# Patient Record
Sex: Female | Born: 1940
Health system: Southern US, Community
[De-identification: ages and names within clinical notes are randomized; demographics above are authoritative.]

## PROBLEM LIST (undated history)

## (undated) DIAGNOSIS — M1990 Unspecified osteoarthritis, unspecified site: Secondary | ICD-10-CM

## (undated) DIAGNOSIS — N841 Polyp of cervix uteri: Secondary | ICD-10-CM

## (undated) DIAGNOSIS — I10 Essential (primary) hypertension: Secondary | ICD-10-CM

## (undated) DIAGNOSIS — C189 Malignant neoplasm of colon, unspecified: Secondary | ICD-10-CM

## (undated) DIAGNOSIS — Z9289 Personal history of other medical treatment: Secondary | ICD-10-CM

## (undated) DIAGNOSIS — G629 Polyneuropathy, unspecified: Secondary | ICD-10-CM

## (undated) DIAGNOSIS — I5189 Other ill-defined heart diseases: Secondary | ICD-10-CM

## (undated) DIAGNOSIS — K458 Other specified abdominal hernia without obstruction or gangrene: Secondary | ICD-10-CM

## (undated) HISTORY — PX: COLECTOMY: SHX59

## (undated) HISTORY — PX: TONSILLECTOMY: SUR1361

## (undated) HISTORY — DX: Personal history of other medical treatment: Z92.89

## (undated) HISTORY — PX: SKIN CANCER EXCISION: SHX779

## (undated) HISTORY — PX: EYE SURGERY: SHX253

## (undated) HISTORY — DX: Polyp of cervix uteri: N84.1

## (undated) HISTORY — DX: Other ill-defined heart diseases: I51.89

## (undated) HISTORY — PX: OTHER SURGICAL HISTORY: SHX169

## (undated) HISTORY — DX: Malignant neoplasm of colon, unspecified: C18.9

## (undated) HISTORY — DX: Other specified abdominal hernia without obstruction or gangrene: K45.8

---

## 2007-04-26 DIAGNOSIS — C189 Malignant neoplasm of colon, unspecified: Secondary | ICD-10-CM

## 2007-04-26 HISTORY — DX: Malignant neoplasm of colon, unspecified: C18.9

## 2007-09-24 ENCOUNTER — Ambulatory Visit: Payer: Self-pay | Admitting: Internal Medicine

## 2007-10-05 ENCOUNTER — Other Ambulatory Visit: Payer: Self-pay

## 2007-10-06 ENCOUNTER — Inpatient Hospital Stay: Payer: Self-pay | Admitting: Vascular Surgery

## 2007-10-24 ENCOUNTER — Ambulatory Visit: Payer: Self-pay | Admitting: Internal Medicine

## 2007-10-26 ENCOUNTER — Inpatient Hospital Stay: Payer: Self-pay | Admitting: Vascular Surgery

## 2007-11-02 ENCOUNTER — Ambulatory Visit: Payer: Self-pay | Admitting: Internal Medicine

## 2007-11-13 ENCOUNTER — Ambulatory Visit: Payer: Self-pay | Admitting: Vascular Surgery

## 2007-11-24 ENCOUNTER — Ambulatory Visit: Payer: Self-pay | Admitting: Internal Medicine

## 2007-11-26 ENCOUNTER — Ambulatory Visit: Payer: Self-pay | Admitting: Gastroenterology

## 2007-11-29 ENCOUNTER — Ambulatory Visit: Payer: Self-pay | Admitting: Internal Medicine

## 2007-12-04 ENCOUNTER — Ambulatory Visit: Payer: Self-pay | Admitting: Internal Medicine

## 2007-12-25 ENCOUNTER — Ambulatory Visit: Payer: Self-pay | Admitting: Internal Medicine

## 2008-01-24 ENCOUNTER — Ambulatory Visit: Payer: Self-pay | Admitting: Internal Medicine

## 2008-02-24 ENCOUNTER — Ambulatory Visit: Payer: Self-pay | Admitting: Internal Medicine

## 2008-03-25 ENCOUNTER — Ambulatory Visit: Payer: Self-pay | Admitting: Gynecologic Oncology

## 2008-03-25 ENCOUNTER — Ambulatory Visit: Payer: Self-pay | Admitting: Internal Medicine

## 2008-04-01 ENCOUNTER — Ambulatory Visit: Payer: Self-pay | Admitting: Gynecologic Oncology

## 2008-04-02 ENCOUNTER — Ambulatory Visit: Payer: Self-pay | Admitting: Gynecologic Oncology

## 2008-04-25 ENCOUNTER — Ambulatory Visit: Payer: Self-pay | Admitting: Internal Medicine

## 2008-05-26 ENCOUNTER — Ambulatory Visit: Payer: Self-pay | Admitting: Internal Medicine

## 2008-06-23 ENCOUNTER — Ambulatory Visit: Payer: Self-pay | Admitting: Internal Medicine

## 2008-07-24 ENCOUNTER — Ambulatory Visit: Payer: Self-pay | Admitting: Internal Medicine

## 2008-08-23 ENCOUNTER — Ambulatory Visit: Payer: Self-pay | Admitting: Internal Medicine

## 2008-09-10 ENCOUNTER — Ambulatory Visit: Payer: Self-pay | Admitting: Vascular Surgery

## 2008-09-10 ENCOUNTER — Ambulatory Visit: Payer: Self-pay | Admitting: Cardiology

## 2008-09-17 ENCOUNTER — Inpatient Hospital Stay: Payer: Self-pay | Admitting: Vascular Surgery

## 2008-09-23 ENCOUNTER — Ambulatory Visit: Payer: Self-pay | Admitting: Internal Medicine

## 2008-10-23 ENCOUNTER — Ambulatory Visit: Payer: Self-pay | Admitting: Internal Medicine

## 2008-11-23 ENCOUNTER — Ambulatory Visit: Payer: Self-pay | Admitting: Internal Medicine

## 2008-12-15 ENCOUNTER — Ambulatory Visit: Payer: Self-pay | Admitting: Internal Medicine

## 2008-12-24 ENCOUNTER — Ambulatory Visit: Payer: Self-pay | Admitting: Internal Medicine

## 2009-01-23 ENCOUNTER — Ambulatory Visit: Payer: Self-pay | Admitting: Internal Medicine

## 2009-01-28 ENCOUNTER — Ambulatory Visit: Payer: Self-pay | Admitting: Gastroenterology

## 2009-02-23 ENCOUNTER — Ambulatory Visit: Payer: Self-pay | Admitting: Internal Medicine

## 2009-03-25 ENCOUNTER — Ambulatory Visit: Payer: Self-pay | Admitting: Internal Medicine

## 2009-04-25 ENCOUNTER — Ambulatory Visit: Payer: Self-pay | Admitting: Internal Medicine

## 2009-04-28 ENCOUNTER — Ambulatory Visit: Payer: Self-pay | Admitting: Internal Medicine

## 2009-05-02 ENCOUNTER — Ambulatory Visit: Payer: Self-pay | Admitting: Internal Medicine

## 2009-05-12 ENCOUNTER — Ambulatory Visit: Payer: Self-pay | Admitting: Gynecologic Oncology

## 2009-05-26 ENCOUNTER — Ambulatory Visit: Payer: Self-pay | Admitting: Internal Medicine

## 2009-06-16 ENCOUNTER — Ambulatory Visit: Payer: Self-pay | Admitting: Vascular Surgery

## 2009-06-23 ENCOUNTER — Ambulatory Visit: Payer: Self-pay | Admitting: Internal Medicine

## 2009-08-23 ENCOUNTER — Ambulatory Visit: Payer: Self-pay | Admitting: Internal Medicine

## 2009-08-31 ENCOUNTER — Ambulatory Visit: Payer: Self-pay | Admitting: Internal Medicine

## 2009-09-23 ENCOUNTER — Ambulatory Visit: Payer: Self-pay | Admitting: Internal Medicine

## 2009-12-10 ENCOUNTER — Ambulatory Visit: Payer: Self-pay | Admitting: Internal Medicine

## 2009-12-24 ENCOUNTER — Ambulatory Visit: Payer: Self-pay | Admitting: Internal Medicine

## 2010-01-23 ENCOUNTER — Ambulatory Visit: Payer: Self-pay | Admitting: Internal Medicine

## 2010-03-12 ENCOUNTER — Ambulatory Visit: Payer: Self-pay | Admitting: Internal Medicine

## 2010-03-14 LAB — CEA: CEA: 0.8 ng/mL (ref 0.0–4.7)

## 2010-03-25 ENCOUNTER — Ambulatory Visit: Payer: Self-pay | Admitting: Internal Medicine

## 2010-06-14 ENCOUNTER — Ambulatory Visit: Payer: Self-pay | Admitting: Internal Medicine

## 2010-06-15 LAB — CEA: CEA: 0.7 ng/mL (ref 0.0–4.7)

## 2010-06-24 ENCOUNTER — Ambulatory Visit: Payer: Self-pay | Admitting: Internal Medicine

## 2010-09-13 ENCOUNTER — Ambulatory Visit: Payer: Self-pay | Admitting: Internal Medicine

## 2010-09-14 LAB — CEA: CEA: 1 ng/mL (ref 0.0–4.7)

## 2010-09-24 ENCOUNTER — Ambulatory Visit: Payer: Self-pay | Admitting: Internal Medicine

## 2010-11-16 LAB — HM PAP SMEAR: HM Pap smear: NORMAL

## 2010-12-15 ENCOUNTER — Ambulatory Visit: Payer: Self-pay | Admitting: Internal Medicine

## 2010-12-16 LAB — CEA: CEA: 1 ng/mL (ref 0.0–4.7)

## 2010-12-17 ENCOUNTER — Encounter: Payer: Self-pay | Admitting: Internal Medicine

## 2010-12-17 ENCOUNTER — Ambulatory Visit (INDEPENDENT_AMBULATORY_CARE_PROVIDER_SITE_OTHER): Payer: Medicare Other | Admitting: Internal Medicine

## 2010-12-17 VITALS — BP 157/100 | HR 66 | Temp 97.9°F | Resp 16 | Ht 67.0 in | Wt 189.5 lb

## 2010-12-17 DIAGNOSIS — J329 Chronic sinusitis, unspecified: Secondary | ICD-10-CM

## 2010-12-17 DIAGNOSIS — I10 Essential (primary) hypertension: Secondary | ICD-10-CM

## 2010-12-17 MED ORDER — ENALAPRIL MALEATE 5 MG PO TABS: 5.0000 mg | ORAL_TABLET | Freq: Every day | ORAL | Status: DC

## 2010-12-17 MED ORDER — AMOXICILLIN-POT CLAVULANATE 875-125 MG PO TABS: 1.0000 | ORAL_TABLET | Freq: Two times a day (BID) | ORAL | Status: AC

## 2010-12-17 MED ORDER — BLOOD PRESSURE KIT: 1.0000 mL | PACK | Status: DC | PRN

## 2010-12-17 NOTE — Patient Instructions (Signed)
DASH diet.   Hypertension (High Blood Pressure) As your heart beats, it forces blood through your arteries. This force is your blood pressure. If the pressure is too high, it is called hypertension (HTN) or high blood pressure. HTN is dangerous because you may have it and not know it. High blood pressure may mean that your heart has to work harder to pump blood. Your arteries may be narrow or stiff. The extra work puts you at risk for heart disease, stroke, and other problems.  Blood pressure consists of two numbers, a higher number over a lower, 110/72, for example. It is stated as "110 over 72." The ideal is below 120 for the top number (systolic) and under 80 for the bottom (diastolic). Write down your blood pressure today. You should pay close attention to your blood pressure if you have certain conditions such as:  Heart failure.  Prior heart attack.   Diabetes   Chronic kidney disease.   Prior stroke.   Multiple risk factors for heart disease.   To see if you have HTN, your blood pressure should be measured while you are seated with your arm held at the level of the heart. It should be measured at least twice. A one-time elevated blood pressure reading (especially in the Emergency Department) does not mean that you need treatment. There may be conditions in which the blood pressure is different between your right and left arms. It is important to see your caregiver soon for a recheck. Most people have essential hypertension which means that there is not a specific cause. This type of high blood pressure may be lowered by changing lifestyle factors such as:  Stress.  Smoking.   Lack of exercise.   Excessive weight.  Drug/tobacco/alcohol use.   Eating less salt.   Most people do not have symptoms from high blood pressure until it has caused damage to the body. Effective treatment can often prevent, delay or reduce that damage. TREATMENT Treatment for high blood pressure, when a  cause has been identified, is directed at the cause. There are a large number of medications to treat HTN. These fall into several categories, and your caregiver will help you select the medicines that are best for you. Medications may have side effects. You should review side effects with your caregiver. If your blood pressure stays high after you have made lifestyle changes or started on medicines,   Your medication(s) may need to be changed.   Other problems may need to be addressed.   Be certain you understand your prescriptions, and know how and when to take your medicine.   Be sure to follow up with your caregiver within the time frame advised (usually within two weeks) to have your blood pressure rechecked and to review your medications.   If you are taking more than one medicine to lower your blood pressure, make sure you know how and at what times they should be taken. Taking two medicines at the same time can result in blood pressure that is too low.  SEEK IMMEDIATE MEDICAL CARE IF YOU DEVELOP:  A severe headache, blurred or changing vision, or confusion.   Unusual weakness or numbness, or a faint feeling.   Severe chest or abdominal pain, vomiting, or breathing problems.  MAKE SURE YOU:   Understand these instructions.   Will watch your condition.   Will get help right away if you are not doing well or get worse.  Document Released: 04/11/2005 Document Re-Released: 09/29/2009 [employer]  Patient Information 2011 [place], [place].

## 2010-12-18 ENCOUNTER — Encounter: Payer: Self-pay | Admitting: Internal Medicine

## 2010-12-18 DIAGNOSIS — I10 Essential (primary) hypertension: Secondary | ICD-10-CM | POA: Insufficient documentation

## 2010-12-18 NOTE — Progress Notes (Signed)
Subjective:    Patient ID: Sabrina Wilson, female    DOB: 12/12/1940, 70 y.o.   MRN: [id number]  Hypertension This is a new problem. The current episode started 1 to 4 weeks ago. The problem is unchanged. The problem is uncontrolled. Pertinent negatives include no anxiety, blurred vision, chest pain, headaches, malaise/fatigue, neck pain, palpitations, peripheral edema or shortness of breath. Associated agents: however has past exposure to chemotherapy. Risk factors for coronary artery disease include dyslipidemia and post-menopausal state. Past treatments include nothing. There are no compliance problems.   Sinus Problem This is a new problem. The current episode started 1 to 4 weeks ago. The problem is unchanged. There has been no fever. The pain is moderate. Associated symptoms include congestion and sinus pressure (and foul smell from nose). Pertinent negatives include no chills, coughing, ear pain, headaches, neck pain, shortness of breath or sore throat. (Foul smell noted, described as "burnt toast") Past treatments include nothing.     No outpatient prescriptions prior to visit.    Review of Systems  Constitutional: Negative for fever, chills, malaise/fatigue, appetite change, fatigue and unexpected weight change.  HENT: Positive for congestion, rhinorrhea and sinus pressure (and foul smell from nose). Negative for ear pain, sore throat, trouble swallowing, neck pain and voice change.   Eyes: Negative for blurred vision and visual disturbance.  Respiratory: Negative for cough, chest tightness, shortness of breath, wheezing and stridor.   Cardiovascular: Negative for chest pain, palpitations and leg swelling.  Gastrointestinal: Negative for nausea, vomiting, abdominal pain, diarrhea, constipation, blood in stool, abdominal distention and anal bleeding.  Genitourinary: Negative for dysuria and flank pain.  Musculoskeletal: Negative for myalgias, arthralgias and gait problem.  Skin:  Negative for color change and rash.  Neurological: Negative for dizziness and headaches.  Hematological: Negative for adenopathy. Does not bruise/bleed easily.  Psychiatric/Behavioral: Negative for suicidal ideas, sleep disturbance and dysphoric mood. The patient is not nervous/anxious.       BP 157/100  Pulse 66  Temp(Src) 97.9 F (36.6 C) (Oral)  Resp 16  Ht 5\' 7"  (1.702 m)  Wt 189 lb 8 oz (85.957 kg)  BMI 29.68 kg/m2  SpO2 99%     Objective:   Physical Exam  Constitutional: She is oriented to person, place, and time. She appears well-developed and well-nourished. No distress.  HENT:  Head: Normocephalic and atraumatic.  Right Ear: Hearing, tympanic membrane, external ear and ear canal normal.  Left Ear: Hearing, tympanic membrane, external ear and ear canal normal.  Nose: Right sinus exhibits frontal sinus tenderness. Left sinus exhibits frontal sinus tenderness.  Mouth/Throat: Oropharynx is clear and moist. No oropharyngeal exudate.  Eyes: Conjunctivae and EOM are normal. Pupils are equal, round, and reactive to light. Right eye exhibits no discharge. Left eye exhibits no discharge. No scleral icterus.  Neck: Trachea normal, normal range of motion and full passive range of motion without pain. Neck supple. Carotid bruit is not present. No tracheal deviation present. No thyromegaly present.  Cardiovascular: Normal rate, regular rhythm, normal heart sounds and intact distal pulses.  Exam reveals no gallop and no friction rub.   No murmur heard. Pulmonary/Chest: Effort normal and breath sounds normal. No respiratory distress. She has no wheezes. She has no rales. She exhibits no tenderness.  Musculoskeletal: Normal range of motion. She exhibits no edema and no tenderness.  Lymphadenopathy:    She has no cervical adenopathy.  Neurological: She is alert and oriented to person, place, and time. No cranial nerve deficit.  She exhibits normal muscle tone. Coordination normal.  Skin:  Skin is warm and dry. No rash noted. She is not diaphoretic. No erythema. No pallor.  Psychiatric: She has a normal mood and affect. Her behavior is normal. Judgment and thought content normal.          Assessment & Plan:  1. Hypertension - patient with a history of elevated blood pressure on several occasions. She notes that in the interval since her last visit she had markedly elevated blood pressure approximately 180/90 while at a dentist office. We'll plan to initiate therapy with enalapril. We discussed the potential risk of enalapril including cough. She will need to have creatinine and potassium checked in one week. She will call if she develops any problems with the medication. We will have her return to clinic in one week for a blood pressure check, and in one month for general followup.  2. Sinusitis - congestion, sinus pressure, and foul-smelling discharge are consistent with frontal sinusitis. Will treat with Augmentin x10 days and tylenol as needed for pain. She will call back in 5-7 days to let us know how her symptoms are progressing. Should she have no improvement on this regimen then we discussed referral to ENT.

## 2010-12-25 ENCOUNTER — Ambulatory Visit: Payer: Self-pay | Admitting: Internal Medicine

## 2011-01-19 ENCOUNTER — Ambulatory Visit (INDEPENDENT_AMBULATORY_CARE_PROVIDER_SITE_OTHER): Payer: Medicare Other | Admitting: Internal Medicine

## 2011-01-19 ENCOUNTER — Encounter: Payer: Self-pay | Admitting: Internal Medicine

## 2011-01-19 VITALS — BP 147/84 | HR 68 | Temp 98.4°F | Resp 16 | Ht 67.0 in | Wt 191.0 lb

## 2011-01-19 DIAGNOSIS — I10 Essential (primary) hypertension: Secondary | ICD-10-CM

## 2011-01-19 DIAGNOSIS — Z0000 Encounter for general adult medical examination without abnormal findings: Secondary | ICD-10-CM

## 2011-01-19 DIAGNOSIS — Z23 Encounter for immunization: Secondary | ICD-10-CM

## 2011-01-19 MED ORDER — ENALAPRIL MALEATE 5 MG PO TABS: 10.0000 mg | ORAL_TABLET | Freq: Every day | ORAL | Status: DC

## 2011-01-19 MED ORDER — ZOSTER VACCINE LIVE 19400 UNT/0.65ML ~~LOC~~ SOLR: 0.6500 mL | Freq: Once | SUBCUTANEOUS | Status: DC

## 2011-01-19 NOTE — Progress Notes (Signed)
  Subjective:    Patient ID: Sabrina Wilson, female    DOB: [birth year], 70 y.o.   MRN: [id number]  HPI Sabrina Wilson is a 70 year old female with a recent diagnosis of hypertension who presents for followup. At her last visit one month ago she was started on enalapril. She reports no problems with this medication. She has been monitoring her blood pressure at home and blood pressure has been running 140s to 160s over 70s to 90s for the most part. She notes however that she has been using a blood pressure cuff which is too small for her arm and this was corrected today. She denies any chest pain, palpitations, shortness of breath. She denies any cough with using the enalapril. She denies any other complaints today.  Outpatient Encounter Prescriptions as of 01/19/2011  Medication Sig Dispense Refill  . enalapril (VASOTEC) 5 MG tablet Take 2 tablets (10 mg total) by mouth daily.  60 tablet  11  . fish oil-omega-3 fatty acids 1000 MG capsule Take 2 g by mouth daily.        Marland Kitchen VITAMIN D, ERGOCALCIFEROL, PO 2 tablets daily.        Marland Kitchen DISCONTD: enalapril (VASOTEC) 5 MG tablet Take 1 tablet (5 mg total) by mouth daily.  30 tablet  11    Review of Systems  Constitutional: Negative for fever, chills, diaphoresis and fatigue.  Respiratory: Negative for cough.   Cardiovascular: Negative for chest pain, palpitations and leg swelling.  Gastrointestinal: Negative for abdominal pain.   BP 147/84  Pulse 68  Temp(Src) 98.4 F (36.9 C) (Oral)  Resp 16  Ht 5\' 7"  (1.702 m)  Wt 191 lb (86.637 kg)  BMI 29.91 kg/m2  SpO2 97%     Objective:   Physical Exam  Constitutional: She is oriented to person, place, and time. She appears well-developed and well-nourished. No distress.  HENT:  Head: Normocephalic and atraumatic.  Right Ear: External ear normal.  Left Ear: External ear normal.  Nose: Nose normal.  Mouth/Throat: Oropharynx is clear and moist. No oropharyngeal exudate.  Eyes: Conjunctivae are normal.  Pupils are equal, round, and reactive to light. Right eye exhibits no discharge. Left eye exhibits no discharge. No scleral icterus.  Neck: Normal range of motion. Neck supple. No tracheal deviation present. No thyromegaly present.  Cardiovascular: Normal rate, regular rhythm, normal heart sounds and intact distal pulses.  Exam reveals no gallop and no friction rub.   No murmur heard. Pulmonary/Chest: Effort normal and breath sounds normal. No respiratory distress. She has no wheezes. She has no rales. She exhibits no tenderness.  Musculoskeletal: Normal range of motion. She exhibits no edema and no tenderness.  Lymphadenopathy:    She has no cervical adenopathy.  Neurological: She is alert and oriented to person, place, and time. No cranial nerve deficit. She exhibits normal muscle tone. Coordination normal.  Skin: Skin is warm and dry. No rash noted. She is not diaphoretic. No erythema. No pallor.  Psychiatric: She has a normal mood and affect. Her behavior is normal. Judgment and thought content normal.          Assessment & Plan:

## 2011-01-19 NOTE — Patient Instructions (Signed)
Increase enalapril to 10mg  daily. Follow up in 1 month.

## 2011-01-19 NOTE — Assessment & Plan Note (Signed)
   Assessment:  BP persistently elevated on Enalapril 5mg  daily.    Plan:  Increase enalapril to 10mg  daily. Check Cr and K with labs today. Patient Education: Reviewed risks of hypertension and principles of  treatment.

## 2011-01-21 ENCOUNTER — Encounter: Payer: Self-pay | Admitting: Internal Medicine

## 2011-01-21 LAB — COMPREHENSIVE METABOLIC PANEL
ALT: 15 U/L (ref 0–35)
AST: 19 U/L (ref 0–37)
Alkaline Phosphatase: 55 U/L (ref 39–117)
Creatinine, Ser: 0.9 mg/dL (ref 0.4–1.2)
Total Bilirubin: 0.6 mg/dL (ref 0.3–1.2)

## 2011-01-27 ENCOUNTER — Other Ambulatory Visit: Payer: Self-pay | Admitting: Internal Medicine

## 2011-01-27 MED ORDER — ZOSTER VACCINE LIVE 19400 UNT/0.65ML ~~LOC~~ SOLR: 0.6500 mL | Freq: Once | SUBCUTANEOUS | Status: DC

## 2011-02-01 ENCOUNTER — Ambulatory Visit: Payer: Self-pay | Admitting: Internal Medicine

## 2011-02-07 ENCOUNTER — Ambulatory Visit: Payer: Self-pay | Admitting: Gastroenterology

## 2011-02-10 LAB — PATHOLOGY REPORT

## 2011-02-28 ENCOUNTER — Encounter: Payer: Self-pay | Admitting: Internal Medicine

## 2011-02-28 ENCOUNTER — Ambulatory Visit (INDEPENDENT_AMBULATORY_CARE_PROVIDER_SITE_OTHER): Payer: Medicare Other | Admitting: Internal Medicine

## 2011-02-28 ENCOUNTER — Telehealth: Payer: Self-pay | Admitting: Internal Medicine

## 2011-02-28 VITALS — BP 138/82 | HR 64 | Temp 98.3°F | Wt 190.0 lb

## 2011-02-28 DIAGNOSIS — I10 Essential (primary) hypertension: Secondary | ICD-10-CM

## 2011-02-28 DIAGNOSIS — J309 Allergic rhinitis, unspecified: Secondary | ICD-10-CM

## 2011-02-28 NOTE — Progress Notes (Signed)
Subjective:    Patient ID: Sabrina Wilson, female    DOB: [birth year], 70 y.o.   MRN: [id number]  HPI  70 year old female with a history of hypertension presents for followup. She notes that her blood pressure has been well controlled on the higher dose of enalapril at 10 mg daily. She does know 1 outlier at her dentist office where her blood pressure was elevated up to 170/90. Aside from this, she reports her blood pressure has been well-controlled. She denies any chest pain, headache, palpitations or other symptoms. She reports full compliance with her medication.  Over the last 2 months, she notes some increased nasal drainage and postnasal drip. She attributes this to allergies. She reports occasional sneezing. She denies any fever or chills. She reports drainage in the back of her throat which seems difficult to clear. She denies any shortness of breath. She does have occasional dry cough.  Outpatient Encounter Prescriptions as of 02/28/2011  Medication Sig Dispense Refill  . enalapril (VASOTEC) 5 MG tablet Take 2 tablets (10 mg total) by mouth daily.  60 tablet  11  . fish oil-omega-3 fatty acids 1000 MG capsule Take 2 g by mouth daily.        Marland Kitchen VITAMIN D, ERGOCALCIFEROL, PO 2 tablets daily.          Review of Systems  Constitutional: Negative for fever, chills, fatigue and unexpected weight change.  HENT: Positive for rhinorrhea, sneezing and postnasal drip. Negative for ear pain, congestion, sore throat, facial swelling, mouth sores, trouble swallowing, voice change and sinus pressure.   Eyes: Negative for pain, discharge, redness and visual disturbance.  Respiratory: Positive for cough. Negative for chest tightness, shortness of breath, wheezing and stridor.   Cardiovascular: Negative for chest pain, palpitations and leg swelling.  Musculoskeletal: Negative for myalgias and arthralgias.  Skin: Negative for color change and rash.  Neurological: Negative for dizziness, weakness,  light-headedness and headaches.  Hematological: Negative for adenopathy.   BP 138/82  Pulse 64  Temp(Src) 98.3 F (36.8 C) (Oral)  Wt 190 lb (86.183 kg)  SpO2 97%     Objective:   Physical Exam  Constitutional: She is oriented to person, place, and time. She appears well-developed and well-nourished. No distress.  HENT:  Head: Normocephalic and atraumatic.  Right Ear: Tympanic membrane and external ear normal.  Left Ear: Tympanic membrane and external ear normal.  Nose: Nose normal.  Mouth/Throat: Oropharynx is clear and moist. No oropharyngeal exudate.  Eyes: Conjunctivae are normal. Pupils are equal, round, and reactive to light. Right eye exhibits no discharge. Left eye exhibits no discharge. No scleral icterus.  Neck: Normal range of motion. Neck supple. No tracheal deviation present. No thyromegaly present.  Cardiovascular: Normal rate, regular rhythm, normal heart sounds and intact distal pulses.  Exam reveals no gallop and no friction rub.   No murmur heard. Pulmonary/Chest: Effort normal and breath sounds normal. No respiratory distress. She has no wheezes. She has no rales. She exhibits no tenderness.  Musculoskeletal: Normal range of motion. She exhibits no edema and no tenderness.  Lymphadenopathy:    She has no cervical adenopathy.  Neurological: She is alert and oriented to person, place, and time. No cranial nerve deficit. She exhibits normal muscle tone. Coordination normal.  Skin: Skin is warm and dry. No rash noted. She is not diaphoretic. No erythema. No pallor.  Psychiatric: She has a normal mood and affect. Her behavior is normal. Judgment and thought content normal.  Assessment & Plan:  1. Hypertension - BP well controlled today. She reports an elevated BP reading at dentist's office, but this was outlier. Suspect related to anxiety over procedure. Will continue to monitor BP. Follow up 1 month.  2. Allergic rhinitis - Will start Claritin and  Veramyst nasal steroid. Pt will call if symptoms not improving. If no improvement, will consider changing enalapril to Losartan as enalapril can cause chronic cough.

## 2011-02-28 NOTE — Telephone Encounter (Signed)
I was thinking more about patient's symptoms as I was writing my note.  Although her symptoms seem most consistent with allergic rhinitis, the time course of 1-2 month history of cough/nasal drainage would also fit for allergy to enalapril. We could try changing her enalapril to Losartan 50mg  daily to see if any improvement in her symptoms.

## 2011-02-28 NOTE — Patient Instructions (Signed)
Start Claritin 10mg  daily.  Start Veramyst sample, 1 spray each nostril at bedtime. Call if no improvement over next week.

## 2011-03-01 MED ORDER — LOSARTAN POTASSIUM 50 MG PO TABS: 50.0000 mg | ORAL_TABLET | Freq: Every day | ORAL | Status: DC

## 2011-03-01 NOTE — Telephone Encounter (Signed)
Left mess to call office back.   

## 2011-03-01 NOTE — Telephone Encounter (Signed)
Patient informed and willing to try new med. RX sent into pharm.

## 2011-03-14 ENCOUNTER — Encounter: Payer: Self-pay | Admitting: Internal Medicine

## 2011-03-21 ENCOUNTER — Ambulatory Visit: Payer: Self-pay | Admitting: Internal Medicine

## 2011-03-22 LAB — CEA: CEA: 0.9 ng/mL (ref 0.0–4.7)

## 2011-03-26 ENCOUNTER — Ambulatory Visit: Payer: Self-pay | Admitting: Internal Medicine

## 2011-04-01 ENCOUNTER — Ambulatory Visit (INDEPENDENT_AMBULATORY_CARE_PROVIDER_SITE_OTHER): Payer: Medicare Other | Admitting: Internal Medicine

## 2011-04-01 ENCOUNTER — Encounter: Payer: Self-pay | Admitting: Internal Medicine

## 2011-04-01 ENCOUNTER — Telehealth: Payer: Self-pay | Admitting: Internal Medicine

## 2011-04-01 VITALS — BP 161/94 | HR 74 | Temp 98.6°F | Wt 190.0 lb

## 2011-04-01 DIAGNOSIS — I10 Essential (primary) hypertension: Secondary | ICD-10-CM

## 2011-04-01 DIAGNOSIS — H6690 Otitis media, unspecified, unspecified ear: Secondary | ICD-10-CM

## 2011-04-01 DIAGNOSIS — H6692 Otitis media, unspecified, left ear: Secondary | ICD-10-CM

## 2011-04-01 MED ORDER — AMOXICILLIN-POT CLAVULANATE 875-125 MG PO TABS: 1.0000 | ORAL_TABLET | Freq: Two times a day (BID) | ORAL | Status: AC

## 2011-04-01 MED ORDER — LOSARTAN POTASSIUM 100 MG PO TABS: 100.0000 mg | ORAL_TABLET | Freq: Every day | ORAL | Status: DC

## 2011-04-01 NOTE — Progress Notes (Signed)
Subjective:    Patient ID: Sabrina Wilson, female    DOB: [birth year], 70 y.o.   MRN: [id number]  HPI 70 year old female with a history of hypertension presents for followup. She reports full compliance with her losartan. She denies any chest pain, headache, palpitations. She has not been regularly checking her blood pressure.  She is also concerned today about approximately one week history of nasal congestion and ear pain. She denies any fever or chills. She reports that the drainage from her sinuses is purulent. She has occasional nonproductive cough. She denies any shortness of breath.  Outpatient Encounter Prescriptions as of 04/01/2011  Medication Sig Dispense Refill  . fish oil-omega-3 fatty acids 1000 MG capsule Take 2 g by mouth daily.        Marland Kitchen losartan (COZAAR) 100 MG tablet Take 1 tablet (100 mg total) by mouth daily.  30 tablet  6  . VITAMIN D, ERGOCALCIFEROL, PO 2 tablets daily.          Review of Systems  Constitutional: Negative for fever, chills, appetite change, fatigue and unexpected weight change.  HENT: Positive for ear pain and congestion. Negative for sore throat, trouble swallowing, neck pain, voice change and sinus pressure.   Eyes: Negative for visual disturbance.  Respiratory: Positive for cough. Negative for shortness of breath and wheezing.   Cardiovascular: Negative for chest pain, palpitations and leg swelling.  Genitourinary: Negative for dysuria and flank pain.  Musculoskeletal: Negative for myalgias, arthralgias and gait problem.  Skin: Negative for color change and rash.  Neurological: Negative for dizziness and headaches.  Hematological: Negative for adenopathy. Does not bruise/bleed easily.   BP 161/94  Pulse 74  Temp 98.6 F (37 C)  Wt 190 lb (86.183 kg)  SpO2 100%     Objective:   Physical Exam  Constitutional: She is oriented to person, place, and time. She appears well-developed and well-nourished. No distress.  HENT:  Head:  Normocephalic and atraumatic.  Right Ear: External ear normal. Tympanic membrane is erythematous. A middle ear effusion is present.  Left Ear: External ear normal. Tympanic membrane is erythematous. A middle ear effusion is present.  Nose: Nose normal.  Mouth/Throat: Oropharynx is clear and moist. No oropharyngeal exudate.  Eyes: Conjunctivae are normal. Pupils are equal, round, and reactive to light. Right eye exhibits no discharge. Left eye exhibits no discharge. No scleral icterus.  Neck: Normal range of motion. Neck supple. No tracheal deviation present. No thyromegaly present.  Cardiovascular: Normal rate, regular rhythm, normal heart sounds and intact distal pulses.  Exam reveals no gallop and no friction rub.   No murmur heard. Pulmonary/Chest: Effort normal and breath sounds normal. No respiratory distress. She has no wheezes. She has no rales. She exhibits no tenderness.  Musculoskeletal: Normal range of motion. She exhibits no edema and no tenderness.  Lymphadenopathy:    She has no cervical adenopathy.  Neurological: She is alert and oriented to person, place, and time. No cranial nerve deficit. She exhibits normal muscle tone. Coordination normal.  Skin: Skin is warm and dry. No rash noted. She is not diaphoretic. No erythema. No pallor.  Psychiatric: She has a normal mood and affect. Her behavior is normal. Judgment and thought content normal.          Assessment & Plan:  1. Hypertension - blood pressure continues to be elevated. Will increase valsartan to 100 mg daily. We'll have her return to clinic in one month for repeat blood pressure check.  2. Otitis  media - symptoms and exam are consistent with otitis media. Will treat with a ten-day course of Augmentin. She will use ibuprofen or Tylenol as needed for pain. She will call or return to clinic should fever develop or should symptoms worsen. Otherwise, she will followup in one month.

## 2011-04-01 NOTE — Telephone Encounter (Signed)
Given that we are increasing losartan to 100mg  daily, I would like to check a BMP next week.  Can you ask her to drop by for this? Thanks

## 2011-04-04 NOTE — Telephone Encounter (Signed)
Left message asking patient to return my call.

## 2011-04-06 NOTE — Telephone Encounter (Signed)
Patient notified. She will come in on Monday for labs because she is tied up for the rest of this week.

## 2011-04-11 ENCOUNTER — Other Ambulatory Visit (INDEPENDENT_AMBULATORY_CARE_PROVIDER_SITE_OTHER): Payer: Medicare Other | Admitting: *Deleted

## 2011-04-11 DIAGNOSIS — I10 Essential (primary) hypertension: Secondary | ICD-10-CM

## 2011-04-12 LAB — BASIC METABOLIC PANEL
BUN: 18 mg/dL (ref 6–23)
CO2: 25 mEq/L (ref 19–32)
Chloride: 106 mEq/L (ref 96–112)
Creatinine, Ser: 1 mg/dL (ref 0.4–1.2)

## 2011-04-26 ENCOUNTER — Ambulatory Visit: Payer: Self-pay | Admitting: Internal Medicine

## 2011-05-04 ENCOUNTER — Ambulatory Visit: Payer: Medicare Other | Admitting: Internal Medicine

## 2011-05-12 ENCOUNTER — Ambulatory Visit (INDEPENDENT_AMBULATORY_CARE_PROVIDER_SITE_OTHER): Payer: Medicare Other | Admitting: Internal Medicine

## 2011-05-12 ENCOUNTER — Encounter: Payer: Self-pay | Admitting: Internal Medicine

## 2011-05-12 VITALS — BP 138/62 | HR 86 | Temp 98.0°F | Ht 67.0 in | Wt 190.0 lb

## 2011-05-12 DIAGNOSIS — I10 Essential (primary) hypertension: Secondary | ICD-10-CM

## 2011-05-12 NOTE — Assessment & Plan Note (Signed)
No further cough with change to Losartan. BP well controlled. Will repeat renal function and follow up in 6 months.

## 2011-05-12 NOTE — Progress Notes (Signed)
  Subjective:    Patient ID: Sabrina Wilson, female    DOB: 1941/01/07, 71 y.o.   MRN: [id number]  HPI 71YO female with h/o HTN presents for follow up. She reports she is feeling very well. She reports that her cough has resolved with change to losartan. She reports full compliance with her medication. She denies any complaints today.  In regards to her recent episode of left otitis media, she reports symptoms have completely resolved with treatment with augmentin. She has not had recurrent ear pain, fever, or other symptoms.  Outpatient Encounter Prescriptions as of 05/12/2011  Medication Sig Dispense Refill  . fish oil-omega-3 fatty acids 1000 MG capsule Take 2 g by mouth daily.        Marland Kitchen losartan (COZAAR) 100 MG tablet Take 1 tablet (100 mg total) by mouth daily.  30 tablet  6  . VITAMIN D, ERGOCALCIFEROL, PO 2 tablets daily.          Review of Systems  Constitutional: Negative for fever, chills, appetite change, fatigue and unexpected weight change.  HENT: Negative for ear pain, congestion, sore throat, trouble swallowing, neck pain, voice change and sinus pressure.   Eyes: Negative for visual disturbance.  Respiratory: Negative for cough, shortness of breath, wheezing and stridor.   Cardiovascular: Negative for chest pain, palpitations and leg swelling.  Gastrointestinal: Negative for nausea, vomiting, abdominal pain, diarrhea, constipation, blood in stool, abdominal distention and anal bleeding.  Genitourinary: Negative for dysuria and flank pain.  Musculoskeletal: Negative for myalgias, arthralgias and gait problem.  Skin: Negative for color change and rash.  Neurological: Negative for dizziness and headaches.  Hematological: Negative for adenopathy. Does not bruise/bleed easily.  Psychiatric/Behavioral: Negative for suicidal ideas, sleep disturbance and dysphoric mood. The patient is not nervous/anxious.    BP 138/62  Pulse 86  Temp(Src) 98 F (36.7 C) (Oral)  Ht 5\' 7"  (1.702  m)  Wt 190 lb (86.183 kg)  BMI 29.76 kg/m2  SpO2 97%     Objective:   Physical Exam  Constitutional: She is oriented to person, place, and time. She appears well-developed and well-nourished. No distress.  HENT:  Head: Normocephalic and atraumatic.  Right Ear: External ear normal.  Left Ear: External ear normal.  Nose: Nose normal.  Mouth/Throat: Oropharynx is clear and moist. No oropharyngeal exudate.  Eyes: Conjunctivae are normal. Pupils are equal, round, and reactive to light. Right eye exhibits no discharge. Left eye exhibits no discharge. No scleral icterus.  Neck: Normal range of motion. Neck supple. No tracheal deviation present. No thyromegaly present.  Cardiovascular: Normal rate, regular rhythm, normal heart sounds and intact distal pulses.  Exam reveals no gallop and no friction rub.   No murmur heard. Pulmonary/Chest: Effort normal and breath sounds normal. No respiratory distress. She has no wheezes. She has no rales. She exhibits no tenderness.  Musculoskeletal: Normal range of motion. She exhibits no edema and no tenderness.  Lymphadenopathy:    She has no cervical adenopathy.  Neurological: She is alert and oriented to person, place, and time. No cranial nerve deficit. She exhibits normal muscle tone. Coordination normal.  Skin: Skin is warm and dry. No rash noted. She is not diaphoretic. No erythema. No pallor.  Psychiatric: She has a normal mood and affect. Her behavior is normal. Judgment and thought content normal.          Assessment & Plan:

## 2011-06-22 ENCOUNTER — Ambulatory Visit: Payer: Self-pay | Admitting: Internal Medicine

## 2011-06-22 LAB — CREATININE, SERUM
Creatinine: 1.03 mg/dL (ref 0.60–1.30)
EGFR (African American): >60
EGFR (Non-African Amer.): 57 — ABNORMAL LOW

## 2011-06-22 LAB — HEPATIC FUNCTION PANEL A (ARMC)
Alkaline Phosphatase: 72 U/L (ref 50–136)
SGOT(AST): 14 U/L — ABNORMAL LOW (ref 15–37)
SGPT (ALT): 24 U/L
Total Protein: 7.5 g/dL (ref 6.4–8.2)

## 2011-06-24 ENCOUNTER — Ambulatory Visit: Payer: Self-pay | Admitting: Internal Medicine

## 2011-06-24 ENCOUNTER — Ambulatory Visit: Payer: Self-pay | Admitting: Gynecologic Oncology

## 2011-07-08 LAB — CA 125: CA 125: 27.9 U/mL (ref 0.0–34.0)

## 2011-07-20 LAB — CA 125: CA 125: 26.8 U/mL (ref 0.0–34.0)

## 2011-07-25 ENCOUNTER — Ambulatory Visit: Payer: Self-pay | Admitting: Internal Medicine

## 2011-07-25 ENCOUNTER — Ambulatory Visit: Payer: Self-pay | Admitting: Gynecologic Oncology

## 2011-09-16 ENCOUNTER — Ambulatory Visit: Payer: Self-pay | Admitting: Gynecologic Oncology

## 2011-09-20 ENCOUNTER — Ambulatory Visit: Payer: Self-pay | Admitting: Internal Medicine

## 2011-09-24 ENCOUNTER — Ambulatory Visit: Payer: Self-pay | Admitting: Internal Medicine

## 2011-11-03 ENCOUNTER — Ambulatory Visit: Payer: Self-pay | Admitting: Internal Medicine

## 2011-11-04 ENCOUNTER — Other Ambulatory Visit: Payer: Self-pay | Admitting: Internal Medicine

## 2011-11-04 LAB — CEA: CEA: 1 ng/mL (ref 0.0–4.7)

## 2011-11-05 ENCOUNTER — Other Ambulatory Visit: Payer: Self-pay | Admitting: Internal Medicine

## 2011-11-11 ENCOUNTER — Encounter: Payer: Self-pay | Admitting: Internal Medicine

## 2011-11-11 ENCOUNTER — Ambulatory Visit (INDEPENDENT_AMBULATORY_CARE_PROVIDER_SITE_OTHER): Payer: Medicare Other | Admitting: Internal Medicine

## 2011-11-11 VITALS — BP 140/90 | HR 73 | Temp 98.3°F | Ht 67.0 in | Wt 190.8 lb

## 2011-11-11 DIAGNOSIS — Z85038 Personal history of other malignant neoplasm of large intestine: Secondary | ICD-10-CM | POA: Insufficient documentation

## 2011-11-11 DIAGNOSIS — I10 Essential (primary) hypertension: Secondary | ICD-10-CM

## 2011-11-11 NOTE — Progress Notes (Signed)
Subjective:    Patient ID: Sabrina Wilson, female    DOB: 08-21-1940, 71 y.o.   MRN: [id number]  HPI 71 year old female with history of colon and cervical cancer as well as hypertension presents for followup. She reports she was recently seen by her oncologist and imaging and blood work were negative for recurrence of cancer. She reports she has been feeling well. She reports her blood pressure has been well controlled at home typically. She reports full compliance with her medications. She denies any chest pain, palpitations, headache.  Outpatient Encounter Prescriptions as of 11/11/2011  Medication Sig Dispense Refill  . fish oil-omega-3 fatty acids 1000 MG capsule Take 2 g by mouth daily.        Marland Kitchen losartan (COZAAR) 100 MG tablet TAKE 1 TABLET (100 MG TOTAL) BY MOUTH DAILY.  30 tablet  6  . VITAMIN D, ERGOCALCIFEROL, PO 2 tablets daily.        Marland Kitchen DISCONTD: losartan (COZAAR) 100 MG tablet TAKE 1 TABLET (100 MG TOTAL) BY MOUTH DAILY.  30 tablet  5    Review of Systems  Constitutional: Negative for fever, chills, appetite change, fatigue and unexpected weight change.  HENT: Negative for ear pain, congestion, sore throat, trouble swallowing, neck pain, voice change and sinus pressure.   Eyes: Negative for visual disturbance.  Respiratory: Negative for cough, shortness of breath, wheezing and stridor.   Cardiovascular: Negative for chest pain, palpitations and leg swelling.  Gastrointestinal: Negative for nausea, vomiting, abdominal pain, diarrhea, constipation, blood in stool, abdominal distention and anal bleeding.  Genitourinary: Negative for dysuria and flank pain.  Musculoskeletal: Negative for myalgias, arthralgias and gait problem.  Skin: Negative for color change and rash.  Neurological: Negative for dizziness and headaches.  Hematological: Negative for adenopathy. Does not bruise/bleed easily.  Psychiatric/Behavioral: Negative for suicidal ideas, disturbed wake/sleep cycle and  dysphoric mood. The patient is not nervous/anxious.    BP 140/90  Pulse 73  Temp 98.3 F (36.8 C) (Oral)  Ht 5\' 7"  (1.702 m)  Wt 190 lb 12 oz (86.524 kg)  BMI 29.88 kg/m2  SpO2 98%     Objective:   Physical Exam  Constitutional: She is oriented to person, place, and time. She appears well-developed and well-nourished. No distress.  HENT:  Head: Normocephalic and atraumatic.  Right Ear: External ear normal.  Left Ear: External ear normal.  Nose: Nose normal.  Mouth/Throat: Oropharynx is clear and moist. No oropharyngeal exudate.  Eyes: Conjunctivae are normal. Pupils are equal, round, and reactive to light. Right eye exhibits no discharge. Left eye exhibits no discharge. No scleral icterus.  Neck: Normal range of motion. Neck supple. No tracheal deviation present. No thyromegaly present.  Cardiovascular: Normal rate, regular rhythm, normal heart sounds and intact distal pulses.  Exam reveals no gallop and no friction rub.   No murmur heard. Pulmonary/Chest: Effort normal and breath sounds normal. No respiratory distress. She has no wheezes. She has no rales. She exhibits no tenderness.  Musculoskeletal: Normal range of motion. She exhibits no edema and no tenderness.  Lymphadenopathy:    She has no cervical adenopathy.  Neurological: She is alert and oriented to person, place, and time. No cranial nerve deficit. She exhibits normal muscle tone. Coordination normal.  Skin: Skin is warm and dry. No rash noted. She is not diaphoretic. No erythema. No pallor.  Psychiatric: She has a normal mood and affect. Her behavior is normal. Judgment and thought content normal.  Assessment & Plan:

## 2011-11-11 NOTE — Assessment & Plan Note (Signed)
Patient reports recent imaging and blood work has shown no evidence of recurrence of cancer. Will obtain records from oncologist.

## 2011-11-11 NOTE — Assessment & Plan Note (Signed)
Blood pressure slightly elevated today but has been well controlled historically. Will plan to continue losartan. Patient recently had renal function checked by her oncologist. Will obtain records on this. Followup 6 months.

## 2011-11-24 ENCOUNTER — Ambulatory Visit: Payer: Self-pay | Admitting: Internal Medicine

## 2011-12-16 ENCOUNTER — Telehealth: Payer: Self-pay | Admitting: Internal Medicine

## 2011-12-16 NOTE — Telephone Encounter (Signed)
Patient called in asking [place] to fax last labs and office notes to Dr. [name] he is going to see patient on Monday about hernia. I have faxed the notes and lab to [phone].  Dr. Dan Humphreys she wanted you to know that she was going to see him on Monday.

## 2012-02-02 ENCOUNTER — Ambulatory Visit: Payer: Self-pay | Admitting: Internal Medicine

## 2012-02-11 LAB — HM MAMMOGRAPHY: HM Mammogram: NORMAL

## 2012-02-29 ENCOUNTER — Ambulatory Visit: Payer: Self-pay | Admitting: Internal Medicine

## 2012-02-29 ENCOUNTER — Ambulatory Visit: Payer: Self-pay | Admitting: Gynecologic Oncology

## 2012-03-01 LAB — CEA: CEA: 0.6 ng/mL (ref 0.0–4.7)

## 2012-03-07 LAB — CA 125: CA 125: 25.6 U/mL (ref 0.0–34.0)

## 2012-03-14 ENCOUNTER — Encounter: Payer: Medicare Other | Admitting: Internal Medicine

## 2012-03-25 ENCOUNTER — Ambulatory Visit: Payer: Self-pay | Admitting: Internal Medicine

## 2012-03-26 ENCOUNTER — Ambulatory Visit (INDEPENDENT_AMBULATORY_CARE_PROVIDER_SITE_OTHER): Payer: Medicare Other

## 2012-03-26 DIAGNOSIS — Z23 Encounter for immunization: Secondary | ICD-10-CM

## 2012-04-12 ENCOUNTER — Encounter: Payer: Self-pay | Admitting: Internal Medicine

## 2012-04-12 ENCOUNTER — Ambulatory Visit (INDEPENDENT_AMBULATORY_CARE_PROVIDER_SITE_OTHER): Payer: Medicare Other | Admitting: Internal Medicine

## 2012-04-12 VITALS — BP 130/70 | HR 70 | Temp 98.6°F | Resp 16 | Ht 67.0 in | Wt 190.5 lb

## 2012-04-12 DIAGNOSIS — Z1331 Encounter for screening for depression: Secondary | ICD-10-CM

## 2012-04-12 DIAGNOSIS — L02219 Cutaneous abscess of trunk, unspecified: Secondary | ICD-10-CM

## 2012-04-12 DIAGNOSIS — L03319 Cellulitis of trunk, unspecified: Secondary | ICD-10-CM

## 2012-04-12 DIAGNOSIS — I10 Essential (primary) hypertension: Secondary | ICD-10-CM

## 2012-04-12 DIAGNOSIS — Z0000 Encounter for general adult medical examination without abnormal findings: Secondary | ICD-10-CM | POA: Insufficient documentation

## 2012-04-12 LAB — CBC WITH DIFFERENTIAL/PLATELET
Basophils Absolute: 0.1 10*3/uL (ref 0.0–0.1)
Basophils Relative: 1 % (ref 0.0–3.0)
Eosinophils Absolute: 0.4 10*3/uL (ref 0.0–0.7)
HCT: 38.7 % (ref 36.0–46.0)
Hemoglobin: 12.5 g/dL (ref 12.0–15.0)
Lymphocytes Relative: 26.8 % (ref 12.0–46.0)
Lymphs Abs: 2 10*3/uL (ref 0.7–4.0)
MCHC: 32.4 g/dL (ref 30.0–36.0)
MCV: 96.4 fl (ref 78.0–100.0)
Neutro Abs: 4.3 10*3/uL (ref 1.4–7.7)
RBC: 4.01 Mil/uL (ref 3.87–5.11)
RDW: 14.1 % (ref 11.5–14.6)

## 2012-04-12 LAB — COMPREHENSIVE METABOLIC PANEL
AST: 20 U/L (ref 0–37)
BUN: 18 mg/dL (ref 6–23)
Calcium: 9.7 mg/dL (ref 8.4–10.5)
Chloride: 104 mEq/L (ref 96–112)
Creatinine, Ser: 0.9 mg/dL (ref 0.4–1.2)
GFR: 65.11 mL/min (ref >60.00)
Total Bilirubin: 0.7 mg/dL (ref 0.3–1.2)

## 2012-04-12 LAB — MICROALBUMIN / CREATININE URINE RATIO
Creatinine,U: 96.5 mg/dL
Microalb Creat Ratio: 0.5 mg/g (ref 0.0–30.0)

## 2012-04-12 LAB — LIPID PANEL
Cholesterol: 237 mg/dL — ABNORMAL HIGH (ref 0–200)
VLDL: 14.2 mg/dL (ref 0.0–40.0)

## 2012-04-12 MED ORDER — DOXYCYCLINE HYCLATE 100 MG PO TABS: 100.0000 mg | ORAL_TABLET | Freq: Two times a day (BID) | ORAL | Status: DC

## 2012-04-12 MED ORDER — LOSARTAN POTASSIUM 100 MG PO TABS: 100.0000 mg | ORAL_TABLET | Freq: Every day | ORAL | Status: DC

## 2012-04-12 NOTE — Assessment & Plan Note (Signed)
General medical exam including breast exam normal today. Pelvic and Pap deferred as this was recently completed by her surgical oncologist who plans to proceed with oophorectomy next month, given recent abnormal area in the right adnexa. Health maintenance is up to date. Encourage continued effort at healthy diet and regular physical activity. Appropriate screening performed. Labs today, CBC, CMP, lipids, TSH. Followup for wellness visit in one year.

## 2012-04-12 NOTE — Assessment & Plan Note (Signed)
Lesion is most consistent with underlying sebaceous cyst with overlying abscess and cellulitis. After obtaining informed consent from the patient, area was prepared using Betadine. Area was anesthetized with 1 ML of lidocaine without epi. A small incision was made over the pustule using a scalpel. Small amount of purulent fluid was drained.  Pt tolerated the procedure well with no immediate complications noted. No blood loss noted. We discussed that this area will likely require further excision of the underlying cystic area. Recommended that she discuss this with her surgeon. Will also start treatment for surrounding cellulitis with Doxycycline 100mg  po bid x 10 days. Follow up if any persistent or worsening pain, fever, chills.

## 2012-04-12 NOTE — Progress Notes (Signed)
Subjective:    Patient ID: Sabrina Wilson, female    DOB: [birth year], 71 y.o.   MRN: [id number]  HPI The patient is here for annual Medicare wellness examination and management of other chronic and acute problems.   The risk factors are reflected in the social history.  The roster of all physicians providing medical care to patient - is listed in the Snapshot section of the chart.  Activities of daily living:  The patient is 100% independent in all ADLs: dressing, toileting, feeding as well as independent mobility  Home safety : The patient has smoke detectors in the home. They wear seatbelts.  There are no firearms at home. There is no violence in the home.   There is no risks for hepatitis, STDs or HIV. There is a history of blood transfusion during cancer treatment. They have no travel history to infectious disease endemic areas of the world.  The patient has seen their dentist in the last six month. [facility]) They have seen their eye doctor in the last year. [facility]) No issues with hearing. They have deferred audiologic testing in the last year.   They do not  have excessive sun exposure. Discussed the need for sun protection: hats, long sleeves and use of sunscreen if there is significant sun exposure. Dermatologist - Dr. Gwen Pounds  Diet: the importance of a healthy diet is discussed. They do have a healthy diet.  The benefits of regular aerobic exercise were discussed. She walks occasionally.  Depression screen: there are no signs or vegative symptoms of depression- irritability, change in appetite, anhedonia, sadness/tearfullness.  Cognitive assessment: the patient manages all their financial and personal affairs and is actively engaged. They could relate day,date,year and events.  The following portions of the patient's history were reviewed and updated as appropriate: allergies, current medications, past family history, past medical history,  past surgical  history, past social history  and problem list.  Visual acuity was not assessed per patient preference since she has regular follow up with her ophthalmologist. Hearing and body mass index were assessed and reviewed.   During the course of the visit the patient was educated and counseled about appropriate screening and preventive services including : fall prevention , diabetes screening, nutrition counseling, colorectal cancer screening, and recommended immunizations.    Patient is also concerned today about a painful lesion on her left upper back. She reports that she showed this to her dermatologist several months ago and he was not concerned about it. He decided not to biopsy the lesion or start any medical treatment. Over the last couple of days, the lesion has gotten larger and more painful. It is irritated by her bra strap. She denies any drainage from the area. She denies any fever or chills.   Outpatient Encounter Prescriptions as of 04/12/2012  Medication Sig Dispense Refill  . fish oil-omega-3 fatty acids 1000 MG capsule Take 2 g by mouth daily.        Marland Kitchen losartan (COZAAR) 100 MG tablet Take 1 tablet (100 mg total) by mouth daily.  90 tablet  4  . VITAMIN D, ERGOCALCIFEROL, PO 2 tablets daily.        . [DISCONTINUED] losartan (COZAAR) 100 MG tablet TAKE 1 TABLET (100 MG TOTAL) BY MOUTH DAILY.  30 tablet  6  . doxycycline (VIBRA-TABS) 100 MG tablet Take 1 tablet (100 mg total) by mouth 2 (two) times daily.  20 tablet  0   BP 130/70  Pulse  70  Temp 98.6 F (37 C) (Oral)  Resp 16  Ht 5\' 7"  (1.702 m)  Wt 190 lb 8 oz (86.41 kg)  BMI 29.84 kg/m2  SpO2 97%  Review of Systems  Constitutional: Negative for fever, chills, appetite change, fatigue and unexpected weight change.  HENT: Negative for ear pain, congestion, sore throat, trouble swallowing, neck pain, voice change and sinus pressure.   Eyes: Negative for visual disturbance.  Respiratory: Negative for cough, shortness of breath,  wheezing and stridor.   Cardiovascular: Negative for chest pain, palpitations and leg swelling.  Gastrointestinal: Negative for nausea, vomiting, abdominal pain, diarrhea, constipation, blood in stool, abdominal distention and anal bleeding.  Genitourinary: Negative for dysuria and flank pain.  Musculoskeletal: Negative for myalgias, arthralgias and gait problem.  Skin: Positive for color change. Negative for rash.  Neurological: Negative for dizziness and headaches.  Hematological: Negative for adenopathy. Does not bruise/bleed easily.  Psychiatric/Behavioral: Negative for suicidal ideas, sleep disturbance and dysphoric mood. The patient is not nervous/anxious.        Objective:   Physical Exam  Constitutional: She is oriented to person, place, and time. She appears well-developed and well-nourished. No distress.  HENT:  Head: Normocephalic and atraumatic.  Right Ear: External ear normal.  Left Ear: External ear normal.  Nose: Nose normal.  Mouth/Throat: Oropharynx is clear and moist. No oropharyngeal exudate.  Eyes: Conjunctivae normal are normal. Pupils are equal, round, and reactive to light. Right eye exhibits no discharge. Left eye exhibits no discharge. No scleral icterus.  Neck: Normal range of motion. Neck supple. No tracheal deviation present. No thyromegaly present.  Cardiovascular: Normal rate, regular rhythm, normal heart sounds and intact distal pulses.  Exam reveals no gallop and no friction rub.   No murmur heard. Pulmonary/Chest: Effort normal and breath sounds normal. No accessory muscle usage. Not tachypneic. No respiratory distress. She has no wheezes. She has no rales. She exhibits no tenderness. Right breast exhibits no inverted nipple, no mass, no nipple discharge, no skin change and no tenderness. Left breast exhibits no inverted nipple, no mass, no nipple discharge, no skin change and no tenderness. Breasts are symmetrical.  Abdominal: Soft. Bowel sounds are normal.  She exhibits no distension and no mass. There is no tenderness. There is no rebound and no guarding.  Musculoskeletal: Normal range of motion. She exhibits no edema and no tenderness.  Lymphadenopathy:    She has no cervical adenopathy.  Neurological: She is alert and oriented to person, place, and time. No cranial nerve deficit. She exhibits normal muscle tone. Coordination normal.  Skin: Skin is warm and dry. Lesion noted. No rash noted. She is not diaphoretic. There is erythema. No pallor.     Psychiatric: She has a normal mood and affect. Her behavior is normal. Judgment and thought content normal.          Assessment & Plan:

## 2012-04-12 NOTE — Addendum Note (Signed)
Addended by: Ronna Polio A on: 04/12/2012 05:47 PM   Modules accepted: Orders

## 2012-04-25 HISTORY — PX: DILATION AND CURETTAGE OF UTERUS: SHX78

## 2012-05-03 ENCOUNTER — Ambulatory Visit: Payer: Self-pay | Admitting: Internal Medicine

## 2012-05-26 ENCOUNTER — Ambulatory Visit: Payer: Self-pay | Admitting: Internal Medicine

## 2012-06-12 ENCOUNTER — Ambulatory Visit: Payer: Self-pay | Admitting: General Surgery

## 2012-06-23 ENCOUNTER — Ambulatory Visit: Payer: Self-pay | Admitting: Gynecologic Oncology

## 2012-06-25 ENCOUNTER — Ambulatory Visit: Payer: Self-pay | Admitting: General Surgery

## 2012-06-25 LAB — CBC WITH DIFFERENTIAL/PLATELET
Basophil #: 0.1 10*3/uL (ref 0.0–0.1)
Basophil %: 1.2 %
Eosinophil #: 0.4 10*3/uL (ref 0.0–0.7)
Eosinophil %: 5.9 %
HCT: 37.6 % (ref 35.0–47.0)
Lymphocyte #: 1.5 10*3/uL (ref 1.0–3.6)
Lymphocyte %: 23.8 %
MCH: 32.5 pg (ref 26.0–34.0)
MCHC: 33.1 g/dL (ref 32.0–36.0)
Monocyte %: 9.1 %
Neutrophil %: 60 %
RDW: 13.6 % (ref 11.5–14.5)
WBC: 6.3 10*3/uL (ref 3.6–11.0)

## 2012-06-25 LAB — BASIC METABOLIC PANEL
Anion Gap: 2 — ABNORMAL LOW (ref 7–16)
Creatinine: 0.93 mg/dL (ref 0.60–1.30)
EGFR (Non-African Amer.): >60
Sodium: 139 mmol/L (ref 136–145)

## 2012-06-25 LAB — PATHOLOGY REPORT

## 2012-06-28 ENCOUNTER — Ambulatory Visit: Payer: Self-pay | Admitting: Internal Medicine

## 2012-07-03 ENCOUNTER — Ambulatory Visit: Payer: Self-pay | Admitting: Gynecologic Oncology

## 2012-07-05 LAB — PATHOLOGY REPORT

## 2012-07-13 ENCOUNTER — Inpatient Hospital Stay: Payer: Self-pay | Admitting: General Surgery

## 2012-07-13 DIAGNOSIS — K436 Other and unspecified ventral hernia with obstruction, without gangrene: Secondary | ICD-10-CM

## 2012-07-13 HISTORY — PX: HERNIA REPAIR: SHX51

## 2012-07-14 LAB — CREATININE, SERUM
EGFR (African American): >60
EGFR (Non-African Amer.): >60

## 2012-07-15 LAB — CBC WITH DIFFERENTIAL/PLATELET
Basophil #: 0 10*3/uL (ref 0.0–0.1)
Basophil %: 0.3 %
Eosinophil #: 0.2 10*3/uL (ref 0.0–0.7)
Eosinophil %: 1.6 %
Lymphocyte #: 0.7 10*3/uL — ABNORMAL LOW (ref 1.0–3.6)
Lymphocyte %: 5.7 %
MCH: 31.9 pg (ref 26.0–34.0)
MCV: 97 fL (ref 80–100)
Monocyte #: 1.1 x10 3/mm — ABNORMAL HIGH (ref 0.2–0.9)
Monocyte %: 8.7 %
Neutrophil #: 11 10*3/uL — ABNORMAL HIGH (ref 1.4–6.5)
Neutrophil %: 83.7 %
Platelet: 199 10*3/uL (ref 150–440)
RDW: 13 % (ref 11.5–14.5)
WBC: 13.1 10*3/uL — ABNORMAL HIGH (ref 3.6–11.0)

## 2012-07-15 LAB — BASIC METABOLIC PANEL
BUN: 10 mg/dL (ref 7–18)
Chloride: 100 mmol/L (ref 98–107)
Creatinine: 0.68 mg/dL (ref 0.60–1.30)
EGFR (African American): >60
EGFR (Non-African Amer.): >60
Osmolality: 268 (ref 275–301)
Sodium: 133 mmol/L — ABNORMAL LOW (ref 136–145)

## 2012-07-16 DIAGNOSIS — I359 Nonrheumatic aortic valve disorder, unspecified: Secondary | ICD-10-CM

## 2012-07-18 LAB — CBC WITH DIFFERENTIAL/PLATELET
Basophil %: 1.2 %
HCT: 30.1 % — ABNORMAL LOW (ref 35.0–47.0)
Lymphocyte #: 1.1 10*3/uL (ref 1.0–3.6)
Lymphocyte %: 18.3 %
MCH: 31.4 pg (ref 26.0–34.0)
Monocyte #: 0.7 x10 3/mm (ref 0.2–0.9)
Monocyte %: 10.9 %
Neutrophil %: 61.8 %
Platelet: 280 10*3/uL (ref 150–440)
RBC: 3.11 10*6/uL — ABNORMAL LOW (ref 3.80–5.20)
RDW: 12.9 % (ref 11.5–14.5)

## 2012-07-18 LAB — POTASSIUM: Potassium: 3.8 mmol/L (ref 3.5–5.1)

## 2012-07-20 ENCOUNTER — Telehealth: Payer: Self-pay | Admitting: Internal Medicine

## 2012-07-20 ENCOUNTER — Ambulatory Visit (INDEPENDENT_AMBULATORY_CARE_PROVIDER_SITE_OTHER): Payer: Medicare Other | Admitting: *Deleted

## 2012-07-20 ENCOUNTER — Ambulatory Visit: Payer: Self-pay

## 2012-07-20 DIAGNOSIS — K436 Other and unspecified ventral hernia with obstruction, without gangrene: Secondary | ICD-10-CM

## 2012-07-20 NOTE — Patient Instructions (Signed)
Pt to return as scheduled. 

## 2012-07-20 NOTE — Telephone Encounter (Addendum)
Been checking her email and it said something about an appointment. She has been in the hospital, she had surgery last Friday. Patient stated she received an email from this office but there is nothing in the system about an email. But if she needs an appointment then please send it through mychart.

## 2012-07-20 NOTE — Progress Notes (Signed)
This pt is seen today for staple removal from abdominal hernia repair done 07/13/2012. Pt is tolerating her diet, voiding and moving bowels. She states that she is doing well. Midline staple removed with minimal discomfort to pt. Incision with skin edges intact. Tinc of benzoin and 1/2 inch steri strips applied. She is to return to office on 07/26/12 @ 1330 with Dr. [name]. L.Feeney RN

## 2012-07-20 NOTE — Telephone Encounter (Signed)
Left message on voice mail wanting someone to call her back

## 2012-07-24 ENCOUNTER — Ambulatory Visit: Payer: Self-pay | Admitting: Internal Medicine

## 2012-07-24 ENCOUNTER — Ambulatory Visit: Payer: Self-pay | Admitting: Gynecologic Oncology

## 2012-07-24 NOTE — Telephone Encounter (Signed)
Spoke with pt.  Pt stated it was from byrnetts office.    Pt stated she would call back to schedule her appointment in june

## 2012-07-24 NOTE — Telephone Encounter (Signed)
She just called and stated there was an email from this office in reference to an appointment however I did not see anything about that. She does suppose to have a 6 month follow up appointment scheduled for June this year, that is the only thing I see.

## 2012-07-24 NOTE — Telephone Encounter (Signed)
Does pt need follow??

## 2012-07-26 ENCOUNTER — Ambulatory Visit (INDEPENDENT_AMBULATORY_CARE_PROVIDER_SITE_OTHER): Payer: Medicare Other | Admitting: General Surgery

## 2012-07-26 ENCOUNTER — Encounter: Payer: Self-pay | Admitting: General Surgery

## 2012-07-26 VITALS — BP 148/78 | HR 58 | Resp 12 | Ht 67.0 in | Wt 185.0 lb

## 2012-07-26 DIAGNOSIS — K439 Ventral hernia without obstruction or gangrene: Secondary | ICD-10-CM | POA: Insufficient documentation

## 2012-07-26 DIAGNOSIS — K436 Other and unspecified ventral hernia with obstruction, without gangrene: Secondary | ICD-10-CM

## 2012-07-26 DIAGNOSIS — D649 Anemia, unspecified: Secondary | ICD-10-CM

## 2012-07-26 NOTE — Progress Notes (Signed)
**Note Sabrina-Identified via Obfuscation** Patient ID: Sabrina Wilson, female   DOB: May 01, 1940, 72 y.o.   MRN: [id number]  Chief Complaint  Patient presents with  . Incisional Hernia    postop evaluation    HPI Sabrina Wilson is a 72 y.o. female here today for her post op ventral hernia repair done 07/13/12.Patient use heat off and on as needed to help with any pain. She has not required any narcotics in the past 4 days. The patient underwent component separation of the left abdominal wall with placement of a 25 x 30 cm soft Prolene mesh for reinforcement of her 4 abdominal wall hernias.  On postoperative day 2 she developed shortness of breath. CT of the chest was negative for PE. Atelectasis was identified. Moderate anemia with a hemoglobin of 10 was appreciated thought secondary to dilution.  The patient reports that she's had a good appetite, normal bowel movements and no difficulty with urination.  Original plans for ovary removal at the time of her hernia repair was not completed because of the extensive pelvic adhesions. HPI  Past Medical History  Diagnosis Date  . Colon cancer 2009    s/p resection and chemotherapy by Dr. Neale Burly  . Polyp at cervical os     s/p resection Dr. Hyacinth Meeker  . Hernia of flank     Past Surgical History  Procedure Laterality Date  . Colectomy    . Hernia repair  07/13/12    ventral     Family History  Problem Relation Age of Onset  . Stroke Mother   . Diabetes Mother   . Cancer Father     colon  . Cancer Brother     colon  . Cancer Brother     brain    Social History History  Substance Use Topics  . Smoking status: Former Smoker    Quit date: 10/12/2007  . Smokeless tobacco: Never Used  . Alcohol Use: 0.5 oz/week    1 drink(s) per week     Comment: with dinner    Allergies  Allergen Reactions  . Sulfa Antibiotics Hives    Current Outpatient Prescriptions  Medication Sig Dispense Refill  . doxycycline (VIBRA-TABS) 100 MG tablet Take 1 tablet (100 mg total) by mouth  2 (two) times daily.  20 tablet  0  . fish oil-omega-3 fatty acids 1000 MG capsule Take 2 g by mouth daily.        Marland Kitchen losartan (COZAAR) 100 MG tablet Take 1 tablet (100 mg total) by mouth daily.  90 tablet  4  . VITAMIN D, ERGOCALCIFEROL, PO 2 tablets daily.         No current facility-administered medications for this visit.    Review of Systems Review of Systems  Constitutional: Negative.   Respiratory: Negative.   Cardiovascular: Negative.     Blood pressure 148/78, pulse 58, resp. rate 12, height 5\' 7"  (1.702 m), weight 185 lb (83.915 kg).  Physical Exam Physical Exam  Constitutional: She appears well-developed.  Neck: Normal range of motion. Neck supple.  Cardiovascular: Normal rate, regular rhythm and normal heart sounds.   Occasional premature beats.  Pulmonary/Chest: Effort normal and breath sounds normal.  Abdominal: Soft. Normal appearance and bowel sounds are normal. There is no guarding.  Ventral hernia repairs healing well. When she elevates her legs off the examining table and when examined in the standing position no abdominal wall weakness was appreciated. No evidence of loculated fluid. Minimal skin indentation in the left lower quadrant from the  transfacial suture.    Data Reviewed None.  Assessment    Doing well status post abdominal wall reconstruction with component separation technique.  Postoperative anemia, likely secondary to dilution. A repeat CBC will be obtained today.     Plan    The patient will increase her activities as tolerated but refrain from strenuous lifting.        [name] 07/26/2012, 10:07 PM

## 2012-07-26 NOTE — Patient Instructions (Addendum)
The patient is aware to use a heating pad as needed for comfort.  

## 2012-07-27 LAB — CBC WITH DIFFERENTIAL/PLATELET
Basophils Absolute: 0.1 10*3/uL (ref 0.0–0.2)
Basos: 1 % (ref 0–3)
Eos: 4 % (ref 0–5)
Eosinophils Absolute: 0.4 10*3/uL (ref 0.0–0.4)
HCT: 37.2 % (ref 34.0–46.6)
Hemoglobin: 12.4 g/dL (ref 11.1–15.9)
Immature Grans (Abs): 0 10*3/uL (ref 0.0–0.1)
Immature Granulocytes: 0 % (ref 0–2)
Lymphocytes Absolute: 2.1 10*3/uL (ref 0.7–3.1)
Lymphs: 20 % (ref 14–46)
MCH: 31.9 pg (ref 26.6–33.0)
MCHC: 33.3 g/dL (ref 31.5–35.7)
MCV: 96 fL (ref 79–97)
Monocytes Absolute: 0.8 10*3/uL (ref 0.1–0.9)
Monocytes: 8 % (ref 4–12)
Neutrophils Absolute: 6.9 10*3/uL (ref 1.4–7.0)
Neutrophils Relative %: 67 % (ref 40–74)
RBC: 3.89 x10E6/uL (ref 3.77–5.28)
RDW: 13.5 % (ref 12.3–15.4)
WBC: 10.3 10*3/uL (ref 3.4–10.8)

## 2012-07-27 NOTE — Progress Notes (Signed)
Quick Note:  Hemoglobin has returned to baseline. Normal WBC and differential. ______

## 2012-08-15 ENCOUNTER — Encounter: Payer: Self-pay | Admitting: General Surgery

## 2012-08-15 ENCOUNTER — Ambulatory Visit (INDEPENDENT_AMBULATORY_CARE_PROVIDER_SITE_OTHER): Payer: Medicare Other | Admitting: General Surgery

## 2012-08-15 VITALS — BP 144/80 | HR 76 | Resp 14 | Ht 67.0 in | Wt 178.0 lb

## 2012-08-15 DIAGNOSIS — K439 Ventral hernia without obstruction or gangrene: Secondary | ICD-10-CM

## 2012-08-15 NOTE — Progress Notes (Signed)
Patient ID: Sabrina Wilson, female   DOB: May 10, 1940, 72 y.o.   MRN: [id number]  Chief Complaint  Patient presents with  . Routine Post Op    ventral hernia repair    HPI Sabrina Wilson is a 72 y.o. female who presents for post op ventral hernia repair. The procedure was performed on 07/13/12. The patient is recovering well. The only complaint is a little bit of upper abdomen soreness.  HPI  Past Medical History  Diagnosis Date  . Colon cancer 2009    s/p resection and chemotherapy by Dr. Neale Burly  . Polyp at cervical os     s/p resection Dr. Hyacinth Meeker  . Hernia of flank     Past Surgical History  Procedure Laterality Date  . Colectomy    . Hernia repair  07/13/12    ventral     Family History  Problem Relation Age of Onset  . Stroke Mother   . Diabetes Mother   . Cancer Father     colon  . Cancer Brother     colon  . Cancer Brother     brain    Social History History  Substance Use Topics  . Smoking status: Former Smoker    Quit date: 10/12/2007  . Smokeless tobacco: Never Used  . Alcohol Use: 0.5 oz/week    1 drink(s) per week     Comment: with dinner    Allergies  Allergen Reactions  . Sulfa Antibiotics Hives    Current Outpatient Prescriptions  Medication Sig Dispense Refill  . doxycycline (VIBRA-TABS) 100 MG tablet Take 1 tablet (100 mg total) by mouth 2 (two) times daily.  20 tablet  0  . fish oil-omega-3 fatty acids 1000 MG capsule Take 2 g by mouth daily.        Marland Kitchen losartan (COZAAR) 100 MG tablet Take 1 tablet (100 mg total) by mouth daily.  90 tablet  4  . VITAMIN D, ERGOCALCIFEROL, PO 2 tablets daily.         No current facility-administered medications for this visit.    Review of Systems Review of Systems  Constitutional: Negative.   Respiratory: Negative.   Cardiovascular: Negative.   Gastrointestinal: Negative.     Blood pressure 144/80, pulse 76, resp. rate 14, height 5\' 7"  (1.702 m), weight 178 lb (80.74 kg).  Physical  Exam Physical Exam  Constitutional: She appears well-developed and well-nourished.  Abdominal: Soft. Normal appearance and bowel sounds are normal. There is no hepatosplenomegaly. No hernia.  Excellent healing of the incision without evidence of loculated fluid is noted.  Data Reviewed None.  Assessment    Doing well status post ventral hernia repair.     Plan    Judicious strenuous physical activity as been recommended. We'll arrange for a followup exam in 3 months.        Sabrina Wilson 08/15/2012, 10:12 PM

## 2012-08-15 NOTE — Patient Instructions (Addendum)
Patient to return in 3 months. Resume daily activities with caution when lifting, pushing, or pulling.

## 2012-09-04 ENCOUNTER — Ambulatory Visit: Payer: Medicare Other | Admitting: General Surgery

## 2012-10-24 ENCOUNTER — Ambulatory Visit: Payer: Self-pay | Admitting: Internal Medicine

## 2012-11-10 LAB — CEA: CEA: 0.9 ng/mL (ref 0.0–4.7)

## 2012-11-15 ENCOUNTER — Encounter: Payer: Self-pay | Admitting: General Surgery

## 2012-11-15 ENCOUNTER — Ambulatory Visit (INDEPENDENT_AMBULATORY_CARE_PROVIDER_SITE_OTHER): Payer: Medicare Other | Admitting: General Surgery

## 2012-11-15 VITALS — BP 134/70 | HR 84 | Resp 14 | Ht 67.0 in | Wt 189.0 lb

## 2012-11-15 DIAGNOSIS — K439 Ventral hernia without obstruction or gangrene: Secondary | ICD-10-CM

## 2012-11-15 NOTE — Progress Notes (Signed)
Patient ID: Sabrina Wilson, female   DOB: 04/04/1941, 72 y.o.   MRN: [id number]  Chief Complaint  Patient presents with  . Follow-up    3 month follow up hernia repair    HPI Sabrina Wilson is a 73 y.o. female who presents for a 3 month follow up hernia repair. The procedure was performed on 07/13/12. The patient is doing well overall. She states she still has some tenderness with movement.   HPI  Past Medical History  Diagnosis Date  . Colon cancer 2009    s/p resection and chemotherapy by Dr. Neale Burly  . Polyp at cervical os     s/p resection Dr. Hyacinth Meeker  . Hernia of flank     Past Surgical History  Procedure Laterality Date  . Colectomy    . Hernia repair  07/13/12    ventral     Family History  Problem Relation Age of Onset  . Stroke Mother   . Diabetes Mother   . Cancer Father     colon  . Cancer Brother     colon  . Cancer Brother     brain    Social History History  Substance Use Topics  . Smoking status: Former Smoker    Quit date: 10/12/2007  . Smokeless tobacco: Never Used  . Alcohol Use: 0.5 oz/week    1 drink(s) per week     Comment: with dinner    Allergies  Allergen Reactions  . Sulfa Antibiotics Hives    Current Outpatient Prescriptions  Medication Sig Dispense Refill  . fish oil-omega-3 fatty acids 1000 MG capsule Take 2 g by mouth daily.        Marland Kitchen losartan (COZAAR) 100 MG tablet Take 1 tablet (100 mg total) by mouth daily.  90 tablet  4  . VITAMIN D, ERGOCALCIFEROL, PO 2 tablets daily.         No current facility-administered medications for this visit.    Review of Systems Review of Systems  Constitutional: Negative.   Respiratory: Negative.   Cardiovascular: Negative.   Gastrointestinal: Negative.     Blood pressure 134/70, pulse 84, resp. rate 14, height 5\' 7"  (1.702 m), weight 189 lb (85.73 kg).  Physical Exam Physical Exam  Constitutional: She is oriented to person, place, and time. She appears well-developed and  well-nourished.  Abdominal: Soft. Normal appearance.  Neurological: She is alert and oriented to person, place, and time.  Skin: Skin is warm and dry.  Examination in the standing position showed no weakness or asymmetry. Status post left component separation for closure of multiple fascial defects as well as a stomal defect at the site of the previous colostomy.  Data Reviewed No new data.  Assessment    Doing well status post multiple ventral hernia repair with component separation technique and mesh .     Plan    We'll arrange for a followup examination in 9 months.        [name] 11/16/2012, 2:32 PM

## 2012-11-15 NOTE — Patient Instructions (Addendum)
Patient to return in March 2015 for follow up.

## 2012-11-16 ENCOUNTER — Encounter: Payer: Self-pay | Admitting: General Surgery

## 2012-11-23 ENCOUNTER — Ambulatory Visit: Payer: Self-pay | Admitting: Internal Medicine

## 2013-02-04 ENCOUNTER — Ambulatory Visit: Payer: Self-pay | Admitting: Internal Medicine

## 2013-02-07 ENCOUNTER — Ambulatory Visit (INDEPENDENT_AMBULATORY_CARE_PROVIDER_SITE_OTHER): Payer: Medicare Other

## 2013-02-07 DIAGNOSIS — Z23 Encounter for immunization: Secondary | ICD-10-CM

## 2013-03-25 ENCOUNTER — Encounter: Payer: Self-pay | Admitting: Internal Medicine

## 2013-03-25 ENCOUNTER — Ambulatory Visit (INDEPENDENT_AMBULATORY_CARE_PROVIDER_SITE_OTHER): Payer: Medicare Other | Admitting: Internal Medicine

## 2013-03-25 VITALS — BP 128/78 | HR 66 | Temp 98.3°F | Ht 67.0 in | Wt 192.0 lb

## 2013-03-25 DIAGNOSIS — Z0000 Encounter for general adult medical examination without abnormal findings: Secondary | ICD-10-CM

## 2013-03-25 DIAGNOSIS — Z780 Asymptomatic menopausal state: Secondary | ICD-10-CM | POA: Insufficient documentation

## 2013-03-25 DIAGNOSIS — I10 Essential (primary) hypertension: Secondary | ICD-10-CM

## 2013-03-25 LAB — CBC WITH DIFFERENTIAL/PLATELET
Basophils Absolute: 0.1 10*3/uL (ref 0.0–0.1)
Basophils Relative: 0.9 % (ref 0.0–3.0)
Lymphocytes Relative: 24.5 % (ref 12.0–46.0)
Lymphs Abs: 1.7 10*3/uL (ref 0.7–4.0)
Monocytes Relative: 7.1 % (ref 3.0–12.0)
Platelets: 259 10*3/uL (ref 150.0–400.0)
RBC: 3.91 Mil/uL (ref 3.87–5.11)
RDW: 14 % (ref 11.5–14.6)

## 2013-03-25 LAB — LIPID PANEL
Total CHOL/HDL Ratio: 2
Triglycerides: 79 mg/dL (ref 0.0–149.0)
VLDL: 15.8 mg/dL (ref 0.0–40.0)

## 2013-03-25 LAB — MICROALBUMIN / CREATININE URINE RATIO
Creatinine,U: 106.5 mg/dL
Microalb Creat Ratio: 0.2 mg/g (ref 0.0–30.0)
Microalb, Ur: 0.2 mg/dL (ref 0.0–1.9)

## 2013-03-25 LAB — COMPREHENSIVE METABOLIC PANEL
BUN: 22 mg/dL (ref 6–23)
CO2: 29 mEq/L (ref 19–32)
GFR: 67.49 mL/min (ref >60.00)
Glucose, Bld: 95 mg/dL (ref 70–99)
Sodium: 139 mEq/L (ref 135–145)
Total Bilirubin: 0.6 mg/dL (ref 0.3–1.2)
Total Protein: 6.9 g/dL (ref 6.0–8.3)

## 2013-03-25 LAB — LDL CHOLESTEROL, DIRECT: Direct LDL: 128.7 mg/dL

## 2013-03-25 MED ORDER — LOSARTAN POTASSIUM 100 MG PO TABS: 100.0000 mg | ORAL_TABLET | Freq: Every day | ORAL | Status: DC

## 2013-03-25 NOTE — Assessment & Plan Note (Signed)
Bone density testing ordered. 

## 2013-03-25 NOTE — Assessment & Plan Note (Signed)
BP Readings from Last 3 Encounters:  03/25/13 128/78  11/15/12 134/70  08/15/12 144/80   BP well controlled on Losartan. Will continue. Follow up 6 months and prn.

## 2013-03-25 NOTE — Progress Notes (Signed)
Pre-visit discussion using our clinic review tool. No additional management support is needed unless otherwise documented below in the visit note.  

## 2013-03-25 NOTE — Assessment & Plan Note (Signed)
General medical exam including breast exam normal today. Pap and pelvic deferred as these were recently completed by her gynecologist oncologist and were reportedly normal. Mammogram is up-to-date. Bone density testing ordered. Colonoscopy is up-to-date. Will check labs including CBC, CMP, lipid profile. Encouraged continued healthy diet and regular exercise. Appropriate screening performed.

## 2013-03-25 NOTE — Progress Notes (Signed)
Subjective:    Patient ID: Sabrina Wilson, female    DOB: 1941-03-04, 72 y.o.   MRN: [id number]  HPI The patient is here for annual Medicare wellness examination and management of other chronic and acute problems.   The risk factors are reflected in the social history.  The roster of all physicians providing medical care to patient - is listed in the Snapshot section of the chart.  Activities of daily living:  The patient is 100% independent in all ADLs: dressing, toileting, feeding as well as independent mobility  Home safety : The patient has smoke detectors in the home. They wear seatbelts.  There are no firearms at home. There is no violence in the home.   There is no risks for hepatitis, STDs or HIV. There is a history of blood transfusion during cancer treatment. They have no travel history to infectious disease endemic areas of the world.  The patient has seen their dentist in the last six month. [facility] [place]) They have seen their eye doctor in the last year. Pt notes cataract in right eye. [facility]) No issues with hearing. They have deferred audiologic testing in the last year.   They do not  have excessive sun exposure. Discussed the need for sun protection: hats, long sleeves and use of sunscreen if there is significant sun exposure. Dermatologist - Dr. Gwen Pounds General Surgery - Dr. [name]  Diet: the importance of a healthy diet is discussed. They do have a healthy diet.  The benefits of regular aerobic exercise were discussed. She walks occasionally.  Depression screen: there are no signs or vegative symptoms of depression- irritability, change in appetite, anhedonia, sadness/tearfullness.  Cognitive assessment: the patient manages all their financial and personal affairs and is actively engaged. They could relate day,date,year and events.  The following portions of the patient's history were reviewed and updated as appropriate: allergies, current  medications, past family history, past medical history,  past surgical history, past social history  and problem list.  Visual acuity was not assessed per patient preference since she has regular follow up with her ophthalmologist. Hearing and body mass index were assessed and reviewed.   During the course of the visit the patient was educated and counseled about appropriate screening and preventive services including : fall prevention , diabetes screening, nutrition counseling, colorectal cancer screening, and recommended immunizations.     Outpatient Encounter Prescriptions as of 03/25/2013  Medication Sig  . fish oil-omega-3 fatty acids 1000 MG capsule Take 2 g by mouth daily.    Marland Kitchen losartan (COZAAR) 100 MG tablet Take 1 tablet (100 mg total) by mouth daily.  Marland Kitchen VITAMIN D, ERGOCALCIFEROL, PO 2 tablets daily.     BP 128/78  Pulse 66  Temp(Src) 98.3 F (36.8 C) (Oral)  Ht 5\' 7"  (1.702 m)  Wt 192 lb (87.091 kg)  BMI 30.06 kg/m2  SpO2 98%   Review of Systems  Constitutional: Negative for fever, chills, appetite change, fatigue and unexpected weight change.  HENT: Negative for congestion, ear pain, sinus pressure, sore throat, trouble swallowing and voice change.   Eyes: Negative for visual disturbance.  Respiratory: Negative for cough, shortness of breath, wheezing and stridor.   Cardiovascular: Negative for chest pain, palpitations and leg swelling.  Gastrointestinal: Negative for nausea, vomiting, abdominal pain, diarrhea, constipation, blood in stool, abdominal distention and anal bleeding.  Genitourinary: Negative for dysuria and flank pain.  Musculoskeletal: Negative for arthralgias, gait problem, myalgias and neck pain.  Skin:  Negative for color change and rash.  Neurological: Negative for dizziness and headaches.  Hematological: Negative for adenopathy. Does not bruise/bleed easily.  Psychiatric/Behavioral: Negative for suicidal ideas, sleep disturbance and dysphoric mood. The  patient is not nervous/anxious.        Objective:   Physical Exam  Constitutional: She is oriented to person, place, and time. She appears well-developed and well-nourished. No distress.  HENT:  Head: Normocephalic and atraumatic.  Right Ear: External ear normal.  Left Ear: External ear normal.  Nose: Nose normal.  Mouth/Throat: Oropharynx is clear and moist. No oropharyngeal exudate.  Eyes: Conjunctivae are normal. Pupils are equal, round, and reactive to light. Right eye exhibits no discharge. Left eye exhibits no discharge. No scleral icterus.  Neck: Normal range of motion. Neck supple. No tracheal deviation present. No thyromegaly present.  Cardiovascular: Normal rate, regular rhythm, normal heart sounds and intact distal pulses.  Exam reveals no gallop and no friction rub.   No murmur heard. Pulmonary/Chest: Effort normal and breath sounds normal. No accessory muscle usage. Not tachypneic. No respiratory distress. She has no decreased breath sounds. She has no wheezes. She has no rales. She exhibits no tenderness. Right breast exhibits no inverted nipple, no mass, no nipple discharge, no skin change and no tenderness. Left breast exhibits no inverted nipple, no mass, no nipple discharge, no skin change and no tenderness. Breasts are symmetrical.  Abdominal: Soft. Bowel sounds are normal. She exhibits no distension and no mass. There is no tenderness. There is no rebound and no guarding.  Musculoskeletal: Normal range of motion. She exhibits no edema and no tenderness.  Lymphadenopathy:    She has no cervical adenopathy.  Neurological: She is alert and oriented to person, place, and time. No cranial nerve deficit. She exhibits normal muscle tone. Coordination normal.  Skin: Skin is warm and dry. No rash noted. She is not diaphoretic. No erythema. No pallor.  Psychiatric: She has a normal mood and affect. Her behavior is normal. Judgment and thought content normal.            Assessment & Plan:

## 2013-04-23 ENCOUNTER — Encounter: Payer: Medicare Other | Admitting: Internal Medicine

## 2013-04-25 HISTORY — PX: COLONOSCOPY: SHX174

## 2013-05-15 ENCOUNTER — Ambulatory Visit: Payer: Self-pay | Admitting: Internal Medicine

## 2013-05-15 LAB — CBC CANCER CENTER
Basophil #: 0.1 x10 3/mm (ref 0.0–0.1)
Basophil %: 1.2 %
EOS ABS: 0.4 x10 3/mm (ref 0.0–0.7)
Eosinophil %: 5.2 %
HCT: 38.2 % (ref 35.0–47.0)
HGB: 12.4 g/dL (ref 12.0–16.0)
LYMPHS ABS: 2 x10 3/mm (ref 1.0–3.6)
Lymphocyte %: 28.6 %
MCH: 31.2 pg (ref 26.0–34.0)
MCHC: 32.6 g/dL (ref 32.0–36.0)
MCV: 96 fL (ref 80–100)
MONOS PCT: 9 %
Monocyte #: 0.6 x10 3/mm (ref 0.2–0.9)
NEUTROS ABS: 3.9 x10 3/mm (ref 1.4–6.5)
Neutrophil %: 56 %
PLATELETS: 255 x10 3/mm (ref 150–440)
RBC: 3.99 10*6/uL (ref 3.80–5.20)
RDW: 13.3 % (ref 11.5–14.5)
WBC: 7 x10 3/mm (ref 3.6–11.0)

## 2013-05-24 LAB — CA 125: CA 125: 22.3 U/mL (ref 0.0–34.0)

## 2013-05-24 LAB — CEA: CEA: 1 ng/mL (ref 0.0–4.7)

## 2013-05-26 ENCOUNTER — Ambulatory Visit: Payer: Self-pay | Admitting: Internal Medicine

## 2013-07-09 ENCOUNTER — Ambulatory Visit: Payer: Medicare Other | Admitting: General Surgery

## 2013-07-16 ENCOUNTER — Ambulatory Visit: Payer: Self-pay | Admitting: Internal Medicine

## 2013-07-17 ENCOUNTER — Ambulatory Visit (INDEPENDENT_AMBULATORY_CARE_PROVIDER_SITE_OTHER): Payer: Medicare Other | Admitting: General Surgery

## 2013-07-17 ENCOUNTER — Encounter: Payer: Self-pay | Admitting: General Surgery

## 2013-07-17 VITALS — BP 138/84 | HR 76 | Resp 16 | Ht 67.0 in | Wt 196.0 lb

## 2013-07-17 DIAGNOSIS — K439 Ventral hernia without obstruction or gangrene: Secondary | ICD-10-CM

## 2013-07-17 NOTE — Patient Instructions (Signed)
Patient to return as needed. The patient is aware to call back for any questions or concerns. 

## 2013-07-17 NOTE — Progress Notes (Signed)
Patient ID: Sabrina Wilson, female   DOB: July 29, 1940, 73 y.o.   MRN: [id number]  Chief Complaint  Patient presents with  . Follow-up    8 month follow up hernia repair    HPI Sabrina Wilson is a 73 y.o. female who presents for a 8 month follow up of a ventral hernia repair. The repair was performed on 07/13/12. At the time of surgery 8 20 x 30 cm soft Prolene mesh was placed with a left component separation completed to repair for fascial defects. A previous identified right ovary and cyst was left undisturbed due to the extensive adhesions in the pelvis. The patient denies any problems at this time. The patient reports that she had a GYN exam earlier this month with no abnormalities noted.  HPI  Past Medical History  Diagnosis Date  . Colon cancer 2009    s/p resection and chemotherapy by Dr. Cynda Acres  . Polyp at cervical os     s/p resection Dr. Sabra Heck  . Hernia of flank     Past Surgical History  Procedure Laterality Date  . Colectomy    . Hernia repair  07/13/12    ventral   . Dilation and curettage of uterus  2014    Dr. Sabra Heck, benign per pt    Family History  Problem Relation Age of Onset  . Stroke Mother   . Diabetes Mother   . Cancer Father     colon  . Cancer Brother     colon  . Cancer Brother     brain    Social History History  Substance Use Topics  . Smoking status: Former Smoker    Quit date: 10/12/2007  . Smokeless tobacco: Never Used  . Alcohol Use: 0.5 oz/week    1 drink(s) per week     Comment: with dinner    Allergies  Allergen Reactions  . Sulfa Antibiotics Hives    Current Outpatient Prescriptions  Medication Sig Dispense Refill  . fish oil-omega-3 fatty acids 1000 MG capsule Take 2 g by mouth daily.        Marland Kitchen losartan (COZAAR) 100 MG tablet Take 1 tablet (100 mg total) by mouth daily.  90 tablet  4  . VITAMIN D, ERGOCALCIFEROL, PO 2 tablets daily.         No current facility-administered medications for this visit.    Review of  Systems Review of Systems  Constitutional: Negative.   Respiratory: Negative.   Cardiovascular: Negative.   Gastrointestinal: Negative.     Blood pressure 138/84, pulse 76, resp. rate 16, height 5\' 7"  (1.702 m), weight 196 lb (88.905 kg).  Physical Exam Physical Exam  Constitutional: She is oriented to person, place, and time. She appears well-developed and well-nourished.  Cardiovascular: Normal rate, regular rhythm and normal heart sounds.   No murmur heard. Pulmonary/Chest: Effort normal and breath sounds normal.  Abdominal: Soft. Normal appearance and bowel sounds are normal. There is no hepatosplenomegaly. There is no tenderness. No hernia.  Hernia repair intact.   Neurological: She is alert and oriented to person, place, and time.  Skin: Skin is warm and dry.       Assessment    Doing well now one year status post ventral hernia repair with component separation.     Plan    She was encouraged to call should she have any difficulties, follow up otherwise will be on an as needed basis.  A prescription for an Epipen for the patient's husband  was provided to the patient.        Robert Bellow 07/17/2013, 8:28 PM

## 2013-07-24 ENCOUNTER — Ambulatory Visit: Payer: Self-pay | Admitting: Internal Medicine

## 2013-09-27 ENCOUNTER — Encounter: Payer: Self-pay | Admitting: Internal Medicine

## 2013-09-27 ENCOUNTER — Ambulatory Visit (INDEPENDENT_AMBULATORY_CARE_PROVIDER_SITE_OTHER): Payer: Medicare Other | Admitting: Internal Medicine

## 2013-09-27 VITALS — BP 138/72 | HR 65 | Temp 98.7°F | Ht 67.0 in | Wt 196.5 lb

## 2013-09-27 DIAGNOSIS — D170 Benign lipomatous neoplasm of skin and subcutaneous tissue of head, face and neck: Secondary | ICD-10-CM

## 2013-09-27 DIAGNOSIS — D1739 Benign lipomatous neoplasm of skin and subcutaneous tissue of other sites: Secondary | ICD-10-CM

## 2013-09-27 DIAGNOSIS — Z23 Encounter for immunization: Secondary | ICD-10-CM

## 2013-09-27 DIAGNOSIS — I10 Essential (primary) hypertension: Secondary | ICD-10-CM

## 2013-09-27 LAB — COMPREHENSIVE METABOLIC PANEL
ALBUMIN: 3.9 g/dL (ref 3.5–5.2)
ALT: 12 U/L (ref 0–35)
AST: 19 U/L (ref 0–37)
Alkaline Phosphatase: 54 U/L (ref 39–117)
BUN: 19 mg/dL (ref 6–23)
CALCIUM: 10 mg/dL (ref 8.4–10.5)
CHLORIDE: 105 meq/L (ref 96–112)
CO2: 28 mEq/L (ref 19–32)
Creatinine, Ser: 0.9 mg/dL (ref 0.4–1.2)
GFR: 63.23 mL/min (ref >60.00)
GLUCOSE: 95 mg/dL (ref 70–99)
POTASSIUM: 5.2 meq/L — AB (ref 3.5–5.1)
SODIUM: 141 meq/L (ref 135–145)
Total Bilirubin: 0.7 mg/dL (ref 0.2–1.2)
Total Protein: 7 g/dL (ref 6.0–8.3)

## 2013-09-27 LAB — MICROALBUMIN / CREATININE URINE RATIO
Creatinine,U: 108.9 mg/dL
Microalb Creat Ratio: 1.4 mg/g (ref 0.0–30.0)
Microalb, Ur: 1.5 mg/dL (ref 0.0–1.9)

## 2013-09-27 NOTE — Assessment & Plan Note (Signed)
BP Readings from Last 3 Encounters:  09/27/13 138/72  07/17/13 138/84  03/25/13 128/78   BP well controlled. Will check renal function with labs today. Continue Losartan.

## 2013-09-27 NOTE — Progress Notes (Signed)
Subjective:    Patient ID: Sabrina Wilson, female    DOB: 09-Nov-1940, 73 y.o.   MRN: [id number]  HPI 73YO female presents for follow up. Feeling well. No concerns today except for small soft nodular area on right medial neck. Has been present for years. Slightly tender lately. Showed it to her oncologist, who reportedly felt it was fatty tissue. Otherwise, doing well.  HTN - Compliant with Losartan. Does not regularly check BP. No chest pain, palpitations, headache.  Review of Systems  Constitutional: Negative for fever, chills, appetite change, fatigue and unexpected weight change.  HENT: Negative for congestion, ear pain, sinus pressure, sore throat, trouble swallowing and voice change.   Eyes: Negative for visual disturbance.  Respiratory: Negative for cough, shortness of breath, wheezing and stridor.   Cardiovascular: Negative for chest pain, palpitations and leg swelling.  Gastrointestinal: Negative for nausea, vomiting, abdominal pain, diarrhea, constipation, blood in stool, abdominal distention and anal bleeding.  Genitourinary: Negative for dysuria and flank pain.  Musculoskeletal: Negative for arthralgias, gait problem, myalgias and neck pain.  Skin: Negative for color change and rash.  Neurological: Negative for dizziness and headaches.  Hematological: Negative for adenopathy. Does not bruise/bleed easily.  Psychiatric/Behavioral: Negative for suicidal ideas, sleep disturbance and dysphoric mood. The patient is not nervous/anxious.        Objective:    BP 138/72  Pulse 65  Temp(Src) 98.7 F (37.1 C) (Oral)  Ht 5\' 7"  (1.702 m)  Wt 196 lb 8 oz (89.132 kg)  BMI 30.77 kg/m2  SpO2 95% Physical Exam  Constitutional: She is oriented to person, place, and time. She appears well-developed and well-nourished. No distress.  HENT:  Head: Normocephalic and atraumatic.  Right Ear: External ear normal.  Left Ear: External ear normal.  Nose: Nose normal.  Mouth/Throat:  Oropharynx is clear and moist. No oropharyngeal exudate.  Eyes: Conjunctivae are normal. Pupils are equal, round, and reactive to light. Right eye exhibits no discharge. Left eye exhibits no discharge. No scleral icterus.  Neck: Normal range of motion. Neck supple. No tracheal deviation present. No thyromegaly present.    Cardiovascular: Normal rate, regular rhythm, normal heart sounds and intact distal pulses.  Exam reveals no gallop and no friction rub.   No murmur heard. Pulmonary/Chest: Effort normal and breath sounds normal. No accessory muscle usage. Not tachypneic. No respiratory distress. She has no decreased breath sounds. She has no wheezes. She has no rhonchi. She has no rales. She exhibits no tenderness.  Musculoskeletal: Normal range of motion. She exhibits no edema and no tenderness.  Lymphadenopathy:    She has no cervical adenopathy.  Neurological: She is alert and oriented to person, place, and time. No cranial nerve deficit. She exhibits normal muscle tone. Coordination normal.  Skin: Skin is warm and dry. No rash noted. She is not diaphoretic. No erythema. No pallor.  Psychiatric: She has a normal mood and affect. Her behavior is normal. Judgment and thought content normal.          Assessment & Plan:   Problem List Items Addressed This Visit     Unprioritized   Hypertension - Primary      BP Readings from Last 3 Encounters:  09/27/13 138/72  07/17/13 138/84  03/25/13 128/78   BP well controlled. Will check renal function with labs today. Continue Losartan.    Relevant Orders      Microalbumin / creatinine urine ratio      Comprehensive metabolic panel   Lipoma  of neck     Nodule most consistent with lipoma. Will monitor. If any change in size, discussed surgical evaluation for biopsy.     Other Visit Diagnoses   Need for prophylactic vaccination against Streptococcus pneumoniae (pneumococcus)        Relevant Orders       Pneumococcal conjugate vaccine  13-valent (Completed)        Return in about 6 months (around 03/29/2014) for Wellness Visit.

## 2013-09-27 NOTE — Assessment & Plan Note (Signed)
Nodule most consistent with lipoma. Will monitor. If any change in size, discussed surgical evaluation for biopsy.

## 2013-09-27 NOTE — Progress Notes (Signed)
Pre visit review using our clinic review tool, if applicable. No additional management support is needed unless otherwise documented below in the visit note. 

## 2013-09-27 NOTE — Patient Instructions (Signed)
We will check labs today.  Prevnar vaccine today.  Follow up in 6 months.

## 2013-09-30 ENCOUNTER — Other Ambulatory Visit (INDEPENDENT_AMBULATORY_CARE_PROVIDER_SITE_OTHER): Payer: Medicare Other

## 2013-09-30 ENCOUNTER — Telehealth: Payer: Self-pay | Admitting: Internal Medicine

## 2013-09-30 DIAGNOSIS — I10 Essential (primary) hypertension: Secondary | ICD-10-CM

## 2013-09-30 LAB — BASIC METABOLIC PANEL
BUN: 24 mg/dL — AB (ref 6–23)
CHLORIDE: 103 meq/L (ref 96–112)
CO2: 28 meq/L (ref 19–32)
CREATININE: 0.9 mg/dL (ref 0.4–1.2)
Calcium: 10.1 mg/dL (ref 8.4–10.5)
GFR: 70.14 mL/min (ref >60.00)
GLUCOSE: 91 mg/dL (ref 70–99)
Potassium: 4.7 mEq/L (ref 3.5–5.1)
Sodium: 138 mEq/L (ref 135–145)

## 2013-09-30 NOTE — Telephone Encounter (Signed)
Relevant patient education assigned to patient using Emmi. ° °

## 2013-11-22 ENCOUNTER — Ambulatory Visit: Payer: Self-pay | Admitting: Internal Medicine

## 2013-11-22 ENCOUNTER — Telehealth: Payer: Self-pay | Admitting: Internal Medicine

## 2013-11-22 NOTE — Telephone Encounter (Signed)
Patient Information:  Caller Name: Joeann  Phone: [phone]  Patient: Sabrina Wilson, Sabrina Wilson  Gender: Female  DOB: 05/19/1940  Age: 73 Years  PCP: Ronette Deter (Adults only)  Office Follow Up:  Does the office need to follow up with this patient?: Yes  Instructions For The Office: Pt is refusing any other [place] locations for an appt for today.  Please call pt regarding an appt   Symptoms  Reason For Call & Symptoms: Pt called back after being disconnected from previous call regarding sore throat and congestion with fever; coughing up yellow green mucus  Reviewed Health History In EMR: Yes  Reviewed Medications In EMR: Yes  Reviewed Allergies In EMR: Yes  Reviewed Surgeries / Procedures: Yes  Date of Onset of Symptoms: 11/15/2013  Treatments Tried: Nyquil  Treatments Tried Worked: No  Any Fever: Yes  Fever Taken: Oral  Fever Time Of Reading: 10:30:00  Fever Last Reading: 100.5  Guideline(s) Used:  Colds  Disposition Per Guideline:   Go to Office Now  Reason For Disposition Reached:   Fever > 100.5 F (38.1 C) and over 46 years of age  Advice Given:  Call Back If:  You become worse  Patient Will Follow Care Advice:  YES

## 2013-11-22 NOTE — Telephone Encounter (Signed)
Patient Information:  Caller Name: Natha  Phone: [phone]  Patient: Sabrina, Wilson  Gender: Female  DOB: Feb 01, 1941  Age: 73 Years  PCP: Ronette Deter (Adults only)  Office Follow Up:  Does the office need to follow up with this patient?: No  Instructions For The Office: N/A  RN Note:  Line disconnected and unable to complete triage.  Attempted to reach pt several times but continued to receive VM.  Message left to call office back.  Symptoms  Reason For Call & Symptoms: Pt is calling and states that she has a sore throat, ears hurting, headache, head congestion, coughing up yellow green drainage and also having green eye discharge; sx started 11/15/13;  temp 99-100.5  Reviewed Health History In EMR: Yes  Reviewed Medications In EMR: Yes  Reviewed Allergies In EMR: Yes  Reviewed Surgeries / Procedures: Yes  Date of Onset of Symptoms: 11/15/2013  Treatments Tried: Nyquil  Treatments Tried Worked: No  Any Fever: Yes  Fever Taken: Oral  Fever Time Of Reading: 10:25:00  Fever Last Reading: 100.5  Guideline(s) Used:  No Protocol Available - Information Only  Disposition Per Guideline:   Home Care  Reason For Disposition Reached:   Information only question and nurse able to answer  Advice Given:  N/A  RN Overrode Recommendation:  Document Patient  Unable to triage due to phone disconnected and unable to reach pt.  VM left

## 2013-11-22 NOTE — Telephone Encounter (Signed)
Talked with patient on phone and patient denied fever at this time, patient denied SOB. patient did complain of cough and congestion advised patient no appointment available in office today that if she develops fever chills or symptoms worsen to go to ER immediately patient voiced she would follow advice. Set up appointment for patient to  See NP 11/25/13.

## 2013-11-23 ENCOUNTER — Ambulatory Visit: Payer: Self-pay | Admitting: Internal Medicine

## 2013-11-25 ENCOUNTER — Ambulatory Visit (INDEPENDENT_AMBULATORY_CARE_PROVIDER_SITE_OTHER): Payer: Medicare Other | Admitting: Adult Health

## 2013-11-25 ENCOUNTER — Encounter: Payer: Self-pay | Admitting: Adult Health

## 2013-11-25 VITALS — BP 132/78 | HR 81 | Temp 98.9°F | Resp 16 | Ht 67.0 in | Wt 193.5 lb

## 2013-11-25 DIAGNOSIS — J029 Acute pharyngitis, unspecified: Secondary | ICD-10-CM

## 2013-11-25 DIAGNOSIS — J019 Acute sinusitis, unspecified: Secondary | ICD-10-CM

## 2013-11-25 LAB — POCT RAPID STREP A (OFFICE): Rapid Strep A Screen: NEGATIVE

## 2013-11-25 LAB — CEA: CEA: 0.8 ng/mL (ref 0.0–4.7)

## 2013-11-25 MED ORDER — AMOXICILLIN-POT CLAVULANATE 875-125 MG PO TABS: 1.0000 | ORAL_TABLET | Freq: Two times a day (BID) | ORAL | Status: DC

## 2013-11-25 NOTE — Progress Notes (Signed)
Pre visit review using our clinic review tool, if applicable. No additional management support is needed unless otherwise documented below in the visit note. 

## 2013-11-25 NOTE — Patient Instructions (Signed)
  Start Augmentin twice a day for 7 days.  Drink plenty of water to maintain hydration.  You can take an over the counter cough medication such as Robitussin or Delsym. Look for Guaifenesin as one of the ingredients. This will help liquify your secretions and help make it easier to cough up.  Tylenol or Aleve for fever or general discomfort associated with being sick.  Please call if no improvement within 4-5 days or sooner if necessary.

## 2013-11-25 NOTE — Progress Notes (Signed)
   Subjective:    Patient ID: Sabrina Wilson, female    DOB: Mar 11, 1941, 74 y.o.   MRN: [id number]  HPI  Pt is a pleasant 74 y/o female who presents to clinic with sore throat, cough, congestion, sinus drainage ongoing for > 10 days. Reports cough is productive of "multi-colored secretions". She has had clear secretions, yellow secretions as well as green secretions. No fever or chills. Has been taking NyQuil but wakes around 6 AM coughing.   Review of Systems  Constitutional: Negative for fever and chills.  HENT: Positive for congestion, postnasal drip, rhinorrhea, sinus pressure and sore throat.   Respiratory: Positive for cough. Negative for chest tightness and wheezing.   All other systems reviewed and are negative.      Objective:   Physical Exam  Constitutional: She is oriented to person, place, and time. She appears well-developed and well-nourished. No distress.  HENT:  Head: Normocephalic and atraumatic.  Right Ear: External ear normal.  Left Ear: External ear normal.  Mouth/Throat: No oropharyngeal exudate.  Cardiovascular: Normal rate, regular rhythm and normal heart sounds.  Exam reveals no gallop.   No murmur heard. Pulmonary/Chest: Effort normal and breath sounds normal. No respiratory distress. She has no wheezes. She has no rales.  Lymphadenopathy:    She has cervical adenopathy.  Neurological: She is alert and oriented to person, place, and time.  Psychiatric: She has a normal mood and affect. Her behavior is normal. Judgment and thought content normal.      Assessment & Plan:   1. Acute sinusitis with symptoms greater than 10 days Symptoms ongoing for greater than 10 days now feels she has in her chest. Start Augmentin bid x 10 days. OTC cough medication with guaifenesin. Tylenol or Aleve for general discomfort or fever. RTC if no improvement in 4-5 days or sooner if necessary.  2. Sore throat Negative for strep. - POCT rapid strep A

## 2014-02-13 ENCOUNTER — Ambulatory Visit: Payer: Self-pay | Admitting: Internal Medicine

## 2014-02-24 ENCOUNTER — Encounter: Payer: Self-pay | Admitting: Adult Health

## 2014-02-25 ENCOUNTER — Ambulatory Visit: Payer: Self-pay | Admitting: Internal Medicine

## 2014-02-25 LAB — HM MAMMOGRAPHY: HM MAMMO: ABNORMAL

## 2014-03-04 ENCOUNTER — Ambulatory Visit (INDEPENDENT_AMBULATORY_CARE_PROVIDER_SITE_OTHER): Payer: Medicare Other | Admitting: General Surgery

## 2014-03-04 ENCOUNTER — Other Ambulatory Visit: Payer: Medicare Other

## 2014-03-04 ENCOUNTER — Encounter: Payer: Self-pay | Admitting: General Surgery

## 2014-03-04 VITALS — BP 138/80 | HR 76 | Resp 14 | Ht 67.0 in | Wt 197.0 lb

## 2014-03-04 DIAGNOSIS — N6001 Solitary cyst of right breast: Secondary | ICD-10-CM

## 2014-03-04 DIAGNOSIS — N63 Unspecified lump in unspecified breast: Secondary | ICD-10-CM

## 2014-03-04 NOTE — Patient Instructions (Signed)
Patient to return in 6 weeks for follow up. The patient is aware to call back for any questions or concerns.  

## 2014-03-04 NOTE — Progress Notes (Signed)
Patient ID: Sabrina Wilson, female   DOB: 01-08-1941, 73 y.o.   MRN: [id number]  Chief Complaint  Patient presents with  . Follow-up    abnormal mammogram    HPI Sabrina Wilson is a 73 y.o. female who presents for a breast evaluation. The most recent mammogram and right breast ultrasound was done on 02/25/14.  Patient does not perform regular self breast checks but does get regular mammograms done.  She states that approximately 1 week prior to her mammogram being done she had some right breast pain that subsided a couple days after. No other problems or complaints at this time. No injury to the breasts. The pain was described as a dull ache. The pain was located on the right nipple.    HPI  Past Medical History  Diagnosis Date  . Colon cancer 2009    s/p resection and chemotherapy by Dr. Cynda Acres  . Polyp at cervical os     s/p resection Dr. Sabra Heck  . Hernia of flank     Past Surgical History  Procedure Laterality Date  . Colectomy    . Hernia repair  07/13/12    ventral   . Dilation and curettage of uterus  2014    Dr. Sabra Heck, benign per pt    Family History  Problem Relation Age of Onset  . Stroke Mother   . Diabetes Mother   . Cancer Father     colon  . Cancer Brother     colon  . Cancer Brother     brain    Social History History  Substance Use Topics  . Smoking status: Former Smoker    Quit date: 10/12/2007  . Smokeless tobacco: Never Used  . Alcohol Use: 0.6 oz/week    1 Not specified per week     Comment: with dinner    Allergies  Allergen Reactions  . Sulfa Antibiotics Hives    Current Outpatient Prescriptions  Medication Sig Dispense Refill  . losartan (COZAAR) 100 MG tablet Take 1 tablet (100 mg total) by mouth daily. 90 tablet 4  . VITAMIN D, ERGOCALCIFEROL, PO 2 tablets daily.       No current facility-administered medications for this visit.    Review of Systems Review of Systems  Constitutional: Negative.   Respiratory: Negative.    Cardiovascular: Negative.     Blood pressure 138/80, pulse 76, resp. rate 14, height 5\' 7"  (1.702 m), weight 197 lb (89.359 kg).  Physical Exam Physical Exam  Constitutional: She is oriented to person, place, and time. She appears well-developed and well-nourished.  Neck: Neck supple. No thyromegaly present.  Cardiovascular: Normal rate, regular rhythm and normal heart sounds.   No murmur heard. Pulmonary/Chest: Effort normal and breath sounds normal. Right breast exhibits mass (1.5 cm nodule just below the nipple at 6 o'clock). Right breast exhibits no inverted nipple, no nipple discharge, no skin change and no tenderness. Left breast exhibits no inverted nipple, no mass, no nipple discharge, no skin change and no tenderness.    Abdominal: Soft. Normal appearance and bowel sounds are normal. There is no hepatosplenomegaly. There is no tenderness. No hernia.  Lymphadenopathy:    She has no cervical adenopathy.    She has no axillary adenopathy.  Neurological: She is alert and oriented to person, place, and time.  Skin: Skin is warm and dry.    Data Reviewed Screening mammograms dated 02/13/2014 showed a possible mass in the right breast. Otherwise unremarkable. By red-0.  Focal  spot compression views and ultrasound dated 02/25/2014 showed a 2.4 cm oval isodense partially circumscribed subareolar mass. Ultrasound showed a 1.7 x 2.2 x 1.3 cm complex cyst with multiple septations. Hospital internal vascularity. Ultrasound-guided biopsy recommended. By red-4.  Ultrasound examination of the area of palpable abnormality within the right breast showed a 1.2 x 1.7 x 2.1 cm hypoechoic mass with posterior acoustic enhancement, sharp edge effect and at least one septation. The patient was amenable to aspiration. 1 mL of 1% plain Xylocaine was utilized. Aspiration was completed with a 22 mL needle with near complete resolution. 4 mL of turbid brown fluid was obtained, diluted with an equal volume of  cytology fixative and sent for cytologic review. The procedure was well-tolerated. The area of palpable abnormality was no longer appreciated. BI-RADS-3.  Assessment    Right breast cyst, possibly related to trauma. No history of exogenous estrogen use.       Plan    The patient will be contacted when cytology results are available. Assuming a benign report by follow-up exam in 6 weeks with repeat ultrasound will be completed to confirm the area does not reaccumulate.     PCP:  Ronette Deter Ref. MD: Dr. Raynelle Bring, [place] Gleason 03/04/2014, 9:13 AM

## 2014-03-05 LAB — FINE-NEEDLE ASPIRATION

## 2014-03-06 ENCOUNTER — Ambulatory Visit: Payer: Self-pay | Admitting: Gastroenterology

## 2014-03-07 ENCOUNTER — Telehealth: Payer: Self-pay | Admitting: General Surgery

## 2014-03-07 NOTE — Telephone Encounter (Signed)
Patient notified cytology was fine. Tolerated procedure well. Follow-up  In 6 weeks as scheduled.

## 2014-03-31 ENCOUNTER — Encounter: Payer: Self-pay | Admitting: Internal Medicine

## 2014-03-31 ENCOUNTER — Ambulatory Visit (INDEPENDENT_AMBULATORY_CARE_PROVIDER_SITE_OTHER): Payer: Medicare Other | Admitting: Internal Medicine

## 2014-03-31 VITALS — BP 122/60 | HR 73 | Temp 98.5°F | Ht 66.5 in | Wt 196.5 lb

## 2014-03-31 DIAGNOSIS — I10 Essential (primary) hypertension: Secondary | ICD-10-CM

## 2014-03-31 DIAGNOSIS — Z0000 Encounter for general adult medical examination without abnormal findings: Secondary | ICD-10-CM

## 2014-03-31 LAB — COMPREHENSIVE METABOLIC PANEL
ALK PHOS: 54 U/L (ref 39–117)
ALT: 13 U/L (ref 0–35)
AST: 20 U/L (ref 0–37)
Albumin: 4 g/dL (ref 3.5–5.2)
BUN: 19 mg/dL (ref 6–23)
CO2: 29 meq/L (ref 19–32)
Calcium: 9.6 mg/dL (ref 8.4–10.5)
Chloride: 104 mEq/L (ref 96–112)
Creatinine, Ser: 0.9 mg/dL (ref 0.4–1.2)
GFR: 63.14 mL/min (ref >60.00)
Glucose, Bld: 106 mg/dL — ABNORMAL HIGH (ref 70–99)
Potassium: 4.6 mEq/L (ref 3.5–5.1)
SODIUM: 139 meq/L (ref 135–145)
TOTAL PROTEIN: 6.8 g/dL (ref 6.0–8.3)
Total Bilirubin: 0.7 mg/dL (ref 0.2–1.2)

## 2014-03-31 LAB — CBC WITH DIFFERENTIAL/PLATELET
Basophils Absolute: 0 10*3/uL (ref 0.0–0.1)
Basophils Relative: 0.7 % (ref 0.0–3.0)
Eosinophils Absolute: 0.3 10*3/uL (ref 0.0–0.7)
Eosinophils Relative: 5.6 % — ABNORMAL HIGH (ref 0.0–5.0)
HEMATOCRIT: 37.4 % (ref 36.0–46.0)
Hemoglobin: 12.1 g/dL (ref 12.0–15.0)
Lymphocytes Relative: 28.1 % (ref 12.0–46.0)
Lymphs Abs: 1.7 10*3/uL (ref 0.7–4.0)
MCHC: 32.3 g/dL (ref 30.0–36.0)
MCV: 95.5 fl (ref 78.0–100.0)
MONO ABS: 0.5 10*3/uL (ref 0.1–1.0)
MONOS PCT: 8.6 % (ref 3.0–12.0)
Neutro Abs: 3.5 10*3/uL (ref 1.4–7.7)
Neutrophils Relative %: 57 % (ref 43.0–77.0)
PLATELETS: 259 10*3/uL (ref 150.0–400.0)
RBC: 3.92 Mil/uL (ref 3.87–5.11)
RDW: 14.2 % (ref 11.5–15.5)
WBC: 6.2 10*3/uL (ref 4.0–10.5)

## 2014-03-31 LAB — MICROALBUMIN / CREATININE URINE RATIO
Creatinine,U: 106.5 mg/dL
MICROALB UR: 0.8 mg/dL (ref 0.0–1.9)
Microalb Creat Ratio: 0.8 mg/g (ref 0.0–30.0)

## 2014-03-31 LAB — LIPID PANEL
Cholesterol: 252 mg/dL — ABNORMAL HIGH (ref 0–200)
HDL: 106.4 mg/dL (ref >39.00)
LDL CALC: 133 mg/dL — AB (ref 0–99)
NONHDL: 145.6
Total CHOL/HDL Ratio: 2
Triglycerides: 61 mg/dL (ref 0.0–149.0)
VLDL: 12.2 mg/dL (ref 0.0–40.0)

## 2014-03-31 MED ORDER — LOSARTAN POTASSIUM 100 MG PO TABS
100.0000 mg | ORAL_TABLET | Freq: Every day | ORAL | Status: DC
Start: 2014-03-31 — End: 2015-05-23

## 2014-03-31 NOTE — Assessment & Plan Note (Signed)
General medical exam including breast exam normal today. Pap and pelvic deferred as these were completed by her gynecologist oncologist in 2014 and were reportedly normal. Mammogram is up-to-date. Colonoscopy is up-to-date. Will check labs including CBC, CMP, lipid profile. Encouraged continued healthy diet and regular exercise. Appropriate screening performed.

## 2014-03-31 NOTE — Progress Notes (Signed)
Subjective:    Patient ID: Sabrina Wilson, female    DOB: 10/21/1940, 73 y.o.   MRN: [id number]  HPI The patient is here for annual Medicare wellness examination and management of other chronic and acute problems.   The risk factors are reflected in the social history.  The roster of all physicians providing medical care to patient - is listed in the Snapshot section of the chart.  Activities of daily living:  The patient is 100% independent in all ADLs: dressing, toileting, feeding as well as independent mobility. Lives in a two story home with husband. No pets.  Home safety : The patient has smoke detectors in the home. They wear seatbelts.  There are no firearms at home. There is no violence in the home.   There is no risks for hepatitis, STDs or HIV. There is a history of blood transfusion during cancer treatment. They have no travel history to infectious disease endemic areas of the world.  The patient has seen their dentist in the last six month. ([place]) They have seen their eye doctor in the last year. Pt notes cataract in right eye. [facility]) No issues with hearing. They have deferred audiologic testing in the last year.   They do not  have excessive sun exposure. Discussed the need for sun protection: hats, long sleeves and use of sunscreen if there is significant sun exposure. Dermatologist - Dr. Nehemiah Massed General Surgery - Dr. Bary Castilla  Diet: the importance of a healthy diet is discussed. They do have a healthy diet.  The benefits of regular aerobic exercise were discussed. She walks occasionally.  Depression screen: there are no signs or vegative symptoms of depression- irritability, change in appetite, anhedonia, sadness/tearfullness.  Cognitive assessment: the patient manages all their financial and personal affairs and is actively engaged. They could relate day,date,year and events.  The following portions of the patient's history were reviewed  and updated as appropriate: allergies, current medications, past family history, past medical history,  past surgical history, past social history  and problem list.  Visual acuity was not assessed per patient preference since she has regular follow up with her ophthalmologist. Hearing and body mass index were assessed and reviewed.   During the course of the visit the patient was educated and counseled about appropriate screening and preventive services including : fall prevention , diabetes screening, nutrition counseling, colorectal cancer screening, and recommended immunizations.    FOLLOW UP: Had breast biopsy with Dr. Bary Castilla. Biopsy was benign. Follow up scheduled.  Also has occasional right leg numbness and tingling. Symptoms exacerbated by lying flat. Does not want to pursue additional testing for this at this time. No weakness noted.  Past medical, surgical, family and social history per today's encounter.  Review of Systems  Constitutional: Negative for fever, chills, appetite change, fatigue and unexpected weight change.  Eyes: Negative for visual disturbance.  Respiratory: Negative for shortness of breath.   Cardiovascular: Negative for chest pain and leg swelling.  Gastrointestinal: Negative for nausea, vomiting, abdominal pain, diarrhea and constipation.  Musculoskeletal: Positive for myalgias and back pain (occasional low back pain). Negative for arthralgias.  Skin: Negative for color change and rash.  Neurological: Positive for numbness (occasional right leg). Negative for weakness.  Hematological: Negative for adenopathy. Does not bruise/bleed easily.  Psychiatric/Behavioral: Negative for sleep disturbance and dysphoric mood. The patient is not nervous/anxious.        Objective:    BP 122/60 mmHg  Pulse 73  Temp(Src) 98.5 F (36.9 C) (Oral)  Ht 5' 6.5" (1.689 m)  Wt 196 lb 8 oz (89.132 kg)  BMI 31.24 kg/m2  SpO2 97% Physical Exam  Constitutional: She is  oriented to person, place, and time. She appears well-developed and well-nourished. No distress.  HENT:  Head: Normocephalic and atraumatic.  Right Ear: External ear normal.  Left Ear: External ear normal.  Nose: Nose normal.  Mouth/Throat: Oropharynx is clear and moist. No oropharyngeal exudate.  Eyes: Conjunctivae are normal. Pupils are equal, round, and reactive to light. Right eye exhibits no discharge. Left eye exhibits no discharge. No scleral icterus.  Neck: Normal range of motion. Neck supple. No tracheal deviation present. No thyromegaly present.  Cardiovascular: Normal rate, regular rhythm, normal heart sounds and intact distal pulses.  Exam reveals no gallop and no friction rub.   No murmur heard. Pulmonary/Chest: Effort normal and breath sounds normal. No accessory muscle usage. No tachypnea. No respiratory distress. She has no decreased breath sounds. She has no wheezes. She has no rales. She exhibits no tenderness. Right breast exhibits no inverted nipple, no mass, no nipple discharge, no skin change and no tenderness. Left breast exhibits no inverted nipple, no mass, no nipple discharge, no skin change and no tenderness. Breasts are symmetrical.  Abdominal: Soft. Bowel sounds are normal. She exhibits no distension and no mass. There is no tenderness. There is no rebound and no guarding.  Musculoskeletal: Normal range of motion. She exhibits no edema or tenderness.  Lymphadenopathy:    She has no cervical adenopathy.  Neurological: She is alert and oriented to person, place, and time. No cranial nerve deficit. She exhibits normal muscle tone. Coordination normal.  Skin: Skin is warm and dry. No rash noted. She is not diaphoretic. No erythema. No pallor.  Psychiatric: She has a normal mood and affect. Her behavior is normal. Judgment and thought content normal.          Assessment & Plan:   Problem List Items Addressed This Visit      Unprioritized   Hypertension    Relevant Medications      losartan (COZAAR) tablet   Medicare annual wellness visit, subsequent - Primary    General medical exam including breast exam normal today. Pap and pelvic deferred as these were completed by her gynecologist oncologist in 2014 and were reportedly normal. Mammogram is up-to-date. Colonoscopy is up-to-date. Will check labs including CBC, CMP, lipid profile. Encouraged continued healthy diet and regular exercise. Appropriate screening performed.      Relevant Orders      CBC with Differential      Comprehensive metabolic panel      Lipid panel      Microalbumin / creatinine urine ratio      Vit D  25 hydroxy (rtn osteoporosis monitoring)       Return in about 6 months (around 09/30/2014) for Recheck.

## 2014-03-31 NOTE — Progress Notes (Signed)
Pre visit review using our clinic review tool, if applicable. No additional management support is needed unless otherwise documented below in the visit note. 

## 2014-03-31 NOTE — Patient Instructions (Signed)

## 2014-04-01 LAB — VITAMIN D 25 HYDROXY (VIT D DEFICIENCY, FRACTURES): VITD: 45.97 ng/mL (ref 30.00–100.00)

## 2014-04-14 ENCOUNTER — Encounter: Payer: Self-pay | Admitting: General Surgery

## 2014-04-14 ENCOUNTER — Ambulatory Visit: Payer: Medicare Other

## 2014-04-14 ENCOUNTER — Ambulatory Visit (INDEPENDENT_AMBULATORY_CARE_PROVIDER_SITE_OTHER): Payer: Medicare Other | Admitting: General Surgery

## 2014-04-14 VITALS — BP 138/70 | HR 76 | Resp 12 | Ht 67.0 in | Wt 193.0 lb

## 2014-04-14 DIAGNOSIS — N6009 Solitary cyst of unspecified breast: Secondary | ICD-10-CM | POA: Insufficient documentation

## 2014-04-14 DIAGNOSIS — N6001 Solitary cyst of right breast: Secondary | ICD-10-CM

## 2014-04-14 NOTE — Patient Instructions (Signed)
Patient to return in 6 weeks right breast ultrasound.

## 2014-04-14 NOTE — Progress Notes (Signed)
Patient ID: Sabrina Wilson, female   DOB: 1940-07-17, 73 y.o.   MRN: [id number]  Chief Complaint  Patient presents with  . Follow-up    right breast cyst    HPI Sabrina Wilson is a 73 y.o. female here today following up from her two week FNA right breast cyst. She states she feels no lumps and not tender. The patient had a screening mammogram showing a new daily in the retroareolar area of the right breast. Aspiration 6 weeks ago showed near complete resolution and cytology of the fluid was benign. This was a planned follow-up to reassess the area. The patient has not been on antiestrogen therapy and is unaware any trauma to the area. HPI  Past Medical History  Diagnosis Date  . Colon cancer 2009    s/p resection and chemotherapy by Dr. Cynda Acres  . Polyp at cervical os     s/p resection Dr. Sabra Heck  . Hernia of flank     Past Surgical History  Procedure Laterality Date  . Colectomy    . Hernia repair  07/13/12    ventral   . Dilation and curettage of uterus  2014    Dr. Sabra Heck, benign per pt    Family History  Problem Relation Age of Onset  . Stroke Mother   . Diabetes Mother   . Cancer Father     colon  . Cancer Brother     colon  . Cancer Brother     brain    Social History History  Substance Use Topics  . Smoking status: Former Smoker    Quit date: 10/12/2007  . Smokeless tobacco: Never Used  . Alcohol Use: 0.6 oz/week    1 Not specified per week     Comment: with dinner    Allergies  Allergen Reactions  . Sulfa Antibiotics Hives    Current Outpatient Prescriptions  Medication Sig Dispense Refill  . losartan (COZAAR) 100 MG tablet Take 1 tablet (100 mg total) by mouth daily. 90 tablet 4  . VITAMIN D, ERGOCALCIFEROL, PO 2 tablets daily.       No current facility-administered medications for this visit.    Review of Systems Review of Systems  Constitutional: Negative.   Respiratory: Negative.   Cardiovascular: Negative.     Blood pressure  138/70, pulse 76, resp. rate 12, height 5\' 7"  (1.702 m), weight 193 lb (87.544 kg).  Physical Exam Physical Exam  Constitutional: She is oriented to person, place, and time. She appears well-developed and well-nourished.  Eyes: Conjunctivae are normal. No scleral icterus.  Neck: Neck supple.  Cardiovascular: Normal rate, regular rhythm and normal heart sounds.   Pulmonary/Chest: Effort normal and breath sounds normal. Right breast exhibits no inverted nipple, no mass, no nipple discharge, no skin change and no tenderness. Left breast exhibits no inverted nipple, no mass, no nipple discharge, no skin change and no tenderness.  Lymphadenopathy:    She has no cervical adenopathy.  Neurological: She is alert and oriented to person, place, and time.  Skin: Skin is warm and dry.    Data Reviewed Ultrasound examination of the right breast at the 6:00 position, 1 cm from the nipple shows partial reaccumulation of fluid with a primarily cystic lesion with a intracystic nodule (not previously identify) is now measuring 0.6 x 0.7 x 1.0 cm. This is significantly decreased in size from her past exam. There appeared to be small satellite lesions measuring up to 0.5 cm.  At the 11:00 position  3 cm from the nipple there is a focal area of hyperechoic tissue, smoothly marginated with some acoustic shadowing measuring 0.41 x 0.42 x 0.47 cm. This area has not been previously imaged. No associated mammographic abnormalities noted. BI-RADS-3.  Assessment    Recurrent breast cyst.    Plan    Options for management were reviewed: 1) vacuum biopsy to confirm no additional pathology versus 2) 6 week follow-up for reassessment by ultrasound. The patient is expecting the arrival of her grandson from [place] later this afternoon. At this time there is no risk for missed opportunity for treatment by delaying biopsy for 6 weeks. We'll plan for reassessment in 6 weeks with repeat ultrasound at that time.  Patient  to return in 6 weeks right breast ultrasound.       Sabrina Wilson 04/14/2014, 9:25 PM

## 2014-05-16 ENCOUNTER — Other Ambulatory Visit: Payer: Self-pay | Admitting: Internal Medicine

## 2014-05-26 ENCOUNTER — Ambulatory Visit (INDEPENDENT_AMBULATORY_CARE_PROVIDER_SITE_OTHER): Payer: Medicare Other | Admitting: General Surgery

## 2014-05-26 ENCOUNTER — Encounter: Payer: Self-pay | Admitting: General Surgery

## 2014-05-26 ENCOUNTER — Ambulatory Visit: Payer: Medicare Other

## 2014-05-26 VITALS — BP 130/70 | HR 74 | Resp 14 | Ht 67.0 in | Wt 189.0 lb

## 2014-05-26 DIAGNOSIS — N6001 Solitary cyst of right breast: Secondary | ICD-10-CM

## 2014-05-26 DIAGNOSIS — N63 Unspecified lump in breast: Secondary | ICD-10-CM

## 2014-05-26 DIAGNOSIS — N6310 Unspecified lump in the right breast, unspecified quadrant: Secondary | ICD-10-CM

## 2014-05-26 HISTORY — PX: BREAST BIOPSY: SHX20

## 2014-05-26 HISTORY — PX: BREAST SURGERY: SHX581

## 2014-05-26 NOTE — Patient Instructions (Signed)

## 2014-05-26 NOTE — Progress Notes (Signed)
Patient ID: Sabrina Wilson, female   DOB: June 04, 1940, 74 y.o.   MRN: [id number]  Chief Complaint  Patient presents with  . Follow-up    right breast cyst    HPI Sabrina Wilson is a 74 y.o. female here today following up from her right breast cyst. Patient had aspiration on 04/14/14. Patient states she thinks the area is doing well. HPI  Past Medical History  Diagnosis Date  . Polyp at cervical os     s/p resection Dr. Sabra Heck  . Hernia of flank   . Colon cancer 2009    T3,N0; s/p resection  Dr. Hulda Humphrey and chemotherapy by Dr. Cynda Acres    Past Surgical History  Procedure Laterality Date  . Colectomy    . Hernia repair  07/13/12    ventral   . Dilation and curettage of uterus  2014    Dr. Sabra Heck, benign per pt    Family History  Problem Relation Age of Onset  . Stroke Mother   . Diabetes Mother   . Cancer Father     colon  . Cancer Brother     colon  . Cancer Brother     brain    Social History History  Substance Use Topics  . Smoking status: Former Smoker    Quit date: 10/12/2007  . Smokeless tobacco: Never Used  . Alcohol Use: 0.6 oz/week    1 Not specified per week     Comment: with dinner    Allergies  Allergen Reactions  . Sulfa Antibiotics Hives    Current Outpatient Prescriptions  Medication Sig Dispense Refill  . losartan (COZAAR) 100 MG tablet TAKE 1 TABLET (100 MG TOTAL) BY MOUTH DAILY. 90 tablet 1  . VITAMIN D, ERGOCALCIFEROL, PO 2 tablets daily.       No current facility-administered medications for this visit.    Review of Systems Review of Systems  Constitutional: Negative.   Respiratory: Negative.   Cardiovascular: Negative.     Blood pressure 130/70, pulse 74, resp. rate 14, height 5\' 7"  (1.702 m), weight 189 lb (85.73 kg).  Physical Exam Physical Exam  Constitutional: She is oriented to person, place, and time. She appears well-developed and well-nourished.  Eyes: Conjunctivae are normal. No scleral icterus.  Neck: Neck supple.   Cardiovascular: Normal rate, regular rhythm and normal heart sounds.   Pulmonary/Chest: Effort normal and breath sounds normal.  Abdominal: Soft. Normal appearance and bowel sounds are normal. There is no hepatomegaly. There is no tenderness.  Lymphadenopathy:    She has no cervical adenopathy.  Neurological: She is alert and oriented to person, place, and time.  Skin: Skin is warm and dry.    Data Reviewed Ultrasound examination of the right breast was completed in the infra-areolar area. Again noted is a primarily cystic area with a solid component on the posterior floor. This is at the 6:00 position 1 cm from the nipple. This measures 0.8 x 1.1 x 1.25 cm in maximal diameter. Considering the persistence and development of a solid component to core biopsy was recommended.  The area was prepped with alcohol and 10 mL of 0.5% Xylocaine with 0.25% Marcaine with 1-200,000 of epinephrine was utilized well tolerated. ChloraPrep was applied to the skin. A 10-gauge Encor device was utilized in the area completely removed. A postbiopsy clip was placed. Scant bleeding was noted. Minimal pain during the procedure. Skin defect was closed with benzoin and Steri-Strips followed by Telfa and Tegaderm dressing.  Assessment  Recurrent breast cyst.    Plan    The patient will be contacted when pathology is available. Written instructions regarding postoperative care were reviewed. Assuming benign results we'll plan for a follow-up exam with nursing staff in one week and physician exam in one month.     PCP: Ronette Deter Ref. MD: Dr. Raynelle Bring, [place] Gleason 05/28/2014, 7:40 AM

## 2014-05-27 ENCOUNTER — Telehealth: Payer: Self-pay | Admitting: *Deleted

## 2014-05-27 NOTE — Telephone Encounter (Signed)
-----   Message from Robert Bellow, MD sent at 05/27/2014 11:20 AM EST ----- Please notify the patient that the biopsy results were entirely benign. Simple fibrous tissue and breast cysts. Nursing follow-up in one week, physician follow-up in 1 month. Thank you ----- Message -----    From: [facility]    Sent: 05/27/2014  10:03 AM      To: Robert Bellow, MD

## 2014-05-27 NOTE — Telephone Encounter (Signed)
Notified patient as instructed, patient pleased. Discussed follow-up appointments, patient agrees  

## 2014-05-28 ENCOUNTER — Encounter: Payer: Self-pay | Admitting: General Surgery

## 2014-06-02 ENCOUNTER — Ambulatory Visit: Payer: Medicare Other

## 2014-06-16 ENCOUNTER — Ambulatory Visit: Payer: Self-pay | Admitting: Internal Medicine

## 2014-06-24 ENCOUNTER — Ambulatory Visit
Admit: 2014-06-24 | Disposition: A | Discharge status: Home / Self Care | Payer: Self-pay | Attending: Internal Medicine | Admitting: Internal Medicine

## 2014-06-25 ENCOUNTER — Encounter: Payer: Self-pay | Admitting: General Surgery

## 2014-06-25 ENCOUNTER — Ambulatory Visit (INDEPENDENT_AMBULATORY_CARE_PROVIDER_SITE_OTHER): Payer: Medicare Other | Admitting: General Surgery

## 2014-06-25 DIAGNOSIS — N6001 Solitary cyst of right breast: Secondary | ICD-10-CM

## 2014-06-25 NOTE — Progress Notes (Signed)
Patient ID: Sabrina Wilson, female   DOB: 09/07/1940, 74 y.o.   MRN: [id number]  Chief Complaint  Patient presents with  . Follow-up    1 month follow up biopsy    HPI Sabrina Wilson is a 74 y.o. female who presents for a 1 month follow up post right breast biopsy. The patient is doing well. No new complaints at this time.   HPI  Past Medical History  Diagnosis Date  . Polyp at cervical os     s/p resection Dr. Sabra Heck  . Hernia of flank   . Colon cancer 2009    T3,N0; s/p resection  Dr. Hulda Humphrey and chemotherapy by Dr. Cynda Acres    Past Surgical History  Procedure Laterality Date  . Colectomy    . Hernia repair  07/13/12    ventral   . Dilation and curettage of uterus  2014    Dr. Sabra Heck, benign per pt  . Breast surgery Right February 2016    Vacuum assisted biopsy for recurrent cyst, fibrocystic changes without evidence of malignancy.    Family History  Problem Relation Age of Onset  . Stroke Mother   . Diabetes Mother   . Cancer Father     colon  . Cancer Brother     colon  . Cancer Brother     brain    Social History History  Substance Use Topics  . Smoking status: Former Smoker    Quit date: 10/12/2007  . Smokeless tobacco: Never Used  . Alcohol Use: 0.6 oz/week    1 Standard drinks or equivalent per week     Comment: with dinner    Allergies  Allergen Reactions  . Sulfa Antibiotics Hives    Current Outpatient Prescriptions  Medication Sig Dispense Refill  . losartan (COZAAR) 100 MG tablet TAKE 1 TABLET (100 MG TOTAL) BY MOUTH DAILY. 90 tablet 1  . VITAMIN D, ERGOCALCIFEROL, PO 2 tablets daily.       No current facility-administered medications for this visit.    Review of Systems Review of Systems  Constitutional: Negative.   Respiratory: Negative.   Cardiovascular: Negative.     Blood pressure 158/74, pulse 82, resp. rate 14, height 5\' 7"  (1.702 m), weight 192 lb (87.091 kg).  Physical Exam Physical Exam  Constitutional: She is oriented  to person, place, and time. She appears well-developed and well-nourished.  Neck: Neck supple. No thyromegaly present.  Pulmonary/Chest: Right breast exhibits no inverted nipple, no mass, no nipple discharge, no skin change and no tenderness. Left breast exhibits no inverted nipple, no mass, no nipple discharge, no skin change and no tenderness.  5 mm ridge at 4 o'clock on right breast.   Lymphadenopathy:    She has no cervical adenopathy.    She has no axillary adenopathy.  Neurological: She is alert and oriented to person, place, and time.  Skin: Skin is warm and dry.      Assessment    Benign breast exam.    Plan    The patient will call if she appreciate any changes on her monthly self examination.  She should resume annual screening mammograms under the care of her primary care physician in October 2016.    PCP:  Sonnie Alamo 06/26/2014, 9:06 PM

## 2014-06-25 NOTE — Patient Instructions (Signed)
The patient is aware to call back for any questions or concerns.  

## 2014-06-26 ENCOUNTER — Encounter: Payer: Self-pay | Admitting: General Surgery

## 2014-07-25 ENCOUNTER — Ambulatory Visit
Admit: 2014-07-25 | Disposition: A | Discharge status: Home / Self Care | Payer: Self-pay | Attending: Internal Medicine | Admitting: Internal Medicine

## 2014-08-15 NOTE — Op Note (Signed)
PATIENT NAME:  Sabrina Wilson, MCGATH MR#:  [id number] DATE OF BIRTH:  1940-05-09  DATE OF PROCEDURE:  07/03/2012  PREOPERATIVE DIAGNOSIS:  AGUS Pap smear with negative office biopsies.   POSTOPERATIVE DIAGNOSIS:  Intrauterine polyp.   PROCEDURES PERFORMED: 1.  Hysteroscopy. 2.  Dilatation and curettage. 3.  Removal of the central cervix.   SURGEON:  Weber Cooks, M.D.   ANESTHESIA:  General.   COMPLICATIONS:  None.   ESTIMATED BLOOD LOSS:  50 mL  INDICATION FOR SURGERY:  The patient is a 73 year old patient who was noted to have a thickened endometrium. Office biopsies were negative. The most recent Pap smear revealed atypical glandular cells of undetermined significance. On colposcopy, no specific lesion could be seen. Therefore, a decision was made to proceed with evaluation of the cervix, hysteroscopy and D and C.  FINDINGS AT TIME OF SURGERY:  Atrophic endometrium with 3 small endometrial polyps. No ulcerations.  Endocervix normal on inspection. Cervix large but normal on palpation. Parametria soft. Uterus small and mobile.  OPERATIVE REPORT:  After adequate general anesthesia had been obtained, the patient was prepped and draped in ski position. The speculum was inserted. The cervix was grasped with a single-tooth tenaculum and dilated. Then the hysteroscope was inserted. After irrigation with sterile water, inspection was done with the above-mentioned findings. This was followed by a sharper curette of the endometrial cavity.  Then 2 holding sutures were placed at 9 o'clock and 3 o'clock off the cervix and using the knife a portion of the cervix was excised. Hemostasis was achieved with a modified Sturmdorf suture anteriorly and posteriorly and was adequate at the end of the procedure.  Palpation was  normal at the end of the procedure.  The patient tolerated the procedure well and was taken to the recovery room in satisfactory condition. Pad, sponge, needle, and instrument  counts were correct x 2.    ____________________________ Weber Cooks, MD bem:ce D: 07/03/2012 16:11:00 ET T: 07/03/2012 17:23:14 ET JOB#: [id number]  cc: Weber Cooks, MD, <Dictator> Weber Cooks MD ELECTRONICALLY SIGNED 07/10/2012 12:59

## 2014-08-15 NOTE — Discharge Summary (Signed)
PATIENT NAME:  Sabrina Wilson, Sabrina Wilson MR#:  [id number] DATE OF BIRTH:  June 04, 1940  DATE OF ADMISSION:  07/13/2012 DATE OF DISCHARGE:  07/18/2012  DISCHARGE DIAGNOSES:  1. Complex ventral hernia.  2. Essential hypertension.  3. Postoperative anemia.  4. Transient hypoxia.   CLINICAL NOTE: This 74 year old woman had previously undergone an emergency colectomy and colostomy for obstructing carcinoma of the sigmoid colon. She then underwent re-anastomosis a year later. She developed multiple ventral hernias as well as a hernia at the previous stoma site. She had been identified with a right ovarian cyst and plans were for repair of  the complex hernia and bilateral oophorectomy.   The patient was taken to the operating room on 07/13/2012, and at that time abdominal exploration showed extensive intra-abdominal adhesions. It was elected to defer elective ovarian cystectomy due to the extensive adhesions in the pelvis. It was necessary to do a left-sided component separation to allow approximation of the posterior rectus sheath in the midline and then placement of a 25 x 30 soft Prolene mesh into the retrorectus space extending over to the component separation area between the transverse abdominis aponeurosis and the internal oblique muscle. The procedure was completed under epidural and general anesthesia. Estimated blood loss was 50 mL. The patient was slow to mobilize and on postoperative day 2, complained of shortness of breath. Evaluation at that time with a chest x-ray showed evidence of some basilar atelectasis. Chest CT was negative for PE. Hemoglobin was noted to be 10. She was treated with vigorous pulmonary toilet and inhaled bronchodilators. Her symptoms resolved. She was started on a clear liquid diet and advanced to a soft diet which she tolerated well. Her abdominal exam remained unremarkable. Postoperative day 5 showed a hemoglobin still of 9.6 with no evidence of bleeding. It was felt to be dilutional  and she was felt to be a candidate for discharge home. Medications at discharge consisted of a resumption of her losartan 100 mg daily as well as her clindamycin based topical ointment, fish oil and tramadol. She was to use her budesonide, formoterol inhaler 1 puff b.i.d. until the present supply is exhausted. Ultram was provided for pain. Home-going instructions were provided. Prior to discharge, both her JP drains placed in the operating room were removed.   CBC on March 26th showed a white blood cell count of 6,300 with normal differential, hemoglobin of 9.8 with an MCV of 97. Echocardiogram completed 07/16/2012 showed normal left ventricular function with an estimated ejection fraction of 55 to 60%., Borderline concentric left ventricular hypertrophy, mild aortic valve sclerosis without stenosis. CT showed no evidence of pulmonary embolus, suggestion of a small area of perinephric fluid, atelectasis versus infiltrate in the right base. Chest x-ray on March 23 at the time of her shortness of breath showed poor aspiration with atelectasis versus mild infiltrate.   Arrangements have been made for outpatient followup in my office.     ____________________________ Robert Bellow, MD jwb:es D: 07/25/2012 11:50:02 ET T: 07/25/2012 12:19:09 ET JOB#: [id number]  cc: Robert Bellow, MD, <Dictator> Eduard Clos. Gilford Rile, MD [place] Amedeo Kinsman MD ELECTRONICALLY SIGNED 07/25/2012 21:52

## 2014-08-15 NOTE — Consult Note (Signed)
PATIENT NAME:  Sabrina Wilson, Sabrina Wilson MR#:  [id number] DATE OF BIRTH:  22-Jan-1941  DATE OF CONSULTATION:  07/16/2012  REFERRING PHYSICIAN:  Dr. Jamal Collin  CONSULTING PHYSICIAN:  Leonie Douglas. Doy Hutching, MD  FAMILY PHYSICIAN: Dr. Ronette Deter.   REASON FOR ADMISSION: Acute onset shortness of breath, postoperatively.   HISTORY OF PRESENT ILLNESS: The patient is a 74 year old female with a significant history of hypertension and previous colon cancer status post colectomy and colostomy who was admitted for incisional hernia repair. The patient underwent hernia repair on March 21. This evening, she developed acute onset shortness of breath. She became quite anxious and hypoxic. She was placed on 2 liters of oxygen. Chest X chest x-ray obtained the surgeons was nondiagnostic, suggesting possible infiltrates versus edema versus atelectasis. Consultation was subsequently requested.   PAST MEDICAL HISTORY: 1.  Colon cancer status post colectomy with colostomy.  2.  Status post recent incisional hernia repair.  3.  Benign hypertension.  4.  Remote history of tobacco abuse.  5.  Peripheral neuropathy.  6.  Previous tonsillectomy.   MEDICATIONS ON ADMISSION:  1.  Losartan 100 mg p.o. daily.  2.  Fish oil 1000 mg p.o. daily.  3.  Vitamin D 1000 units p.o. daily.   ALLERGIES: CODEINE AND SULFA.   SOCIAL HISTORY: The patient quit smoking over 5 years ago. Denies alcohol abuse.   FAMILY HISTORY: Positive for hypertension and heart disease.   REVIEW OF SYSTEMS:  CONSTITUTIONAL: No fever or change in weight.  EYES: No blurred or double vision. No glaucoma.  ENT: No tinnitus or hearing loss. No nasal discharge or bleeding. No difficulty swallowing.  RESPIRATORY: The patient has had cough and wheezing. No hemoptysis. No painful respiration.  CARDIOVASCULAR: No chest pain or orthopnea. No palpitations or syncope.  GASTROINTESTINAL: No nausea, vomiting, or diarrhea. Abdominal pain postop. No change in bowel  habits.  GENITOURINARY: No dysuria or hematuria. No incontinence.  ENDOCRINE: No polyuria or polydipsia. No heat or cold intolerance.  HEMATOLOGIC: The patient denies anemia, easy bruising or bleeding.  LYMPHATIC: No swollen glands.  MUSCULOSKELETAL: The patient denies pain in her neck, back, shoulders, knees or hips. No gout.  NEUROLOGIC: No numbness or migraines. Denies stroke or seizures.  PSYCHIATRIC: The patient denies anxiety, insomnia or depression.   PHYSICAL EXAMINATION: GENERAL: The patient is in moderate respiratory distress.  VITAL SIGNS: Currently remarkable for a blood pressure of 187/89 with a heart rate of 112, respiratory rate of 27. Sat 97% on 2 liters. She is afebrile.  HEENT: Normocephalic, atraumatic. Pupils equally round, reactive to light and accommodation. Extraocular movements are intact. Sclerae are not icteric. Conjunctivae are clear. Oropharynx is clear.   NECK: Supple without JVD. No adenopathy or thyromegaly is noted.  LUNGS: Scattered wheezes and rhonchi without rales or dullness. Respiratory effort is mildly increased.  CARDIAC: Rapid rate with regular rhythm. Normal S1 and S2. No significant rubs, murmurs or gallops. PMI is nondisplaced. Chest wall is nontender.  ABDOMEN: Somewhat distended but soft. Bowel sounds are present. No obvious organomegaly noted.  EXTREMITIES: Without clubbing, cyanosis, edema. Pulses were 2+ bilaterally.  SKIN: Warm and dry without rash or lesions.  NEUROLOGIC: Cranial nerves II through XII grossly intact. Deep tendon reflexes were symmetric. Motor and sensory examination is nonfocal.  PSYCHIATRIC: The patient was alert and oriented to person, place, and time. She was cooperative and used good judgment.   LABORATORY DATA: EKG revealed sinus rhythm with PACs and PVCs with no acute ischemic changes. There  is some suggestion of an old septal infarct.   Chest x-ray revealed poor inspiration with atelectasis versus mild infiltrate at the  lung bases. Mild congestive heart failure could not be excluded.   ASSESSMENT: 1.  Acute respiratory distress with hypoxemia.  2.  Postoperative from incisional hernia repair.  3.  Remote history of tobacco abuse.  4.  Tachycardia.  5.  Essential hypertension.   PLAN: The patient is on Lovenox. We will obtain a CT of the chest to rule out pulmonary embolism. We will begin empiric SVNs with IV antibiotics. We will give one dose of Lasix and one dose of Solu-Medrol. We will send off a troponin. We will use Ativan as needed for anxiety. Begin empiric nitrates and beta blocker therapy. Add Symbicort. Further treatment and evaluation will depend upon the patient's progress.   Thank you for the consultation. We will continue to follow this patient with you while in the hospital. Please call if questions arise.      ____________________________ Leonie Douglas. Doy Hutching, MD jds:cc D: 07/15/2012 38:33:38 ET T: 07/16/2012 00:06:00 ET JOB#: [id number]  cc: Leonie Douglas. Doy Hutching, MD, <Dictator> Eduard Clos. Gilford Rile, MD Rodriquez Thorner Lennice Sites MD ELECTRONICALLY SIGNED 07/16/2012 7:51

## 2014-08-15 NOTE — Op Note (Signed)
PATIENT NAME:  Sabrina Wilson, Sabrina Wilson MR#:  [id number] DATE OF BIRTH:  05-31-1940  DATE OF PROCEDURE:  07/13/2012  PREOPERATIVE DIAGNOSES:  1.  Ventral hernia x 4.  2.  Right ovarian cyst.   POSTOPERATIVE DIAGNOSES:  1.  Ventral hernia x 4.  2.  Right ovarian cyst.   OPERATIVE PROCEDURES:  Exploratory celiotomy, lysis of adhesions, unilateral component separation with placement a 25 x 30 cm soft Prolene mesh.   SURGEON:  Hervey Ard, M.D.   ASSISTANTSynthia Innocent. Sankar.   ANESTHESIA:  Epidural/general under Dr. Andree Elk.   ESTIMATED BLOOD LOSS: 50 mL.  FLUID REPLACEMENT:  2000 mL crystalloid.   CLINICAL NOTE:  This 74 year old woman had perforated colon cancer 4-1/2 years ago. She developed hernias along the previous midline incision, as well as at the old stoma site. She has had a small ovarian cyst evident and plan was for bilateral oophorectomy at the time of her ventral hernia repair.   The patient had TED hose and pneumatic compression stockings for DVT prophylaxis. She received Kefzol prior to incision. Epidural and general anesthesia was established by Dr. Andree Elk. With the patient in supine position, the abdomen was prepped with ChloraPrep and draped. The previous midline incision was opened and extended slightly to get above the most superior hernia. Skin was incised sharply, and the remaining dissection completed with electrocautery. The midline fascia was opened, and the 3 midline and the old stoma site hernia identified, and the sacs excised and discarded. There was a tedious dissection of multiple soft adhesions of the small bowel to the anterior abdominal wall that took approximately 1 hour. Palpation into the pelvis showed that there were numerous loops of small bowel covering the right ovary area, and it was elected not to complete the oophorectomy for fear of causing an enterotomy for a low risk ovarian cyst.  The posterior rectus sheath was freed at the midline and extended to the edge  of the linea semilunaris. There was undue tension, and it was elected to complete a lateral component separation on the left side. This was done by entering the space between the internal oblique and transverse abdominis muscles. The muscle planes were freed to the posterior axillary line. The intra-abdominal adhesions to the anterior abdominal wall were freed for the same distance bilaterally. The midline posterior rectus sheath approximated with interrupted 0 Vicryl figure-of-eight sutures. A 25 x 20 soft Prolene mesh was smoothed into position and anchored at the midline and then with 3 transfascial sutures on each side. 0 PDS suture was used for the transfacial sutures. The mesh was smoothed into position. A slight ruffle in the midportion of the mesh, after the midline external oblique and anterior rectus fascia was approximated, was resolved by grasping the mesh with each fascial stitch. The midline was closed with interrupted 0 PDS figure of eight sutures. A drain was placed between the mesh and the anterior rectus sheath and brought out to the right side of the abdomen. A drain was then placed in the soft tissues, brought out the left side of the abdomen, both anchored in place with 3-0 nylon. The adipose tissue was approximated with multiple layers of running 2-0 Vicryl. The skin was closed with staples.  Tegaderm dressings were placed over the drain sites, and Steri-Strips placed over the transfacial suture sites. Telfa pad, ABD and an abdominal binder were placed. The patient tolerated the procedure well and was taken to the recovery room in stable condition.  ____________________________ Robert Bellow, MD jwb:dmm D: 07/13/2012 12:42:04 ET T: 07/13/2012 13:12:19 ET JOB#: [id number]  cc: Robert Bellow, MD, <Dictator> Weber Cooks, MD Mckinley Jewel, MD Jiyah Torpey Amedeo Kinsman MD ELECTRONICALLY SIGNED 07/13/2012 21:31

## 2014-08-15 NOTE — Consult Note (Signed)
Chief Complaint:  Subjective/Chief Complaint Asked to see pt with post-op SOB. Quit smoking >5 years ago. Denies CP. No cardiac hx. CXR show ?infiltates, ?CHF, and atelectasis per Dr. Rodrigo Ran.   Brief Assessment:  Cardiac Regular   Respiratory wheezing  rhonchi   Gastrointestinal details normal Soft  Bowel sounds normal   Radiology Results: XRay:    23-Mar-14 20:32, Chest Portable Single View  Chest Portable Single View   REASON FOR EXAM:    difficulty breathing  COMMENTS:       PROCEDURE: DXR - DXR PORTABLE CHEST SINGLE VIEW  - Jul 15 2012  8:32PM     RESULT: Comparison made to prior study of 08/31/2009. Poor inspiration with   atelectasis and/or mild infiltrates in the lung bases. Cardiomegaly with   mild pulmonary vascular prominence. A mild component of congestive heart   failure cannot be excluded. No pneumothorax.    IMPRESSION:  Poor inspiration with atelectasis versus mild infiltrates in   lung bases. Mild component of congestive heart failure cannot be excluded.        Verified By: Osa Craver, M.D., MD   Assessment/Plan:  Assessment/Plan:  Plan Will give IV Lasix x 1. Begin SVN's and empiric IV ABX. Supp. O2. CT chest in AM to r/o PE. Will check EKG and troponin.   Electronic Signatures: Idelle Crouch (MD)  (Signed 23-Mar-14 20:55)  Authored: Chief Complaint, Brief Assessment, Radiology Results, Assessment/Plan   Last Updated: 23-Mar-14 20:55 by Idelle Crouch (MD)

## 2014-08-18 LAB — SURGICAL PATHOLOGY

## 2014-09-30 ENCOUNTER — Encounter: Payer: Self-pay | Admitting: Internal Medicine

## 2014-09-30 ENCOUNTER — Ambulatory Visit (INDEPENDENT_AMBULATORY_CARE_PROVIDER_SITE_OTHER): Payer: Medicare Other | Admitting: Internal Medicine

## 2014-09-30 VITALS — BP 144/82 | HR 68 | Temp 98.0°F | Ht 66.5 in | Wt 190.1 lb

## 2014-09-30 DIAGNOSIS — J069 Acute upper respiratory infection, unspecified: Secondary | ICD-10-CM

## 2014-09-30 DIAGNOSIS — Z780 Asymptomatic menopausal state: Secondary | ICD-10-CM | POA: Diagnosis not present

## 2014-09-30 DIAGNOSIS — I10 Essential (primary) hypertension: Secondary | ICD-10-CM

## 2014-09-30 LAB — COMPREHENSIVE METABOLIC PANEL
ALT: 10 U/L (ref 0–35)
AST: 16 U/L (ref 0–37)
Albumin: 4.2 g/dL (ref 3.5–5.2)
Alkaline Phosphatase: 67 U/L (ref 39–117)
BUN: 20 mg/dL (ref 6–23)
CO2: 28 mEq/L (ref 19–32)
Calcium: 9.6 mg/dL (ref 8.4–10.5)
Chloride: 101 mEq/L (ref 96–112)
Creatinine, Ser: 0.97 mg/dL (ref 0.40–1.20)
GFR: 60.05 mL/min (ref >60.00)
Glucose, Bld: 86 mg/dL (ref 70–99)
Potassium: 4.7 mEq/L (ref 3.5–5.1)
SODIUM: 137 meq/L (ref 135–145)
Total Bilirubin: 0.6 mg/dL (ref 0.2–1.2)
Total Protein: 6.8 g/dL (ref 6.0–8.3)

## 2014-09-30 MED ORDER — LOSARTAN POTASSIUM 100 MG PO TABS: ORAL_TABLET | ORAL | Status: DC

## 2014-09-30 NOTE — Assessment & Plan Note (Signed)
Symptoms and exam c/w viral URI. Encouraged continued supportive care. Follow up if symptoms not improving.

## 2014-09-30 NOTE — Progress Notes (Signed)
Pre visit review using our clinic review tool, if applicable. No additional management support is needed unless otherwise documented below in the visit note. 

## 2014-09-30 NOTE — Progress Notes (Signed)
Subjective:    Patient ID: Sabrina Wilson, female    DOB: 1940-07-08, 74 y.o.   MRN: [id number]  HPI  74YO female presents for follow up.  Started with fever, nasal congestion last week. Symptoms improving. Takes occasional Aleve with improvement.  HTN - Compliant with BP medications. No CP, HA, palpitations.  Did not bring record of BP today.   BP Readings from Last 3 Encounters:  09/30/14 144/82  06/25/14 158/74  05/26/14 130/70     Past medical, surgical, family and social history per today's encounter.  Review of Systems  Constitutional: Negative for fever, chills, appetite change, fatigue and unexpected weight change.  HENT: Positive for congestion, postnasal drip and rhinorrhea. Negative for ear discharge, ear pain, hearing loss, sore throat, trouble swallowing and voice change.   Eyes: Negative for visual disturbance.  Respiratory: Positive for cough. Negative for shortness of breath.   Cardiovascular: Negative for chest pain and leg swelling.  Gastrointestinal: Negative for nausea, vomiting, abdominal pain, diarrhea and constipation.  Musculoskeletal: Negative for myalgias and arthralgias.  Skin: Negative for color change and rash.  Hematological: Negative for adenopathy. Does not bruise/bleed easily.  Psychiatric/Behavioral: Negative for sleep disturbance and dysphoric mood. The patient is not nervous/anxious.        Objective:    BP 144/82 mmHg  Pulse 68  Temp(Src) 98 F (36.7 C) (Oral)  Ht 5' 6.5" (1.689 m)  Wt 190 lb 2 oz (86.24 kg)  BMI 30.23 kg/m2  SpO2 99% Physical Exam  Constitutional: She is oriented to person, place, and time. She appears well-developed and well-nourished. No distress.  HENT:  Head: Normocephalic and atraumatic.  Right Ear: External ear normal.  Left Ear: External ear normal.  Nose: Nose normal.  Mouth/Throat: Oropharynx is clear and moist. No oropharyngeal exudate.  Eyes: Conjunctivae are normal. Pupils are equal, round,  and reactive to light. Right eye exhibits no discharge. Left eye exhibits no discharge. No scleral icterus.  Neck: Normal range of motion. Neck supple. No tracheal deviation present. No thyromegaly present.  Cardiovascular: Normal rate, regular rhythm, normal heart sounds and intact distal pulses.  Exam reveals no gallop and no friction rub.   No murmur heard. Pulmonary/Chest: Effort normal and breath sounds normal. No respiratory distress. She has no wheezes. She has no rales. She exhibits no tenderness.  Musculoskeletal: Normal range of motion. She exhibits no edema or tenderness.  Lymphadenopathy:    She has no cervical adenopathy.  Neurological: She is alert and oriented to person, place, and time. No cranial nerve deficit. She exhibits normal muscle tone. Coordination normal.  Skin: Skin is warm and dry. No rash noted. She is not diaphoretic. No erythema. No pallor.  Psychiatric: She has a normal mood and affect. Her behavior is normal. Judgment and thought content normal.          Assessment & Plan:   Problem List Items Addressed This Visit      Unprioritized   Hypertension - Primary    BP Readings from Last 3 Encounters:  09/30/14 144/82  06/25/14 158/74  05/26/14 130/70   BP slightly elevated today, however better controlled at home. Renal function function with labs. Continue Losartan.      Relevant Medications   losartan (COZAAR) 100 MG tablet   Other Relevant Orders   Comprehensive metabolic panel   Postmenopausal estrogen deficiency    Bone density testing ordered.      Relevant Orders   DG Bone Density   Viral  URI    Symptoms and exam c/w viral URI. Encouraged continued supportive care. Follow up if symptoms not improving.          Return in about 6 months (around 04/01/2015) for Wellness Visit.

## 2014-09-30 NOTE — Assessment & Plan Note (Signed)
Bone density testing ordered. 

## 2014-09-30 NOTE — Assessment & Plan Note (Signed)
BP Readings from Last 3 Encounters:  09/30/14 144/82  06/25/14 158/74  05/26/14 130/70   BP slightly elevated today, however better controlled at home. Renal function function with labs. Continue Losartan.

## 2014-09-30 NOTE — Patient Instructions (Signed)
Labs today.  Follow up in 6 months. 

## 2014-12-01 ENCOUNTER — Ambulatory Visit
Admission: RE | Admit: 2014-12-01 | Discharge: 2014-12-01 | Disposition: A | Discharge status: Home / Self Care | Payer: Medicare Other | Source: Ambulatory Visit | Attending: Internal Medicine | Admitting: Internal Medicine

## 2014-12-01 DIAGNOSIS — Z13820 Encounter for screening for osteoporosis: Secondary | ICD-10-CM | POA: Insufficient documentation

## 2014-12-01 DIAGNOSIS — Z780 Asymptomatic menopausal state: Secondary | ICD-10-CM | POA: Diagnosis present

## 2014-12-15 ENCOUNTER — Inpatient Hospital Stay: Payer: Medicare Other | Attending: Family Medicine

## 2014-12-15 ENCOUNTER — Inpatient Hospital Stay: Payer: Medicare Other

## 2014-12-15 DIAGNOSIS — D649 Anemia, unspecified: Secondary | ICD-10-CM | POA: Diagnosis not present

## 2014-12-15 DIAGNOSIS — R970 Elevated carcinoembryonic antigen [CEA]: Secondary | ICD-10-CM | POA: Diagnosis not present

## 2014-12-15 DIAGNOSIS — Z85038 Personal history of other malignant neoplasm of large intestine: Secondary | ICD-10-CM

## 2014-12-16 LAB — CEA: CEA: 1.1 ng/mL (ref 0.0–4.7)

## 2015-01-09 ENCOUNTER — Encounter: Payer: Self-pay | Admitting: *Deleted

## 2015-01-13 ENCOUNTER — Encounter
Admission: RE | Disposition: A | Discharge status: Home / Self Care | Payer: Self-pay | Source: Ambulatory Visit | Attending: Ophthalmology

## 2015-01-13 ENCOUNTER — Ambulatory Visit: Payer: Medicare Other | Admitting: Anesthesiology

## 2015-01-13 ENCOUNTER — Ambulatory Visit
Admission: RE | Admit: 2015-01-13 | Discharge: 2015-01-13 | Disposition: A | Discharge status: Home / Self Care | Payer: Medicare Other | Source: Ambulatory Visit | Attending: Ophthalmology | Admitting: Ophthalmology

## 2015-01-13 ENCOUNTER — Encounter: Payer: Self-pay | Admitting: *Deleted

## 2015-01-13 DIAGNOSIS — I10 Essential (primary) hypertension: Secondary | ICD-10-CM | POA: Insufficient documentation

## 2015-01-13 DIAGNOSIS — H2512 Age-related nuclear cataract, left eye: Secondary | ICD-10-CM | POA: Insufficient documentation

## 2015-01-13 DIAGNOSIS — Z882 Allergy status to sulfonamides status: Secondary | ICD-10-CM | POA: Diagnosis not present

## 2015-01-13 DIAGNOSIS — Z87891 Personal history of nicotine dependence: Secondary | ICD-10-CM | POA: Diagnosis not present

## 2015-01-13 HISTORY — DX: Unspecified osteoarthritis, unspecified site: M19.90

## 2015-01-13 HISTORY — PX: CATARACT EXTRACTION W/PHACO: SHX586

## 2015-01-13 HISTORY — DX: Polyneuropathy, unspecified: G62.9

## 2015-01-13 HISTORY — DX: Essential (primary) hypertension: I10

## 2015-01-13 SURGERY — PHACOEMULSIFICATION, CATARACT, WITH IOL INSERTION
Anesthesia: Monitor Anesthesia Care | Laterality: Left | Wound class: Clean

## 2015-01-13 MED ORDER — MIDAZOLAM HCL 2 MG/2ML IJ SOLN
INTRAMUSCULAR | Status: DC | PRN
  Administered 2015-01-13: 2 mg via INTRAVENOUS

## 2015-01-13 MED ORDER — CEFUROXIME OPHTHALMIC INJECTION 1 MG/0.1 ML
INJECTION | OPHTHALMIC | Status: DC | PRN
  Administered 2015-01-13: 0.1 mL via INTRACAMERAL

## 2015-01-13 MED ORDER — LIDOCAINE HCL (PF) 1 % IJ SOLN
INTRAMUSCULAR | Status: AC
  Filled 2015-01-13: qty 2

## 2015-01-13 MED ORDER — CEFUROXIME OPHTHALMIC INJECTION 1 MG/0.1 ML
INJECTION | OPHTHALMIC | Status: AC
  Filled 2015-01-13: qty 0.1

## 2015-01-13 MED ORDER — MOXIFLOXACIN HCL 0.5 % OP SOLN
OPHTHALMIC | Status: DC | PRN
  Administered 2015-01-13: 2 [drp]

## 2015-01-13 MED ORDER — SODIUM CHLORIDE 0.9 % IV SOLN
INTRAVENOUS | Status: DC
  Administered 2015-01-13: 09:00:00 via INTRAVENOUS

## 2015-01-13 MED ORDER — FENTANYL CITRATE (PF) 100 MCG/2ML IJ SOLN
INTRAMUSCULAR | Status: DC | PRN
  Administered 2015-01-13: 50 ug via INTRAVENOUS

## 2015-01-13 MED ORDER — POVIDONE-IODINE 5 % OP SOLN
OPHTHALMIC | Status: AC
  Administered 2015-01-13: 1 via OPHTHALMIC
  Filled 2015-01-13: qty 30

## 2015-01-13 MED ORDER — POVIDONE-IODINE 5 % OP SOLN
1.0000 "application " | OPHTHALMIC | Status: AC | PRN
  Administered 2015-01-13: 1 via OPHTHALMIC

## 2015-01-13 MED ORDER — ARMC OPHTHALMIC DILATING GEL
OPHTHALMIC | Status: AC
  Administered 2015-01-13: 1 via OPHTHALMIC
  Filled 2015-01-13: qty 0.25

## 2015-01-13 MED ORDER — NA CHONDROIT SULF-NA HYALURON 40-17 MG/ML IO SOLN
INTRAOCULAR | Status: DC | PRN
  Administered 2015-01-13: 1 mL via INTRAOCULAR

## 2015-01-13 MED ORDER — CARBACHOL 0.01 % IO SOLN
INTRAOCULAR | Status: DC | PRN
  Administered 2015-01-13: 0.5 mL via INTRAOCULAR

## 2015-01-13 MED ORDER — EPINEPHRINE HCL 1 MG/ML IJ SOLN
INTRAMUSCULAR | Status: AC
  Filled 2015-01-13: qty 1

## 2015-01-13 MED ORDER — EPINEPHRINE HCL 1 MG/ML IJ SOLN
INTRAMUSCULAR | Status: DC | PRN
  Administered 2015-01-13: 250 mL via OPHTHALMIC

## 2015-01-13 MED ORDER — TETRACAINE HCL 0.5 % OP SOLN
1.0000 [drp] | OPHTHALMIC | Status: AC | PRN
  Administered 2015-01-13: 1 [drp] via OPHTHALMIC

## 2015-01-13 MED ORDER — MOXIFLOXACIN HCL 0.5 % OP SOLN: 1.0000 [drp] | Freq: Once | OPHTHALMIC | Status: DC

## 2015-01-13 MED ORDER — TETRACAINE HCL 0.5 % OP SOLN
OPHTHALMIC | Status: AC
  Administered 2015-01-13: 1 [drp] via OPHTHALMIC
  Filled 2015-01-13: qty 2

## 2015-01-13 MED ORDER — ARMC OPHTHALMIC DILATING GEL
1.0000 "application " | OPHTHALMIC | Status: AC | PRN
  Administered 2015-01-13 (×2): 1 via OPHTHALMIC

## 2015-01-13 MED ORDER — MOXIFLOXACIN HCL 0.5 % OP SOLN
OPHTHALMIC | Status: AC
  Filled 2015-01-13: qty 3

## 2015-01-13 SURGICAL SUPPLY — 22 items
CANNULA ANT/CHMB 27GA (MISCELLANEOUS) IMPLANT
CUP MEDICINE 2OZ PLAST GRAD ST (MISCELLANEOUS) IMPLANT
GLOVE BIO SURGEON STRL SZ8 (GLOVE) ×2 IMPLANT
GLOVE BIOGEL M 6.5 STRL (GLOVE) ×2 IMPLANT
GLOVE SURG LX 8.0 MICRO (GLOVE) ×1
GLOVE SURG LX STRL 8.0 MICRO (GLOVE) ×1 IMPLANT
GOWN STRL REUS W/ TWL LRG LVL3 (GOWN DISPOSABLE) ×2 IMPLANT
GOWN STRL REUS W/TWL LRG LVL3 (GOWN DISPOSABLE) ×2
LENS IOL TECNIS 22.0 (Intraocular Lens) ×2 IMPLANT
LENS IOL TECNIS MONO 1P 22.0 (Intraocular Lens) ×1 IMPLANT
PACK CATARACT (MISCELLANEOUS) ×2 IMPLANT
PACK CATARACT BRASINGTON LX (MISCELLANEOUS) ×2 IMPLANT
PACK EYE AFTER SURG (MISCELLANEOUS) ×2 IMPLANT
SOL BSS BAG (MISCELLANEOUS) ×2
SOL PREP PVP 2OZ (MISCELLANEOUS)
SOLUTION BSS BAG (MISCELLANEOUS) ×1 IMPLANT
SOLUTION PREP PVP 2OZ (MISCELLANEOUS) IMPLANT
SYR 3ML LL SCALE MARK (SYRINGE) ×2 IMPLANT
SYR 5ML LL (SYRINGE) ×2 IMPLANT
SYR TB 1ML 27GX1/2 LL (SYRINGE) ×2 IMPLANT
WATER STERILE IRR 1000ML POUR (IV SOLUTION) ×2 IMPLANT
WIPE NON LINTING 3.25X3.25 (MISCELLANEOUS) ×2 IMPLANT

## 2015-01-13 NOTE — Anesthesia Postprocedure Evaluation (Signed)
  Anesthesia Post-op Note  Patient: Sabrina Wilson  Procedure(s) Performed: Procedure(s) with comments: CATARACT EXTRACTION PHACO AND INTRAOCULAR LENS PLACEMENT (IOC) (Left) - [place]  1:02.9 AP   19.2 CDE  12.06 casette lot #  [id number]  Anesthesia type:MAC  Patient location: short stay  Post pain: Pain level controlled  Post assessment: Post-op Vital signs reviewed, Patient's Cardiovascular Status Stable, Respiratory Function Stable, Patent Airway and No signs of Nausea or vomiting  Post vital signs: Reviewed and stable  Last Vitals: 99% 58hr 18 resp 152/78 98.2 Filed Vitals:   01/13/15 0911  BP: 159/77  Pulse: 57  Temp: 36.8 C  Resp: 20    Level of consciousness: awake, alert  and patient cooperative  Complications: No apparent anesthesia complications

## 2015-01-13 NOTE — Discharge Instructions (Signed)
Eye Surgery Discharge Instructions  Expect mild scratchy sensation or mild soreness. DO NOT RUB YOUR EYE!  The day of surgery:  Minimal physical activity, but bed rest is not required  No reading, computer work, or close hand work  No bending, lifting, or straining.  May watch TV  For 24 hours:  No driving, legal decisions, or alcoholic beverages  Safety precautions  Eat anything you prefer: It is better to start with liquids, then soup then solid foods.  _____ Eye patch should be worn until postoperative exam tomorrow.  __x__ Solar shield eyeglasses should be worn for comfort in the sunlight/patch while sleeping  Resume all regular medications including aspirin or Coumadin if these were discontinued prior to surgery. You may shower, bathe, shave, or wash your hair. Tylenol may be taken for mild discomfort.  Call your doctor if you experience significant pain, nausea, or vomiting, fever > 101 or other signs of infection. [phone] or [phone] Specific instructions:  Follow-up Information    Follow up with Tim Lair, MD On 01/14/2015.   Specialty:  Ophthalmology   Why:  10:10   Contact information:   [address] [place] [address] [phone]

## 2015-01-13 NOTE — Transfer of Care (Signed)
Immediate Anesthesia Transfer of Care Note  Patient: Carrina Schoenberger  Procedure(s) Performed: Procedure(s) with comments: CATARACT EXTRACTION PHACO AND INTRAOCULAR LENS PLACEMENT (IOC) (Left) - [place]  1:02.9 AP   19.2 CDE  12.06 casette lot #  [id number]  Patient Location: Short Stay  Anesthesia Type:MAC  Level of Consciousness: awake, alert  and oriented  Airway & Oxygen Therapy: Patient Spontanous Breathing and Patient connected to nasal cannula oxygen  Post-op Assessment: Report given to RN and Post -op Vital signs reviewed and stable  Post vital signs: Reviewed and stable  Last Vitals: 99% 58hr 18resp 152/78 98.2 152/78 Filed Vitals:   01/13/15 0911  BP: 159/77  Pulse: 57  Temp: 36.8 C  Resp: 20    Complications: No apparent anesthesia complications

## 2015-01-13 NOTE — Op Note (Signed)
PREOPERATIVE DIAGNOSIS:  Nuclear sclerotic cataract of the left eye.   POSTOPERATIVE DIAGNOSIS:  nuclear sclerotic cataract left eye   OPERATIVE PROCEDURE:  Procedure(s): CATARACT EXTRACTION PHACO AND INTRAOCULAR LENS PLACEMENT (IOC)   SURGEON:  Birder Robson, MD.   ANESTHESIA:   Anesthesiologist: Gijsbertus Lonia Mad, MD CRNA: Delaney Meigs, CRNA  1.      Managed anesthesia care. 2.      Topical tetracaine drops followed by 2% Xylocaine jelly applied in the preoperative holding area.   COMPLICATIONS:  None.   TECHNIQUE:   Stop and chop   DESCRIPTION OF PROCEDURE:  The patient was examined and consented in the preoperative holding area where the aforementioned topical anesthesia was applied to the left eye and then brought back to the Operating Room where the left eye was prepped and draped in the usual sterile ophthalmic fashion and a lid speculum was placed. A paracentesis was created with the side port blade and the anterior chamber was filled with viscoelastic. A near clear corneal incision was performed with the steel keratome. A continuous curvilinear capsulorrhexis was performed with a cystotome followed by the capsulorrhexis forceps. Hydrodissection and hydrodelineation were carried out with BSS on a blunt cannula. The lens was removed in a stop and chop  technique and the remaining cortical material was removed with the irrigation-aspiration handpiece. The capsular bag was inflated with viscoelastic and the Technis ZCB00 lens was placed in the capsular bag without complication. The remaining viscoelastic was removed from the eye with the irrigation-aspiration handpiece. The wounds were hydrated. The anterior chamber was flushed with Miostat and the eye was inflated to physiologic pressure. 0.1 mL of cefuroxime concentration 10 mg/mL was placed in the anterior chamber. The wounds were found to be water tight. The eye was dressed with Vigamox. The patient was given protective  glasses to wear throughout the day and a shield with which to sleep tonight. The patient was also given drops with which to begin a drop regimen today and will follow-up with me in one day.  Implant Name Type Inv. Item Serial No. Manufacturer Lot No. LRB No. Used  LENS IMPL INTRAOC ZCB00 22.0 - [id number] Intraocular Lens LENS IMPL INTRAOC ZCB00 22.0 [id number] AMO   Left 1   Procedure(s) with comments: CATARACT EXTRACTION PHACO AND INTRAOCULAR LENS PLACEMENT (IOC) (Left) - [place]  1:02.9 AP   19.2 CDE  12.06 casette lot #  [id number]  Electronically signed: [place] 01/13/2015 10:37 AM

## 2015-01-13 NOTE — Anesthesia Preprocedure Evaluation (Signed)
Anesthesia Evaluation  Patient identified by MRN, date of birth, ID band Patient awake    Reviewed: Allergy & Precautions, NPO status , Patient's Chart, lab work & pertinent test results  Airway Mallampati: II       Dental no notable dental hx.    Pulmonary former smoker,    Pulmonary exam normal        Cardiovascular hypertension, Pt. on medications Normal cardiovascular exam     Neuro/Psych    GI/Hepatic negative GI ROS, Neg liver ROS,   Endo/Other  negative endocrine ROS  Renal/GU negative Renal ROS     Musculoskeletal   Abdominal Normal abdominal exam  (+)   Peds  Hematology negative hematology ROS (+)   Anesthesia Other Findings   Reproductive/Obstetrics                             Anesthesia Physical Anesthesia Plan  ASA: II  Anesthesia Plan: MAC   Post-op Pain Management:    Induction: Intravenous  Airway Management Planned: Nasal Cannula  Additional Equipment:   Intra-op Plan:   Post-operative Plan:   Informed Consent: I have reviewed the patients History and Physical, chart, labs and discussed the procedure including the risks, benefits and alternatives for the proposed anesthesia with the patient or authorized representative who has indicated his/her understanding and acceptance.     Plan Discussed with: CRNA  Anesthesia Plan Comments:         Anesthesia Quick Evaluation

## 2015-01-13 NOTE — H&P (Signed)
  All labs reviewed. Abnormal studies sent to patients PCP when indicated.  Previous H&P reviewed, patient examined, there are NO CHANGES.  PORFILIO,WILLIAM LOUIS9/20/201610:10 AM

## 2015-01-22 ENCOUNTER — Ambulatory Visit (INDEPENDENT_AMBULATORY_CARE_PROVIDER_SITE_OTHER): Payer: Medicare Other

## 2015-01-22 DIAGNOSIS — Z23 Encounter for immunization: Secondary | ICD-10-CM

## 2015-01-27 ENCOUNTER — Encounter: Payer: Self-pay | Admitting: *Deleted

## 2015-02-03 ENCOUNTER — Ambulatory Visit: Payer: Medicare Other | Admitting: Anesthesiology

## 2015-02-03 ENCOUNTER — Encounter
Admission: RE | Disposition: A | Discharge status: Home / Self Care | Payer: Self-pay | Source: Ambulatory Visit | Attending: Ophthalmology

## 2015-02-03 ENCOUNTER — Encounter: Payer: Self-pay | Admitting: Ophthalmology

## 2015-02-03 ENCOUNTER — Ambulatory Visit
Admission: RE | Admit: 2015-02-03 | Discharge: 2015-02-03 | Disposition: A | Discharge status: Home / Self Care | Payer: Medicare Other | Source: Ambulatory Visit | Attending: Ophthalmology | Admitting: Ophthalmology

## 2015-02-03 DIAGNOSIS — Z882 Allergy status to sulfonamides status: Secondary | ICD-10-CM | POA: Insufficient documentation

## 2015-02-03 DIAGNOSIS — Z79899 Other long term (current) drug therapy: Secondary | ICD-10-CM | POA: Insufficient documentation

## 2015-02-03 DIAGNOSIS — M1990 Unspecified osteoarthritis, unspecified site: Secondary | ICD-10-CM | POA: Diagnosis not present

## 2015-02-03 DIAGNOSIS — Z85038 Personal history of other malignant neoplasm of large intestine: Secondary | ICD-10-CM | POA: Diagnosis not present

## 2015-02-03 DIAGNOSIS — I10 Essential (primary) hypertension: Secondary | ICD-10-CM | POA: Diagnosis not present

## 2015-02-03 DIAGNOSIS — G629 Polyneuropathy, unspecified: Secondary | ICD-10-CM | POA: Diagnosis not present

## 2015-02-03 DIAGNOSIS — H2511 Age-related nuclear cataract, right eye: Secondary | ICD-10-CM | POA: Insufficient documentation

## 2015-02-03 HISTORY — PX: CATARACT EXTRACTION W/PHACO: SHX586

## 2015-02-03 SURGERY — PHACOEMULSIFICATION, CATARACT, WITH IOL INSERTION
Anesthesia: Monitor Anesthesia Care | Laterality: Right | Wound class: Clean

## 2015-02-03 MED ORDER — POVIDONE-IODINE 5 % OP SOLN
OPHTHALMIC | Status: AC
  Administered 2015-02-03: 1 via OPHTHALMIC
  Filled 2015-02-03: qty 30

## 2015-02-03 MED ORDER — CEFUROXIME OPHTHALMIC INJECTION 1 MG/0.1 ML
INJECTION | OPHTHALMIC | Status: DC | PRN
  Administered 2015-02-03: 1 mg via INTRACAMERAL

## 2015-02-03 MED ORDER — FENTANYL CITRATE (PF) 100 MCG/2ML IJ SOLN
INTRAMUSCULAR | Status: DC | PRN
  Administered 2015-02-03: 50 ug via INTRAVENOUS

## 2015-02-03 MED ORDER — EPINEPHRINE HCL 1 MG/ML IJ SOLN
INTRAMUSCULAR | Status: AC
  Filled 2015-02-03: qty 1

## 2015-02-03 MED ORDER — ARMC OPHTHALMIC DILATING GEL
1.0000 "application " | OPHTHALMIC | Status: AC | PRN
  Administered 2015-02-03 (×2): 1 via OPHTHALMIC

## 2015-02-03 MED ORDER — ARMC OPHTHALMIC DILATING GEL
OPHTHALMIC | Status: AC
  Administered 2015-02-03: 1 via OPHTHALMIC
  Filled 2015-02-03: qty 0.25

## 2015-02-03 MED ORDER — POVIDONE-IODINE 5 % OP SOLN
1.0000 "application " | OPHTHALMIC | Status: AC | PRN
  Administered 2015-02-03: 1 via OPHTHALMIC

## 2015-02-03 MED ORDER — CEFUROXIME OPHTHALMIC INJECTION 1 MG/0.1 ML
INJECTION | OPHTHALMIC | Status: AC
  Filled 2015-02-03: qty 0.1

## 2015-02-03 MED ORDER — MIDAZOLAM HCL 2 MG/2ML IJ SOLN
INTRAMUSCULAR | Status: DC | PRN
  Administered 2015-02-03: 1 mg via INTRAVENOUS

## 2015-02-03 MED ORDER — MOXIFLOXACIN HCL 0.5 % OP SOLN
OPHTHALMIC | Status: DC | PRN
Start: 2015-02-03 — End: 2015-02-03
  Administered 2015-02-03: 1 [drp] via OPHTHALMIC

## 2015-02-03 MED ORDER — MOXIFLOXACIN HCL 0.5 % OP SOLN
OPHTHALMIC | Status: AC
  Filled 2015-02-03: qty 3

## 2015-02-03 MED ORDER — EPINEPHRINE HCL 1 MG/ML IJ SOLN
INTRAOCULAR | Status: DC | PRN
  Administered 2015-02-03: 10:00:00 via OPHTHALMIC

## 2015-02-03 MED ORDER — MOXIFLOXACIN HCL 0.5 % OP SOLN: 1.0000 [drp] | OPHTHALMIC | Status: DC | PRN

## 2015-02-03 MED ORDER — SODIUM CHLORIDE 0.9 % IV SOLN
INTRAVENOUS | Status: DC
  Administered 2015-02-03: 09:00:00 via INTRAVENOUS

## 2015-02-03 MED ORDER — NA CHONDROIT SULF-NA HYALURON 40-17 MG/ML IO SOLN
INTRAOCULAR | Status: DC | PRN
  Administered 2015-02-03: 1 mL via INTRAOCULAR

## 2015-02-03 MED ORDER — TETRACAINE HCL 0.5 % OP SOLN
1.0000 [drp] | OPHTHALMIC | Status: AC | PRN
  Administered 2015-02-03: 1 [drp] via OPHTHALMIC

## 2015-02-03 MED ORDER — TETRACAINE HCL 0.5 % OP SOLN
OPHTHALMIC | Status: AC
  Administered 2015-02-03: 1 [drp] via OPHTHALMIC
  Filled 2015-02-03: qty 2

## 2015-02-03 SURGICAL SUPPLY — 22 items
CANNULA ANT/CHMB 27GA (MISCELLANEOUS) ×2 IMPLANT
CUP MEDICINE 2OZ PLAST GRAD ST (MISCELLANEOUS) ×2 IMPLANT
GLOVE BIO SURGEON STRL SZ8 (GLOVE) ×2 IMPLANT
GLOVE BIOGEL M 6.5 STRL (GLOVE) ×2 IMPLANT
GLOVE SURG LX 8.0 MICRO (GLOVE) ×1
GLOVE SURG LX STRL 8.0 MICRO (GLOVE) ×1 IMPLANT
GOWN STRL REUS W/ TWL LRG LVL3 (GOWN DISPOSABLE) ×2 IMPLANT
GOWN STRL REUS W/TWL LRG LVL3 (GOWN DISPOSABLE) ×2
LENS IOL TECNIS 22.0 (Intraocular Lens) ×2 IMPLANT
LENS IOL TECNIS MONO 1P 22.0 (Intraocular Lens) ×1 IMPLANT
PACK CATARACT (MISCELLANEOUS) ×2 IMPLANT
PACK CATARACT BRASINGTON LX (MISCELLANEOUS) ×2 IMPLANT
PACK EYE AFTER SURG (MISCELLANEOUS) ×2 IMPLANT
SOL BSS BAG (MISCELLANEOUS) ×2
SOL PREP PVP 2OZ (MISCELLANEOUS) ×2
SOLUTION BSS BAG (MISCELLANEOUS) ×1 IMPLANT
SOLUTION PREP PVP 2OZ (MISCELLANEOUS) ×1 IMPLANT
SYR 3ML LL SCALE MARK (SYRINGE) ×2 IMPLANT
SYR 5ML LL (SYRINGE) ×2 IMPLANT
SYR TB 1ML 27GX1/2 LL (SYRINGE) ×2 IMPLANT
WATER STERILE IRR 1000ML POUR (IV SOLUTION) ×2 IMPLANT
WIPE NON LINTING 3.25X3.25 (MISCELLANEOUS) ×2 IMPLANT

## 2015-02-03 NOTE — Anesthesia Preprocedure Evaluation (Signed)
Anesthesia Evaluation  Patient identified by MRN, date of birth, ID band  Reviewed: Allergy & Precautions, NPO status , Patient's Chart, lab work & pertinent test results  History of Anesthesia Complications Negative for: history of anesthetic complications  Airway Mallampati: II       Dental  (+) Upper Dentures   Pulmonary neg pulmonary ROS, former smoker,           Cardiovascular hypertension, Pt. on medications      Neuro/Psych negative neurological ROS     GI/Hepatic negative GI ROS, Neg liver ROS,   Endo/Other  negative endocrine ROS  Renal/GU negative Renal ROS     Musculoskeletal  (+) Arthritis , Osteoarthritis,    Abdominal   Peds  Hematology   Anesthesia Other Findings   Reproductive/Obstetrics                             Anesthesia Physical Anesthesia Plan  ASA: II  Anesthesia Plan: MAC   Post-op Pain Management:    Induction: Intravenous  Airway Management Planned: Nasal Cannula  Additional Equipment:   Intra-op Plan:   Post-operative Plan:   Informed Consent: I have reviewed the patients History and Physical, chart, labs and discussed the procedure including the risks, benefits and alternatives for the proposed anesthesia with the patient or authorized representative who has indicated his/her understanding and acceptance.     Plan Discussed with:   Anesthesia Plan Comments:         Anesthesia Quick Evaluation

## 2015-02-03 NOTE — Transfer of Care (Signed)
Immediate Anesthesia Transfer of Care Note  Patient: Sabrina Wilson  Procedure(s) Performed: Procedure(s) with comments: CATARACT EXTRACTION PHACO AND INTRAOCULAR LENS PLACEMENT (IOC) (Right) - us00:53 ap46.5 cde10.79  Patient Location: PACU and Short Stay  Anesthesia Type:MAC  Level of Consciousness: awake, alert  and oriented  Airway & Oxygen Therapy: Patient Spontanous Breathing  Post-op Assessment: Report given to RN and Post -op Vital signs reviewed and stable  Post vital signs: Reviewed and stable  Last Vitals:  Filed Vitals:   02/03/15 1018  BP: 145/82  Pulse: 61  Temp: 36.2 C  Resp: 18    Complications: No apparent anesthesia complications

## 2015-02-03 NOTE — H&P (Signed)
  All labs reviewed. Abnormal studies sent to patients PCP when indicated.  Previous H&P reviewed, patient examined, there are NO CHANGES.  Sabrina Wilson LOUIS10/11/20169:44 AM

## 2015-02-03 NOTE — Op Note (Signed)
PREOPERATIVE DIAGNOSIS:  Nuclear sclerotic cataract of the right eye.   POSTOPERATIVE DIAGNOSIS: cataract right eye   OPERATIVE PROCEDURE:  Procedure(s): CATARACT EXTRACTION PHACO AND INTRAOCULAR LENS PLACEMENT (IOC)   SURGEON:  Birder Robson, MD.   ANESTHESIA:  Anesthesiologist: Gunnar Fusi, MD CRNA: Jonna Clark, CRNA; Aline Brochure, CRNA  1.      Managed anesthesia care. 2.      Topical tetracaine drops followed by 2% Xylocaine jelly applied in the preoperative holding area.   COMPLICATIONS:  None.   TECHNIQUE:   Stop and chop   DESCRIPTION OF PROCEDURE:  The patient was examined and consented in the preoperative holding area where the aforementioned topical anesthesia was applied to the right eye and then brought back to the Operating Room where the right eye was prepped and draped in the usual sterile ophthalmic fashion and a lid speculum was placed. A paracentesis was created with the side port blade and the anterior chamber was filled with viscoelastic. A near clear corneal incision was performed with the steel keratome. A continuous curvilinear capsulorrhexis was performed with a cystotome followed by the capsulorrhexis forceps. Hydrodissection and hydrodelineation were carried out with BSS on a blunt cannula. The lens was removed in a stop and chop  technique and the remaining cortical material was removed with the irrigation-aspiration handpiece. The capsular bag was inflated with viscoelastic and the Technis ZCB00  lens was placed in the capsular bag without complication. The remaining viscoelastic was removed from the eye with the irrigation-aspiration handpiece. The wounds were hydrated. The anterior chamber was flushed with Miostat and the eye was inflated to physiologic pressure. 0.1 mL of cefuroxime concentration 10 mg/mL was placed in the anterior chamber. The wounds were found to be water tight. The eye was dressed with Vigamox. The patient was given protective  glasses to wear throughout the day and a shield with which to sleep tonight. The patient was also given drops with which to begin a drop regimen today and will follow-up with me in one day.  Implant Name Type Inv. Item Serial No. Manufacturer Lot No. LRB No. Used  LENS IMPL INTRAOC ZCB00 22.0 - [id number] Intraocular Lens LENS IMPL INTRAOC ZCB00 22.0 [id number] AMO   Right 1   Procedure(s) with comments: CATARACT EXTRACTION PHACO AND INTRAOCULAR LENS PLACEMENT (IOC) (Right) - us00:53 ap46.5 cde10.79  Electronically signed: [place] 02/03/2015 10:15 AM

## 2015-02-03 NOTE — Anesthesia Postprocedure Evaluation (Signed)
  Anesthesia Post-op Note  Patient: Sabrina Wilson  Procedure(s) Performed: Procedure(s) with comments: CATARACT EXTRACTION PHACO AND INTRAOCULAR LENS PLACEMENT (IOC) (Right) - us00:53 ap46.5 cde10.79  Anesthesia type:MAC  Patient location: PACU  Post pain: Pain level controlled  Post assessment: Post-op Vital signs reviewed, Patient's Cardiovascular Status Stable, Respiratory Function Stable, Patent Airway and No signs of Nausea or vomiting  Post vital signs: Reviewed and stable  Last Vitals:  Filed Vitals:   02/03/15 1018  BP: 145/82  Pulse: 61  Temp: 36.2 C  Resp: 18    Level of consciousness: awake, alert  and patient cooperative  Complications: No apparent anesthesia complications

## 2015-02-03 NOTE — Discharge Instructions (Signed)
Eye Surgery Discharge Instructions  Expect mild scratchy sensation or mild soreness. DO NOT RUB YOUR EYE!  The day of surgery:  Minimal physical activity, but bed rest is not required  No reading, computer work, or close hand work  No bending, lifting, or straining.  May watch TV  For 24 hours:  No driving, legal decisions, or alcoholic beverages  Safety precautions  Eat anything you prefer: It is better to start with liquids, then soup then solid foods.  _____ Eye patch should be worn until postoperative exam tomorrow.  ____ Solar shield eyeglasses should be worn for comfort in the sunlight/patch while sleeping  Resume all regular medications including aspirin or Coumadin if these were discontinued prior to surgery. You may shower, bathe, shave, or wash your hair. Tylenol may be taken for mild discomfort.  Call your doctor if you experience significant pain, nausea, or vomiting, fever > 101 or other signs of infection. [phone] or [phone] Specific instructions:  Follow-up Information    Follow up with Tim Lair, MD.   Specialty:  Ophthalmology   Why:  10:50   Contact information:   [address] [place] [address] [phone]

## 2015-03-10 ENCOUNTER — Telehealth: Payer: Self-pay | Admitting: Internal Medicine

## 2015-03-10 DIAGNOSIS — R928 Other abnormal and inconclusive findings on diagnostic imaging of breast: Secondary | ICD-10-CM

## 2015-03-10 NOTE — Telephone Encounter (Signed)
Pt called and needs her mammogram. Per Roselyn Reef @ [facility] she will need a diagnostic b/l and b/l ultrasound. Please let me know once order has been entered so I can get this scheduled for her. Thanks! MT

## 2015-03-10 NOTE — Telephone Encounter (Signed)
Orders placed.

## 2015-04-01 ENCOUNTER — Other Ambulatory Visit: Payer: Self-pay | Admitting: Internal Medicine

## 2015-04-01 ENCOUNTER — Telehealth: Payer: Self-pay | Admitting: *Deleted

## 2015-04-01 ENCOUNTER — Ambulatory Visit
Admission: RE | Admit: 2015-04-01 | Discharge: 2015-04-01 | Disposition: A | Discharge status: Home / Self Care | Payer: Medicare Other | Source: Ambulatory Visit | Attending: Internal Medicine | Admitting: Internal Medicine

## 2015-04-01 DIAGNOSIS — R928 Other abnormal and inconclusive findings on diagnostic imaging of breast: Secondary | ICD-10-CM

## 2015-04-01 NOTE — Telephone Encounter (Signed)
Patient was told to let you know that she had her mammogram done today (04/01/15) at [facility]

## 2015-04-02 NOTE — Telephone Encounter (Signed)
Films reviewed. No residual distortion at cyst site. Biopsy showed fibrocystic changes.  Patient will continue annual screening exams with her PCP.

## 2015-05-23 ENCOUNTER — Other Ambulatory Visit: Payer: Self-pay | Admitting: Internal Medicine

## 2015-06-17 ENCOUNTER — Inpatient Hospital Stay (HOSPITAL_BASED_OUTPATIENT_CLINIC_OR_DEPARTMENT_OTHER): Payer: Medicare Other | Admitting: Internal Medicine

## 2015-06-17 ENCOUNTER — Inpatient Hospital Stay: Payer: Medicare Other | Attending: Internal Medicine

## 2015-06-17 ENCOUNTER — Other Ambulatory Visit: Payer: Self-pay | Admitting: *Deleted

## 2015-06-17 VITALS — BP 140/76 | HR 76 | Temp 98.4°F | Resp 18 | Ht 66.0 in | Wt 194.0 lb

## 2015-06-17 DIAGNOSIS — Z9221 Personal history of antineoplastic chemotherapy: Secondary | ICD-10-CM | POA: Insufficient documentation

## 2015-06-17 DIAGNOSIS — Z79899 Other long term (current) drug therapy: Secondary | ICD-10-CM

## 2015-06-17 DIAGNOSIS — Z85038 Personal history of other malignant neoplasm of large intestine: Secondary | ICD-10-CM

## 2015-06-17 DIAGNOSIS — I10 Essential (primary) hypertension: Secondary | ICD-10-CM | POA: Insufficient documentation

## 2015-06-17 DIAGNOSIS — M1990 Unspecified osteoarthritis, unspecified site: Secondary | ICD-10-CM | POA: Diagnosis not present

## 2015-06-17 DIAGNOSIS — Z87891 Personal history of nicotine dependence: Secondary | ICD-10-CM | POA: Insufficient documentation

## 2015-06-17 DIAGNOSIS — C189 Malignant neoplasm of colon, unspecified: Secondary | ICD-10-CM

## 2015-06-17 LAB — COMPREHENSIVE METABOLIC PANEL
ALBUMIN: 4.2 g/dL (ref 3.5–5.0)
ALK PHOS: 61 U/L (ref 38–126)
ALT: 14 U/L (ref 14–54)
AST: 17 U/L (ref 15–41)
Anion gap: 6 (ref 5–15)
BILIRUBIN TOTAL: 0.5 mg/dL (ref 0.3–1.2)
BUN: 23 mg/dL — AB (ref 6–20)
CO2: 27 mmol/L (ref 22–32)
CREATININE: 0.89 mg/dL (ref 0.44–1.00)
Calcium: 9.5 mg/dL (ref 8.9–10.3)
Chloride: 102 mmol/L (ref 101–111)
GFR calc Af Amer: >60 mL/min (ref >60)
GLUCOSE: 181 mg/dL — AB (ref 65–99)
POTASSIUM: 3.7 mmol/L (ref 3.5–5.1)
Sodium: 135 mmol/L (ref 135–145)
TOTAL PROTEIN: 7.3 g/dL (ref 6.5–8.1)

## 2015-06-17 LAB — CBC WITH DIFFERENTIAL/PLATELET
Basophils Absolute: 0.1 10*3/uL (ref 0–0.1)
Basophils Relative: 1 %
EOS ABS: 0.2 10*3/uL (ref 0–0.7)
Eosinophils Relative: 3 %
HCT: 38.5 % (ref 35.0–47.0)
HEMOGLOBIN: 13 g/dL (ref 12.0–16.0)
LYMPHS ABS: 1.4 10*3/uL (ref 1.0–3.6)
LYMPHS PCT: 21 %
MCH: 32.1 pg (ref 26.0–34.0)
MCHC: 33.8 g/dL (ref 32.0–36.0)
MCV: 95.2 fL (ref 80.0–100.0)
MONOS PCT: 7 %
Monocytes Absolute: 0.5 10*3/uL (ref 0.2–0.9)
NEUTROS PCT: 68 %
Neutro Abs: 4.7 10*3/uL (ref 1.4–6.5)
Platelets: 276 10*3/uL (ref 150–440)
RBC: 4.05 MIL/uL (ref 3.80–5.20)
RDW: 13.2 % (ref 11.5–14.5)
WBC: 6.9 10*3/uL (ref 3.6–11.0)

## 2015-06-17 NOTE — Progress Notes (Signed)
[place] @ [facility] [phone]  Fax:([phone]) [place] [facility]: May 16, 1940  MR#: [id number]  VVO#:[id number]  Patient Care Team: Jackolyn Confer, MD as PCP - General (Internal Medicine) Robert Bellow, MD as Consulting Physician (General Surgery) Dallas Schimke, MD (Internal Medicine)  CHIEF COMPLAINT: No chief complaint on file. Sigmoid colon cancer stage II (T3 N0 4 lymph nodes sampled M0 stage II high risk there was obstruction and perforation abscess), workup included a low CEA preoperatively and a chest x-ray and CT scan abdomen pelvis negative for metastatic disease, S/P colostomy, LATER REVERSED , K RAS  wild type B RAF not evaluated           Also initial iron deficiency with anemia thrombocytosis, high platelets considered reactive, workup includes a normal PCR for CML and a negative [id number] mutation  Adjuvant chemotherapy   Treatment Plan FOLFOX day one of cycle one August 12,2009, COMPLETED 12 CYCLES 05/19/08     No history exists.    No flowsheet data found.  HISTORY OF PRESENT ILLNESS:   Sabrina Wilson returns to our clinic for a follow-up visit. She has done well since her previous visit without any significant changes since her state of health. She continues to have persistent neuropathy in the form of numbness of fingertips bilaterally and numbness and tingling sensation in both feet. She, however, is able to perform all activities of daily living, she even continues to play piano. Specifically, she denies abdominal pain, nausea, vomiting, diarrhea, constipation, change in color of stool, bleeding from any source.  REVIEW OF SYSTEMS:   ROS   PAST MEDICAL HISTORY: Past Medical History  Diagnosis Date  . Polyp at cervical os     s/p resection Dr. Sabra Heck  . Hernia of flank   . Hypertension   . Neuropathy ([place])   . Arthritis   . Colon cancer [facility]) 2009    T3,N0; s/p resection  Dr. Hulda Humphrey and chemotherapy by Dr. Cynda Acres    PAST  SURGICAL HISTORY: Past Surgical History  Procedure Laterality Date  . Colectomy    . Hernia repair  07/13/12    ventral   . Dilation and curettage of uterus  2014    Dr. Sabra Heck, benign per pt  . Breast surgery Right February 2016    Vacuum assisted biopsy for recurrent cyst, fibrocystic changes without evidence of malignancy.  . Tonsillectomy    . Colostomy reversal    . Cataract extraction w/phaco Left 01/13/2015    Procedure: CATARACT EXTRACTION PHACO AND INTRAOCULAR LENS PLACEMENT (IOC);  Surgeon: Birder Robson, MD;  Location: [facility] ORS;  Service: Ophthalmology;  Laterality: Left;  [place]  1:02.9 AP   19.2 CDE  12.06 casette lot #  [id number]  . Cataract extraction w/phaco Right 02/03/2015    Procedure: CATARACT EXTRACTION PHACO AND INTRAOCULAR LENS PLACEMENT (IOC);  Surgeon: Birder Robson, MD;  Location: [facility] ORS;  Service: Ophthalmology;  Laterality: Right;  us00:53 ap46.5 cde10.79  . Breast biopsy Right 05/2014    Dr. Dwyane Luo office-benign    FAMILY HISTORY Family History  Problem Relation Age of Onset  . Stroke Mother   . Diabetes Mother   . Cancer Father     colon  . Cancer Brother     colon  . Cancer Brother     brain    ADVANCED DIRECTIVES:  No flowsheet data found.  HEALTH MAINTENANCE: Social History  Substance Use Topics  . Smoking status: Former Smoker  Quit date: 10/12/2007  . Smokeless tobacco: Never Used  . Alcohol Use: 0.6 oz/week    1 Standard drinks or equivalent per week     Comment: with dinner     Allergies  Allergen Reactions  . Sulfa Antibiotics Hives    Current Outpatient Prescriptions  Medication Sig Dispense Refill  . losartan (COZAAR) 100 MG tablet TAKE 1 TABLET (100 MG TOTAL) BY MOUTH DAILY. 90 tablet 1  . Omega-3 Fatty Acids (FISH OIL PO) Take by mouth.    . VITAMIN D, ERGOCALCIFEROL, PO 2 tablets daily.       No current facility-administered medications for this visit.    OBJECTIVE:  Filed Vitals:   06/17/15 1514    BP: 140/76  Pulse: 76  Temp: 98.4 F (36.9 C)  Resp: 18     Body mass index is 31.33 kg/(m^2).    ECOG FS:0 - Asymptomatic  Physical Exam BP 140/76 mmHg  Pulse 76  Temp(Src) 98.4 F (36.9 C) (Tympanic)  Resp 18  Ht 5' 6" (1.676 m)  Wt 194 lb 0.1 oz (88 kg)  BMI 31.33 kg/m2  General Appearance:    Alert, cooperative, no distress, appears younger than stated age Caucasian female  Head:    Normocephalic, without obvious abnormality, atraumatic  Eyes:    PERRL, conjunctiva/corneas clear, EOM's intact, fundi    benign, both eyes  Ears:    Normal TM's and external ear canals, both ears  Nose:   Nares normal, septum midline, mucosa normal, no drainage    or sinus tenderness  Throat:   Lips, mucosa, and tongue normal; teeth and gums normal  Neck:   Supple, symmetrical, trachea midline, no adenopathy;    thyroid:  no enlargement/tenderness/nodules; no carotid   bruit or JVD  Back:     Symmetric, no curvature, ROM normal, no CVA tenderness  Lungs:     Clear to auscultation bilaterally, respirations unlabored  Chest Wall:    No tenderness or deformity   Heart:    Regular rate and rhythm, S1 and S2 normal, no murmur, rub   or gallop  Breast Exam:    No tenderness, masses, or nipple abnormality  Abdomen:     Soft, non-tender, bowel sounds active all four quadrants,    no masses, no organomegaly. postsurgical scar  Extremities:   Extremities normal, atraumatic, no cyanosis or edema  Pulses:   2+ and symmetric all extremities  Skin:   Skin color, texture, turgor normal, no rashes or lesions  Lymph nodes:   Cervical, supraclavicular, and axillary nodes normal  Neurologic:   CNII-XII intact, normal strength, sensation and reflexes    throughout    LAB RESULTS:  CBC Latest Ref Rng 06/17/2015 03/31/2014  WBC 3.6 - 11.0 K/uL 6.9 6.2  Hemoglobin 12.0 - 16.0 g/dL 13.0 12.1  Hematocrit 35.0 - 47.0 % 38.5 37.4  Platelets 150 - 440 K/uL 276 259.0    Appointment on 06/17/2015  Component  Date Value Ref Range Status  . WBC 06/17/2015 6.9  3.6 - 11.0 K/uL Final  . RBC 06/17/2015 4.05  3.80 - 5.20 MIL/uL Final  . Hemoglobin 06/17/2015 13.0  12.0 - 16.0 g/dL Final  . HCT 06/17/2015 38.5  35.0 - 47.0 % Final  . MCV 06/17/2015 95.2  80.0 - 100.0 fL Final  . MCH 06/17/2015 32.1  26.0 - 34.0 pg Final  . MCHC 06/17/2015 33.8  32.0 - 36.0 g/dL Final  . RDW 06/17/2015 13.2  11.5 - 14.5 %   Final  . Platelets 06/17/2015 276  150 - 440 K/uL Final  . Neutrophils Relative % 06/17/2015 68   Final  . Neutro Abs 06/17/2015 4.7  1.4 - 6.5 K/uL Final  . Lymphocytes Relative 06/17/2015 21   Final  . Lymphs Abs 06/17/2015 1.4  1.0 - 3.6 K/uL Final  . Monocytes Relative 06/17/2015 7   Final  . Monocytes Absolute 06/17/2015 0.5  0.2 - 0.9 K/uL Final  . Eosinophils Relative 06/17/2015 3   Final  . Eosinophils Absolute 06/17/2015 0.2  0 - 0.7 K/uL Final  . Basophils Relative 06/17/2015 1   Final  . Basophils Absolute 06/17/2015 0.1  0 - 0.1 K/uL Final  . Sodium 06/17/2015 135  135 - 145 mmol/L Final  . Potassium 06/17/2015 3.7  3.5 - 5.1 mmol/L Final  . Chloride 06/17/2015 102  101 - 111 mmol/L Final  . CO2 06/17/2015 27  22 - 32 mmol/L Final  . Glucose, Bld 06/17/2015 181* 65 - 99 mg/dL Final  . BUN 06/17/2015 23* 6 - 20 mg/dL Final  . Creatinine, Ser 06/17/2015 0.89  0.44 - 1.00 mg/dL Final  . Calcium 06/17/2015 9.5  8.9 - 10.3 mg/dL Final  . Total Protein 06/17/2015 7.3  6.5 - 8.1 g/dL Final  . Albumin 06/17/2015 4.2  3.5 - 5.0 g/dL Final  . AST 06/17/2015 17  15 - 41 U/L Final  . ALT 06/17/2015 14  14 - 54 U/L Final  . Alkaline Phosphatase 06/17/2015 61  38 - 126 U/L Final  . Total Bilirubin 06/17/2015 0.5  0.3 - 1.2 mg/dL Final  . GFR calc non Af Amer 06/17/2015 >60  >60 mL/min Final  . GFR calc Af Amer 06/17/2015 >60  >60 mL/min Final   Comment: (NOTE) The eGFR has been calculated using the CKD EPI equation. This calculation has not been validated in all clinical situations. eGFR's  persistently <60 mL/min signify possible Chronic Kidney Disease.   . Anion gap 06/17/2015 6  5 - 15 Final    STUDIES: No results found.  ASSESSMENT and MEDICAL DECISION MAKING:  stage II sigmoid cancer, diagnosed in 2009-patient is in complete remission and no evidence of disease. She will have a colonoscopy in 2018. Clinically she continues to do remarkably well, she would like to continue a follow-up with oncology once a year, which we will arrange. She will have a mammogram in December 2017, her most recent mammogram is normal (performed in December 2016).   Patient expressed understanding and was in agreement with this plan. She also understands that She can call clinic at any time with any questions, concerns, or complaints.    No matching staging information was found for the patient.  Sabrina Hires, MD   06/17/2015 2:37 PM

## 2015-06-17 NOTE — Progress Notes (Signed)
Pt doing great. Bowels ae working well, eating and drinking good. No abd. Pain. She is 7 years out from her colon cancer dx.  She does have numbness and tingling on fingers and toes and it has never resolved but not any worse.

## 2015-06-18 LAB — CEA: CEA: 1.1 ng/mL (ref 0.0–4.7)

## 2015-08-27 ENCOUNTER — Other Ambulatory Visit: Payer: Self-pay | Admitting: Internal Medicine

## 2015-09-02 DIAGNOSIS — H18603 Keratoconus, unspecified, bilateral: Secondary | ICD-10-CM | POA: Diagnosis not present

## 2015-11-16 ENCOUNTER — Telehealth: Payer: Self-pay | Admitting: *Deleted

## 2015-11-16 ENCOUNTER — Encounter: Payer: Self-pay | Admitting: *Deleted

## 2015-11-16 MED ORDER — LOSARTAN POTASSIUM 100 MG PO TABS: ORAL_TABLET | ORAL | 1 refills | Status: DC

## 2015-11-16 NOTE — Telephone Encounter (Signed)
Patient has changed her pharmacy to [facility] in [place]   Pt will need a medication refill losartan  Pharmacy [facility]

## 2015-11-16 NOTE — Telephone Encounter (Signed)
Refilled. thanks

## 2016-02-01 ENCOUNTER — Ambulatory Visit (INDEPENDENT_AMBULATORY_CARE_PROVIDER_SITE_OTHER): Payer: Medicare Other

## 2016-02-01 DIAGNOSIS — Z23 Encounter for immunization: Secondary | ICD-10-CM | POA: Diagnosis not present

## 2016-02-22 ENCOUNTER — Other Ambulatory Visit: Payer: Self-pay | Admitting: Family Medicine

## 2016-02-22 ENCOUNTER — Telehealth: Payer: Self-pay | Admitting: *Deleted

## 2016-02-22 DIAGNOSIS — Z1239 Encounter for other screening for malignant neoplasm of breast: Secondary | ICD-10-CM

## 2016-02-22 NOTE — Telephone Encounter (Signed)
Order placed

## 2016-02-22 NOTE — Telephone Encounter (Signed)
Pt has requested to have a order for her mammogram, she would like to have this completed before her visit with Dr. Caryl Bis in Dec.  Pt contact [phone]

## 2016-02-22 NOTE — Telephone Encounter (Signed)
Can we order this?

## 2016-02-23 NOTE — Telephone Encounter (Signed)
Pt aware.

## 2016-03-09 ENCOUNTER — Encounter: Payer: Self-pay | Admitting: Family Medicine

## 2016-04-06 ENCOUNTER — Encounter: Payer: Self-pay | Admitting: Family Medicine

## 2016-04-06 ENCOUNTER — Ambulatory Visit (INDEPENDENT_AMBULATORY_CARE_PROVIDER_SITE_OTHER): Payer: Medicare Other | Admitting: Family Medicine

## 2016-04-06 VITALS — BP 140/84 | HR 93 | Temp 98.6°F | Ht 66.0 in | Wt 190.8 lb

## 2016-04-06 DIAGNOSIS — Z13220 Encounter for screening for lipoid disorders: Secondary | ICD-10-CM | POA: Diagnosis not present

## 2016-04-06 DIAGNOSIS — D229 Melanocytic nevi, unspecified: Secondary | ICD-10-CM | POA: Diagnosis not present

## 2016-04-06 DIAGNOSIS — M7711 Lateral epicondylitis, right elbow: Secondary | ICD-10-CM | POA: Diagnosis not present

## 2016-04-06 DIAGNOSIS — Z1329 Encounter for screening for other suspected endocrine disorder: Secondary | ICD-10-CM | POA: Diagnosis not present

## 2016-04-06 DIAGNOSIS — J309 Allergic rhinitis, unspecified: Secondary | ICD-10-CM | POA: Diagnosis not present

## 2016-04-06 DIAGNOSIS — G629 Polyneuropathy, unspecified: Secondary | ICD-10-CM

## 2016-04-06 DIAGNOSIS — N644 Mastodynia: Secondary | ICD-10-CM | POA: Diagnosis not present

## 2016-04-06 DIAGNOSIS — Z130 Encounter for screening for diseases of the blood and blood-forming organs and certain disorders involving the immune mechanism: Secondary | ICD-10-CM | POA: Diagnosis not present

## 2016-04-06 DIAGNOSIS — Z0001 Encounter for general adult medical examination with abnormal findings: Secondary | ICD-10-CM | POA: Diagnosis not present

## 2016-04-06 DIAGNOSIS — N898 Other specified noninflammatory disorders of vagina: Secondary | ICD-10-CM

## 2016-04-06 MED ORDER — FLUTICASONE PROPIONATE 50 MCG/ACT NA SUSP: 2.0000 | Freq: Every day | NASAL | 6 refills | Status: DC

## 2016-04-06 NOTE — Assessment & Plan Note (Signed)
Started after patient's chemotherapy. Has not progressed. She'll continue to monitor. Was previously on gabapentin though had no benefit from this.

## 2016-04-06 NOTE — Progress Notes (Signed)
Tommi Rumps, MD Phone: [phone]  Sabrina Wilson is a 75 y.o. female who presents today for physical exam.  Diet is described as regular. Not many sweets. She eats vegetables, salads, and meats. No sodas. Exercises by walking and doing housework. Previously saw a gynecologist for polyps. Notes she had a D&C as a result of the polyps that did not reveal any cancerous process. Has her mammogram scheduled for next week. Colonoscopy is due next year Up-to-date on vaccinations. Former smoker who quit 8 years ago. Drinks 1 alcoholic beverage a day. No illicit drug use.  Patient notes several new moles on her face. They're just there. Have grown in size.  Patient notes persistent sinus drainage and runny nose over the last 4 months. Feels like there is mucus in her throat. Some minimal congestion at times. No fevers. Has tried Claritin though this dries her out. Some cough with this over the last several days. No shortness of breath.  Patient notes chronic neuropathy related to her prior chemotherapy. Her fingertips and feet have some tingling and burning and mild numbness.  Patient notes right-sided tennis elbow for the last 3-4 weeks. Notes it hurts to lift at the lateral epicondyles on the right side. Has bought a brace though this has not helped very much. She is not wearing the brace consistently.   Patient does note clear normal vaginal discharge for her. No itching or dysuria or vaginal bleeding. Has not changed in a long time.  Active Ambulatory Problems    Diagnosis Date Noted  . Hypertension 12/18/2010  . Personal history of colon cancer 11/11/2011  . Ventral hernia 07/26/2012  . Medicare annual wellness visit, subsequent 03/25/2013  . Postmenopausal estrogen deficiency 03/25/2013  . Lipoma of neck 09/27/2013  . Breast cyst 04/14/2014  . Viral URI 09/30/2014  . Encounter for general adult medical examination with abnormal findings 04/06/2016  . Multiple nevi  04/06/2016  . Breast tenderness in female 04/06/2016  . Tennis elbow 04/06/2016  . Allergic rhinitis 04/06/2016  . Clear vaginal discharge 04/06/2016  . Neuropathy ([place]) 04/06/2016   Resolved Ambulatory Problems    Diagnosis Date Noted  . Medicare annual wellness visit, initial 04/12/2012  . Abscess, trunk 04/12/2012   Past Medical History:  Diagnosis Date  . Arthritis   . Colon cancer ([place]) 2009  . Hernia of flank   . Hypertension   . Neuropathy ([place])   . Polyp at cervical os     Family History  Problem Relation Age of Onset  . Stroke Mother   . Diabetes Mother   . Cancer Father     colon  . Cancer Brother     colon  . Cancer Brother     brain    Social History   Social History  . Marital status: Married    Spouse name: N/A  . Number of children: N/A  . Years of education: N/A   Occupational History  . Not on file.   Social History Main Topics  . Smoking status: Former Smoker    Quit date: 10/12/2007  . Smokeless tobacco: Never Used  . Alcohol use 0.6 oz/week    1 Standard drinks or equivalent per week     Comment: with dinner  . Drug use: No  . Sexual activity: Not on file   Other Topics Concern  . Not on file   Social History Narrative   Lives in [place] with husband. 2 children, son lives nearby.  Diet - regular      Exercise - none    ROS  General:  Negative for nexplained weight loss, fever Skin: Positive for new or changing mole, negative for sore that won't heal HEENT: Negative for trouble hearing, trouble seeing, ringing in ears, mouth sores, hoarseness, change in voice, dysphagia. CV:  Negative for chest pain, dyspnea, edema, palpitations Resp: Positive for cough, negative for dyspnea, hemoptysis GI: Negative for nausea, vomiting, diarrhea, constipation, abdominal pain, melena, hematochezia. GU: Positive for discharge, sexual difficulty Negative for dysuria, incontinence, urinary hesitance, hematuria, polyuria, lumps in  testicle or breasts MSK: Negative for muscle cramps or aches, joint pain or swelling Neuro: Positive for numbness, Negative for headaches, weakness, dizziness, passing out/fainting Psych: Negative for depression, anxiety, memory problems  Objective  Physical Exam Vitals:   04/06/16 1328  BP: 140/84  Pulse: 93  Temp: 98.6 F (37 C)    BP Readings from Last 3 Encounters:  04/06/16 140/84  06/17/15 140/76  02/03/15 (!) 176/82   Wt Readings from Last 3 Encounters:  04/06/16 190 lb 12.8 oz (86.5 kg)  06/17/15 194 lb 0.1 oz (88 kg)  01/13/15 180 lb (81.6 kg)    Physical Exam  Constitutional: No distress.  HENT:  Head: Normocephalic and atraumatic.  Mouth/Throat: Oropharynx is clear and moist. No oropharyngeal exudate.  Eyes: Conjunctivae are normal. Pupils are equal, round, and reactive to light.  Cardiovascular: Normal rate, regular rhythm and normal heart sounds.   Pulmonary/Chest: Effort normal and breath sounds normal.  Abdominal: Soft. Bowel sounds are normal. She exhibits no distension. There is no tenderness. There is no rebound and no guarding.  Genitourinary:  Genitourinary Comments: Right breast with mild tenderness at the 4:00 position, no masses palpated in this area, no other tenderness in bilateral breasts, no masses in bilateral breasts, no skin changes in bilateral breasts, no axillary masses bilaterally, normal labia, normal vaginal mucosa, minimal amount of clear vaginal discharge, normal cervix, normal bimanual exam  Musculoskeletal: She exhibits no edema.  Right lateral epicondyles with tenderness, discomfort on resisted pronation and supination, left elbow with no tenderness or discomfort, 5 out of 5 grip strength bilaterally  Neurological: She is alert. Gait normal.  Skin: Skin is warm and dry. She is not diaphoretic.  Psychiatric: Mood and affect normal.     Assessment/Plan:   Encounter for general adult medical examination with abnormal  findings Advised on diet and exercise. Patient will return for fasting lab work. Vaccinations up-to-date. We will obtain a diagnostic mammogram given her tenderness over right breast. She'll keep her colonoscopy as scheduled next year.  Breast tenderness in female No palpable abnormalities in this area. Obtain diagnostic mammogram and ultrasounds.  Tennis elbow History and exam most consistent with tennis elbow. Advised on brace. Can also ice the area. If not improving could consider referral to sports medicine.  Multiple nevi Appear benign. Referred to dermatology for evaluation as patient wants these removed.  Allergic rhinitis Suspect upper respiratory symptoms are likely related to allergies given duration and symptoms. Discussed Flonase daily. She'll monitor and if not improving she'll let us know.  Clear vaginal discharge Patient notes this is typical for her. Scant clear discharge noted on exam. Asymptomatic. Offered testing for yeast and BV though this was deferred today. If patient develops any symptoms she will let us know.  Neuropathy ([place]) Started after patient's chemotherapy. Has not progressed. She'll continue to monitor. Was previously on gabapentin though had no benefit from this.   Orders  Placed This Encounter  Procedures  . MM Digital Diagnostic Bilat    Standing Status:   Future    Standing Expiration Date:   06/07/2017    Order Specific Question:   Reason for Exam (SYMPTOM  OR DIAGNOSIS REQUIRED)    Answer:   right breast tenderness on exam at 4 o'clock, due for screening mammogram    Order Specific Question:   Preferred imaging location?    Answer:   [facility]  . US BREAST COMPLETE UNI LEFT INC AXILLA    Standing Status:   Future    Standing Expiration Date:   06/07/2017    Order Specific Question:   Reason for Exam (SYMPTOM  OR DIAGNOSIS REQUIRED)    Answer:   right breast tenderness on exam at 4 o'clock, due for screening mammogram    Order Specific  Question:   Preferred imaging location?    Answer:   [facility]  . US BREAST COMPLETE UNI RIGHT INC AXILLA    Standing Status:   Future    Standing Expiration Date:   06/07/2017    Order Specific Question:   Reason for Exam (SYMPTOM  OR DIAGNOSIS REQUIRED)    Answer:   right breast tenderness on exam at 4 o'clock, due for screening mammogram    Order Specific Question:   Preferred imaging location?    Answer:   [facility]  . Comp Met (CMET)    Standing Status:   Future    Standing Expiration Date:   04/06/2017  . CBC    Standing Status:   Future    Standing Expiration Date:   04/06/2017  . TSH    Standing Status:   Future    Standing Expiration Date:   04/06/2017  . HgB A1c    Standing Status:   Future    Standing Expiration Date:   04/06/2017  . Lipid Profile    Standing Status:   Future    Standing Expiration Date:   04/06/2017  . Ambulatory referral to Dermatology    Referral Priority:   Routine    Referral Type:   Consultation    Referral Reason:   Specialty Services Required    Requested Specialty:   Dermatology    Number of Visits Requested:   1    Meds ordered this encounter  Medications  . fluticasone (FLONASE) 50 MCG/ACT nasal spray    Sig: Place 2 sprays into both nostrils daily.    Dispense:  16 g    Refill:  [place], MD [place]

## 2016-04-06 NOTE — Assessment & Plan Note (Signed)
Patient notes this is typical for her. Scant clear discharge noted on exam. Asymptomatic. Offered testing for yeast and BV though this was deferred today. If patient develops any symptoms she will let us know.

## 2016-04-06 NOTE — Assessment & Plan Note (Addendum)
Suspect upper respiratory symptoms are likely related to allergies given duration and symptoms. Discussed Flonase daily. She'll monitor and if not improving she'll let us know.

## 2016-04-06 NOTE — Assessment & Plan Note (Signed)
Advised on diet and exercise. Patient will return for fasting lab work. Vaccinations up-to-date. We will obtain a diagnostic mammogram given her tenderness over right breast. She'll keep her colonoscopy as scheduled next year.

## 2016-04-06 NOTE — Assessment & Plan Note (Signed)
History and exam most consistent with tennis elbow. Advised on brace. Can also ice the area. If not improving could consider referral to sports medicine.

## 2016-04-06 NOTE — Patient Instructions (Signed)
Nice to meet you. We will get a diagnostic mammogram given your breast tenderness. We will refer you to dermatology for evaluation of your moles. Please start on Flonase for your drainage. Please monitor your discharge and if it changes from normal or you develop any symptoms with this please let us know. You should continue the tennis elbow brace and wear this as much as possible. You can also ice the area for 10 minutes 2-3 times a day. If this does not improve please let us know.

## 2016-04-06 NOTE — Assessment & Plan Note (Signed)
Appear benign. Referred to dermatology for evaluation as patient wants these removed.

## 2016-04-06 NOTE — Progress Notes (Signed)
Pre visit review using our clinic review tool, if applicable. No additional management support is needed unless otherwise documented below in the visit note. 

## 2016-04-06 NOTE — Assessment & Plan Note (Signed)
No palpable abnormalities in this area. Obtain diagnostic mammogram and ultrasounds.

## 2016-04-07 NOTE — Progress Notes (Signed)
Orders changed for patient in epic

## 2016-04-12 ENCOUNTER — Encounter: Payer: Self-pay | Admitting: Family Medicine

## 2016-04-12 ENCOUNTER — Other Ambulatory Visit: Payer: Self-pay | Admitting: Family Medicine

## 2016-04-12 ENCOUNTER — Ambulatory Visit
Admission: RE | Admit: 2016-04-12 | Discharge: 2016-04-12 | Disposition: A | Discharge status: Home / Self Care | Payer: Medicare Other | Source: Ambulatory Visit | Attending: Family Medicine | Admitting: Family Medicine

## 2016-04-12 DIAGNOSIS — N644 Mastodynia: Secondary | ICD-10-CM

## 2016-04-12 DIAGNOSIS — Z1231 Encounter for screening mammogram for malignant neoplasm of breast: Secondary | ICD-10-CM | POA: Insufficient documentation

## 2016-04-12 DIAGNOSIS — Z1239 Encounter for other screening for malignant neoplasm of breast: Secondary | ICD-10-CM

## 2016-04-19 ENCOUNTER — Ambulatory Visit: Payer: Medicare Other

## 2016-04-28 ENCOUNTER — Other Ambulatory Visit (INDEPENDENT_AMBULATORY_CARE_PROVIDER_SITE_OTHER): Payer: Medicare Other

## 2016-04-28 DIAGNOSIS — Z1329 Encounter for screening for other suspected endocrine disorder: Secondary | ICD-10-CM

## 2016-04-28 DIAGNOSIS — Z13220 Encounter for screening for lipoid disorders: Secondary | ICD-10-CM

## 2016-04-28 DIAGNOSIS — Z130 Encounter for screening for diseases of the blood and blood-forming organs and certain disorders involving the immune mechanism: Secondary | ICD-10-CM | POA: Diagnosis not present

## 2016-04-28 LAB — CBC
HEMATOCRIT: 37.9 % (ref 36.0–46.0)
HEMOGLOBIN: 12.6 g/dL (ref 12.0–15.0)
MCHC: 33.4 g/dL (ref 30.0–36.0)
MCV: 95.4 fl (ref 78.0–100.0)
PLATELETS: 286 10*3/uL (ref 150.0–400.0)
RBC: 3.97 Mil/uL (ref 3.87–5.11)
RDW: 14.1 % (ref 11.5–15.5)
WBC: 6.4 10*3/uL (ref 4.0–10.5)

## 2016-04-28 LAB — LIPID PANEL
CHOL/HDL RATIO: 2
Cholesterol: 253 mg/dL — ABNORMAL HIGH (ref 0–200)
HDL: 106.7 mg/dL (ref >39.00)
LDL CALC: 133 mg/dL — AB (ref 0–99)
NONHDL: 146.27
Triglycerides: 66 mg/dL (ref 0.0–149.0)
VLDL: 13.2 mg/dL (ref 0.0–40.0)

## 2016-04-28 LAB — COMPREHENSIVE METABOLIC PANEL
ALT: 10 U/L (ref 0–35)
AST: 13 U/L (ref 0–37)
Albumin: 4.2 g/dL (ref 3.5–5.2)
Alkaline Phosphatase: 56 U/L (ref 39–117)
BILIRUBIN TOTAL: 0.6 mg/dL (ref 0.2–1.2)
BUN: 19 mg/dL (ref 6–23)
CHLORIDE: 104 meq/L (ref 96–112)
CO2: 31 meq/L (ref 19–32)
Calcium: 9.7 mg/dL (ref 8.4–10.5)
Creatinine, Ser: 1 mg/dL (ref 0.40–1.20)
GFR: 57.73 mL/min — AB (ref >60.00)
GLUCOSE: 117 mg/dL — AB (ref 70–99)
Potassium: 5 mEq/L (ref 3.5–5.1)
Sodium: 141 mEq/L (ref 135–145)
Total Protein: 6.8 g/dL (ref 6.0–8.3)

## 2016-04-28 LAB — HEMOGLOBIN A1C: HEMOGLOBIN A1C: 6 % (ref 4.6–6.5)

## 2016-04-28 LAB — TSH: TSH: 2.74 u[IU]/mL (ref 0.35–4.50)

## 2016-05-02 ENCOUNTER — Encounter: Payer: Self-pay | Admitting: Family Medicine

## 2016-05-02 ENCOUNTER — Ambulatory Visit (INDEPENDENT_AMBULATORY_CARE_PROVIDER_SITE_OTHER): Payer: Medicare Other | Admitting: Family Medicine

## 2016-05-02 VITALS — BP 152/90 | HR 75 | Temp 98.4°F | Resp 14 | Wt 192.2 lb

## 2016-05-02 DIAGNOSIS — M7711 Lateral epicondylitis, right elbow: Secondary | ICD-10-CM

## 2016-05-02 DIAGNOSIS — I10 Essential (primary) hypertension: Secondary | ICD-10-CM | POA: Diagnosis not present

## 2016-05-02 DIAGNOSIS — E785 Hyperlipidemia, unspecified: Secondary | ICD-10-CM | POA: Diagnosis not present

## 2016-05-02 DIAGNOSIS — R7303 Prediabetes: Secondary | ICD-10-CM | POA: Insufficient documentation

## 2016-05-02 MED ORDER — ATORVASTATIN CALCIUM 20 MG PO TABS: 20.0000 mg | ORAL_TABLET | Freq: Every day | ORAL | 3 refills | Status: DC

## 2016-05-02 NOTE — Assessment & Plan Note (Signed)
Noted on lab work. Patient would benefit from statin therapy given ASCVD risk score of 20.3%. Started on Lipitor. Check hepatic function panel and direct LDL in one month.

## 2016-05-02 NOTE — Assessment & Plan Note (Signed)
A1c 6.0. Patient will work on diet and exercise.

## 2016-05-02 NOTE — Progress Notes (Signed)
Pre visit review using our clinic review tool, if applicable. No additional management support is needed unless otherwise documented below in the visit note. 

## 2016-05-02 NOTE — Assessment & Plan Note (Signed)
Refer to sports medicine for consideration of ultrasound and injection.

## 2016-05-02 NOTE — Patient Instructions (Signed)
Nice to see you. We will going to refer you to sports medicine for your elbow. We will start you on Lipitor for your cholesterol. We'll check lab work in one month and follow-up in 2 months.

## 2016-05-02 NOTE — Assessment & Plan Note (Signed)
Slightly above goal today, though has been well controlled at home. Continue current regimen and she'll continue to monitor. If greater than 140/90 consistently she will contact us.

## 2016-05-02 NOTE — Progress Notes (Signed)
  Tommi Rumps, MD Phone: [phone]  Sabrina Wilson is a 76 y.o. female who presents today for follow-up.  Right lateral epicondylitis: Notes it's worse than it was previously. She bought the brace and has been icing it with little benefit. Hurts if she does anything with lifting.  Hypertension: Checks at home and it's always less than 140/90. No chest pain, shortness of breath, or edema. She is taking her blood pressure medications. She does note she has been rushing around and had quite a hectic holiday with the death of her sister on Christmas Eve. She thinks this may be playing a part in her blood pressure being slightly higher than previously.  I additionally discussed her lab work with her. Her cholesterol was elevated. Her ASCVD risk score was 20.3%. I discussed that by optimizing risk factors we could decrease her risk to 9.5%. She is willing to start on a cholesterol medication. Also discussed that she is prediabetic and that she is sore on diet and exercise for this.  PMH: Former smoker   ROS see history of present illness  Objective  Physical Exam Vitals:   05/02/16 1315 05/02/16 1336  BP: (!) 164/102 (!) 152/90  Pulse: 75   Resp: 14   Temp: 98.4 F (36.9 C)     BP Readings from Last 3 Encounters:  05/02/16 (!) 152/90  04/06/16 140/84  06/17/15 140/76   Wt Readings from Last 3 Encounters:  05/02/16 192 lb 3.2 oz (87.2 kg)  04/06/16 190 lb 12.8 oz (86.5 kg)  06/17/15 194 lb 0.1 oz (88 kg)    Physical Exam  Constitutional: No distress.  Cardiovascular: Normal rate, regular rhythm and normal heart sounds.   Pulmonary/Chest: Effort normal and breath sounds normal.  Musculoskeletal:  Mild tenderness over the right lateral epicondyle, no overlying skin changes, no surrounding tenderness, discomfort on resisted pronation and supination of the right forearm, benign exam of the left elbow  Neurological: She is alert. Gait normal.  Skin: She is not diaphoretic.       Assessment/Plan: Please see individual problem list.  Tennis elbow Refer to sports medicine for consideration of ultrasound and injection.  Hypertension Slightly above goal today, though has been well controlled at home. Continue current regimen and she'll continue to monitor. If greater than 140/90 consistently she will contact us.  Hyperlipidemia Noted on lab work. Patient would benefit from statin therapy given ASCVD risk score of 20.3%. Started on Lipitor. Check hepatic function panel and direct LDL in one month.  Prediabetes A1c 6.0. Patient will work on diet and exercise.   Orders Placed This Encounter  Procedures  . Hepatic function panel    Standing Status:   Future    Standing Expiration Date:   05/02/2017  . Direct LDL    Standing Status:   Future    Standing Expiration Date:   05/02/2017  . Ambulatory referral to Sports Medicine    Referral Priority:   Routine    Referral Type:   Consultation    Number of Visits Requested:   1    Meds ordered this encounter  Medications  . atorvastatin (LIPITOR) 20 MG tablet    Sig: Take 1 tablet (20 mg total) by mouth daily.    Dispense:  90 tablet    Refill:  [place], MD [place]

## 2016-05-03 ENCOUNTER — Encounter: Payer: Self-pay | Admitting: Family Medicine

## 2016-05-16 NOTE — Progress Notes (Signed)
[facility] [place], [place] [address] Phone: [phone] Subjective:    I'm seeing this patient by the request  of:  Tommi Rumps, MD   CC: Right elbow pain   [id number]  Sabrina Wilson is a 76 y.o. female coming in with complaint of right elbow pain. Lateral aspect. Been going on for months. Mild improvement with icing and brace. Any lifting and certain movements hurt.  No radiation, minimal weakness. Patient is a Radiographer, therapeutic. Patient states that she is still able to play. No but it seems to hurt afterwards. States that certain activities such as dressing or picking up her coffee cup sometimes can be severe pain. Patient rates the severity pain is 8 out of 10 and seems to be worsening over the course of time. Can have the certain range of motion but does not hurt at all.     Past Medical History:  Diagnosis Date  . Arthritis   . Colon cancer [facility]) 2009   T3,N0; s/p resection  Dr. Hulda Humphrey and chemotherapy by Dr. Cynda Acres  . Hernia of flank   . Hypertension   . Neuropathy ([place])   . Polyp at cervical os    s/p resection Dr. Sabra Heck   Past Surgical History:  Procedure Laterality Date  . BREAST BIOPSY Right 05/2014   Dr. Dwyane Luo office-benign  . BREAST SURGERY Right February 2016   Vacuum assisted biopsy for recurrent cyst, fibrocystic changes without evidence of malignancy.  Marland Kitchen CATARACT EXTRACTION W/PHACO Left 01/13/2015   Procedure: CATARACT EXTRACTION PHACO AND INTRAOCULAR LENS PLACEMENT (IOC);  Surgeon: Birder Robson, MD;  Location: [facility] ORS;  Service: Ophthalmology;  Laterality: Left;  [place]  1:02.9 AP   19.2 CDE  12.06 casette lot #  [id number]  . CATARACT EXTRACTION W/PHACO Right 02/03/2015   Procedure: CATARACT EXTRACTION PHACO AND INTRAOCULAR LENS PLACEMENT ([place]);  Surgeon: Birder Robson, MD;  Location: [facility] ORS;  Service: Ophthalmology;  Laterality: Right;  us00:[id number] ap46.5 [id number]  . COLECTOMY    . colostomy reversal    .  DILATION AND CURETTAGE OF UTERUS  2014   Dr. Sabra Heck, benign per pt  . HERNIA REPAIR  07/13/12   ventral   . TONSILLECTOMY     Social History   Social History  . Marital status: Married    Spouse name: N/A  . Number of children: N/A  . Years of education: N/A   Social History Main Topics  . Smoking status: Former Smoker    Quit date: 10/12/2007  . Smokeless tobacco: Never Used  . Alcohol use 0.6 oz/week    1 Standard drinks or equivalent per week     Comment: with dinner  . Drug use: No  . Sexual activity: Not on file   Other Topics Concern  . Not on file   Social History Narrative   Lives in [place] with husband. 2 children, son lives nearby.      Diet - regular      Exercise - none   Allergies  Allergen Reactions  . Sulfa Antibiotics Hives   Family History  Problem Relation Age of Onset  . Stroke Mother   . Diabetes Mother   . Cancer Father     colon  . Cancer Brother     colon  . Cancer Brother     brain    Past medical history, social, surgical and family history all reviewed in electronic medical record.  No pertanent information unless stated regarding  to the chief complaint.   Review of Systems:Review of systems updated and as accurate as of 05/16/16  No headache, visual changes, nausea, vomiting, diarrhea, constipation, dizziness, abdominal pain, skin rash, fevers, chills, night sweats, weight loss, swollen lymph nodes, body aches, joint swelling, muscle aches, chest pain, shortness of breath, mood changes. Positive neuropathy in hands and feet.  Objective  There were no vitals taken for this visit. Systems examined below as of 05/16/16   General: No apparent distress alert and oriented x3 mood and affect normal, dressed appropriately.  HEENT: Pupils equal, extraocular movements intact  Respiratory: Patient's speak in full sentences and does not appear short of breath  Cardiovascular: No lower extremity edema, non tender, no erythema  Skin: Warm  dry intact with no signs of infection or rash on extremities or on axial skeleton.  Abdomen: Soft nontender  Neuro: Cranial nerves II through XII are intact, neurovascularly intact in all extremities with 2+ DTRs and 2+ pulses.  Lymph: No lymphadenopathy of posterior or anterior cervical chain or axillae bilaterally.  Gait normal with good balance and coordination.  MSK:  Non tender with full range of motion and good stability and symmetric strength and tone of shoulders, wrist, hip, knee and ankles bilaterally. Mild arthritic changes of multiple joints. Elbow:Right  Unremarkable to inspection. Range of motion full pronation, supination, flexion, extension. Strength is full to all of the above directions Stable to varus, valgus stress. Negative moving valgus stress test. Tender to palpation over the lateral epicondylar region. Worsening pain with resisted extension of these middle finger. Ulnar nerve does not sublux. Negative cubital tunnel Tinel's. Contralateral elbow unremarkable  Musculoskeletal ultrasound was performed and interpreted by Charlann Boxer D.O.   Elbow: Right Lateral epicondyle and common extensor tendon origin visualized.  Intersubstance tearing noted. No true retraction noted. Mild hypoechoic changes and increasing Doppler flow noted. Radial head unremarkable and located in annular ligament Medial epicondyle and common flexor tendon origin visualized.  No edema, effusions, or avulsions seen. Ulnar nerve in cubital tunnel unremarkable. Olecranon and triceps insertion visualized and unremarkable without edema, effusion, or avulsion.  No signs olecranon bursitis. Power doppler signal normal.  IMPRESSION:  Lateral epicondylitis with intersubstance tearing  Procedure note [id number]; 15 minutes spent for Therapeutic exercises as stated in above notes.  This included exercises focusing on stretching, strengthening, with significant focus on eccentric aspects.  Flexion and extension  exercises Pt. given a formal rehab program. Series of concentric and eccentric exercises should be done starting with no weight, work up to 1 lb, hammer, etc.  Use counterforce strap if working or using hands.  Formal PT would be beneficial. Emphasized stretching an cross-friction massage Emphasized proper palms up lifting biomechanics to unload ECRB  Proper technique shown and discussed handout in great detail with ATC.  All questions were discussed and answered.      Impression and Recommendations:     This case required medical decision making of moderate complexity.      Note: This dictation was prepared with Dragon dictation along with smaller phrase technology. Any transcriptional errors that result from this process are unintentional.

## 2016-05-17 ENCOUNTER — Ambulatory Visit (INDEPENDENT_AMBULATORY_CARE_PROVIDER_SITE_OTHER): Payer: Medicare Other | Admitting: Family Medicine

## 2016-05-17 ENCOUNTER — Encounter: Payer: Self-pay | Admitting: Family Medicine

## 2016-05-17 ENCOUNTER — Ambulatory Visit: Payer: Self-pay

## 2016-05-17 VITALS — BP 140/80 | HR 73 | Ht 66.5 in | Wt 193.0 lb

## 2016-05-17 DIAGNOSIS — M7711 Lateral epicondylitis, right elbow: Secondary | ICD-10-CM

## 2016-05-17 DIAGNOSIS — M25521 Pain in right elbow: Secondary | ICD-10-CM

## 2016-05-17 MED ORDER — VITAMIN D (ERGOCALCIFEROL) 1.25 MG (50000 UNIT) PO CAPS: 50000.0000 [IU] | ORAL_CAPSULE | ORAL | 0 refills | Status: DC

## 2016-05-17 NOTE — Assessment & Plan Note (Signed)
Patient did have some intersubstance tearing noted. We discussed home exercises and patient work with Product/process development scientist. Given topical anti-inflammatories. With calcific changes noted we will start once weekly vitamin D. We discussed icing regimen. Discussed ergonomics and proper lifting technique. Patient will come back again in 3-4 weeks. Worsening symptoms we'll consider injection as well as referral to formal physical therapy.

## 2016-05-17 NOTE — Patient Instructions (Signed)
,  Good to see you.  Ice 20 minutes 2 times daily. Usually after activity and before bed. Exercises 3 times a week.  Wear wrist brace day and night for 2 weeks then nightly for 2 weeks.  Try to lift with palms up and with thumbs up only  pennsaid pinkie amount topically 2 times daily as needed.  Once weekly vitamin D for next 12 weeks.  See me again in 3 weeks and if not better we will do injection

## 2016-05-21 ENCOUNTER — Other Ambulatory Visit: Payer: Self-pay | Admitting: Family Medicine

## 2016-06-09 ENCOUNTER — Encounter: Payer: Self-pay | Admitting: Family Medicine

## 2016-06-09 ENCOUNTER — Ambulatory Visit: Payer: Self-pay

## 2016-06-09 ENCOUNTER — Ambulatory Visit (INDEPENDENT_AMBULATORY_CARE_PROVIDER_SITE_OTHER): Payer: Medicare Other | Admitting: Family Medicine

## 2016-06-09 VITALS — BP 140/80 | HR 68 | Ht 67.0 in | Wt 190.0 lb

## 2016-06-09 DIAGNOSIS — M25521 Pain in right elbow: Secondary | ICD-10-CM

## 2016-06-09 DIAGNOSIS — M7711 Lateral epicondylitis, right elbow: Secondary | ICD-10-CM | POA: Diagnosis not present

## 2016-06-09 NOTE — Patient Instructions (Signed)
God to see you  Ice is still your friend Injected today  Nicole Kindred PT will be calling you.  I am ok with you still playing but wear the brace day and night for 1 week then stop

## 2016-06-09 NOTE — Assessment & Plan Note (Signed)
Patient given injection today. Discussed icing regimen. We discussed home exercises. Patient encouraged to continue conservative therapy. Sent to PT due to worsening exam.  RTC in 4 weeks.

## 2016-06-09 NOTE — Progress Notes (Signed)
[facility] [address], [place] [address] Phone: [phone] Subjective:     CC: Right elbow pain f/u  [id number]  Sabrina Wilson is a 76 y.o. female coming in with complaint of right elbow pain. Lateral aspect. Done an more on the lateral epicondylar region. Patient states that she did not make any significant improvement. Still has the discomfort. Did wear the brace regularly but did not do the exercises regularly. States that if anything it seems to be worsening.     Past Medical History:  Diagnosis Date  . Arthritis   . Colon cancer [facility]) 2009   T3,N0; s/p resection  Dr. Hulda Humphrey and chemotherapy by Dr. Cynda Acres  . Hernia of flank   . Hypertension   . Neuropathy ([place])   . Polyp at cervical os    s/p resection Dr. Sabra Heck   Past Surgical History:  Procedure Laterality Date  . BREAST BIOPSY Right 05/2014   Dr. Dwyane Luo office-benign  . BREAST SURGERY Right February 2016   Vacuum assisted biopsy for recurrent cyst, fibrocystic changes without evidence of malignancy.  Marland Kitchen CATARACT EXTRACTION W/PHACO Left 01/13/2015   Procedure: CATARACT EXTRACTION PHACO AND INTRAOCULAR LENS PLACEMENT (IOC);  Surgeon: Birder Robson, MD;  Location: [facility] ORS;  Service: Ophthalmology;  Laterality: Left;  [place]  1:02.9 AP   19.2 CDE  12.06 casette lot #  [id number]  . CATARACT EXTRACTION W/PHACO Right 02/03/2015   Procedure: CATARACT EXTRACTION PHACO AND INTRAOCULAR LENS PLACEMENT ([place]);  Surgeon: Birder Robson, MD;  Location: [facility] ORS;  Service: Ophthalmology;  Laterality: Right;  us00:53 ap46.5 cde10.79  . COLECTOMY    . colostomy reversal    . DILATION AND CURETTAGE OF UTERUS  2014   Dr. Sabra Heck, benign per pt  . HERNIA REPAIR  07/13/12   ventral   . TONSILLECTOMY     Social History   Social History  . Marital status: Married    Spouse name: N/A  . Number of children: N/A  . Years of education: N/A   Social History Main Topics  . Smoking  status: Former Smoker    Quit date: 10/12/2007  . Smokeless tobacco: Never Used  . Alcohol use 0.6 oz/week    1 Standard drinks or equivalent per week     Comment: with dinner  . Drug use: No  . Sexual activity: Not Asked   Other Topics Concern  . None   Social History Narrative   Lives in [place] with husband. 2 children, son lives nearby.      Diet - regular      Exercise - none   Allergies  Allergen Reactions  . Sulfa Antibiotics Hives   Family History  Problem Relation Age of Onset  . Stroke Mother   . Diabetes Mother   . Cancer Father     colon  . Cancer Brother     colon  . Cancer Brother     brain    Past medical history, social, surgical and family history all reviewed in electronic medical record.  No pertanent information unless stated regarding to the chief complaint.   Review of Systems: No headache, visual changes, nausea, vomiting, diarrhea, constipation, dizziness, abdominal pain, skin rash, fevers, chills, night sweats, weight loss, swollen lymph nodes, body aches, joint swelling, muscle aches, chest pain, shortness of breath, mood changes.   Positive neuropathy in hands and feet.  Objective  Blood pressure 140/80, pulse 68, height 5\' 7"  (1.702 m), weight 190  lb (86.2 kg). Systems examined below as of 06/09/16   Systems examined below as of 06/09/16 General: NAD A&O x3 mood, affect normal  HEENT: Pupils equal, extraocular movements intact no nystagmus Respiratory: not short of breath at rest or with speaking Cardiovascular: No lower extremity edema, non tender Skin: Warm dry intact with no signs of infection or rash on extremities or on axial skeleton. Abdomen: Soft nontender, no masses Neuro: Cranial nerves  intact, neurovascularly intact in all extremities with 2+ DTRs and 2+ pulses. Lymph: No lymphadenopathy appreciated today  Gait normal with good balance and coordination.  MSK: Non tender with full range of motion and good stability and  symmetric strength and tone of shoulders, wrist,  knee hips and ankles bilaterally.   MSK:  Non tender with full range of motion and good stability and symmetric strength and tone of shoulders, wrist, hip, knee and ankles bilaterally. Mild arthritic changes of multiple joints. Elbow:Right  Unremarkable to inspection. Range of motion full pronation, supination, flexion, extension. Strength is full to all of the above directions Stable to varus, valgus stress. Negative moving valgus stress test. Worsening tenderness to palpation over the lateral epicondylar region  Worsening pain with resisted extension of these middle finger possibly worsening. Ulnar nerve does not sublux. Negative cubital tunnel Tinel's. Contralateral elbow unremarkable  Procedure: Real-time Ultrasound Guided Injection of lateral epicondylar region and the extensor tendon sheath Device: GE Logiq Q7 Ultrasound guided injection is preferred based studies that show increased duration, increased effect, greater accuracy, decreased procedural pain, increased response rate, and decreased cost with ultrasound guided versus blind injection.  Verbal informed consent obtained.  Time-out conducted.  Noted no overlying erythema, induration, or other signs of local infection.  Skin prepped in a sterile fashion.  Local anesthesia: Topical Ethyl chloride.  With sterile technique and under real time ultrasound guidance: With a 25-gauge half-inch needle injected with 0.5 mL of Marcaine and 0.5 mL of Kenalog.  Completed without difficulty  Pain immediately resolved suggesting accurate placement of the medication.  Advised to call if fevers/chills, erythema, induration, drainage, or persistent bleeding.  Images permanently stored and available for review in the ultrasound unit.  Impression: Technically successful ultrasound guided injection.     Impression and Recommendations:     This case required medical decision making of moderate  complexity.      Note: This dictation was prepared with Dragon dictation along with smaller phrase technology. Any transcriptional errors that result from this process are unintentional.

## 2016-06-16 ENCOUNTER — Other Ambulatory Visit: Payer: Medicare Other

## 2016-06-16 ENCOUNTER — Ambulatory Visit: Payer: Medicare Other | Admitting: Internal Medicine

## 2016-06-17 ENCOUNTER — Inpatient Hospital Stay (HOSPITAL_BASED_OUTPATIENT_CLINIC_OR_DEPARTMENT_OTHER): Payer: Medicare Other | Admitting: Internal Medicine

## 2016-06-17 ENCOUNTER — Inpatient Hospital Stay: Payer: Medicare Other | Attending: Internal Medicine

## 2016-06-17 DIAGNOSIS — Z9221 Personal history of antineoplastic chemotherapy: Secondary | ICD-10-CM

## 2016-06-17 DIAGNOSIS — C187 Malignant neoplasm of sigmoid colon: Secondary | ICD-10-CM

## 2016-06-17 DIAGNOSIS — Z85038 Personal history of other malignant neoplasm of large intestine: Secondary | ICD-10-CM

## 2016-06-17 DIAGNOSIS — C189 Malignant neoplasm of colon, unspecified: Secondary | ICD-10-CM

## 2016-06-17 DIAGNOSIS — I10 Essential (primary) hypertension: Secondary | ICD-10-CM | POA: Diagnosis not present

## 2016-06-17 DIAGNOSIS — Z87891 Personal history of nicotine dependence: Secondary | ICD-10-CM | POA: Insufficient documentation

## 2016-06-17 HISTORY — DX: Malignant neoplasm of sigmoid colon: C18.7

## 2016-06-17 LAB — COMPREHENSIVE METABOLIC PANEL
ALK PHOS: 64 U/L (ref 38–126)
ALT: 18 U/L (ref 14–54)
AST: 21 U/L (ref 15–41)
Albumin: 4.2 g/dL (ref 3.5–5.0)
Anion gap: 8 (ref 5–15)
BUN: 23 mg/dL — ABNORMAL HIGH (ref 6–20)
CALCIUM: 9.7 mg/dL (ref 8.9–10.3)
CO2: 26 mmol/L (ref 22–32)
CREATININE: 0.96 mg/dL (ref 0.44–1.00)
Chloride: 102 mmol/L (ref 101–111)
GFR calc Af Amer: >60 mL/min (ref >60)
GFR calc non Af Amer: 57 mL/min — ABNORMAL LOW (ref >60)
GLUCOSE: 119 mg/dL — AB (ref 65–99)
Potassium: 4.7 mmol/L (ref 3.5–5.1)
SODIUM: 136 mmol/L (ref 135–145)
Total Bilirubin: 0.5 mg/dL (ref 0.3–1.2)
Total Protein: 7.5 g/dL (ref 6.5–8.1)

## 2016-06-17 LAB — CBC WITH DIFFERENTIAL/PLATELET
BASOS PCT: 1 %
Basophils Absolute: 0.1 10*3/uL (ref 0–0.1)
Eosinophils Absolute: 0.3 10*3/uL (ref 0–0.7)
Eosinophils Relative: 4 %
HCT: 36.3 % (ref 35.0–47.0)
HEMOGLOBIN: 12.4 g/dL (ref 12.0–16.0)
Lymphocytes Relative: 28 %
Lymphs Abs: 2 10*3/uL (ref 1.0–3.6)
MCH: 32.2 pg (ref 26.0–34.0)
MCHC: 34.1 g/dL (ref 32.0–36.0)
MCV: 94.3 fL (ref 80.0–100.0)
Monocytes Absolute: 0.6 10*3/uL (ref 0.2–0.9)
Monocytes Relative: 8 %
NEUTROS PCT: 59 %
Neutro Abs: 4.2 10*3/uL (ref 1.4–6.5)
Platelets: 282 10*3/uL (ref 150–440)
RBC: 3.85 MIL/uL (ref 3.80–5.20)
RDW: 13.3 % (ref 11.5–14.5)
WBC: 7.1 10*3/uL (ref 3.6–11.0)

## 2016-06-17 NOTE — Progress Notes (Signed)
Patient here today for follow up.  Patient states no new concerns today  

## 2016-06-17 NOTE — Progress Notes (Signed)
[facility] OFFICE PROGRESS NOTE  Patient Care Team: Leone Haven, MD as PCP - General (Family Medicine) Robert Bellow, MD as Consulting Physician (General Surgery) Dallas Schimke, MD (Internal Medicine)  Cancer Staging No matching staging information was found for the patient.   Oncology History   # 2009- Sigmoid colon cancer stage II (T3 N0 4 lymph nodes sampled M0 stage II high risk there was obstruction and perforation abscess; Dr.Hern), workup included a low CEA preoperatively and a chest x-ray and CT scan abdomen pelvis negative for metastatic disease, S/P colostomy; s/p FOLFOX x 6 months.   # LATER REVERSED , K RAS  wild type B RAF not evaluated   Also initial iron deficiency with anemia thrombocytosis, high platelets considered reactive, workup includes a normal PCR for CML and a negative [id number] mutation     Cancer of sigmoid [facility])     This is my first interaction with the patient; Patient's previous treating oncologist was Dr. Inez Pilgrim. I reviewed the patient's prior charts/pertinent labs/imaging in detail; findings are summarized above.    INTERVAL HISTORY:  Sabrina Wilson 76 y.o.  female pleasant patient above history of Colon cancer history for follow-up.  Patient denies abdominal pain. Denies any shortness of breath or cough. Appetite good. Chronic mild tingling and numbness in her hands and feet. No blood in stools or black stools.  REVIEW OF SYSTEMS:  A complete 10 point review of system is done which is negative except mentioned above/history of present illness.   PAST MEDICAL HISTORY :  Past Medical History:  Diagnosis Date  . Arthritis   . Colon cancer [facility]) 2009   T3,N0; s/p resection  Dr. Hulda Humphrey and chemotherapy by Dr. Cynda Acres  . Hernia of flank   . Hypertension   . Neuropathy ([place])   . Polyp at cervical os    s/p resection Dr. Sabra Heck    PAST SURGICAL HISTORY :   Past Surgical History:  Procedure Laterality Date  . BREAST  BIOPSY Right 05/2014   Dr. Dwyane Luo office-benign  . BREAST SURGERY Right February 2016   Vacuum assisted biopsy for recurrent cyst, fibrocystic changes without evidence of malignancy.  Marland Kitchen CATARACT EXTRACTION W/PHACO Left 01/13/2015   Procedure: CATARACT EXTRACTION PHACO AND INTRAOCULAR LENS PLACEMENT (IOC);  Surgeon: Birder Robson, MD;  Location: [facility] ORS;  Service: Ophthalmology;  Laterality: Left;  [place]  1:02.9 AP   19.2 CDE  12.06 casette lot #  [id number]  . CATARACT EXTRACTION W/PHACO Right 02/03/2015   Procedure: CATARACT EXTRACTION PHACO AND INTRAOCULAR LENS PLACEMENT ([place]);  Surgeon: Birder Robson, MD;  Location: [facility] ORS;  Service: Ophthalmology;  Laterality: Right;  us00:53 ap46.5 cde10.79  . COLECTOMY    . colostomy reversal    . DILATION AND CURETTAGE OF UTERUS  2014   Dr. Sabra Heck, benign per pt  . HERNIA REPAIR  07/13/12   ventral   . TONSILLECTOMY      FAMILY HISTORY :   Family History  Problem Relation Age of Onset  . Stroke Mother   . Diabetes Mother   . Cancer Father     colon  . Cancer Brother     colon  . Cancer Brother     brain    SOCIAL HISTORY:   Social History  Substance Use Topics  . Smoking status: Former Smoker    Quit date: 10/12/2007  . Smokeless tobacco: Never Used  . Alcohol use 0.6 oz/week    1 Standard drinks or  equivalent per week     Comment: with dinner    ALLERGIES:  is allergic to sulfa antibiotics.  MEDICATIONS:  Current Outpatient Prescriptions  Medication Sig Dispense Refill  . atorvastatin (LIPITOR) 20 MG tablet Take 1 tablet (20 mg total) by mouth daily. 90 tablet 3  . fluticasone (FLONASE) 50 MCG/ACT nasal spray Place 2 sprays into both nostrils daily. 16 g 6  . losartan (COZAAR) 100 MG tablet TAKE ONE TABLET BY MOUTH DAILY 90 tablet 1  . Omega-3 Fatty Acids (FISH OIL PO) Take by mouth.    . Vitamin D, Ergocalciferol, (DRISDOL) 50000 units CAPS capsule Take 1 capsule (50,000 Units total) by mouth every 7 (seven) days. 12  capsule 0   No current facility-administered medications for this visit.     PHYSICAL EXAMINATION: ECOG PERFORMANCE STATUS: 0 - Asymptomatic  BP (!) 149/79 (BP Location: Left Arm, Patient Position: Sitting)   Pulse 67   Temp 97.9 F (36.6 C) (Tympanic)   Wt 193 lb (87.5 kg)   BMI 30.23 kg/m   Filed Weights   06/17/16 1438  Weight: 193 lb (87.5 kg)    GENERAL: Well-nourished well-developed; Alert, no distress and comfortable.   alone EYES: no pallor or icterus OROPHARYNX: no thrush or ulceration; good dentition  NECK: supple, no masses felt LYMPH:  no palpable lymphadenopathy in the cervical, axillary or inguinal regions LUNGS: clear to auscultation and  No wheeze or crackles HEART/CVS: regular rate & rhythm and no murmurs; No lower extremity edema ABDOMEN:abdomen soft, non-tender and normal bowel sounds Musculoskeletal:no cyanosis of digits and no clubbing  PSYCH: alert & oriented x 3 with fluent speech NEURO: no focal motor/sensory deficits SKIN:  no rashes or significant lesions  LABORATORY DATA:  I have reviewed the data as listed    Component Value Date/Time   NA 136 06/17/2016 1412   NA 133 (L) 07/15/2012 2105   K 4.7 06/17/2016 1412   K 3.8 07/18/2012 0503   CL 102 06/17/2016 1412   CL 100 07/15/2012 2105   CO2 26 06/17/2016 1412   CO2 28 07/15/2012 2105   GLUCOSE 119 (H) 06/17/2016 1412   GLUCOSE 136 (H) 07/15/2012 2105   BUN 23 (H) 06/17/2016 1412   BUN 10 07/15/2012 2105   CREATININE 0.96 06/17/2016 1412   CREATININE 0.68 07/15/2012 2105   CALCIUM 9.7 06/17/2016 1412   CALCIUM 8.4 (L) 07/15/2012 2105   PROT 7.5 06/17/2016 1412   PROT 7.5 06/22/2011 1323   ALBUMIN 4.2 06/17/2016 1412   ALBUMIN 3.8 06/22/2011 1323   AST 21 06/17/2016 1412   AST 14 (L) 06/22/2011 1323   ALT 18 06/17/2016 1412   ALT 24 06/22/2011 1323   ALKPHOS 64 06/17/2016 1412   ALKPHOS 72 06/22/2011 1323   BILITOT 0.5 06/17/2016 1412   BILITOT 0.4 06/22/2011 1323   GFRNONAA  57 (L) 06/17/2016 1412   GFRNONAA >60 07/15/2012 2105   GFRAA >60 06/17/2016 1412   GFRAA >60 07/15/2012 2105    No results found for: SPEP, UPEP  Lab Results  Component Value Date   WBC 7.1 06/17/2016   NEUTROABS 4.2 06/17/2016   HGB 12.4 06/17/2016   HCT 36.3 06/17/2016   MCV 94.3 06/17/2016   PLT 282 06/17/2016      Chemistry      Component Value Date/Time   NA 136 06/17/2016 1412   NA 133 (L) 07/15/2012 2105   K 4.7 06/17/2016 1412   K 3.8 07/18/2012 0503   CL  102 06/17/2016 1412   CL 100 07/15/2012 2105   CO2 26 06/17/2016 1412   CO2 28 07/15/2012 2105   BUN 23 (H) 06/17/2016 1412   BUN 10 07/15/2012 2105   CREATININE 0.96 06/17/2016 1412   CREATININE 0.68 07/15/2012 2105      Component Value Date/Time   CALCIUM 9.7 06/17/2016 1412   CALCIUM 8.4 (L) 07/15/2012 2105   ALKPHOS 64 06/17/2016 1412   ALKPHOS 72 06/22/2011 1323   AST 21 06/17/2016 1412   AST 14 (L) 06/22/2011 1323   ALT 18 06/17/2016 1412   ALT 24 06/22/2011 1323   BILITOT 0.5 06/17/2016 1412   BILITOT 0.4 06/22/2011 1323       RADIOGRAPHIC STUDIES: I have personally reviewed the radiological images as listed and agreed with the findings in the report. No results found.   ASSESSMENT & PLAN:  Cancer of sigmoid ([place]) # stage II sigmoid colon cancer- [2009] high risk features status post chemotherapy. Clinically no evidence of recurrence.  # Call Dr. Bary Castilla re: follow up colonoscopy.  # Follow up 1 year/labs.    Orders Placed This Encounter  Procedures  . CBC with Differential    Standing Status:   Future    Standing Expiration Date:   12/15/2017  . Comprehensive metabolic panel    Standing Status:   Future    Standing Expiration Date:   12/15/2017  . CEA    Standing Status:   Future    Standing Expiration Date:   12/15/2017   All questions were answered. The patient knows to call the clinic with any problems, questions or concerns.      Cammie Sickle, MD 06/21/2016 7:54  AM

## 2016-06-17 NOTE — Progress Notes (Signed)
.  gb 

## 2016-06-17 NOTE — Assessment & Plan Note (Addendum)
#   stage II sigmoid colon cancer- [2009] high risk features status post chemotherapy. Clinically no evidence of recurrence.  # Call Dr. Bary Castilla re: follow up colonoscopy.  # Follow up 1 year/labs.

## 2016-06-18 LAB — CEA: CEA: 1.1 ng/mL (ref 0.0–4.7)

## 2016-06-20 DIAGNOSIS — M7711 Lateral epicondylitis, right elbow: Secondary | ICD-10-CM | POA: Diagnosis not present

## 2016-06-27 DIAGNOSIS — M7711 Lateral epicondylitis, right elbow: Secondary | ICD-10-CM | POA: Diagnosis not present

## 2016-07-08 DIAGNOSIS — M7711 Lateral epicondylitis, right elbow: Secondary | ICD-10-CM | POA: Diagnosis not present

## 2016-07-11 DIAGNOSIS — M7711 Lateral epicondylitis, right elbow: Secondary | ICD-10-CM | POA: Diagnosis not present

## 2016-07-12 ENCOUNTER — Ambulatory Visit (INDEPENDENT_AMBULATORY_CARE_PROVIDER_SITE_OTHER): Payer: Medicare Other | Admitting: Family Medicine

## 2016-07-12 ENCOUNTER — Encounter: Payer: Self-pay | Admitting: Family Medicine

## 2016-07-12 VITALS — BP 120/84 | HR 73 | Temp 98.1°F | Wt 190.2 lb

## 2016-07-12 DIAGNOSIS — J309 Allergic rhinitis, unspecified: Secondary | ICD-10-CM

## 2016-07-12 DIAGNOSIS — E785 Hyperlipidemia, unspecified: Secondary | ICD-10-CM | POA: Diagnosis not present

## 2016-07-12 DIAGNOSIS — M7711 Lateral epicondylitis, right elbow: Secondary | ICD-10-CM | POA: Diagnosis not present

## 2016-07-12 DIAGNOSIS — G629 Polyneuropathy, unspecified: Secondary | ICD-10-CM | POA: Diagnosis not present

## 2016-07-12 DIAGNOSIS — I10 Essential (primary) hypertension: Secondary | ICD-10-CM | POA: Diagnosis not present

## 2016-07-12 LAB — COMPREHENSIVE METABOLIC PANEL
ALBUMIN: 4.3 g/dL (ref 3.5–5.2)
ALT: 17 U/L (ref 0–35)
AST: 21 U/L (ref 0–37)
Alkaline Phosphatase: 68 U/L (ref 39–117)
BUN: 25 mg/dL — ABNORMAL HIGH (ref 6–23)
CALCIUM: 10.1 mg/dL (ref 8.4–10.5)
CHLORIDE: 101 meq/L (ref 96–112)
CO2: 28 meq/L (ref 19–32)
CREATININE: 0.92 mg/dL (ref 0.40–1.20)
GFR: 63.52 mL/min (ref >60.00)
Glucose, Bld: 99 mg/dL (ref 70–99)
POTASSIUM: 4.5 meq/L (ref 3.5–5.1)
Sodium: 138 mEq/L (ref 135–145)
Total Bilirubin: 0.5 mg/dL (ref 0.2–1.2)
Total Protein: 7.2 g/dL (ref 6.0–8.3)

## 2016-07-12 LAB — LDL CHOLESTEROL, DIRECT: LDL DIRECT: 56 mg/dL

## 2016-07-12 NOTE — Assessment & Plan Note (Signed)
Long history of neuropathy related to her chemotherapy. She occasionally gets muscle cramps as well. We'll check a CMP to evaluate that. She has been on gabapentin and Lyrica in the past with no benefit. She'll continue to monitor.

## 2016-07-12 NOTE — Assessment & Plan Note (Signed)
Tolerating Lipitor. Check CMP and direct LDL.

## 2016-07-12 NOTE — Assessment & Plan Note (Signed)
Improved. She can continue Flonase if needed.

## 2016-07-12 NOTE — Patient Instructions (Signed)
Nice to see you. We will check some lab work and contact you with the results.

## 2016-07-12 NOTE — Assessment & Plan Note (Signed)
Much improved. Continue with physical therapy.

## 2016-07-12 NOTE — Assessment & Plan Note (Signed)
At goal. Continue current medications. Check CMP. 

## 2016-07-12 NOTE — Progress Notes (Signed)
Pre visit review using our clinic review tool, if applicable. No additional management support is needed unless otherwise documented below in the visit note. 

## 2016-07-12 NOTE — Progress Notes (Signed)
  Tommi Rumps, MD Phone: [phone]  Sabrina Wilson is a 76 y.o. female who presents today for f/u.  HYPERTENSION  Disease Monitoring  Home BP Monitoring 130-140/70's Chest pain- no    Dyspnea- no Medications  Compliance-  Taking losartan.  Edema- no  Hyperlipidemia: Currently taking Lipitor. No myalgias or right upper quadrant pain. No claudication.  Patient notes chronic intermittent occasional cramps in her feet and lower legs. Notes if she is on her feet for too long this occurs. No claudication symptoms. She does have chronic neuropathy related to her prior chemotherapy. She has tried gabapentin and Lyrica for these in the past. She notes she just gets up and walks and then stretches and the cramps go away.  Lateral epicondylitis: This has improved significantly with injection and therapy. Seeing sports medicine for this.  Allergic rhinitis: She notes her runny nose and cough improved significantly. No longer using Flonase.   PMH: former smoker   ROS see history of present illness  Objective  Physical Exam Vitals:   07/12/16 1323  BP: 120/84  Pulse: 73  Temp: 98.1 F (36.7 C)    BP Readings from Last 3 Encounters:  07/12/16 120/84  06/17/16 (!) 149/79  06/09/16 140/80   Wt Readings from Last 3 Encounters:  07/12/16 190 lb 3.2 oz (86.3 kg)  06/17/16 193 lb (87.5 kg)  06/09/16 190 lb (86.2 kg)    Physical Exam  Constitutional: No distress.  HENT:  Head: Normocephalic and atraumatic.  Cardiovascular: Normal rate, regular rhythm and normal heart sounds.   2+ DP pulses  Pulmonary/Chest: Effort normal and breath sounds normal.  Musculoskeletal: She exhibits no edema.  No calf tenderness or cramps noted, minimal tenderness over the right lateral epicondyles with little discomfort on rotational range of motion of forearm  Neurological: She is alert. Gait normal.  Sensation light touch intact bilateral hands and lower extremities  Skin: She is not  diaphoretic.     Assessment/Plan: Please see individual problem list.  Hypertension At goal. Continue current medications. Check CMP.  Hyperlipidemia Tolerating Lipitor. Check CMP and direct LDL.  Right lateral epicondylitis Much improved. Continue with physical therapy.  Neuropathy ([place]) Long history of neuropathy related to her chemotherapy. She occasionally gets muscle cramps as well. We'll check a CMP to evaluate that. She has been on gabapentin and Lyrica in the past with no benefit. She'll continue to monitor.  Allergic rhinitis Improved. She can continue Flonase if needed.   Orders Placed This Encounter  Procedures  . Comp Met (CMET)  . Direct LDL    Tommi Rumps, MD [place]

## 2016-07-18 DIAGNOSIS — M7711 Lateral epicondylitis, right elbow: Secondary | ICD-10-CM | POA: Diagnosis not present

## 2016-07-25 DIAGNOSIS — M7711 Lateral epicondylitis, right elbow: Secondary | ICD-10-CM | POA: Diagnosis not present

## 2016-08-06 ENCOUNTER — Other Ambulatory Visit: Payer: Self-pay | Admitting: Family Medicine

## 2016-09-01 DIAGNOSIS — L821 Other seborrheic keratosis: Secondary | ICD-10-CM | POA: Diagnosis not present

## 2016-09-01 DIAGNOSIS — L578 Other skin changes due to chronic exposure to nonionizing radiation: Secondary | ICD-10-CM | POA: Diagnosis not present

## 2016-09-01 DIAGNOSIS — L812 Freckles: Secondary | ICD-10-CM | POA: Diagnosis not present

## 2016-09-01 DIAGNOSIS — L820 Inflamed seborrheic keratosis: Secondary | ICD-10-CM | POA: Diagnosis not present

## 2016-11-04 ENCOUNTER — Other Ambulatory Visit: Payer: Self-pay | Admitting: Family Medicine

## 2016-11-04 NOTE — Telephone Encounter (Signed)
Refill done.  

## 2016-11-17 DIAGNOSIS — H43813 Vitreous degeneration, bilateral: Secondary | ICD-10-CM | POA: Diagnosis not present

## 2016-11-20 ENCOUNTER — Other Ambulatory Visit: Payer: Self-pay | Admitting: Family Medicine

## 2017-01-23 ENCOUNTER — Other Ambulatory Visit: Payer: Self-pay | Admitting: Family Medicine

## 2017-02-07 ENCOUNTER — Ambulatory Visit (INDEPENDENT_AMBULATORY_CARE_PROVIDER_SITE_OTHER): Payer: Medicare Other

## 2017-02-07 DIAGNOSIS — Z23 Encounter for immunization: Secondary | ICD-10-CM

## 2017-02-24 ENCOUNTER — Other Ambulatory Visit: Payer: Self-pay | Admitting: Family Medicine

## 2017-03-07 ENCOUNTER — Other Ambulatory Visit: Payer: Self-pay | Admitting: Family Medicine

## 2017-03-07 DIAGNOSIS — Z1231 Encounter for screening mammogram for malignant neoplasm of breast: Secondary | ICD-10-CM

## 2017-04-13 ENCOUNTER — Ambulatory Visit
Admission: RE | Admit: 2017-04-13 | Discharge: 2017-04-13 | Disposition: A | Discharge status: Home / Self Care | Payer: Medicare Other | Source: Ambulatory Visit | Attending: Family Medicine | Admitting: Family Medicine

## 2017-04-13 DIAGNOSIS — Z1231 Encounter for screening mammogram for malignant neoplasm of breast: Secondary | ICD-10-CM | POA: Insufficient documentation

## 2017-04-18 ENCOUNTER — Other Ambulatory Visit: Payer: Self-pay | Admitting: Family Medicine

## 2017-04-20 NOTE — Telephone Encounter (Signed)
Refill denied. Pt needs an appt. 

## 2017-04-26 ENCOUNTER — Other Ambulatory Visit: Payer: Self-pay | Admitting: Family Medicine

## 2017-04-26 MED ORDER — LOSARTAN POTASSIUM 100 MG PO TABS: 100.0000 mg | ORAL_TABLET | Freq: Every day | ORAL | 0 refills | Status: DC

## 2017-04-27 NOTE — Telephone Encounter (Signed)
See prior note

## 2017-04-27 NOTE — Telephone Encounter (Signed)
She needs a vitamin d level checked prior to refilling. Thanks.

## 2017-04-28 NOTE — Telephone Encounter (Signed)
Patient notified and will have this checked at her [place]

## 2017-05-03 ENCOUNTER — Other Ambulatory Visit: Payer: Self-pay | Admitting: Family Medicine

## 2017-05-29 ENCOUNTER — Other Ambulatory Visit: Payer: Self-pay | Admitting: Family Medicine

## 2017-06-07 ENCOUNTER — Encounter: Payer: Medicare Other | Admitting: Family Medicine

## 2017-06-19 ENCOUNTER — Inpatient Hospital Stay: Payer: Medicare Other | Attending: Internal Medicine

## 2017-06-19 ENCOUNTER — Inpatient Hospital Stay (HOSPITAL_BASED_OUTPATIENT_CLINIC_OR_DEPARTMENT_OTHER): Payer: Medicare Other | Admitting: Internal Medicine

## 2017-06-19 ENCOUNTER — Encounter: Payer: Self-pay | Admitting: Internal Medicine

## 2017-06-19 VITALS — BP 168/100 | HR 67 | Temp 97.9°F | Resp 16 | Wt 194.8 lb

## 2017-06-19 DIAGNOSIS — C187 Malignant neoplasm of sigmoid colon: Secondary | ICD-10-CM

## 2017-06-19 DIAGNOSIS — G629 Polyneuropathy, unspecified: Secondary | ICD-10-CM | POA: Insufficient documentation

## 2017-06-19 DIAGNOSIS — I10 Essential (primary) hypertension: Secondary | ICD-10-CM | POA: Insufficient documentation

## 2017-06-19 DIAGNOSIS — J209 Acute bronchitis, unspecified: Secondary | ICD-10-CM

## 2017-06-19 DIAGNOSIS — Z79899 Other long term (current) drug therapy: Secondary | ICD-10-CM

## 2017-06-19 DIAGNOSIS — Z87891 Personal history of nicotine dependence: Secondary | ICD-10-CM | POA: Diagnosis not present

## 2017-06-19 DIAGNOSIS — Z85038 Personal history of other malignant neoplasm of large intestine: Secondary | ICD-10-CM

## 2017-06-19 DIAGNOSIS — Z9221 Personal history of antineoplastic chemotherapy: Secondary | ICD-10-CM | POA: Insufficient documentation

## 2017-06-19 LAB — CBC WITH DIFFERENTIAL/PLATELET
Basophils Absolute: 0.1 10*3/uL (ref 0–0.1)
Basophils Relative: 1 %
EOS ABS: 0.3 10*3/uL (ref 0–0.7)
Eosinophils Relative: 5 %
HCT: 35.9 % (ref 35.0–47.0)
HEMOGLOBIN: 12 g/dL (ref 12.0–16.0)
LYMPHS ABS: 1.8 10*3/uL (ref 1.0–3.6)
LYMPHS PCT: 27 %
MCH: 32 pg (ref 26.0–34.0)
MCHC: 33.4 g/dL (ref 32.0–36.0)
MCV: 96 fL (ref 80.0–100.0)
Monocytes Absolute: 0.7 10*3/uL (ref 0.2–0.9)
Monocytes Relative: 10 %
NEUTROS PCT: 57 %
Neutro Abs: 3.8 10*3/uL (ref 1.4–6.5)
Platelets: 280 10*3/uL (ref 150–440)
RBC: 3.74 MIL/uL — AB (ref 3.80–5.20)
RDW: 13.4 % (ref 11.5–14.5)
WBC: 6.6 10*3/uL (ref 3.6–11.0)

## 2017-06-19 LAB — COMPREHENSIVE METABOLIC PANEL
ALT: 15 U/L (ref 14–54)
AST: 18 U/L (ref 15–41)
Albumin: 3.8 g/dL (ref 3.5–5.0)
Alkaline Phosphatase: 66 U/L (ref 38–126)
Anion gap: 6 (ref 5–15)
BUN: 23 mg/dL — ABNORMAL HIGH (ref 6–20)
CHLORIDE: 105 mmol/L (ref 101–111)
CO2: 26 mmol/L (ref 22–32)
CREATININE: 1.04 mg/dL — AB (ref 0.44–1.00)
Calcium: 9.4 mg/dL (ref 8.9–10.3)
GFR, EST AFRICAN AMERICAN: 60 mL/min — AB (ref >60)
GFR, EST NON AFRICAN AMERICAN: 52 mL/min — AB (ref >60)
Glucose, Bld: 100 mg/dL — ABNORMAL HIGH (ref 65–99)
POTASSIUM: 4.5 mmol/L (ref 3.5–5.1)
Sodium: 137 mmol/L (ref 135–145)
Total Bilirubin: 0.5 mg/dL (ref 0.3–1.2)
Total Protein: 7 g/dL (ref 6.5–8.1)

## 2017-06-19 NOTE — Progress Notes (Signed)
[facility] OFFICE PROGRESS NOTE  Patient Care Team: Leone Haven, MD as PCP - General (Family Medicine) Bary Castilla Forest Gleason, MD as Consulting Physician (General Surgery) Dallas Schimke, MD (Internal Medicine)  Cancer Staging No matching staging information was found for the patient.   Oncology History   # 2009- Sigmoid colon cancer stage II (T3 N0 4 lymph nodes sampled M0 stage II high risk there was obstruction and perforation abscess; Dr.Hern), workup included a low CEA preoperatively and a chest x-ray and CT scan abdomen pelvis negative for metastatic disease, S/P colostomy; s/p FOLFOX x 6 months.   # LATER REVERSED , K RAS  wild type B RAF not evaluated   Also initial iron deficiency with anemia thrombocytosis, high platelets considered reactive, workup includes a normal PCR for CML and a negative [id number] mutation     Cancer of sigmoid ([place])     INTERVAL HISTORY:  Sabrina Wilson 77 y.o.  female pleasant patient above history of Colon cancer history for follow-up.  Patient has not had colonoscopy as recommended. Patient denies abdominal pain. Denies any shortness of breath or cough. Appetite good. No blood in stools or black stools.  REVIEW OF SYSTEMS:  A complete 10 point review of system is done which is negative except mentioned above/history of present illness.   PAST MEDICAL HISTORY :  Past Medical History:  Diagnosis Date  . Arthritis   . Colon cancer [facility]) 2009   T3,N0; s/p resection  Dr. Hulda Humphrey and chemotherapy by Dr. Cynda Acres  . Hernia of flank   . Hypertension   . Neuropathy   . Polyp at cervical os    s/p resection Dr. Sabra Heck    PAST SURGICAL HISTORY :   Past Surgical History:  Procedure Laterality Date  . BREAST BIOPSY Right 05/2014   Dr. Dwyane Luo office-benign  . BREAST SURGERY Right February 2016   Vacuum assisted biopsy for recurrent cyst, fibrocystic changes without evidence of malignancy.  Marland Kitchen CATARACT EXTRACTION W/PHACO Left  01/13/2015   Procedure: CATARACT EXTRACTION PHACO AND INTRAOCULAR LENS PLACEMENT (IOC);  Surgeon: Birder Robson, MD;  Location: [facility] ORS;  Service: Ophthalmology;  Laterality: Left;  [place]  1:02.9 AP   19.2 CDE  12.06 casette lot #  [id number]  . CATARACT EXTRACTION W/PHACO Right 02/03/2015   Procedure: CATARACT EXTRACTION PHACO AND INTRAOCULAR LENS PLACEMENT ([place]);  Surgeon: Birder Robson, MD;  Location: [facility] ORS;  Service: Ophthalmology;  Laterality: Right;  us00:53 ap46.5 cde10.79  . COLECTOMY    . colostomy reversal    . DILATION AND CURETTAGE OF UTERUS  2014   Dr. Sabra Heck, benign per pt  . HERNIA REPAIR  07/13/12   ventral   . TONSILLECTOMY      FAMILY HISTORY :   Family History  Problem Relation Age of Onset  . Stroke Mother   . Diabetes Mother   . Cancer Father        colon  . Cancer Brother        colon  . Cancer Brother        brain    SOCIAL HISTORY:   Social History   Tobacco Use  . Smoking status: Former Smoker    Last attempt to quit: 10/12/2007    Years since quitting: 9.6  . Smokeless tobacco: Never Used  Substance Use Topics  . Alcohol use: Yes    Alcohol/week: 0.6 oz    Types: 1 Standard drinks or equivalent per week    Comment:  with dinner  . Drug use: No    ALLERGIES:  is allergic to sulfa antibiotics.  MEDICATIONS:  Current Outpatient Medications  Medication Sig Dispense Refill  . atorvastatin (LIPITOR) 20 MG tablet TAKE ONE TABLET BY MOUTH DAILY 90 tablet 2  . fluticasone (FLONASE) 50 MCG/ACT nasal spray Place 2 sprays into both nostrils daily. 16 g 6  . losartan (COZAAR) 100 MG tablet TAKE ONE TABLET BY MOUTH DAILY 90 tablet 0  . Omega-3 Fatty Acids (FISH OIL PO) Take by mouth.    . Vitamin D, Ergocalciferol, (DRISDOL) 50000 units CAPS capsule TAKE 1 CAPSULE EVERY 7 DAYS 12 capsule 0   No current facility-administered medications for this visit.     PHYSICAL EXAMINATION: ECOG PERFORMANCE STATUS: 0 - Asymptomatic  BP (!) 168/100 (BP  Location: Left Arm, Patient Position: Sitting)   Pulse 67   Temp 97.9 F (36.6 C) (Tympanic)   Resp 16   Wt 194 lb 12.8 oz (88.4 kg)   BMI 30.51 kg/m   Filed Weights   06/19/17 1447 06/19/17 1449  Weight: 194 lb 9.6 oz (88.3 kg) 194 lb 12.8 oz (88.4 kg)    GENERAL: Well-nourished well-developed; Alert, no distress and comfortable.   alone EYES: no pallor or icterus OROPHARYNX: no thrush or ulceration; good dentition  NECK: supple, no masses felt LYMPH:  no palpable lymphadenopathy in the cervical, axillary or inguinal regions LUNGS: clear to auscultation and  No wheeze or crackles HEART/CVS: regular rate & rhythm and no murmurs; No lower extremity edema ABDOMEN:abdomen soft, non-tender and normal bowel sounds Musculoskeletal:no cyanosis of digits and no clubbing  PSYCH: alert & oriented x 3 with fluent speech NEURO: no focal motor/sensory deficits SKIN:  no rashes or significant lesions  LABORATORY DATA:  I have reviewed the data as listed    Component Value Date/Time   NA 137 06/19/2017 1429   NA 133 (L) 07/15/2012 2105   K 4.5 06/19/2017 1429   K 3.8 07/18/2012 0503   CL 105 06/19/2017 1429   CL 100 07/15/2012 2105   CO2 26 06/19/2017 1429   CO2 28 07/15/2012 2105   GLUCOSE 100 (H) 06/19/2017 1429   GLUCOSE 136 (H) 07/15/2012 2105   BUN 23 (H) 06/19/2017 1429   BUN 10 07/15/2012 2105   CREATININE 1.04 (H) 06/19/2017 1429   CREATININE 0.68 07/15/2012 2105   CALCIUM 9.4 06/19/2017 1429   CALCIUM 8.4 (L) 07/15/2012 2105   PROT 7.0 06/19/2017 1429   PROT 7.5 06/22/2011 1323   ALBUMIN 3.8 06/19/2017 1429   ALBUMIN 3.8 06/22/2011 1323   AST 18 06/19/2017 1429   AST 14 (L) 06/22/2011 1323   ALT 15 06/19/2017 1429   ALT 24 06/22/2011 1323   ALKPHOS 66 06/19/2017 1429   ALKPHOS 72 06/22/2011 1323   BILITOT 0.5 06/19/2017 1429   BILITOT 0.4 06/22/2011 1323   GFRNONAA 52 (L) 06/19/2017 1429   GFRNONAA >60 07/15/2012 2105   GFRAA 60 (L) 06/19/2017 1429   GFRAA >60  07/15/2012 2105    No results found for: SPEP, UPEP  Lab Results  Component Value Date   WBC 6.6 06/19/2017   NEUTROABS 3.8 06/19/2017   HGB 12.0 06/19/2017   HCT 35.9 06/19/2017   MCV 96.0 06/19/2017   PLT 280 06/19/2017      Chemistry      Component Value Date/Time   NA 137 06/19/2017 1429   NA 133 (L) 07/15/2012 2105   K 4.5 06/19/2017 1429   K  3.8 07/18/2012 0503   CL 105 06/19/2017 1429   CL 100 07/15/2012 2105   CO2 26 06/19/2017 1429   CO2 28 07/15/2012 2105   BUN 23 (H) 06/19/2017 1429   BUN 10 07/15/2012 2105   CREATININE 1.04 (H) 06/19/2017 1429   CREATININE 0.68 07/15/2012 2105      Component Value Date/Time   CALCIUM 9.4 06/19/2017 1429   CALCIUM 8.4 (L) 07/15/2012 2105   ALKPHOS 66 06/19/2017 1429   ALKPHOS 72 06/22/2011 1323   AST 18 06/19/2017 1429   AST 14 (L) 06/22/2011 1323   ALT 15 06/19/2017 1429   ALT 24 06/22/2011 1323   BILITOT 0.5 06/19/2017 1429   BILITOT 0.4 06/22/2011 1323       RADIOGRAPHIC STUDIES: I have personally reviewed the radiological images as listed and agreed with the findings in the report. No results found.   ASSESSMENT & PLAN:  Cancer of sigmoid ([place]) # stage II sigmoid colon cancer- [2009] high risk features status post chemotherapy. Clinically no evidence of recurrence.    # Acute bronchitis/mild wheezing/cough- fevers- low grade; recommend OTC medication.  #Given 10 years since diagnosis and stage II colon cancer-likely cured.  However she should continue surveillance for new colon cancer/polyps. Recommend follow-up with Dr. Charissa Bash regarding colonoscopy.  #With regards to follow-up-patient feels comfortable following with PCP; follow-up with [place] only as needed.    Cc; Dr.Sonnenberg.    No orders of the defined types were placed in this encounter.  All questions were answered. The patient knows to call the clinic with any problems, questions or concerns.      Cammie Sickle, MD 06/19/2017 4:39 PM

## 2017-06-19 NOTE — Patient Instructions (Signed)
Robitussin every 8 hours x 4 days.

## 2017-06-19 NOTE — Assessment & Plan Note (Addendum)
#   stage II sigmoid colon cancer- [2009] high risk features status post chemotherapy. Clinically no evidence of recurrence.    # Acute bronchitis/mild wheezing/cough- fevers- low grade; recommend OTC medication.  #Given 10 years since diagnosis and stage II colon cancer-likely cured.  However she should continue surveillance for new colon cancer/polyps. Recommend follow-up with Dr. Charissa Bash regarding colonoscopy.  #With regards to follow-up-patient feels comfortable following with PCP; follow-up with [place] only as needed.    Cc; Dr.Sonnenberg.

## 2017-06-20 LAB — CEA: CEA: 1.2 ng/mL (ref 0.0–4.7)

## 2017-07-24 ENCOUNTER — Ambulatory Visit (INDEPENDENT_AMBULATORY_CARE_PROVIDER_SITE_OTHER): Payer: Medicare Other | Admitting: Family Medicine

## 2017-07-24 ENCOUNTER — Other Ambulatory Visit: Payer: Self-pay

## 2017-07-24 ENCOUNTER — Encounter: Payer: Self-pay | Admitting: Family Medicine

## 2017-07-24 VITALS — BP 100/78 | HR 73 | Temp 98.3°F | Ht 66.5 in | Wt 193.8 lb

## 2017-07-24 DIAGNOSIS — Z85038 Personal history of other malignant neoplasm of large intestine: Secondary | ICD-10-CM

## 2017-07-24 DIAGNOSIS — Z0001 Encounter for general adult medical examination with abnormal findings: Secondary | ICD-10-CM

## 2017-07-24 DIAGNOSIS — Z1329 Encounter for screening for other suspected endocrine disorder: Secondary | ICD-10-CM

## 2017-07-24 DIAGNOSIS — J309 Allergic rhinitis, unspecified: Secondary | ICD-10-CM

## 2017-07-24 DIAGNOSIS — E669 Obesity, unspecified: Secondary | ICD-10-CM

## 2017-07-24 DIAGNOSIS — E785 Hyperlipidemia, unspecified: Secondary | ICD-10-CM | POA: Diagnosis not present

## 2017-07-24 DIAGNOSIS — M722 Plantar fascial fibromatosis: Secondary | ICD-10-CM

## 2017-07-24 DIAGNOSIS — D170 Benign lipomatous neoplasm of skin and subcutaneous tissue of head, face and neck: Secondary | ICD-10-CM | POA: Diagnosis not present

## 2017-07-24 DIAGNOSIS — I10 Essential (primary) hypertension: Secondary | ICD-10-CM

## 2017-07-24 LAB — COMPREHENSIVE METABOLIC PANEL
ALBUMIN: 4 g/dL (ref 3.5–5.2)
ALK PHOS: 57 U/L (ref 39–117)
ALT: 12 U/L (ref 0–35)
AST: 15 U/L (ref 0–37)
BUN: 23 mg/dL (ref 6–23)
CALCIUM: 9.9 mg/dL (ref 8.4–10.5)
CO2: 28 mEq/L (ref 19–32)
Chloride: 105 mEq/L (ref 96–112)
Creatinine, Ser: 0.95 mg/dL (ref 0.40–1.20)
GFR: 61.04 mL/min (ref >60.00)
Glucose, Bld: 125 mg/dL — ABNORMAL HIGH (ref 70–99)
POTASSIUM: 4.6 meq/L (ref 3.5–5.1)
Sodium: 141 mEq/L (ref 135–145)
TOTAL PROTEIN: 7.1 g/dL (ref 6.0–8.3)
Total Bilirubin: 0.5 mg/dL (ref 0.2–1.2)

## 2017-07-24 LAB — TSH: TSH: 3.68 u[IU]/mL (ref 0.35–4.50)

## 2017-07-24 LAB — LIPID PANEL
CHOLESTEROL: 181 mg/dL (ref 0–200)
HDL: 100.8 mg/dL (ref >39.00)
LDL Cholesterol: 68 mg/dL (ref 0–99)
NonHDL: 80.48
Total CHOL/HDL Ratio: 2
Triglycerides: 63 mg/dL (ref 0.0–149.0)
VLDL: 12.6 mg/dL (ref 0.0–40.0)

## 2017-07-24 LAB — HEMOGLOBIN A1C: HEMOGLOBIN A1C: 6.2 % (ref 4.6–6.5)

## 2017-07-24 MED ORDER — ZOSTER VAC RECOMB ADJUVANTED 50 MCG/0.5ML IM SUSR: 0.5000 mL | Freq: Once | INTRAMUSCULAR | 0 refills | Status: AC

## 2017-07-24 MED ORDER — AZELASTINE HCL 0.1 % NA SOLN: 2.0000 | Freq: Two times a day (BID) | NASAL | 12 refills | Status: DC

## 2017-07-24 NOTE — Patient Instructions (Signed)
Nice to see you. Please try to start working on exercise. We will get you set up with Dr. Bary Castilla for your colonoscopy. Please try the Astelin nose spray for your allergy symptoms. Please use the frozen water bottle and stretches for your plantar fasciitis.  If this is not improving over the next 1-2 months please let us know and we can refer you to podiatry. Please go to your pharmacy to get the shingrix vaccine.

## 2017-07-24 NOTE — Progress Notes (Signed)
Sabrina Rumps, MD Phone: [phone]  Sabrina Wilson is a 77 y.o. female who presents today for cpe.  Not exercising much. Diet is described as regular.  Eats lots of salads with meats and vegetables.  No soda or sweet tea. Flu vaccination, tetanus vaccination, and pneumonia vaccination up-to-date. She is due for colonoscopy.  She would prefer to see Dr. Bary Castilla.  She does have a history of colon cancer diagnosed and treated 10 years ago. Mammogram up-to-date and negative. DEXA scan up-to-date. No tobacco use or illicit drug use. 1-2 alcoholic beverages daily.  Patient notes she was advised she has a lipoma in her right mid trapezius.  It has not grown.  It does not hurt her.  She reports chronic issues with postnasal drip and rhinorrhea and some cough during the winter.  She is tried [employer] cabin [employer] with little benefit.  Typically improves once the weather improves.  She reports heel pain in her right heel for the last 3 weeks.  No injury.  Notes slight discomfort all the time though it is worse after she has been seated or laying down for some period of time and then gets up and walks.  She has been rubbing and scratching it.  Active Ambulatory Problems    Diagnosis Date Noted  . Hypertension 12/18/2010  . Personal history of colon cancer 11/11/2011  . Ventral hernia 07/26/2012  . Medicare annual wellness visit, subsequent 03/25/2013  . Postmenopausal estrogen deficiency 03/25/2013  . Lipoma of neck 09/27/2013  . Breast cyst 04/14/2014  . Encounter for general adult medical examination with abnormal findings 04/06/2016  . Multiple nevi 04/06/2016  . Breast tenderness in female 04/06/2016  . Right lateral epicondylitis 04/06/2016  . Allergic rhinitis 04/06/2016  . Clear vaginal discharge 04/06/2016  . Neuropathy 04/06/2016  . Hyperlipidemia 05/02/2016  . Prediabetes 05/02/2016  . Cancer of sigmoid ([place]) 06/17/2016  . Plantar fasciitis of right foot 07/25/2017    Resolved Ambulatory Problems    Diagnosis Date Noted  . Medicare annual wellness visit, initial 04/12/2012  . Abscess, trunk 04/12/2012  . Viral URI 09/30/2014   Past Medical History:  Diagnosis Date  . Arthritis   . Colon cancer ([place]) 2009  . Hernia of flank   . Hypertension   . Neuropathy   . Polyp at cervical os     Family History  Problem Relation Age of Onset  . Stroke Mother   . Diabetes Mother   . Cancer Father        colon  . Cancer Brother        colon  . Cancer Brother        brain    Social History   Socioeconomic History  . Marital status: Married    Spouse name: Not on file  . Number of children: Not on file  . Years of education: Not on file  . Highest education level: Not on file  Occupational History  . Not on file  Social Needs  . Financial resource strain: Not on file  . Food insecurity:    Worry: Not on file    Inability: Not on file  . Transportation needs:    Medical: Not on file    Non-medical: Not on file  Tobacco Use  . Smoking status: Former Smoker    Last attempt to quit: 10/12/2007    Years since quitting: 9.7  . Smokeless tobacco: Never Used  Substance and Sexual Activity  . Alcohol use: Yes  Alcohol/week: 0.6 oz    Types: 1 Standard drinks or equivalent per week    Comment: with dinner  . Drug use: No  . Sexual activity: Not on file  Lifestyle  . Physical activity:    Days per week: Not on file    Minutes per session: Not on file  . Stress: Not on file  Relationships  . Social connections:    Talks on phone: Not on file    Gets together: Not on file    Attends religious service: Not on file    Active member of club or organization: Not on file    Attends meetings of clubs or organizations: Not on file    Relationship status: Not on file  . Intimate partner violence:    Fear of current or ex partner: Not on file    Emotionally abused: Not on file    Physically abused: Not on file    Forced sexual activity: Not  on file  Other Topics Concern  . Not on file  Social History Narrative   Lives in [place] with husband. 2 children, son lives nearby.      Diet - regular      Exercise - none    ROS  General:  Negative for nexplained weight loss, fever Skin: Negative for new or changing mole, sore that won't heal HEENT: Negative for trouble hearing, trouble seeing, ringing in ears, mouth sores, hoarseness, change in voice, dysphagia. CV:  Negative for chest pain, dyspnea, edema, palpitations Resp: Negative for cough, dyspnea, hemoptysis GI: Negative for nausea, vomiting, diarrhea, constipation, abdominal pain, melena, hematochezia. GU: Negative for dysuria, incontinence, urinary hesitance, hematuria, vaginal or penile discharge, polyuria, sexual difficulty, lumps in testicle or breasts MSK: Negative for muscle cramps or aches, positive for a joint pain or swelling Neuro: Negative for headaches, weakness, numbness, dizziness, passing out/fainting Psych: Negative for depression, anxiety, memory problems  Objective  Physical Exam Vitals:   07/24/17 0839  BP: 100/78  Pulse: 73  Temp: 98.3 F (36.8 C)  SpO2: 98%    BP Readings from Last 3 Encounters:  07/24/17 100/78  06/19/17 (!) 168/100  07/12/16 120/84   Wt Readings from Last 3 Encounters:  07/24/17 193 lb 12.8 oz (87.9 kg)  06/19/17 194 lb 12.8 oz (88.4 kg)  07/12/16 190 lb 3.2 oz (86.3 kg)    Physical Exam  Constitutional: No distress.  HENT:  Head: Normocephalic and atraumatic.  Mouth/Throat: Oropharynx is clear and moist. No oropharyngeal exudate.  Eyes: Pupils are equal, round, and reactive to light. Conjunctivae are normal.  Cardiovascular: Normal rate, regular rhythm and normal heart sounds.  Pulmonary/Chest: Effort normal and breath sounds normal.  Abdominal: Soft. Bowel sounds are normal. She exhibits no distension. There is no tenderness.  Genitourinary:  Genitourinary Comments: Pelvic exam not indicated given age  and postmenopausal status, patient declined breast exam  Musculoskeletal: She exhibits no edema.  Right heel at the insertion point of the plantar fascia is tender to palpation, 2+ DP pulses bilaterally, no other tenderness in either foot  Neurological: She is alert. Gait normal.  Skin: Skin is warm and dry. She is not diaphoretic.  Psychiatric: Mood and affect normal.  Small fatty tumor noted at the right mid trapezius that is nontender   Assessment/Plan:   Encounter for general adult medical examination with abnormal findings Physical exam completed.  Encouraged exercise as she is able.  She will monitor her diet.  Refer back to general surgery for colonoscopy  for history of colon cancer.  Mammogram up-to-date.  Lab work as outlined below.  Shingrix vaccine prescribed.  Personal history of colon cancer Refer back to Dr. Bary Castilla for colonoscopy.  Lipoma of neck Lesion consistent with lipoma.  She opted to monitor as opposed to ultrasound.  If changes she will let us know and could proceed with ultrasound  Plantar fasciitis of right foot Treating with ice consistently for 5-10 minutes 3 times a day as well as stretching.  If not improving would refer to podiatry.  Allergic rhinitis We will trial Astelin nasal spray.   Orders Placed This Encounter  Procedures  . Comp Met (CMET)  . Lipid panel  . HgB A1c  . TSH  . Ambulatory referral to General Surgery    Referral Priority:   Routine    Referral Type:   Surgical    Referral Reason:   Specialty Services Required    Requested Specialty:   General Surgery    Number of Visits Requested:   1    Meds ordered this encounter  Medications  . azelastine (ASTELIN) 0.1 % nasal spray    Sig: Place 2 sprays into both nostrils 2 (two) times daily. Use in each nostril as directed    Dispense:  30 mL    Refill:  12  . Zoster Vaccine Adjuvanted [facility]) injection    Sig: Inject 0.5 mLs into the muscle once for 1 dose.    Dispense:  0.5  mL    Refill:  0     Sabrina Rumps, MD [place]

## 2017-07-25 DIAGNOSIS — M722 Plantar fascial fibromatosis: Secondary | ICD-10-CM | POA: Insufficient documentation

## 2017-07-25 NOTE — Assessment & Plan Note (Signed)
We will trial Astelin nasal spray.

## 2017-07-25 NOTE — Assessment & Plan Note (Signed)
Refer back to Dr. Bary Castilla for colonoscopy.

## 2017-07-25 NOTE — Assessment & Plan Note (Signed)
Lesion consistent with lipoma.  She opted to monitor as opposed to ultrasound.  If changes she will let us know and could proceed with ultrasound

## 2017-07-25 NOTE — Assessment & Plan Note (Signed)
Treating with ice consistently for 5-10 minutes 3 times a day as well as stretching.  If not improving would refer to podiatry.

## 2017-07-25 NOTE — Assessment & Plan Note (Signed)
Physical exam completed.  Encouraged exercise as she is able.  She will monitor her diet.  Refer back to general surgery for colonoscopy for history of colon cancer.  Mammogram up-to-date.  Lab work as outlined below.  Shingrix vaccine prescribed.

## 2017-07-27 ENCOUNTER — Other Ambulatory Visit: Payer: Self-pay | Admitting: Family Medicine

## 2017-07-27 DIAGNOSIS — E785 Hyperlipidemia, unspecified: Secondary | ICD-10-CM

## 2017-07-27 MED ORDER — ATORVASTATIN CALCIUM 10 MG PO TABS: 10.0000 mg | ORAL_TABLET | Freq: Every day | ORAL | 3 refills | Status: DC

## 2017-07-28 ENCOUNTER — Other Ambulatory Visit: Payer: Self-pay | Admitting: Family Medicine

## 2017-08-16 ENCOUNTER — Encounter: Payer: Self-pay | Admitting: Family

## 2017-08-16 ENCOUNTER — Ambulatory Visit: Payer: Medicare Other | Admitting: Family

## 2017-08-16 VITALS — BP 130/86 | HR 69 | Temp 99.0°F | Wt 194.2 lb

## 2017-08-16 DIAGNOSIS — H9201 Otalgia, right ear: Secondary | ICD-10-CM

## 2017-08-16 MED ORDER — AMOXICILLIN 500 MG PO CAPS: 500.0000 mg | ORAL_CAPSULE | Freq: Two times a day (BID) | ORAL | 0 refills | Status: AC

## 2017-08-16 NOTE — Patient Instructions (Signed)
Glad your ear pain has improved  Start amoxicillin as discussed if doesn't continue to do so  Mucinex- with plenty of water  Probiotics  Let me know if not better

## 2017-08-16 NOTE — Progress Notes (Signed)
Subjective:    Patient ID: Sabrina Wilson, female    DOB: Jun 16, 1940, 77 y.o.   MRN: [id number]  CC: Sabrina Wilson is a 77 y.o. female who presents today for an acute visit.    HPI: CC: right ear pain 10 days, improved Endorses congestion. Ibuprofen with relief. No hearing changes, ear drainage.  Has seasonal allergies.   No fever, chills., sinus pressure, cough.     HISTORY:  Past Medical History:  Diagnosis Date  . Arthritis   . Colon cancer [facility]) 2009   T3,N0; s/p resection  Dr. Hulda Humphrey and chemotherapy by Dr. Cynda Acres  . Hernia of flank   . Hypertension   . Neuropathy   . Polyp at cervical os    s/p resection Dr. Sabra Heck   Past Surgical History:  Procedure Laterality Date  . BREAST BIOPSY Right 05/2014   Dr. Dwyane Luo office-benign  . BREAST SURGERY Right February 2016   Vacuum assisted biopsy for recurrent cyst, fibrocystic changes without evidence of malignancy.  Marland Kitchen CATARACT EXTRACTION W/PHACO Left 01/13/2015   Procedure: CATARACT EXTRACTION PHACO AND INTRAOCULAR LENS PLACEMENT (IOC);  Surgeon: Birder Robson, MD;  Location: [facility] ORS;  Service: Ophthalmology;  Laterality: Left;  [place]  1:02.9 AP   19.2 CDE  12.06 casette lot #  [id number]  . CATARACT EXTRACTION W/PHACO Right 02/03/2015   Procedure: CATARACT EXTRACTION PHACO AND INTRAOCULAR LENS PLACEMENT ([place]);  Surgeon: Birder Robson, MD;  Location: [facility] ORS;  Service: Ophthalmology;  Laterality: Right;  us00:53 ap46.5 cde10.79  . COLECTOMY    . colostomy reversal    . DILATION AND CURETTAGE OF UTERUS  2014   Dr. Sabra Heck, benign per pt  . HERNIA REPAIR  07/13/12   ventral   . TONSILLECTOMY     Family History  Problem Relation Age of Onset  . Stroke Mother   . Diabetes Mother   . Cancer Father        colon  . Cancer Brother        colon  . Cancer Brother        brain    Allergies: Sulfa antibiotics Current Outpatient Medications on File Prior to Visit  Medication Sig Dispense Refill  . atorvastatin  (LIPITOR) 10 MG tablet Take 1 tablet (10 mg total) by mouth daily. 90 tablet 3  . azelastine (ASTELIN) 0.1 % nasal spray Place 2 sprays into both nostrils 2 (two) times daily. Use in each nostril as directed 30 mL 12  . fluticasone (FLONASE) 50 MCG/ACT nasal spray Place 2 sprays into both nostrils daily. 16 g 6  . losartan (COZAAR) 100 MG tablet TAKE ONE TABLET BY MOUTH DAILY 90 tablet 0  . losartan (COZAAR) 100 MG tablet TAKE ONE TABLET BY MOUTH DAILY 90 tablet 0  . Omega-3 Fatty Acids (FISH OIL PO) Take by mouth.     No current facility-administered medications on file prior to visit.     Social History   Tobacco Use  . Smoking status: Former Smoker    Last attempt to quit: 10/12/2007    Years since quitting: 9.8  . Smokeless tobacco: Never Used  Substance Use Topics  . Alcohol use: Yes    Alcohol/week: 0.6 oz    Types: 1 Standard drinks or equivalent per week    Comment: with dinner  . Drug use: No    Review of Systems  Constitutional: Negative for chills and fever.  HENT: Positive for congestion and ear pain. Negative for ear discharge, facial swelling,  postnasal drip, sinus pressure and sore throat.   Respiratory: Negative for cough, shortness of breath and wheezing.   Cardiovascular: Negative for chest pain and palpitations.  Gastrointestinal: Negative for nausea and vomiting.      Objective:    BP 130/86 (BP Location: Left Arm, Patient Position: Sitting, Cuff Size: Large)   Pulse 69   Temp 99 F (37.2 C) (Oral)   Wt 194 lb 4 oz (88.1 kg)   SpO2 96%   BMI 30.88 kg/m    Physical Exam  Constitutional: She appears well-developed and well-nourished.  HENT:  Head: Normocephalic and atraumatic.  Right Ear: Hearing, external ear and ear canal normal. No drainage, swelling or tenderness. No foreign bodies. Tympanic membrane is bulging. Tympanic membrane is not erythematous. No middle ear effusion. No decreased hearing is noted.  Left Ear: Hearing, tympanic membrane,  external ear and ear canal normal. No drainage, swelling or tenderness. No foreign bodies. Tympanic membrane is not erythematous and not bulging.  No middle ear effusion. No decreased hearing is noted.  Nose: Nose normal. No rhinorrhea. Right sinus exhibits no maxillary sinus tenderness and no frontal sinus tenderness. Left sinus exhibits no maxillary sinus tenderness and no frontal sinus tenderness.  Mouth/Throat: Uvula is midline, oropharynx is clear and moist and mucous membranes are normal. No oropharyngeal exudate, posterior oropharyngeal edema, posterior oropharyngeal erythema or tonsillar abscesses.  Slight bulging of right tm  Eyes: Conjunctivae are normal.  Cardiovascular: Regular rhythm, normal heart sounds and normal pulses.  Pulmonary/Chest: Effort normal and breath sounds normal. She has no wheezes. She has no rhonchi. She has no rales.  Lymphadenopathy:       Head (right side): No submental, no submandibular, no tonsillar, no preauricular, no posterior auricular and no occipital adenopathy present.       Head (left side): No submental, no submandibular, no tonsillar, no preauricular, no posterior auricular and no occipital adenopathy present.    She has no cervical adenopathy.  Neurological: She is alert.  Skin: Skin is warm and dry.  Psychiatric: She has a normal mood and affect. Her speech is normal and behavior is normal. Thought content normal.  Vitals reviewed.      Assessment & Plan:   1. Right ear pain Improved. Afebrile and well appearing. Advised mucinex. If no continued improvement, I have given patient a printed rx for amoxicillin to fill. Patient will let me know if no improvement.   - amoxicillin (AMOXIL) 500 MG capsule; Take 1 capsule (500 mg total) by mouth 2 (two) times daily for 5 days.  Dispense: 10 capsule; Refill: 0    I am having Hassan Rowan K. Matto maintain her Omega-3 Fatty Acids (FISH OIL PO), fluticasone, losartan, azelastine, atorvastatin, and  losartan.   No orders of the defined types were placed in this encounter.   Return precautions given.   Risks, benefits, and alternatives of the medications and treatment plan prescribed today were discussed, and patient expressed understanding.   Education regarding symptom management and diagnosis given to patient on AVS.  Continue to follow with Leone Haven, MD for routine health maintenance.   Lewanda Rife and I agreed with plan.   Mable Paris, FNP

## 2017-08-22 ENCOUNTER — Encounter: Payer: Self-pay | Admitting: General Surgery

## 2017-08-22 ENCOUNTER — Ambulatory Visit: Payer: Medicare Other | Admitting: General Surgery

## 2017-08-22 VITALS — BP 108/60 | HR 67 | Resp 11 | Ht 66.5 in | Wt 192.0 lb

## 2017-08-22 DIAGNOSIS — Z860100 Personal history of colon polyps, unspecified: Secondary | ICD-10-CM

## 2017-08-22 DIAGNOSIS — C187 Malignant neoplasm of sigmoid colon: Secondary | ICD-10-CM | POA: Diagnosis not present

## 2017-08-22 MED ORDER — POLYETHYLENE GLYCOL 3350 17 GM/SCOOP PO POWD: 1.0000 | Freq: Once | ORAL | 0 refills | Status: AC

## 2017-08-22 NOTE — Progress Notes (Signed)
Patient ID: Sabrina Wilson, female   DOB: 03/02/1941, 77 y.o.   MRN: [id number]  Chief Complaint  Patient presents with  . Follow-up    HPI Sabrina Wilson is a 77 y.o. female.  Who presents for a colonoscopy discussion. The last colonoscopy was completed in 2015. History colon cancer in 2009. Denies any gastrointestinal issues. Bowels move regular and no bleeding noted.  HPI  Past Medical History:  Diagnosis Date  . Arthritis   . Colon cancer [facility]) 2009   T3,N0; s/p resection  Dr. Hulda Humphrey and chemotherapy by Dr. Cynda Acres  . Hernia of flank   . Hypertension   . Neuropathy   . Polyp at cervical os    s/p resection Dr. Sabra Heck    Past Surgical History:  Procedure Laterality Date  . BREAST BIOPSY Right 05/2014   Dr. Dwyane Luo office-benign  . BREAST SURGERY Right February 2016   Vacuum assisted biopsy for recurrent cyst, fibrocystic changes without evidence of malignancy.  Marland Kitchen CATARACT EXTRACTION W/PHACO Left 01/13/2015   Procedure: CATARACT EXTRACTION PHACO AND INTRAOCULAR LENS PLACEMENT (IOC);  Surgeon: Birder Robson, MD;  Location: [facility] ORS;  Service: Ophthalmology;  Laterality: Left;  [place]  1:02.9 AP   19.2 CDE  12.06 casette lot #  [id number]  . CATARACT EXTRACTION W/PHACO Right 02/03/2015   Procedure: CATARACT EXTRACTION PHACO AND INTRAOCULAR LENS PLACEMENT ([place]);  Surgeon: Birder Robson, MD;  Location: [facility] ORS;  Service: Ophthalmology;  Laterality: Right;  us00:53 ap46.5 cde10.79  . COLECTOMY    . COLONOSCOPY  2015   Dr Candace Cruise  . colostomy reversal    . DILATION AND CURETTAGE OF UTERUS  2014   Dr. Sabra Heck, benign per pt  . HERNIA REPAIR  07/13/12   ventral   . TONSILLECTOMY      Family History  Problem Relation Age of Onset  . Stroke Mother   . Diabetes Mother   . Cancer Father        colon  . Cancer Brother        colon  . Cancer Brother        brain    Social History Social History   Tobacco Use  . Smoking status: Former Smoker    Last attempt to quit:  10/12/2007    Years since quitting: 9.8  . Smokeless tobacco: Never Used  Substance Use Topics  . Alcohol use: Yes    Alcohol/week: 0.6 oz    Types: 1 Standard drinks or equivalent per week    Comment: with dinner  . Drug use: No    Allergies  Allergen Reactions  . Sulfa Antibiotics Hives    Current Outpatient Medications  Medication Sig Dispense Refill  . atorvastatin (LIPITOR) 10 MG tablet Take 1 tablet (10 mg total) by mouth daily. 90 tablet 3  . azelastine (ASTELIN) 0.1 % nasal spray Place 2 sprays into both nostrils 2 (two) times daily. Use in each nostril as directed 30 mL 12  . losartan (COZAAR) 100 MG tablet TAKE ONE TABLET BY MOUTH DAILY 90 tablet 0  . Omega-3 Fatty Acids (FISH OIL PO) Take by mouth.    . fluticasone (FLONASE) 50 MCG/ACT nasal spray Place 2 sprays into both nostrils daily. (Patient not taking: Reported on 08/22/2017) 16 g 6   No current facility-administered medications for this visit.     Review of Systems Review of Systems  Constitutional: Negative.   Respiratory: Negative.   Cardiovascular: Negative.   Gastrointestinal: Negative for constipation and diarrhea.  Blood pressure 108/60, pulse 67, resp. rate 11, height 5' 6.5" (1.689 m), weight 192 lb (87.1 kg), SpO2 97 %.  Physical Exam Physical Exam  Constitutional: She is oriented to person, place, and time. She appears well-developed and well-nourished.  Cardiovascular: Normal rate, regular rhythm and normal heart sounds.  Pulmonary/Chest: Effort normal and breath sounds normal.  Abdominal:    Neurological: She is alert and oriented to person, place, and time.  Skin: Skin is warm and dry.    Data Reviewed March 06, 2014 colonoscopy biopsy results:  Procedure completed by Verdie Shire, M.D.   A. COLON, CECUM, POLYP; COLD BIOPSY:  -VILLOUS ADENOMA  -NEGATIVE FOR HIGH-GRADE DYSPLASIA AND MALIGNANCY.    0.2 cm B. SIGMOID COLON POLYP; COLD SNARE:  -VILLOUS ADENOMA.  -NEGATIVE FOR  HIGH-GRADE DYSPLASIA AND MALIGNANCY.    0.4 cm C. RECTAL POLYP; COLD SNARE:  -HYPERPLASTIC POLYP.  -NEGATIVE FOR DYSPLASIA AND MALIGNANCY.   Comprehensive metabolic panel of July 24, 2017 notable for elevated blood sugar 125.  Normal electrolytes.  Normal renal function.  Estimated GFR 61. CBC of June 19, 2017 notable for hemoglobin of 12.0 with an MCV of 96, white blood cell count of 6600.  Platelet count of 280,000.  Assessment    Candidate for follow-up colonoscopy based on identification of villous adenomas.    Plan    Colonoscopy with possible biopsy/polypectomy prn: Information regarding the procedure, including its potential risks and complications (including but not limited to perforation of the bowel, which may require emergency surgery to repair, and bleeding) was verbally given to the patient. Educational information regarding lower intestinal endoscopy was given to the patient. Written instructions for how to complete the bowel prep using Miralax were provided. The importance of drinking ample fluids to avoid dehydration as a result of the prep emphasized.  HPI, Physical Exam, Assessment and Plan have been scribed under the direction and in the presence of Robert Bellow, MD. Karie Fetch, RN  I have completed the exam and reviewed the above documentation for accuracy and completeness.  I agree with the above.  Haematologist has been used and any errors in dictation or transcription are unintentional.  Hervey Ard, M.D., F.A.C.S.  The patient is scheduled for a Colonoscopy at [facility] on 09/13/17. They are aware to call the day before to get their arrival time. She will stop her Fish Oil one week prior. Miralax prescription has been sent into the patient's pharmacy. The patient is aware of date and instructions.   Forest Gleason Han [place] 08/23/2017, 5:11 AM

## 2017-08-22 NOTE — Patient Instructions (Addendum)
Colonoscopy, Adult A colonoscopy is an exam to look at the entire large intestine. During the exam, a lubricated, bendable tube is inserted into the anus and then passed into the rectum, colon, and other parts of the large intestine. A colonoscopy is often done as a part of normal colorectal screening or in response to certain symptoms, such as anemia, persistent diarrhea, abdominal pain, and blood in the stool. The exam can help screen for and diagnose medical problems, including:  Tumors.  Polyps.  Inflammation.  Areas of bleeding.  Tell a health care provider about:  Any allergies you have.  All medicines you are taking, including vitamins, herbs, eye drops, creams, and over-the-counter medicines.  Any problems you or family members have had with anesthetic medicines.  Any blood disorders you have.  Any surgeries you have had.  Any medical conditions you have.  Any problems you have had passing stool. What are the risks? Generally, this is a safe procedure. However, problems may occur, including:  Bleeding.  A tear in the intestine.  A reaction to medicines given during the exam.  Infection (rare).  What happens before the procedure? Eating and drinking restrictions Follow instructions from your health care provider about eating and drinking, which may include:  A few days before the procedure - follow a low-fiber diet. Avoid nuts, seeds, dried fruit, raw fruits, and vegetables.  1-3 days before the procedure - follow a clear liquid diet. Drink only clear liquids, such as clear broth or bouillon, black coffee or tea, clear juice, clear soft drinks or sports drinks, gelatin dessert, and popsicles. Avoid any liquids that contain red or purple dye.  On the day of the procedure - do not eat or drink anything during the 2 hours before the procedure, or within the time period that your health care provider recommends.  Bowel prep If you were prescribed an oral bowel prep  to clean out your colon:  Take it as told by your health care provider. Starting the day before your procedure, you will need to drink a large amount of medicated liquid. The liquid will cause you to have multiple loose stools until your stool is almost clear or light green.  If your skin or anus gets irritated from diarrhea, you may use these to relieve the irritation: ? Medicated wipes, such as adult wet wipes with aloe and vitamin E. ? A skin soothing-product like petroleum jelly.  If you vomit while drinking the bowel prep, take a break for up to 60 minutes and then begin the bowel prep again. If vomiting continues and you cannot take the bowel prep without vomiting, call your health care provider.  General instructions  Ask your health care provider about changing or stopping your regular medicines. This is especially important if you are taking diabetes medicines or blood thinners.  Plan to have someone take you home from the hospital or clinic. What happens during the procedure?  An IV tube may be inserted into one of your veins.  You will be given medicine to help you relax (sedative).  To reduce your risk of infection: ? Your health care team will wash or sanitize their hands. ? Your anal area will be washed with soap.  You will be asked to lie on your side with your knees bent.  Your health care provider will lubricate a long, thin, flexible tube. The tube will have a camera and a light on the end.  The tube will be inserted into your   anus.  The tube will be gently eased through your rectum and colon.  Air will be delivered into your colon to keep it open. You may feel some pressure or cramping.  The camera will be used to take images during the procedure.  A small tissue sample may be removed from your body to be examined under a microscope (biopsy). If any potential problems are found, the tissue will be sent to a lab for testing.  If small polyps are found, your  health care provider may remove them and have them checked for cancer cells.  The tube that was inserted into your anus will be slowly removed. The procedure may vary among health care providers and hospitals. What happens after the procedure?  Your blood pressure, heart rate, breathing rate, and blood oxygen level will be monitored until the medicines you were given have worn off.  Do not drive for 24 hours after the exam.  You may have a small amount of blood in your stool.  You may pass gas and have mild abdominal cramping or bloating due to the air that was used to inflate your colon during the exam.  It is up to you to get the results of your procedure. Ask your health care provider, or the department performing the procedure, when your results will be ready. This information is not intended to replace advice given to you by your health care provider. Make sure you discuss any questions you have with your health care provider. Document Released: 04/08/2000 Document Revised: 02/10/2016 Document Reviewed: 06/23/2015 Elsevier Interactive Patient Education  2018 Elsevier Inc.  

## 2017-08-23 DIAGNOSIS — Z860100 Personal history of colon polyps, unspecified: Secondary | ICD-10-CM | POA: Insufficient documentation

## 2017-09-07 ENCOUNTER — Other Ambulatory Visit: Payer: Self-pay | Admitting: Family Medicine

## 2017-09-08 ENCOUNTER — Other Ambulatory Visit (INDEPENDENT_AMBULATORY_CARE_PROVIDER_SITE_OTHER): Payer: Medicare Other

## 2017-09-08 DIAGNOSIS — E785 Hyperlipidemia, unspecified: Secondary | ICD-10-CM

## 2017-09-08 LAB — LDL CHOLESTEROL, DIRECT: Direct LDL: 66 mg/dL

## 2017-09-12 ENCOUNTER — Encounter: Payer: Self-pay | Admitting: *Deleted

## 2017-09-13 ENCOUNTER — Ambulatory Visit: Payer: Medicare Other | Admitting: Registered Nurse

## 2017-09-13 ENCOUNTER — Encounter: Payer: Self-pay | Admitting: *Deleted

## 2017-09-13 ENCOUNTER — Encounter
Admission: RE | Disposition: A | Discharge status: Home / Self Care | Payer: Self-pay | Source: Ambulatory Visit | Attending: General Surgery

## 2017-09-13 ENCOUNTER — Ambulatory Visit
Admission: RE | Admit: 2017-09-13 | Discharge: 2017-09-13 | Disposition: A | Discharge status: Home / Self Care | Payer: Medicare Other | Source: Ambulatory Visit | Attending: General Surgery | Admitting: General Surgery

## 2017-09-13 ENCOUNTER — Other Ambulatory Visit: Payer: Self-pay

## 2017-09-13 DIAGNOSIS — Z9221 Personal history of antineoplastic chemotherapy: Secondary | ICD-10-CM | POA: Insufficient documentation

## 2017-09-13 DIAGNOSIS — Z800 Family history of malignant neoplasm of digestive organs: Secondary | ICD-10-CM | POA: Diagnosis not present

## 2017-09-13 DIAGNOSIS — Z86010 Personal history of colonic polyps: Secondary | ICD-10-CM | POA: Diagnosis not present

## 2017-09-13 DIAGNOSIS — G629 Polyneuropathy, unspecified: Secondary | ICD-10-CM | POA: Diagnosis not present

## 2017-09-13 DIAGNOSIS — Z85038 Personal history of other malignant neoplasm of large intestine: Secondary | ICD-10-CM | POA: Insufficient documentation

## 2017-09-13 DIAGNOSIS — Z9049 Acquired absence of other specified parts of digestive tract: Secondary | ICD-10-CM | POA: Diagnosis not present

## 2017-09-13 DIAGNOSIS — Z1211 Encounter for screening for malignant neoplasm of colon: Secondary | ICD-10-CM | POA: Diagnosis not present

## 2017-09-13 DIAGNOSIS — Q438 Other specified congenital malformations of intestine: Secondary | ICD-10-CM | POA: Insufficient documentation

## 2017-09-13 DIAGNOSIS — Z87891 Personal history of nicotine dependence: Secondary | ICD-10-CM | POA: Diagnosis not present

## 2017-09-13 DIAGNOSIS — Z7951 Long term (current) use of inhaled steroids: Secondary | ICD-10-CM | POA: Diagnosis not present

## 2017-09-13 DIAGNOSIS — Z79899 Other long term (current) drug therapy: Secondary | ICD-10-CM | POA: Insufficient documentation

## 2017-09-13 DIAGNOSIS — I10 Essential (primary) hypertension: Secondary | ICD-10-CM | POA: Diagnosis not present

## 2017-09-13 HISTORY — PX: COLONOSCOPY WITH PROPOFOL: SHX5780

## 2017-09-13 SURGERY — COLONOSCOPY WITH PROPOFOL
Anesthesia: General

## 2017-09-13 MED ORDER — LIDOCAINE HCL (CARDIAC) PF 100 MG/5ML IV SOSY
PREFILLED_SYRINGE | INTRAVENOUS | Status: DC | PRN
  Administered 2017-09-13: 40 mg via INTRAVENOUS

## 2017-09-13 MED ORDER — PROPOFOL 10 MG/ML IV BOLUS
INTRAVENOUS | Status: AC
  Filled 2017-09-13: qty 20

## 2017-09-13 MED ORDER — PROPOFOL 500 MG/50ML IV EMUL
INTRAVENOUS | Status: AC
  Filled 2017-09-13: qty 50

## 2017-09-13 MED ORDER — EPHEDRINE SULFATE 50 MG/ML IJ SOLN
INTRAMUSCULAR | Status: DC | PRN
  Administered 2017-09-13: 5 mg via INTRAVENOUS

## 2017-09-13 MED ORDER — SODIUM CHLORIDE 0.9 % IV SOLN
INTRAVENOUS | Status: DC
  Administered 2017-09-13: 09:00:00 via INTRAVENOUS

## 2017-09-13 MED ORDER — PROPOFOL 10 MG/ML IV BOLUS
INTRAVENOUS | Status: DC | PRN
  Administered 2017-09-13: 60 mg via INTRAVENOUS

## 2017-09-13 MED ORDER — PHENYLEPHRINE HCL 10 MG/ML IJ SOLN
INTRAMUSCULAR | Status: DC | PRN
  Administered 2017-09-13 (×2): 100 ug via INTRAVENOUS

## 2017-09-13 MED ORDER — PROPOFOL 500 MG/50ML IV EMUL
INTRAVENOUS | Status: DC | PRN
  Administered 2017-09-13: 150 ug/kg/min via INTRAVENOUS

## 2017-09-13 MED ORDER — LIDOCAINE HCL (PF) 2 % IJ SOLN
INTRAMUSCULAR | Status: AC
  Filled 2017-09-13: qty 10

## 2017-09-13 NOTE — Op Note (Signed)
[facility] Gastroenterology Patient Name: Sabrina Wilson Procedure Date: 09/13/2017 8:56 AM MRN: [id number] Account #: [id number] Date of Birth: May 26, 1940 Admit Type: Outpatient Age: 77 Room: [facility] ENDO ROOM 1 Gender: Female Note Status: Finalized Procedure:            Colonoscopy Indications:          High risk colon cancer surveillance: Personal history                        of colon cancer Providers:            Robert Bellow, MD Referring MD:         Angela Adam. Caryl Bis (Referring MD) Medicines:            Monitored Anesthesia Care Complications:        No immediate complications. Procedure:            Pre-Anesthesia Assessment:                       - Prior to the procedure, a History and Physical was                        performed, and patient medications, allergies and                        sensitivities were reviewed. The patient's tolerance of                        previous anesthesia was reviewed.                       - The risks and benefits of the procedure and the                        sedation options and risks were discussed with the                        patient. All questions were answered and informed                        consent was obtained.                       After obtaining informed consent, the colonoscope was                        passed under direct vision. Throughout the procedure,                        the patient's blood pressure, pulse, and oxygen                        saturations were monitored continuously. The                        Colonoscope was introduced through the anus and                        advanced to the the hepatic flexure. The colonoscopy  was technically difficult and complex due to poor bowel                        prep, a redundant colon and significant looping.                        Successful completion of the procedure was aided by                        changing the  patient to a prone position. The                        colonoscopy was performed without difficulty. The                        patient tolerated the procedure well. The quality of                        the bowel preparation was unsatisfactory.                       In spite of a previous colon resection, the colon was                        tortuous and had looping in both the sigmoid and                        transverse colon. The scope could not be advanced past                        the hepatic flexure. Large amount of formed stool                        precluded visualization of a significant portion of the                        mucosa. No lesions noted in the area visualized. Findings:      The entire examined colon appeared normal on direct and retroflexion       views. Impression:           - Preparation of the colon was unsatisfactory.                       - The entire examined colon is normal on direct and                        retroflexion views.                       - No specimens collected. Recommendation:       - Return to endoscopist in 1 week. Procedure Code(s):    --- Professional ---                       [phone], 53, Colonoscopy, flexible; diagnostic, including                        collection of specimen(s) by brushing or washing, when  performed (separate procedure) Diagnosis Code(s):    --- Professional ---                       S43.837, Personal history of other malignant neoplasm                        of large intestine CPT copyright 2017 American Medical Association. All rights reserved. The codes documented in this report are preliminary and upon coder review may  be revised to meet current compliance requirements. Robert Bellow, MD 09/13/2017 10:15:18 AM This report has been signed electronically. Number of Addenda: 0 Note Initiated On: 09/13/2017 8:56 AM Total Procedure Duration: 0 hours 44 minutes 38 seconds        [facility]

## 2017-09-13 NOTE — Anesthesia Preprocedure Evaluation (Signed)
Anesthesia Evaluation  Patient identified by MRN, date of birth, ID band  Reviewed: Allergy & Precautions, NPO status , Patient's Chart, lab work & pertinent test results  History of Anesthesia Complications Negative for: history of anesthetic complications  Airway Mallampati: II       Dental  (+) Partial Upper, Implants, Dental Advidsory Given, Teeth Intact   Pulmonary neg pulmonary ROS, former smoker,           Cardiovascular hypertension, Pt. on medications (-) CAD, (-) Past MI, (-) Cardiac Stents and (-) CABG (-) dysrhythmias (-) Valvular Problems/Murmurs     Neuro/Psych negative neurological ROS     GI/Hepatic Neg liver ROS, GERD  Controlled,H/o colon cancer s/p resection and chemo   Endo/Other  negative endocrine ROS  Renal/GU negative Renal ROS     Musculoskeletal  (+) Arthritis , Osteoarthritis,    Abdominal   Peds  Hematology   Anesthesia Other Findings Past Medical History: No date: Arthritis 2009: Colon cancer ([place])     Comment:  T3,N0; s/p resection  Dr. Hulda Humphrey and chemotherapy by Dr.               Cynda Acres No date: Hernia of flank No date: Hypertension No date: Neuropathy No date: Polyp at cervical os     Comment:  s/p resection Dr. Sabra Heck   Reproductive/Obstetrics                             Anesthesia Physical  Anesthesia Plan  ASA: II  Anesthesia Plan: General   Post-op Pain Management:    Induction: Intravenous  PONV Risk Score and Plan: 3 and Propofol infusion  Airway Management Planned: Nasal Cannula  Additional Equipment:   Intra-op Plan:   Post-operative Plan:   Informed Consent: I have reviewed the patients History and Physical, chart, labs and discussed the procedure including the risks, benefits and alternatives for the proposed anesthesia with the patient or authorized representative who has indicated his/her understanding and acceptance.      Plan Discussed with:   Anesthesia Plan Comments:         Anesthesia Quick Evaluation

## 2017-09-13 NOTE — Anesthesia Procedure Notes (Signed)
Date/Time: 09/13/2017 9:10 AM Performed by: Doreen Salvage, CRNA Pre-anesthesia Checklist: Patient identified, Emergency Drugs available, Suction available and Patient being monitored Patient Re-evaluated:Patient Re-evaluated prior to induction Oxygen Delivery Method: Nasal cannula Induction Type: IV induction Dental Injury: Teeth and Oropharynx as per pre-operative assessment  Comments: Nasal cannula with etCO2 monitoring

## 2017-09-13 NOTE — Anesthesia Post-op Follow-up Note (Signed)
Anesthesia QCDR form completed.        

## 2017-09-13 NOTE — Anesthesia Postprocedure Evaluation (Signed)
Anesthesia Post Note  Patient: Sabrina Wilson  Procedure(s) Performed: COLONOSCOPY WITH PROPOFOL (N/A )  Patient location during evaluation: Endoscopy Anesthesia Type: General Level of consciousness: awake and alert Pain management: pain level controlled Vital Signs Assessment: post-procedure vital signs reviewed and stable Respiratory status: spontaneous breathing, nonlabored ventilation, respiratory function stable and patient connected to nasal cannula oxygen Cardiovascular status: blood pressure returned to baseline and stable Postop Assessment: no apparent nausea or vomiting Anesthetic complications: no     Last Vitals:  Vitals:   09/13/17 1028 09/13/17 1038  BP: (!) 156/79 (!) 152/77  Pulse: 66 (!) 59  Resp: (!) 21 19  Temp:    SpO2: 98% 100%    Last Pain:  Vitals:   09/13/17 1038  TempSrc:   PainSc: 0-No pain                 Martha Clan

## 2017-09-13 NOTE — Transfer of Care (Signed)
Immediate Anesthesia Transfer of Care Note  Patient: Sabrina Wilson  Procedure(s) Performed: COLONOSCOPY WITH PROPOFOL (N/A )  Patient Location: PACU  Anesthesia Type:General  Level of Consciousness: awake, alert  and oriented  Airway & Oxygen Therapy: Patient Spontanous Breathing and Patient connected to nasal cannula oxygen  Post-op Assessment: Report given to RN and Post -op Vital signs reviewed and stable  Post vital signs: Reviewed and stable  Last Vitals:  Vitals Value Taken Time  BP 109/62 09/13/2017 10:08 AM  Temp 35.9 C 09/13/2017 10:08 AM  Pulse 73 09/13/2017 10:11 AM  Resp 21 09/13/2017 10:11 AM  SpO2 99 % 09/13/2017 10:11 AM  Vitals shown include unvalidated device data.  Last Pain:  Vitals:   09/13/17 1008  TempSrc:   PainSc: 0-No pain         Complications: No apparent anesthesia complications

## 2017-09-13 NOTE — H&P (Signed)
No change in history or exam.  Tolerate prep well.  For colonoscopy.

## 2017-09-15 ENCOUNTER — Encounter: Payer: Self-pay | Admitting: General Surgery

## 2017-09-19 ENCOUNTER — Ambulatory Visit (INDEPENDENT_AMBULATORY_CARE_PROVIDER_SITE_OTHER): Payer: Medicare Other | Admitting: General Surgery

## 2017-09-19 ENCOUNTER — Encounter: Payer: Self-pay | Admitting: General Surgery

## 2017-09-19 VITALS — BP 126/72 | HR 70 | Resp 14 | Ht 66.5 in | Wt 195.0 lb

## 2017-09-19 DIAGNOSIS — C187 Malignant neoplasm of sigmoid colon: Secondary | ICD-10-CM

## 2017-09-19 NOTE — Progress Notes (Signed)
Patient ID: Sabrina Wilson, female   DOB: 01-Jan-1941, 77 y.o.   MRN: [id number]  Chief Complaint  Patient presents with  . Routine Post Op    HPI Sabrina Wilson is a 77 y.o. female.  Here for postprocedure visit, colonoscopy was 09-13-17. No GI issues at this time. She states the testing was incomplete.  HPI  Past Medical History:  Diagnosis Date  . Arthritis   . Colon cancer [facility]) 2009   T3,N0; s/p resection  Dr. Hulda Humphrey and chemotherapy by Dr. Cynda Acres  . Hernia of flank   . Hypertension   . Neuropathy   . Polyp at cervical os    s/p resection Dr. Sabra Heck    Past Surgical History:  Procedure Laterality Date  . BREAST BIOPSY Right 05/2014   Dr. Dwyane Luo office-benign  . BREAST SURGERY Right February 2016   Vacuum assisted biopsy for recurrent cyst, fibrocystic changes without evidence of malignancy.  Marland Kitchen CATARACT EXTRACTION W/PHACO Left 01/13/2015   Procedure: CATARACT EXTRACTION PHACO AND INTRAOCULAR LENS PLACEMENT (I[facility]);  Surgeon: Birder Robson, MD;  Location: [facility] ORS;  Service: Ophthalmology;  Laterality: Left;  [place]  1:02.9 AP   19.2 CDE  12.06 casette lot #  [id number]  . CATARACT EXTRACTION W/PHACO Right 02/03/2015   Procedure: CATARACT EXTRACTION PHACO AND INTRAOCULAR LENS PLACEMENT ([place]);  Surgeon: Birder Robson, MD;  Location: [facility] ORS;  Service: Ophthalmology;  Laterality: Right;  us00:53 ap46.5 cde10.79  . COLECTOMY    . COLONOSCOPY  2015   Dr Candace Cruise  . COLONOSCOPY WITH PROPOFOL N/A 09/13/2017   Procedure: COLONOSCOPY WITH PROPOFOL;  Surgeon: Robert Bellow, MD;  Location: [facility] ENDOSCOPY;  Service: Endoscopy;  Laterality: N/A;  . colostomy reversal    . DILATION AND CURETTAGE OF UTERUS  2014   Dr. Sabra Heck, benign per pt  . EYE SURGERY    . HERNIA REPAIR  07/13/12   ventral   . TONSILLECTOMY      Family History  Problem Relation Age of Onset  . Stroke Mother   . Diabetes Mother   . Cancer Father        colon  . Cancer Brother        colon  . Cancer  Brother        brain    Social History Social History   Tobacco Use  . Smoking status: Former Smoker    Last attempt to quit: 10/12/2007    Years since quitting: 9.9  . Smokeless tobacco: Never Used  Substance Use Topics  . Alcohol use: Yes    Alcohol/week: 0.6 oz    Types: 1 Standard drinks or equivalent per week    Comment: with dinner  . Drug use: No    Allergies  Allergen Reactions  . Sulfa Antibiotics Hives    Current Outpatient Medications  Medication Sig Dispense Refill  . atorvastatin (LIPITOR) 10 MG tablet Take 1 tablet (10 mg total) by mouth daily. 90 tablet 3  . azelastine (ASTELIN) 0.1 % nasal spray Place 2 sprays into both nostrils 2 (two) times daily. Use in each nostril as directed 30 mL 12  . fluticasone (FLONASE) 50 MCG/ACT nasal spray Place 2 sprays into both nostrils daily. 16 g 6  . losartan (COZAAR) 100 MG tablet TAKE ONE TABLET BY MOUTH DAILY 90 tablet 0  . Omega-3 Fatty Acids (FISH OIL PO) Take by mouth.     No current facility-administered medications for this visit.     Review of Systems Review of  Systems  Constitutional: Negative.   Respiratory: Negative.   Cardiovascular: Negative.     Blood pressure 126/72, pulse 70, resp. rate 14, height 5' 6.5" (1.689 m), weight 195 lb (88.5 kg).  Physical Exam Physical Exam  Constitutional: She is oriented to person, place, and time. She appears well-developed and well-nourished.  Neurological: She is alert and oriented to person, place, and time.  Skin: Skin is warm and dry.  Psychiatric: Her behavior is normal.    Data Reviewed Colonoscopy was completed to the hepatic flexure at which time a combination of tortuosity of the colon, elongation of the colon and prep precluded further passage.  Assessment    Complete colonoscopy, poor prep.    Plan    Discussed options based bar which include moving directly to a repeat colonoscopy after a prolonged prep versus completing Cologuard and if  negative deferring reassessment for 3 years.  The patient reports following the prep instructions as requested, and I suspect that slow transit was the cause for the poor prep.  If her Cologuard is positive we will plan for repeat colonoscopy with a long prep.  I do not believe the patient is a good candidate for barium enema or CT colonoscopy as the likelihood of a significant volume of retained stool is high.  Plan Cologuard. Order faxed.      HPI, Physical Exam, Assessment and Plan have been scribed under the direction and in the presence of Robert Bellow, MD. Karie Fetch, RN  I have completed the exam and reviewed the above documentation for accuracy and completeness.  I agree with the above.  Haematologist has been used and any errors in dictation or transcription are unintentional.  Hervey Ard, M.D., F.A.C.S.  Forest Gleason Brylan Dec 09/19/2017, 10:43 AM

## 2017-09-19 NOTE — Patient Instructions (Addendum)
The patient is aware to call back for any questions or concerns. Cologuard testing

## 2017-09-25 DIAGNOSIS — Z1211 Encounter for screening for malignant neoplasm of colon: Secondary | ICD-10-CM | POA: Diagnosis not present

## 2017-09-25 DIAGNOSIS — Z1212 Encounter for screening for malignant neoplasm of rectum: Secondary | ICD-10-CM | POA: Diagnosis not present

## 2017-10-19 ENCOUNTER — Encounter: Payer: Self-pay | Admitting: General Surgery

## 2017-10-19 LAB — COLOGUARD

## 2017-10-20 ENCOUNTER — Telehealth: Payer: Self-pay

## 2017-10-20 ENCOUNTER — Other Ambulatory Visit: Payer: Self-pay | Admitting: General Surgery

## 2017-10-20 DIAGNOSIS — R195 Other fecal abnormalities: Secondary | ICD-10-CM

## 2017-10-20 MED ORDER — POLYETHYLENE GLYCOL 3350 17 GM/SCOOP PO POWD: 1.0000 | Freq: Once | ORAL | 0 refills | Status: AC

## 2017-10-20 NOTE — Telephone Encounter (Signed)
Call to patient to see about scheduling her Colonoscopy. The patient is scheduled for a Colonoscopy at [facility] on 11/08/17. She will complete a 3 day liquid prep. All instructions have been faxed to the patient and she will call back with any questions. She is aware to call the day before to get her arrival time. Miralax prescription has been sent into the patient's pharmacy. The patient is aware of date and instructions.

## 2017-10-20 NOTE — Telephone Encounter (Signed)
-----   Message from Robert Bellow, MD sent at 10/19/2017 12:22 PM EDT ----- I left the patient a message that the Cologuard test came back positive, so we are going to need to look at that last little bit of colon.  Her last prep was less than ideal.   We need to set her up for repeat colonoscopy with a prolonged prep.  Clear liquids with nutritional supplements such as unsure day 3 before and day to before.  No other solid foods.  Citrate of magnesia day 2 prior to the procedure (10 ounces) and then normal MiraLAX prep the day before.  Also, Dulcolax, 2-5 mg tablets the morning of the standard prep and at noontime.  I left a message that you would be contacting her.

## 2017-10-20 NOTE — Progress Notes (Signed)
The patient underwent a colonoscopy on Aug 24, 2017.  The prep was poor and the scope only reach the hepatic flexure.  It was elected to see if the Cologuard test was negative, to defer repeat intervention for 3 years.  Her Cologuard unfortunately came back positive necessitating completion colonoscopy.  Considering the very poor prep with standard routine, I do not think she would be a good candidate for CT colonoscopy.  Will make use of a 3-day prep and see if with a better prep we can according the colon and get a complete exam.

## 2017-10-26 ENCOUNTER — Other Ambulatory Visit: Payer: Self-pay | Admitting: Family Medicine

## 2017-11-07 ENCOUNTER — Encounter: Payer: Self-pay | Admitting: Student

## 2017-11-08 ENCOUNTER — Ambulatory Visit: Payer: Medicare Other | Admitting: Certified Registered Nurse Anesthetist

## 2017-11-08 ENCOUNTER — Encounter
Admission: RE | Disposition: A | Discharge status: Home / Self Care | Payer: Self-pay | Source: Ambulatory Visit | Attending: General Surgery

## 2017-11-08 ENCOUNTER — Ambulatory Visit
Admission: RE | Admit: 2017-11-08 | Discharge: 2017-11-08 | Disposition: A | Discharge status: Home / Self Care | Payer: Medicare Other | Source: Ambulatory Visit | Attending: General Surgery | Admitting: General Surgery

## 2017-11-08 DIAGNOSIS — Z85038 Personal history of other malignant neoplasm of large intestine: Secondary | ICD-10-CM | POA: Diagnosis not present

## 2017-11-08 DIAGNOSIS — D123 Benign neoplasm of transverse colon: Secondary | ICD-10-CM | POA: Insufficient documentation

## 2017-11-08 DIAGNOSIS — R195 Other fecal abnormalities: Secondary | ICD-10-CM | POA: Diagnosis not present

## 2017-11-08 DIAGNOSIS — D128 Benign neoplasm of rectum: Secondary | ICD-10-CM | POA: Insufficient documentation

## 2017-11-08 DIAGNOSIS — K5790 Diverticulosis of intestine, part unspecified, without perforation or abscess without bleeding: Secondary | ICD-10-CM | POA: Diagnosis not present

## 2017-11-08 DIAGNOSIS — Z882 Allergy status to sulfonamides status: Secondary | ICD-10-CM | POA: Diagnosis not present

## 2017-11-08 DIAGNOSIS — K621 Rectal polyp: Secondary | ICD-10-CM | POA: Diagnosis not present

## 2017-11-08 DIAGNOSIS — K5730 Diverticulosis of large intestine without perforation or abscess without bleeding: Secondary | ICD-10-CM | POA: Insufficient documentation

## 2017-11-08 DIAGNOSIS — I10 Essential (primary) hypertension: Secondary | ICD-10-CM | POA: Insufficient documentation

## 2017-11-08 DIAGNOSIS — K635 Polyp of colon: Secondary | ICD-10-CM | POA: Diagnosis not present

## 2017-11-08 DIAGNOSIS — Z1211 Encounter for screening for malignant neoplasm of colon: Secondary | ICD-10-CM | POA: Diagnosis not present

## 2017-11-08 DIAGNOSIS — G629 Polyneuropathy, unspecified: Secondary | ICD-10-CM | POA: Insufficient documentation

## 2017-11-08 DIAGNOSIS — D124 Benign neoplasm of descending colon: Secondary | ICD-10-CM | POA: Diagnosis not present

## 2017-11-08 DIAGNOSIS — M1990 Unspecified osteoarthritis, unspecified site: Secondary | ICD-10-CM | POA: Insufficient documentation

## 2017-11-08 DIAGNOSIS — Z87891 Personal history of nicotine dependence: Secondary | ICD-10-CM | POA: Insufficient documentation

## 2017-11-08 HISTORY — PX: COLONOSCOPY WITH PROPOFOL: SHX5780

## 2017-11-08 SURGERY — COLONOSCOPY WITH PROPOFOL
Anesthesia: General

## 2017-11-08 MED ORDER — PROPOFOL 500 MG/50ML IV EMUL
INTRAVENOUS | Status: DC | PRN
  Administered 2017-11-08: 160 ug/kg/min via INTRAVENOUS

## 2017-11-08 MED ORDER — SODIUM CHLORIDE 0.9 % IV SOLN
INTRAVENOUS | Status: DC
  Administered 2017-11-08: 1000 mL via INTRAVENOUS

## 2017-11-08 MED ORDER — SUCCINYLCHOLINE CHLORIDE 20 MG/ML IJ SOLN
INTRAMUSCULAR | Status: AC
  Filled 2017-11-08: qty 1

## 2017-11-08 MED ORDER — PHENYLEPHRINE HCL 10 MG/ML IJ SOLN
INTRAMUSCULAR | Status: DC | PRN
  Administered 2017-11-08 (×3): 100 ug via INTRAVENOUS

## 2017-11-08 MED ORDER — EPHEDRINE SULFATE 50 MG/ML IJ SOLN
INTRAMUSCULAR | Status: AC
  Filled 2017-11-08: qty 1

## 2017-11-08 MED ORDER — GLYCOPYRROLATE 0.2 MG/ML IJ SOLN
INTRAMUSCULAR | Status: AC
  Filled 2017-11-08: qty 1

## 2017-11-08 MED ORDER — PROPOFOL 10 MG/ML IV BOLUS
INTRAVENOUS | Status: DC | PRN
  Administered 2017-11-08: 20 mg via INTRAVENOUS
  Administered 2017-11-08: 70 mg via INTRAVENOUS

## 2017-11-08 MED ORDER — PROPOFOL 500 MG/50ML IV EMUL
INTRAVENOUS | Status: AC
  Filled 2017-11-08: qty 100

## 2017-11-08 MED ORDER — LIDOCAINE HCL (PF) 2 % IJ SOLN
INTRAMUSCULAR | Status: AC
  Filled 2017-11-08: qty 10

## 2017-11-08 MED ORDER — PHENYLEPHRINE HCL 10 MG/ML IJ SOLN
INTRAMUSCULAR | Status: AC
  Filled 2017-11-08: qty 1

## 2017-11-08 NOTE — Transfer of Care (Signed)
Immediate Anesthesia Transfer of Care Note  Patient: Sabrina Wilson  Procedure(s) Performed: COLONOSCOPY WITH PROPOFOL (N/A )  Patient Location: PACU  Anesthesia Type:General  Level of Consciousness: awake, alert  and oriented  Airway & Oxygen Therapy: Patient Spontanous Breathing and Patient connected to nasal cannula oxygen  Post-op Assessment: Report given to RN and Post -op Vital signs reviewed and stable  Post vital signs: Reviewed and stable  Last Vitals:  Vitals Value Taken Time  BP 118/64 11/08/2017  8:10 AM  Temp 35.7 C 11/08/2017  8:10 AM  Pulse 71 11/08/2017  8:10 AM  Resp 12 11/08/2017  8:10 AM  SpO2 98 % 11/08/2017  8:10 AM    Last Pain:  Vitals:   11/08/17 0810  TempSrc: Tympanic  PainSc: 0-No pain         Complications: No apparent anesthesia complications

## 2017-11-08 NOTE — Anesthesia Preprocedure Evaluation (Signed)
Anesthesia Evaluation  Patient identified by MRN, date of birth, ID band Patient awake    Reviewed: Allergy & Precautions, H&P , NPO status , reviewed documented beta blocker date and time   Airway Mallampati: II  TM Distance: >3 FB Neck ROM: limited    Dental  (+) Partial Upper, Partial Lower, Implants, Chipped   Pulmonary former smoker,    Pulmonary exam normal        Cardiovascular hypertension, Normal cardiovascular exam     Neuro/Psych  Neuromuscular disease    GI/Hepatic   Endo/Other    Renal/GU      Musculoskeletal  (+) Arthritis ,   Abdominal   Peds  Hematology   Anesthesia Other Findings Past Medical History: No date: Arthritis 2009: Colon cancer ([place])     Comment:  T3,N0; s/p resection  Dr. Hulda Humphrey and chemotherapy by Dr.               C[place]da [place] No date: Hernia of flank No date: Hypertension No date: Neuropathy No date: Polyp at cervical os     Comment:  s/p resection Dr. Sabra Heck  Past Surgical History: 05/2014: BREAST BIOPSY; Right     Comment:  Dr. Dwyane Luo office-benign February 2016: BREAST SURGERY; Right     Comment:  Vacuum assisted biopsy for recurrent cyst, fibrocystic               changes without evidence of malignancy. 01/13/2015: CATARACT EXTRACTION W/PHACO; Left     Comment:  Procedure: CATARACT EXTRACTION PHACO AND INTRAOCULAR               LENS PLACEMENT (IOC);  Surgeon: Birder Robson, MD;                Location: [facility] ORS;  Service: Ophthalmology;  Laterality:              Left;  [place]  1:02.9 AP   19.2 CDE  12.06 casette lot #                [id number] 02/03/2015: CATARACT EXTRACTION W/PHACO; Right     Comment:  Procedure: CATARACT EXTRACTION PHACO AND INTRAOCULAR               LENS PLACEMENT (IOC);  Surgeon: Birder Robson, MD;                Location: [facility] ORS;  Service: Ophthalmology;  Laterality:              Right;  us00:53 ap46.5 cde10.79 No date: COLECTOMY 2015:  COLONOSCOPY     Comment:  Dr Candace Cruise 09/13/2017: COLONOSCOPY WITH PROPOFOL; N/A     Comment:  Procedure: COLONOSCOPY WITH PROPOFOL;  Surgeon: Robert Bellow, MD;  Location: [facility] ENDOSCOPY;  Service:               Endoscopy;  Laterality: N/A; No date: colostomy reversal 2014: DILATION AND CURETTAGE OF UTERUS     Comment:  Dr. Sabra Heck, benign per pt No date: EYE SURGERY 07/13/12: HERNIA REPAIR     Comment:  ventral  No date: TONSILLECTOMY  BMI    Body Mass Index:  30.21 kg/m      Reproductive/Obstetrics                             Anesthesia Physical Anesthesia Plan  ASA: II  Anesthesia Plan: General  Post-op Pain Management:    Induction:   PONV Risk Score and Plan: 3 and Treatment may vary due to age or medical condition and TIVA  Airway Management Planned:   Additional Equipment:   Intra-op Plan:   Post-operative Plan:   Informed Consent: I have reviewed the patients History and Physical, chart, labs and discussed the procedure including the risks, benefits and alternatives for the proposed anesthesia with the patient or authorized representative who has indicated his/her understanding and acceptance.   Dental Advisory Given  Plan Discussed with: CRNA  Anesthesia Plan Comments:         Anesthesia Quick Evaluation

## 2017-11-08 NOTE — Anesthesia Post-op Follow-up Note (Signed)
Anesthesia QCDR form completed.        

## 2017-11-08 NOTE — Anesthesia Postprocedure Evaluation (Signed)
Anesthesia Post Note  Patient: Sabrina Wilson  Procedure(s) Performed: COLONOSCOPY WITH PROPOFOL (N/A )  Patient location during evaluation: Endoscopy Anesthesia Type: General Level of consciousness: awake and alert Pain management: pain level controlled Vital Signs Assessment: post-procedure vital signs reviewed and stable Respiratory status: spontaneous breathing, nonlabored ventilation and respiratory function stable Cardiovascular status: blood pressure returned to baseline and stable Postop Assessment: no apparent nausea or vomiting Anesthetic complications: no     Last Vitals:  Vitals:   11/08/17 0820 11/08/17 0830  BP: 118/63 (!) 143/76  Pulse: 70 67  Resp: (!) 23 20  Temp:    SpO2: 99% 99%    Last Pain:  Vitals:   11/08/17 0830  TempSrc:   PainSc: 0-No pain                 Alphonsus Sias

## 2017-11-08 NOTE — Anesthesia Procedure Notes (Signed)
Performed by: Dellis Voght, CRNA Pre-anesthesia Checklist: Emergency Drugs available, Patient identified, Suction available, Patient being monitored and Timeout performed Patient Re-evaluated:Patient Re-evaluated prior to induction Oxygen Delivery Method: Nasal cannula Induction Type: IV induction       

## 2017-11-08 NOTE — Op Note (Signed)
[facility] Gastroenterology Patient Name: Sabrina Wilson Procedure Date: 11/08/2017 7:31 AM MRN: [id number] Account #: [id number] Date of Birth: [birth year] Admit Type: Outpatient Age: 77 Room: [facility] ROOM 1 Gender: Female Note Status: Finalized Procedure:            Colonoscopy Indications:          High risk colon cancer surveillance: Personal history                        of colon cancer Providers:            Robert Bellow, MD Referring MD:         Angela Adam. Caryl Bis (Referring MD) Medicines:            Monitored Anesthesia Care Complications:        No immediate complications. Procedure:            Pre-Anesthesia Assessment:                       - Prior to the procedure, a History and Physical was                        performed, and patient medications, allergies and                        sensitivities were reviewed. The patient's tolerance of                        previous anesthesia was reviewed.                       - The risks and benefits of the procedure and the                        sedation options and risks were discussed with the                        patient. All questions were answered and informed                        consent was obtained.                       After obtaining informed consent, the colonoscope was                        passed under direct vision. Throughout the procedure,                        the patient's blood pressure, pulse, and oxygen                        saturations were monitored continuously. The                        Colonoscope was introduced through the anus and                        advanced to the the cecum, identified by appendiceal  orifice and ileocecal valve. The colonoscopy was                        performed without difficulty. The patient tolerated the                        procedure well. The quality of the bowel preparation                        was good.                    This was a repeat exam after a three day prep due to an                        incomplete exam several weeks ago with a bad prep.                        Cologuard was positive prompting today's repeat exam. Findings:      A few medium-mouthed diverticula were found in the ascending colon.      A      2- 5 mm polyps were found in the rectum, mid descending colon and       proximal transverse colon. The polyps were sessile. Biopsies were taken       from each of the six polyps with a cold forceps for histology.      The retroflexed view of the distal rectum and anal verge was normal and       showed no anal or rectal abnormalities. Impression:           - Diverticulosis in the ascending colon.                       - One 5 mm polyp in the rectum in the descending colon                        in the mid descending colon in the proximal transverse                        colon. Biopsied.                       - The distal rectum and anal verge are normal on                        retroflexion view. Recommendation:       - Telephone endoscopist for pathology results in 1 week. Procedure Code(s):    --- Professional ---                       [phone], Colonoscopy, flexible; with biopsy, single or                        multiple Diagnosis Code(s):    --- Professional ---                       D92.426, Personal history of other malignant neoplasm                        of large intestine  K62.1, Rectal polyp                       D12.4, Benign neoplasm of descending colon                       K57.30, Diverticulosis of large intestine without                        perforation or abscess without bleeding                       D12.3, Benign neoplasm of transverse colon (hepatic                        flexure or splenic flexure) CPT copyright 2017 American Medical Association. All rights reserved. The codes documented in this report are preliminary and upon  coder review may  be revised to meet current compliance requirements. Robert Bellow, MD 11/08/2017 8:13:23 AM This report has been signed electronically. Number of Addenda: 0 Note Initiated On: 11/08/2017 7:31 AM Scope Withdrawal Time: 0 hours 20 minutes 19 seconds  Total Procedure Duration: 0 hours 30 minutes 17 seconds       [facility]

## 2017-11-08 NOTE — H&P (Signed)
No change in clinical history or exam. For repeat colonoscopy.

## 2017-11-09 ENCOUNTER — Encounter: Payer: Self-pay | Admitting: General Surgery

## 2017-11-10 LAB — SURGICAL PATHOLOGY

## 2017-11-11 ENCOUNTER — Telehealth: Payer: Self-pay | Admitting: General Surgery

## 2017-11-11 NOTE — Telephone Encounter (Signed)
Notified path OK.  Will arrange for a f/u colonoscopy in five years.

## 2017-11-17 NOTE — H&P (Signed)
Sabrina Wilson [id number] 1941/03/06     HPI: Patient with past history colon cancer. Incomplete colonoscopy several weeks ago. Positive Cologuard. For repeat colonoscopy after extensive prep.   No medications prior to admission.   Allergies  Allergen Reactions  . Sulfa Antibiotics Hives   Past Medical History:  Diagnosis Date  . Arthritis   . Colon cancer [facility]) 2009   T3,N0; s/p resection  Dr. Hulda Humphrey and chemotherapy by Dr. Cynda Acres  . Hernia of flank   . Hypertension   . Neuropathy   . Polyp at cervical os    s/p resection Dr. Sabra Heck   Past Surgical History:  Procedure Laterality Date  . BREAST BIOPSY Right 05/2014   Dr. Dwyane Luo office-benign  . BREAST SURGERY Right February 2016   Vacuum assisted biopsy for recurrent cyst, fibrocystic changes without evidence of malignancy.  [place] [place] CATARACT EXTRACTION W/PHACO Left 01/13/2015   Procedure: CATARACT EXTRACTION PHACO AND INTRAOCULAR LENS PLACEMENT (IOC);  Surgeon: Birder Robson, MD;  Location: [facility] ORS;  Service: Ophthalmology;  Laterality: Left;  [place]  1:02.9 AP   19.2 CDE  12.06 casette lot #  [id number]  . CATARACT EXTRACTION W/PHACO Right 02/03/2015   Procedure: CATARACT EXTRACTION PHACO AND INTRAOCULAR LENS PLACEMENT ([place]);  Surgeon: Birder Robson, MD;  Location: [facility] ORS;  Service: Ophthalmology;  Laterality: Right;  us00:53 ap46.5 cde10.79  . COLECTOMY    . COLONOSCOPY  2015   Dr Candace Cruise  . COLONOSCOPY WITH PROPOFOL N/A 09/13/2017   Procedure: COLONOSCOPY WITH PROPOFOL;  Surgeon: Robert Bellow, MD;  [facility] END[facility];  Service: Endoscopy;  Laterality: N/A;  . COLONOSCOPY WITH PROPOFOL N/A 11/08/2017   Procedure: COLONOSCOPY WITH PROPOFOL;  Surgeon: Robert Bellow, MD;  Location: [facility] ENDOSCOPY;  Service: Endoscopy;  Laterality: N/A;  . colostomy reversal    . DILATION AND CURETTAGE OF UTERUS  2014   Dr. Sabra Heck, benign per pt  . EYE SURGERY    . HERNIA REPAIR  07/13/12   ventral   . TONSILLECTOMY      Social History   Socioeconomic History  . Marital status: Married    Spouse name: Not on file  . Number of children: Not on file  . Years of education: Not on file  . Highest education level: Not on file  Occupational History  . Not on file  Social Needs  . Financial resource strain: Not on file  . Food insecurity:    Worry: Not on file    Inability: Not on file  . Transportation needs:    Medical: Not on file    Non-medical: Not on file  Tobacco Use  . Smoking status: Former Smoker    Last attempt to quit: 10/12/2007    Years since quitting: 10.1  . Smokeless tobacco: Never Used  Substance and Sexual Activity  . Alcohol use: Yes    Alcohol/week: 0.6 oz    Types: 1 Standard drinks or equivalent per week    Comment: with dinner  . Drug use: No  . Sexual activity: Not on file  Lifestyle  . Physical activity:    Days per week: Not on file    Minutes per session: Not on file  . Stress: Not on file  Relationships  . Social connections:    Talks on phone: Not on file    Gets together: Not on file    Attends religious service: Not on file    Active member of club or organization: Not on file  Attends meetings of clubs or organizations: Not on file    Relationship status: Not on file  . Intimate partner violence:    Fear of current or ex partner: Not on file    Emotionally abused: Not on file    Physically abused: Not on file    Forced sexual activity: Not on file  Other Topics Concern  . Not on file  Social History Narrative   Lives in [place] with husband. 2 children, son lives nearby.      Diet - regular      Exercise - none   Social History   Social History Narrative   Lives in [place] with husband. 2 children, son lives nearby.      Diet - regular      Exercise - none     ROS: Negative.     PE: HEENT: Negative. Lungs: Clear. Cardio: RR.  Assessment/Plan:  Proceed with planned endoscopy.  Robert Bellow

## 2018-01-31 ENCOUNTER — Encounter: Payer: Self-pay | Admitting: Family Medicine

## 2018-01-31 ENCOUNTER — Ambulatory Visit: Payer: Medicare Other | Admitting: Family Medicine

## 2018-01-31 VITALS — BP 160/110 | HR 56 | Temp 97.6°F | Ht 66.0 in | Wt 193.6 lb

## 2018-01-31 DIAGNOSIS — R7303 Prediabetes: Secondary | ICD-10-CM

## 2018-01-31 DIAGNOSIS — Z23 Encounter for immunization: Secondary | ICD-10-CM

## 2018-01-31 DIAGNOSIS — E785 Hyperlipidemia, unspecified: Secondary | ICD-10-CM | POA: Diagnosis not present

## 2018-01-31 DIAGNOSIS — I83893 Varicose veins of bilateral lower extremities with other complications: Secondary | ICD-10-CM | POA: Diagnosis not present

## 2018-01-31 DIAGNOSIS — I10 Essential (primary) hypertension: Secondary | ICD-10-CM | POA: Diagnosis not present

## 2018-01-31 DIAGNOSIS — K219 Gastro-esophageal reflux disease without esophagitis: Secondary | ICD-10-CM | POA: Insufficient documentation

## 2018-01-31 LAB — BASIC METABOLIC PANEL
BUN: 17 mg/dL (ref 6–23)
CHLORIDE: 102 meq/L (ref 96–112)
CO2: 31 mEq/L (ref 19–32)
Calcium: 9.9 mg/dL (ref 8.4–10.5)
Creatinine, Ser: 0.87 mg/dL (ref 0.40–1.20)
GFR: 67.46 mL/min (ref >60.00)
Glucose, Bld: 112 mg/dL — ABNORMAL HIGH (ref 70–99)
POTASSIUM: 3.7 meq/L (ref 3.5–5.1)
SODIUM: 138 meq/L (ref 135–145)

## 2018-01-31 LAB — HEMOGLOBIN A1C: HEMOGLOBIN A1C: 6 % (ref 4.6–6.5)

## 2018-01-31 NOTE — Patient Instructions (Addendum)
Nice to see you. Please try Pepcid twice daily before breakfast and dinner to see if that helps with your reflux. We will get you to see vascular surgery for your varicose veins. Please start exercising. Please start monitoring your blood pressure daily.  We will have you return next week for BP check.

## 2018-01-31 NOTE — Assessment & Plan Note (Signed)
Patient with varicose veins.  Will refer to vascular surgery.

## 2018-01-31 NOTE — Assessment & Plan Note (Signed)
Elevated today though had been very well controlled.  She will start checking at home and return next week for BP check with nursing.  She will continue her current regimen.

## 2018-01-31 NOTE — Assessment & Plan Note (Signed)
Patient has a long history of GERD bothering her about once a week.  Symptoms are consistent with reflux.  Resolves with Tums.  Discussed trying Pepcid twice daily for 2 weeks to see if that would get her symptoms under control.  Given return precautions.

## 2018-01-31 NOTE — Assessment & Plan Note (Signed)
-  Continue Lipitor °

## 2018-01-31 NOTE — Assessment & Plan Note (Signed)
Check A1c.  Encouraged exercise.  Monitor diet.

## 2018-01-31 NOTE — Progress Notes (Signed)
Tommi Rumps, MD Phone: [phone]  Sabrina Wilson is a 77 y.o. female who presents today for f/u.  CC: htn, prediabetes, hld, varicose veins, GERD  HYPERTENSION  Disease Monitoring  Home BP Monitoring 130-140/60-70s      Chest pain- no    Dyspnea- no Medications  Compliance-  Taking losartan.  Edema- no  HYPERLIPIDEMIA Symptoms Chest pain on exertion:  no   Medications: Compliance- taking lipitor Right upper quadrant pain- no  Muscle aches- no  Prediabetes: No polyuria or polydipsia.  She does not eat very many sweets.  Not much soda.  Eats fruits and vegetables.  She is going to start going to water aerobics.  Varicose veins: She has had them for a long time.  Does not like the way they look.  Occasionally one on the lateral aspect of her right knee will bother her a little bit if she sits for too long.  She will get up and it will improve.  No edema.  GERD: Patient notes this bothers her about once a week.  She will have a burning sensation in her upper chest with this.  Notes it is mild.  No sour taste.  No chest pain or shortness of breath with this.  No dysphagia or blood in her stool.  No abdominal pain.  She notes it goes away with Tums or club soda.  Notes mild reflux at this time.  It is the same as her typical reflux.  She has had reflux intermittently for years.    Social History   Tobacco Use  Smoking Status Former Smoker  . Last attempt to quit: 10/12/2007  . Years since quitting: 10.3  Smokeless Tobacco Never Used     ROS see history of present illness  Objective  Physical Exam Vitals:   01/31/18 1311  BP: (!) 160/110  Pulse: (!) 56  Temp: 97.6 F (36.4 C)  SpO2: 98%    BP Readings from Last 3 Encounters:  01/31/18 (!) 160/110  11/08/17 (!) 143/76  09/19/17 126/72   Wt Readings from Last 3 Encounters:  01/31/18 193 lb 9.6 oz (87.8 kg)  11/08/17 190 lb (86.2 kg)  09/19/17 195 lb (88.5 kg)    Physical Exam  Constitutional: No  distress.  Cardiovascular: Normal rate, regular rhythm and normal heart sounds.  Pulmonary/Chest: Effort normal and breath sounds normal.  Abdominal: Soft. Bowel sounds are normal. She exhibits no distension. There is no tenderness. There is no rebound and no guarding.  Musculoskeletal: She exhibits no edema.  Get her varicose veins in her knees  Neurological: She is alert.  Skin: Skin is warm and dry. She is not diaphoretic.     Assessment/Plan: Please see individual problem list.  Hypertension Elevated today though had been very well controlled.  She will start checking at home and return next week for BP check with nursing.  She will continue her current regimen.  Hyperlipidemia Continue Lipitor.  Prediabetes Check A1c.  Encouraged exercise.  Monitor diet.  GERD (gastroesophageal reflux disease) Patient has a long history of GERD bothering her about once a week.  Symptoms are consistent with reflux.  Resolves with Tums.  Discussed trying Pepcid twice daily for 2 weeks to see if that would get her symptoms under control.  Given return precautions.  Varicose veins of bilateral lower extremities with other complications Patient with varicose veins.  Will refer to vascular surgery.   Orders Placed This Encounter  Procedures  . Flu vaccine HIGH DOSE PF (  Fluzone High dose)  . HgB A1c  . Basic Metabolic Panel (BMET)  . Ambulatory referral to Vascular Surgery    Referral Priority:   Routine    Referral Type:   Surgical    Referral Reason:   Specialty Services Required    Requested Specialty:   Vascular Surgery    Number of Visits Requested:   1    No orders of the defined types were placed in this encounter.    Tommi Rumps, MD [place]

## 2018-02-05 ENCOUNTER — Ambulatory Visit (INDEPENDENT_AMBULATORY_CARE_PROVIDER_SITE_OTHER): Payer: Medicare Other | Admitting: *Deleted

## 2018-02-05 VITALS — BP 140/90 | HR 71

## 2018-02-05 DIAGNOSIS — I10 Essential (primary) hypertension: Secondary | ICD-10-CM | POA: Diagnosis not present

## 2018-02-05 NOTE — Progress Notes (Signed)
Patient here for nurse visit BP check per order from 01/31/18  Patient reports compliance with prescribed BP medications: yes  Last dose of BP medication: AM today  BP Readings from Last 3 Encounters:  02/05/18 (!) 160/90  01/31/18 (!) 160/110  11/08/17 (!) 143/76   Pulse Readings from Last 3 Encounters:  01/31/18 (!) 56  11/08/17 67  09/19/17 70    Patient allowed to rest 15 minutes second BP 140/90 pulse 71, Home reading given to [place].  Patient verbalized understanding of instructions.   Kerin Salen, LPN

## 2018-02-06 NOTE — Progress Notes (Signed)
Patient noified and voiced understanding, she will call if BP consistently over 140/90.

## 2018-02-06 NOTE — Progress Notes (Signed)
Blood pressure at home is acceptable.  She should continue to monitor and if it consistently goes above 140/90 she should contact us.

## 2018-02-13 ENCOUNTER — Ambulatory Visit (INDEPENDENT_AMBULATORY_CARE_PROVIDER_SITE_OTHER): Payer: Medicare Other | Admitting: Vascular Surgery

## 2018-02-13 ENCOUNTER — Encounter (INDEPENDENT_AMBULATORY_CARE_PROVIDER_SITE_OTHER): Payer: Self-pay | Admitting: Vascular Surgery

## 2018-02-13 VITALS — BP 146/86 | HR 80 | Resp 17 | Ht 66.5 in | Wt 192.0 lb

## 2018-02-13 DIAGNOSIS — R600 Localized edema: Secondary | ICD-10-CM

## 2018-02-13 DIAGNOSIS — Z87891 Personal history of nicotine dependence: Secondary | ICD-10-CM

## 2018-02-13 DIAGNOSIS — I83893 Varicose veins of bilateral lower extremities with other complications: Secondary | ICD-10-CM

## 2018-02-13 NOTE — Progress Notes (Signed)
Subjective:    Patient ID: Sabrina Wilson, female    DOB: [birth year], 77 y.o.   MRN: [id number] Chief Complaint  Patient presents with  . New Patient (Initial Visit)    Varicose Veins   Presents as a new patient referred by Dr. Caryl Bis for evaluation of painful varicose veins and edema.  The patient endorses a long-standing history of painful varicose veins however has noticed they have increased in number and discomfort over the past 3 years.  So experiences edema to the bilateral legs.  The patient has noticed a worsening in the amount of edema she experiences as well.  Both the patient's discomfort and edema worsen with sitting and standing for long periods of time.  The patient also notes that her symptoms worsen towards the end of the day.  The patient denies any recent surgery or trauma to the bilateral legs.  The patient denies any DVT history or recurrent recent bouts of cellulitis.  This time, the patient does not engage in conservative therapy including wearing medical grade 1 compression socks, elevating her legs and remaining active on a daily basis.  Patient does feel that her symptoms have progressed to the point that she is unable to function on a daily basis and they have become lifestyle limiting.  She denies any claudication-like symptoms, rest pain or ulcer formation to the bilateral legs.  The patient denies any fever, nausea vomiting.  Review of Systems  Constitutional: Negative.   HENT: Negative.   Eyes: Negative.   Respiratory: Negative.   Cardiovascular: Positive for leg swelling.       Painful varicose veins  Gastrointestinal: Negative.   Endocrine: Negative.   Genitourinary: Negative.   Musculoskeletal: Negative.   Skin: Negative.   Allergic/Immunologic: Negative.   Neurological: Negative.   Hematological: Negative.   Psychiatric/Behavioral: Negative.       Objective:   Physical Exam  Constitutional: She is oriented to person, place, and time. She  appears well-developed and well-nourished. No distress.  HENT:  Head: Normocephalic and atraumatic.  Right Ear: External ear normal.  Left Ear: External ear normal.  Eyes: Pupils are equal, round, and reactive to light. Conjunctivae are normal.  Neck: Normal range of motion.  Cardiovascular: Normal rate, regular rhythm, normal heart sounds and intact distal pulses.  Pulses:      Radial pulses are 2+ on the right side, and 2+ on the left side.       Dorsalis pedis pulses are 2+ on the right side, and 2+ on the left side.       Posterior tibial pulses are 2+ on the right side, and 2+ on the left side.  Pulmonary/Chest: Effort normal and breath sounds normal.  Musculoskeletal: Normal range of motion. She exhibits edema (Mild nonpitting edema noted bilaterally).  Neurological: She is alert and oriented to person, place, and time.  Skin: Skin is warm and dry. She is not diaphoretic.  Diffuse mixture of 1 cm and greater than 1 cm varicosities noted to the bilateral legs.  Stasis dermatitis noted bilaterally.  There is no fibrosis, sialitis or active ulcerations noted at this time.  Psychiatric: She has a normal mood and affect. Her behavior is normal. Judgment and thought content normal.   BP (!) 146/86 (BP Location: Right Arm, Patient Position: Sitting)   Pulse 80   Resp 17   Ht 5' 6.5" (1.689 m)   Wt 192 lb (87.1 kg)   BMI 30.53 kg/m   Past Medical History:  Diagnosis Date  . Arthritis   . Colon cancer [facility]) 2009   T3,N0; s/p resection  Dr. Hulda Humphrey and chemotherapy by Dr. Cynda Acres  . Hernia of flank   . Hypertension   . Neuropathy   . Polyp at cervical os    s/p resection Dr. Sabra Heck   Social History   Socioeconomic History  . Marital status: Married    Spouse name: Not on file  . Number of children: Not on file  . Years of education: Not on file  . Highest education level: Not on file  Occupational History  . Not on file  Social Needs  . Financial resource strain: Not on file   . Food insecurity:    Worry: Not on file    Inability: Not on file  . Transportation needs:    Medical: Not on file    Non-medical: Not on file  Tobacco Use  . Smoking status: Former Smoker    Last attempt to quit: 10/12/2007    Years since quitting: 10.3  . Smokeless tobacco: Never Used  Substance and Sexual Activity  . Alcohol use: Yes    Alcohol/week: 1.0 standard drinks    Types: 1 Standard drinks or equivalent per week    Comment: with dinner  . Drug use: No  . Sexual activity: Not on file  Lifestyle  . Physical activity:    Days per week: Not on file    Minutes per session: Not on file  . Stress: Not on file  Relationships  . Social connections:    Talks on phone: Not on file    Gets together: Not on file    Attends religious service: Not on file    Active member of club or organization: Not on file    Attends meetings of clubs or organizations: Not on file    Relationship status: Not on file  . Intimate partner violence:    Fear of current or ex partner: Not on file    Emotionally abused: Not on file    Physically abused: Not on file    Forced sexual activity: Not on file  Other Topics Concern  . Not on file  Social History Narrative   Lives in [place] with husband. 2 children, son lives nearby.      Diet - regular      Exercise - none   Past Surgical History:  Procedure Laterality Date  . BREAST BIOPSY Right 05/2014   Dr. Dwyane Luo office-benign  . BREAST SURGERY Right February 2016   Vacuum assisted biopsy for recurrent cyst, fibrocystic changes without evidence of malignancy.  Marland Kitchen CATARACT EXTRACTION W/PHACO Left 01/13/2015   Procedure: CATARACT EXTRACTION PHACO AND INTRAOCULAR LENS PLACEMENT (IOC);  Surgeon: Birder Robson, MD;  Location: [facility] ORS;  Service: Ophthalmology;  Laterality: Left;  [place]  1:02.9 AP   19.2 CDE  12.06 casette lot #  [id number]  . CATARACT EXTRACTION W/PHACO Right 02/03/2015   Procedure: CATARACT EXTRACTION PHACO AND  INTRAOCULAR LENS PLACEMENT ([place]);  Surgeon: Birder Robson, MD;  Location: [facility] ORS;  Service: Ophthalmology;  Laterality: Right;  [id number] ap46.5 [id number]  . COLECTOMY    . COLONOSCOPY  2015   Dr Candace Cruise  . COLONOSCOPY WITH PROPOFOL N/A 09/13/2017   Procedure: COLONOSCOPY WITH PROPOFOL;  Surgeon: Robert Bellow, MD;  Location: [facility] ENDOSCOPY;  Service: Endoscopy;  Laterality: N/A;  . COLONOSCOPY WITH PROPOFOL N/A 11/08/2017   Procedure: COLONOSCOPY WITH PROPOFOL;  Surgeon: Robert Bellow, MD;  Location: [facility] ENDOSCOPY;  Service: Endoscopy;  Laterality: N/A;  . colostomy reversal    . DILATION AND CURETTAGE OF UTERUS  2014   Dr. Sabra Heck, benign per pt  . EYE SURGERY    . HERNIA REPAIR  07/13/12   ventral   . TONSILLECTOMY     Family History  Problem Relation Age of Onset  . Stroke Mother   . Diabetes Mother   . Cancer Father        colon  . Cancer Brother        colon  . Cancer Brother        brain   Allergies  Allergen Reactions  . Sulfa Antibiotics Hives      Assessment & Plan:  Presents as a new patient referred by Dr. Caryl Bis for evaluation of painful varicose veins and edema.  The patient endorses a long-standing history of painful varicose veins however has noticed they have increased in number and discomfort over the past 3 years.  So experiences edema to the bilateral legs.  The patient has noticed a worsening in the amount of edema she experiences as well.  Both the patient's discomfort and edema worsen with sitting and standing for long periods of time.  The patient also notes that her symptoms worsen towards the end of the day.  The patient denies any recent surgery or trauma to the bilateral legs.  The patient denies any DVT history or recurrent recent bouts of cellulitis.  This time, the patient does not engage in conservative therapy including wearing medical grade 1 compression socks, elevating her legs and remaining active on a daily basis.  Patient does feel  that her symptoms have progressed to the point that she is unable to function on a daily basis and they have become lifestyle limiting.  She denies any claudication-like symptoms, rest pain or ulcer formation to the bilateral legs.  The patient denies any fever, nausea vomiting.  1. Varicose veins of bilateral lower extremities with other complications - New The patient was encouraged to wear graduated compression stockings (20-30 mmHg) on a daily basis. The patient was instructed to begin wearing the stockings first thing in the morning and removing them in the evening. The patient was instructed specifically not to sleep in the stockings. Prescription given In addition, behavioral modification including elevation during the day will be initiated. Anti-inflammatories for pain. I will bring the patient back and have her undergo bilateral lower extremity venous duplex to rule out any contributing venous versus lymphatic disease The patient will follow up in three months to asses conservative management.  Information on chronic venous insufficiency and compression stockings was given to the patient. The patient was instructed to call the office in the interim if any worsening edema or ulcerations to the legs, feet or toes occurs. The patient expresses their understanding.  - VAS [place] LOWER EXTREMITY VENOUS REFLUX; Future  2. Bilateral lower extremity edema - New As above  - VAS [place] LOWER EXTREMITY VENOUS REFLUX; Future  Current Outpatient Medications on File Prior to Visit  Medication Sig Dispense Refill  . atorvastatin (LIPITOR) 10 MG tablet Take 1 tablet (10 mg total) by mouth daily. 90 tablet 3  . azelastine (ASTELIN) 0.1 % nasal spray Place 2 sprays into both nostrils 2 (two) times daily. Use in each nostril as directed 30 mL 12  . fluticasone (FLONASE) 50 MCG/ACT nasal spray Place 2 sprays into both nostrils daily. 16 g 6  . losartan (COZAAR) 100 MG tablet TAKE ONE TABLET  BY MOUTH DAILY 90  tablet 1  . Omega-3 Fatty Acids (FISH OIL PO) Take by mouth.     No current facility-administered medications on file prior to visit.    There are no Patient Instructions on file for this visit. No follow-ups on file.  Raeann Offner A Kieran Nachtigal, PA-C

## 2018-05-09 ENCOUNTER — Encounter: Payer: Self-pay | Admitting: Family Medicine

## 2018-05-09 ENCOUNTER — Ambulatory Visit: Payer: Medicare Other | Admitting: Family Medicine

## 2018-05-09 DIAGNOSIS — E785 Hyperlipidemia, unspecified: Secondary | ICD-10-CM | POA: Diagnosis not present

## 2018-05-09 DIAGNOSIS — I10 Essential (primary) hypertension: Secondary | ICD-10-CM | POA: Diagnosis not present

## 2018-05-09 DIAGNOSIS — R7303 Prediabetes: Secondary | ICD-10-CM | POA: Diagnosis not present

## 2018-05-09 NOTE — Patient Instructions (Signed)
Nice to see you. Please periodically monitor your blood pressure.  If it starts to go consistently above 140/90 please let us know.

## 2018-05-09 NOTE — Progress Notes (Signed)
  Tommi Rumps, MD Phone: [phone]  Sabrina Wilson is a 78 y.o. female who presents today for follow-up.  CC: Hypertension, hyperlipidemia, prediabetes  Hypertension: She is checking at home.  Typically is been between 176 and 160 systolically.  Excursion to 737 systolically.  10G to 26R diastolically with excursion to 90s.  Mostly well controlled.  No chest pain, shortness of breath, or edema.  Hyperlipidemia: Taking Lipitor.  No right upper quadrant pain or myalgias.  Prediabetes: No polyuria or polydipsia.  She is try to stay active though no explicit exercise.  She tries to eat healthy.  Eats lots of vegetables.  No soda.  She sweetened her sweet tea with Stevia.  Social History   Tobacco Use  Smoking Status Former Smoker  . Last attempt to quit: 10/12/2007  . Years since quitting: 10.5  Smokeless Tobacco Never Used     ROS see history of present illness  Objective  Physical Exam Vitals:   05/09/18 1426  BP: 138/80  Pulse: 82  Temp: 98.2 F (36.8 C)  SpO2: 97%    BP Readings from Last 3 Encounters:  05/09/18 138/80  02/13/18 (!) 146/86  02/05/18 140/90   Wt Readings from Last 3 Encounters:  05/09/18 195 lb 3.2 oz (88.5 kg)  02/13/18 192 lb (87.1 kg)  01/31/18 193 lb 9.6 oz (87.8 kg)    Physical Exam Constitutional:      General: She is not in acute distress.    Appearance: She is not diaphoretic.  Cardiovascular:     Rate and Rhythm: Normal rate and regular rhythm.     Heart sounds: Normal heart sounds.  Pulmonary:     Effort: Pulmonary effort is normal.     Breath sounds: Normal breath sounds.  Musculoskeletal:     Right lower leg: No edema.     Left lower leg: No edema.  Skin:    General: Skin is warm and dry.  Neurological:     Mental Status: She is alert.      Assessment/Plan: Please see individual problem list.  Prediabetes Encouraged increasing activity.  Continue to monitor diet.  Hypertension Improved.  She will continue  to monitor at home.  Continue losartan.  Hyperlipidemia Continue Lipitor.    No orders of the defined types were placed in this encounter.   No orders of the defined types were placed in this encounter.    Tommi Rumps, MD [place]

## 2018-05-09 NOTE — Assessment & Plan Note (Signed)
Encouraged increasing activity.  Continue to monitor diet.

## 2018-05-09 NOTE — Assessment & Plan Note (Signed)
-  Continue Lipitor °

## 2018-05-09 NOTE — Assessment & Plan Note (Signed)
Improved.  She will continue to monitor at home.  Continue losartan.

## 2018-05-14 IMAGING — MG MM DIGITAL DIAGNOSTIC BILAT W/ TOMO W/ CAD
8 of 15 series · 8 of 35 positions shown · non-contrast
Comparison: Previous exam(s).

CLINICAL DATA: Patient presents for a bilateral diagnostic
examination due to a focal area of tenderness over the mid to inner
aspect of the right inframammary fold on exam. No focal palpable

EXAM:
2D DIGITAL DIAGNOSTIC bilateral MAMMOGRAM WITH CAD AND ADJUNCT TOMO
ULTRASOUND right BREAST

[L MLO synth-2D]
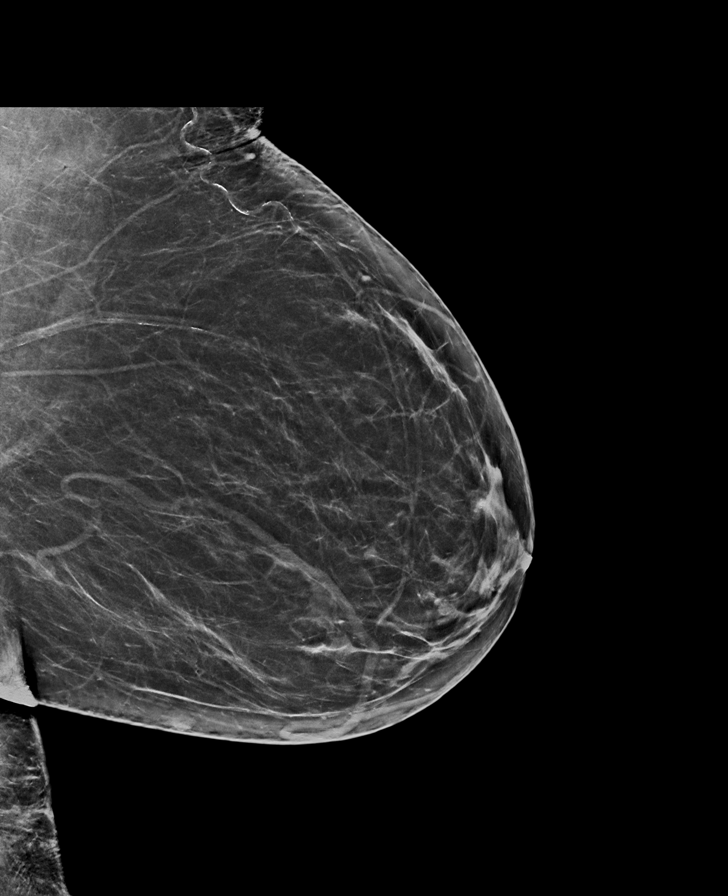

[R MLO synth-2D (1 of 2)]
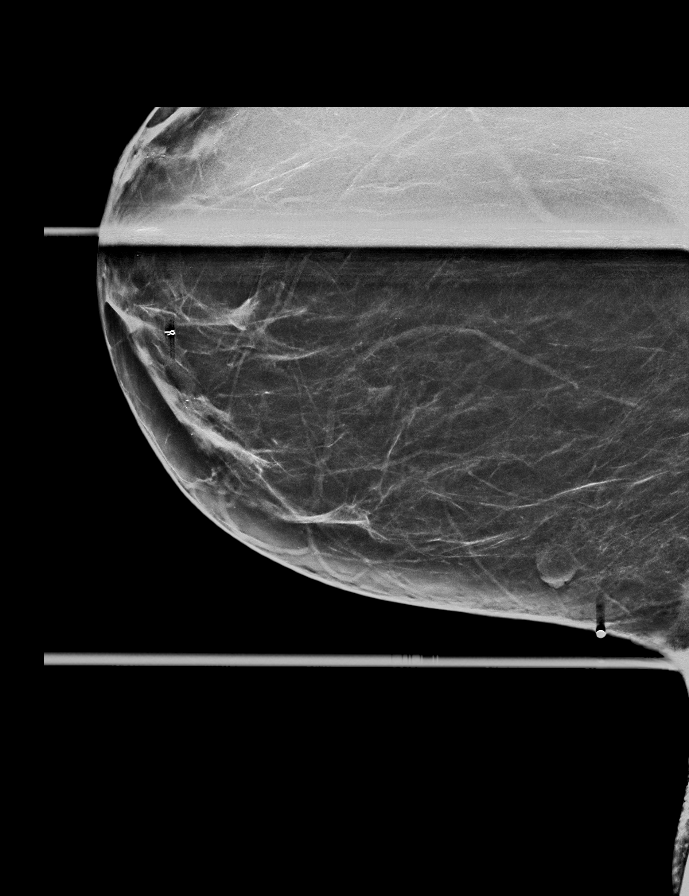

[L CC synth-2D]
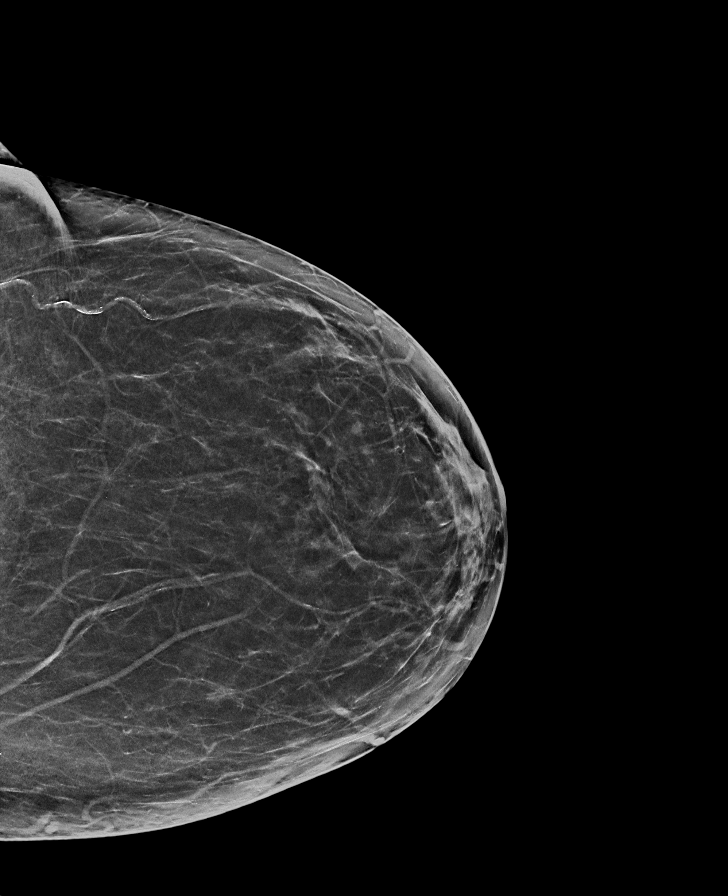

[L MLO]
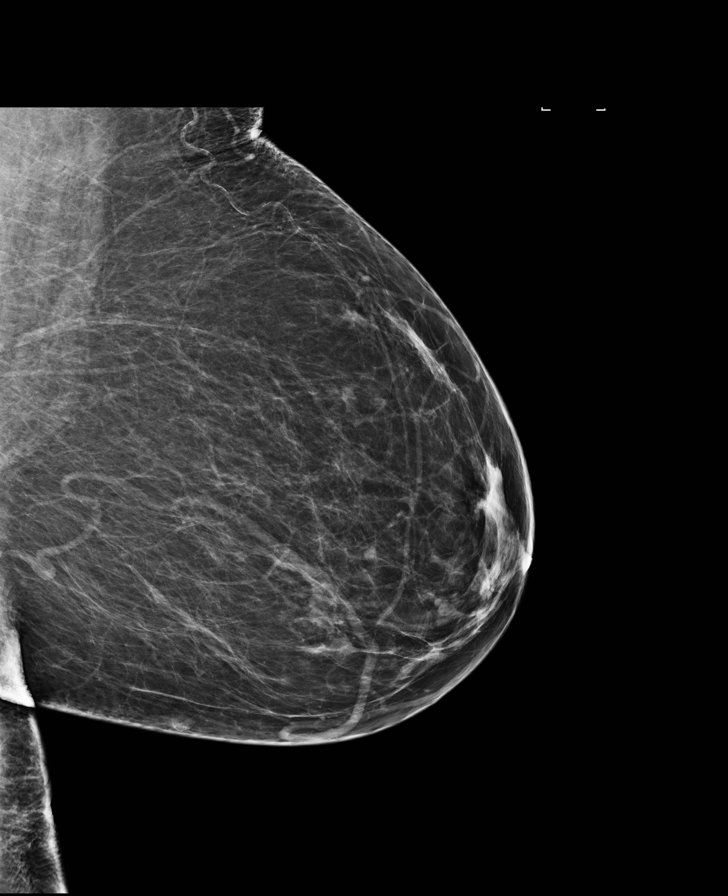

[R CC synth-2D]
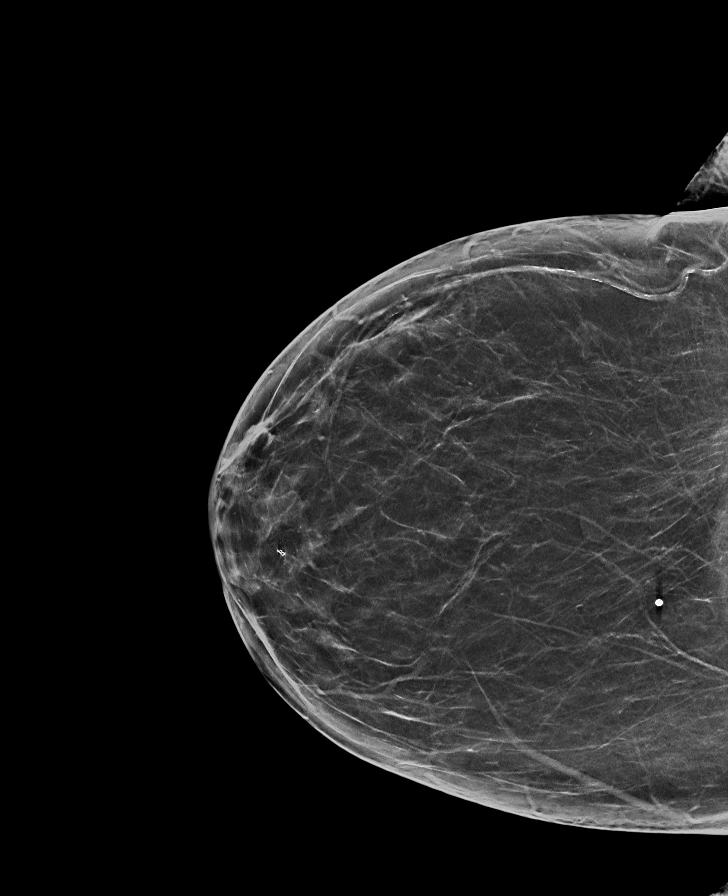

[L CC]
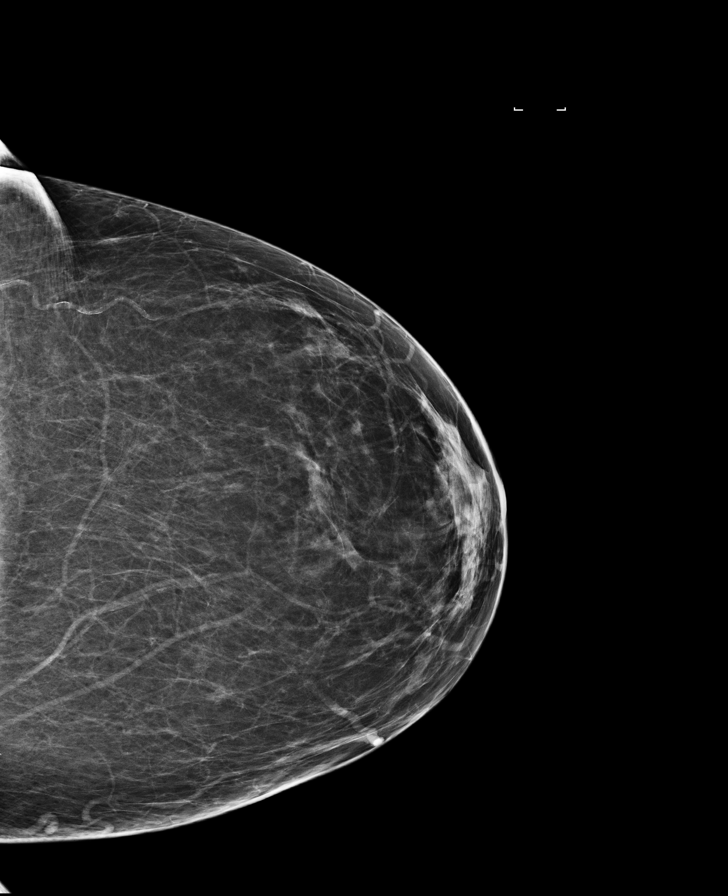

[R MLO synth-2D (2 of 2)]
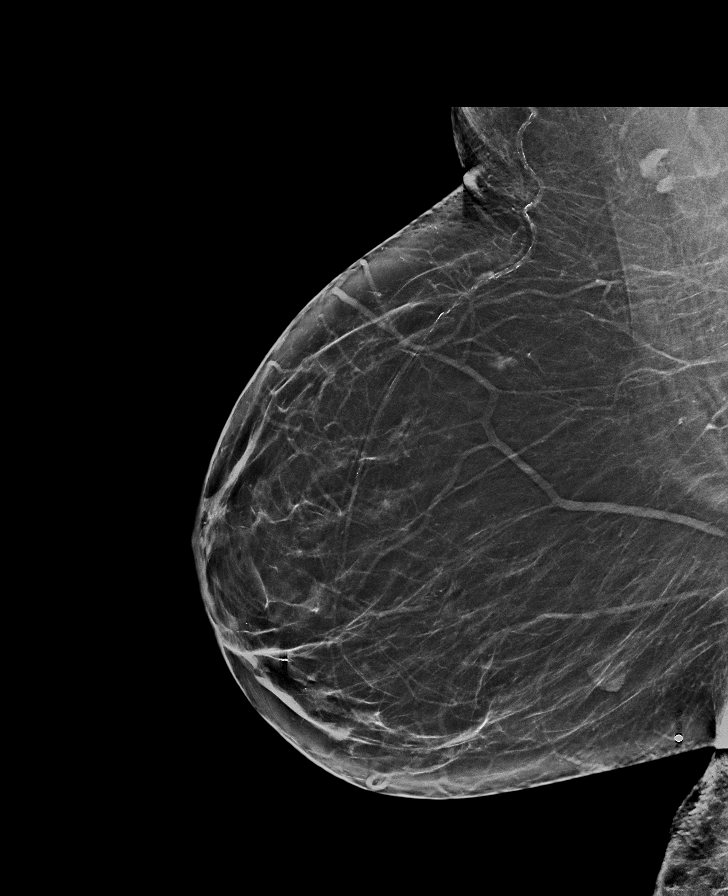

[R CC]
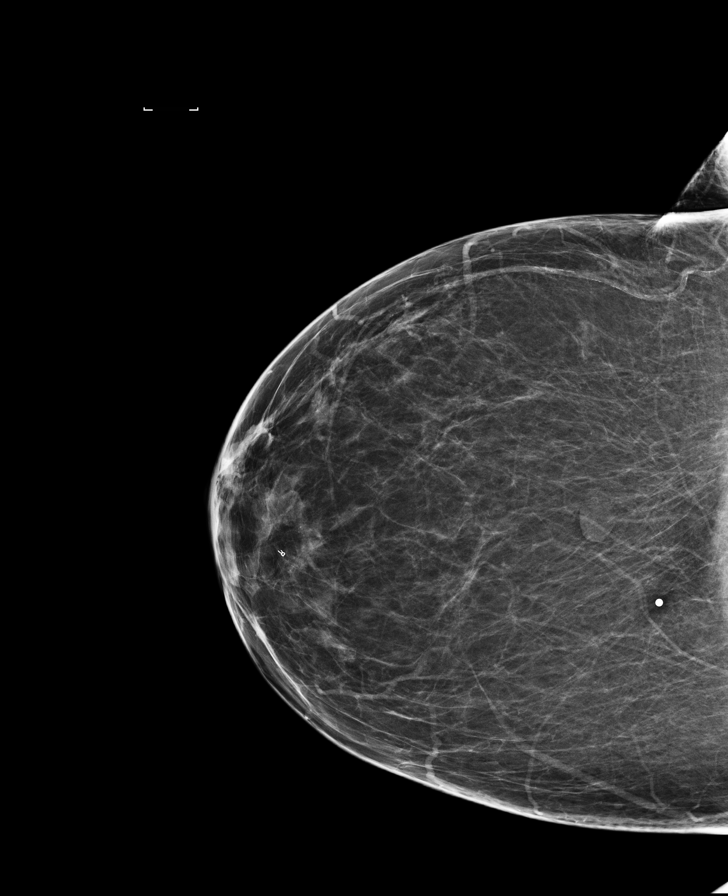

[8 of 35 positions shown; findings below may reference images not displayed]

ACR Breast Density Category b: There are scattered areas of
fibroglandular density.
FINDINGS: Examination demonstrates no focal abnormality over the lower mid to
inner aspect of the right inframammary fold to account for patient's
focal tenderness. Remainder of the exam is unchanged.

Mammographic images were processed with CAD.

Targeted ultrasound is performed, showing no focal abnormality over
the right inframammary fold to account for patient's focal
tenderness.
IMPRESSION: No focal abnormality over the mid to inner aspect of the right
inframammary fold to account for patient's focal tenderness.
Otherwise, stable mammogram.

RECOMMENDATION:
Recommend continued management of patient's right breast focal
tenderness on a clinical basis. Otherwise, recommend continued
annual bilateral screening mammographic followup.

I have discussed the findings and recommendations with the patient.
Results were also provided in writing at the conclusion of the
visit. If applicable, a reminder letter will be sent to the patient
regarding the next appointment.

BI-RADS CATEGORY  1: Negative.

## 2018-05-18 ENCOUNTER — Ambulatory Visit (INDEPENDENT_AMBULATORY_CARE_PROVIDER_SITE_OTHER): Payer: Medicare Other

## 2018-05-18 ENCOUNTER — Ambulatory Visit (INDEPENDENT_AMBULATORY_CARE_PROVIDER_SITE_OTHER): Payer: Medicare Other | Admitting: Vascular Surgery

## 2018-05-18 ENCOUNTER — Encounter (INDEPENDENT_AMBULATORY_CARE_PROVIDER_SITE_OTHER): Payer: Self-pay | Admitting: Vascular Surgery

## 2018-05-18 VITALS — BP 175/75 | HR 70 | Resp 16 | Ht 66.5 in | Wt 192.6 lb

## 2018-05-18 DIAGNOSIS — R600 Localized edema: Secondary | ICD-10-CM

## 2018-05-18 DIAGNOSIS — I83893 Varicose veins of bilateral lower extremities with other complications: Secondary | ICD-10-CM

## 2018-05-18 DIAGNOSIS — I10 Essential (primary) hypertension: Secondary | ICD-10-CM

## 2018-05-18 NOTE — Patient Instructions (Signed)
Sclerotherapy  Sclerotherapy is a procedure that is done to improve the appearance of varicose veins and spider veins and to help relieve aching, swelling, cramping, and pain in the legs. Varicose veins are veins that have become enlarged, bulging, and twisted due to a damaged valve that causes blood to collect (pool) in the veins. Spider veins are small varicose veins. Sclerotherapy usually works best for smaller spider and varicose veins. This procedure involves injecting a chemical into the vein to close it off. You may need more than one treatment to close a vein all the way. Sclerotherapy is usually performed on the legs because that is where varicose and spider veins most often occur. Tell a health care provider about:  Any allergies you have.  All medicines you are taking, including vitamins, herbs, eye drops, creams, and over-the-counter medicines.  Any blood disorders you have.  Any surgeries you have had.  Any medical conditions you have.  Whether you are pregnant or may be pregnant. What are the risks? Generally, this is a safe procedure. However, problems may occur, including:  Infection.  Bleeding.  Allergic reactions to medicines or dyes.  Blood clots.  Nerve damage.  Bruising and scarring.  Darkened skin around the area. What happens before the procedure?  Do not use lotions or creams on your legs unless your health care provider approves.  Follow instructions from your health care provider about eating and drinking restrictions.  Do not use any products that contain nicotine or tobacco, such as cigarettes and e-cigarettes. If you need help quitting, ask your health care provider.  Ask your health care provider about: ? Changing or stopping your regular medicines. This is especially important if you are taking diabetes medicines or blood thinners. ? Taking medicines such as aspirin and ibuprofen. These medicines can thin your blood. Do not take these  medicines before your procedure if your health care provider instructs you not to.  You may have an ultrasound of the affected area to check for blood clots and to check blood flow.  In rare cases, you may have an X-ray procedure to check how blood flows through your veins (angiogram). For an angiogram, a dye is injected to outline your veins on X-rays. What happens during the procedure?  To lower your risk of infection: ? Your health care team will wash or sanitize their hands. ? Your skin will be washed with soap. ? Hair may be removed from the treatment area.  A small, thin needle will be used to inject a chemical (sclerosant) into your varicose vein. The sclerosant will irritate the lining of the vein and cause the vein to close below the injection site. You may feel some stinging, burning, or irritation.  The injection may be repeated for more than one varicose vein.  The injection area will be wrapped with elastic bandages. The procedure may vary among health care providers and hospitals. What happens after the procedure?  Your injection area will be wrapped with elastic bandages. If there is bleeding, the bandages may be changed.  Do not drive until your health care provider approves. You may need to wait 1-2 days before driving.  You will need to wear compression stockings for about a week, or as long as your health care provider recommends. Summary  Sclerotherapy is a procedure that is done to improve the appearance of varicose veins and spider veins and to help relieve aching, swelling, cramping, and pain in the legs.  A small, thin needle is   used to inject a chemical (sclerosant) into a spider vein or varicose vein to close it off.  Elastic bandages will be wrapped around the injection area after the procedure. This information is not intended to replace advice given to you by your health care provider. Make sure you discuss any questions you have with your health care  provider. Document Released: 05/31/2016 Document Revised: 05/31/2016 Document Reviewed: 05/31/2016 Elsevier Interactive Patient Education  2019 [employer]  

## 2018-05-18 NOTE — Progress Notes (Signed)
Patient ID: Sabrina Wilson, female   DOB: [birth year], 78 y.o.   MRN: [id number]  Chief Complaint  Patient presents with  . Follow-up    HPI Sabrina Wilson is a 78 y.o. female.  Patient returns in follow up of their venous disease.  They have done their best to comply with the prescribed conservative therapies of compression stockings, leg elevation, exercise, and still requires anti-inflammatories for discomfort and has symptoms that are persistent and bothersome on a daily basis, affecting their activities of daily living and normal activities.  The symptoms involve increasing in the prominence of the superficial varicosities as well as pain, itching, and burning overlying the varicosities.  The left leg may be a little worse than the right.  The venous reflux study demonstrates no DVT or superficial thrombophlebitis.  There was no significant venous reflux seen in the right lower extremity with reflux seen in the common femoral vein in the left lower extremity.    Past Medical History:  Diagnosis Date  . Arthritis   . Colon cancer [facility]) 2009   T3,N0; s/p resection  Dr. Hulda Humphrey and chemotherapy by Dr. Cynda Acres  . Hernia of flank   . Hypertension   . Neuropathy   . Polyp at cervical os    s/p resection Dr. Sabra Heck    Past Surgical History:  Procedure Laterality Date  . BREAST BIOPSY Right 05/2014   Dr. Dwyane Luo office-benign  . BREAST SURGERY Right February 2016   Vacuum assisted biopsy for recurrent cyst, fibrocystic changes without evidence of malignancy.  Marland Kitchen CATARACT EXTRACTION W/PHACO Left 01/13/2015   Procedure: CATARACT EXTRACTION PHACO AND INTRAOCULAR LENS PLACEMENT (IOC);  Surgeon: Birder Robson, MD;  Location: [facility] ORS;  Service: Ophthalmology;  Laterality: Left;  [place]  1:02.9 AP   19.2 CDE  12.06 casette lot #  [id number]  . CATARACT EXTRACTION W/PHACO Right 02/03/2015   Procedure: CATARACT EXTRACTION PHACO AND INTRAOCULAR LENS PLACEMENT ([place]);  Surgeon: Birder Robson,  MD;  Location: [facility] ORS;  Service: Ophthalmology;  Laterality: Right;  us00:53 ap46.5 [id number]  . COLECTOMY    . COLONOSCOPY  2015   Dr Candace Cruise  . COLONOSCOPY WITH PROPOFOL N/A 09/13/2017   Procedure: COLONOSCOPY WITH PROPOFOL;  Surgeon: Robert Bellow, MD;  Location: [facility] ENDOSCOPY;  Service: Endoscopy;  Laterality: N/A;  . COLONOSCOPY WITH PROPOFOL N/A 11/08/2017   Procedure: COLONOSCOPY WITH PROPOFOL;  Surgeon: Robert Bellow, MD;  Location: [facility] ENDOSCOPY;  Service: Endoscopy;  Laterality: N/A;  . colostomy reversal    . DILATION AND CURETTAGE OF UTERUS  2014   Dr. Sabra Heck, benign per pt  . EYE SURGERY    . HERNIA REPAIR  07/13/12   ventral   . TONSILLECTOMY      Family History  Problem Relation Age of Onset  . Stroke Mother   . Diabetes Mother   . Cancer Father        colon  . Cancer Brother        colon  . Cancer Brother        brain    Social History Social History   Tobacco Use  . Smoking status: Former Smoker    Last attempt to quit: 10/12/2007    Years since quitting: 10.6  . Smokeless tobacco: Never Used  Substance Use Topics  . Alcohol use: Yes    Alcohol/week: 1.0 standard drinks    Types: 1 Standard drinks or equivalent per week    Comment: with dinner  .  Drug use: No    Allergies  Allergen Reactions  . Sulfa Antibiotics Hives    Current Outpatient Medications  Medication Sig Dispense Refill  . atorvastatin (LIPITOR) 10 MG tablet Take 1 tablet (10 mg total) by mouth daily. 90 tablet 3  . azelastine (ASTELIN) 0.1 % nasal spray Place 2 sprays into both nostrils 2 (two) times daily. Use in each nostril as directed 30 mL 12  . fluticasone (FLONASE) 50 MCG/ACT nasal spray Place 2 sprays into both nostrils daily. 16 g 6  . losartan (COZAAR) 100 MG tablet TAKE ONE TABLET BY MOUTH DAILY 90 tablet 1  . Omega-3 Fatty Acids (FISH OIL PO) Take by mouth.     No current facility-administered medications for this visit.       REVIEW OF SYSTEMS (Negative  unless checked)  Constitutional: [] Weight loss  [] Fever  [] Chills Cardiac: [] Chest pain   [] Chest pressure   [] Palpitations   [] Shortness of breath when laying flat   [] Shortness of breath at rest   [] Shortness of breath with exertion. Vascular:  [] Pain in legs with walking   [] Pain in legs at rest   [] Pain in legs when laying flat   [] Claudication   [] Pain in feet when walking  [] Pain in feet at rest  [] Pain in feet when laying flat   [] History of DVT   [] Phlebitis   [x] Swelling in legs   [x] Varicose veins   [] Non-healing ulcers Pulmonary:   [] Uses home oxygen   [] Productive cough   [] Hemoptysis   [] Wheeze  [] COPD   [] Asthma Neurologic:  [] Dizziness  [] Blackouts   [] Seizures   [] History of stroke   [] History of TIA  [] Aphasia   [] Temporary blindness   [] Dysphagia   [] Weakness or numbness in arms   [] Weakness or numbness in legs Musculoskeletal:  [x] Arthritis   [] Joint swelling   [x] Joint pain   [] Low back pain Hematologic:  [] Easy bruising  [] Easy bleeding   [] Hypercoagulable state   [] Anemic  [] Hepatitis Gastrointestinal:  [] Blood in stool   [] Vomiting blood  [] Gastroesophageal reflux/heartburn   [] Abdominal pain Genitourinary:  [] Chronic kidney disease   [] Difficult urination  [] Frequent urination  [] Burning with urination   [] Hematuria Skin:  [] Rashes   [] Ulcers   [] Wounds Psychological:  [] History of anxiety   []  History of major depression.    Physical Exam BP (!) 175/75 (BP Location: Left Arm, Patient Position: Sitting, Cuff Size: Normal)   Pulse 70   Resp 16   Ht 5' 6.5" (1.689 m)   Wt 192 lb 9.6 oz (87.4 kg)   BMI 30.62 kg/m  Gen:  WD/WN, NAD. Appears younger than stated age. Head: [place]/AT, No temporalis wasting.  Ear/Nose/Throat: Hearing grossly intact, dentition good Eyes: Sclera non-icteric. Conjunctiva clear Neck: Supple. Trachea midline Pulmonary:  Good air movement, no use of accessory muscles, respirations not labored.  Cardiac: RRR, No JVD Vascular: Varicosities  scattered and measuring up to 1-2 mm in the right lower extremity        Varicosities diffuse and measuring up to 2 mm in the left lower extremity Vessel Right Left  Radial Palpable Palpable                          PT Palpable Palpable  DP Palpable Palpable    Musculoskeletal: M/S 5/5 throughout.  No significant LE edema Neurologic: Sensation grossly intact in extremities.  Symmetrical.  Speech is fluent.  Psychiatric: Judgment intact, Mood & affect appropriate for  pt's clinical situation. Dermatologic: No rashes or ulcers noted.  No cellulitis or open wounds.  Radiology No results found.  Labs No results found for this or any previous visit (from the past 2160 hour(s)).  Assessment/Plan:  Hypertension blood pressure control important in reducing the progression of atherosclerotic disease. On appropriate oral medications.   Varicose veins of bilateral lower extremities with other complications    The patient has done their best to comply with conservative therapy of 20-30 mm Hg compression stockings, leg elevation, exercise, and anti-inflammatories as needed for discomfort.  Despite this, they continue to have daily and persistent symptoms from their venous disease.  A venous reflux study demonstrates no significant DVT or superficial thrombophlebitis with only deep venous reflux seen on the left.  No superficial venous reflux was seen on either side so laser ablation would not be of much benefit.  Sclerotherapy would be an option to treat the painful superficial varicosities on both lower extremities.  The patient voices their understanding and is agreeable to proceed with sclerotherapy.     Leotis Pain 05/18/2018, 2:11 PM

## 2018-05-18 NOTE — Assessment & Plan Note (Signed)
blood pressure control important in reducing the progression of atherosclerotic disease. On appropriate oral medications.  

## 2018-06-12 ENCOUNTER — Other Ambulatory Visit: Payer: Self-pay | Admitting: Family Medicine

## 2018-06-12 DIAGNOSIS — Z1231 Encounter for screening mammogram for malignant neoplasm of breast: Secondary | ICD-10-CM

## 2018-06-26 ENCOUNTER — Encounter (INDEPENDENT_AMBULATORY_CARE_PROVIDER_SITE_OTHER): Payer: Self-pay | Admitting: Nurse Practitioner

## 2018-06-26 ENCOUNTER — Ambulatory Visit (INDEPENDENT_AMBULATORY_CARE_PROVIDER_SITE_OTHER): Payer: Medicare Other | Admitting: Nurse Practitioner

## 2018-06-26 VITALS — BP 156/87 | HR 69 | Resp 12 | Ht 67.0 in | Wt 194.0 lb

## 2018-06-26 DIAGNOSIS — I83893 Varicose veins of bilateral lower extremities with other complications: Secondary | ICD-10-CM

## 2018-06-27 ENCOUNTER — Ambulatory Visit
Admission: RE | Admit: 2018-06-27 | Discharge: 2018-06-27 | Disposition: A | Discharge status: Home / Self Care | Payer: Medicare Other | Source: Ambulatory Visit | Attending: Family Medicine | Admitting: Family Medicine

## 2018-06-27 DIAGNOSIS — Z1231 Encounter for screening mammogram for malignant neoplasm of breast: Secondary | ICD-10-CM | POA: Diagnosis not present

## 2018-06-27 NOTE — Progress Notes (Signed)
Varicose veins of left lower extremity with inflammation (454.1  I83.10) Current Plans   Indication: Patient presents with symptomatic varicose veins of the left lower extremity.   Procedure: Sclerotherapy using hypertonic saline mixed with 1% Lidocaine was performed on the left lower extremity. Compression wraps were placed. The patient tolerated the procedure well. 

## 2018-07-13 ENCOUNTER — Other Ambulatory Visit: Payer: Self-pay | Admitting: Family Medicine

## 2018-07-13 DIAGNOSIS — E785 Hyperlipidemia, unspecified: Secondary | ICD-10-CM

## 2018-07-17 ENCOUNTER — Ambulatory Visit (INDEPENDENT_AMBULATORY_CARE_PROVIDER_SITE_OTHER): Payer: Medicare Other | Admitting: Nurse Practitioner

## 2018-08-10 ENCOUNTER — Telehealth: Payer: Self-pay | Admitting: Family Medicine

## 2018-08-10 ENCOUNTER — Ambulatory Visit (INDEPENDENT_AMBULATORY_CARE_PROVIDER_SITE_OTHER): Payer: Medicare Other | Admitting: Family Medicine

## 2018-08-10 ENCOUNTER — Encounter: Payer: Self-pay | Admitting: Family Medicine

## 2018-08-10 ENCOUNTER — Other Ambulatory Visit: Payer: Self-pay

## 2018-08-10 DIAGNOSIS — R7303 Prediabetes: Secondary | ICD-10-CM | POA: Diagnosis not present

## 2018-08-10 DIAGNOSIS — L989 Disorder of the skin and subcutaneous tissue, unspecified: Secondary | ICD-10-CM | POA: Diagnosis not present

## 2018-08-10 DIAGNOSIS — I10 Essential (primary) hypertension: Secondary | ICD-10-CM | POA: Diagnosis not present

## 2018-08-10 DIAGNOSIS — G629 Polyneuropathy, unspecified: Secondary | ICD-10-CM | POA: Diagnosis not present

## 2018-08-10 DIAGNOSIS — E785 Hyperlipidemia, unspecified: Secondary | ICD-10-CM

## 2018-08-10 NOTE — Assessment & Plan Note (Signed)
She will check her blood pressure daily for the next week and send [place] a list of her readings.  We will have her follow-up in 3 to 4 months.  We will check lab work.  She will continue losartan.

## 2018-08-10 NOTE — Progress Notes (Signed)
Virtual Visit via video Note  This visit type was conducted due to national recommendations for restrictions regarding the COVID-19 pandemic (e.g. social distancing).  This format is felt to be most appropriate for this patient at this time.  All issues noted in this document were discussed and addressed.  No physical exam was performed (except for noted visual exam findings with Video Visits).   I connected with Sabrina Wilson on 08/10/18 at  2:15 PM EDT by a video enabled telemedicine application and verified that I am speaking with the correct person using two identifiers. Location patient: home Location provider: work Persons participating in the virtual visit: patient, provider  I discussed the limitations, risks, security and privacy concerns of performing an evaluation and management service by video and the availability of in person appointments. I also discussed with the patient that there may be a patient responsible charge related to this service. The patient expressed understanding and agreed to proceed.  Reason for visit: follow-up  HPI: HYPERTENSION  Disease Monitoring  Home BP Monitoring typically running between in 119 and 137/58-80, excursions into the 614E to 315Q systolically. Chest pain-no.    Dyspnea-no. Medications  Compliance-taking losartan. Lightheadedness-no. Edema-no.  Hyperlipidemia: Taking Lipitor.  No right upper quadrant pain or myalgias.  Neuropathy: She notes tingling in her fingertips.  She has numbness in her bilateral feet.  She has not had no pain.  She notes this has been present since she had chemotherapy 10 years ago.  She tried gabapentin and Lyrica with no benefit.  This has not changed or worsened.  Nevus: Patient notes she got a spot behind her left knee right at the bend of her knee.  She reports it feels like a mole though she cannot see it.  She does want to see dermatology though she wants to hold off on a referral until after the COVID-19 issues  die down.   ROS: See pertinent positives and negatives per HPI.  Past Medical History:  Diagnosis Date  . Arthritis   . Colon cancer [facility]) 2009   T3,N0; s/p resection  Dr. Hulda Humphrey and chemotherapy by Dr. Cynda Acres  . Hernia of flank   . Hypertension   . Neuropathy   . Polyp at cervical os    s/p resection Dr. Sabra Heck    Past Surgical History:  Procedure Laterality Date  . BREAST BIOPSY Right 05/2014   Dr. Dwyane Luo office-benign  . BREAST SURGERY Right February 2016   Vacuum assisted biopsy for recurrent cyst, fibrocystic changes without evidence of malignancy.  Marland Kitchen CATARACT EXTRACTION W/PHACO Left 01/13/2015   Procedure: CATARACT EXTRACTION PHACO AND INTRAOCULAR LENS PLACEMENT (IOC);  Surgeon: Birder Robson, MD;  Location: [facility] ORS;  Service: Ophthalmology;  Laterality: Left;  [place]  1:02.9 AP   19.2 CDE  12.06 casette lot #  [id number]  . CATARACT EXTRACTION W/PHACO Right 02/03/2015   Procedure: CATARACT EXTRACTION PHACO AND INTRAOCULAR LENS PLACEMENT ([place]);  Surgeon: Birder Robson, MD;  Location: [facility] ORS;  Service: Ophthalmology;  Laterality: Right;  us00:53 ap46.5 cde10.79  . COLECTOMY    . COLONOSCOPY  2015   Dr Candace Cruise  . COLONOSCOPY WITH PROPOFOL N/A 09/13/2017   Procedure: COLONOSCOPY WITH PROPOFOL;  Surgeon: Robert Bellow, MD;  Location: [facility] ENDOSCOPY;  Service: Endoscopy;  Laterality: N/A;  . COLONOSCOPY WITH PROPOFOL N/A 11/08/2017   Procedure: COLONOSCOPY WITH PROPOFOL;  Surgeon: Robert Bellow, MD;  Location: [facility] ENDOSCOPY;  Service: Endoscopy;  Laterality: N/A;  . colostomy reversal    .  DILATION AND CURETTAGE OF UTERUS  2014   Dr. Sabra Heck, benign per pt  . EYE SURGERY    . HERNIA REPAIR  07/13/12   ventral   . TONSILLECTOMY      Family History  Problem Relation Age of Onset  . Stroke Mother   . Diabetes Mother   . Cancer Father        colon  . Cancer Brother        colon  . Cancer Brother        brain    SOCIAL HX: Former smoker.   Current  Outpatient Medications:  .  atorvastatin (LIPITOR) 10 MG tablet, TAKE ONE TABLET BY MOUTH DAILY, Disp: 90 tablet, Rfl: 2 .  azelastine (ASTELIN) 0.1 % nasal spray, Place 2 sprays into both nostrils 2 (two) times daily. Use in each nostril as directed, Disp: 30 mL, Rfl: 12 .  fluticasone (FLONASE) 50 MCG/ACT nasal spray, Place 2 sprays into both nostrils daily., Disp: 16 g, Rfl: 6 .  losartan (COZAAR) 100 MG tablet, TAKE ONE TABLET BY MOUTH DAILY, Disp: 90 tablet, Rfl: 1 .  Omega-3 Fatty Acids (FISH OIL PO), Take by mouth., Disp: , Rfl:  .  vitamin A 10000 UNIT capsule, Take by mouth., Disp: , Rfl:   EXAM:  VITALS per patient if applicable: None.  GENERAL: alert, oriented, appears well and in no acute distress  HEENT: atraumatic, conjunttiva clear, no obvious abnormalities on inspection of external nose and ears  NECK: normal movements of the head and neck  LUNGS: on inspection no signs of respiratory distress, breathing rate appears normal, no obvious gross SOB, gasping or wheezing  CV: no obvious cyanosis  MS: moves all visible extremities without noticeable abnormality  PSYCH/NEURO: pleasant and cooperative, no obvious depression or anxiety, speech and thought processing grossly intact  ASSESSMENT AND PLAN:  Discussed the following assessment and plan:  Prediabetes - Plan: Hemoglobin A1c  Skin lesion  Essential hypertension - Plan: Comp Met (CMET)  Neuropathy  Hyperlipidemia, unspecified hyperlipidemia type - Plan: Lipid panel  Skin lesion Patient was unable to show me this through the video.  I discussed referral to dermatology at this time though she deferred until later date.  She will let us know when she is ready for the referral.  Hypertension She will check her blood pressure daily for the next week and send [place] a list of her readings.  We will have her follow-up in 3 to 4 months.  We will check lab work.  She will continue losartan.  Neuropathy (HCC) Chronic  and stable.  She will continue to monitor.  I discussed that if this worsens in any manner she needs to let us know.  Hyperlipidemia Continue Lipitor.  She will come in for labs in 6 weeks.  Follow-up in the office in 3 to 4 months.  CMA to contact patient to schedule this.  Social distancing precautions and sick precautions discussed regarding COVID-19.   I discussed the assessment and treatment plan with the patient. The patient was provided an opportunity to ask questions and all were answered. The patient agreed with the plan and demonstrated an understanding of the instructions.   The patient was advised to call back or seek an in-person evaluation if the symptoms worsen or if the condition fails to improve as anticipated.    Tommi Rumps, MD

## 2018-08-10 NOTE — Assessment & Plan Note (Signed)
Patient was unable to show me this through the video.  I discussed referral to dermatology at this time though she deferred until later date.  She will let us know when she is ready for the referral.

## 2018-08-10 NOTE — Assessment & Plan Note (Signed)
-  Continue Lipitor °

## 2018-08-10 NOTE — Telephone Encounter (Signed)
Please contact the patient and get her set up for lab work in 6 weeks.  Please also get her set up for follow-up in the office in 3 to 4 months.

## 2018-08-10 NOTE — Assessment & Plan Note (Signed)
Chronic and stable.  She will continue to monitor.  I discussed that if this worsens in any manner she needs to let us know.

## 2018-08-13 NOTE — Telephone Encounter (Signed)
Called and spoke with pt. Pt has been scheduled for lab and 4 month follow up appt.

## 2018-08-20 ENCOUNTER — Ambulatory Visit (INDEPENDENT_AMBULATORY_CARE_PROVIDER_SITE_OTHER): Payer: Medicare Other | Admitting: Nurse Practitioner

## 2018-08-20 ENCOUNTER — Encounter: Payer: Self-pay | Admitting: Family Medicine

## 2018-09-24 ENCOUNTER — Other Ambulatory Visit (INDEPENDENT_AMBULATORY_CARE_PROVIDER_SITE_OTHER): Payer: Medicare Other

## 2018-09-24 ENCOUNTER — Other Ambulatory Visit: Payer: Self-pay

## 2018-09-24 DIAGNOSIS — R7303 Prediabetes: Secondary | ICD-10-CM

## 2018-09-24 DIAGNOSIS — I10 Essential (primary) hypertension: Secondary | ICD-10-CM

## 2018-09-24 DIAGNOSIS — E785 Hyperlipidemia, unspecified: Secondary | ICD-10-CM

## 2018-09-24 LAB — COMPREHENSIVE METABOLIC PANEL
ALT: 11 U/L (ref 0–35)
AST: 14 U/L (ref 0–37)
Albumin: 4 g/dL (ref 3.5–5.2)
Alkaline Phosphatase: 62 U/L (ref 39–117)
BUN: 17 mg/dL (ref 6–23)
CO2: 27 mEq/L (ref 19–32)
Calcium: 9.6 mg/dL (ref 8.4–10.5)
Chloride: 103 mEq/L (ref 96–112)
Creatinine, Ser: 0.91 mg/dL (ref 0.40–1.20)
GFR: 60.16 mL/min (ref >60.00)
Glucose, Bld: 92 mg/dL (ref 70–99)
Potassium: 4.4 mEq/L (ref 3.5–5.1)
Sodium: 139 mEq/L (ref 135–145)
Total Bilirubin: 0.4 mg/dL (ref 0.2–1.2)
Total Protein: 6.8 g/dL (ref 6.0–8.3)

## 2018-09-24 LAB — LIPID PANEL
Cholesterol: 179 mg/dL (ref 0–200)
HDL: 95.9 mg/dL (ref >39.00)
LDL Cholesterol: 72 mg/dL (ref 0–99)
NonHDL: 82.92
Total CHOL/HDL Ratio: 2
Triglycerides: 57 mg/dL (ref 0.0–149.0)
VLDL: 11.4 mg/dL (ref 0.0–40.0)

## 2018-09-24 LAB — HEMOGLOBIN A1C: Hgb A1c MFr Bld: 6.4 % (ref 4.6–6.5)

## 2018-10-15 DIAGNOSIS — Z20828 Contact with and (suspected) exposure to other viral communicable diseases: Secondary | ICD-10-CM | POA: Diagnosis not present

## 2018-10-15 LAB — NOVEL CORONAVIRUS, NAA: SARS-CoV-2, NAA: NEGATIVE

## 2018-10-18 ENCOUNTER — Other Ambulatory Visit: Payer: Self-pay | Admitting: Internal Medicine

## 2018-10-31 ENCOUNTER — Other Ambulatory Visit: Payer: Self-pay

## 2018-10-31 ENCOUNTER — Ambulatory Visit (INDEPENDENT_AMBULATORY_CARE_PROVIDER_SITE_OTHER): Payer: Medicare Other | Admitting: Nurse Practitioner

## 2018-10-31 VITALS — BP 168/90 | HR 71 | Resp 16 | Ht 66.5 in | Wt 191.6 lb

## 2018-10-31 DIAGNOSIS — I83893 Varicose veins of bilateral lower extremities with other complications: Secondary | ICD-10-CM

## 2018-10-31 NOTE — Progress Notes (Signed)
Varicose veins of left lower extremity with inflammation (454.1  I83.10) Current Plans   Indication: Patient presents with symptomatic varicose veins of the left lower extremity.   Procedure: Sclerotherapy using hypertonic saline mixed with 1% Lidocaine was performed on the left lower extremity. Compression wraps were placed. The patient tolerated the procedure well. 

## 2018-11-13 ENCOUNTER — Ambulatory Visit (INDEPENDENT_AMBULATORY_CARE_PROVIDER_SITE_OTHER): Payer: Medicare Other

## 2018-11-13 DIAGNOSIS — Z0000 Encounter for general adult medical examination without abnormal findings: Secondary | ICD-10-CM

## 2018-11-13 NOTE — Progress Notes (Signed)
Subjective:   Sabrina Wilson is a 78 y.o. female who presents for Medicare Annual (Subsequent) preventive examination.  Review of Systems:  No ROS.  Medicare Wellness Virtual Visit.  Visual/audio telehealth visit, UTA vital signs.   See social history for additional risk factors.   Cardiac Risk Factors include: advanced age (>88men, >38 women);hypertension     Objective:     Vitals: There were no vitals taken for this visit.  There is no height or weight on file to calculate BMI.  Advanced Directives 11/13/2018 11/08/2017 09/13/2017 06/19/2017 06/17/2016 06/17/2015 01/13/2015  Does Patient Have a Medical Advance Directive? Yes No No No No No No  Type of Paramedic of [place];Living will - - - - - -  Does patient want to make changes to medical advance directive? No - Patient declined - - - - - -  Copy of [place] in Chart? No - copy requested - - - - - -  Would patient like information on creating a medical advance directive? - - No - Patient declined No - Patient declined No - Patient declined Yes - Educational materials given No - patient declined information    Tobacco Social History   Tobacco Use  Smoking Status Former Smoker  . Quit date: 10/12/2007  . Years since quitting: 11.0  Smokeless Tobacco Never Used     Counseling given: Not Answered   Clinical Intake:  Pre-visit preparation completed: Yes        Diabetes: No(Prediabetes. Followed by pcp.)  How often do you need to have someone help you when you read instructions, pamphlets, or other written materials from your doctor or pharmacy?: 1 - Never  Interpreter Needed?: No     Past Medical History:  Diagnosis Date  . Arthritis   . Colon cancer [facility]) 2009   T3,N0; s/p resection  Dr. Hulda Humphrey and chemotherapy by Dr. Cynda Acres  . Hernia of flank   . Hypertension   . Neuropathy   . Polyp at cervical os    s/p resection Dr. Sabra Heck   Past Surgical History:   Procedure Laterality Date  . BREAST BIOPSY Right 05/2014   Dr. Dwyane Luo office-benign  . BREAST SURGERY Right February 2016   Vacuum assisted biopsy for recurrent cyst, fibrocystic changes without evidence of malignancy.  Marland Kitchen CATARACT EXTRACTION W/PHACO Left 01/13/2015   Procedure: CATARACT EXTRACTION PHACO AND INTRAOCULAR LENS PLACEMENT (IOC);  Surgeon: Birder Robson, MD;  Location: [facility] ORS;  Service: Ophthalmology;  Laterality: Left;  [place]  1:02.9 AP   19.2 CDE  12.06 casette lot #  [id number]  . CATARACT EXTRACTION W/PHACO Right 02/03/2015   Procedure: CATARACT EXTRACTION PHACO AND INTRAOCULAR LENS PLACEMENT ([place]);  Surgeon: Birder Robson, MD;  Location: [facility] ORS;  Service: Ophthalmology;  Laterality: Right;  us00:53 ap46.5 cde10.79  . COLECTOMY    . COLONOSCOPY  2015   Dr Candace Cruise  . COLONOSCOPY WITH PROPOFOL N/A 09/13/2017   Procedure: COLONOSCOPY WITH PROPOFOL;  Surgeon: Robert Bellow, MD;  Location: [facility] ENDOSCOPY;  Service: Endoscopy;  Laterality: N/A;  . COLONOSCOPY WITH PROPOFOL N/A 11/08/2017   Procedure: COLONOSCOPY WITH PROPOFOL;  Surgeon: Robert Bellow, MD;  Location: [facility] ENDOSCOPY;  Service: Endoscopy;  Laterality: N/A;  . colostomy reversal    . DILATION AND CURETTAGE OF UTERUS  2014   Dr. Sabra Heck, benign per pt  . EYE SURGERY    . HERNIA REPAIR  07/13/12   ventral   . TONSILLECTOMY  Family History  Problem Relation Age of Onset  . Stroke Mother   . Diabetes Mother   . Cancer Father        colon  . Cancer Brother        colon  . Cancer Brother        brain   Social History   Socioeconomic History  . Marital status: Married    Spouse name: Not on file  . Number of children: Not on file  . Years of education: Not on file  . Highest education level: Not on file  Occupational History  . Not on file  Social Needs  . Financial resource strain: Not hard at all  . Food insecurity    Worry: Never true    Inability: Never true  . Transportation needs     Medical: No    Non-medical: No  Tobacco Use  . Smoking status: Former Smoker    Quit date: 10/12/2007    Years since quitting: 11.0  . Smokeless tobacco: Never Used  Substance and Sexual Activity  . Alcohol use: Yes    Alcohol/week: 1.0 standard drinks    Types: 1 Standard drinks or equivalent per week    Comment: with dinner  . Drug use: No  . Sexual activity: Not on file  Lifestyle  . Physical activity    Days per week: Not on file    Minutes per session: Not on file  . Stress: Not at all  Relationships  . Social Herbalist on phone: Not on file    Gets together: Not on file    Attends religious service: Not on file    Active member of club or organization: Not on file    Attends meetings of clubs or organizations: Not on file    Relationship status: Not on file  Other Topics Concern  . Not on file  Social History Narrative   Lives in [place] with husband. 2 children, son lives nearby.      Diet - regular      Exercise - none    Outpatient Encounter Medications as of 11/13/2018  Medication Sig  . atorvastatin (LIPITOR) 10 MG tablet TAKE ONE TABLET BY MOUTH DAILY  . losartan (COZAAR) 100 MG tablet TAKE ONE TABLET BY MOUTH DAILY  . Omega-3 Fatty Acids (FISH OIL PO) Take by mouth.  Marland Kitchen VITAMIN D, CHOLECALCIFEROL, PO Take 2,000 Units by mouth daily.  . [DISCONTINUED] vitamin A 10000 UNIT capsule Take by mouth.  . [DISCONTINUED] azelastine (ASTELIN) 0.1 % nasal spray Place 2 sprays into both nostrils 2 (two) times daily. Use in each nostril as directed (Patient not taking: Reported on 11/13/2018)  . [DISCONTINUED] fluticasone (FLONASE) 50 MCG/ACT nasal spray Place 2 sprays into both nostrils daily.   No facility-administered encounter medications on file as of 11/13/2018.     Activities of Daily Living In your present state of health, do you have any difficulty performing the following activities: 11/13/2018  Hearing? N  Vision? N  Difficulty concentrating  or making decisions? N  Walking or climbing stairs? N  Dressing or bathing? N  Doing errands, shopping? N  Preparing Food and eating ? N  Using the Toilet? N  In the past six months, have you accidently leaked urine? N  Do you have problems with loss of bowel control? N  Managing your Medications? N  Managing your Finances? N  Housekeeping or managing your Housekeeping? N  Some recent data might  be hidden    Patient Care Team: Leone Haven, MD as PCP - General (Family Medicine) Bary Castilla, Forest Gleason, MD as Consulting Physician (General Surgery) Dallas Schimke, MD (Internal Medicine)    Assessment:   This is a routine wellness examination for [place].  I connected with patient 11/13/18 at 11:00 AM EDT by an audio enabled telemedicine application and verified that I am speaking with the correct person using two identifiers. Patient stated full name and DOB. Patient gave permission to continue with virtual visit. Patient's location was at home and Nurse's location was at [place] office.   Health Screenings  Mammogram - 06/2018 Colonoscopy - 10/2017 Bone Density - 04/2009 Glaucoma -none Hearing -demonstrates normal hearing during visit. Hemoglobin A1C - 09/2018 (6.4) Cholesterol - 09/2018 Dental- visit every 6 months Vision- visits within the last 12 months.  Social  Alcohol intake - yes, 1 with meals      Smoking history- former    Smokers in home? none Illicit drug use? none Exercise - no routine. Increase of physical of activity encouraged.  Diet - regular Sexually Active -not currently BMI- discussed the importance of a healthy diet, water intake and the benefits of aerobic exercise.  Educational material provided.   Safety  Patient feels safe at home- yes Patient does have smoke detectors at home- yes Patient does wear sunscreen or protective clothing when in direct sunlight -yes Patient does wear seat belt when in a moving vehicle -yes Patient drives- yes   [id number] precautions and sickness symptoms discussed.   Activities of Daily Living Patient denies needing assistance with: driving, household chores, feeding themselves, getting from bed to chair, getting to the toilet, bathing/showering, dressing, managing money, or preparing meals.  No new identified risk were noted.    Depression Screen Patient denies losing interest in daily life, feeling hopeless, or crying easily over simple problems.   Medication-taking as directed and without issues.   Fall Screen Patient denies being afraid of falling or falling in the last year.   Memory Screen Patient is alert.  Patient denies difficulty focusing, concentrating or misplacing items. Correctly identified the president of the [place], season and recall. Patient likes to read, play piano and learn new music for brain stimulation.  Immunizations The following Immunizations were discussed: Influenza, shingles, pneumonia, and tetanus.   Other Providers Patient Care Team: Leone Haven, MD as PCP - General (Family Medicine) Bary Castilla Forest Gleason, MD as Consulting Physician (General Surgery) Dallas Schimke, MD (Internal Medicine)  Exercise Activities and Dietary recommendations Current Exercise Habits: The patient does not participate in regular exercise at present  Goals      Patient Stated   . Increase physical activity (pt-stated)     Walk for exercise on the church grounds       Fall Risk Fall Risk  11/13/2018 08/10/2018 05/09/2018 07/24/2017 07/12/2016  Falls in the past year? 0 1 0 No No  Number falls in past yr: - 1 0 - -  Injury with Fall? - 1 0 - -  Risk for fall due to : - History of fall(s) - - -  Is the patient's home free of loose throw rugs in walkways, pet beds, electrical cords, etc? yes      Grab bars in the bathroom? yes      Handrails on the stairs? yes      Adequate lighting? yes   Depression Screen PHQ 2/9 Scores 11/13/2018 05/09/2018 07/24/2017 07/12/2016  PHQ - 2  Score 0 0 0 0     Cognitive Function     6CIT Screen 11/13/2018  What Year? 0 points  What month? 0 points  What time? 0 points  Count back from 20 0 points  Months in reverse 0 points  Repeat phrase 0 points  Total Score 0    Immunization History  Administered Date(s) Administered  . Influenza Split 01/19/2011, 03/26/2012  . Influenza, High Dose Seasonal PF 02/07/2017, 01/31/2018  . Influenza,inj,Quad PF,6+ Mos 02/07/2013, 01/22/2015, 02/01/2016  . Influenza-Unspecified 02/06/2014  . Pneumococcal Conjugate-13 09/27/2013  . Pneumococcal Polysaccharide-23 04/12/2009  . Zoster 02/03/2011  . Zoster Recombinat (Shingrix) 06/11/2018   Screening Tests Health Maintenance  Topic Date Due  . INFLUENZA VACCINE  11/24/2018  . TETANUS/TDAP  03/26/2019  . COLONOSCOPY  11/09/2027  . DEXA SCAN  Completed  . PNA vac Low Risk Adult  Completed      Plan:    End of life planning; Advance aging; Advanced directives discussed.  Copy of current HCPOA/Living Will requested.    I have personally reviewed and noted the following in the patient's chart:   . Medical and social history . Use of alcohol, tobacco or illicit drugs  . Current medications and supplements . Functional ability and status . Nutritional status . Physical activity . Advanced directives . List of other physicians . Hospitalizations, surgeries, and ER visits in previous 12 months . Vitals . Screenings to include cognitive, depression, and falls . Referrals and appointments  In addition, I have reviewed and discussed with patient certain preventive protocols, quality metrics, and best practice recommendations. A written personalized care plan for preventive services as well as general preventive health recommendations were provided to patient.     Varney Biles, LPN  [id number]/[id number]

## 2018-11-13 NOTE — Patient Instructions (Addendum)
  Sabrina Wilson , Thank you for taking time to come for your Medicare Wellness Visit. I appreciate your ongoing commitment to your health goals. Please review the following plan we discussed and let me know if I can assist you in the future.   These are the goals we discussed: Goals      Patient Stated   . Increase physical activity (pt-stated)     Walk for exercise on the church grounds       This is a list of the screening recommended for you and due dates:  Health Maintenance  Topic Date Due  . Flu Shot  11/24/2018  . Tetanus Vaccine  03/26/2019  . Colon Cancer Screening  11/09/2027  . DEXA scan (bone density measurement)  Completed  . Pneumonia vaccines  Completed

## 2018-11-21 ENCOUNTER — Ambulatory Visit (INDEPENDENT_AMBULATORY_CARE_PROVIDER_SITE_OTHER): Payer: Medicare Other | Admitting: Nurse Practitioner

## 2018-11-21 ENCOUNTER — Other Ambulatory Visit: Payer: Self-pay

## 2018-11-21 ENCOUNTER — Encounter (INDEPENDENT_AMBULATORY_CARE_PROVIDER_SITE_OTHER): Payer: Self-pay | Admitting: Nurse Practitioner

## 2018-11-21 VITALS — BP 142/81 | HR 74 | Resp 12 | Ht 67.0 in | Wt 189.0 lb

## 2018-11-21 DIAGNOSIS — I83893 Varicose veins of bilateral lower extremities with other complications: Secondary | ICD-10-CM | POA: Diagnosis not present

## 2018-11-21 NOTE — Progress Notes (Signed)
Varicose veins of right  lower extremity with inflammation (454.1  I83.10) Current Plans   Indication: Patient presents with symptomatic varicose veins of the right  lower extremity.   Procedure: Sclerotherapy using hypertonic saline mixed with 1% Lidocaine was performed on the right lower extremity. Compression wraps were placed. The patient tolerated the procedure well. 

## 2018-12-06 ENCOUNTER — Ambulatory Visit (INDEPENDENT_AMBULATORY_CARE_PROVIDER_SITE_OTHER): Payer: Medicare Other | Admitting: Nurse Practitioner

## 2018-12-07 ENCOUNTER — Encounter (INDEPENDENT_AMBULATORY_CARE_PROVIDER_SITE_OTHER): Payer: Self-pay | Admitting: Nurse Practitioner

## 2018-12-07 ENCOUNTER — Other Ambulatory Visit: Payer: Self-pay

## 2018-12-07 ENCOUNTER — Ambulatory Visit (INDEPENDENT_AMBULATORY_CARE_PROVIDER_SITE_OTHER): Payer: Medicare Other | Admitting: Nurse Practitioner

## 2018-12-07 VITALS — BP 154/83 | HR 76 | Resp 16 | Wt 190.0 lb

## 2018-12-07 DIAGNOSIS — I83893 Varicose veins of bilateral lower extremities with other complications: Secondary | ICD-10-CM

## 2018-12-07 DIAGNOSIS — I83892 Varicose veins of left lower extremities with other complications: Secondary | ICD-10-CM

## 2018-12-10 ENCOUNTER — Encounter (INDEPENDENT_AMBULATORY_CARE_PROVIDER_SITE_OTHER): Payer: Self-pay | Admitting: Nurse Practitioner

## 2018-12-10 NOTE — Progress Notes (Signed)
Varicose veins of left lower extremity with inflammation (454.1  I83.10) Current Plans   Indication: Patient presents with symptomatic varicose veins of the left lower extremity.   Procedure: Sclerotherapy using hypertonic saline mixed with 1% Lidocaine was performed on the left lower extremity. Compression wraps were placed. The patient tolerated the procedure well. 

## 2018-12-14 ENCOUNTER — Ambulatory Visit (INDEPENDENT_AMBULATORY_CARE_PROVIDER_SITE_OTHER): Payer: Medicare Other | Admitting: Family Medicine

## 2018-12-14 ENCOUNTER — Other Ambulatory Visit: Payer: Self-pay

## 2018-12-14 ENCOUNTER — Encounter: Payer: Self-pay | Admitting: Family Medicine

## 2018-12-14 VITALS — Ht 66.5 in | Wt 187.0 lb

## 2018-12-14 DIAGNOSIS — I10 Essential (primary) hypertension: Secondary | ICD-10-CM | POA: Diagnosis not present

## 2018-12-14 DIAGNOSIS — M545 Low back pain: Secondary | ICD-10-CM

## 2018-12-14 DIAGNOSIS — C187 Malignant neoplasm of sigmoid colon: Secondary | ICD-10-CM

## 2018-12-14 DIAGNOSIS — E785 Hyperlipidemia, unspecified: Secondary | ICD-10-CM | POA: Diagnosis not present

## 2018-12-14 DIAGNOSIS — G8929 Other chronic pain: Secondary | ICD-10-CM

## 2018-12-14 DIAGNOSIS — L989 Disorder of the skin and subcutaneous tissue, unspecified: Secondary | ICD-10-CM

## 2018-12-14 MED ORDER — AMLODIPINE BESYLATE 5 MG PO TABS: 5.0000 mg | ORAL_TABLET | Freq: Every day | ORAL | 3 refills | Status: DC

## 2018-12-14 NOTE — Progress Notes (Signed)
Virtual Visit via video Note  This visit type was conducted due to national recommendations for restrictions regarding the COVID-19 pandemic (e.g. social distancing).  This format is felt to be most appropriate for this patient at this time.  All issues noted in this document were discussed and addressed.  No physical exam was performed (except for noted visual exam findings with Video Visits).   I connected with Sabrina Wilson today at  3:30 PM EDT by a video enabled telemedicine application and verified that I am speaking with the correct person using two identifiers. Location patient: home Location provider: work Persons participating in the virtual visit: patient, provider  I discussed the limitations, risks, security and privacy concerns of performing an evaluation and management service by telephone and the availability of in person appointments. I also discussed with the patient that there may be a patient responsible charge related to this service. The patient expressed understanding and agreed to proceed.   Reason for visit: follow-up  HPI: HYPERLIPIDEMIA Symptoms Chest pain on exertion:  no   Medications: Compliance- taking lipitor Right upper quadrant pain- no  Muscle aches- no  HYPERTENSION  Disease Monitoring  Home BP Monitoring 140s/70s-80s Chest pain- no    Dyspnea- no Medications  Compliance-  Taking losartan.  Edema- no  Sigmoid cancer: released by oncology. Colonoscopy up to date last year with tubular adenomas.  Patient notes she was advised 5-year recall.  Denies blood in her stool.  Skin lesions: She notes several skin lesions that have popped up on her face, neck, and arms.  They are white.  She is ready to see dermatology.  Low back pain: This is a chronic issue.  Bilateral back pain.  She does note occasional numbness in her right anterior leg that occurs only when she is been standing for long period of time.  No injury to her back.  No focal weakness.  No  incontinence.  No saddle anesthesia.  No prior imaging.     ROS: See pertinent positives and negatives per HPI.  Past Medical History:  Diagnosis Date  . Arthritis   . Colon cancer [facility]) 2009   T3,N0; s/p resection  Dr. Hulda Humphrey and chemotherapy by Dr. Cynda Acres  . Hernia of flank   . Hypertension   . Neuropathy   . Polyp at cervical os    s/p resection Dr. Sabra Heck    Past Surgical History:  Procedure Laterality Date  . BREAST BIOPSY Right 05/2014   Dr. Dwyane Luo office-benign  . BREAST SURGERY Right February 2016   Vacuum assisted biopsy for recurrent cyst, fibrocystic changes without evidence of malignancy.  Marland Kitchen CATARACT EXTRACTION W/PHACO Left 01/13/2015   Procedure: CATARACT EXTRACTION PHACO AND INTRAOCULAR LENS PLACEMENT (IOC);  Surgeon: Birder Robson, MD;  Location: [facility] ORS;  Service: Ophthalmology;  Laterality: Left;  [place]  1:02.9 AP   19.2 CDE  12.06 casette lot #  [id number]  . CATARACT EXTRACTION W/PHACO Right 02/03/2015   Procedure: CATARACT EXTRACTION PHACO AND INTRAOCULAR LENS PLACEMENT ([place]);  Surgeon: Birder Robson, MD;  Location: [facility] ORS;  Service: Ophthalmology;  Laterality: Right;  us00:53 ap46.5 cde10.79  . COLECTOMY    . COLONOSCOPY  2015   Dr Candace Cruise  . COLONOSCOPY WITH PROPOFOL N/A 09/13/2017   Procedure: COLONOSCOPY WITH PROPOFOL;  Surgeon: Robert Bellow, MD;  [facility] ENDOSCOPY;  Service: Endoscopy;  Laterality: N/A;  . COLONOSCOPY WITH PROPOFOL N/A 11/08/2017   Procedure: COLONOSCOPY WITH PROPOFOL;  Surgeon: Robert Bellow, MD;  Location:  [place] ENDOSCOPY;  Service: Endoscopy;  Laterality: N/A;  . colostomy reversal    . DILATION AND CURETTAGE OF UTERUS  2014   Dr. Sabra Heck, benign per pt  . EYE SURGERY    . HERNIA REPAIR  07/13/12   ventral   . TONSILLECTOMY      Family History  Problem Relation Age of Onset  . Stroke Mother   . Diabetes Mother   . Cancer Father        colon  . Cancer Brother        colon  . Cancer Brother        brain     SOCIAL HX: Former smoker   Current Outpatient Medications:  .  atorvastatin (LIPITOR) 10 MG tablet, TAKE ONE TABLET BY MOUTH DAILY, Disp: 90 tablet, Rfl: 2 .  losartan (COZAAR) 100 MG tablet, TAKE ONE TABLET BY MOUTH DAILY, Disp: 90 tablet, Rfl: 0 .  Omega-3 Fatty Acids (FISH OIL PO), Take by mouth., Disp: , Rfl:  .  VITAMIN D, CHOLECALCIFEROL, PO, Take 2,000 Units by mouth daily., Disp: , Rfl:  .  amLODipine (NORVASC) 5 MG tablet, Take 1 tablet (5 mg total) by mouth daily., Disp: 90 tablet, Rfl: 3  EXAM:  VITALS per patient if applicable: None.  GENERAL: alert, oriented, appears well and in no acute distress  HEENT: atraumatic, conjunttiva clear, no obvious abnormalities on inspection of external nose and ears  NECK: normal movements of the head and neck  LUNGS: on inspection no signs of respiratory distress, breathing rate appears normal, no obvious gross SOB, gasping or wheezing  CV: no obvious cyanosis  MS: moves all visible extremities without noticeable abnormality  PSYCH/NEURO: pleasant and cooperative, no obvious depression or anxiety, speech and thought processing grossly intact  ASSESSMENT AND PLAN:  Discussed the following assessment and plan:  Hypertension Above goal.  Will start amlodipine.  She will continue losartan.  Follow-up in 4 to 6 weeks.  She will monitor her blood pressure daily.  Cancer of sigmoid [facility]) Colonoscopy up-to-date.  Skin lesion Refer to dermatology.  Hyperlipidemia Continue Lipitor.  Chronic bilateral low back pain without sciatica She will come in for an x-ray.  Trial of physical therapy.    Social distancing precautions and sick precautions given regarding COVID-19.  I discussed the assessment and treatment plan with the patient. The patient was provided an opportunity to ask questions and all were answered. The patient agreed with the plan and demonstrated an understanding of the instructions.   The patient was advised to  call back or seek an in-person evaluation if the symptoms worsen or if the condition fails to improve as anticipated.   Tommi Rumps, MD

## 2018-12-14 NOTE — Assessment & Plan Note (Signed)
She will come in for an x-ray.  Trial of physical therapy.

## 2018-12-14 NOTE — Assessment & Plan Note (Signed)
Above goal.  Will start amlodipine.  She will continue losartan.  Follow-up in 4 to 6 weeks.  She will monitor her blood pressure daily.

## 2018-12-14 NOTE — Assessment & Plan Note (Signed)
Refer to dermatology 

## 2018-12-14 NOTE — Assessment & Plan Note (Signed)
-  Continue Lipitor °

## 2018-12-14 NOTE — Assessment & Plan Note (Signed)
Colonoscopy up to date

## 2018-12-17 ENCOUNTER — Telehealth: Payer: Self-pay

## 2018-12-17 NOTE — Telephone Encounter (Signed)
Pt scheduled for flu shot  Copied from [place] [id number]. Topic: General - Other >> Dec 17, 2018 10:38 AM Leward Quan A wrote: Reason for CRM: Patient would like a call back to schedule her flu shot can be reached at Ph# [phone]

## 2018-12-19 ENCOUNTER — Other Ambulatory Visit: Payer: Self-pay

## 2018-12-19 ENCOUNTER — Ambulatory Visit (INDEPENDENT_AMBULATORY_CARE_PROVIDER_SITE_OTHER): Payer: Medicare Other

## 2018-12-19 ENCOUNTER — Telehealth: Payer: Self-pay

## 2018-12-19 DIAGNOSIS — Z23 Encounter for immunization: Secondary | ICD-10-CM | POA: Diagnosis not present

## 2018-12-19 NOTE — Telephone Encounter (Signed)
Patient presented today for flu shot and wanted to know if PCP would recommend that she get the pneumonia vaccine this year.  Patient's immunizations documented in chart:  Pneumococcal conjugate-13:  09/27/2013  Pneumococcal polysaccharide-23:  04/12/09  Please advise.

## 2018-12-21 ENCOUNTER — Ambulatory Visit (INDEPENDENT_AMBULATORY_CARE_PROVIDER_SITE_OTHER): Payer: Medicare Other | Admitting: Nurse Practitioner

## 2018-12-21 DIAGNOSIS — Z23 Encounter for immunization: Secondary | ICD-10-CM

## 2018-12-23 NOTE — Telephone Encounter (Signed)
She does not need another pneumonia vaccine. She has gotten all the recommended dose of the pneumonia vaccine.

## 2018-12-24 NOTE — Telephone Encounter (Signed)
Patient is aware 

## 2018-12-24 NOTE — Telephone Encounter (Signed)
Called pt. No answer. LMTCB

## 2019-01-01 ENCOUNTER — Encounter (INDEPENDENT_AMBULATORY_CARE_PROVIDER_SITE_OTHER): Payer: Self-pay | Admitting: Nurse Practitioner

## 2019-01-01 ENCOUNTER — Ambulatory Visit (INDEPENDENT_AMBULATORY_CARE_PROVIDER_SITE_OTHER): Payer: Medicare Other | Admitting: Nurse Practitioner

## 2019-01-01 ENCOUNTER — Other Ambulatory Visit: Payer: Self-pay

## 2019-01-01 VITALS — BP 145/84 | HR 71 | Resp 16 | Wt 187.8 lb

## 2019-01-01 DIAGNOSIS — I83811 Varicose veins of right lower extremities with pain: Secondary | ICD-10-CM | POA: Diagnosis not present

## 2019-01-01 DIAGNOSIS — I83893 Varicose veins of bilateral lower extremities with other complications: Secondary | ICD-10-CM

## 2019-01-01 NOTE — Progress Notes (Signed)
Varicose veins of right  lower extremity with inflammation (454.1  I83.10) Current Plans   Indication: Patient presents with symptomatic varicose veins of the right  lower extremity.   Procedure: Sclerotherapy using hypertonic saline mixed with 1% Lidocaine was performed on the right lower extremity. Compression wraps were placed. The patient tolerated the procedure well. 

## 2019-01-14 ENCOUNTER — Other Ambulatory Visit: Payer: Self-pay | Admitting: Family Medicine

## 2019-01-17 DIAGNOSIS — L821 Other seborrheic keratosis: Secondary | ICD-10-CM | POA: Diagnosis not present

## 2019-01-17 DIAGNOSIS — D1800 Hemangioma unspecified site: Secondary | ICD-10-CM | POA: Diagnosis not present

## 2019-01-17 DIAGNOSIS — L820 Inflamed seborrheic keratosis: Secondary | ICD-10-CM | POA: Diagnosis not present

## 2019-01-17 DIAGNOSIS — D2372 Other benign neoplasm of skin of left lower limb, including hip: Secondary | ICD-10-CM | POA: Diagnosis not present

## 2019-01-22 ENCOUNTER — Encounter (INDEPENDENT_AMBULATORY_CARE_PROVIDER_SITE_OTHER): Payer: Self-pay | Admitting: Nurse Practitioner

## 2019-01-22 ENCOUNTER — Ambulatory Visit (INDEPENDENT_AMBULATORY_CARE_PROVIDER_SITE_OTHER): Payer: Medicare Other | Admitting: Nurse Practitioner

## 2019-01-22 ENCOUNTER — Other Ambulatory Visit: Payer: Self-pay

## 2019-01-22 VITALS — BP 135/78 | HR 71 | Resp 16 | Wt 189.0 lb

## 2019-01-22 DIAGNOSIS — I83893 Varicose veins of bilateral lower extremities with other complications: Secondary | ICD-10-CM | POA: Diagnosis not present

## 2019-01-22 NOTE — Progress Notes (Signed)
Varicose veins of bilateral posterior lower extremity with inflammation (454.1  I83.10) Current Plans   Indication: Patient presents with symptomatic varicose veins of the bilateral posterior lower extremity.   Procedure: Sclerotherapy using hypertonic saline mixed with 1% Lidocaine was performed on the bilateral posterior lower extremity. Compression wraps were placed. The patient tolerated the procedure well.

## 2019-02-05 ENCOUNTER — Ambulatory Visit (INDEPENDENT_AMBULATORY_CARE_PROVIDER_SITE_OTHER): Payer: Medicare Other | Admitting: Nurse Practitioner

## 2019-02-05 ENCOUNTER — Encounter (INDEPENDENT_AMBULATORY_CARE_PROVIDER_SITE_OTHER): Payer: Self-pay | Admitting: Nurse Practitioner

## 2019-02-05 ENCOUNTER — Other Ambulatory Visit: Payer: Self-pay

## 2019-02-05 VITALS — BP 152/81 | HR 79 | Resp 16 | Wt 190.0 lb

## 2019-02-05 DIAGNOSIS — I83892 Varicose veins of left lower extremities with other complications: Secondary | ICD-10-CM | POA: Diagnosis not present

## 2019-02-05 DIAGNOSIS — I83893 Varicose veins of bilateral lower extremities with other complications: Secondary | ICD-10-CM

## 2019-02-05 DIAGNOSIS — I83891 Varicose veins of right lower extremities with other complications: Secondary | ICD-10-CM

## 2019-02-05 NOTE — Progress Notes (Signed)
Varicose veins of bilateral  lower extremity with inflammation (454.1  I83.10) Current Plans   Indication: Patient presents with symptomatic varicose veins of the bilateral  lower extremity.   Procedure: Sclerotherapy using hypertonic saline mixed with 1% Lidocaine was performed on the bilateral lower extremity. Compression wraps were placed. The patient tolerated the procedure well. 

## 2019-02-26 ENCOUNTER — Encounter (INDEPENDENT_AMBULATORY_CARE_PROVIDER_SITE_OTHER): Payer: Self-pay | Admitting: Nurse Practitioner

## 2019-02-26 ENCOUNTER — Ambulatory Visit (INDEPENDENT_AMBULATORY_CARE_PROVIDER_SITE_OTHER): Payer: Medicare Other | Admitting: Nurse Practitioner

## 2019-02-26 ENCOUNTER — Other Ambulatory Visit: Payer: Self-pay

## 2019-02-26 VITALS — BP 132/83 | HR 76 | Resp 16 | Wt 190.0 lb

## 2019-02-26 DIAGNOSIS — I83892 Varicose veins of left lower extremities with other complications: Secondary | ICD-10-CM

## 2019-02-26 DIAGNOSIS — I83893 Varicose veins of bilateral lower extremities with other complications: Secondary | ICD-10-CM

## 2019-02-26 DIAGNOSIS — I83891 Varicose veins of right lower extremities with other complications: Secondary | ICD-10-CM

## 2019-02-26 NOTE — Progress Notes (Signed)
Varicose veins of bilateral  lower extremity with inflammation (454.1  I83.10) Current Plans   Indication: Patient presents with symptomatic varicose veins of the bilateral  lower extremity.   Procedure: Sclerotherapy using hypertonic saline mixed with 1% Lidocaine was performed on the bilateral lower extremity. Compression wraps were placed. The patient tolerated the procedure well. 

## 2019-03-14 DIAGNOSIS — L821 Other seborrheic keratosis: Secondary | ICD-10-CM | POA: Diagnosis not present

## 2019-03-14 DIAGNOSIS — L820 Inflamed seborrheic keratosis: Secondary | ICD-10-CM | POA: Diagnosis not present

## 2019-03-19 ENCOUNTER — Ambulatory Visit (INDEPENDENT_AMBULATORY_CARE_PROVIDER_SITE_OTHER): Payer: Medicare Other | Admitting: Nurse Practitioner

## 2019-03-19 ENCOUNTER — Encounter (INDEPENDENT_AMBULATORY_CARE_PROVIDER_SITE_OTHER): Payer: Self-pay | Admitting: Nurse Practitioner

## 2019-03-19 ENCOUNTER — Other Ambulatory Visit: Payer: Self-pay

## 2019-03-19 VITALS — BP 144/87 | HR 83 | Resp 16 | Ht 66.0 in | Wt 187.0 lb

## 2019-03-19 DIAGNOSIS — I83893 Varicose veins of bilateral lower extremities with other complications: Secondary | ICD-10-CM | POA: Diagnosis not present

## 2019-03-25 NOTE — Progress Notes (Addendum)
Varicose veins of bilateral  lower extremity with inflammation (454.1  I83.10) Current Plans   Indication: Patient presents with symptomatic varicose veins of the bilateral  lower extremity.   Procedure: Sclerotherapy using hypertonic saline mixed with 1% Lidocaine was performed on the bilateral lower extremity. Compression wraps were placed. The patient tolerated the procedure well. 

## 2019-04-13 ENCOUNTER — Other Ambulatory Visit: Payer: Self-pay | Admitting: Family Medicine

## 2019-04-13 DIAGNOSIS — E785 Hyperlipidemia, unspecified: Secondary | ICD-10-CM

## 2019-04-30 ENCOUNTER — Ambulatory Visit (INDEPENDENT_AMBULATORY_CARE_PROVIDER_SITE_OTHER): Payer: Medicare Other | Admitting: Nurse Practitioner

## 2019-04-30 ENCOUNTER — Other Ambulatory Visit: Payer: Self-pay

## 2019-04-30 ENCOUNTER — Encounter (INDEPENDENT_AMBULATORY_CARE_PROVIDER_SITE_OTHER): Payer: Self-pay | Admitting: Nurse Practitioner

## 2019-04-30 VITALS — BP 135/80 | HR 81 | Resp 16 | Ht 66.0 in | Wt 186.0 lb

## 2019-04-30 DIAGNOSIS — I83891 Varicose veins of right lower extremities with other complications: Secondary | ICD-10-CM

## 2019-04-30 DIAGNOSIS — I83893 Varicose veins of bilateral lower extremities with other complications: Secondary | ICD-10-CM

## 2019-04-30 NOTE — Progress Notes (Signed)
Varicose veins of bilateral  lower extremity with inflammation (454.1  I83.10) Current Plans   Indication: Patient presents with symptomatic varicose veins of the bilateral  lower extremity.   Procedure: Sclerotherapy using hypertonic saline mixed with 1% Lidocaine was performed on the bilateral lower extremities. Compression wraps were placed. The patient tolerated the procedure well.

## 2019-05-28 ENCOUNTER — Ambulatory Visit (INDEPENDENT_AMBULATORY_CARE_PROVIDER_SITE_OTHER): Payer: Medicare Other | Admitting: Nurse Practitioner

## 2019-05-28 ENCOUNTER — Other Ambulatory Visit: Payer: Self-pay

## 2019-05-28 VITALS — BP 134/83 | HR 76 | Resp 16 | Wt 190.0 lb

## 2019-05-28 DIAGNOSIS — I83893 Varicose veins of bilateral lower extremities with other complications: Secondary | ICD-10-CM

## 2019-05-29 ENCOUNTER — Encounter (INDEPENDENT_AMBULATORY_CARE_PROVIDER_SITE_OTHER): Payer: Self-pay | Admitting: Nurse Practitioner

## 2019-07-02 ENCOUNTER — Encounter (INDEPENDENT_AMBULATORY_CARE_PROVIDER_SITE_OTHER): Payer: Self-pay | Admitting: Nurse Practitioner

## 2019-07-02 ENCOUNTER — Other Ambulatory Visit: Payer: Self-pay

## 2019-07-02 ENCOUNTER — Ambulatory Visit (INDEPENDENT_AMBULATORY_CARE_PROVIDER_SITE_OTHER): Payer: Medicare Other | Admitting: Nurse Practitioner

## 2019-07-02 VITALS — BP 150/81 | HR 75 | Resp 18 | Ht 64.0 in | Wt 186.0 lb

## 2019-07-02 DIAGNOSIS — I83893 Varicose veins of bilateral lower extremities with other complications: Secondary | ICD-10-CM

## 2019-07-07 ENCOUNTER — Encounter (INDEPENDENT_AMBULATORY_CARE_PROVIDER_SITE_OTHER): Payer: Self-pay | Admitting: Nurse Practitioner

## 2019-07-08 NOTE — Progress Notes (Signed)
Varicose veins of bilateral  lower extremity with inflammation (454.1  I83.10) Current Plans   Indication: Patient presents with symptomatic varicose veins of the bilateral  lower extremity.   Procedure: Sclerotherapy using hypertonic saline mixed with 1% Lidocaine was performed on the bilateral  lower extremity. Compression wraps were placed. The patient tolerated the procedure well.  HPI: The patient continues to have pain in the lower extremities with dependency despite . The pain is lessened with elevation. Graduated compression stockings, Class I (20-30 mmHg), have been worn but the stockings do not eliminate the leg pain. Over-the-counter analgesics do not improve the symptoms. The degree of discomfort continues to interfere with daily activities. The patient notes the pain in the legs is causing problems with daily exercise, at the workplace and even with household activities and maintenance such as standing in the kitchen preparing meals and doing dishes.   Subjective: varicose veins bilaterally  Objective: The patient continues to have numerous remaining varicose veins bilaterally despite sclerotherapy  Plan: Recommend:  The patient has had successful ablation of the previously incompetent saphenous venous system but still has persistent symptoms of pain and swelling that are having a negative impact on daily life and daily activities.  Patient should undergo injection sclerotherapy to treat the residual varicosities.  The risks, benefits and alternative therapies were reviewed in detail with the patient.  All questions were answered.  The patient agrees to proceed with sclerotherapy at their convenience.  The patient will continue wearing the graduated compression stockings and using the over-the-counter pain medications to treat her symptoms.

## 2019-07-12 ENCOUNTER — Telehealth (INDEPENDENT_AMBULATORY_CARE_PROVIDER_SITE_OTHER): Payer: Self-pay | Admitting: Nurse Practitioner

## 2019-07-12 NOTE — Telephone Encounter (Signed)
The patient has varicosities remaining in her bilateral popliteal spaces, the bilateral inner thighs, as well as the posterior calves. There are some bothersome areas at her ankle as well.

## 2019-07-12 NOTE — Telephone Encounter (Signed)
This patient has had over 12 sclero sessions and [employer] wants to know the exact location of the remaining varicose veins in order to investigate additional visits. They need this response by 12pm today.

## 2019-07-23 ENCOUNTER — Telehealth: Payer: Self-pay | Admitting: Family Medicine

## 2019-07-23 DIAGNOSIS — E785 Hyperlipidemia, unspecified: Secondary | ICD-10-CM

## 2019-07-23 MED ORDER — ATORVASTATIN CALCIUM 10 MG PO TABS: 10.0000 mg | ORAL_TABLET | Freq: Every day | ORAL | 1 refills | Status: DC

## 2019-07-23 NOTE — Telephone Encounter (Signed)
Pt needs a refill on atorvastatin. Pt said she doesn't have any refills. She would like it called in to [facility].

## 2019-08-06 ENCOUNTER — Ambulatory Visit (INDEPENDENT_AMBULATORY_CARE_PROVIDER_SITE_OTHER): Payer: Medicare Other | Admitting: Nurse Practitioner

## 2019-08-06 ENCOUNTER — Encounter (INDEPENDENT_AMBULATORY_CARE_PROVIDER_SITE_OTHER): Payer: Self-pay | Admitting: Nurse Practitioner

## 2019-08-06 ENCOUNTER — Other Ambulatory Visit: Payer: Self-pay

## 2019-08-06 VITALS — BP 137/77 | HR 82 | Ht 66.0 in | Wt 189.0 lb

## 2019-08-06 DIAGNOSIS — I83893 Varicose veins of bilateral lower extremities with other complications: Secondary | ICD-10-CM

## 2019-08-06 NOTE — Progress Notes (Signed)
Varicose veins of bilateral  lower extremity with inflammation (454.1  I83.10) Current Plans   Indication: Patient presents with symptomatic varicose veins of the bilateral  lower extremity.   Procedure: Sclerotherapy using hypertonic saline mixed with 1% Lidocaine was performed on the bilateral lower extremity. Compression wraps were placed. The patient tolerated the procedure well. 

## 2019-08-21 ENCOUNTER — Other Ambulatory Visit: Payer: Self-pay | Admitting: Family Medicine

## 2019-08-21 DIAGNOSIS — Z1231 Encounter for screening mammogram for malignant neoplasm of breast: Secondary | ICD-10-CM

## 2019-08-22 ENCOUNTER — Ambulatory Visit
Admission: RE | Admit: 2019-08-22 | Discharge: 2019-08-22 | Disposition: A | Discharge status: Home / Self Care | Payer: Medicare Other | Source: Ambulatory Visit | Attending: Family Medicine | Admitting: Family Medicine

## 2019-08-22 DIAGNOSIS — Z1231 Encounter for screening mammogram for malignant neoplasm of breast: Secondary | ICD-10-CM | POA: Diagnosis not present

## 2019-09-03 ENCOUNTER — Encounter (INDEPENDENT_AMBULATORY_CARE_PROVIDER_SITE_OTHER): Payer: Self-pay | Admitting: Nurse Practitioner

## 2019-09-03 ENCOUNTER — Other Ambulatory Visit: Payer: Self-pay

## 2019-09-03 ENCOUNTER — Ambulatory Visit (INDEPENDENT_AMBULATORY_CARE_PROVIDER_SITE_OTHER): Payer: Medicare Other | Admitting: Nurse Practitioner

## 2019-09-03 VITALS — BP 150/84 | HR 74 | Wt 188.0 lb

## 2019-09-03 DIAGNOSIS — I83891 Varicose veins of right lower extremities with other complications: Secondary | ICD-10-CM | POA: Diagnosis not present

## 2019-09-03 DIAGNOSIS — I83892 Varicose veins of left lower extremities with other complications: Secondary | ICD-10-CM

## 2019-09-03 DIAGNOSIS — I83893 Varicose veins of bilateral lower extremities with other complications: Secondary | ICD-10-CM

## 2019-09-04 NOTE — Progress Notes (Signed)
Varicose veins of bilateral  lower extremity with inflammation (454.1  I83.10) Current Plans   Indication: Patient presents with symptomatic varicose veins of the bilateral  lower extremity.   Procedure: Sclerotherapy using hypertonic saline mixed with 1% Lidocaine was performed on the bilateral lower extremity. Compression wraps were placed. The patient tolerated the procedure well. 

## 2019-10-08 ENCOUNTER — Ambulatory Visit (INDEPENDENT_AMBULATORY_CARE_PROVIDER_SITE_OTHER): Payer: Medicare Other | Admitting: Nurse Practitioner

## 2019-10-08 ENCOUNTER — Other Ambulatory Visit: Payer: Self-pay

## 2019-10-08 ENCOUNTER — Encounter (INDEPENDENT_AMBULATORY_CARE_PROVIDER_SITE_OTHER): Payer: Self-pay | Admitting: Nurse Practitioner

## 2019-10-08 VITALS — BP 167/80 | HR 70 | Ht 67.0 in | Wt 185.0 lb

## 2019-10-08 DIAGNOSIS — I83893 Varicose veins of bilateral lower extremities with other complications: Secondary | ICD-10-CM

## 2019-10-08 DIAGNOSIS — I83891 Varicose veins of right lower extremities with other complications: Secondary | ICD-10-CM

## 2019-10-09 NOTE — Progress Notes (Signed)
Varicose veins of bilateral  lower extremity with inflammation (454.1  I83.10) Current Plans   Indication: Patient presents with symptomatic varicose veins of the bilateral  lower extremity.   Procedure: Sclerotherapy using hypertonic saline mixed with 1% Lidocaine was performed on the bilateral lower extremity. Compression wraps were placed. The patient tolerated the procedure well. 

## 2019-11-05 ENCOUNTER — Ambulatory Visit (INDEPENDENT_AMBULATORY_CARE_PROVIDER_SITE_OTHER): Payer: Medicare Other | Admitting: Nurse Practitioner

## 2019-11-07 ENCOUNTER — Other Ambulatory Visit: Payer: Self-pay

## 2019-11-07 ENCOUNTER — Ambulatory Visit (INDEPENDENT_AMBULATORY_CARE_PROVIDER_SITE_OTHER): Payer: Medicare Other | Admitting: Nurse Practitioner

## 2019-11-07 ENCOUNTER — Encounter (INDEPENDENT_AMBULATORY_CARE_PROVIDER_SITE_OTHER): Payer: Self-pay | Admitting: Nurse Practitioner

## 2019-11-07 VITALS — BP 146/80 | HR 69 | Resp 16 | Wt 184.0 lb

## 2019-11-07 DIAGNOSIS — I83893 Varicose veins of bilateral lower extremities with other complications: Secondary | ICD-10-CM | POA: Diagnosis not present

## 2019-11-11 ENCOUNTER — Encounter (INDEPENDENT_AMBULATORY_CARE_PROVIDER_SITE_OTHER): Payer: Self-pay | Admitting: Nurse Practitioner

## 2019-11-11 NOTE — Progress Notes (Signed)
Varicose veins of bilateral  lower extremity with inflammation (454.1  I83.10) Current Plans   Indication: Patient presents with symptomatic varicose veins of the bilateral  lower extremity.   Procedure: Sclerotherapy using hypertonic saline mixed with 1% Lidocaine was performed on the bilateral lower extremity. Compression wraps were placed. The patient tolerated the procedure well. 

## 2019-11-14 ENCOUNTER — Ambulatory Visit (INDEPENDENT_AMBULATORY_CARE_PROVIDER_SITE_OTHER): Payer: Medicare Other

## 2019-11-14 VITALS — Ht 67.0 in | Wt 184.0 lb

## 2019-11-14 DIAGNOSIS — Z0000 Encounter for general adult medical examination without abnormal findings: Secondary | ICD-10-CM

## 2019-11-14 NOTE — Patient Instructions (Addendum)
Ms. Gitlin , Thank you for taking time to come for your Medicare Wellness Visit. I appreciate your ongoing commitment to your health goals. Please review the following plan we discussed and let me know if I can assist you in the future.   These are the goals we discussed: Goals      Patient Stated   .  Maintain Healthy Lifestyle (pt-stated)      Stay active Stay hydrated Healthy diet       This is a list of the screening recommended for you and due dates:  Health Maintenance  Topic Date Due  .  Hepatitis C: One time screening is recommended by Center for Disease Control  (CDC) for  adults born from [birth year] through 1963.   Never done  . Tetanus Vaccine  03/26/2019  . Flu Shot  11/24/2019  . DEXA scan (bone density measurement)  Completed  . COVID-19 Vaccine  Completed  . Pneumonia vaccines  Completed    Immunizations Immunization History  Administered Date(s) Administered  . Fluad Quad(high Dose 65+) 12/19/2018, 12/21/2018  . Influenza Split 01/19/2011, 03/26/2012  . Influenza, High Dose Seasonal PF 02/07/2017, 01/31/2018  . Influenza,inj,Quad PF,6+ Mos 02/07/2013, 01/22/2015, 02/01/2016  . Influenza-Unspecified 02/06/2014  . Pneumococcal Conjugate-13 09/27/2013  . Pneumococcal Polysaccharide-23 04/12/2009  . Zoster 02/03/2011  . Zoster Recombinat (Shingrix) 06/11/2018   Advanced directives:   Conditions/risks identified: none new  Follow up in one year for your annual wellness visit   Annual follow up 11/26/19 @ 11:30.    Preventive Care 36 Years and Older, Female Preventive care refers to lifestyle choices and visits with your health care provider that can promote health and wellness. What does preventive care include?  A yearly physical exam. This is also called an annual well check.  Dental exams once or twice a year.  Routine eye exams. Ask your health care provider how often you should have your eyes checked.  Personal lifestyle choices, including:  Daily  care of your teeth and gums.  Regular physical activity.  Eating a healthy diet.  Avoiding tobacco and drug use.  Limiting alcohol use.  Practicing safe sex.  Taking low-dose aspirin every day.  Taking vitamin and mineral supplements as recommended by your health care provider. What happens during an annual well check? The services and screenings done by your health care provider during your annual well check will depend on your age, overall health, lifestyle risk factors, and family history of disease. Counseling  Your health care provider may ask you questions about your:  Alcohol use.  Tobacco use.  Drug use.  Emotional well-being.  Home and relationship well-being.  Sexual activity.  Eating habits.  History of falls.  Memory and ability to understand (cognition).  Work and work Statistician.  Reproductive health. Screening  You may have the following tests or measurements:  Height, weight, and BMI.  Blood pressure.  Lipid and cholesterol levels. These may be checked every 5 years, or more frequently if you are over 85 years old.  Skin check.  Lung cancer screening. You may have this screening every year starting at age 79 if you have a 30-pack-year history of smoking and currently smoke or have quit within the past 15 years.  Fecal occult blood test (FOBT) of the stool. You may have this test every year starting at age 44.  Flexible sigmoidoscopy or colonoscopy. You may have a sigmoidoscopy every 5 years or a colonoscopy every 10 years starting at age 26.  Hepatitis C blood test.  Hepatitis B blood test.  Sexually transmitted disease (STD) testing.  Diabetes screening. This is done by checking your blood sugar (glucose) after you have not eaten for a while (fasting). You may have this done every 1-3 years.  Bone density scan. This is done to screen for osteoporosis. You may have this done starting at age 86.  Mammogram. This may be done every 1-2  years. Talk to your health care provider about how often you should have regular mammograms. Talk with your health care provider about your test results, treatment options, and if necessary, the need for more tests. Vaccines  Your health care provider may recommend certain vaccines, such as:  Influenza vaccine. This is recommended every year.  Tetanus, diphtheria, and acellular pertussis (Tdap, Td) vaccine. You may need a Td booster every 10 years.  Zoster vaccine. You may need this after age 92.  Pneumococcal 13-valent conjugate (PCV13) vaccine. One dose is recommended after age 37.  Pneumococcal polysaccharide (PPSV23) vaccine. One dose is recommended after age 42. Talk to your health care provider about which screenings and vaccines you need and how often you need them. This information is not intended to replace advice given to you by your health care provider. Make sure you discuss any questions you have with your health care provider. Document Released: 05/08/2015 Document Revised: 12/30/2015 Document Reviewed: 02/10/2015 Elsevier Interactive Patient Education  2017 [place] Prevention in the Home Falls can cause injuries. They can happen to people of all ages. There are many things you can do to make your home safe and to help prevent falls. What can I do on the outside of my home?  Regularly fix the edges of walkways and driveways and fix any cracks.  Remove anything that might make you trip as you walk through a door, such as a raised step or threshold.  Trim any bushes or trees on the path to your home.  Use bright outdoor lighting.  Clear any walking paths of anything that might make someone trip, such as rocks or tools.  Regularly check to see if handrails are loose or broken. Make sure that both sides of any steps have handrails.  Any raised decks and porches should have guardrails on the edges.  Have any leaves, snow, or ice cleared regularly.  Use  sand or salt on walking paths during winter.  Clean up any spills in your garage right away. This includes oil or grease spills. What can I do in the bathroom?  Use night lights.  Install grab bars by the toilet and in the tub and shower. Do not use towel bars as grab bars.  Use non-skid mats or decals in the tub or shower.  If you need to sit down in the shower, use a plastic, non-slip stool.  Keep the floor dry. Clean up any water that spills on the floor as soon as it happens.  Remove soap buildup in the tub or shower regularly.  Attach bath mats securely with double-sided non-slip rug tape.  Do not have throw rugs and other things on the floor that can make you trip. What can I do in the bedroom?  Use night lights.  Make sure that you have a light by your bed that is easy to reach.  Do not use any sheets or blankets that are too big for your bed. They should not hang down onto the floor.  Have a firm chair that has side  arms. You can use this for support while you get dressed.  Do not have throw rugs and other things on the floor that can make you trip. What can I do in the kitchen?  Clean up any spills right away.  Avoid walking on wet floors.  Keep items that you use a lot in easy-to-reach places.  If you need to reach something above you, use a strong step stool that has a grab bar.  Keep electrical cords out of the way.  Do not use floor polish or wax that makes floors slippery. If you must use wax, use non-skid floor wax.  Do not have throw rugs and other things on the floor that can make you trip. What can I do with my stairs?  Do not leave any items on the stairs.  Make sure that there are handrails on both sides of the stairs and use them. Fix handrails that are broken or loose. Make sure that handrails are as long as the stairways.  Check any carpeting to make sure that it is firmly attached to the stairs. Fix any carpet that is loose or worn.  Avoid  having throw rugs at the top or bottom of the stairs. If you do have throw rugs, attach them to the floor with carpet tape.  Make sure that you have a light switch at the top of the stairs and the bottom of the stairs. If you do not have them, ask someone to add them for you. What else can I do to help prevent falls?  Wear shoes that:  Do not have high heels.  Have rubber bottoms.  Are comfortable and fit you well.  Are closed at the toe. Do not wear sandals.  If you use a stepladder:  Make sure that it is fully opened. Do not climb a closed stepladder.  Make sure that both sides of the stepladder are locked into place.  Ask someone to hold it for you, if possible.  Clearly mark and make sure that you can see:  Any grab bars or handrails.  First and last steps.  Where the edge of each step is.  Use tools that help you move around (mobility aids) if they are needed. These include:  Canes.  Walkers.  Scooters.  Crutches.  Turn on the lights when you go into a dark area. Replace any light bulbs as soon as they burn out.  Set up your furniture so you have a clear path. Avoid moving your furniture around.  If any of your floors are uneven, fix them.  If there are any pets around you, be aware of where they are.  Review your medicines with your doctor. Some medicines can make you feel dizzy. This can increase your chance of falling. Ask your doctor what other things that you can do to help prevent falls. This information is not intended to replace advice given to you by your health care provider. Make sure you discuss any questions you have with your health care provider. Document Released: 02/05/2009 Document Revised: 09/17/2015 Document Reviewed: 05/16/2014 Elsevier Interactive Patient Education  2017 [employer].

## 2019-11-14 NOTE — Progress Notes (Signed)
Subjective:   Sabrina Wilson is a 79 y.o. female who presents for Medicare Annual (Subsequent) preventive examination.  Review of Systems    No ROS.  Medicare Wellness Virtual Visit.   Cardiac Risk Factors include: advanced age (>41men, >66 women);hypertension     Objective:    Today's Vitals   11/14/19 1102  Weight: 184 lb (83.5 kg)  Height: 5\' 7"  (1.702 m)   Body mass index is 28.82 kg/m.  Advanced Directives 11/14/2019 11/13/2018 11/08/2017 09/13/2017 06/19/2017 06/17/2016 06/17/2015  Does Patient Have a Medical Advance Directive? Yes Yes No No No No No  Type of Paramedic of [place];Living will [place];Living will - - - - -  Does patient want to make changes to medical advance directive? No - Patient declined No - Patient declined - - - - -  Copy of [place] in Chart? No - copy requested No - copy requested - - - - -  Would patient like information on creating a medical advance directive? - - - No - Patient declined No - Patient declined No - Patient declined Yes - Educational materials given    Current Medications (verified) Outpatient Encounter Medications as of 11/14/2019  Medication Sig  . amLODipine (NORVASC) 5 MG tablet Take 1 tablet (5 mg total) by mouth daily.  Marland Kitchen atorvastatin (LIPITOR) 10 MG tablet Take 1 tablet (10 mg total) by mouth daily.  Marland Kitchen losartan (COZAAR) 50 MG tablet TAKE TWO TABLETS BY MOUTH DAILY (Patient not taking: Reported on 10/08/2019)  . Omega-3 Fatty Acids (FISH OIL PO) Take by mouth.  Marland Kitchen VITAMIN D, CHOLECALCIFEROL, PO Take 2,000 Units by mouth daily.   No facility-administered encounter medications on file as of 11/14/2019.    Allergies (verified) Sulfa antibiotics   History: Past Medical History:  Diagnosis Date  . Arthritis   . Colon cancer [facility]) 2009   T3,N0; s/p resection  Dr. Hulda Humphrey and chemotherapy by Dr. Cynda Acres  . Hernia of flank   . Hypertension   . Neuropathy   . Polyp  at cervical os    s/p resection Dr. Sabra Heck   Past Surgical History:  Procedure Laterality Date  . BREAST BIOPSY Right 05/2014   Dr. Dwyane Luo office-benign  . BREAST SURGERY Right February 2016   Vacuum assisted biopsy for recurrent cyst, fibrocystic changes without evidence of malignancy.  Marland Kitchen CATARACT EXTRACTION W/PHACO Left 01/13/2015   Procedure: CATARACT EXTRACTION PHACO AND INTRAOCULAR LENS PLACEMENT (IOC);  Surgeon: Birder Robson, MD;  Location: [facility] ORS;  Service: Ophthalmology;  Laterality: Left;  [place]  1:02.9 AP   19.2 CDE  12.06 casette lot #  [id number]  . CATARACT EXTRACTION W/PHACO Right 02/03/2015   Procedure: CATARACT EXTRACTION PHACO AND INTRAOCULAR LENS PLACEMENT ([place]);  Surgeon: Birder Robson, MD;  Location: [facility] ORS;  Service: Ophthalmology;  Laterality: Right;  [id number] [id number] [id number]  . COLECTOMY    . COLONOSCOPY  2015   Dr Candace Cruise  . COLONOSCOPY WITH PROPOFOL N/A 09/13/2017   Procedure: COLONOSCOPY WITH PROPOFOL;  Surgeon: Robert Bellow, MD;  Location[facility] ENDOSCOPY;  Service: Endoscopy;  Laterality: N/A;  . COLONOSCOPY WITH PROPOFOL N/A 11/08/2017   Procedure: COLONOSCOPY WITH PROPOFOL;  Surgeon: Robert Bellow, MD;  Location: [facility] ENDOSCOPY;  Service: Endoscopy;  Laterality: N/A;  . colostomy reversal    . DILATION AND CURETTAGE OF UTERUS  2014   Dr. Sabra Heck, benign per pt  . EYE SURGERY    . HERNIA REPAIR  07/13/12   ventral   . TONSILLECTOMY     Family History  Problem Relation Age of Onset  . Stroke Mother   . Diabetes Mother   . Cancer Father        colon  . Cancer Brother        colon  . Cancer Brother        brain   Social History   Socioeconomic History  . Marital status: Married    Spouse name: Not on file  . Number of children: Not on file  . Years of education: Not on file  . Highest education level: Not on file  Occupational History  . Not on file  Tobacco Use  . Smoking status: Former Smoker    Quit date: 10/12/2007    Years  since quitting: 12.0  . Smokeless tobacco: Never Used  Vaping Use  . Vaping Use: Never used  Substance and Sexual Activity  . Alcohol use: Yes    Alcohol/week: 1.0 standard drink    Types: 1 Standard drinks or equivalent per week    Comment: with dinner  . Drug use: No  . Sexual activity: Not on file  Other Topics Concern  . Not on file  Social History Narrative   Lives in [place] with husband. 2 children, son lives nearby.      Diet - regular      Exercise - none   Social Determinants of Health   Financial Resource Strain:   . Difficulty of Paying Living Expenses:   Food Insecurity:   . Worried About Charity fundraiser in the Last Year:   . Arboriculturist in the Last Year:   Transportation Needs:   . Film/video editor (Medical):   Marland Kitchen Lack of Transportation (Non-Medical):   Physical Activity:   . Days of Exercise per Week:   . Minutes of Exercise per Session:   Stress:   . Feeling of Stress :   Social Connections:   . Frequency of Communication with Friends and Family:   . Frequency of Social Gatherings with Friends and Family:   . Attends Religious Services:   . Active Member of Clubs or Organizations:   . Attends Archivist Meetings:   Marland Kitchen Marital Status:     Tobacco Counseling Counseling given: Not Answered   Clinical Intake:  Pre-visit preparation completed: Yes        Diabetes: No  How often do you need to have someone help you when you read instructions, pamphlets, or other written materials from your doctor or pharmacy?: 1 - Never   Interpreter Needed?: No      Activities of Daily Living In your present state of health, do you have any difficulty performing the following activities: 11/14/2019  Hearing? N  Vision? N  Difficulty concentrating or making decisions? N  Walking or climbing stairs? N  Dressing or bathing? N  Doing errands, shopping? N  Preparing Food and eating ? N  Using the Toilet? N  In the past six  months, have you accidently leaked urine? N  Do you have problems with loss of bowel control? N  Managing your Medications? N  Managing your Finances? N  Housekeeping or managing your Housekeeping? N  Some recent data might be hidden    Patient Care Team: Leone Haven, MD as PCP - General (Family Medicine) Bary Castilla, Forest Gleason, MD as Consulting Physician (General Surgery) Dallas Schimke, MD (Internal Medicine)  Indicate  any recent Medical Services you may have received from other than [facility] providers in the past year (date may be approximate).     Assessment:   This is a routine wellness examination for Tyleigh.  I connected with Shekia today by telephone and verified that I am speaking with the correct person using two identifiers. Location patient: home Location provider: work Persons participating in the virtual visit: patient, Marine scientist.    I discussed the limitations, risks, security and privacy concerns of performing an evaluation and management service by telephone and the availability of in person appointments. The patient expressed understanding and verbally consented to this telephonic visit.    Interactive audio and video telecommunications were attempted between this provider and patient, however failed, due to patient having technical difficulties OR patient did not have access to video capability.  We continued and completed visit with audio only.  Some vital signs may be absent or patient reported.   Hearing/Vision screen  Hearing Screening   125Hz  250Hz  500Hz  1000Hz  2000Hz  3000Hz  4000Hz  6000Hz  8000Hz   Right ear:           Left ear:           Comments: Patient is able to hear conversational tones without difficulty.  No issues reported.  Vision Screening Comments: Followed by [facility] Wears readers lenses Cataract extraction, bilateral Visual acuity not assessed, virtual visit.  They have seen their ophthalmologist in the last 12 months.      Dietary issues and exercise activities discussed: Current Exercise Habits: Home exercise routine, Intensity: Mild  Goals      Patient Stated   .  Maintain Healthy Lifestyle (pt-stated)      Stay active Stay hydrated Healthy diet      Depression Screen PHQ 2/9 Scores 11/14/2019 11/13/2018 05/09/2018 07/24/2017 07/12/2016 03/31/2014 03/25/2013  PHQ - 2 Score 0 0 0 0 0 0 0    Fall Risk Fall Risk  11/14/2019 11/13/2018 08/10/2018 05/09/2018 07/24/2017  Falls in the past year? 0 0 1 0 No  Number falls in past yr: 0 - 1 0 -  Injury with Fall? 0 - 1 0 -  Risk for fall due to : - - History of fall(s) - -  Follow up Falls evaluation completed - - - -    Handrails in use when climbing stairs? Yes  Home free of loose throw rugs in walkways, pet beds, electrical cords, etc? Yes  Adequate lighting in your home to reduce risk of falls? Yes   ASSISTIVE DEVICES UTILIZED TO PREVENT FALLS:  Life alert? No  Use of a cane, walker or w/c? No  Grab bars in the bathroom? No  Shower chair or bench in shower? No  Elevated toilet seat or a handicapped toilet? Yes   TIMED UP AND GO:  Was the test performed? No .   Cognitive Function: MMSE - Mini Mental State Exam 11/14/2019  Not completed: Unable to complete     6CIT Screen 11/13/2018  What Year? 0 points  What month? 0 points  What time? 0 points  Count back from 20 0 points  Months in reverse 0 points  Repeat phrase 0 points  Total Score 0    Immunizations Immunization History  Administered Date(s) Administered  . Fluad Quad(high Dose 65+) 12/19/2018, 12/21/2018  . Influenza Split 01/19/2011, 03/26/2012  . Influenza, High Dose Seasonal PF 02/03/2014, 02/07/2017, 01/31/2018  . Influenza,inj,Quad PF,6+ Mos 02/07/2013, 01/22/2015, 02/01/2016  . Influenza-Unspecified 02/06/2014  . Moderna SARS-COVID-2  Vaccination 05/03/2019, 05/31/2019  . Pneumococcal Conjugate-13 09/27/2013  . Pneumococcal Polysaccharide-23 04/12/2009  . Zoster  02/03/2011  . Zoster Recombinat (Shingrix) 06/11/2018, 08/30/2018   Health Maintenance Health Maintenance  Topic Date Due  . Hepatitis C Screening  Never done  . TETANUS/TDAP  03/26/2019  . INFLUENZA VACCINE  11/24/2019  . DEXA SCAN  Completed  . COVID-19 Vaccine  Completed  . PNA vac Low Risk Adult  Completed   Hep C Screening- consent given, add to next lab draw.   Tdap- due, discussed the importance of this vaccine. Patient aware this vaccine may be received at local pharmacy or health department. Agrees to update immunization record if completed any place other han the office.   Dental Screening: Recommended annual dental exams for proper oral hygiene. Visits every 6 months.   Community Resource Referral / Chronic Care Management: CRR required this visit?  No   CCM required this visit?  No      Plan:   Keep all routine maintenance appointments.   Annual follow up 11/26/19 @ 11:30.  Hepatitis C Screening consent given to add to labs. Patient will be fasting 8 hours with only water or black coffee.  I have personally reviewed and noted the following in the patient's chart:   . Medical and social history . Use of alcohol, tobacco or illicit drugs  . Current medications and supplements . Functional ability and status . Nutritional status . Physical activity . Advanced directives . List of other physicians . Hospitalizations, surgeries, and ER visits in previous 12 months . Vitals . Screenings to include cognitive, depression, and falls . Referrals and appointments  In addition, I have reviewed and discussed with patient certain preventive protocols, quality metrics, and best practice recommendations. A written personalized care plan for preventive services as well as general preventive health recommendations were provided to patient via mychart.     Varney Biles, LPN   [id number]/60/[id number]

## 2019-11-26 ENCOUNTER — Ambulatory Visit (INDEPENDENT_AMBULATORY_CARE_PROVIDER_SITE_OTHER): Payer: Medicare Other | Admitting: Family Medicine

## 2019-11-26 ENCOUNTER — Encounter: Payer: Self-pay | Admitting: Family Medicine

## 2019-11-26 ENCOUNTER — Other Ambulatory Visit: Payer: Self-pay

## 2019-11-26 VITALS — BP 150/70 | HR 75 | Temp 98.5°F | Ht 66.0 in | Wt 185.0 lb

## 2019-11-26 DIAGNOSIS — Z0001 Encounter for general adult medical examination with abnormal findings: Secondary | ICD-10-CM

## 2019-11-26 DIAGNOSIS — Z780 Asymptomatic menopausal state: Secondary | ICD-10-CM

## 2019-11-26 DIAGNOSIS — R21 Rash and other nonspecific skin eruption: Secondary | ICD-10-CM | POA: Insufficient documentation

## 2019-11-26 DIAGNOSIS — E785 Hyperlipidemia, unspecified: Secondary | ICD-10-CM | POA: Diagnosis not present

## 2019-11-26 DIAGNOSIS — R7303 Prediabetes: Secondary | ICD-10-CM

## 2019-11-26 DIAGNOSIS — Z1159 Encounter for screening for other viral diseases: Secondary | ICD-10-CM | POA: Diagnosis not present

## 2019-11-26 DIAGNOSIS — Z789 Other specified health status: Secondary | ICD-10-CM

## 2019-11-26 LAB — COMPREHENSIVE METABOLIC PANEL
ALT: 16 U/L (ref 0–35)
AST: 19 U/L (ref 0–37)
Albumin: 4.4 g/dL (ref 3.5–5.2)
Alkaline Phosphatase: 66 U/L (ref 39–117)
BUN: 17 mg/dL (ref 6–23)
CO2: 30 mEq/L (ref 19–32)
Calcium: 9.7 mg/dL (ref 8.4–10.5)
Chloride: 101 mEq/L (ref 96–112)
Creatinine, Ser: 0.8 mg/dL (ref 0.40–1.20)
GFR: 69.59 mL/min (ref >60.00)
Glucose, Bld: 105 mg/dL — ABNORMAL HIGH (ref 70–99)
Potassium: 4 mEq/L (ref 3.5–5.1)
Sodium: 137 mEq/L (ref 135–145)
Total Bilirubin: 0.7 mg/dL (ref 0.2–1.2)
Total Protein: 7 g/dL (ref 6.0–8.3)

## 2019-11-26 LAB — LIPID PANEL
Cholesterol: 205 mg/dL — ABNORMAL HIGH (ref 0–200)
HDL: 111.8 mg/dL (ref >39.00)
LDL Cholesterol: 81 mg/dL (ref 0–99)
NonHDL: 93.08
Total CHOL/HDL Ratio: 2
Triglycerides: 58 mg/dL (ref 0.0–149.0)
VLDL: 11.6 mg/dL (ref 0.0–40.0)

## 2019-11-26 LAB — HEMOGLOBIN A1C: Hgb A1c MFr Bld: 6.1 % (ref 4.6–6.5)

## 2019-11-26 MED ORDER — TRIAMCINOLONE ACETONIDE 0.1 % EX CREA
1.0000 | TOPICAL_CREAM | Freq: Two times a day (BID) | CUTANEOUS | 0 refills | Status: DC
Start: 2019-11-26 — End: 2020-09-14

## 2019-11-26 MED ORDER — TETANUS-DIPHTH-ACELL PERTUSSIS 5-2.5-18.5 LF-MCG/0.5 IM SUSP: 0.5000 mL | Freq: Once | INTRAMUSCULAR | 0 refills | Status: AC

## 2019-11-26 NOTE — Assessment & Plan Note (Addendum)
Physical exam completed.  Encouraged healthy diet.  Encourage staying active.  Recommended keeping her colonoscopy up-to-date given her personal and family history.  We will screen for hepatitis C.  She wonders about hepatitis B vaccine and we will see if she is immune.  Tetanus vaccine to be given at the pharmacy.  Encouraged her to see an eye doctor.  She will call to schedule her bone density scan.  Blood pressure mildly elevated though she reports not typically elevated at home.  She will check it daily for the next week and send [place] her readings.  Lab work as outlined below.

## 2019-11-26 NOTE — Patient Instructions (Addendum)
Nice to see you. Please try to stay active and avoid fatty foods. Please call to schedule your bone density scan. Please get your tetanus vaccine at the pharmacy. We will get lab work today and contact you with the results. Please try the triamcinolone on the rash.  If it does not resolve in the next 1 to 2 weeks please contact your dermatologist.

## 2019-11-26 NOTE — Progress Notes (Signed)
Tommi Rumps, MD Phone: [phone]  Sabrina Wilson is a 79 y.o. female who presents today for CPE.  Diet: Avoids fried fatty foods.  Does eat some sweets.  Gets plenty of fruits and vegetables.  No soda or sweet tea. Exercise: No specific exercise though does stay active. Pap smear: Aged out Colonoscopy: 11/08/2017 with tubular adenomas and hyperplastic polyp, 5-year recall Mammogram: 08/22/2019.  Negative. Family history-  Colon cancer: Father, brother  Breast cancer: No  Ovarian cancer: No Menses: Postmenopausal Vaccines-   Flu: She will get when this becomes available  Tetanus: Due  Shingles: Up-to-date  COVID19: Up-to-date  Pneumonia: Up-to-date Hep C Screening: Due Tobacco use: No Alcohol use: Occasionally has a glass of wine Illicit Drug use: No Dentist: Yes Ophthalmology: Due  Patient reports rash on her lower legs.  The one on her left leg has improved over the course of 3 months.  She has 2 spots on the posterior lower aspect of her right leg which been present for about a month.  She does not remember getting bitten by anything.  She has been using hydrocortisone on it.   Active Ambulatory Problems    Diagnosis Date Noted  . Hypertension 12/18/2010  . Personal history of colon cancer 11/11/2011  . Ventral hernia 07/26/2012  . Medicare annual wellness visit, subsequent 03/25/2013  . Postmenopausal estrogen deficiency 03/25/2013  . Lipoma of neck 09/27/2013  . Encounter for general adult medical examination with abnormal findings 04/06/2016  . Multiple nevi 04/06/2016  . Right lateral epicondylitis 04/06/2016  . Allergic rhinitis 04/06/2016  . Neuropathy 04/06/2016  . Hyperlipidemia 05/02/2016  . Prediabetes 05/02/2016  . Cancer of sigmoid ([place]) 06/17/2016  . Plantar fasciitis of right foot 07/25/2017  . History of colonic polyps 08/23/2017  . GERD (gastroesophageal reflux disease) 01/31/2018  . Varicose veins of bilateral lower extremities with other  complications 62/06/5595  . Bilateral lower extremity edema 02/13/2018  . Skin lesion 08/10/2018  . Chronic bilateral low back pain without sciatica 12/14/2018  . Rash 11/26/2019   Resolved Ambulatory Problems    Diagnosis Date Noted  . Medicare annual wellness visit, initial 04/12/2012  . Abscess, trunk 04/12/2012  . Breast cyst 04/14/2014  . Viral URI 09/30/2014  . Breast tenderness in female 04/06/2016  . Clear vaginal discharge 04/06/2016   Past Medical History:  Diagnosis Date  . Arthritis   . Colon cancer ([place]) 2009  . Hernia of flank   . Polyp at cervical os     Family History  Problem Relation Age of Onset  . Stroke Mother   . Diabetes Mother   . Cancer Father        colon  . Cancer Brother        colon  . Cancer Brother        brain    Social History   Socioeconomic History  . Marital status: Married    Spouse name: Not on file  . Number of children: Not on file  . Years of education: Not on file  . Highest education level: Not on file  Occupational History  . Not on file  Tobacco Use  . Smoking status: Former Smoker    Quit date: 10/12/2007    Years since quitting: 12.1  . Smokeless tobacco: Never Used  Vaping Use  . Vaping Use: Never used  Substance and Sexual Activity  . Alcohol use: Yes    Alcohol/week: 1.0 standard drink    Types: 1 Standard drinks or  equivalent per week    Comment: with dinner  . Drug use: No  . Sexual activity: Not on file  Other Topics Concern  . Not on file  Social History Narrative   Lives in [place] with husband. 2 children, son lives nearby.      Diet - regular      Exercise - none   Social Determinants of Health   Financial Resource Strain:   . Difficulty of Paying Living Expenses:   Food Insecurity:   . Worried About Charity fundraiser in the Last Year:   . Arboriculturist in the Last Year:   Transportation Needs:   . Film/video editor (Medical):   Marland Kitchen Lack of Transportation (Non-Medical):     Physical Activity:   . Days of Exercise per Week:   . Minutes of Exercise per Session:   Stress:   . Feeling of Stress :   Social Connections:   . Frequency of Communication with Friends and Family:   . Frequency of Social Gatherings with Friends and Family:   . Attends Religious Services:   . Active Member of Clubs or Organizations:   . Attends Archivist Meetings:   Marland Kitchen Marital Status:   Intimate Partner Violence:   . Fear of Current or Ex-Partner:   . Emotionally Abused:   Marland Kitchen Physically Abused:   . Sexually Abused:     ROS  General:  Negative for nexplained weight loss, fever Skin: Positive for lesion that will not heal, negative for new or changing mole HEENT: Negative for trouble hearing, trouble seeing, ringing in ears, mouth sores, hoarseness, change in voice, dysphagia. CV:  Negative for chest pain, dyspnea, edema, palpitations Resp: Negative for cough, dyspnea, hemoptysis GI: Negative for nausea, vomiting, diarrhea, constipation, abdominal pain, melena, hematochezia. GU: Negative for dysuria, incontinence, urinary hesitance, hematuria, vaginal or penile discharge, polyuria, sexual difficulty, lumps in testicle or breasts MSK: Positive for muscle cramps or aches, joint pain or swelling Neuro: Negative for headaches, weakness, numbness, dizziness, passing out/fainting Psych: Negative for depression, anxiety, memory problems  Objective  Physical Exam Vitals:   11/26/19 1135  BP: (!) 150/70  Pulse: 75  Temp: 98.5 F (36.9 C)  SpO2: 97%    BP Readings from Last 3 Encounters:  11/26/19 (!) 150/70  11/07/19 (!) 146/80  10/08/19 (!) 167/80   Wt Readings from Last 3 Encounters:  11/26/19 185 lb (83.9 kg)  11/14/19 184 lb (83.5 kg)  11/07/19 184 lb (83.5 kg)    Physical Exam Constitutional:      General: She is not in acute distress.    Appearance: She is not diaphoretic.  HENT:     Head: Normocephalic and atraumatic.  Eyes:      Conjunctiva/sclera: Conjunctivae normal.     Pupils: Pupils are equal, round, and reactive to light.  Neck:   Cardiovascular:     Rate and Rhythm: Normal rate and regular rhythm.     Heart sounds: Normal heart sounds.  Pulmonary:     Effort: Pulmonary effort is normal.     Breath sounds: Normal breath sounds.  Abdominal:     General: Bowel sounds are normal. There is no distension.     Palpations: Abdomen is soft.     Tenderness: There is no abdominal tenderness. There is no guarding or rebound.  Genitourinary:    Comments: Fulton Mole, CMA served as chaperone, bilateral breast with no skin changes, nipple inversion, masses, or tenderness, no axillary  masses bilaterally Musculoskeletal:     Right lower leg: No edema.     Left lower leg: No edema.  Lymphadenopathy:     Cervical: No cervical adenopathy.  Skin:    General: Skin is warm and dry.       Neurological:     Mental Status: She is alert.  Psychiatric:        Mood and Affect: Mood normal.      Assessment/Plan:   Encounter for general adult medical examination with abnormal findings Physical exam completed.  Encouraged healthy diet.  Encourage staying active.  Recommended keeping her colonoscopy up-to-date given her personal and family history.  We will screen for hepatitis C.  She wonders about hepatitis B vaccine and we will see if she is immune.  Tetanus vaccine to be given at the pharmacy.  Encouraged her to see an eye doctor.  She will call to schedule her bone density scan.  Blood pressure mildly elevated though she reports not typically elevated at home.  She will check it daily for the next week and send [place] her readings.  Lab work as outlined below.  Rash We will trial triamcinolone.  If not improving in the next 1 to 2 weeks she will contact her dermatologist for evaluation.   Orders Placed This Encounter  Procedures  . DG Bone Density    Standing Status:   Future    Standing Expiration Date:   11/25/2020     Order Specific Question:   Reason for Exam (SYMPTOM  OR DIAGNOSIS REQUIRED)    Answer:   postmenopausal estrogen deficiency    Order Specific Question:   Preferred imaging location?    Answer:   [facility]  . Lipid panel  . HgB A1c  . Comp Met (CMET)  . Hepatitis B Surface AntiGEN  . Hepatitis B Core Antibody, total  . Hepatitis C Antibody  . Hepatitis B surface antibody,qualitative    Meds ordered this encounter  Medications  . triamcinolone cream (KENALOG) 0.1 %    Sig: Apply 1 application topically 2 (two) times daily.    Dispense:  30 g    Refill:  0  . Tdap (BOOSTRIX) 5-2.5-18.5 LF-MCG/0.5 injection    Sig: Inject 0.5 mLs into the muscle once for 1 dose.    Dispense:  0.5 mL    Refill:  0    This visit occurred during the SARS-CoV-2 public health emergency.  Safety protocols were in place, including screening questions prior to the visit, additional usage of staff PPE, and extensive cleaning of exam room while observing appropriate contact time as indicated for disinfecting solutions.    Tommi Rumps, MD [place]

## 2019-11-26 NOTE — Assessment & Plan Note (Signed)
We will trial triamcinolone.  If not improving in the next 1 to 2 weeks she will contact her dermatologist for evaluation.

## 2019-11-27 LAB — HEPATITIS B SURFACE ANTIBODY,QUALITATIVE: Hep B S Ab: NONREACTIVE

## 2019-11-27 LAB — HEPATITIS C ANTIBODY
Hepatitis C Ab: NONREACTIVE
SIGNAL TO CUT-OFF: 0 (ref <1.00)

## 2019-11-27 LAB — HEPATITIS B CORE ANTIBODY, TOTAL: Hep B Core Total Ab: NONREACTIVE

## 2019-11-27 LAB — HEPATITIS B SURFACE ANTIGEN: Hepatitis B Surface Ag: NONREACTIVE

## 2019-12-10 ENCOUNTER — Encounter (INDEPENDENT_AMBULATORY_CARE_PROVIDER_SITE_OTHER): Payer: Self-pay | Admitting: Nurse Practitioner

## 2019-12-10 ENCOUNTER — Other Ambulatory Visit: Payer: Self-pay

## 2019-12-10 ENCOUNTER — Ambulatory Visit (INDEPENDENT_AMBULATORY_CARE_PROVIDER_SITE_OTHER): Payer: Medicare Other | Admitting: Nurse Practitioner

## 2019-12-10 VITALS — BP 154/84 | HR 70 | Resp 16

## 2019-12-10 DIAGNOSIS — I83893 Varicose veins of bilateral lower extremities with other complications: Secondary | ICD-10-CM | POA: Diagnosis not present

## 2019-12-10 NOTE — Progress Notes (Signed)
Varicose veins of bilateral  lower extremity with inflammation (454.1  I83.10) Current Plans   Indication: Patient presents with symptomatic varicose veins of the bilateral  lower extremity.   Procedure: Sclerotherapy using hypertonic saline mixed with 1% Lidocaine was performed on the bilateral lower extremity. Compression wraps were placed. The patient tolerated the procedure well. 

## 2019-12-11 ENCOUNTER — Ambulatory Visit (INDEPENDENT_AMBULATORY_CARE_PROVIDER_SITE_OTHER): Payer: Medicare Other

## 2019-12-11 DIAGNOSIS — Z23 Encounter for immunization: Secondary | ICD-10-CM | POA: Diagnosis not present

## 2019-12-11 NOTE — Progress Notes (Signed)
Patient presented for Hep B injection to left deltoid, patient voiced no concerns nor showed any signs of distress during injection. 

## 2019-12-12 ENCOUNTER — Encounter: Payer: Self-pay | Admitting: Family Medicine

## 2019-12-16 ENCOUNTER — Other Ambulatory Visit: Payer: Self-pay

## 2019-12-16 ENCOUNTER — Ambulatory Visit
Admission: RE | Admit: 2019-12-16 | Discharge: 2019-12-16 | Disposition: A | Discharge status: Home / Self Care | Payer: Medicare Other | Source: Ambulatory Visit | Attending: Family Medicine | Admitting: Family Medicine

## 2019-12-16 ENCOUNTER — Other Ambulatory Visit: Payer: Self-pay | Admitting: Family Medicine

## 2019-12-16 DIAGNOSIS — R29890 Loss of height: Secondary | ICD-10-CM | POA: Diagnosis not present

## 2019-12-16 DIAGNOSIS — M85851 Other specified disorders of bone density and structure, right thigh: Secondary | ICD-10-CM | POA: Diagnosis not present

## 2019-12-16 DIAGNOSIS — Z85038 Personal history of other malignant neoplasm of large intestine: Secondary | ICD-10-CM | POA: Diagnosis not present

## 2019-12-16 DIAGNOSIS — Z780 Asymptomatic menopausal state: Secondary | ICD-10-CM | POA: Diagnosis not present

## 2019-12-27 ENCOUNTER — Telehealth: Payer: Self-pay | Admitting: Family Medicine

## 2019-12-27 DIAGNOSIS — L989 Disorder of the skin and subcutaneous tissue, unspecified: Secondary | ICD-10-CM

## 2019-12-27 NOTE — Telephone Encounter (Signed)
Patient called in stated that the medication that Dr.Sonnenberg prescribed for her leg is not working and now she has a red mole on her chest she would like to get the referral for the skin specialist Sorayah Schrodt,cma

## 2019-12-27 NOTE — Telephone Encounter (Signed)
Referral placed.

## 2019-12-27 NOTE — Telephone Encounter (Signed)
Patient called in stated that the medication that Dr.Sonnenberg prescribed for her leg is not working and now she has a red mole on her chest she would like to get the referral for the skin specialist

## 2020-01-07 ENCOUNTER — Ambulatory Visit (INDEPENDENT_AMBULATORY_CARE_PROVIDER_SITE_OTHER): Payer: Medicare Other | Admitting: Nurse Practitioner

## 2020-01-07 ENCOUNTER — Other Ambulatory Visit: Payer: Self-pay

## 2020-01-07 ENCOUNTER — Encounter (INDEPENDENT_AMBULATORY_CARE_PROVIDER_SITE_OTHER): Payer: Self-pay | Admitting: Nurse Practitioner

## 2020-01-07 VITALS — BP 151/82 | HR 71 | Resp 16 | Wt 187.0 lb

## 2020-01-07 DIAGNOSIS — I83893 Varicose veins of bilateral lower extremities with other complications: Secondary | ICD-10-CM

## 2020-01-07 NOTE — Progress Notes (Signed)
Varicose veins of bilateral  lower extremity with inflammation (454.1  I83.10) Current Plans   Indication: Patient presents with symptomatic varicose veins of the bilateral  lower extremity.   Procedure: Sclerotherapy using hypertonic saline mixed with 1% Lidocaine was performed on the right lower extremity. Compression wraps were placed. The patient tolerated the procedure well.

## 2020-01-14 ENCOUNTER — Other Ambulatory Visit: Payer: Self-pay

## 2020-01-14 ENCOUNTER — Ambulatory Visit (INDEPENDENT_AMBULATORY_CARE_PROVIDER_SITE_OTHER): Payer: Medicare Other

## 2020-01-14 DIAGNOSIS — Z23 Encounter for immunization: Secondary | ICD-10-CM

## 2020-01-14 NOTE — Progress Notes (Signed)
Patient presented for HEP B injection to right deltoid, patient voiced no concerns nor showed any signs of distress during injection. 

## 2020-01-22 ENCOUNTER — Ambulatory Visit (INDEPENDENT_AMBULATORY_CARE_PROVIDER_SITE_OTHER): Payer: Medicare Other | Admitting: Dermatology

## 2020-01-22 ENCOUNTER — Encounter: Payer: Self-pay | Admitting: Dermatology

## 2020-01-22 ENCOUNTER — Other Ambulatory Visit: Payer: Self-pay

## 2020-01-22 DIAGNOSIS — L720 Epidermal cyst: Secondary | ICD-10-CM

## 2020-01-22 DIAGNOSIS — L570 Actinic keratosis: Secondary | ICD-10-CM | POA: Diagnosis not present

## 2020-01-22 DIAGNOSIS — D485 Neoplasm of uncertain behavior of skin: Secondary | ICD-10-CM | POA: Diagnosis not present

## 2020-01-22 DIAGNOSIS — D489 Neoplasm of uncertain behavior, unspecified: Secondary | ICD-10-CM

## 2020-01-22 DIAGNOSIS — L821 Other seborrheic keratosis: Secondary | ICD-10-CM | POA: Diagnosis not present

## 2020-01-22 DIAGNOSIS — D492 Neoplasm of unspecified behavior of bone, soft tissue, and skin: Secondary | ICD-10-CM

## 2020-01-22 DIAGNOSIS — C4490 Unspecified malignant neoplasm of skin, unspecified: Secondary | ICD-10-CM

## 2020-01-22 HISTORY — DX: Unspecified malignant neoplasm of skin, unspecified: C44.90

## 2020-01-22 MED ORDER — MUPIROCIN 2 % EX OINT: 1.0000 "application " | TOPICAL_OINTMENT | Freq: Two times a day (BID) | CUTANEOUS | 0 refills | Status: DC

## 2020-01-22 NOTE — Progress Notes (Signed)
Follow-Up Visit   Subjective  Sabrina Wilson is a 79 y.o. female who presents for the following: Area of Concern (B/L lower leg has been present for past 5 to 6 months).  Patient has no skin cancer history. Patient states that areas of concern are rather itchy. Patient has been using TMC cream 0.1% for 1 month without relief.   She also notes two spots on her face that are white and irritating. She also notes a rough spot on top of her nose.   She has other spots on her legs and hand that are raised and she would like checked.  The following portions of the chart were reviewed this encounter and updated as appropriate:  Tobacco   Allergies   Meds   Problems   Med Hx   Surg Hx   Fam Hx       Review of Systems:  No other skin or systemic complaints except as noted in HPI or Assessment and Plan.  Objective  Well appearing patient in no apparent distress; mood and affect are within normal limits.  A focused examination was performed including face, neck, right arm and hand, bilateral lower legs. Relevant physical exam findings are noted in the Assessment and Plan.  Objective  Right Lateral Calf: 0.7 cm pink papule with multipule keratotic plugs     Objective  Nasal Supra tip: Erythematous thin papules/macules with gritty scale.   Objective  Left Lower Eyelid , Right nasal side wall: Smooth white papules  Objective  Right Lateral Calf: Lesion similar to NUB site x 1     Objective  Left Lateral Calf x 2 (2): Lesion similar to NUB site x 2        Assessment & Plan  Neoplasm of uncertain behavior Right Lateral Calf  Skin / nail biopsy Type of biopsy: tangential   Informed consent: discussed and consent obtained   Timeout: patient name, date of birth, surgical site, and procedure verified   Procedure prep:  Patient was prepped and draped in usual sterile fashion Prep type:  Isopropyl alcohol Anesthesia: the lesion was anesthetized in a standard fashion     Anesthetic:  1% lidocaine w/ epinephrine 1-100,000 buffered w/ 8.4% NaHCO3 Instrument used: flexible razor blade   Hemostasis achieved with: aluminum chloride   Outcome: patient tolerated procedure well   Post-procedure details: sterile dressing applied and wound care instructions given   Dressing type: bandage (mupirocin)    mupirocin ointment (BACTROBAN) 2 %  Specimen 1 - Surgical pathology Differential Diagnosis: R/O perforating disorder vs SCC vs other Check Margins: No 0.7 cm pink papule with multipule keratotic plugs R. Lateral calf  Shave biopsy today at R lateral calf Start Mupirocin ointment to wound daily and keep covered   Also has 3 more similar small lesions 1 at Right Calf and 2 at Left Calf  AK (actinic keratosis) Nasal Supra tip  Cryotherapy today Prior to procedure, discussed risks of blister formation, small wound, skin dyspigmentation, or rare scar following cryotherapy.    Destruction of lesion - Nasal Supra tip  Destruction method: cryotherapy   Informed consent: discussed and consent obtained   Lesion destroyed using liquid nitrogen: Yes   Cryotherapy cycles:  2 Outcome: patient tolerated procedure well with no complications   Post-procedure details: wound care instructions given    Milia (2) Left Lower Eyelid ; Right nasal side wall  Irritated Removal today   Acne/Milia surgery - Left Lower Eyelid , Right nasal side wall  Procedure risks and benefits were discussed with the patient and verbal consent was obtained. Following prep of the skin on the Right nasal side wall and Left lower eyelid with an alcohol swab, extraction of comedones was performed with a comedone extractor. Vaseline ointment was applied to each site. The patient tolerated the procedure well.  Neoplasm of skin (3) Right Lateral Calf  Left Lateral Calf x 2 (2)  Will recheck in 2 months  Seborrheic Keratoses at hands, arms, legs - Stuck-on, waxy, tan-brown papules and  plaques  - Discussed benign etiology and prognosis. - Observe - Call for any changes  Return in about 2 months (around 03/23/2020) for AK Follow up and re check r lower leg.  IDonzetta Kohut, CMA, am acting as scribe for Forest Gleason, MD .  Documentation: I have reviewed the above documentation for accuracy and completeness, and I agree with the above.  Forest Gleason, MD

## 2020-01-22 NOTE — Patient Instructions (Addendum)
Recommend daily broad spectrum sunscreen SPF 30+ to sun-exposed areas, reapply every 2 hours as needed. Call for new or changing lesions.  Prior to procedure, discussed risks of blister formation, small wound, skin dyspigmentation, or rare scar following cryotherapy.  Liquid nitrogen was applied for 10-12 seconds to the skin lesion and the expected blistering or scabbing reaction explained. Do not pick at the area. Patient reminded to expect hypopigmented scars from the procedure. Return if lesion fails to fully resolve.  Cryotherapy Aftercare  . Wash gently with soap and water everyday.   . Apply Vaseline and Band-Aid daily until healed.   Wound Care Instructions  1. Cleanse wound gently with soap and water once a day then pat dry with clean gauze. Apply a thing coat of Petrolatum (petroleum jelly, "Vaseline") over the wound (unless you have an allergy to this). We recommend that you use a new, sterile tube of Vaseline. Do not pick or remove scabs. Do not remove the yellow or white "healing tissue" from the base of the wound.  2. Cover the wound with fresh, clean, nonstick gauze and secure with paper tape. You may use Band-Aids in place of gauze and tape if the would is small enough, but would recommend trimming much of the tape off as there is often too much. Sometimes Band-Aids can irritate the skin.  3. You should call the office for your biopsy report after 1 week if you have not already been contacted.  4. If you experience any problems, such as abnormal amounts of bleeding, swelling, significant bruising, significant pain, or evidence of infection, please call the office immediately.  5. FOR ADULT SURGERY PATIENTS: If you need something for pain relief you may take 1 extra strength Tylenol (acetaminophen) AND 2 Ibuprofen (200mg each) together every 4 hours as needed for pain. (do not take these if you are allergic to them or if you have a reason you should not take them.) Typically, you  may only need pain medication for 1 to 3 days.     

## 2020-01-28 ENCOUNTER — Telehealth: Payer: Self-pay

## 2020-01-28 NOTE — Progress Notes (Signed)
Skin , right lateral calf SURFACE OF AN ATYPICAL VERRUCOUS SQUAMOUS PROLIFERATION  --> the pathologist was unable to determine for sure this is a wart or the top of a squamous cell skin cancer.  Recommend return to clinic for deeper biopsy of this lesion within the next 1 to 2 weeks (I believe there are some openings tomorrow).  We will also recheck the other similar spots on her legs.  Dr. Jerilynn Mages left voicemail 01/28/2020 at 12:50 PM. MAs please retry later in the day. Thank you!

## 2020-01-28 NOTE — Telephone Encounter (Signed)
Spoke with patient and informed her of issue with specimen. Scheduled pt for return visit for deeper biopsy. She had no questions.

## 2020-01-29 ENCOUNTER — Ambulatory Visit: Payer: Medicare Other | Admitting: Dermatology

## 2020-01-29 NOTE — Telephone Encounter (Signed)
Thank you :)

## 2020-02-12 ENCOUNTER — Encounter: Payer: Self-pay | Admitting: Dermatology

## 2020-02-12 ENCOUNTER — Ambulatory Visit: Payer: Medicare Other | Admitting: Dermatology

## 2020-02-12 ENCOUNTER — Other Ambulatory Visit: Payer: Self-pay

## 2020-02-12 DIAGNOSIS — D485 Neoplasm of uncertain behavior of skin: Secondary | ICD-10-CM | POA: Diagnosis not present

## 2020-02-12 DIAGNOSIS — L570 Actinic keratosis: Secondary | ICD-10-CM

## 2020-02-12 DIAGNOSIS — D489 Neoplasm of uncertain behavior, unspecified: Secondary | ICD-10-CM

## 2020-02-12 HISTORY — DX: Actinic keratosis: L57.0

## 2020-02-12 NOTE — Progress Notes (Signed)
   Follow-Up Visit   Subjective  Sabrina Wilson is a 79 y.o. female who presents for the following: Follow-up.  Patient presents for follow up on OV 01/22/20 for biopsy  R. Calf BX proven Surface of an  Atypical Verrucous Squamous Proliferation  that could not determine for sure if this was a wart to top of SCC. Will do shave removal today and also recheck other areas on legs at this time.   The following portions of the chart were reviewed this encounter and updated as appropriate:  Tobacco  Allergies  Meds  Problems  Med Hx  Surg Hx  Fam Hx      Review of Systems:  No other skin or systemic complaints except as noted in HPI or Assessment and Plan.  Objective  Well appearing patient in no apparent distress; mood and affect are within normal limits.  A focused examination was performed including lower extremities, including the legs, feet, toes, and toenails. Relevant physical exam findings are noted in the Assessment and Plan.  Objective  Right Lateral Calf: Scaly pink plaque   Assessment & Plan  Neoplasm of uncertain behavior Right Lateral Calf  Skin / nail biopsy Type of biopsy: punch   Informed consent: discussed and consent obtained   Timeout: patient name, date of birth, surgical site, and procedure verified   Procedure prep:  Patient was prepped and draped in usual sterile fashion Prep type:  Isopropyl alcohol Anesthesia: the lesion was anesthetized in a standard fashion   Anesthetic:  1% lidocaine w/ epinephrine 1-100,000 buffered w/ 8.4% NaHCO3 Punch size:  3 mm Suture size:  4-0 Suture type: Prolene (polypropylene)   Suture removal (days):  14 Hemostasis achieved with: suture   Outcome: patient tolerated procedure well   Post-procedure details: sterile dressing applied and wound care instructions given   Dressing type: petrolatum and pressure dressing    Specimen 1 - Surgical pathology Differential Diagnosis: R/O Wart vs SCC Check Margins: No Scaly  pink plaque  Previous Accession # [id number]  Re-biopsy R. Lateral calf R/O wart vs Scc  Previous Accession # [id number]  Other Related Medications mupirocin ointment (BACTROBAN) 2 %  Neoplasm of uncertain behavior - Favor verrucae at left and right calves. Lesions stable from prior.  - Will await result of repeat biopsy of R lateral calf lesion. If it is c/w verruca, will plan treatment of these lesions as well with LN2. If it shows SCC, will consider biopsy of these lesions as well  Return in about 2 weeks (around 02/26/2020) for suture removal.  I, Donzetta Kohut, CMA, am acting as scribe for Forest Gleason, MD .  Documentation: I have reviewed the above documentation for accuracy and completeness, and I agree with the above.  Forest Gleason, MD

## 2020-02-12 NOTE — Patient Instructions (Addendum)
Recommend daily broad spectrum sunscreen SPF 30+ to sun-exposed areas, reapply every 2 hours as needed. Call for new or changing lesions.  Wound Care Instructions  1. Cleanse wound gently with soap and water once a day then pat dry with clean gauze. Apply a thing coat of Petrolatum (petroleum jelly, "Vaseline") over the wound (unless you have an allergy to this). We recommend that you use a new, sterile tube of Vaseline. Do not pick or remove scabs. Do not remove the yellow or white "healing tissue" from the base of the wound.  2. Cover the wound with fresh, clean, nonstick gauze and secure with paper tape. You may use Band-Aids in place of gauze and tape if the would is small enough, but would recommend trimming much of the tape off as there is often too much. Sometimes Band-Aids can irritate the skin.  3. You should call the office for your biopsy report after 1 week if you have not already been contacted.  4. If you experience any problems, such as abnormal amounts of bleeding, swelling, significant bruising, significant pain, or evidence of infection, please call the office immediately.  5. FOR ADULT SURGERY PATIENTS: If you need something for pain relief you may take 1 extra strength Tylenol (acetaminophen) AND 2 Ibuprofen (200mg each) together every 4 hours as needed for pain. (do not take these if you are allergic to them or if you have a reason you should not take them.) Typically, you may only need pain medication for 1 to 3 days.     

## 2020-02-18 ENCOUNTER — Telehealth: Payer: Self-pay

## 2020-02-18 ENCOUNTER — Other Ambulatory Visit: Payer: Self-pay

## 2020-02-18 ENCOUNTER — Encounter (INDEPENDENT_AMBULATORY_CARE_PROVIDER_SITE_OTHER): Payer: Self-pay | Admitting: Nurse Practitioner

## 2020-02-18 ENCOUNTER — Ambulatory Visit (INDEPENDENT_AMBULATORY_CARE_PROVIDER_SITE_OTHER): Payer: Medicare Other | Admitting: Nurse Practitioner

## 2020-02-18 VITALS — BP 169/88 | HR 79 | Resp 16 | Wt 186.0 lb

## 2020-02-18 DIAGNOSIS — E785 Hyperlipidemia, unspecified: Secondary | ICD-10-CM

## 2020-02-18 DIAGNOSIS — I83893 Varicose veins of bilateral lower extremities with other complications: Secondary | ICD-10-CM

## 2020-02-18 NOTE — Progress Notes (Signed)
Skin , right lateral calf RESIDUAL HYPERTROPHIC ACTINIC KERATOSIS, LIMITED MARGINS FREE --> LN2 at f/u in November  MAs please call

## 2020-02-18 NOTE — Telephone Encounter (Signed)
-----   Message from Alfonso Patten, MD sent at 02/18/2020 11:48 AM EDT ----- Skin , right lateral calf RESIDUAL HYPERTROPHIC ACTINIC KERATOSIS, LIMITED MARGINS FREE --> LN2 at f/u in November  MAs please call

## 2020-02-19 ENCOUNTER — Encounter: Payer: Self-pay | Admitting: Dermatology

## 2020-02-23 ENCOUNTER — Encounter (INDEPENDENT_AMBULATORY_CARE_PROVIDER_SITE_OTHER): Payer: Self-pay | Admitting: Nurse Practitioner

## 2020-02-23 NOTE — Progress Notes (Signed)
Subjective:    Patient ID: Sabrina Wilson, female    DOB: 1940-05-18, 79 y.o.   MRN: [id number] Chief Complaint  Patient presents with  . Follow-up    6wk follow up    Today the patient is following up after latest sclerotherapy session approximately 4 weeks ago.  The patient has underwent sclerotherapy sessions following endovenous laser ablation.  Today the patient notes that her lower extremities feel much better.  She denies any inflammation or signs symptoms of infection.  She denies any open wounds or ulcerations.  She denies any fever, chills, nausea, vomiting or diarrhea.  Overall her legs are doing well.   Review of Systems  Cardiovascular: Negative for leg swelling.  Skin: Negative for wound.  All other systems reviewed and are negative.      Objective:   Physical Exam Vitals reviewed.  HENT:     Head: Normocephalic.  Cardiovascular:     Rate and Rhythm: Normal rate.     Pulses: Normal pulses.  Pulmonary:     Effort: Pulmonary effort is normal.  Skin:    General: Skin is warm and dry.  Neurological:     Mental Status: She is alert and oriented to person, place, and time.  Psychiatric:        Mood and Affect: Mood normal.        Behavior: Behavior normal.        Thought Content: Thought content normal.        Judgment: Judgment normal.     BP (!) 169/88 (BP Location: Right Arm)   Pulse 79   Resp 16   Wt 186 lb (84.4 kg)   BMI 30.02 kg/m   Past Medical History:  Diagnosis Date  . Actinic keratosis 02/12/2020   R lat calf (hypertrophic)  . Arthritis   . Colon cancer [facility]) 2009   T3,N0; s/p resection  Dr. Hulda Humphrey and chemotherapy by Dr. Cynda Acres  . Hernia of flank   . Hypertension   . Neuropathy   . Polyp at cervical os    s/p resection Dr. Sabra Heck  . Skin cancer 01/22/2020   surface of an atypical verrucous squamous proliferation     Social History   Socioeconomic History  . Marital status: Married    Spouse name: Not on file  . Number of  children: Not on file  . Years of education: Not on file  . Highest education level: Not on file  Occupational History  . Not on file  Tobacco Use  . Smoking status: Former Smoker    Quit date: 10/12/2007    Years since quitting: 12.3  . Smokeless tobacco: Never Used  Vaping Use  . Vaping Use: Never used  Substance and Sexual Activity  . Alcohol use: Yes    Alcohol/week: 1.0 standard drink    Types: 1 Standard drinks or equivalent per week    Comment: with dinner  . Drug use: No  . Sexual activity: Not on file  Other Topics Concern  . Not on file  Social History Narrative   Lives in [place] with husband. 2 children, son lives nearby.      Diet - regular      Exercise - none   Social Determinants of Health   Financial Resource Strain:   . Difficulty of Paying Living Expenses: Not on file  Food Insecurity:   . Worried About Charity fundraiser in the Last Year: Not on file  . Ran Out of  Food in the Last Year: Not on file  Transportation Needs:   . Lack of Transportation (Medical): Not on file  . Lack of Transportation (Non-Medical): Not on file  Physical Activity:   . Days of Exercise per Week: Not on file  . Minutes of Exercise per Session: Not on file  Stress:   . Feeling of Stress : Not on file  Social Connections:   . Frequency of Communication with Friends and Family: Not on file  . Frequency of Social Gatherings with Friends and Family: Not on file  . Attends Religious Services: Not on file  . Active Member of Clubs or Organizations: Not on file  . Attends Archivist Meetings: Not on file  . Marital Status: Not on file  Intimate Partner Violence:   . Fear of Current or Ex-Partner: Not on file  . Emotionally Abused: Not on file  . Physically Abused: Not on file  . Sexually Abused: Not on file    Past Surgical History:  Procedure Laterality Date  . BREAST BIOPSY Right 05/2014   Dr. Dwyane Luo office-benign  . BREAST SURGERY Right February 2016     Vacuum assisted biopsy for recurrent cyst, fibrocystic changes without evidence of malignancy.  Marland Kitchen CATARACT EXTRACTION W/PHACO Left 01/13/2015   Procedure: CATARACT EXTRACTION PHACO AND INTRAOCULAR LENS PLACEMENT ([facility]);  Surgeon: Birder Robson, MD;  Location: [facility] ORS;  Service: Ophthalmology;  Laterality: Left;  [place]  1:02.9 AP   19.2 CDE  12.06 casette lot #  [id number]  . CATARACT EXTRACTION W/PHACO Right 02/03/2015   Procedure: CATARACT EXTRACTION PHACO AND INTRAOCULAR LENS PLACEMENT ([place]);  Surgeon: Birder Robson, MD;  Location: [facility] ORS;  Service: Ophthalmology;  Laterality: Right;  [id number] ap46.5 [id number]  . COLECTOMY    . COLONOSCOPY  2015   Dr Candace Cruise  . COLONOSCOPY WITH PROPOFOL N/A 09/13/2017   Procedure: COLONOSCOPY WITH PROPOFOL;  Surgeon: Robert Bellow, MD;  Location: [facility] ENDOSCOPY;  Service: Endoscopy;  Laterality: N/A;  . COLONOSCOPY WITH PROPOFOL N/A 11/08/2017   Procedure: COLONOSCOPY WITH PROPOFOL;  Surgeon: Robert Bellow, MD;  Location: [facility] ENDOSCOPY;  Service: Endoscopy;  Laterality: N/A;  . colostomy reversal    . DILATION AND CURETTAGE OF UTERUS  2014   Dr. Sabra Heck, benign per pt  . EYE SURGERY    . HERNIA REPAIR  07/13/12   ventral   . TONSILLECTOMY      Family History  Problem Relation Age of Onset  . Stroke Mother   . Diabetes Mother   . Cancer Father        colon  . Cancer Brother        colon  . Cancer Brother        brain    Allergies  Allergen Reactions  . Sulfa Antibiotics Hives    As a child    CBC Latest Ref Rng & Units 06/19/2017 06/17/2016 04/28/2016  WBC 3.6 - 11.0 K/uL 6.6 7.1 6.4  Hemoglobin 12.0 - 16.0 g/dL 12.0 12.4 12.6  Hematocrit 35 - 47 % 35.9 36.3 37.9  Platelets 150 - 440 K/uL 280 282 286.0      CMP     Component Value Date/Time   NA 137 11/26/2019 1204   NA 133 (L) 07/15/2012 2105   K 4.0 11/26/2019 1204   K 3.8 07/18/2012 0503   CL 101 11/26/2019 1204   CL 100 07/15/2012 2105   CO2 30 11/26/2019 1204    CO2 28 07/15/2012 2105  GLUCOSE 105 (H) 11/26/2019 1204   GLUCOSE 136 (H) 07/15/2012 2105   BUN 17 11/26/2019 1204   BUN 10 07/15/2012 2105   CREATININE 0.80 11/26/2019 1204   CREATININE 0.68 07/15/2012 2105   CALCIUM 9.7 11/26/2019 1204   CALCIUM 8.4 (L) 07/15/2012 2105   PROT 7.0 11/26/2019 1204   PROT 7.5 06/22/2011 1323   ALBUMIN 4.4 11/26/2019 1204   ALBUMIN 3.8 06/22/2011 1323   AST 19 11/26/2019 1204   AST 14 (L) 06/22/2011 1323   ALT 16 11/26/2019 1204   ALT 24 06/22/2011 1323   ALKPHOS 66 11/26/2019 1204   ALKPHOS 72 06/22/2011 1323   BILITOT 0.7 11/26/2019 1204   BILITOT 0.4 06/22/2011 1323   GFRNONAA 52 (L) 06/19/2017 1429   GFRNONAA >60 07/15/2012 2105   GFRAA 60 (L) 06/19/2017 1429   GFRAA >60 07/15/2012 2105         Assessment & Plan:   1. Varicose veins of bilateral lower extremities with other complications Patient is doing well post treatment of her varicosities with endovenous laser ablation as well at sclerotherapy.  No wounds or ulcerations presents no signs and symptoms of inflammation or infection.  The patient will continue with other conservative tactics.  Patient will follow up on an as-needed basis.  2. Hyperlipidemia, unspecified hyperlipidemia type Continue statin as ordered and reviewed, no changes at this time    Current Outpatient Medications on File Prior to Visit  Medication Sig Dispense Refill  . amLODipine (NORVASC) 5 MG tablet TAKE ONE TABLET BY MOUTH DAILY 90 tablet 3  . atorvastatin (LIPITOR) 10 MG tablet Take 1 tablet (10 mg total) by mouth daily. 90 tablet 1  . Calcium Carbonate-Vit D-Min (CALCIUM 1200 PO) Take by mouth daily.    Marland Kitchen losartan (COZAAR) 50 MG tablet TAKE TWO TABLETS BY MOUTH DAILY 180 tablet 0  . mupirocin ointment (BACTROBAN) 2 % Apply 1 application topically 2 (two) times daily. 22 g 0  . Omega-3 Fatty Acids (FISH OIL PO) Take by mouth.    . triamcinolone cream (KENALOG) 0.1 % Apply 1 application topically 2 (two)  times daily. 30 g 0  . VITAMIN D, CHOLECALCIFEROL, PO Take 2,000 Units by mouth daily.     No current facility-administered medications on file prior to visit.    There are no Patient Instructions on file for this visit. No follow-ups on file.   Kris Hartmann, NP

## 2020-02-26 ENCOUNTER — Encounter: Payer: Self-pay | Admitting: Family Medicine

## 2020-02-26 ENCOUNTER — Ambulatory Visit: Payer: Medicare Other | Admitting: Family Medicine

## 2020-02-26 ENCOUNTER — Other Ambulatory Visit: Payer: Self-pay

## 2020-02-26 DIAGNOSIS — R7303 Prediabetes: Secondary | ICD-10-CM | POA: Diagnosis not present

## 2020-02-26 DIAGNOSIS — E785 Hyperlipidemia, unspecified: Secondary | ICD-10-CM

## 2020-02-26 DIAGNOSIS — Z85038 Personal history of other malignant neoplasm of large intestine: Secondary | ICD-10-CM

## 2020-02-26 DIAGNOSIS — I10 Essential (primary) hypertension: Secondary | ICD-10-CM

## 2020-02-26 NOTE — Assessment & Plan Note (Signed)
Encouraged increasing activity.  Discussed healthy diet.  Dietary guidelines given.

## 2020-02-26 NOTE — Progress Notes (Signed)
  Tommi Rumps, MD Phone: [phone]  Sabrina Wilson is a 79 y.o. female who presents today for f/u.  HYPERTENSION  Disease Monitoring  Home BP Monitoring 130/70 Chest pain- no    Dyspnea- no Medications  Compliance-  Taking losartan, amlodipine.  Edema- minimal though improved over the past several weeks.  Patient does report some mild constipation with the amlodipine.  She is not taking a fiber supplement.  HYPERLIPIDEMIA Symptoms Chest pain on exertion:  no   Medications: Compliance- taking lipitor Right upper quadrant pain- no  Muscle aches- no  History of colon cancer: Diagnosed and treated in 2009.  Follows with general surgery periodically.  Colonoscopy in 2019 with 5-year recall.  Prediabetes: Patient notes she stays active though no specific exercise.  Diet is generally relatively healthy.  Occasional snack food.  No soda or sweet tea.  Actinic keratosis: Patient had dermatology remove this from her right lateral calf.  She follows up with them tomorrow for stitch removal.  Social History   Tobacco Use  Smoking Status Former Smoker  . Quit date: 10/12/2007  . Years since quitting: 12.3  Smokeless Tobacco Never Used     ROS see history of present illness  Objective  Physical Exam Vitals:   02/26/20 1108  BP: 130/70  Pulse: 77  Temp: 98.4 F (36.9 C)    BP Readings from Last 3 Encounters:  02/26/20 130/70  02/18/20 (!) 169/88  01/07/20 (!) 151/82   Wt Readings from Last 3 Encounters:  02/26/20 186 lb 9.6 oz (84.6 kg)  02/18/20 186 lb (84.4 kg)  01/07/20 187 lb (84.8 kg)    Physical Exam Constitutional:      General: She is not in acute distress.    Appearance: She is not diaphoretic.  Cardiovascular:     Rate and Rhythm: Normal rate and regular rhythm.     Heart sounds: Normal heart sounds.  Pulmonary:     Effort: Pulmonary effort is normal.     Breath sounds: Normal breath sounds.  Musculoskeletal:       Feet:  Skin:    General:  Skin is warm and dry.  Neurological:     Mental Status: She is alert.      Assessment/Plan: Please see individual problem list.  Problem List Items Addressed This Visit    Hyperlipidemia    Well-controlled.  Continue Lipitor 10 mg once daily.      Hypertension    Adequate control for age.  Continue losartan 50 mg once daily and amlodipine 5 mg once daily.  Discussed adding in Metamucil to help with her mild constipation.  She will also monitor the swelling.  If it worsens we could consider changing the amlodipine to an alternative blood pressure medicine.      Personal history of colon cancer    Colonoscopy up-to-date.  Advised to 5-year recall.      Prediabetes    Encouraged increasing activity.  Discussed healthy diet.  Dietary guidelines given.          This visit occurred during the SARS-CoV-2 public health emergency.  Safety protocols were in place, including screening questions prior to the visit, additional usage of staff PPE, and extensive cleaning of exam room while observing appropriate contact time as indicated for disinfecting solutions.    Tommi Rumps, MD [place]

## 2020-02-26 NOTE — Assessment & Plan Note (Signed)
Colonoscopy up-to-date.  Advised to 5-year recall.

## 2020-02-26 NOTE — Assessment & Plan Note (Signed)
Well-controlled.  Continue Lipitor 10 mg once daily.

## 2020-02-26 NOTE — Patient Instructions (Signed)
Nice to see you. Please try to increase your activity.  Diet Recommendations  Starchy (carb) foods: Bread, rice, pasta, potatoes, corn, cereal, grits, crackers, bagels, muffins, all baked goods.  (Fruits, milk, and yogurt also have carbohydrate, but most of these foods will not spike your blood sugar as the starchy foods will.)  A few fruits do cause high blood sugars; use small portions of bananas (limit to 1/2 at a time), grapes, watermelon, oranges, and most tropical fruits.    Protein foods: Meat, fish, poultry, eggs, dairy foods, and beans such as pinto and kidney beans (beans also provide carbohydrate).   1. Eat at least 3 meals and 1-2 snacks per day. Never go more than 4-5 hours while awake without eating. Eat breakfast within the first hour of getting up.   2. Limit starchy foods to TWO per meal and ONE per snack. ONE portion of a starchy  food is equal to the following:   - ONE slice of bread (or its equivalent, such as half of a hamburger bun).   - 1/2 cup of a "scoopable" starchy food such as potatoes or rice.   - 15 grams of carbohydrate as shown on food label.  3. Include at every meal: a protein food, a carb food, and vegetables and/or fruit.   - Obtain twice the volume of veg's as protein or carbohydrate foods for both lunch and dinner.   - Fresh or frozen veg's are best.   - Keep frozen veg's on hand for a quick vegetable serving.

## 2020-02-26 NOTE — Assessment & Plan Note (Signed)
Adequate control for age.  Continue losartan 50 mg once daily and amlodipine 5 mg once daily.  Discussed adding in Metamucil to help with her mild constipation.  She will also monitor the swelling.  If it worsens we could consider changing the amlodipine to an alternative blood pressure medicine.

## 2020-02-27 ENCOUNTER — Ambulatory Visit: Payer: Medicare Other | Admitting: Dermatology

## 2020-02-27 DIAGNOSIS — L570 Actinic keratosis: Secondary | ICD-10-CM

## 2020-02-27 NOTE — Patient Instructions (Signed)
Cryotherapy Aftercare  . Wash gently with soap and water everyday.   . Apply Vaseline and Band-Aid daily until healed.  Prior to procedure, discussed risks of blister formation, small wound, skin dyspigmentation, or rare scar following cryotherapy.   

## 2020-02-27 NOTE — Progress Notes (Signed)
   Follow-Up Visit   Subjective  Sabrina Wilson is a 79 y.o. female who presents for the following: Follow-up.  Patient here today for suture removal at right lateral calf showing biopsy proven AK.  Patient advises there is a spot at back at left thigh that is sore.   The following portions of the chart were reviewed this encounter and updated as appropriate:  Tobacco  Allergies  Meds  Problems  Med Hx  Surg Hx  Fam Hx      Review of Systems:  No other skin or systemic complaints except as noted in HPI or Assessment and Plan.  Objective  Well appearing patient in no apparent distress; mood and affect are within normal limits.  A focused examination was performed including legs. Relevant physical exam findings are noted in the Assessment and Plan.  Objective  L posterior thigh x 1, R lateral calf x 1, L lateral ankle x 2 (4): Erythematous thin papules/macules with gritty scale, hypertrophic.  Biopsy proven at right lateral calf.    Assessment & Plan  AK (actinic keratosis) (4) L posterior thigh x 1, R lateral calf x 1, L lateral ankle x 2  Prior to procedure, discussed risks of blister formation, small wound, skin dyspigmentation, or rare scar following cryotherapy.    Destruction of lesion - L posterior thigh x 1, R lateral calf x 1, L lateral ankle x 2 Complexity: simple   Destruction method: cryotherapy   Informed consent: discussed and consent obtained   Lesion destroyed using liquid nitrogen: Yes   Cryotherapy cycles:  2 Outcome: patient tolerated procedure well with no complications   Post-procedure details: wound care instructions given    Encounter for Removal of Sutures - Incision site at the right lateral calf is clean, dry and intact - Wound cleansed, sutures removed, wound cleansed and steri strips applied.  - Discussed pathology results showing Hypertrophic AK  - Scars remodel for a full year. - Patient advised to call with any concerns or if they  notice any new or changing lesions.   Return 1-3 months, for TBSE.  Graciella Belton, RMA, am acting as scribe for Forest Gleason, MD .  Documentation: I have reviewed the above documentation for accuracy and completeness, and I agree with the above.  Forest Gleason, MD

## 2020-03-11 ENCOUNTER — Ambulatory Visit: Payer: Medicare Other | Admitting: Dermatology

## 2020-03-16 ENCOUNTER — Encounter: Payer: Self-pay | Admitting: Dermatology

## 2020-04-23 ENCOUNTER — Other Ambulatory Visit: Payer: Self-pay | Admitting: Family Medicine

## 2020-04-23 DIAGNOSIS — E785 Hyperlipidemia, unspecified: Secondary | ICD-10-CM

## 2020-06-03 ENCOUNTER — Other Ambulatory Visit: Payer: Self-pay | Admitting: Dermatology

## 2020-06-03 ENCOUNTER — Other Ambulatory Visit: Payer: Self-pay

## 2020-06-03 ENCOUNTER — Ambulatory Visit: Payer: Medicare Other | Admitting: Dermatology

## 2020-06-03 DIAGNOSIS — L570 Actinic keratosis: Secondary | ICD-10-CM

## 2020-06-03 DIAGNOSIS — L820 Inflamed seborrheic keratosis: Secondary | ICD-10-CM

## 2020-06-03 DIAGNOSIS — L814 Other melanin hyperpigmentation: Secondary | ICD-10-CM

## 2020-06-03 DIAGNOSIS — Z1283 Encounter for screening for malignant neoplasm of skin: Secondary | ICD-10-CM

## 2020-06-03 DIAGNOSIS — R238 Other skin changes: Secondary | ICD-10-CM | POA: Diagnosis not present

## 2020-06-03 DIAGNOSIS — L821 Other seborrheic keratosis: Secondary | ICD-10-CM

## 2020-06-03 DIAGNOSIS — D489 Neoplasm of uncertain behavior, unspecified: Secondary | ICD-10-CM

## 2020-06-03 DIAGNOSIS — D2262 Melanocytic nevi of left upper limb, including shoulder: Secondary | ICD-10-CM | POA: Diagnosis not present

## 2020-06-03 DIAGNOSIS — L578 Other skin changes due to chronic exposure to nonionizing radiation: Secondary | ICD-10-CM | POA: Diagnosis not present

## 2020-06-03 DIAGNOSIS — D229 Melanocytic nevi, unspecified: Secondary | ICD-10-CM

## 2020-06-03 DIAGNOSIS — D1800 Hemangioma unspecified site: Secondary | ICD-10-CM

## 2020-06-03 NOTE — Progress Notes (Signed)
Follow-Up Visit   Subjective  Sabrina Wilson is a 80 y.o. female who presents for the following: tbse (Patient is here today for tbse. She complains of lesion on the left foot that is itchy, She also reports some areas on back and left eye brow that she is concerned about. ).  Patient here for full body skin exam and skin cancer screening. History of atypical verrucous squamous proliferation repeat biopsy showed ak on right lateral calf.     The following portions of the chart were reviewed this encounter and updated as appropriate:  Tobacco  Allergies  Meds  Problems  Med Hx  Surg Hx  Fam Hx        Objective  Well appearing patient in no apparent distress; mood and affect are within normal limits.  A full examination was performed including scalp, head, eyes, ears, nose, lips, neck, chest, axillae, abdomen, back, buttocks, bilateral upper extremities, bilateral lower extremities, hands, feet, fingers, toes, fingernails, and toenails. All findings within normal limits unless otherwise noted below.  Objective  left lateral ankle x 2 left lateral breast x 1 (3): Erythematous thin papules/macules with gritty scale.   Objective  left thigh x1, left brow x 2, right anterior helix x 1, left upper back x 2 right mid back x 2, left flank x1, (9): Itchy Erythematous keratotic or waxy stuck-on papule or plaque.   Objective  Left Dorsal Hand: 0.25 cm dark brown macule      Assessment & Plan  Actinic keratosis (3) left lateral ankle x 2 left lateral breast x 1  Hypertrophic   Prior to procedure, discussed risks of blister formation, small wound, skin dyspigmentation, or rare scar following cryotherapy.   Plan to recheck spot on left lateral breast  Destruction of lesion - left lateral ankle x 2 left lateral breast x 1  Destruction method: cryotherapy   Informed consent: discussed and consent obtained   Lesion destroyed using liquid nitrogen: Yes   Cryotherapy cycles:   2 Outcome: patient tolerated procedure well with no complications   Post-procedure details: wound care instructions given    Inflamed seborrheic keratosis (9) left thigh x1, left brow x 2, right anterior helix x 1, left upper back x 2 right mid back x 2, left flank x1,  Prior to procedure, discussed risks of blister formation, small wound, skin dyspigmentation, or rare scar following cryotherapy.    Destruction of lesion - left thigh x1, left brow x 2, right anterior helix x 1, left upper back x 2 right mid back x 2, left flank x1,  Destruction method: cryotherapy   Informed consent: discussed and consent obtained   Lesion destroyed using liquid nitrogen: Yes   Cryotherapy cycles:  2 Outcome: patient tolerated procedure well with no complications   Post-procedure details: wound care instructions given    Neoplasm of uncertain behavior Left Dorsal Hand  Epidermal / dermal shaving  Lesion diameter (cm):  0.3 Informed consent: discussed and consent obtained   Timeout: patient name, date of birth, surgical site, and procedure verified   Patient was prepped and draped in usual sterile fashion: area prepped with isopropyl alcohol. Anesthesia: the lesion was anesthetized in a standard fashion   Anesthetic:  1% lidocaine w/ epinephrine 1-100,000 buffered w/ 8.4% NaHCO3 Instrument used: flexible razor blade   Hemostasis achieved with: aluminum chloride   Outcome: patient tolerated procedure well   Post-procedure details: wound care instructions given   Additional details:  Mupirocin and a bandage  applied  Specimen 1 - Surgical pathology Differential Diagnosis: r/o atypia   Check Margins: No 0.25 cm dark brown macule  R/o atypia   Other Related Medications mupirocin ointment (BACTROBAN) 2 %  Inflammatory papule (2) Left Breast; Right Breast  Recheck at follow up  Benign-appearing today. Call clinic for new or changing lesions.  Recommend daily use of broad spectrum spf 30+  sunscreen to sun-exposed areas.    Lentigines - Scattered tan macules - Discussed due to sun exposure - Benign, observe - Call for any changes  Seborrheic Keratoses Face  - Stuck-on, waxy, tan-brown papules and plaques  - Discussed benign etiology and prognosis. - Observe - Call for any changes  Melanocytic Nevi - Tan-brown and/or pink-flesh-colored symmetric macules and papules - Benign appearing on exam today - Observation - Call clinic for new or changing moles - Recommend daily use of broad spectrum spf 30+ sunscreen to sun-exposed areas.   Hemangiomas - Red papules - Discussed benign nature - Observe - Call for any changes  Actinic Damage - Chronic, secondary to cumulative UV/sun exposure - diffuse scaly erythematous macules with underlying dyspigmentation - Recommend daily broad spectrum sunscreen SPF 30+ to sun-exposed areas, reapply every 2 hours as needed.  - Call for new or changing lesions.  Skin cancer screening performed today.  Return in about 6 weeks (around 07/15/2020) for recheck at ankle, 2 isk on chest, isks .  I, Ruthell Rummage, CMA, am acting as scribe for Forest Gleason, MD.   Documentation: I have reviewed the above documentation for accuracy and completeness, and I agree with the above.  Forest Gleason, MD

## 2020-06-03 NOTE — Patient Instructions (Addendum)
Cryotherapy Aftercare  . Wash gently with soap and water everyday.   Marland Kitchen Apply Vaseline and Band-Aid daily until healed.  Biopsy Wound Care Instructions  1. Leave the original bandage on for 24 hours if possible.  If the bandage becomes soaked or soiled before that time, it is OK to remove it and examine the wound.  A small amount of post-operative bleeding is normal.  If excessive bleeding occurs, remove the bandage, place gauze over the site and apply continuous pressure (no peeking) over the area for 30 minutes. If this does not work, please call our clinic as soon as possible or page your doctor if it is after hours.   2. Once a day, cleanse the wound with soap and water. It is fine to shower. If a thick crust develops you may use a Q-tip dipped into dilute hydrogen peroxide (mix 1:1 with water) to dissolve it.  Hydrogen peroxide can slow the healing process, so use it only as needed.    3. After washing, apply petroleum jelly (Vaseline) or an antibiotic ointment if your doctor prescribed one for you, followed by a bandage.    4. For best healing, the wound should be covered with a layer of ointment at all times. If you are not able to keep the area covered with a bandage to hold the ointment in place, this may mean re-applying the ointment several times a day.  Continue this wound care until the wound has healed and is no longer open.   Itching and mild discomfort is normal during the healing process. However, if you develop pain or severe itching, please call our office.   If you have any discomfort, you can take Tylenol (acetaminophen) or ibuprofen as directed on the bottle. (Please do not take these if you have an allergy to them or cannot take them for another reason).  Some redness, tenderness and white or yellow material in the wound is normal healing.  If the area becomes very sore and red, or develops a thick yellow-green material (pus), it may be infected; please notify us.    If you  have stitches, return to clinic as directed to have the stitches removed. You will continue wound care for 2-3 days after the stitches are removed.   Wound healing continues for up to one year following surgery. It is not unusual to experience pain in the scar from time to time during the interval.  If the pain becomes severe or the scar thickens, you should notify the office.    A slight amount of redness in a scar is expected for the first six months.  After six months, the redness will fade and the scar will soften and fade.  The color difference becomes less noticeable with time.  If there are any problems, return for a post-op surgery check at your earliest convenience.  To improve the appearance of the scar, you can use silicone scar gel, cream, or sheets (such as Mederma or Serica) every night for up to one year. These are available over the counter (without a prescription).  Please call our office at [phone] for any questions or concerns.

## 2020-06-08 ENCOUNTER — Encounter: Payer: Self-pay | Admitting: Dermatology

## 2020-07-16 ENCOUNTER — Ambulatory Visit: Payer: Medicare Other | Admitting: Dermatology

## 2020-07-16 ENCOUNTER — Other Ambulatory Visit: Payer: Self-pay

## 2020-07-16 DIAGNOSIS — C44729 Squamous cell carcinoma of skin of left lower limb, including hip: Secondary | ICD-10-CM

## 2020-07-16 DIAGNOSIS — Z872 Personal history of diseases of the skin and subcutaneous tissue: Secondary | ICD-10-CM | POA: Diagnosis not present

## 2020-07-16 DIAGNOSIS — D485 Neoplasm of uncertain behavior of skin: Secondary | ICD-10-CM

## 2020-07-16 DIAGNOSIS — L820 Inflamed seborrheic keratosis: Secondary | ICD-10-CM | POA: Diagnosis not present

## 2020-07-16 DIAGNOSIS — D492 Neoplasm of unspecified behavior of bone, soft tissue, and skin: Secondary | ICD-10-CM

## 2020-07-16 DIAGNOSIS — C4492 Squamous cell carcinoma of skin, unspecified: Secondary | ICD-10-CM

## 2020-07-16 DIAGNOSIS — L570 Actinic keratosis: Secondary | ICD-10-CM

## 2020-07-16 HISTORY — DX: Squamous cell carcinoma of skin, unspecified: C44.92

## 2020-07-16 NOTE — Progress Notes (Signed)
Follow-Up Visit   Subjective  Sabrina Wilson is a 80 y.o. female who presents for the following: Actinic Keratosis (L lat ankle x 2, L lat breast x 1, 6wk f/u to recheck) and ISK (Back, has gotten larger since LN2 6 wks ago).  L lateral ankle tender  The following portions of the chart were reviewed this encounter and updated as appropriate:   Tobacco  Allergies  Meds  Problems  Med Hx  Surg Hx  Fam Hx      Review of Systems:  No other skin or systemic complaints except as noted in HPI or Assessment and Plan.  Objective  Well appearing patient in no apparent distress; mood and affect are within normal limits.  A focused examination was performed including L lat ankle, L lat breast, back. Relevant physical exam findings are noted in the Assessment and Plan.  Objective  Left Flank: 1.4cm tan scaly verrucous plaque     Objective  Left lat Ankle - Anterior: 0.9cm scaly pink pap     Objective  Right lower eyelid x 1: Erythematous keratotic or waxy stuck-on papule or plaque.   Objective  L lat ankle x 1: Pink scaly papule    Assessment & Plan    History of PreCancerous Actinic Keratosis  - sites of PreCancerous Actinic Keratosis clear today. - these may recur and new lesions may form requiring treatment to prevent transformation into skin cancer - observe for new or changing spots and contact [facility] for appointment if occur - photoprotection with sun protective clothing; sunglasses and broad spectrum sunscreen with SPF of at least 30 + and frequent self skin exams recommended - yearly exams by a dermatologist recommended for persons with history of PreCancerous Actinic Keratoses - L lat breast clear  Neoplasm of skin (2) Left Flank  Epidermal / dermal shaving  Lesion diameter (cm):  1.4 Informed consent: discussed and consent obtained   Timeout: patient name, date of birth, surgical site, and procedure verified   Procedure prep:   Patient was prepped and draped in usual sterile fashion Prep type:  Isopropyl alcohol Anesthesia: the lesion was anesthetized in a standard fashion   Anesthetic:  1% lidocaine w/ epinephrine 1-100,000 buffered w/ 8.4% NaHCO3 Instrument used: flexible razor blade   Hemostasis achieved with: pressure and aluminum chloride   Outcome: patient tolerated procedure well   Post-procedure details: sterile dressing applied and wound care instructions given   Dressing type: bandage and bacitracin   Additional details:  With curettage and light cryotherapy to base  Specimen 1 - Surgical pathology Differential Diagnosis: D48.5 R/O ISK vs Verruca within ISK Check Margins: No 1.4cm tan scaly verrucous plaque  Left lat Ankle - Anterior  Skin / nail biopsy Type of biopsy: tangential   Informed consent: discussed and consent obtained   Anesthesia: the lesion was anesthetized in a standard fashion   Anesthesia comment:  Area prepped with alcohol Anesthetic:  1% lidocaine w/ epinephrine 1-100,000 buffered w/ 8.4% NaHCO3 Instrument used: flexible razor blade   Hemostasis achieved with: pressure, aluminum chloride and electrodesiccation   Outcome: patient tolerated procedure well   Post-procedure details: wound care instructions given   Post-procedure details comment:  Ointment and small bandage applied  Specimen 2 - Surgical pathology Differential Diagnosis: D48.5 R/O SCC vs Verruca vs other Check Margins: No 0.9cm scaly pink pap  R/O ISK vs Verruca within ISK L flank, shave removal, EDC and LN2  R/O SCC vs Verruca vs other L  lat ankle, bx today   Inflamed seborrheic keratosis Right lower eyelid x 1  Prior to procedure, discussed risks of blister formation, small wound, skin dyspigmentation, or rare scar following cryotherapy.    Destruction of lesion - Right lower eyelid x 1 Complexity: simple   Destruction method: cryotherapy   Informed consent: discussed and consent obtained    Timeout:  patient name, date of birth, surgical site, and procedure verified Lesion destroyed using liquid nitrogen: Yes   Region frozen until ice ball extended beyond lesion: Yes   Outcome: patient tolerated procedure well with no complications   Post-procedure details: wound care instructions given    AK (actinic keratosis) L lat ankle x 1  Consider biopsy if not resolved follow-up  Prior to procedure, discussed risks of blister formation, small wound, skin dyspigmentation, or rare scar following cryotherapy.    Destruction of lesion - L lat ankle x 1 Complexity: simple   Destruction method: cryotherapy   Informed consent: discussed and consent obtained   Timeout:  patient name, date of birth, surgical site, and procedure verified Lesion destroyed using liquid nitrogen: Yes   Region frozen until ice ball extended beyond lesion: Yes   Outcome: patient tolerated procedure well with no complications   Post-procedure details: wound care instructions given    Return in about 2 months (around 09/15/2020) for recheck bx sites, AK, ISK.   I, Sonya Hupman, RMA, am acting as scribe for Forest Gleason, MD .  Documentation: I have reviewed the above documentation for accuracy and completeness, and I agree with the above.  Forest Gleason, MD

## 2020-07-16 NOTE — Patient Instructions (Addendum)
If you have any questions or concerns for your doctor, please call our main line at [phone] and press option 4 to reach your doctor's medical assistant. If no one answers, please leave a voicemail as directed and we will return your call as soon as possible. Messages left after 4 pm will be answered the following business day.   You may also send us a message via MyChart. We typically respond to MyChart messages within 1-2 business days.  For prescription refills, please ask your pharmacy to contact our office. Our fax number is [phone].  If you have an urgent issue when the clinic is closed that cannot wait until the next business day, you can page your doctor at the number below.    Please note that while we do our best to be available for urgent issues outside of office hours, we are not available 24/7.   If you have an urgent issue and are unable to reach us, you may choose to seek medical care at your doctor's office, retail clinic, urgent care center, or emergency room.  If you have a medical emergency, please immediately call 911 or go to the emergency department.  Pager Numbers  - Dr. Kowalski: [phone]  - Dr. Moye: [phone]  - Dr. Stewart: [phone]  In the event of inclement weather, please call our main line at [phone] for an update on the status of any delays or closures.  Dermatology Medication Tips: Please keep the boxes that topical medications come in in order to help keep track of the instructions about where and how to use these. Pharmacies typically print the medication instructions only on the boxes and not directly on the medication tubes.   If your medication is too expensive, please contact our office at [phone] option 4 or send us a message through MyChart.   We are unable to tell what your co-pay for medications will be in advance as this is different depending on your insurance coverage. However, we may be able to find a  substitute medication at lower cost or fill out paperwork to get insurance to cover a needed medication.   If a prior authorization is required to get your medication covered by your insurance company, please allow us 1-2 business days to complete this process.  Drug prices often vary depending on where the prescription is filled and some pharmacies may offer cheaper prices.  The website www.goodrx.com contains coupons for medications through different pharmacies. The prices here do not account for what the cost may be with help from insurance (it may be cheaper with your insurance), but the website can give you the price if you did not use any insurance.  - You can print the associated coupon and take it with your prescription to the pharmacy.  - You may also stop by our office during regular business hours and pick up a GoodRx coupon card.  - If you need your prescription sent electronically to a different pharmacy, notify our office through [facility] MyChart or by phone at [phone] option 4.     Wound Care Instructions  1. Cleanse wound gently with soap and water once a day then pat dry with clean gauze. Apply a thing coat of Petrolatum (petroleum jelly, "Vaseline") over the wound (unless you have an allergy to this). We recommend that you use a new, sterile tube of Vaseline. Do not pick or remove scabs. Do not remove the yellow or white "healing tissue" from the base of the wound.    2. Cover the wound with fresh, clean, nonstick gauze and secure with paper tape. You may use Band-Aids in place of gauze and tape if the would is small enough, but would recommend trimming much of the tape off as there is often too much. Sometimes Band-Aids can irritate the skin.  3. You should call the office for your biopsy report after 1 week if you have not already been contacted.  4. If you experience any problems, such as abnormal amounts of bleeding, swelling, significant bruising, significant pain,  or evidence of infection, please call the office immediately.  5. FOR ADULT SURGERY PATIENTS: If you need something for pain relief you may take 1 extra strength Tylenol (acetaminophen) AND 2 Ibuprofen (200mg each) together every 4 hours as needed for pain. (do not take these if you are allergic to them or if you have a reason you should not take them.) Typically, you may only need pain medication for 1 to 3 days.     

## 2020-07-17 ENCOUNTER — Encounter: Payer: Self-pay | Admitting: Family Medicine

## 2020-07-19 ENCOUNTER — Encounter: Payer: Self-pay | Admitting: Dermatology

## 2020-07-20 MED ORDER — HYDROCHLOROTHIAZIDE 12.5 MG PO TABS: 12.5000 mg | ORAL_TABLET | Freq: Every day | ORAL | 1 refills | Status: DC

## 2020-07-20 NOTE — Addendum Note (Signed)
Addended by: Leone Haven on: 07/20/2020 09:52 AM   Modules accepted: Orders

## 2020-07-21 ENCOUNTER — Telehealth: Payer: Self-pay

## 2020-07-21 NOTE — Telephone Encounter (Signed)
Patient advised of biopsy results and scheduled for [facility].

## 2020-07-21 NOTE — Telephone Encounter (Signed)
-----   Message from Alfonso Patten, MD sent at 07/21/2020  1:24 PM EDT ----- 1. Skin , left flank, shave SEBORRHEIC KERATOSIS, IRRITATED  This is a benign growth or "wisdom spot". No additional treatment is needed as long as it is no longer bothersome.  2. Skin , left lat ankle-anterior, shave WELL DIFFERENTIATED SQUAMOUS CELL CARCINOMA, PERIPHERAL AND DEEP MARGINS INVOLVED --> ED&C within the next 6 weeks  Dr. Laurence Ferrari left voicemail 07/21/2020 at 1:20 PM.  MAs please call starting 07/22/2020. Thank you!

## 2020-07-23 ENCOUNTER — Other Ambulatory Visit: Payer: Self-pay | Admitting: Family Medicine

## 2020-07-23 DIAGNOSIS — Z1231 Encounter for screening mammogram for malignant neoplasm of breast: Secondary | ICD-10-CM

## 2020-08-11 ENCOUNTER — Encounter: Payer: Self-pay | Admitting: Dermatology

## 2020-08-11 ENCOUNTER — Ambulatory Visit: Payer: Medicare Other | Admitting: Dermatology

## 2020-08-11 ENCOUNTER — Other Ambulatory Visit: Payer: Self-pay

## 2020-08-11 DIAGNOSIS — D492 Neoplasm of unspecified behavior of bone, soft tissue, and skin: Secondary | ICD-10-CM

## 2020-08-11 DIAGNOSIS — Z872 Personal history of diseases of the skin and subcutaneous tissue: Secondary | ICD-10-CM

## 2020-08-11 DIAGNOSIS — C4492 Squamous cell carcinoma of skin, unspecified: Secondary | ICD-10-CM

## 2020-08-11 DIAGNOSIS — L570 Actinic keratosis: Secondary | ICD-10-CM | POA: Diagnosis not present

## 2020-08-11 DIAGNOSIS — L821 Other seborrheic keratosis: Secondary | ICD-10-CM | POA: Diagnosis not present

## 2020-08-11 DIAGNOSIS — L814 Other melanin hyperpigmentation: Secondary | ICD-10-CM

## 2020-08-11 DIAGNOSIS — L308 Other specified dermatitis: Secondary | ICD-10-CM | POA: Diagnosis not present

## 2020-08-11 DIAGNOSIS — C44729 Squamous cell carcinoma of skin of left lower limb, including hip: Secondary | ICD-10-CM | POA: Diagnosis not present

## 2020-08-11 HISTORY — DX: Personal history of diseases of the skin and subcutaneous tissue: Z87.2

## 2020-08-11 NOTE — Patient Instructions (Addendum)
Wound Care Instructions  1. Cleanse wound gently with soap and water once a day then pat dry with clean gauze. Apply a thing coat of Petrolatum (petroleum jelly, "Vaseline") or Mupirocin ointment over the wound (unless you have an allergy to this). We recommend that you use a new, sterile tube of Vaseline. Do not pick or remove scabs. Do not remove the yellow or white "healing tissue" from the base of the wound.  2. Cover the wound with fresh, clean, nonstick gauze and secure with paper tape. You may use Band-Aids in place of gauze and tape if the would is small enough, but would recommend trimming much of the tape off as there is often too much. Sometimes Band-Aids can irritate the skin.  3. You should call the office for your biopsy report after 1 week if you have not already been contacted.  4. If you experience any problems, such as abnormal amounts of bleeding, swelling, significant bruising, significant pain, or evidence of infection, please call the office immediately.  5. FOR ADULT SURGERY PATIENTS: If you need something for pain relief you may take 1 extra strength Tylenol (acetaminophen) AND 2 Ibuprofen (200mg  each) together every 4 hours as needed for pain. (do not take these if you are allergic to them or if you have a reason you should not take them.) Typically, you may only need pain medication for 1 to 3 days.

## 2020-08-11 NOTE — Progress Notes (Signed)
Follow-Up Visit   Subjective  Sabrina Wilson is a 80 y.o. female who presents for the following: Follow-up (left lat ankle-anterior,/WELL DIFFERENTIATED SQUAMOUS CELL CARCINOMA, PERIPHERAL AND DEEP MARGINS INVOLVED --> pt here for treatment ). Pt also has spots on the nose and lower legs she wants checked today. Last FBSE in Feb 2022.  The following portions of the chart were reviewed this encounter and updated as appropriate:   Tobacco  Allergies  Meds  Problems  Med Hx  Surg Hx  Fam Hx      Review of Systems:  No other skin or systemic complaints except as noted in HPI or Assessment and Plan.  Objective  Well appearing patient in no apparent distress; mood and affect are within normal limits.  A focused examination was performed including face,right leg,left leg. Relevant physical exam findings are noted in the Assessment and Plan.  Objective  Left lateral ankle: Keratotic pink papule/nodule or plaque.   Objective  Left Lateral ankle proximal: 0.9 cm Pink papule        Objective  Right pretibial x1, left pretibial x 1, right distal calf x1 (3): Erythematous thin papules/macules with gritty scale.   Objective  right nasal side wall: Scattered tan macules and telangectasia no  suspicious features on dermoscopy   Objective  Left Flank: Scab    Assessment & Plan  Squamous cell carcinoma of skin Left lateral ankle  Destruction of lesion  Destruction method: electrodesiccation and curettage   Informed consent: discussed and consent obtained   Timeout:  patient name, date of birth, surgical site, and procedure verified Curettage performed in three different directions: Yes   Electrodesiccation performed over the curetted area: Yes   Final wound size (cm):  1.5 Hemostasis achieved with:  pressure, aluminum chloride and electrodesiccation Outcome: patient tolerated procedure well with no complications   Post-procedure details: wound care instructions  given    Neoplasm of skin Left Lateral ankle proximal  Skin / nail biopsy Type of biopsy: tangential   Informed consent: discussed and consent obtained   Patient was prepped and draped in usual sterile fashion: area prepped with alochol. Anesthesia: the lesion was anesthetized in a standard fashion   Anesthetic:  1% lidocaine w/ epinephrine 1-100,000 buffered w/ 8.4% NaHCO3 Instrument used: flexible razor blade   Hemostasis achieved with: pressure, aluminum chloride and electrodesiccation   Outcome: patient tolerated procedure well   Post-procedure details: wound care instructions given   Post-procedure details comment:  Ointment and small bandage  Specimen 1 - Surgical pathology Differential Diagnosis: R/O SCCis   Check Margins: No Pink plaque  R/O SCCis   AK (actinic keratosis) (3) Right pretibial x1, left pretibial x 1, right distal calf x1  Prior to procedure, discussed risks of blister formation, small wound, skin dyspigmentation, or rare scar following cryotherapy.    Destruction of lesion - Right pretibial x1, left pretibial x 1, right distal calf x1 Complexity: simple   Destruction method: cryotherapy   Informed consent: discussed and consent obtained   Timeout:  patient name, date of birth, surgical site, and procedure verified Lesion destroyed using liquid nitrogen: Yes   Region frozen until ice ball extended beyond lesion: Yes   Outcome: patient tolerated procedure well with no complications   Post-procedure details: wound care instructions given    Lentigines right nasal side wall  Benign-appearing.  Observation.  Call clinic for new or changing moles.  Recommend daily use of broad spectrum spf 30+ sunscreen to sun-exposed areas.  Seborrheic keratosis Left Flank  Biopsy proven Seborrheic keratosis   Loose area of scab removed in the office   Start Mupirocin ointment or vaseline daily under bandage until healed  Return in about 2 months (around  10/11/2020) for Aks, SCC.    I, Marye Round, CMA, am acting as scribe for Forest Gleason, MD .   Documentation: I have reviewed the above documentation for accuracy and completeness, and I agree with the above.  Forest Gleason, MD

## 2020-08-13 ENCOUNTER — Other Ambulatory Visit: Payer: Self-pay

## 2020-08-18 ENCOUNTER — Other Ambulatory Visit: Payer: Self-pay

## 2020-08-18 ENCOUNTER — Ambulatory Visit (INDEPENDENT_AMBULATORY_CARE_PROVIDER_SITE_OTHER): Payer: Medicare Other

## 2020-08-18 ENCOUNTER — Ambulatory Visit (INDEPENDENT_AMBULATORY_CARE_PROVIDER_SITE_OTHER): Payer: Medicare Other | Admitting: Family Medicine

## 2020-08-18 ENCOUNTER — Encounter: Payer: Self-pay | Admitting: Family Medicine

## 2020-08-18 VITALS — BP 130/80 | HR 88 | Temp 96.5°F | Ht 66.0 in | Wt 184.8 lb

## 2020-08-18 DIAGNOSIS — R0609 Other forms of dyspnea: Secondary | ICD-10-CM

## 2020-08-18 DIAGNOSIS — R0600 Dyspnea, unspecified: Secondary | ICD-10-CM

## 2020-08-18 DIAGNOSIS — E785 Hyperlipidemia, unspecified: Secondary | ICD-10-CM

## 2020-08-18 DIAGNOSIS — I10 Essential (primary) hypertension: Secondary | ICD-10-CM | POA: Diagnosis not present

## 2020-08-18 DIAGNOSIS — J309 Allergic rhinitis, unspecified: Secondary | ICD-10-CM | POA: Diagnosis not present

## 2020-08-18 LAB — BASIC METABOLIC PANEL
BUN: 24 mg/dL — ABNORMAL HIGH (ref 6–23)
CO2: 30 mEq/L (ref 19–32)
Calcium: 10.4 mg/dL (ref 8.4–10.5)
Chloride: 100 mEq/L (ref 96–112)
Creatinine, Ser: 0.91 mg/dL (ref 0.40–1.20)
GFR: 60.83 mL/min (ref >60.00)
Glucose, Bld: 98 mg/dL (ref 70–99)
Potassium: 4.2 mEq/L (ref 3.5–5.1)
Sodium: 137 mEq/L (ref 135–145)

## 2020-08-18 LAB — CBC
HCT: 39.5 % (ref 36.0–46.0)
Hemoglobin: 13.1 g/dL (ref 12.0–15.0)
MCHC: 33.2 g/dL (ref 30.0–36.0)
MCV: 96.1 fl (ref 78.0–100.0)
Platelets: 276 10*3/uL (ref 150.0–400.0)
RBC: 4.11 Mil/uL (ref 3.87–5.11)
RDW: 13.4 % (ref 11.5–15.5)
WBC: 7.5 10*3/uL (ref 4.0–10.5)

## 2020-08-18 LAB — TSH: TSH: 3.48 u[IU]/mL (ref 0.35–4.50)

## 2020-08-18 MED ORDER — AZELASTINE HCL 0.1 % NA SOLN: 2.0000 | Freq: Two times a day (BID) | NASAL | 12 refills | Status: DC

## 2020-08-18 NOTE — Assessment & Plan Note (Signed)
Chronic issue.  We will try Astelin to see if that is beneficial.

## 2020-08-18 NOTE — Assessment & Plan Note (Addendum)
This has been an ongoing issue for at least several months per her report.  She does have some intermittent cough in the morning though that seems to be more related to allergic rhinitis and postnasal drip.  EKG will be performed today.  We will get a chest x-ray and check lab work to help evaluate for potential causes.  Discussed the potential for cardiology referral if no cause is found on labs and x-ray.

## 2020-08-18 NOTE — Progress Notes (Signed)
Tommi Rumps, MD Phone: [phone]  Sabrina Wilson is a 80 y.o. female who presents today for f/u.  HYPERTENSION  Disease Monitoring  Home BP Monitoring 867Y systolic Chest pain- no    Dyspnea- see below Medications  Compliance-  Taking HCTZ.  Edema- no  HYPERLIPIDEMIA Symptoms Chest pain on exertion:  no    Medications: Compliance- taking lipitor Right upper quadrant pain- no  Muscle aches- no  Dyspnea on exertion: Patient notes this is been going on for many months.  Possibly slightly worse progressively.  She has chronic cough in the morning which she gets mucus up.  She does have chronic postnasal drip and has been using Flonase for at least 3 weeks with no benefit.  She does not take an allergy pill very rarely has any reflux.  She notes no chest pain with exertion.   Social History   Tobacco Use  Smoking Status Former Smoker  . Quit date: 10/12/2007  . Years since quitting: 12.8  Smokeless Tobacco Never Used    Current Outpatient Medications on File Prior to Visit  Medication Sig Dispense Refill  . atorvastatin (LIPITOR) 10 MG tablet TAKE ONE TABLET BY MOUTH DAILY 90 tablet 1  . Calcium Carbonate-Vit D-Min (CALCIUM 1200 PO) Take by mouth daily.    . hydrochlorothiazide (HYDRODIURIL) 12.5 MG tablet Take 1 tablet (12.5 mg total) by mouth daily. 90 tablet 1  . losartan (COZAAR) 50 MG tablet TAKE TWO TABLETS BY MOUTH DAILY 180 tablet 0  . mupirocin ointment (BACTROBAN) 2 % Apply 1 application topically 2 (two) times daily. 22 g 0  . Omega-3 Fatty Acids (FISH OIL PO) Take by mouth.    . triamcinolone cream (KENALOG) 0.1 % Apply 1 application topically 2 (two) times daily. 30 g 0  . VITAMIN D, CHOLECALCIFEROL, PO Take 2,000 Units by mouth daily.     No current facility-administered medications on file prior to visit.     ROS see history of present illness  Objective  Physical Exam Vitals:   08/18/20 1229 08/18/20 1251  BP: 140/70 130/80  Pulse: 88   Temp:  (!) 96.5 F (35.8 C)   SpO2: 93%     BP Readings from Last 3 Encounters:  08/18/20 130/80  02/26/20 130/70  02/18/20 (!) 169/88   Wt Readings from Last 3 Encounters:  08/18/20 184 lb 12.8 oz (83.8 kg)  02/26/20 186 lb 9.6 oz (84.6 kg)  02/18/20 186 lb (84.4 kg)    Physical Exam Constitutional:      General: She is not in acute distress.    Appearance: She is not diaphoretic.  Cardiovascular:     Rate and Rhythm: Normal rate and regular rhythm.     Heart sounds: Normal heart sounds.  Pulmonary:     Effort: Pulmonary effort is normal.     Breath sounds: Normal breath sounds.  Musculoskeletal:     Right lower leg: No edema.     Left lower leg: No edema.  Skin:    General: Skin is warm and dry.  Neurological:     Mental Status: She is alert.    EKG: Sinus rhythm, rate 73, left axis, old anteroseptal infarct, no acute ischemic changes  Assessment/Plan: Please see individual problem list.  Problem List Items Addressed This Visit    Hypertension    Adequately controlled at home.  She will continue HCTZ 12.5 mg once daily.  Check BMP.      Allergic rhinitis    Chronic issue.  We will try Astelin to see if that is beneficial.      Hyperlipidemia    Continue Lipitor 10 mg once daily.      DOE (dyspnea on exertion) - Primary    This has been an ongoing issue for at least several months per her report.  She does have some intermittent cough in the morning though that seems to be more related to allergic rhinitis and postnasal drip.  EKG will be performed today.  We will get a chest x-ray and check lab work to help evaluate for potential causes.  Discussed the potential for cardiology referral if no cause is found on labs and x-ray.      Relevant Orders   EKG 12-Lead (Completed)   DG Chest 2 View   CBC   TSH   Basic Metabolic Panel (BMET)      This visit occurred during the SARS-CoV-2 public health emergency.  Safety protocols were in place, including screening  questions prior to the visit, additional usage of staff PPE, and extensive cleaning of exam room while observing appropriate contact time as indicated for disinfecting solutions.    Tommi Rumps, MD [place]

## 2020-08-18 NOTE — Patient Instructions (Signed)
Nice to see you. We will get some labs and an x-ray today.  We will contact you with the results and then determine next step in management. We will try a different nose spray for your postnasal drip symptoms.

## 2020-08-18 NOTE — Assessment & Plan Note (Signed)
Continue Lipitor 10 mg once daily. 

## 2020-08-18 NOTE — Assessment & Plan Note (Signed)
Adequately controlled at home.  She will continue HCTZ 12.5 mg once daily.  Check BMP.

## 2020-08-20 ENCOUNTER — Other Ambulatory Visit: Payer: Self-pay | Admitting: Family Medicine

## 2020-08-20 DIAGNOSIS — R0600 Dyspnea, unspecified: Secondary | ICD-10-CM

## 2020-08-20 DIAGNOSIS — R0609 Other forms of dyspnea: Secondary | ICD-10-CM

## 2020-08-24 ENCOUNTER — Other Ambulatory Visit: Payer: Self-pay

## 2020-08-24 ENCOUNTER — Ambulatory Visit
Admission: RE | Admit: 2020-08-24 | Discharge: 2020-08-24 | Disposition: A | Discharge status: Home / Self Care | Payer: Medicare Other | Source: Ambulatory Visit | Attending: Family Medicine | Admitting: Family Medicine

## 2020-08-24 DIAGNOSIS — Z1231 Encounter for screening mammogram for malignant neoplasm of breast: Secondary | ICD-10-CM | POA: Insufficient documentation

## 2020-08-25 ENCOUNTER — Telehealth: Payer: Self-pay

## 2020-08-25 NOTE — Telephone Encounter (Signed)
Called and informed patient of biopsy results on Left lateral ankle proximal. Patient states she was already informed of results and had removed. Tried to explain to patient results were at different locations at left ankle. Patient refused to accept information and schedule follow up to have area treated with Ln2. Patient has follow up in June. Will inform provider.

## 2020-08-25 NOTE — Telephone Encounter (Signed)
-----   Message from [place], MD sent at 08/17/2020 10:28 AM EDT ----- Skin , left lateral ankle proximal ACTINIC KERATOSIS AND ANGIODERMATITIS "precancer spot and changes in the skin due to leg swelling or trauma" --> LN2  This is a precancerous spot. A small portion of these spots will turn into squamous cell skin cancer with time.  However, we are not sure which spots will progress to skin cancer and which ones will stay the same so we typically recommend treating them.  For this reason, I would recommend that we treat it with liquid nitrogen in clinic.   MAs please call. Thank you!

## 2020-09-10 ENCOUNTER — Ambulatory Visit: Payer: Medicare Other | Admitting: Dermatology

## 2020-09-14 ENCOUNTER — Encounter: Payer: Self-pay | Admitting: Cardiology

## 2020-09-14 ENCOUNTER — Ambulatory Visit: Payer: Medicare Other | Admitting: Cardiology

## 2020-09-14 ENCOUNTER — Other Ambulatory Visit: Payer: Self-pay

## 2020-09-14 VITALS — BP 134/66 | HR 70 | Ht 67.0 in | Wt 186.0 lb

## 2020-09-14 DIAGNOSIS — E7800 Pure hypercholesterolemia, unspecified: Secondary | ICD-10-CM

## 2020-09-14 DIAGNOSIS — R0609 Other forms of dyspnea: Secondary | ICD-10-CM

## 2020-09-14 DIAGNOSIS — I10 Essential (primary) hypertension: Secondary | ICD-10-CM

## 2020-09-14 DIAGNOSIS — R0600 Dyspnea, unspecified: Secondary | ICD-10-CM | POA: Diagnosis not present

## 2020-09-14 NOTE — Progress Notes (Signed)
Cardiology Office Note:    Date:  09/14/2020   ID:  Sabrina Wilson, DOB October 23, 1940, MRN [id number]  PCP:  Leone Haven, MD   [facility] Providers Cardiologist:  None     Referring MD: Leone Haven, MD   Chief Complaint  Patient presents with  . New Patient (Initial Visit)    Referred by PCP for DOE. Meds reviewed verbally with patient.     History of Present Illness:    Sabrina Wilson is a 80 y.o. female with a hx of hypertension, hyperlipidemia who presents due to shortness of breath.  Patient has worsening shortness of breath with exertion over the past 2 months.  She denies chest pain or palpitations.  Previously had some lower extremity edema, resolved since stopping Norvasc.  Currently takes HCTZ for BP control, blood pressures are well controlled.  Denies any changes in weight.  Past Medical History:  Diagnosis Date  . Actinic keratosis 02/12/2020   R lat calf (hypertrophic)  . Arthritis   . Cancer of sigmoid ([place]) 06/17/2016  . Colon cancer [facility]) 2009   T3,N0; s/p resection  Dr. Hulda Humphrey and chemotherapy by Dr. Cynda Acres  . Hernia of flank   . Hypertension   . Neuropathy   . Polyp at cervical os    s/p resection Dr. Sabra Heck  . Skin cancer 01/22/2020   surface of an atypical verrucous squamous proliferation   . Squamous cell carcinoma of skin 07/16/2020   Left lateral ankle, treated with [facility] 08-11-2020    Past Surgical History:  Procedure Laterality Date  . BREAST BIOPSY Right 05/2014   Dr. Dwyane Luo office-benign  . BREAST SURGERY Right February 2016   Vacuum assisted biopsy for recurrent cyst, fibrocystic changes without evidence of malignancy.  Marland Kitchen CATARACT EXTRACTION W/PHACO Left 01/13/2015   Procedure: CATARACT EXTRACTION PHACO AND INTRAOCULAR LENS PLACEMENT (IOC);  Surgeon: Birder Robson, MD;  Location: [facility] ORS;  Service: Ophthalmology;  Laterality: Left;  [place]  1:02.9 AP   19.2 CDE  12.06 casette lot #  [id number]  . CATARACT EXTRACTION W/PHACO  Right 02/03/2015   Procedure: CATARACT EXTRACTION PHACO AND INTRAOCULAR LENS PLACEMENT ([place]);  Surgeon: Birder Robson, MD;  Location: [facility] ORS;  Service: Ophthalmology;  Laterality: Right;  [id number] ap46.5 [id number]  . COLECTOMY    . COLONOSCOPY  2015   Dr Candace Cruise  . COLONOSCOPY WITH PROPOFOL N/A 09/13/2017   Procedure: COLONOSCOPY WITH PROPOFOL;  Surgeon: Robert Bellow, MD;  Location: [facility] ENDOSCOPY;  Service: Endoscopy;  Laterality: N/A;  . COLONOSCOPY WITH PROPOFOL N/A 11/08/2017   Procedure: COLONOSCOPY WITH PROPOFOL;  Surgeon: Robert Bellow, MD;  Location: [facility] ENDOSCOPY;  Service: Endoscopy;  Laterality: N/A;  . colostomy reversal    . DILATION AND CURETTAGE OF UTERUS  2014   Dr. Sabra Heck, benign per pt  . EYE SURGERY    . HERNIA REPAIR  07/13/12   ventral   . TONSILLECTOMY      Current Medications: Current Meds  Medication Sig  . atorvastatin (LIPITOR) 10 MG tablet TAKE ONE TABLET BY MOUTH DAILY  . azelastine (ASTELIN) 0.1 % nasal spray Place 2 sprays into both nostrils 2 (two) times daily. Use in each nostril as directed  . Calcium Carbonate-Vit D-Min (CALCIUM 1200 PO) Take by mouth daily.  . hydrochlorothiazide (HYDRODIURIL) 12.5 MG tablet Take 1 tablet (12.5 mg total) by mouth daily.  . mupirocin ointment (BACTROBAN) 2 % Apply 1 application topically 2 (two) times daily.  . Omega-3 Fatty Acids (  FISH OIL PO) Take by mouth.  Marland Kitchen VITAMIN D, CHOLECALCIFEROL, PO Take 2,000 Units by mouth daily.     Allergies:   Sulfa antibiotics   Social History   Socioeconomic History  . Marital status: Married    Spouse name: Not on file  . Number of children: Not on file  . Years of education: Not on file  . Highest education level: Not on file  Occupational History  . Not on file  Tobacco Use  . Smoking status: Former Smoker    Quit date: 10/12/2007    Years since quitting: 12.9  . Smokeless tobacco: Never Used  Vaping Use  . Vaping Use: Never used  Substance and Sexual  Activity  . Alcohol use: Yes    Alcohol/week: 1.0 standard drink    Types: 1 Standard drinks or equivalent per week    Comment: with dinner  . Drug use: No  . Sexual activity: Not on file  Other Topics Concern  . Not on file  Social History Narrative   Lives in [place] with husband. 2 children, son lives nearby.      Diet - regular      Exercise - none   Social Determinants of Health   Financial Resource Strain: Not on file  Food Insecurity: Not on file  Transportation Needs: Not on file  Physical Activity: Not on file  Stress: Not on file  Social Connections: Not on file     Family History: The patient's family history includes Cancer in her brother, brother, and father; Diabetes in her mother; Stroke in her mother.  ROS:   Please see the history of present illness.     All other systems reviewed and are negative.  EKGs/Labs/Other Studies Reviewed:    The following studies were reviewed today:   EKG:  EKG is  ordered today.  The ekg ordered today demonstrates normal sinus rhythm  Recent Labs: 11/26/2019: ALT 16 08/18/2020: BUN 24; Creatinine, Ser 0.91; Hemoglobin 13.1; Platelets 276.0; Potassium 4.2; Sodium 137; TSH 3.48  Recent Lipid Panel    Component Value Date/Time   CHOL 205 (H) 11/26/2019 1204   TRIG 58.0 11/26/2019 1204   HDL 111.80 11/26/2019 1204   CHOLHDL 2 11/26/2019 1204   VLDL 11.6 11/26/2019 1204   LDLCALC 81 11/26/2019 1204   LDLDIRECT 66.0 09/08/2017 0925     Risk Assessment/Calculations:      Physical Exam:    VS:  BP 134/66 (BP Location: Left Arm, Patient Position: Sitting, Cuff Size: Normal)   Pulse 70   Ht 5\' 7"  (1.702 m)   Wt 186 lb (84.4 kg)   SpO2 98%   BMI 29.13 kg/m     Wt Readings from Last 3 Encounters:  09/14/20 186 lb (84.4 kg)  08/18/20 184 lb 12.8 oz (83.8 kg)  02/26/20 186 lb 9.6 oz (84.6 kg)     GEN:  Well nourished, well developed in no acute distress HEENT: Normal NECK: No JVD; No carotid  bruits LYMPHATICS: No lymphadenopathy CARDIAC: RRR, no murmurs, rubs, gallops RESPIRATORY:  Clear to auscultation without rales, wheezing or rhonchi  ABDOMEN: Soft, non-tender, non-distended MUSCULOSKELETAL:  No edema; No deformity  SKIN: Warm and dry NEUROLOGIC:  Alert and oriented x 3 PSYCHIATRIC:  Normal affect   ASSESSMENT:    1. Dyspnea on exertion   2. Primary hypertension   3. Pure hypercholesterolemia    PLAN:    In order of problems listed above:  1. Dyspnea on exertion, this could  be an anginal equivalent.  Risk factors hypertension, hyperlipidemia.  Get echocardiogram to evaluate systolic and diastolic function.  Get Lexiscan Myoview to evaluate presence of ischemia. 2. Hypertension, BP controlled.  Continue HCTZ. 3. Hyperlipidemia, continue statin.  Follow-up after echocardiogram and Myoview.   Shared Decision Making/Informed Consent The risks [chest pain, shortness of breath, cardiac arrhythmias, dizziness, blood pressure fluctuations, myocardial infarction, stroke/transient ischemic attack, nausea, vomiting, allergic reaction, radiation exposure, metallic taste sensation and life-threatening complications (estimated to be 1 in 10,000)], benefits (risk stratification, diagnosing coronary artery disease, treatment guidance) and alternatives of a nuclear stress test were discussed in detail with Ms. Lesage and she agrees to proceed.  Medication Adjustments/Labs and Tests Ordered: Current medicines are reviewed at length with the patient today.  Concerns regarding medicines are outlined above.  Orders Placed This Encounter  Procedures  . NM Myocar Multi W/Spect W/Wall Motion / EF  . EKG 12-Lead  . ECHOCARDIOGRAM COMPLETE   No orders of the defined types were placed in this encounter.   Patient Instructions  Medication Instructions:   Your physician recommends that you continue on your current medications as directed. Please refer to the Current Medication list  given to you today.  *If you need a refill on your cardiac medications before your next appointment, please call your pharmacy*   Lab Work: None ordered If you have labs (blood work) drawn today and your tests are completely normal, you will receive your results only by: Marland Kitchen MyChart Message (if you have MyChart) OR . A paper copy in the mail If you have any lab test that is abnormal or we need to change your treatment, we will call you to review the results.   Testing/Procedures:  1.  Your physician has requested that you have an echocardiogram. Echocardiography is a painless test that uses sound waves to create images of your heart. It provides your doctor with information about the size and shape of your heart and how well your heart's chambers and valves are working. This procedure takes approximately one hour. There are no restrictions for this procedure.  2.  [place]       Your caregiver has ordered a Stress Test with nuclear imaging. The purpose of this test is to evaluate the blood supply to your heart muscle. This procedure is referred to as a "Non-Invasive Stress Test." This is because other than having an IV started in your vein, nothing is inserted or "invades" your body. Cardiac stress tests are done to find areas of poor blood flow to the heart by determining the extent of coronary artery disease (CAD). Some patients exercise on a treadmill, which naturally increases the blood flow to your heart, while others who are  unable to walk on a treadmill due to physical limitations have a pharmacologic/chemical stress agent called Lexiscan . This medicine will mimic walking on a treadmill by temporarily increasing your coronary blood flow.      PLEASE REPORT TO [facility] MEDICAL MALL ENTRANCE   THE VOLUNTEERS AT THE FIRST DESK WILL DIRECT YOU WHERE TO GO     *Please note: these test may take anywhere between 2-4 hours to complete       Date of  Procedure:_____________________________________   Arrival Time for Procedure:______________________________    PLEASE NOTIFY THE OFFICE AT LEAST 24 HOURS IN ADVANCE IF YOU ARE UNABLE TO KEEP YOUR APPOINTMENT.  [place] 24 HOURS IN ADVANCE IF YOU ARE UNABLE TO  KEEP YOUR APPOINTMENT. [phone]       How to prepare for your Myoview test:        _XX___:  Hold other medications as follows: hydrochlorothiazide (HYDRODIURIL)     1. Do not eat or drink after midnight  2. No caffeine for 24 hours prior to test  3. No smoking 24 hours prior to test.  4. Unless instructed otherwise, Take your medication with a small sips of water.    5.         Ladies, please do not wear dresses. Skirts or pants are appropriate. Please wear a short sleeve shirt.  6. No perfume, cologne or lotion.  7. Wear comfortable walking shoes. No heels!      Follow-Up; At [facility], you and your health needs are our priority.  As part of our continuing mission to provide you with exceptional heart care, we have created designated Provider Care Teams.  These Care Teams include your primary Cardiologist (physician) and Advanced Practice Providers (APPs -  Physician Assistants and Nurse Practitioners) who all work together to provide you with the care you need, when you need it.  We recommend signing up for the patient portal called "MyChart".  Sign up information is provided on this After Visit Summary.  MyChart is used to connect with patients for Virtual Visits (Telemedicine).  Patients are able to view lab/test results, encounter notes, upcoming appointments, etc.  Non-urgent messages can be sent to your provider as well.   To learn more about what you can do with MyChart, go to [email or web address].    Your next appointment:   Follow up after testing   The format for your next appointment:   In Person  Provider:   Kate Sable, MD   Other  Instructions      Signed, Kate Sable, MD  09/14/2020 1:01 PM    [place]

## 2020-09-14 NOTE — Patient Instructions (Signed)
Medication Instructions:   Your physician recommends that you continue on your current medications as directed. Please refer to the Current Medication list given to you today.  *If you need a refill on your cardiac medications before your next appointment, please call your pharmacy*   Lab Work: None ordered If you have labs (blood work) drawn today and your tests are completely normal, you will receive your results only by: Marland Kitchen MyChart Message (if you have MyChart) OR . A paper copy in the mail If you have any lab test that is abnormal or we need to change your treatment, we will call you to review the results.   Testing/Procedures:  1.  Your physician has requested that you have an echocardiogram. Echocardiography is a painless test that uses sound waves to create images of your heart. It provides your doctor with information about the size and shape of your heart and how well your heart's chambers and valves are working. This procedure takes approximately one hour. There are no restrictions for this procedure.  2.  [place]       Your caregiver has ordered a Stress Test with nuclear imaging. The purpose of this test is to evaluate the blood supply to your heart muscle. This procedure is referred to as a "Non-Invasive Stress Test." This is because other than having an IV started in your vein, nothing is inserted or "invades" your body. Cardiac stress tests are done to find areas of poor blood flow to the heart by determining the extent of coronary artery disease (CAD). Some patients exercise on a treadmill, which naturally increases the blood flow to your heart, while others who are  unable to walk on a treadmill due to physical limitations have a pharmacologic/chemical stress agent called Lexiscan . This medicine will mimic walking on a treadmill by temporarily increasing your coronary blood flow.      PLEASE REPORT TO [facility] MEDICAL MALL ENTRANCE   THE VOLUNTEERS AT THE FIRST DESK WILL  DIRECT YOU WHERE TO GO     *Please note: these test may take anywhere between 2-4 hours to complete       Date of Procedure:_____________________________________   Arrival Time for Procedure:______________________________    PLEASE NOTIFY THE OFFICE AT LEAST 24 HOURS IN ADVANCE IF YOU ARE UNABLE TO KEEP YOUR APPOINTMENT.  [place] 24 HOURS IN ADVANCE IF YOU ARE UNABLE TO KEEP YOUR APPOINTMENT. [phone]       How to prepare for your Myoview test:        _XX___:  Hold other medications as follows: hydrochlorothiazide (HYDRODIURIL)     1. Do not eat or drink after midnight  2. No caffeine for 24 hours prior to test  3. No smoking 24 hours prior to test.  4. Unless instructed otherwise, Take your medication with a small sips of water.    5.         Ladies, please do not wear dresses. Skirts or pants are appropriate. Please wear a short sleeve shirt.  6. No perfume, cologne or lotion.  7. Wear comfortable walking shoes. No heels!      Follow-Up; At [facility], you and your health needs are our priority.  As part of our continuing mission to provide you with exceptional heart care, we have created designated Provider Care Teams.  These Care Teams include your primary Cardiologist (physician) and Advanced Practice Providers (APPs -  Physician Assistants and Nurse Practitioners)  who all work together to provide you with the care you need, when you need it.  We recommend signing up for the patient portal called "MyChart".  Sign up information is provided on this After Visit Summary.  MyChart is used to connect with patients for Virtual Visits (Telemedicine).  Patients are able to view lab/test results, encounter notes, upcoming appointments, etc.  Non-urgent messages can be sent to your provider as well.   To learn more about what you can do with MyChart, go to [email or web address].    Your next appointment:   Follow  up after testing   The format for your next appointment:   In Person  Provider:   Kate Sable, MD   Other Instructions

## 2020-09-15 ENCOUNTER — Encounter: Payer: Self-pay | Admitting: Family Medicine

## 2020-09-18 ENCOUNTER — Other Ambulatory Visit: Payer: Self-pay

## 2020-09-18 ENCOUNTER — Ambulatory Visit (INDEPENDENT_AMBULATORY_CARE_PROVIDER_SITE_OTHER): Payer: Medicare Other

## 2020-09-18 DIAGNOSIS — R0600 Dyspnea, unspecified: Secondary | ICD-10-CM | POA: Diagnosis not present

## 2020-09-18 DIAGNOSIS — R0609 Other forms of dyspnea: Secondary | ICD-10-CM

## 2020-09-19 LAB — ECHOCARDIOGRAM COMPLETE
AR max vel: 2.34 cm2
AV Area VTI: 2.46 cm2
AV Area mean vel: 2.34 cm2
AV Mean grad: 4 mmHg
AV Peak grad: 8.4 mmHg
Ao pk vel: 1.45 m/s
Area-P 1/2: 3.23 cm2
Calc EF: 53.8 %
S' Lateral: 2.8 cm
Single Plane A2C EF: 52 %
Single Plane A4C EF: 53.5 %

## 2020-09-28 ENCOUNTER — Encounter
Admission: RE | Admit: 2020-09-28 | Discharge: 2020-09-28 | Disposition: A | Discharge status: Home / Self Care | Payer: Medicare Other | Source: Ambulatory Visit | Attending: Cardiology | Admitting: Cardiology

## 2020-09-28 ENCOUNTER — Other Ambulatory Visit: Payer: Self-pay

## 2020-09-28 DIAGNOSIS — R0609 Other forms of dyspnea: Secondary | ICD-10-CM

## 2020-09-28 DIAGNOSIS — R0600 Dyspnea, unspecified: Secondary | ICD-10-CM | POA: Diagnosis not present

## 2020-09-28 LAB — NM MYOCAR MULTI W/SPECT W/WALL MOTION / EF
Estimated workload: 1 METS
Exercise duration (min): 0 min
Exercise duration (sec): 0 s
LV dias vol: 59 mL (ref 46–106)
LV sys vol: 18 mL
MPHR: 143 {beats}/min
Peak HR: 85 {beats}/min
Percent HR: 59 %
Rest HR: 63 {beats}/min
SDS: 0
SRS: 2
SSS: 1
TID: 1.24

## 2020-09-28 MED ORDER — REGADENOSON 0.4 MG/5ML IV SOLN
0.4000 mg | Freq: Once | INTRAVENOUS | Status: AC
  Administered 2020-09-28: 0.4 mg via INTRAVENOUS

## 2020-09-28 MED ORDER — TECHNETIUM TC 99M TETROFOSMIN IV KIT
10.0000 | PACK | Freq: Once | INTRAVENOUS | Status: AC | PRN
  Administered 2020-09-28: 10.45 via INTRAVENOUS

## 2020-09-28 MED ORDER — TECHNETIUM TC 99M TETROFOSMIN IV KIT
30.8200 | PACK | Freq: Once | INTRAVENOUS | Status: AC | PRN
  Administered 2020-09-28: 30.82 via INTRAVENOUS

## 2020-09-29 ENCOUNTER — Encounter: Payer: Self-pay | Admitting: Nurse Practitioner

## 2020-09-29 ENCOUNTER — Ambulatory Visit: Payer: Medicare Other | Admitting: Nurse Practitioner

## 2020-09-29 VITALS — BP 160/84 | HR 82 | Ht 66.5 in | Wt 188.0 lb

## 2020-09-29 DIAGNOSIS — I340 Nonrheumatic mitral (valve) insufficiency: Secondary | ICD-10-CM

## 2020-09-29 DIAGNOSIS — R0609 Other forms of dyspnea: Secondary | ICD-10-CM

## 2020-09-29 DIAGNOSIS — I5189 Other ill-defined heart diseases: Secondary | ICD-10-CM

## 2020-09-29 DIAGNOSIS — I2510 Atherosclerotic heart disease of native coronary artery without angina pectoris: Secondary | ICD-10-CM

## 2020-09-29 DIAGNOSIS — E782 Mixed hyperlipidemia: Secondary | ICD-10-CM | POA: Diagnosis not present

## 2020-09-29 DIAGNOSIS — I10 Essential (primary) hypertension: Secondary | ICD-10-CM | POA: Diagnosis not present

## 2020-09-29 DIAGNOSIS — R0600 Dyspnea, unspecified: Secondary | ICD-10-CM | POA: Diagnosis not present

## 2020-09-29 NOTE — Patient Instructions (Signed)
Medication Instructions:  No changes at this time.  *If you need a refill on your cardiac medications before your next appointment, please call your pharmacy*   Lab Work: None  If you have labs (blood work) drawn today and your tests are completely normal, you will receive your results only by: Marland Kitchen MyChart Message (if you have MyChart) OR . A paper copy in the mail If you have any lab test that is abnormal or we need to change your treatment, we will call you to review the results.   Testing/Procedures: None   Follow-Up: At [facility], you and your health needs are our priority.  As part of our continuing mission to provide you with exceptional heart care, we have created designated Provider Care Teams.  These Care Teams include your primary Cardiologist (physician) and Advanced Practice Providers (APPs -  Physician Assistants and Nurse Practitioners) who all work together to provide you with the care you need, when you need it.  Your next appointment:   As needed  Provider:   Kate Sable, MD

## 2020-09-29 NOTE — Progress Notes (Signed)
Office Visit    Patient Name: Sabrina Wilson Date of Encounter: 09/29/2020  Primary Care Provider:  Leone Haven, MD Primary Cardiologist:  Kate Sable, MD  Chief Complaint   80 y/o ? with a history of hypertension, hyperlipidemia, and colon cancer, who presents for follow-up related to dyspnea on exertion with recent normal stress test and echocardiogram.  Past Medical History    Past Medical History:  Diagnosis Date  . Actinic keratosis 02/12/2020   R lat calf (hypertrophic)  . Arthritis   . Cancer of sigmoid ([place]) 06/17/2016  . Colon cancer [facility]) 2009   T3,N0; s/p resection  Dr. Hulda Humphrey and chemotherapy by Dr. Cynda Acres  . Diastolic dysfunction    a. 08/2020 Echo: EF 60-65%, no rwma, Gr1 DD, nl RV size/fxn. Mild MR.  Marland Kitchen Hernia of flank   . History of actinic keratoses 08/11/2020   Bx proven at left lateral ankle proximal  . History of stress test    a. 09/2020 MV: EF >65%. No ischemia/infarct. Mild Ao Ca2+ and minimal Cor Ca2+.  . Hypertension   . Neuropathy   . Polyp at cervical os    s/p resection Dr. Sabra Heck  . Skin cancer 01/22/2020   surface of an atypical verrucous squamous proliferation   . Squamous cell carcinoma of skin 07/16/2020   Left lateral ankle, treated with [facility] 08-11-2020   Past Surgical History:  Procedure Laterality Date  . BREAST BIOPSY Right 05/2014   Dr. Dwyane Luo office-benign  . BREAST SURGERY Right February 2016   Vacuum assisted biopsy for recurrent cyst, fibrocystic changes without evidence of malignancy.  Marland Kitchen CATARACT EXTRACTION W/PHACO Left 01/13/2015   Procedure: CATARACT EXTRACTION PHACO AND INTRAOCULAR LENS PLACEMENT (IOC);  Surgeon: Birder Robson, MD;  Location: [facility] ORS;  Service: Ophthalmology;  Laterality: Left;  [place]  1:02.9 AP   19.2 CDE  12.06 casette lot #  [id number]  . CATARACT EXTRACTION W/PHACO Right 02/03/2015   Procedure: CATARACT EXTRACTION PHACO AND INTRAOCULAR LENS PLACEMENT ([place]);  Surgeon: Birder Robson, MD;   Location: [facility] ORS;  Service: Ophthalmology;  Laterality: Right;  [id number]:53 ap46.5 [id number]  . COLECTOMY    . COLONOSCOPY  2015   Dr Candace Cruise  . COLONOSCOPY WITH PROPOFOL N/A 09/13/2017   Procedure: COLONOSCOPY WITH PROPOFOL;  Surgeon: Robert Bellow, MD;  Location: [facility] ENDOSCOPY;  Service: Endoscopy;  Laterality: N/A;  . COLONOSCOPY WITH PROPOFOL N/A 11/08/2017   Procedure: COLONOSCOPY WITH PROPOFOL;  Surgeon: Robert Bellow, MD;  Location: [facility] ENDOSCOPY;  Service: Endoscopy;  Laterality: N/A;  . colostomy reversal    . DILATION AND CURETTAGE OF UTERUS  2014   Dr. Sabra Heck, benign per pt  . EYE SURGERY    . HERNIA REPAIR  07/13/12   ventral   . SKIN CANCER EXCISION    . TONSILLECTOMY      Allergies  Allergies  Allergen Reactions  . Sulfa Antibiotics Hives    As a child    History of Present Illness    80 year old female with the above past medical history including hypertension, hyperlipidemia, and colon cancer.  She was recently seen in cardiology clinic in late May due to worsening dyspnea exertion over a span of 2 months.  She denies chest pain or palpitations.  She underwent echocardiography which showed normal LV function and grade 1 diastolic dysfunction.  Only mild mitral regurgitation was noted.  Stress testing was undertaken and was low risk without evidence of ischemia or infarct.  She had mild  aortic calcification with minimal coronary calcification.  Since her last visit, she has noted complete resolution of dyspnea.  She has been less active and notes that that may be playing a role but even simple things that caused her to be short of breath a few months ago now are tolerable.  She wonders maybe if she was having seasonal allergies at the time.  She is encouraged by the results of her studies.  She denies chest pain, dyspnea, palpitations, PND, orthopnea, dizziness, syncope, edema, or early satiety.  Home Medications    Prior to Admission medications   Medication Sig  Start Date End Date Taking? Authorizing Provider  atorvastatin (LIPITOR) 10 MG tablet TAKE ONE TABLET BY MOUTH DAILY 04/23/20   Leone Haven, MD  azelastine (ASTELIN) 0.1 % nasal spray Place 2 sprays into both nostrils 2 (two) times daily. Use in each nostril as directed 08/18/20   Leone Haven, MD  Calcium Carbonate-Vit D-Min (CALCIUM 1200 PO) Take by mouth daily.    [provider]  hydrochlorothiazide (HYDRODIURIL) 12.5 MG tablet Take 1 tablet (12.5 mg total) by mouth daily. 07/20/20   Leone Haven, MD  mupirocin ointment (BACTROBAN) 2 % Apply 1 application topically 2 (two) times daily. 01/22/20   Moye, [place], MD  Omega-3 Fatty Acids (FISH OIL PO) Take by mouth.    [provider]  VITAMIN D, CHOLECALCIFEROL, PO Take 2,000 Units by mouth daily.    [provider]    Review of Systems    She denies chest pain, palpitations, dyspnea, pnd, orthopnea, n, v, dizziness, syncope, edema, weight gain, or early satiety.  All other systems reviewed and are otherwise negative except as noted above.  Physical Exam    VS:  BP (!) 160/84 (BP Location: Left Arm, Patient Position: Sitting, Cuff Size: Large)   Pulse 82   Ht 5' 6.5" (1.689 m)   Wt 188 lb (85.3 kg)   SpO2 96%   BMI 29.89 kg/m  , BMI Body mass index is 29.89 kg/m. GEN: Well nourished, well developed, in no acute distress. HEENT: normal. Neck: Supple, no JVD, carotid bruits, or masses. Cardiac: RRR, no murmurs, rubs, or gallops. No clubbing, cyanosis, edema.  Radials/PT 2+ and equal bilaterally.  Respiratory:  Respirations regular and unlabored, clear to auscultation bilaterally. GI: Soft, nontender, nondistended, BS + x 4. MS: no deformity or atrophy. Skin: warm and dry, no rash. Neuro:  Strength and sensation are intact. Psych: Normal affect.  Accessory Clinical Findings    Recent stress testing and echocardiogram reviewed with patient in detail today.  Past medical history (above)  updated with results.  Lab Results  Component Value Date   WBC 7.5 08/18/2020   HGB 13.1 08/18/2020   HCT 39.5 08/18/2020   MCV 96.1 08/18/2020   PLT 276.0 08/18/2020   Lab Results  Component Value Date   CREATININE 0.91 08/18/2020   BUN 24 (H) 08/18/2020   NA 137 08/18/2020   K 4.2 08/18/2020   CL 100 08/18/2020   CO2 30 08/18/2020   Lab Results  Component Value Date   ALT 16 11/26/2019   AST 19 11/26/2019   ALKPHOS 66 11/26/2019   BILITOT 0.7 11/26/2019   Lab Results  Component Value Date   CHOL 205 (H) 11/26/2019   HDL 111.80 11/26/2019   LDLCALC 81 11/26/2019   LDLDIRECT 66.0 09/08/2017   TRIG 58.0 11/26/2019   CHOLHDL 2 11/26/2019    Lab Results  Component Value Date  HGBA1C 6.1 11/26/2019    Assessment & Plan    1.  Dyspnea on exertion: Patient previously evaluated secondary to a 37-month history of dyspnea on exertion without chest pain.  Nuclear stress testing was undertaken and was without evidence of ischemia or infarct.  Echocardiography showed normal LV function, grade 1 diastolic dysfunction, and mild mitral regurgitation.  She has had complete resolution of dyspnea symptoms since her last visit.  She now questions maybe if she was experiencing seasonal allergies at the time.  No further cardiac work-up is warranted at this time.  2.  Essential hypertension: Blood pressure is elevated today at 160/84.  I noted on previous recent evaluations, that she was trending in the 130s.  She does check her blood pressure frequently at home and notes that she is typically in the 1 20-1 30 range.  I encouraged her to continue to follow blood pressures at home and if she is consistently greater than 130, she should either contact us or her primary care provider, at which time we can consider increasing her hydrochlorothiazide to 25 mg daily.  3.  Hyperlipidemia: She remains on statin therapy with an LDL of 81 last summer and normal LFTs at that time.  4.  Aortic  calcification/coronary calcification: Noted on CT imaging as part of nuclear stress testing.  Continue statin therapy.  5.  Mild mitral regurgitation: Unlikely to have contributed to symptoms previously.  Consider repeat echo in a few years.  6.  Diastolic dysfunction: Focus on blood pressure control as outlined above.    7.  Disposition: Patient will follow up with cardiology as needed.  Murray Hodgkins, NP 09/29/2020, 2:42 PM

## 2020-10-08 ENCOUNTER — Ambulatory Visit: Payer: Medicare Other | Admitting: Dermatology

## 2020-10-08 ENCOUNTER — Other Ambulatory Visit: Payer: Self-pay

## 2020-10-08 DIAGNOSIS — Z85828 Personal history of other malignant neoplasm of skin: Secondary | ICD-10-CM

## 2020-10-08 DIAGNOSIS — L430 Hypertrophic lichen planus: Secondary | ICD-10-CM | POA: Diagnosis not present

## 2020-10-08 DIAGNOSIS — L821 Other seborrheic keratosis: Secondary | ICD-10-CM | POA: Diagnosis not present

## 2020-10-08 DIAGNOSIS — L570 Actinic keratosis: Secondary | ICD-10-CM

## 2020-10-08 DIAGNOSIS — D485 Neoplasm of uncertain behavior of skin: Secondary | ICD-10-CM

## 2020-10-08 NOTE — Patient Instructions (Addendum)
Recommend OTC [place] Rapid Relief Anti-Itch cream (pramoxine + menthol) up to 3 times per day to areas that are itchy.  Recommend Niacinamide or Nicotinamide 500mg  twice per day to lower risk of non-melanoma skin cancer by approximately 25%. This is usually available at [employer].   Wound Care Instructions  Cleanse wound gently with soap and water once a day then pat dry with clean gauze. Apply a thing coat of Petrolatum (petroleum jelly, "Vaseline") over the wound (unless you have an allergy to this). We recommend that you use a new, sterile tube of Vaseline. Do not pick or remove scabs. Do not remove the yellow or white "healing tissue" from the base of the wound.  Cover the wound with fresh, clean, nonstick gauze and secure with paper tape. You may use Band-Aids in place of gauze and tape if the would is small enough, but would recommend trimming much of the tape off as there is often too much. Sometimes Band-Aids can irritate the skin.  You should call the office for your biopsy report after 1 week if you have not already been contacted.  If you experience any problems, such as abnormal amounts of bleeding, swelling, significant bruising, significant pain, or evidence of infection, please call the office immediately.  FOR ADULT SURGERY PATIENTS: If you need something for pain relief you may take 1 extra strength Tylenol (acetaminophen) AND 2 Ibuprofen (200mg  each) together every 4 hours as needed for pain. (do not take these if you are allergic to them or if you have a reason you should not take them.) Typically, you may only need pain medication for 1 to 3 days.    Recommend taking Heliocare sun protection supplement daily in sunny weather for additional sun protection. For maximum protection on the sunniest days, you can take up to 2 capsules of regular Heliocare OR take 1 capsule of Heliocare Ultra. For prolonged exposure (such as a full day in the sun), you can repeat your dose of  the supplement 4 hours after your first dose. Heliocare can be purchased at [facility] or at [email or web address].    Recommend daily broad spectrum sunscreen SPF 30+ to sun-exposed areas, reapply every 2 hours as needed. Call for new or changing lesions.  Staying in the shade or wearing long sleeves, sun glasses (UVA+UVB protection) and wide brim hats (4-inch brim around the entire circumference of the hat) are also recommended for sun protection.   Cryotherapy Aftercare  Wash gently with soap and water everyday.   Apply Vaseline and Band-Aid daily until healed.   Prior to procedure, discussed risks of blister formation, small wound, skin dyspigmentation, or rare scar following cryotherapy. Recommend Vaseline ointment to treated areas while healing.  If you have any questions or concerns for your doctor, please call our main line at [phone] and press option 4 to reach your doctor's medical assistant. If no one answers, please leave a voicemail as directed and we will return your call as soon as possible. Messages left after 4 pm will be answered the following business day.   You may also send [place] a message via [place]. We typically respond to MyChart messages within 1-2 business days.  For prescription refills, please ask your pharmacy to contact our office. Our fax number is [phone].  If you have an urgent issue when the clinic is closed that cannot wait until the next business day, you can page your doctor at the number below.    Please note that  while we do our best to be available for urgent issues outside of office hours, we are not available 24/7.   If you have an urgent issue and are unable to reach [place], you may choose to seek medical care at your doctor's office, retail clinic, urgent care center, or emergency room.  If you have a medical emergency, please immediately call 911 or go to the emergency department.  Pager Numbers  - Dr. Nehemiah Massed: [phone]  - Dr.  Laurence Ferrari: [phone]  - Dr. Nicole Kindred: [phone]  In the event of inclement weather, please call our main line at [phone] for an update on the status of any delays or closures.  Dermatology Medication Tips: Please keep the boxes that topical medications come in in order to help keep track of the instructions about where and how to use these. Pharmacies typically print the medication instructions only on the boxes and not directly on the medication tubes.   If your medication is too expensive, please contact our office at [phone] option 4 or send [place] a message through [place].   We are unable to tell what your co-pay for medications will be in advance as this is different depending on your insurance coverage. However, we may be able to find a substitute medication at lower cost or fill out paperwork to get insurance to cover a needed medication.   If a prior authorization is required to get your medication covered by your insurance company, please allow [place] 1-2 business days to complete this process.  Drug prices often vary depending on where the prescription is filled and some pharmacies may offer cheaper prices.  The website www.goodrx.com contains coupons for medications through different pharmacies. The prices here do not account for what the cost may be with help from insurance (it may be cheaper with your insurance), but the website can give you the price if you did not use any insurance.  - You can print the associated coupon and take it with your prescription to the pharmacy.  - You may also stop by our office during regular business hours and pick up a GoodRx coupon card.  - If you need your prescription sent electronically to a different pharmacy, notify our office through [facility] or by phone at [phone] option 4.

## 2020-10-08 NOTE — Progress Notes (Signed)
Follow-Up Visit   Subjective  Sabrina Wilson is a 80 y.o. female who presents for the following: Follow-up (Patient here today for AK and SCC follow up at lower legs. Patient advises some places treated with LN2 have not cleared up.).   The following portions of the chart were reviewed this encounter and updated as appropriate:   Tobacco  Allergies  Meds  Problems  Med Hx  Surg Hx  Fam Hx       Review of Systems:  No other skin or systemic complaints except as noted in HPI or Assessment and Plan.  Objective  Well appearing patient in no apparent distress; mood and affect are within normal limits.  A focused examination was performed including legs. Relevant physical exam findings are noted in the Assessment and Plan.  Left pretibia 1.0cm pink patch R/o AK vs Other       Right pretibia x 2, left lateral ankle proximal x 1 (3) Erythematous thin papules/macules with gritty scale.         Assessment & Plan  Neoplasm of uncertain behavior of skin Left pretibia  Skin / nail biopsy Type of biopsy: tangential   Informed consent: discussed and consent obtained   Timeout: patient name, date of birth, surgical site, and procedure verified   Patient was prepped and draped in usual sterile fashion: Area prepped with isopropyl alcohol. Anesthesia: the lesion was anesthetized in a standard fashion   Anesthetic:  1% lidocaine w/ epinephrine 1-100,000 buffered w/ 8.4% NaHCO3 Instrument used: flexible razor blade   Hemostasis achieved with: aluminum chloride   Outcome: patient tolerated procedure well   Post-procedure details: wound care instructions given   Additional details:  Mupirocin and a bandage applied  Specimen 1 - Surgical pathology Differential Diagnosis: R/o AK vs Other  Check Margins: No 1.0cm pink patch   AK (actinic keratosis) (3) Right pretibia x 2, left lateral ankle proximal x 1  Prior to procedure, discussed risks of blister formation, small  wound, skin dyspigmentation, or rare scar following cryotherapy. Recommend Vaseline ointment to treated areas while healing.  Biopsy proven at left lateral ankle proximal   Destruction of lesion - Right pretibia x 2, left lateral ankle proximal x 1  Destruction method: cryotherapy   Informed consent: discussed and consent obtained   Lesion destroyed using liquid nitrogen: Yes   Cryotherapy cycles:  2 Outcome: patient tolerated procedure well with no complications   Post-procedure details: wound care instructions given    Seborrheic Keratoses - Stuck-on, waxy, tan-brown papules and/or plaques  - Benign-appearing - Discussed benign etiology and prognosis. - Observe - Call for any changes   Return for TBSE, as scheduled.  Graciella Belton, RMA, am acting as scribe for Forest Gleason, MD .  Documentation: I have reviewed the above documentation for accuracy and completeness, and I agree with the above.  Forest Gleason, MD

## 2020-10-15 ENCOUNTER — Telehealth: Payer: Self-pay

## 2020-10-15 NOTE — Telephone Encounter (Signed)
-----   Message from [place], MD sent at 10/15/2020 12:53 PM EDT ----- Skin , left pretibia LICHEN PLANUS-LIKE KERATOSIS "inflamed sun spot with no evidence of cancer or precancer. --> no additional treatment needed as long as this does not grow. If it grows, would recheck in clinic.

## 2020-10-19 ENCOUNTER — Other Ambulatory Visit: Payer: Self-pay | Admitting: Family Medicine

## 2020-10-19 DIAGNOSIS — E785 Hyperlipidemia, unspecified: Secondary | ICD-10-CM

## 2020-10-20 ENCOUNTER — Encounter: Payer: Self-pay | Admitting: Dermatology

## 2020-10-20 ENCOUNTER — Other Ambulatory Visit: Payer: Self-pay

## 2020-11-16 ENCOUNTER — Ambulatory Visit (INDEPENDENT_AMBULATORY_CARE_PROVIDER_SITE_OTHER): Payer: Medicare Other

## 2020-11-16 VITALS — BP 127/81 | Ht 66.5 in | Wt 188.0 lb

## 2020-11-16 DIAGNOSIS — Z0000 Encounter for general adult medical examination without abnormal findings: Secondary | ICD-10-CM

## 2020-11-16 NOTE — Progress Notes (Signed)
Subjective:   Sabrina Wilson is a 80 y.o. female who presents for Medicare Annual (Subsequent) preventive examination.  Review of Systems    No ROS.  Medicare Wellness Virtual Visit.  Visual/audio telehealth visit, UTA vital signs.   See social history for additional risk factors.   Cardiac Risk Factors include: advanced age (>15mn, >>9women);hypertension     Objective:    Today's Vitals   11/16/20 1323  BP: 127/81  Weight: 188 lb (85.3 kg)  Height: 5' 6.5" (1.689 m)   Body mass index is 29.89 kg/m.  Advanced Directives 11/16/2020 11/14/2019 11/13/2018 11/08/2017 09/13/2017 06/19/2017 06/17/2016  Does Patient Have a Medical Advance Directive? Yes Yes Yes No No No No  Type of AParamedicof AFordocheLiving will HLorisLiving will HMercedesLiving will - - - -  Does patient want to make changes to medical advance directive? No - Patient declined No - Patient declined No - Patient declined - - - -  Copy of [place] Chart? No - copy requested No - copy requested No - copy requested - - - -  Would patient like information on creating a medical advance directive? - - - - No - Patient declined No - Patient declined No - Patient declined    Current Medications (verified) Outpatient Encounter Medications as of 11/16/2020  Medication Sig   atorvastatin (LIPITOR) 10 MG tablet TAKE ONE TABLET BY MOUTH DAILY   azelastine (ASTELIN) 0.1 % nasal spray Place 2 sprays into both nostrils 2 (two) times daily. Use in each nostril as directed   Calcium Carbonate-Vit D-Min (CALCIUM 1200 PO) Take by mouth daily.   hydrochlorothiazide (HYDRODIURIL) 12.5 MG tablet Take 1 tablet (12.5 mg total) by mouth daily.   mupirocin ointment (BACTROBAN) 2 % Apply 1 application topically 2 (two) times daily.   Omega-3 Fatty Acids (FISH OIL PO) Take by mouth daily.   VITAMIN D, CHOLECALCIFEROL, PO Take 2,000 Units by mouth daily.    No facility-administered encounter medications on file as of 11/16/2020.    Allergies (verified) Sulfa antibiotics   History: Past Medical History:  Diagnosis Date   Actinic keratosis 02/12/2020   R lat calf (hypertrophic)   Arthritis    Cancer of sigmoid ([place] 06/17/2016   Colon cancer (HKennedy 2009   T3,N0; s/p resection  Dr. HHulda Humphreyand chemotherapy by Dr. GCynda Acres  Diastolic dysfunction    a. 08/2020 Echo: EF 60-65%, no rwma, Gr1 DD, nl RV size/fxn. Mild MR.   Hernia of flank    History of actinic keratoses 08/11/2020   Bx proven at left lateral ankle proximal   History of stress test    a. 09/2020 MV: EF >65%. No ischemia/infarct. Mild Ao Ca2+ and minimal Cor Ca2+.   Hypertension    Neuropathy    Polyp at cervical os    s/p resection Dr. MSabra Heck  Skin cancer 01/22/2020   surface of an atypical verrucous squamous proliferation    Squamous cell carcinoma of skin 07/16/2020   Left lateral ankle, [facility]   Past Surgical History:  Procedure Laterality Date   BREAST BIOPSY Right 05/2014   Dr. BDwyane Luooffice-benign   BREAST SURGERY Right February 2016   Vacuum assisted biopsy for recurrent cyst, fibrocystic changes without evidence of malignancy.   CATARACT EXTRACTION W/PHACO Left 01/13/2015   Procedure: CATARACT EXTRACTION PHACO AND INTRAOCULAR LENS PLACEMENT (IOC);  Surgeon: WBirder Robson MD;  [facility];  Service: Ophthalmology;  Laterality: Left;  [place]  1:02.9 AP   19.2 CDE  12.06 casette lot #  [id number]   CATARACT EXTRACTION W/PHACO Right 02/03/2015   Procedure: CATARACT EXTRACTION PHACO AND INTRAOCULAR LENS PLACEMENT (IOC);  Surgeon: Birder Robson, MD;  Location: [facility] ORS;  Service: Ophthalmology;  Laterality: Right;  [id number] ap46.5 [id number]   COLECTOMY     COLONOSCOPY  2015   Dr Candace Cruise   COLONOSCOPY WITH PROPOFOL N/A 09/13/2017   Procedure: COLONOSCOPY WITH PROPOFOL;  Surgeon: Robert Bellow, MD;  Location: [facility] ENDOSCOPY;  Service:  Endoscopy;  Laterality: N/A;   COLONOSCOPY WITH PROPOFOL N/A 11/08/2017   Procedure: COLONOSCOPY WITH PROPOFOL;  Surgeon: Robert Bellow, MD;  Location: [facility] ENDOSCOPY;  Service: Endoscopy;  Laterality: N/A;   colostomy reversal     DILATION AND CURETTAGE OF UTERUS  2014   Dr. Sabra Heck, benign per pt   EYE SURGERY     HERNIA REPAIR  07/13/12   ventral    SKIN CANCER EXCISION     TONSILLECTOMY     Family History  Problem Relation Age of Onset   Stroke Mother    Diabetes Mother    Cancer Father        colon   Stroke Sister    Cancer Brother        colon   Cancer Brother        brain   Social History   Socioeconomic History   Marital status: Married    Spouse name: Not on file   Number of children: Not on file   Years of education: Not on file   Highest education level: Not on file  Occupational History   Not on file  Tobacco Use   Smoking status: Former    Types: Cigarettes    Quit date: 10/12/2007    Years since quitting: 13.1   Smokeless tobacco: Never  Vaping Use   Vaping Use: Never used  Substance and Sexual Activity   Alcohol use: Yes    Alcohol/week: 1.0 standard drink    Types: 1 Standard drinks or equivalent per week    Comment: with dinner   Drug use: No   Sexual activity: Not on file  Other Topics Concern   Not on file  Social History Narrative   Lives in [place] with husband. 2 children, son lives nearby.      Diet - regular      Exercise - none   Social Determinants of Health   Financial Resource Strain: Not on file  Food Insecurity: No Food Insecurity   Worried About Charity fundraiser in the Last Year: Never true   Ran Out of Food in the Last Year: Never true  Transportation Needs: No Transportation Needs   Lack of Transportation (Medical): No   Lack of Transportation (Non-Medical): No  Physical Activity: Not on file  Stress: No Stress Concern Present   Feeling of Stress : Not at all  Social Connections: Not on file    Tobacco  Counseling Counseling given: Not Answered   Clinical Intake:  Pre-visit preparation completed: Yes        Diabetes: No  How often do you need to have someone help you when you read instructions, pamphlets, or other written materials from your doctor or pharmacy?: 1 - Never   Interpreter Needed?: No      Activities of Daily Living In your present state of health, do you have any difficulty performing the  following activities: 11/16/2020  Hearing? N  Vision? N  Difficulty concentrating or making decisions? N  Walking or climbing stairs? N  Dressing or bathing? N  Doing errands, shopping? N  Preparing Food and eating ? N  Using the Toilet? N  In the past six months, have you accidently leaked urine? N  Do you have problems with loss of bowel control? N  Managing your Medications? N  Managing your Finances? N  Housekeeping or managing your Housekeeping? N  Some recent data might be hidden    Patient Care Team: Leone Haven, MD as PCP - General (Family Medicine) Kate Sable, MD as PCP - Cardiology (Cardiology) Bary Castilla Forest Gleason, MD as Consulting Physician (General Surgery) Dallas Schimke, MD (Internal Medicine)  Indicate any recent Medical Services you may have received from other than [facility] providers in the past year (date may be approximate).     Assessment:   This is a routine wellness examination for Meena.  I connected with Shrinidhi today by telephone and verified that I am speaking with the correct person using two identifiers. Location patient: home Location provider: work Persons participating in the virtual visit: patient, Marine scientist.    I discussed the limitations, risks, security and privacy concerns of performing an evaluation and management service by telephone and the availability of in person appointments. The patient expressed understanding and verbally consented to this telephonic visit.    Interactive audio and video telecommunications  were attempted between this provider and patient, however failed, due to patient having technical difficulties OR patient did not have access to video capability.  We continued and completed visit with audio only.  Some vital signs may be absent or patient reported.   Hearing/Vision screen Hearing Screening - Comments:: Patient is able to hear conversational tones without difficulty.  No issues reported. Vision Screening - Comments:: Wears corrective lenses Cataract extraction, bilateral    Dietary issues and exercise activities discussed: Current Exercise Habits: Home exercise routine, Intensity: Mild Low carb/reduced sugar Good water intake  Goals Addressed               This Visit's Progress     Patient Stated     Maintain Healthy Lifestyle (pt-stated)        Stay active Healthy diet       Depression Screen PHQ 2/9 Scores 11/16/2020 08/18/2020 02/26/2020 11/14/2019 11/13/2018 05/09/2018 07/24/2017  PHQ - 2 Score 0 0 0 0 0 0 0    Fall Risk Fall Risk  11/16/2020 08/18/2020 02/26/2020 11/14/2019 11/13/2018  Falls in the past year? 0 0 0 0 0  Number falls in past yr: 0 0 0 0 -  Injury with Fall? 0 - - 0 -  Risk for fall due to : - - - - -  Follow up Falls evaluation completed Falls evaluation completed Falls evaluation completed Falls evaluation completed -    FALL RISK PREVENTION PERTAINING TO THE HOME: Adequate lighting in your home to reduce risk of falls? No   ASSISTIVE DEVICES UTILIZED TO PREVENT FALLS: Use of a cane, walker or w/c? No   TIMED UP AND GO: Was the test performed? No .   Cognitive Function: Patient is alert and oriented x3 Enjoys crossword puzzles and other brain health activity.  MMSE/6CIT deferred. Normal by direct communication/observation.   MMSE - Mini Mental State Exam 11/14/2019  Not completed: Unable to complete     6CIT Screen 11/13/2018  What Year? 0 points  What month? 0  points  What time? 0 points  Count back from 20 0 points  Months  in reverse 0 points  Repeat phrase 0 points  Total Score 0    Immunizations Immunization History  Administered Date(s) Administered   Fluad Quad(high Dose 65+) 12/19/2018, 12/21/2018, 01/14/2020   Hepb-cpg 12/11/2019, 01/14/2020   Influenza Split 01/19/2011, 03/26/2012   Influenza, High Dose Seasonal PF 02/03/2014, 02/07/2017, 01/31/2018   Influenza,inj,Quad PF,6+ Mos 02/07/2013, 01/22/2015, 02/01/2016   Influenza-Unspecified 02/06/2014   Moderna Sars-Covid-2 Vaccination 05/03/2019, 05/31/2019, 02/17/2020, 08/11/2020   Pneumococcal Conjugate-13 09/27/2013   Pneumococcal Polysaccharide-23 04/12/2009   Zoster Recombinat (Shingrix) 06/11/2018, 08/30/2018   Zoster, Live 02/03/2011   Health Maintenance Health Maintenance  Topic Date Due   TETANUS/TDAP  03/26/2019   INFLUENZA VACCINE  11/23/2020   COVID-19 Vaccine (5 - Booster for Moderna series) 12/11/2020   DEXA SCAN  Completed   Hepatitis C Screening  Completed   PNA vac Low Risk Adult  Completed   Zoster Vaccines- Shingrix  Completed   HPV VACCINES  Aged Out   Mammogram status: Completed 08/2020. Repeat every year  DG Chest 2 view: completed 08/19/20.  Vision Screening: Recommended annual ophthalmology exams for early detection of glaucoma and other disorders of the eye.  Dental Screening: Recommended annual dental exams for proper oral hygiene  Community Resource Referral / Chronic Care Management: CRR required this visit? No  CCM required this visit?  No     Plan:   Keep all routine maintenance appointments.   I have personally reviewed and noted the following in the patient's chart:   Medical and social history Use of alcohol, tobacco or illicit drugs  Current medications and supplements including opioid prescriptions. Patient is not currently taking opioid.  Functional ability and status Nutritional status Physical activity Advanced directives List of other physicians Hospitalizations, surgeries, and ER  visits in previous 12 months Vitals Screenings to include cognitive, depression, and falls Referrals and appointments  In addition, I have reviewed and discussed with patient certain preventive protocols, quality metrics, and best practice recommendations. A written personalized care plan for preventive services as well as general preventive health recommendations were provided to patient via mychart.     Varney Biles, LPN   [id number]

## 2020-11-16 NOTE — Patient Instructions (Addendum)
Sabrina Wilson , Thank you for taking time to come for your Medicare Wellness Visit. I appreciate your ongoing commitment to your health goals. Please review the following plan we discussed and let me know if I can assist you in the future.   These are the goals we discussed:  Goals       Patient Stated     Maintain Healthy Lifestyle (pt-stated)      Stay active Stay hydrated Healthy diet        This is a list of the screening recommended for you and due dates:  Health Maintenance  Topic Date Due   Tetanus Vaccine  03/26/2019   Flu Shot  11/23/2020   COVID-19 Vaccine (5 - Booster for Moderna series) 12/11/2020   DEXA scan (bone density measurement)  Completed   Hepatitis C Screening: USPSTF Recommendation to screen - Ages 29-79 yo.  Completed   Pneumonia vaccines  Completed   Zoster (Shingles) Vaccine  Completed   HPV Vaccine  Aged Out     Advanced directives: End of life planning; Advance aging; Advanced directives discussed.  Copy of current HCPOA/Living Will requested.    Conditions/risks identified: none new  Follow up in one year for your annual wellness visit    Preventive Care 65 Years and Older, Female Preventive care refers to lifestyle choices and visits with your health care provider that can promote health and wellness. What does preventive care include? A yearly physical exam. This is also called an annual well check. Dental exams once or twice a year. Routine eye exams. Ask your health care provider how often you should have your eyes checked. Personal lifestyle choices, including: Daily care of your teeth and gums. Regular physical activity. Eating a healthy diet. Avoiding tobacco and drug use. Limiting alcohol use. Practicing safe sex. Taking low-dose aspirin every day. Taking vitamin and mineral supplements as recommended by your health care provider. What happens during an annual well check? The services and screenings done by your health care  provider during your annual well check will depend on your age, overall health, lifestyle risk factors, and family history of disease. Counseling  Your health care provider may ask you questions about your: Alcohol use. Tobacco use. Drug use. Emotional well-being. Home and relationship well-being. Sexual activity. Eating habits. History of falls. Memory and ability to understand (cognition). Work and work Statistician. Reproductive health. Screening  You may have the following tests or measurements: Height, weight, and BMI. Blood pressure. Lipid and cholesterol levels. These may be checked every 5 years, or more frequently if you are over 2 years old. Skin check. Lung cancer screening. You may have this screening every year starting at age 84 if you have a 30-pack-year history of smoking and currently smoke or have quit within the past 15 years. Fecal occult blood test (FOBT) of the stool. You may have this test every year starting at age 46. Flexible sigmoidoscopy or colonoscopy. You may have a sigmoidoscopy every 5 years or a colonoscopy every 10 years starting at age 42. Hepatitis C blood test. Hepatitis B blood test. Sexually transmitted disease (STD) testing. Diabetes screening. This is done by checking your blood sugar (glucose) after you have not eaten for a while (fasting). You may have this done every 1-3 years. Bone density scan. This is done to screen for osteoporosis. You may have this done starting at age 14. Mammogram. This may be done every 1-2 years. Talk to your health care provider about how often  you should have regular mammograms. Talk with your health care provider about your test results, treatment options, and if necessary, the need for more tests. Vaccines  Your health care provider may recommend certain vaccines, such as: Influenza vaccine. This is recommended every year. Tetanus, diphtheria, and acellular pertussis (Tdap, Td) vaccine. You may need a Td  booster every 10 years. Zoster vaccine. You may need this after age 15. Pneumococcal 13-valent conjugate (PCV13) vaccine. One dose is recommended after age 18. Pneumococcal polysaccharide (PPSV23) vaccine. One dose is recommended after age 32. Talk to your health care provider about which screenings and vaccines you need and how often you need them. This information is not intended to replace advice given to you by your health care provider. Make sure you discuss any questions you have with your health care provider. Document Released: 05/08/2015 Document Revised: 12/30/2015 Document Reviewed: 02/10/2015 Elsevier Interactive Patient Education  2017 [place] [employer] Prevention in the Home Falls can cause injuries. They can happen to people of all ages. There are many things you can do to make your home safe and to help prevent falls. What can I do on the outside of my home? Regularly fix the edges of walkways and driveways and fix any cracks. Remove anything that might make you trip as you walk through a door, such as a raised step or threshold. Trim any bushes or trees on the path to your home. Use bright outdoor lighting. Clear any walking paths of anything that might make someone trip, such as rocks or tools. Regularly check to see if handrails are loose or broken. Make sure that both sides of any steps have handrails. Any raised decks and porches should have guardrails on the edges. Have any leaves, snow, or ice cleared regularly. Use sand or salt on walking paths during winter. Clean up any spills in your garage right away. This includes oil or grease spills. What can I do in the bathroom? Use night lights. Install grab bars by the toilet and in the tub and shower. Do not use towel bars as grab bars. Use non-skid mats or decals in the tub or shower. If you need to sit down in the shower, use a plastic, non-slip stool. Keep the floor dry. Clean up any water that spills on the floor  as soon as it happens. Remove soap buildup in the tub or shower regularly. Attach bath mats securely with double-sided non-slip rug tape. Do not have throw rugs and other things on the floor that can make you trip. What can I do in the bedroom? Use night lights. Make sure that you have a light by your bed that is easy to reach. Do not use any sheets or blankets that are too big for your bed. They should not hang down onto the floor. Have a firm chair that has side arms. You can use this for support while you get dressed. Do not have throw rugs and other things on the floor that can make you trip. What can I do in the kitchen? Clean up any spills right away. Avoid walking on wet floors. Keep items that you use a lot in easy-to-reach places. If you need to reach something above you, use a strong step stool that has a grab bar. Keep electrical cords out of the way. Do not use floor polish or wax that makes floors slippery. If you must use wax, use non-skid floor wax. Do not have throw rugs and other things on  the floor that can make you trip. What can I do with my stairs? Do not leave any items on the stairs. Make sure that there are handrails on both sides of the stairs and use them. Fix handrails that are broken or loose. Make sure that handrails are as long as the stairways. Check any carpeting to make sure that it is firmly attached to the stairs. Fix any carpet that is loose or worn. Avoid having throw rugs at the top or bottom of the stairs. If you do have throw rugs, attach them to the floor with carpet tape. Make sure that you have a light switch at the top of the stairs and the bottom of the stairs. If you do not have them, ask someone to add them for you. What else can I do to help prevent falls? Wear shoes that: Do not have high heels. Have rubber bottoms. Are comfortable and fit you well. Are closed at the toe. Do not wear sandals. If you use a stepladder: Make sure that it is  fully opened. Do not climb a closed stepladder. Make sure that both sides of the stepladder are locked into place. Ask someone to hold it for you, if possible. Clearly mark and make sure that you can see: Any grab bars or handrails. First and last steps. Where the edge of each step is. Use tools that help you move around (mobility aids) if they are needed. These include: Canes. Walkers. Scooters. Crutches. Turn on the lights when you go into a dark area. Replace any light bulbs as soon as they burn out. Set up your furniture so you have a clear path. Avoid moving your furniture around. If any of your floors are uneven, fix them. If there are any pets around you, be aware of where they are. Review your medicines with your doctor. Some medicines can make you feel dizzy. This can increase your chance of falling. Ask your doctor what other things that you can do to help prevent falls. This information is not intended to replace advice given to you by your health care provider. Make sure you discuss any questions you have with your health care provider. Document Released: 02/05/2009 Document Revised: 09/17/2015 Document Reviewed: 05/16/2014 Elsevier Interactive Patient Education  2017 [employer].

## 2020-11-17 ENCOUNTER — Ambulatory Visit (INDEPENDENT_AMBULATORY_CARE_PROVIDER_SITE_OTHER): Payer: Medicare Other | Admitting: Family Medicine

## 2020-11-17 ENCOUNTER — Other Ambulatory Visit: Payer: Self-pay

## 2020-11-17 DIAGNOSIS — C4492 Squamous cell carcinoma of skin, unspecified: Secondary | ICD-10-CM | POA: Diagnosis not present

## 2020-11-17 DIAGNOSIS — I10 Essential (primary) hypertension: Secondary | ICD-10-CM | POA: Diagnosis not present

## 2020-11-17 DIAGNOSIS — K219 Gastro-esophageal reflux disease without esophagitis: Secondary | ICD-10-CM | POA: Diagnosis not present

## 2020-11-17 DIAGNOSIS — E785 Hyperlipidemia, unspecified: Secondary | ICD-10-CM | POA: Diagnosis not present

## 2020-11-17 NOTE — Assessment & Plan Note (Signed)
No recent symptoms.  She will monitor for recurrence.

## 2020-11-17 NOTE — Assessment & Plan Note (Signed)
Acceptable control for her age.  She will continue HCTZ 12.5 mg daily.  If her blood pressure starts to trend up she will let us know.

## 2020-11-17 NOTE — Assessment & Plan Note (Signed)
She will continue Lipitor 10 mg once daily.  Plan for lipid panel at her next visit.

## 2020-11-17 NOTE — Patient Instructions (Signed)
Nice to see you. Please keep an eye on your blood pressure. If it trends up please let us know.

## 2020-11-17 NOTE — Progress Notes (Signed)
Tommi Rumps, MD Phone: [phone]  Sabrina Wilson is a 80 y.o. female who presents today for f/u  HYPERTENSION Disease Monitoring Home BP Monitoring typically in the [id number] systolic though occasionally in to the low 130s Chest pain- no    Dyspnea- no Medications Compliance-  taking HCTZ 12.5 mg daily. Lightheadedness-  no  Edema- no  HYPERLIPIDEMIA Symptoms Chest pain on exertion:  no   Medications: Compliance- taking lipitor Right upper quadrant pain- no  Muscle aches- no  GERD: Notes no GERD symptoms.  Squamous cell carcinoma of the skin: She had this removed from her left ankle.  She continues to follow with dermatology for this.   Social History   Tobacco Use  Smoking Status Former   Types: Cigarettes   Quit date: 10/12/2007   Years since quitting: 13.1  Smokeless Tobacco Never    Current Outpatient Medications on File Prior to Visit  Medication Sig Dispense Refill   atorvastatin (LIPITOR) 10 MG tablet TAKE ONE TABLET BY MOUTH DAILY 90 tablet 1   azelastine (ASTELIN) 0.1 % nasal spray Place 2 sprays into both nostrils 2 (two) times daily. Use in each nostril as directed 30 mL 12   Calcium Carbonate-Vit D-Min (CALCIUM 1200 PO) Take by mouth daily.     hydrochlorothiazide (HYDRODIURIL) 12.5 MG tablet Take 1 tablet (12.5 mg total) by mouth daily. 90 tablet 1   mupirocin ointment (BACTROBAN) 2 % Apply 1 application topically 2 (two) times daily. 22 g 0   niacinamide 500 MG tablet Take 500 mg by mouth 2 (two) times daily.     Omega-3 Fatty Acids (FISH OIL PO) Take by mouth daily.     VITAMIN D, CHOLECALCIFEROL, PO Take 2,000 Units by mouth daily.     No current facility-administered medications on file prior to visit.     ROS see history of present illness  Objective  Physical Exam Vitals:   11/17/20 1244  BP: 140/68  Pulse: 81  Resp: 16  Temp: 98.1 F (36.7 C)  SpO2: 97%    BP Readings from Last 3 Encounters:  11/17/20 140/68  11/16/20 127/81   09/29/20 (!) 160/84   Wt Readings from Last 3 Encounters:  11/17/20 189 lb 2 oz (85.8 kg)  11/16/20 188 lb (85.3 kg)  09/29/20 188 lb (85.3 kg)    Physical Exam Constitutional:      General: She is not in acute distress.    Appearance: She is not diaphoretic.  Cardiovascular:     Rate and Rhythm: Normal rate and regular rhythm.     Heart sounds: Normal heart sounds.  Pulmonary:     Effort: Pulmonary effort is normal.     Breath sounds: Normal breath sounds.  Musculoskeletal:     Right lower leg: No edema.     Left lower leg: No edema.  Skin:    General: Skin is warm and dry.  Neurological:     Mental Status: She is alert.     Assessment/Plan: Please see individual problem list.  Problem List Items Addressed This Visit     GERD (gastroesophageal reflux disease)    No recent symptoms.  She will monitor for recurrence.       Hyperlipidemia    She will continue Lipitor 10 mg once daily.  Plan for lipid panel at her next visit.       Hypertension    Acceptable control for her age.  She will continue HCTZ 12.5 mg daily.  If her blood pressure  starts to trend up she will let us know.       Squamous cell carcinoma of skin    She will continue to follow with dermatology for this.        Return in about 6 months (around 05/20/2021).  This visit occurred during the SARS-CoV-2 public health emergency.  Safety protocols were in place, including screening questions prior to the visit, additional usage of staff PPE, and extensive cleaning of exam room while observing appropriate contact time as indicated for disinfecting solutions.     Tommi Rumps, MD [place]

## 2020-11-17 NOTE — Assessment & Plan Note (Signed)
She will continue to follow with dermatology for this.

## 2020-12-02 ENCOUNTER — Encounter: Payer: Medicare Other | Admitting: Dermatology

## 2021-01-12 ENCOUNTER — Other Ambulatory Visit: Payer: Self-pay | Admitting: Family Medicine

## 2021-04-01 ENCOUNTER — Ambulatory Visit: Payer: Medicare Other | Admitting: Dermatology

## 2021-04-01 ENCOUNTER — Other Ambulatory Visit: Payer: Self-pay

## 2021-04-01 DIAGNOSIS — L300 Nummular dermatitis: Secondary | ICD-10-CM | POA: Diagnosis not present

## 2021-04-01 DIAGNOSIS — L821 Other seborrheic keratosis: Secondary | ICD-10-CM

## 2021-04-01 DIAGNOSIS — L219 Seborrheic dermatitis, unspecified: Secondary | ICD-10-CM | POA: Diagnosis not present

## 2021-04-01 DIAGNOSIS — Z1283 Encounter for screening for malignant neoplasm of skin: Secondary | ICD-10-CM | POA: Diagnosis not present

## 2021-04-01 DIAGNOSIS — D1801 Hemangioma of skin and subcutaneous tissue: Secondary | ICD-10-CM

## 2021-04-01 DIAGNOSIS — L820 Inflamed seborrheic keratosis: Secondary | ICD-10-CM

## 2021-04-01 DIAGNOSIS — Z872 Personal history of diseases of the skin and subcutaneous tissue: Secondary | ICD-10-CM

## 2021-04-01 DIAGNOSIS — Z85828 Personal history of other malignant neoplasm of skin: Secondary | ICD-10-CM

## 2021-04-01 DIAGNOSIS — L814 Other melanin hyperpigmentation: Secondary | ICD-10-CM

## 2021-04-01 DIAGNOSIS — L578 Other skin changes due to chronic exposure to nonionizing radiation: Secondary | ICD-10-CM

## 2021-04-01 DIAGNOSIS — D229 Melanocytic nevi, unspecified: Secondary | ICD-10-CM

## 2021-04-01 MED ORDER — CLOBETASOL PROPIONATE 0.05 % EX SOLN: CUTANEOUS | 2 refills | Status: DC

## 2021-04-01 MED ORDER — KETOCONAZOLE 2 % EX SHAM: MEDICATED_SHAMPOO | CUTANEOUS | 5 refills | Status: DC

## 2021-04-01 MED ORDER — CLOBETASOL PROPIONATE 0.05 % EX OINT: 1.0000 "application " | TOPICAL_OINTMENT | Freq: Two times a day (BID) | CUTANEOUS | 1 refills | Status: DC

## 2021-04-01 NOTE — Patient Instructions (Addendum)
Cryotherapy Aftercare  Wash gently with soap and water everyday.   Apply Vaseline and Band-Aid daily until healed.   Prior to procedure, discussed risks of blister formation, small wound, skin dyspigmentation, or rare scar following cryotherapy. Recommend Vaseline ointment to treated areas while healing.  Start clobetasol 0.05% ointment twice daily for up to 2-3 weeks. Avoid applying to face, groin, and axilla. Use as directed. Long-term use can cause thinning of the skin.  Topical steroids (such as triamcinolone, fluocinolone, fluocinonide, mometasone, clobetasol, halobetasol, betamethasone, hydrocortisone) can cause thinning and lightening of the skin if they are used for too long in the same area. Your physician has selected the right strength medicine for your problem and area affected on the body. Please use your medication only as directed by your physician to prevent side effects.   Start ketoconazole 2% shampoo apply three times per week, massage into scalp and leave in for 10 minutes before rinsing out  Start clobetasol 0.05% solution daily to scalp as needed for itch. Avoid applying to face, groin, and axilla. Use as directed. Long-term use can cause thinning of the skin.  Melanoma ABCDEs  Melanoma is the most dangerous type of skin cancer, and is the leading cause of death from skin disease.  You are more likely to develop melanoma if you: Have light-colored skin, light-colored eyes, or red or blond hair Spend a lot of time in the sun Tan regularly, either outdoors or in a tanning bed Have had blistering sunburns, especially during childhood Have a close family member who has had a melanoma Have atypical moles or large birthmarks  Early detection of melanoma is key since treatment is typically straightforward and cure rates are extremely high if we catch it early.   The first sign of melanoma is often a change in a mole or a new dark spot.  The ABCDE system is a way of  remembering the signs of melanoma.  A for asymmetry:  The two halves do not match. B for border:  The edges of the growth are irregular. C for color:  A mixture of colors are present instead of an even brown color. D for diameter:  Melanomas are usually (but not always) greater than 26mm - the size of a pencil eraser. E for evolution:  The spot keeps changing in size, shape, and color.  Please check your skin once per month between visits. You can use a small mirror in front and a large mirror behind you to keep an eye on the back side or your body.   If you see any new or changing lesions before your next follow-up, please call to schedule a visit.  Please continue daily skin protection including broad spectrum sunscreen SPF 30+ to sun-exposed areas, reapplying every 2 hours as needed when you're outdoors.    If You Need Anything After Your Visit  If you have any questions or concerns for your doctor, please call our main line at [phone] and press option 4 to reach your doctor's medical assistant. If no one answers, please leave a voicemail as directed and we will return your call as soon as possible. Messages left after 4 pm will be answered the following business day.   You may also send [place] a message via [facility]. We typically respond to MyChart messages within 1-2 business days.  For prescription refills, please ask your pharmacy to contact our office. Our fax number is [phone].  If you have an urgent issue when the clinic is  closed that cannot wait until the next business day, you can page your doctor at the number below.    Please note that while we do our best to be available for urgent issues outside of office hours, we are not available 24/7.   If you have an urgent issue and are unable to reach [place], you may choose to seek medical care at your doctor's office, retail clinic, urgent care center, or emergency room.  If you have a medical emergency, please immediately call  911 or go to the emergency department.  Pager Numbers  - Dr. Nehemiah Massed: [phone]  - Dr. Laurence Ferrari: [phone]  - Dr. Nicole Kindred: [phone]  In the event of inclement weather, please call our main line at [phone] for an update on the status of any delays or closures.  Dermatology Medication Tips: Please keep the boxes that topical medications come in in order to help keep track of the instructions about where and how to use these. Pharmacies typically print the medication instructions only on the boxes and not directly on the medication tubes.   If your medication is too expensive, please contact our office at [phone] option 4 or send [place] a message through [place].   We are unable to tell what your co-pay for medications will be in advance as this is different depending on your insurance coverage. However, we may be able to find a substitute medication at lower cost or fill out paperwork to get insurance to cover a needed medication.   If a prior authorization is required to get your medication covered by your insurance company, please allow [place] 1-2 business days to complete this process.  Drug prices often vary depending on where the prescription is filled and some pharmacies may offer cheaper prices.  The website www.goodrx.com contains coupons for medications through different pharmacies. The prices here do not account for what the cost may be with help from insurance (it may be cheaper with your insurance), but the website can give you the price if you did not use any insurance.  - You can print the associated coupon and take it with your prescription to the pharmacy.  - You may also stop by our office during regular business hours and pick up a GoodRx coupon card.  - If you need your prescription sent electronically to a different pharmacy, notify our office through [facility] or by phone at [phone] option 4.     Si Usted Necesita Algo Despus de Su  Visita  Tambin puede enviarnos un mensaje a travs de Pharmacist, community. Por lo general respondemos a los mensajes de MyChart en el transcurso de 1 a 2 das hbiles.  Para renovar recetas, por favor pida a su farmacia que se ponga en contacto con nuestra oficina. Harland Dingwall de fax es [place] [phone].  Si tiene un asunto urgente cuando la clnica est cerrada y que no puede esperar hasta el siguiente da hbil, puede llamar/localizar a su doctor(a) al nmero que aparece a continuacin.   Por favor, tenga en cuenta que aunque hacemos todo lo posible para estar disponibles para asuntos urgentes fuera del horario de [place], no estamos disponibles las 24 horas del da, los 7 das de la [place].   Si tiene un problema urgente y no puede comunicarse con nosotros, puede optar por buscar atencin mdica  en el consultorio de su doctor(a), en una clnica privada, en un centro de atencin urgente o en una sala de emergencias.  Si tiene [employer],  por favor llame inmediatamente al 911 o vaya a la sala de emergencias.  Nmeros de bper  - Dr. Nehemiah Massed: [phone]  - Dra. Moye: [phone]  - Dra. Nicole Kindred: [phone]  En caso de inclemencias del [place], por favor llame a Johnsie Kindred principal al [phone] para una actualizacin sobre el [place] de cualquier retraso o cierre.  Consejos para la medicacin en dermatologa: Por favor, guarde las cajas en las que vienen los medicamentos de uso tpico para ayudarle a seguir las instrucciones sobre dnde y cmo usarlos. Las farmacias generalmente imprimen las instrucciones del medicamento slo en las cajas y no directamente en los tubos del [place].   Si su medicamento es muy caro, por favor, pngase en contacto con Zigmund Daniel llamando al [phone] y presione la opcin 4 o envenos un mensaje a travs de Pharmacist, community.   No podemos decirle cul ser su copago por los medicamentos por adelantado ya que esto es diferente dependiendo de la  cobertura de su seguro. Sin embargo, es posible que podamos encontrar un medicamento sustituto a Electrical engineer un formulario para que el seguro cubra el medicamento que se considera necesario.   Si se requiere una autorizacin previa para que su compaa de seguros [place] su medicamento, por favor permtanos de 1 a 2 das hbiles para completar este proceso.  Los precios de los medicamentos varan con frecuencia dependiendo del Environmental consultant de dnde se surte la receta y alguna farmacias pueden ofrecer precios ms baratos.  El sitio web www.goodrx.com tiene cupones para medicamentos de Airline pilot. Los precios aqu no tienen en cuenta lo que podra costar con la ayuda del seguro (puede ser ms barato con su seguro), pero el sitio web puede darle el precio si no utiliz Research scientist (physical sciences).  - Puede imprimir el cupn correspondiente y llevarlo con su receta a la farmacia.  - Tambin puede pasar por nuestra oficina durante el horario de atencin regular y Charity fundraiser una tarjeta de cupones de GoodRx.  - Si necesita que su receta se enve electrnicamente a una farmacia diferente, informe a nuestra oficina a travs de MyChart de [place] o por telfono llamando al [phone] y presione la opcin 4.

## 2021-04-01 NOTE — Progress Notes (Signed)
Follow-Up Visit   Subjective  Sabrina Wilson is a 80 y.o. female who presents for the following: FBSE (Patient here for full body skin exam and skin cancer screening. Patient with a hx of SCC and AK's. Patient has itchy spots at right lower leg, left arm she would like checked. ).  The following portions of the chart were reviewed this encounter and updated as appropriate:   Tobacco  Allergies  Meds  Problems  Med Hx  Surg Hx  Fam Hx      Review of Systems:  No other skin or systemic complaints except as noted in HPI or Assessment and Plan.  Objective  Well appearing patient in no apparent distress; mood and affect are within normal limits.  A full examination was performed including scalp, head, eyes, ears, nose, lips, neck, chest, axillae, abdomen, back, buttocks, bilateral upper extremities, bilateral lower extremities, hands, feet, fingers, toes, fingernails, and toenails. All findings within normal limits unless otherwise noted below.  Left Forearm x 2, right upper arm x 1, right low abdomen x 1, left buttock x 1, left chest x 1, right lateral eyebrow x 1, right zygoma x 1 (8) Erythematous keratotic or waxy stuck-on papule or plaque.   bilateral lower legs Scaly pink well demarcated plaques  Scalp Scaly pink plaques at scalp   Assessment & Plan  Inflamed seborrheic keratosis Left Forearm x 2, right upper arm x 1, right low abdomen x 1, left buttock x 1, left chest x 1, right lateral eyebrow x 1, right zygoma x 1  Symptomatic  Prior to procedure, discussed risks of blister formation, small wound, skin dyspigmentation, or rare scar following cryotherapy. Recommend Vaseline ointment to treated areas while healing.  Recheck left buttock on follow up.    Destruction of lesion - Left Forearm x 2, right upper arm x 1, right low abdomen x 1, left buttock x 1, left chest x 1, right lateral eyebrow x 1, right zygoma x 1  Destruction method: cryotherapy   Informed  consent: discussed and consent obtained   Lesion destroyed using liquid nitrogen: Yes   Cryotherapy cycles:  2 Outcome: patient tolerated procedure well with no complications   Post-procedure details: wound care instructions given    Nummular dermatitis bilateral lower legs  Favored over psoriasis.   No joint pain.   Start clobetasol 0.05% ointment twice daily for up to 2-3 weeks. Avoid applying to face, groin, and axilla. Use as directed. Long-term use can cause thinning of the skin.  Topical steroids (such as triamcinolone, fluocinolone, fluocinonide, mometasone, clobetasol, halobetasol, betamethasone, hydrocortisone) can cause thinning and lightening of the skin if they are used for too long in the same area. Your physician has selected the right strength medicine for your problem and area affected on the body. Please use your medication only as directed by your physician to prevent side effects.    clobetasol ointment (TEMOVATE) 0.05 % - bilateral lower legs Apply 1 application topically 2 (two) times daily. To affected areas of rash for up to 2-3 weeks. Avoid applying to face, groin, and axilla. Use as directed. Long-term use can cause thinning of the skin.  Seborrheic dermatitis Scalp  Chronic condition with duration or expected duration over one year. Condition is bothersome to patient. Currently flared.  Seborrheic Dermatitis  -  is a chronic persistent rash characterized by pinkness and scaling most commonly of the mid face but also can occur on the scalp (dandruff), ears; mid chest, mid back  and groin.  It tends to be exacerbated by stress and cooler weather.  People who have neurologic disease may experience new onset or exacerbation of existing seborrheic dermatitis.  The condition is not curable but treatable and can be controlled.  Start ketoconazole 2% shampoo apply three times per week, massage into scalp and leave in for 10 minutes before rinsing out  Start clobetasol  0.05% solution daily to scalp as needed for itch  ketoconazole (NIZORAL) 2 % shampoo - Scalp apply three times per week, massage into scalp and leave in for 10 minutes before rinsing out  clobetasol (TEMOVATE) 0.05 % external solution - Scalp Apply to scalp once daily as needed for itch. Avoid applying to face, groin, and axilla. Use as directed. Long-term use can cause thinning of the skin.  Lentigines - Scattered tan macules - Due to sun exposure - Benign-appearing, observe - Recommend daily broad spectrum sunscreen SPF 30+ to sun-exposed areas, reapply every 2 hours as needed. - Call for any changes  Seborrheic Keratoses - Stuck-on, waxy, tan-brown papules and/or plaques  - Benign-appearing - Discussed benign etiology and prognosis. - Observe - Call for any changes  Melanocytic Nevi - Tan-brown and/or pink-flesh-colored symmetric macules and papules - Benign appearing on exam today - Observation - Call clinic for new or changing moles - Recommend daily use of broad spectrum spf 30+ sunscreen to sun-exposed areas.   Hemangiomas - Red papules - Discussed benign nature - Observe - Call for any changes  Actinic Damage - Chronic condition, secondary to cumulative UV/sun exposure - diffuse scaly erythematous macules with underlying dyspigmentation - Recommend daily broad spectrum sunscreen SPF 30+ to sun-exposed areas, reapply every 2 hours as needed.  - Staying in the shade or wearing long sleeves, sun glasses (UVA+UVB protection) and wide brim hats (4-inch brim around the entire circumference of the hat) are also recommended for sun protection.  - Call for new or changing lesions.  Skin cancer screening performed today.  History of Squamous Cell Carcinoma of the Skin - No evidence of recurrence today - No lymphadenopathy - Recommend regular full body skin exams - Recommend daily broad spectrum sunscreen SPF 30+ to sun-exposed areas, reapply every 2 hours as needed.  -  Call if any new or changing lesions are noted between office visits  History of PreCancerous Actinic Keratosis  - site(s) of PreCancerous Actinic Keratosis clear today. - these may recur and new lesions may form requiring treatment to prevent transformation into skin cancer - observe for new or changing spots and contact [place] for appointment if occur - photoprotection with sun protective clothing; sunglasses and broad spectrum sunscreen with SPF of at least 30 + and frequent self skin exams recommended - yearly exams by a dermatologist recommended for persons with history of PreCancerous Actinic Keratoses  Return in about 1 year (around 04/01/2022) for TBSE, 4-8 weeks recheck eczema.  Graciella Belton, RMA, am acting as scribe for Forest Gleason, MD .  Documentation: I have reviewed the above documentation for accuracy and completeness, and I agree with the above.  Forest Gleason, MD

## 2021-04-08 ENCOUNTER — Encounter: Payer: Self-pay | Admitting: Dermatology

## 2021-04-15 ENCOUNTER — Other Ambulatory Visit: Payer: Self-pay | Admitting: Family Medicine

## 2021-04-15 DIAGNOSIS — E785 Hyperlipidemia, unspecified: Secondary | ICD-10-CM

## 2021-04-25 DIAGNOSIS — Z923 Personal history of irradiation: Secondary | ICD-10-CM

## 2021-04-25 HISTORY — DX: Personal history of irradiation: Z92.3

## 2021-05-24 ENCOUNTER — Ambulatory Visit (INDEPENDENT_AMBULATORY_CARE_PROVIDER_SITE_OTHER): Payer: Medicare Other

## 2021-05-24 ENCOUNTER — Encounter: Payer: Self-pay | Admitting: Family Medicine

## 2021-05-24 ENCOUNTER — Other Ambulatory Visit: Payer: Self-pay

## 2021-05-24 ENCOUNTER — Ambulatory Visit (INDEPENDENT_AMBULATORY_CARE_PROVIDER_SITE_OTHER): Payer: Medicare Other | Admitting: Family Medicine

## 2021-05-24 VITALS — BP 128/78 | HR 78 | Temp 98.1°F | Ht 66.0 in | Wt 175.0 lb

## 2021-05-24 DIAGNOSIS — G629 Polyneuropathy, unspecified: Secondary | ICD-10-CM

## 2021-05-24 DIAGNOSIS — G8929 Other chronic pain: Secondary | ICD-10-CM

## 2021-05-24 DIAGNOSIS — M545 Low back pain, unspecified: Secondary | ICD-10-CM

## 2021-05-24 DIAGNOSIS — E785 Hyperlipidemia, unspecified: Secondary | ICD-10-CM | POA: Diagnosis not present

## 2021-05-24 DIAGNOSIS — R7303 Prediabetes: Secondary | ICD-10-CM

## 2021-05-24 DIAGNOSIS — I10 Essential (primary) hypertension: Secondary | ICD-10-CM | POA: Diagnosis not present

## 2021-05-24 NOTE — Assessment & Plan Note (Signed)
Chronic ongoing issue.  Likely related to arthritis or degenerative disc issues.  No prior lumbar spine imaging.  Discussed getting an x-ray today to evaluate for underlying cause.

## 2021-05-24 NOTE — Assessment & Plan Note (Signed)
Well-controlled.  She will continue HCTZ 12.5 mg once daily.

## 2021-05-24 NOTE — Assessment & Plan Note (Signed)
Check A1c.  Encouraged staying active and keeping up with healthy diet.

## 2021-05-24 NOTE — Assessment & Plan Note (Signed)
Chronic and stable related to prior chemotherapy.  She will monitor given the lack of benefit from prior medication trials.

## 2021-05-24 NOTE — Progress Notes (Signed)
Tommi Rumps, MD Phone: [phone]  Sabrina Wilson is a 81 y.o. female who presents today for f/u.  HYPERTENSION Disease Monitoring Home BP Monitoring 120s/60s Chest pain- no    Dyspnea- no Medications Compliance-  taking HCTZ.   Edema- no BMET    Component Value Date/Time   NA 137 08/18/2020 1247   NA 133 (L) 07/15/2012 2105   K 4.2 08/18/2020 1247   K 3.8 07/18/2012 0503   CL 100 08/18/2020 1247   CL 100 07/15/2012 2105   CO2 30 08/18/2020 1247   CO2 28 07/15/2012 2105   GLUCOSE 98 08/18/2020 1247   GLUCOSE 136 (H) 07/15/2012 2105   BUN 24 (H) 08/18/2020 1247   BUN 10 07/15/2012 2105   CREATININE 0.91 08/18/2020 1247   CREATININE 0.68 07/15/2012 2105   CALCIUM 10.4 08/18/2020 1247   CALCIUM 8.4 (L) 07/15/2012 2105   GFRNONAA 52 (L) 06/19/2017 1429   GFRNONAA >60 07/15/2012 2105   GFRAA 60 (L) 06/19/2017 1429   GFRAA >60 07/15/2012 2105   HYPERLIPIDEMIA Symptoms Chest pain on exertion:  no   Medications: Compliance- taking lipitor Right upper quadrant pain- no  Muscle aches- no Lipid Panel     Component Value Date/Time   CHOL 205 (H) 11/26/2019 1204   TRIG 58.0 11/26/2019 1204   HDL 111.80 11/26/2019 1204   CHOLHDL 2 11/26/2019 1204   VLDL 11.6 11/26/2019 1204   LDLCALC 81 11/26/2019 1204   LDLDIRECT 66.0 09/08/2017 0925   Prediabetes: No [place] area or polydipsia.  She tries to limit her sweets.  No soda or sweet tea.  She is now just drinking water.  She tries to stay active.  Typically has a cereal and banana for breakfast.  Has lots of vegetable soup and salads.  Neuropathy: This is a chronic issue related to chemotherapy for colon cancer.  She notes that she tried gabapentin and Lyrica in the past with no benefit.  Chronic low back pain: This has been going on for years.  It has gotten she is aged.  Seems to bother her more when she packings.  No radiation.  Occasionally her right leg will go numb but that has not happened in a long time.  Social  History   Tobacco Use  Smoking Status Former   Types: Cigarettes   Quit date: 10/12/2007   Years since quitting: 13.6  Smokeless Tobacco Never    Current Outpatient Medications on File Prior to Visit  Medication Sig Dispense Refill   atorvastatin (LIPITOR) 10 MG tablet TAKE ONE TABLET BY MOUTH DAILY 90 tablet 1   azelastine (ASTELIN) 0.1 % nasal spray Place 2 sprays into both nostrils 2 (two) times daily. Use in each nostril as directed 30 mL 12   Calcium Carbonate-Vit D-Min (CALCIUM 1200 PO) Take by mouth daily.     clobetasol (TEMOVATE) 0.05 % external solution Apply to scalp once daily as needed for itch. Avoid applying to face, groin, and axilla. Use as directed. Long-term use can cause thinning of the skin. 50 mL 2   clobetasol ointment (TEMOVATE) 3.08 % Apply 1 application topically 2 (two) times daily. To affected areas of rash for up to 2-3 weeks. Avoid applying to face, groin, and axilla. Use as directed. Long-term use can cause thinning of the skin. 45 g 1   hydrochlorothiazide (HYDRODIURIL) 12.5 MG tablet TAKE 1 TABLET BY MOUTH DAILY. 90 tablet 1   ketoconazole (NIZORAL) 2 % shampoo apply three times per week, massage into scalp  and leave in for 10 minutes before rinsing out 120 mL 5   mupirocin ointment (BACTROBAN) 2 % Apply 1 application topically 2 (two) times daily. 22 g 0   niacinamide 500 MG tablet Take 500 mg by mouth 2 (two) times daily.     Omega-3 Fatty Acids (FISH OIL PO) Take by mouth daily.     sodium fluoride (FLUORISHIELD) 1.1 % GEL dental gel Place onto teeth.     VITAMIN D, CHOLECALCIFEROL, PO Take 2,000 Units by mouth daily.     No current facility-administered medications on file prior to visit.     ROS see history of present illness  Objective  Physical Exam Vitals:   05/24/21 1425 05/24/21 1437  BP: (!) 150/80 128/78  Pulse: 78   Temp: 98.1 F (36.7 C)   SpO2: 98%     BP Readings from Last 3 Encounters:  05/24/21 128/78  11/17/20 140/68   11/16/20 127/81   Wt Readings from Last 3 Encounters:  05/24/21 175 lb (79.4 kg)  11/17/20 189 lb 2 oz (85.8 kg)  11/16/20 188 lb (85.3 kg)    Physical Exam Constitutional:      General: She is not in acute distress.    Appearance: She is not diaphoretic.  Cardiovascular:     Rate and Rhythm: Normal rate and regular rhythm.     Heart sounds: Normal heart sounds.  Pulmonary:     Effort: Pulmonary effort is normal.     Breath sounds: Normal breath sounds.  Musculoskeletal:     Comments: No midline spine tenderness, midline spine step-off, no muscular back tenderness  Skin:    General: Skin is warm and dry.  Neurological:     Mental Status: She is alert.     Comments: 5/5 strength bilateral quads, hamstrings, plantar flexion, and dorsiflexion, sensation to light touch slightly diminished below her knees     Assessment/Plan: Please see individual problem list.  Problem List Items Addressed This Visit     Chronic bilateral low back pain without sciatica    Chronic ongoing issue.  Likely related to arthritis or degenerative disc issues.  No prior lumbar spine imaging.  Discussed getting an x-ray today to evaluate for underlying cause.      Relevant Orders   DG Lumbar Spine Complete   Hyperlipidemia    Check lipid panel.  Continue Lipitor 10 mg once daily.      Relevant Orders   Comp Met (CMET)   Lipid panel   Hypertension - Primary    Well-controlled.  She will continue HCTZ 12.5 mg once daily.      Relevant Orders   Comp Met (CMET)   Neuropathy    Chronic and stable related to prior chemotherapy.  She will monitor given the lack of benefit from prior medication trials.      Prediabetes    Check A1c.  Encouraged staying active and keeping up with healthy diet.      Relevant Orders   HgB A1c     Health Maintenance: I asked the patient to check on the status of her tetanus vaccine with her pharmacy.  Return in about 6 months (around 11/21/2021) for  Hypertension follow-up.  This visit occurred during the SARS-CoV-2 public health emergency.  Safety protocols were in place, including screening questions prior to the visit, additional usage of staff PPE, and extensive cleaning of exam room while observing appropriate contact time as indicated for disinfecting solutions.    Tommi Rumps, MD [place]  [facility]

## 2021-05-24 NOTE — Assessment & Plan Note (Signed)
Check lipid panel.  Continue Lipitor 10 mg once daily.

## 2021-05-24 NOTE — Patient Instructions (Signed)
Nice to see you. We will get lab work today and an x-ray.  We will contact you with the results. Please continue with healthy diet and remain active.

## 2021-05-25 ENCOUNTER — Encounter: Payer: Self-pay | Admitting: Family Medicine

## 2021-05-25 LAB — COMPREHENSIVE METABOLIC PANEL
ALT: 11 U/L (ref 0–35)
AST: 17 U/L (ref 0–37)
Albumin: 4.3 g/dL (ref 3.5–5.2)
Alkaline Phosphatase: 49 U/L (ref 39–117)
BUN: 21 mg/dL (ref 6–23)
CO2: 32 mEq/L (ref 19–32)
Calcium: 10.4 mg/dL (ref 8.4–10.5)
Chloride: 98 mEq/L (ref 96–112)
Creatinine, Ser: 0.86 mg/dL (ref 0.40–1.20)
GFR: 64.75 mL/min (ref >60.00)
Glucose, Bld: 91 mg/dL (ref 70–99)
Potassium: 4 mEq/L (ref 3.5–5.1)
Sodium: 137 mEq/L (ref 135–145)
Total Bilirubin: 0.6 mg/dL (ref 0.2–1.2)
Total Protein: 6.8 g/dL (ref 6.0–8.3)

## 2021-05-25 LAB — LIPID PANEL
Cholesterol: 201 mg/dL — ABNORMAL HIGH (ref 0–200)
HDL: 110.7 mg/dL (ref >39.00)
LDL Cholesterol: 77 mg/dL (ref 0–99)
NonHDL: 90.17
Total CHOL/HDL Ratio: 2
Triglycerides: 65 mg/dL (ref 0.0–149.0)
VLDL: 13 mg/dL (ref 0.0–40.0)

## 2021-05-25 LAB — HEMOGLOBIN A1C: Hgb A1c MFr Bld: 6.2 % (ref 4.6–6.5)

## 2021-05-25 NOTE — Telephone Encounter (Signed)
Updated in chart

## 2021-06-23 ENCOUNTER — Ambulatory Visit: Payer: Medicare Other | Admitting: Dermatology

## 2021-06-23 ENCOUNTER — Encounter: Payer: Self-pay | Admitting: Dermatology

## 2021-06-23 ENCOUNTER — Other Ambulatory Visit: Payer: Self-pay

## 2021-06-23 DIAGNOSIS — B078 Other viral warts: Secondary | ICD-10-CM | POA: Diagnosis not present

## 2021-06-23 DIAGNOSIS — L300 Nummular dermatitis: Secondary | ICD-10-CM

## 2021-06-23 DIAGNOSIS — L820 Inflamed seborrheic keratosis: Secondary | ICD-10-CM | POA: Diagnosis not present

## 2021-06-23 MED ORDER — OPZELURA 1.5 % EX CREA: 1.0000 "application " | TOPICAL_CREAM | Freq: Every day | CUTANEOUS | 2 refills | Status: DC

## 2021-06-23 NOTE — Progress Notes (Signed)
? ?  Follow-Up Visit ?  ?Subjective  ?Sabrina Wilson is a 81 y.o. female who presents for the following: Rash (8 week recheck. B/L legs. Used Opzelura samples ~3 days prior to starting Clobetasol ointment as directed. Opzelura almost cleared the rash. Patient reports rash has resolved) and lesions (Check spots. Right sideburn area and back. Raised, rough, irritated, tender. Dur: 3-4 weeks). ? ?The patient has spots, moles and lesions to be evaluated, some may be new or changing and the patient has concerns that these could be cancer.  ? ?The following portions of the chart were reviewed this encounter and updated as appropriate:  Tobacco  Allergies  Meds  Problems  Med Hx  Surg Hx  Fam Hx   ?  ? ?Review of Systems: No other skin or systemic complaints except as noted in HPI or Assessment and Plan. ? ? ?Objective  ?Well appearing patient in no apparent distress; mood and affect are within normal limits. ? ?A focused examination was performed including face, back, legs. Relevant physical exam findings are noted in the Assessment and Plan. ? ?B/L legs ?Mild hyperpigmentation  ? ?Right Sideburn x1 ?Erythematous keratotic or waxy stuck-on papule or plaque. ? ?Left Mid Back x1 ?Verrucous papule -- Discussed viral etiology and contagion.  ? ? ?Assessment & Plan  ?Nummular dermatitis ?B/L legs ? ?Clear. Continue Opzelura once daily. Sent to [facility] ? ?Chronic condition with duration or expected duration over one year. Currently well-controlled. ? ? ? ?Ruxolitinib Phosphate (OPZELURA) 1.5 % CREA - B/L legs ?Apply 1 application topically daily. ? ?Related Medications ?clobetasol ointment (TEMOVATE) 0.05 % ?Apply 1 application topically 2 (two) times daily. To affected areas of rash for up to 2-3 weeks. Avoid applying to face, groin, and axilla. Use as directed. Long-term use can cause thinning of the skin. ? ?Inflamed seborrheic keratosis ?Right Sideburn x1 ? ?Symptomatic ? ?Prior to procedure, discussed risks of  blister formation, small wound, skin dyspigmentation, or rare scar following cryotherapy. Recommend Vaseline ointment to treated areas while healing. ? ? ?Destruction of lesion - Right Sideburn x1 ? ?Destruction method: cryotherapy   ?Informed consent: discussed and consent obtained   ?Lesion destroyed using liquid nitrogen: Yes   ?Outcome: patient tolerated procedure well with no complications   ?Post-procedure details: wound care instructions given   ? ?Other viral warts ?Left Mid Back x1 ? ?Discussed viral etiology and risk of spread.  Discussed multiple treatments may be required to clear warts.  Discussed possible post-treatment dyspigmentation and risk of recurrence. ? ?Prior to procedure, discussed risks of blister formation, small wound, skin dyspigmentation, or rare scar following cryotherapy. Recommend Vaseline ointment to treated areas while healing. ? ? ?Destruction of lesion - Left Mid Back x1 ? ?Destruction method: cryotherapy   ?Informed consent: discussed and consent obtained   ?Lesion destroyed using liquid nitrogen: Yes   ?Outcome: patient tolerated procedure well with no complications   ?Post-procedure details: wound care instructions given   ? ? ?Return for TBSE As Scheduled. ? ?I, Emelia Salisbury, CMA, am acting as scribe for Forest Gleason, MD. ? ?Documentation: I have reviewed the above documentation for accuracy and completeness, and I agree with the above. ? ?Forest Gleason, MD ? ? ?

## 2021-06-23 NOTE — Patient Instructions (Addendum)
Cryotherapy Aftercare ? ?Wash gently with soap and water everyday.   ?Apply Vaseline and Band-Aid daily until healed.  ? ?Prior to procedure, discussed risks of blister formation, small wound, skin dyspigmentation, or rare scar following cryotherapy. Recommend Vaseline ointment to treated areas while healing.  ? ?Dermatitis on legs:  ?Restart Opzelura cream once daily. ? ? ?If You Need Anything After Your Visit ? ?If you have any questions or concerns for your doctor, please call our main line at [phone] and press option 4 to reach your doctor's medical assistant. If no one answers, please leave a voicemail as directed and we will return your call as soon as possible. Messages left after 4 pm will be answered the following business day.  ? ?You may also send [place] a message via MyChart. We typically respond to MyChart messages within 1-2 business days. ? ?For prescription refills, please ask your pharmacy to contact our office. Our fax number is [phone]. ? ?If you have an urgent issue when the clinic is closed that cannot wait until the next business day, you can page your doctor at the number below.   ? ?Please note that while we do our best to be available for urgent issues outside of office hours, we are not available 24/7.  ? ?If you have an urgent issue and are unable to reach [place], you may choose to seek medical care at your doctor's office, retail clinic, urgent care center, or emergency room. ? ?If you have a medical emergency, please immediately call 911 or go to the emergency department. ? ?Pager Numbers ? ?- Dr. Nehemiah Massed: [phone] ? ?- Dr. Laurence Ferrari: [phone] ? ?- Dr. Nicole Kindred: [phone] ? ?In the event of inclement weather, please call our main line at [phone] for an update on the status of any delays or closures. ? ?Dermatology Medication Tips: ?Please keep the boxes that topical medications come in in order to help keep track of the instructions about where and how to use these.  Pharmacies typically print the medication instructions only on the boxes and not directly on the medication tubes.  ? ?If your medication is too expensive, please contact our office at [phone] option 4 or send [place] a message through [place].  ? ?We are unable to tell what your co-pay for medications will be in advance as this is different depending on your insurance coverage. However, we may be able to find a substitute medication at lower cost or fill out paperwork to get insurance to cover a needed medication.  ? ?If a prior authorization is required to get your medication covered by your insurance company, please allow [place] 1-2 business days to complete this process. ? ?Drug prices often vary depending on where the prescription is filled and some pharmacies may offer cheaper prices. ? ?The website www.goodrx.com contains coupons for medications through different pharmacies. The prices here do not account for what the cost may be with help from insurance (it may be cheaper with your insurance), but the website can give you the price if you did not use any insurance.  ?- You can print the associated coupon and take it with your prescription to the pharmacy.  ?- You may also stop by our office during regular business hours and pick up a GoodRx coupon card.  ?- If you need your prescription sent electronically to a different pharmacy, notify our office through [facility] or by phone at [phone] option 4. ? ? ? ? ?Si Usted Necesita Algo Despu?s  de Su Visita ? ?Tambi?n puede enviarnos un mensaje a trav?s de MyChart. Por lo general respondemos a los mensajes de MyChart en el transcurso de 1 a 2 d?as h?biles. ? ?Para renovar recetas, por favor pida a su farmacia que se ponga en contacto con nuestra oficina. Nuestro n?mero de fax es el [phone]. ? ?Si tiene un asunto urgente cuando la cl?nica est? cerrada y que no puede esperar hasta el siguiente d?a h?bil, puede llamar/localizar a su doctor(a) al n?mero  que aparece a continuaci?n.  ? ?Por favor, tenga en cuenta que aunque hacemos todo lo posible para estar disponibles para asuntos urgentes fuera del horario de oficina, no estamos disponibles las 24 horas del d?a, los 7 d?as de la semana.  ? ?Si tiene un problema urgente y no puede comunicarse con nosotros, puede optar por buscar atenci?n m?dica  en el consultorio de su doctor(a), en una cl?nica privada, en un centro de atenci?n urgente o en una sala de emergencias. ? ?Si tiene Engineer, maintenance (IT) m?dica, por favor llame inmediatamente al 911 o vaya a la sala de emergencias. ? ?N?meros de b?per ? ?- Dr. Nehemiah Massed: [phone] ? ?- Dra. Moye: [phone] ? ?- Dra. Nicole Kindred: [phone] ? ?En caso de inclemencias del tiempo, por favor llame a nuestra l?nea principal al [phone] para una actualizaci?n sobre el estado de cualquier retraso o cierre. ? ?Consejos para la medicaci?n en dermatolog?a: ?Por favor, guarde las cajas en las que vienen los medicamentos de uso t?pico para ayudarle a seguir las instrucciones sobre d?nde y c?mo usarlos. Las farmacias generalmente imprimen las instrucciones del medicamento s?lo en las cajas y no directamente en los tubos del [place].  ? ?Si su medicamento es muy caro, por favor, p?ngase en contacto con Zigmund Daniel llamando al [phone] y presione la opci?n 4 o env?enos un mensaje a trav?s de MyChart.  ? ?No podemos decirle cu?l ser? su copago por los medicamentos por adelantado ya que esto es diferente dependiendo de la cobertura de su seguro. Sin embargo, es posible que podamos encontrar un medicamento sustituto a Electrical engineer un formulario para que el seguro cubra el medicamento que se considera necesario.  ? ?Si se requiere Ardelia Mems autorizaci?n previa para que su compa??a de seguros [place] su medicamento, por favor perm?tanos de 1 a 2 d?as h?biles para completar este proceso. ? ?Los precios de los medicamentos var?an con frecuencia dependiendo del Environmental consultant de d?nde se  surte la receta y alguna farmacias pueden ofrecer precios m?s baratos. ? ?El sitio web www.goodrx.com tiene cupones para medicamentos de Airline pilot. Los precios aqu? no tienen en cuenta lo que podr?a costar con la ayuda del seguro (puede ser m?s barato con su seguro), pero el sitio web puede darle el precio si no utiliz? ning?n seguro.  ?- Puede imprimir el cup?n correspondiente y llevarlo con su receta a la farmacia.  ?- Tambi?n puede pasar por nuestra oficina durante el horario de atenci?n regular y recoger una tarjeta de cupones de GoodRx.  ?- Si necesita que su receta se env?e electr?nicamente a Chiropodist, informe a nuestra oficina a trav?s de MyChart de [place] o por tel?fono llamando al [phone] y presione la opci?n 4.  ?

## 2021-06-24 ENCOUNTER — Encounter: Payer: Self-pay | Admitting: Dermatology

## 2021-07-08 ENCOUNTER — Other Ambulatory Visit: Payer: Self-pay | Admitting: Family Medicine

## 2021-08-02 ENCOUNTER — Other Ambulatory Visit: Payer: Self-pay | Admitting: Family Medicine

## 2021-08-02 DIAGNOSIS — Z1231 Encounter for screening mammogram for malignant neoplasm of breast: Secondary | ICD-10-CM

## 2021-09-06 ENCOUNTER — Ambulatory Visit
Admission: RE | Admit: 2021-09-06 | Discharge: 2021-09-06 | Disposition: A | Discharge status: Home / Self Care | Payer: Medicare Other | Source: Ambulatory Visit | Attending: Family Medicine | Admitting: Family Medicine

## 2021-09-06 DIAGNOSIS — Z1231 Encounter for screening mammogram for malignant neoplasm of breast: Secondary | ICD-10-CM | POA: Diagnosis not present

## 2021-10-09 ENCOUNTER — Other Ambulatory Visit: Payer: Self-pay | Admitting: Family Medicine

## 2021-10-09 DIAGNOSIS — E785 Hyperlipidemia, unspecified: Secondary | ICD-10-CM

## 2021-11-08 DIAGNOSIS — H43813 Vitreous degeneration, bilateral: Secondary | ICD-10-CM | POA: Diagnosis not present

## 2021-11-08 DIAGNOSIS — Z961 Presence of intraocular lens: Secondary | ICD-10-CM | POA: Diagnosis not present

## 2021-11-08 DIAGNOSIS — H02403 Unspecified ptosis of bilateral eyelids: Secondary | ICD-10-CM | POA: Diagnosis not present

## 2021-11-17 ENCOUNTER — Ambulatory Visit (INDEPENDENT_AMBULATORY_CARE_PROVIDER_SITE_OTHER): Payer: Medicare Other

## 2021-11-17 VITALS — Ht 66.0 in | Wt 175.0 lb

## 2021-11-17 DIAGNOSIS — Z0000 Encounter for general adult medical examination without abnormal findings: Secondary | ICD-10-CM

## 2021-11-17 NOTE — Progress Notes (Signed)
Subjective:   Sabrina Wilson is a 81 y.o. female who presents for Medicare Annual (Subsequent) preventive examination.  Review of Systems    No ROS.  Medicare Wellness Virtual Visit.  Visual/audio telehealth visit, UTA vital signs.   See social history for additional risk factors.         Objective:    Today's Vitals   11/17/21 1051  Weight: 175 lb (79.4 kg)  Height: '5\' 6"'$  (1.676 m)   Body mass index is 28.25 kg/m.     11/17/2021   10:59 AM 11/16/2020    1:46 PM 11/14/2019   11:24 AM 11/13/2018   11:13 AM 11/08/2017    6:51 AM 09/13/2017    8:30 AM 06/19/2017    2:45 PM  Advanced Directives  Does Patient Have a Medical Advance Directive? Yes Yes Yes Yes No No No  Type of Paramedic of [place];Living will [place];Living will [place];Living will [place];Living will     Does patient want to make changes to medical advance directive? No - Patient declined No - Patient declined No - Patient declined No - Patient declined     Copy of [place] in Chart? No - copy requested No - copy requested No - copy requested No - copy requested     Would patient like information on creating a medical advance directive?      No - Patient declined No - Patient declined    Current Medications (verified) Outpatient Encounter Medications as of 11/17/2021  Medication Sig   atorvastatin (LIPITOR) 10 MG tablet TAKE ONE TABLET BY MOUTH DAILY   azelastine (ASTELIN) 0.1 % nasal spray Place 2 sprays into both nostrils 2 (two) times daily. Use in each nostril as directed   Calcium Carbonate-Vit D-Min (CALCIUM 1200 PO) Take by mouth daily.   clobetasol (TEMOVATE) 0.05 % external solution Apply to scalp once daily as needed for itch. Avoid applying to face, groin, and axilla. Use as directed. Long-term use can cause thinning of the skin.   clobetasol ointment (TEMOVATE) 1.49 % Apply 1 application  topically 2 (two) times daily. To affected areas of rash for up to 2-3 weeks. Avoid applying to face, groin, and axilla. Use as directed. Long-term use can cause thinning of the skin.   hydrochlorothiazide (HYDRODIURIL) 12.5 MG tablet TAKE ONE TABLET BY MOUTH DAILY   ketoconazole (NIZORAL) 2 % shampoo apply three times per week, massage into scalp and leave in for 10 minutes before rinsing out   mupirocin ointment (BACTROBAN) 2 % Apply 1 application topically 2 (two) times daily.   niacinamide 500 MG tablet Take 500 mg by mouth 2 (two) times daily.   Omega-3 Fatty Acids (FISH OIL PO) Take by mouth daily.   Ruxolitinib Phosphate (OPZELURA) 1.5 % CREA Apply 1 application topically daily.   sodium fluoride (FLUORISHIELD) 1.1 % GEL dental gel Place onto teeth.   VITAMIN D, CHOLECALCIFEROL, PO Take 2,000 Units by mouth daily.   No facility-administered encounter medications on file as of 11/17/2021.    Allergies (verified) Sulfa antibiotics   History: Past Medical History:  Diagnosis Date   Actinic keratosis 02/12/2020   R lat calf (hypertrophic)   Arthritis    Cancer of sigmoid ([place]) 06/17/2016   Colon cancer ([place]) 2009   T3,N0; s/p resection  Dr. Hulda Humphrey and chemotherapy by Dr. Cynda Acres   Diastolic dysfunction    a. 08/2020 Echo: EF 60-65%, no  rwma, Gr1 DD, nl RV size/fxn. Mild MR.   Hernia of flank    History of actinic keratoses 08/11/2020   Bx proven at left lateral ankle proximal, LN2 10/08/20   History of stress test    a. 09/2020 MV: EF >65%. No ischemia/infarct. Mild Ao Ca2+ and minimal Cor Ca2+.   Hypertension    Neuropathy    Polyp at cervical os    s/p resection Dr. Sabra Heck   Skin cancer 01/22/2020   surface of an atypical verrucous squamous proliferation    Squamous cell carcinoma of skin 07/16/2020   Left lateral ankle anterior, treated with [facility] 08-11-2020   Past Surgical History:  Procedure Laterality Date   BREAST BIOPSY Right 05/2014   Dr. Dwyane Luo office-benign    BREAST SURGERY Right February 2016   Vacuum assisted biopsy for recurrent cyst, fibrocystic changes without evidence of malignancy.   CATARACT EXTRACTION W/PHACO Left 01/13/2015   Procedure: CATARACT EXTRACTION PHACO AND INTRAOCULAR LENS PLACEMENT (IOC);  Surgeon: Birder Robson, MD;  Location: [facility] ORS;  Service: Ophthalmology;  Laterality: Left;  [place]  1:02.9 AP   19.2 CDE  12.06 casette lot #  [id number]   CATARACT EXTRACTION W/PHACO Right 02/03/2015   Procedure: CATARACT EXTRACTION PHACO AND INTRAOCULAR LENS PLACEMENT (IOC);  Surgeon: Birder Robson, MD;  Location: [facility] ORS;  Service: Ophthalmology;  Laterality: Right;  [id number]:53 ap46.5 [id number]   COLECTOMY     COLONOSCOPY  2015   Dr Candace Cruise   COLONOSCOPY WITH PROPOFOL N/A 09/13/2017   Procedure: COLONOSCOPY WITH PROPOFOL;  Surgeon: Robert Bellow, MD;  Location: [facility] ENDOSCOPY;  Service: Endoscopy;  Laterality: N/A;   COLONOSCOPY WITH PROPOFOL N/A 11/08/2017   Procedure: COLONOSCOPY WITH PROPOFOL;  Surgeon: Robert Bellow, MD;  Location: [facility] ENDOSCOPY;  Service: Endoscopy;  Laterality: N/A;   colostomy reversal     DILATION AND CURETTAGE OF UTERUS  2014   Dr. Sabra Heck, benign per pt   EYE SURGERY     HERNIA REPAIR  07/13/12   ventral    SKIN CANCER EXCISION     TONSILLECTOMY     Family History  Problem Relation Age of Onset   Stroke Mother    Diabetes Mother    Cancer Father        colon   Stroke Sister    Cancer Brother        colon   Cancer Brother        brain   Social History   Socioeconomic History   Marital status: Married    Spouse name: Not on file   Number of children: Not on file   Years of education: Not on file   Highest education level: Not on file  Occupational History   Not on file  Tobacco Use   Smoking status: Former    Types: Cigarettes    Quit date: 10/12/2007    Years since quitting: 14.1   Smokeless tobacco: Never  Vaping Use   Vaping Use: Never used  Substance and Sexual Activity    Alcohol use: Yes    Alcohol/week: 1.0 standard drink of alcohol    Types: 1 Standard drinks or equivalent per week    Comment: with dinner   Drug use: No   Sexual activity: Not on file  Other Topics Concern   Not on file  Social History Narrative   Lives in [place] with husband. 2 children, son lives nearby.      Diet - regular  Exercise - none   Social Determinants of Health   Financial Resource Strain: Low Risk  (11/13/2018)   Overall Financial Resource Strain (CARDIA)    Difficulty of Paying Living Expenses: Not hard at all  Food Insecurity: No Food Insecurity (11/16/2020)   Hunger Vital Sign    Worried About Running Out of Food in the Last Year: Never true    Ran Out of Food in the Last Year: Never true  Transportation Needs: No Transportation Needs (11/16/2020)   PRAPARE - Hydrologist (Medical): No    Lack of Transportation (Non-Medical): No  Physical Activity: Not on file  Stress: No Stress Concern Present (11/16/2020)   [place]    Feeling of Stress : Not at all  Social Connections: Not on file   Tobacco Counseling Counseling given: Not Answered  Clinical Intake: Pre-visit preparation completed: Yes        Diabetes: No  How often do you need to have someone help you when you read instructions, pamphlets, or other written materials from your doctor or pharmacy?: 1 - Never  Interpreter Needed?: No    Activities of Daily Living     No data to display          Patient Care Team: Leone Haven, MD as PCP - General (Family Medicine) Kate Sable, MD as PCP - Cardiology (Cardiology) Bary Castilla, Forest Gleason, MD as Consulting Physician (General Surgery) Dallas Schimke, MD (Internal Medicine)  Indicate any recent Medical Services you may have received from other than [facility] providers in the past year (date may be approximate).     Assessment:    This is a routine wellness examination for Sharai.  Virtual Visit via Telephone Note  I connected with  Lewanda Rife on 11/17/21 at 10:45 AM EDT by telephone and verified that I am speaking with the correct person using two identifiers.  Location: Patient: home Provider: office Persons participating in the virtual visit: patient/Nurse Health Advisor   I discussed the limitations of performing an evaluation and management service by telehealth. We continued and completed visit with audio only. Some vital signs may be absent or patient reported.   Hearing/Vision screen Hearing Screening - Comments:: Patient is able to hear conversational tones without difficulty. No issues reported. Vision Screening - Comments:: Followed by [facility] Wears readers lenses Cataract extraction, bilateral They have seen their ophthalmologist in the last 12 months.  Dietary issues and exercise activities discussed:   Regular diet Good water intake    Goals Addressed               This Visit's Progress     Patient Stated     Maintain Healthy Lifestyle (pt-stated)   On track     Stay active Healthy diet       Depression Screen    05/24/2021    2:26 PM 11/17/2020   12:46 PM 11/16/2020    1:24 PM 08/18/2020   12:31 PM 02/26/2020   11:09 AM 11/14/2019   11:13 AM 11/13/2018   11:17 AM  PHQ 2/9 Scores  PHQ - 2 Score 0 0 0 0 0 0 0    Fall Risk    05/24/2021    2:26 PM 11/16/2020    1:47 PM 08/18/2020   12:30 PM 02/26/2020   11:09 AM 11/14/2019   11:12 AM  Fall Risk   Falls in the past year? 0 0  0 0 0  Number falls in past yr: 0 0 0 0 0  Injury with Fall? 0 0   0  Risk for fall due to : No Fall Risks      Follow up Falls evaluation completed Falls evaluation completed Falls evaluation completed Falls evaluation completed Falls evaluation completed   [place]: Home free of loose throw rugs in walkways, pet beds, electrical cords, etc? Yes   Adequate lighting in your home to reduce risk of falls? Yes   ASSISTIVE DEVICES UTILIZED TO PREVENT FALLS: Life alert? No  Use of a cane, walker or w/c? No   TIMED UP AND GO: Was the test performed? No .   Cognitive Function: Patient is alert and oriented x3.  Enjoys playing piano, crossword puzzles and other brain health exercises.      11/14/2019   11:25 AM  MMSE - Mini Mental State Exam  Not completed: Unable to complete        11/13/2018   11:18 AM  6CIT Screen  What Year? 0 points  What month? 0 points  What time? 0 points  Count back from 20 0 points  Months in reverse 0 points  Repeat phrase 0 points  Total Score 0 points   Immunizations Immunization History  Administered Date(s) Administered   Fluad Quad(high Dose 65+) 12/19/2018, 12/21/2018, 01/14/2020   Hepb-cpg 12/11/2019, 01/14/2020   Influenza Split 01/19/2011, 03/26/2012   Influenza, High Dose Seasonal PF 02/03/2014, 02/07/2017, 01/31/2018, 01/18/2021   Influenza,inj,Quad PF,6+ Mos 02/07/2013, 01/22/2015, 02/01/2016   Influenza-Unspecified 02/06/2014   Moderna Covid-19 Vaccine Bivalent Booster 66yr & up 01/18/2021   Moderna Sars-Covid-2 Vaccination 05/03/2019, 05/31/2019, 02/17/2020, 08/11/2020   Pneumococcal Conjugate-13 09/27/2013   Pneumococcal Polysaccharide-23 04/12/2009   Tdap 11/26/2019   Zoster Recombinat (Shingrix) 06/11/2018, 08/30/2018   Zoster, Live 02/03/2011   Screening Tests Health Maintenance  Topic Date Due   INFLUENZA VACCINE  11/23/2021   TETANUS/TDAP  11/25/2029   Pneumonia Vaccine 79 Years old  Completed   DEXA SCAN  Completed   COVID-19 Vaccine  Completed   Hepatitis C Screening  Completed   Zoster Vaccines- Shingrix  Completed   HPV VACCINES  Aged Out   COLONOSCOPY (Pts 45-442yrInsurance coverage will need to be confirmed)  Discontinued   Health Maintenance There are no preventive care reminders to display for this patient.  Lung Cancer Screening: (Low Dose CT  Chest recommended if Age 79-80ears, 30 pack-year currently smoking OR have quit w/in 15years.) does not qualify.   Hepatitis C Screening: does not qualify.  Vision Screening: Recommended annual ophthalmology exams for early detection of glaucoma and other disorders of the eye.  Dental Screening: Recommended annual dental exams for proper oral hygiene  Community Resource Referral / Chronic Care Management: CRR required this visit?  No   CCM required this visit?  No      Plan:   Keep all routine maintenance appointments.   I have personally reviewed and noted the following in the patient's chart:   Medical and social history Use of alcohol, tobacco or illicit drugs  Current medications and supplements including opioid prescriptions.  Functional ability and status Nutritional status Physical activity Advanced directives List of other physicians Hospitalizations, surgeries, and ER visits in previous 12 months Vitals Screenings to include cognitive, depression, and falls Referrals and appointments  In addition, I have reviewed and discussed with patient certain preventive protocols, quality metrics, and best practice recommendations. A written personalized  care plan for preventive services as well as general preventive health recommendations were provided to patient.     Varney Biles, LPN   10/15/6331

## 2021-11-17 NOTE — Patient Instructions (Addendum)
  Ms. Koslow , Thank you for taking time to come for your Medicare Wellness Visit. I appreciate your ongoing commitment to your health goals. Please review the following plan we discussed and let me know if I can assist you in the future.   These are the goals we discussed:  Goals       Patient Stated     Maintain Healthy Lifestyle (pt-stated)      Stay active Healthy diet        This is a list of the screening recommended for you and due dates:  Health Maintenance  Topic Date Due   Flu Shot  11/23/2021   Tetanus Vaccine  11/25/2029   Pneumonia Vaccine  Completed   DEXA scan (bone density measurement)  Completed   COVID-19 Vaccine  Completed   Hepatitis C Screening: USPSTF Recommendation to screen - Ages 36-79 yo.  Completed   Zoster (Shingles) Vaccine  Completed   HPV Vaccine  Aged Out   Colon Cancer Screening  Discontinued

## 2021-11-22 ENCOUNTER — Ambulatory Visit: Payer: Medicare Other | Admitting: Family Medicine

## 2021-11-24 ENCOUNTER — Ambulatory Visit (INDEPENDENT_AMBULATORY_CARE_PROVIDER_SITE_OTHER): Payer: Medicare Other | Admitting: Family Medicine

## 2021-11-24 ENCOUNTER — Encounter: Payer: Self-pay | Admitting: Family Medicine

## 2021-11-24 DIAGNOSIS — J309 Allergic rhinitis, unspecified: Secondary | ICD-10-CM | POA: Diagnosis not present

## 2021-11-24 DIAGNOSIS — R7303 Prediabetes: Secondary | ICD-10-CM

## 2021-11-24 DIAGNOSIS — I10 Essential (primary) hypertension: Secondary | ICD-10-CM | POA: Diagnosis not present

## 2021-11-24 LAB — POCT GLYCOSYLATED HEMOGLOBIN (HGB A1C): Hemoglobin A1C: 5.8 % — AB (ref 4.0–5.6)

## 2021-11-24 NOTE — Patient Instructions (Signed)
Nice to see you. Please continue with healthy diet and try to maintain your activity level or increase your activity level.

## 2021-11-24 NOTE — Assessment & Plan Note (Signed)
Poorly controlled.  She will switch to Claritin over-the-counter.  I did discuss starting a different nasal spray though she declines that.

## 2021-11-24 NOTE — Progress Notes (Signed)
Tommi Rumps, MD Phone: [phone]  Sabrina Wilson is a 81 y.o. female who presents today for f/u.  HYPERTENSION Disease Monitoring Home BP Monitoring 120s/70s Chest pain- no    Dyspnea- no Medications Compliance-  taking HCTZ.   Edema- no BMET    Component Value Date/Time   NA 137 05/24/2021 1447   NA 133 (L) 07/15/2012 2105   K 4.0 05/24/2021 1447   K 3.8 07/18/2012 0503   CL 98 05/24/2021 1447   CL 100 07/15/2012 2105   CO2 32 05/24/2021 1447   CO2 28 07/15/2012 2105   GLUCOSE 91 05/24/2021 1447   GLUCOSE 136 (H) 07/15/2012 2105   BUN 21 05/24/2021 1447   BUN 10 07/15/2012 2105   CREATININE 0.86 05/24/2021 1447   CREATININE 0.68 07/15/2012 2105   CALCIUM 10.4 05/24/2021 1447   CALCIUM 8.4 (L) 07/15/2012 2105   GFRNONAA 52 (L) 06/19/2017 1429   GFRNONAA >60 07/15/2012 2105   GFRAA 60 (L) 06/19/2017 1429   GFRAA >60 07/15/2012 2105   Prediabetes: Patient gets some activity around the house though no significant exercise given that her neuropathy limits this.  She has some serial that does have some sugar in it in the morning breakfast.  She will have a sandwich and fruit or leftovers for lunch.  She will have a vegetable or salad and meat for supper.  No soda or sweet tea.  Allergic rhinitis: Patient continues to have some issues with rhinorrhea, postnasal drip, and sneezing.  In the morning she coughs up some mucus.  She has been taking Zyrtec.  She notes Flonase and Astelin were not beneficial.  She is thinking about switching to Claritin.  Social History   Tobacco Use  Smoking Status Former   Types: Cigarettes   Quit date: 10/12/2007   Years since quitting: 14.1  Smokeless Tobacco Never    Current Outpatient Medications on File Prior to Visit  Medication Sig Dispense Refill   atorvastatin (LIPITOR) 10 MG tablet TAKE ONE TABLET BY MOUTH DAILY 90 tablet 1   azelastine (ASTELIN) 0.1 % nasal spray Place 2 sprays into both nostrils 2 (two) times daily. Use in  each nostril as directed 30 mL 12   Calcium Carbonate-Vit D-Min (CALCIUM 1200 PO) Take by mouth daily.     clobetasol (TEMOVATE) 0.05 % external solution Apply to scalp once daily as needed for itch. Avoid applying to face, groin, and axilla. Use as directed. Long-term use can cause thinning of the skin. 50 mL 2   clobetasol ointment (TEMOVATE) 8.14 % Apply 1 application topically 2 (two) times daily. To affected areas of rash for up to 2-3 weeks. Avoid applying to face, groin, and axilla. Use as directed. Long-term use can cause thinning of the skin. 45 g 1   hydrochlorothiazide (HYDRODIURIL) 12.5 MG tablet TAKE ONE TABLET BY MOUTH DAILY 90 tablet 1   ketoconazole (NIZORAL) 2 % shampoo apply three times per week, massage into scalp and leave in for 10 minutes before rinsing out 120 mL 5   mupirocin ointment (BACTROBAN) 2 % Apply 1 application topically 2 (two) times daily. 22 g 0   niacinamide 500 MG tablet Take 500 mg by mouth 2 (two) times daily.     Omega-3 Fatty Acids (FISH OIL PO) Take by mouth daily.     Ruxolitinib Phosphate (OPZELURA) 1.5 % CREA Apply 1 application topically daily. 60 g 2   sodium fluoride (FLUORISHIELD) 1.1 % GEL dental gel Place onto teeth.  VITAMIN D, CHOLECALCIFEROL, PO Take 2,000 Units by mouth daily.     No current facility-administered medications on file prior to visit.     ROS see history of present illness  Objective  Physical Exam Vitals:   11/24/21 1035  BP: 120/80  Pulse: 83  Temp: 98.3 F (36.8 C)  SpO2: 95%    BP Readings from Last 3 Encounters:  11/24/21 120/80  05/24/21 128/78  11/17/20 140/68   Wt Readings from Last 3 Encounters:  11/24/21 191 lb 3.2 oz (86.7 kg)  11/17/21 175 lb (79.4 kg)  05/24/21 175 lb (79.4 kg)    Physical Exam Constitutional:      General: She is not in acute distress.    Appearance: She is not diaphoretic.  Cardiovascular:     Rate and Rhythm: Normal rate and regular rhythm.     Heart sounds: Normal  heart sounds.  Pulmonary:     Effort: Pulmonary effort is normal.     Breath sounds: Normal breath sounds.  Musculoskeletal:     Right lower leg: No edema.     Left lower leg: No edema.  Skin:    General: Skin is warm and dry.  Neurological:     Mental Status: She is alert.      Assessment/Plan: Please see individual problem list.  Problem List Items Addressed This Visit     Allergic rhinitis (Chronic)    Poorly controlled.  She will switch to Claritin over-the-counter.  I did discuss starting a different nasal spray though she declines that.      Hypertension (Chronic)    Well-controlled.  She will continue HCTZ 12.5 mg daily.      Prediabetes (Chronic)    I encouraged continued healthy diet.  Discussed increasing her activity level.  Check A1c.        Return in about 6 months (around 05/27/2022).   Tommi Rumps, MD [place]

## 2021-11-24 NOTE — Assessment & Plan Note (Signed)
I encouraged continued healthy diet.  Discussed increasing her activity level.  Check A1c.

## 2021-11-24 NOTE — Assessment & Plan Note (Signed)
Well-controlled.  She will continue HCTZ 12.5 mg daily.

## 2021-12-09 DIAGNOSIS — H57813 Brow ptosis, bilateral: Secondary | ICD-10-CM | POA: Diagnosis not present

## 2021-12-21 ENCOUNTER — Encounter: Payer: Self-pay | Admitting: Family Medicine

## 2021-12-22 ENCOUNTER — Telehealth: Payer: Self-pay | Admitting: Family Medicine

## 2021-12-22 ENCOUNTER — Ambulatory Visit (INDEPENDENT_AMBULATORY_CARE_PROVIDER_SITE_OTHER): Payer: Medicare Other | Admitting: Family Medicine

## 2021-12-22 DIAGNOSIS — N649 Disorder of breast, unspecified: Secondary | ICD-10-CM | POA: Insufficient documentation

## 2021-12-22 NOTE — Progress Notes (Signed)
  Tommi Rumps, MD Phone: [phone]  Sabrina Wilson is a 81 y.o. female who presents today for same-day visit.  Right breast lesion: Patient notes this first appeared a couple weeks ago.  It itches.  It does not hurt.  There has been no drainage.  Social History   Tobacco Use  Smoking Status Former   Types: Cigarettes   Quit date: 10/12/2007   Years since quitting: 14.2  Smokeless Tobacco Never    Current Outpatient Medications on File Prior to Visit  Medication Sig Dispense Refill   atorvastatin (LIPITOR) 10 MG tablet TAKE ONE TABLET BY MOUTH DAILY 90 tablet 1   azelastine (ASTELIN) 0.1 % nasal spray Place 2 sprays into both nostrils 2 (two) times daily. Use in each nostril as directed 30 mL 12   Calcium Carbonate-Vit D-Min (CALCIUM 1200 PO) Take by mouth daily.     clobetasol (TEMOVATE) 0.05 % external solution Apply to scalp once daily as needed for itch. Avoid applying to face, groin, and axilla. Use as directed. Long-term use can cause thinning of the skin. 50 mL 2   clobetasol ointment (TEMOVATE) 7.67 % Apply 1 application topically 2 (two) times daily. To affected areas of rash for up to 2-3 weeks. Avoid applying to face, groin, and axilla. Use as directed. Long-term use can cause thinning of the skin. 45 g 1   hydrochlorothiazide (HYDRODIURIL) 12.5 MG tablet TAKE ONE TABLET BY MOUTH DAILY 90 tablet 1   ketoconazole (NIZORAL) 2 % shampoo apply three times per week, massage into scalp and leave in for 10 minutes before rinsing out 120 mL 5   mupirocin ointment (BACTROBAN) 2 % Apply 1 application topically 2 (two) times daily. 22 g 0   niacinamide 500 MG tablet Take 500 mg by mouth 2 (two) times daily.     Omega-3 Fatty Acids (FISH OIL PO) Take by mouth daily.     Ruxolitinib Phosphate (OPZELURA) 1.5 % CREA Apply 1 application topically daily. 60 g 2   sodium fluoride (FLUORISHIELD) 1.1 % GEL dental gel Place onto teeth.     VITAMIN D, CHOLECALCIFEROL, PO Take 2,000 Units  by mouth daily.     No current facility-administered medications on file prior to visit.     ROS see history of present illness  Objective  Physical Exam Vitals:   12/22/21 1530  BP: 130/80  Pulse: 74  Temp: 98 F (36.7 C)  SpO2: 97%    BP Readings from Last 3 Encounters:  12/22/21 130/80  11/24/21 120/80  05/24/21 128/78   Wt Readings from Last 3 Encounters:  12/22/21 188 lb (85.3 kg)  11/24/21 191 lb 3.2 oz (86.7 kg)  11/17/21 175 lb (79.4 kg)    Physical Exam Chest:       Comments: Fulton Mole, CMA served as chaperone, other than noted above there are no other lesions palpated in her breast or axilla bilaterally     Assessment/Plan: Please see individual problem list.  Problem List Items Addressed This Visit     Breast lesion    Right-sided diagnostic mammogram and ultrasound ordered.  Patient will contact us if she does not hear from scheduling within the next week.      Relevant Orders   MM DIAG BREAST TOMO UNI RIGHT   US BREAST LTD UNI RIGHT INC AXILLA    Return in about 1 month (around 01/22/2022) for Recheck breast lesion.   Tommi Rumps, MD [place]

## 2021-12-22 NOTE — Patient Instructions (Signed)
Somebody should contact you about the mammogram and ultrasound.  If you do not hear from somebody in the next week please let us know.

## 2021-12-22 NOTE — Assessment & Plan Note (Signed)
Right-sided diagnostic mammogram and ultrasound ordered.  Patient will contact us if she does not hear from scheduling within the next week.

## 2021-12-22 NOTE — Telephone Encounter (Signed)
I called and LVM for patient to call back, the provider would like to see her in 1 mo to follow up on her mammogram.  Amour Trigg,cma

## 2021-12-22 NOTE — Telephone Encounter (Signed)
This patient left before I could give her the paperwork.  She needs follow-up in about a month to recheck the breast lesion.  Please get her scheduled.  Thanks.

## 2021-12-23 NOTE — Telephone Encounter (Signed)
I called the patient and scheduled her for a follow up on her breast lesion in 1 month per the provider.  Keiston Manley,cma

## 2021-12-28 ENCOUNTER — Ambulatory Visit
Admission: RE | Admit: 2021-12-28 | Discharge: 2021-12-28 | Disposition: A | Discharge status: Home / Self Care | Payer: Medicare Other | Source: Ambulatory Visit | Attending: Family Medicine | Admitting: Family Medicine

## 2021-12-28 DIAGNOSIS — N649 Disorder of breast, unspecified: Secondary | ICD-10-CM | POA: Insufficient documentation

## 2021-12-28 DIAGNOSIS — N6310 Unspecified lump in the right breast, unspecified quadrant: Secondary | ICD-10-CM | POA: Diagnosis not present

## 2021-12-28 DIAGNOSIS — R928 Other abnormal and inconclusive findings on diagnostic imaging of breast: Secondary | ICD-10-CM | POA: Diagnosis not present

## 2021-12-29 ENCOUNTER — Other Ambulatory Visit: Payer: Self-pay | Admitting: Family Medicine

## 2021-12-29 DIAGNOSIS — R928 Other abnormal and inconclusive findings on diagnostic imaging of breast: Secondary | ICD-10-CM

## 2021-12-29 DIAGNOSIS — N63 Unspecified lump in unspecified breast: Secondary | ICD-10-CM

## 2021-12-30 DIAGNOSIS — N6313 Unspecified lump in the right breast, lower outer quadrant: Secondary | ICD-10-CM | POA: Diagnosis not present

## 2021-12-30 DIAGNOSIS — C50511 Malignant neoplasm of lower-outer quadrant of right female breast: Secondary | ICD-10-CM | POA: Diagnosis not present

## 2022-01-03 ENCOUNTER — Other Ambulatory Visit: Payer: Self-pay | Admitting: General Surgery

## 2022-01-03 DIAGNOSIS — C50911 Malignant neoplasm of unspecified site of right female breast: Secondary | ICD-10-CM

## 2022-01-03 DIAGNOSIS — D0501 Lobular carcinoma in situ of right breast: Secondary | ICD-10-CM

## 2022-01-04 ENCOUNTER — Telehealth: Payer: Self-pay

## 2022-01-04 NOTE — Telephone Encounter (Signed)
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 Fleet Contras ordered her a MRI. This is just FYI.  Abrham Maslowski,cma

## 2022-01-04 NOTE — Telephone Encounter (Signed)
Noted. Patient has already seen Dr Bary Castilla who is a Pharmacist, hospital. She will continue to follow with him on this.

## 2022-01-05 ENCOUNTER — Other Ambulatory Visit: Payer: Self-pay | Admitting: General Surgery

## 2022-01-05 DIAGNOSIS — C50911 Malignant neoplasm of unspecified site of right female breast: Secondary | ICD-10-CM

## 2022-01-06 ENCOUNTER — Encounter: Payer: Self-pay | Admitting: Family Medicine

## 2022-01-06 ENCOUNTER — Other Ambulatory Visit: Payer: Self-pay | Admitting: Family Medicine

## 2022-01-11 ENCOUNTER — Ambulatory Visit
Admission: RE | Admit: 2022-01-11 | Discharge: 2022-01-11 | Disposition: A | Discharge status: Home / Self Care | Payer: Medicare Other | Source: Ambulatory Visit | Attending: General Surgery | Admitting: General Surgery

## 2022-01-11 ENCOUNTER — Other Ambulatory Visit: Payer: Medicare Other

## 2022-01-11 DIAGNOSIS — C50911 Malignant neoplasm of unspecified site of right female breast: Secondary | ICD-10-CM | POA: Insufficient documentation

## 2022-01-11 DIAGNOSIS — C50919 Malignant neoplasm of unspecified site of unspecified female breast: Secondary | ICD-10-CM | POA: Diagnosis not present

## 2022-01-11 DIAGNOSIS — R222 Localized swelling, mass and lump, trunk: Secondary | ICD-10-CM | POA: Diagnosis not present

## 2022-01-11 DIAGNOSIS — N6311 Unspecified lump in the right breast, upper outer quadrant: Secondary | ICD-10-CM | POA: Diagnosis not present

## 2022-01-11 MED ORDER — GADOBUTROL 1 MMOL/ML IV SOLN
9.0000 mL | Freq: Once | INTRAVENOUS | Status: AC | PRN
  Administered 2022-01-11: 9 mL via INTRAVENOUS

## 2022-01-12 ENCOUNTER — Ambulatory Visit (INDEPENDENT_AMBULATORY_CARE_PROVIDER_SITE_OTHER): Payer: Medicare Other | Admitting: Family Medicine

## 2022-01-12 ENCOUNTER — Other Ambulatory Visit: Payer: Self-pay | Admitting: General Surgery

## 2022-01-12 ENCOUNTER — Encounter: Payer: Self-pay | Admitting: Family Medicine

## 2022-01-12 VITALS — BP 130/80 | HR 79 | Temp 98.1°F | Ht 66.0 in | Wt 188.8 lb

## 2022-01-12 DIAGNOSIS — Z23 Encounter for immunization: Secondary | ICD-10-CM

## 2022-01-12 DIAGNOSIS — C50919 Malignant neoplasm of unspecified site of unspecified female breast: Secondary | ICD-10-CM | POA: Diagnosis not present

## 2022-01-12 MED ORDER — AREXVY 120 MCG/0.5ML IM SUSR: 0.5000 mL | Freq: Once | INTRAMUSCULAR | 0 refills | Status: AC

## 2022-01-12 NOTE — Assessment & Plan Note (Signed)
Patient will continue to see general surgery.

## 2022-01-12 NOTE — Progress Notes (Addendum)
Tommi Rumps, MD Phone: [phone]  Sabrina Wilson is a 81 y.o. female who presents today for follow-up.    Breast cancer: Patient recently diagnosed with invasive lobular carcinoma.  She has seen general surgery and had an MRI of her breast.  She follows up with general surgery tomorrow for treatment planning.  She notes a little tenderness and soreness at the biopsy site though otherwise is doing well.   Social History   Tobacco Use  Smoking Status Former   Types: Cigarettes   Quit date: 10/12/2007   Years since quitting: 14.2  Smokeless Tobacco Never    Current Outpatient Medications on File Prior to Visit  Medication Sig Dispense Refill   atorvastatin (LIPITOR) 10 MG tablet TAKE ONE TABLET BY MOUTH DAILY 90 tablet 1   azelastine (ASTELIN) 0.1 % nasal spray Place 2 sprays into both nostrils 2 (two) times daily. Use in each nostril as directed 30 mL 12   Calcium Carbonate-Vit D-Min (CALCIUM 1200 PO) Take by mouth daily.     clobetasol (TEMOVATE) 0.05 % external solution Apply to scalp once daily as needed for itch. Avoid applying to face, groin, and axilla. Use as directed. Long-term use can cause thinning of the skin. 50 mL 2   clobetasol ointment (TEMOVATE) 8.67 % Apply 1 application topically 2 (two) times daily. To affected areas of rash for up to 2-3 weeks. Avoid applying to face, groin, and axilla. Use as directed. Long-term use can cause thinning of the skin. 45 g 1   hydrochlorothiazide (HYDRODIURIL) 12.5 MG tablet TAKE ONE TABLET BY MOUTH DAILY 90 tablet 1   ketoconazole (NIZORAL) 2 % shampoo apply three times per week, massage into scalp and leave in for 10 minutes before rinsing out 120 mL 5   mupirocin ointment (BACTROBAN) 2 % Apply 1 application topically 2 (two) times daily. 22 g 0   niacinamide 500 MG tablet Take 500 mg by mouth 2 (two) times daily.     Omega-3 Fatty Acids (FISH OIL PO) Take by mouth daily.     Ruxolitinib Phosphate (OPZELURA) 1.5 % CREA Apply 1  application topically daily. 60 g 2   sodium fluoride (FLUORISHIELD) 1.1 % GEL dental gel Place onto teeth.     VITAMIN D, CHOLECALCIFEROL, PO Take 2,000 Units by mouth daily.     No current facility-administered medications on file prior to visit.     ROS see history of present illness  Objective  Physical Exam Vitals:   01/12/22 1348  BP: 130/80  Pulse: 79  Temp: 98.1 F (36.7 C)  SpO2: 96%    BP Readings from Last 3 Encounters:  01/12/22 130/80  12/22/21 130/80  11/24/21 120/80   Wt Readings from Last 3 Encounters:  01/12/22 188 lb 12.8 oz (85.6 kg)  12/22/21 188 lb (85.3 kg)  11/24/21 191 lb 3.2 oz (86.7 kg)    Physical Exam Constitutional:      General: She is not in acute distress. Neurological:     Mental Status: She is alert.      Assessment/Plan: Please see individual problem list.  Problem List Items Addressed This Visit     Invasive lobular carcinoma of breast in female [facility])    Patient will continue to see general surgery.      Other Visit Diagnoses     Need for immunization against influenza    -  Primary   Relevant Orders   Flu Vaccine QUAD High Dose(Fluad) (Completed)  Health Maintenance: Flu vaccine given today.  RSV vaccine sent to the pharmacy.  She was encouraged to get the updated COVID-vaccine as well.  Return for as scheduled.   Tommi Rumps, MD [place]

## 2022-01-13 ENCOUNTER — Other Ambulatory Visit: Payer: Self-pay | Admitting: General Surgery

## 2022-01-13 DIAGNOSIS — I10 Essential (primary) hypertension: Secondary | ICD-10-CM | POA: Diagnosis not present

## 2022-01-13 DIAGNOSIS — C50011 Malignant neoplasm of nipple and areola, right female breast: Secondary | ICD-10-CM | POA: Diagnosis not present

## 2022-01-13 DIAGNOSIS — D0501 Lobular carcinoma in situ of right breast: Secondary | ICD-10-CM

## 2022-01-13 DIAGNOSIS — C50911 Malignant neoplasm of unspecified site of right female breast: Secondary | ICD-10-CM

## 2022-01-13 NOTE — Progress Notes (Signed)
Progress Notes - documented in this encounter Sabrina Wilson, Geronimo Boot, MD - 01/13/2022 11:30 AM EDT Formatting of this note is different from the original. Subjective:   Patient ID: Sabrina Wilson is a 81 y.o. female.  HPI  The following portions of the patient's history were reviewed and updated as appropriate.  This an established patient is here today for: office visit. Here to discuss breast MRI results from 01-11-22 and breast surgery options.  Review of Systems  Constitutional: Negative for chills and fever.  Respiratory: Negative for cough.   Chief Complaint  Patient presents with  Pre-op Exam    BP (!) 159/82  Pulse 83  Temp 36.8 C (98.2 F)  Ht 170.2 cm ('5\' 7"'$ )  Wt 85.3 kg (188 lb)  SpO2 96%  BMI 29.44 kg/m   BP recordings from PCP office notes:  BP Readings from Last 3 Encounters: 01/12/22 130/80 12/22/21 130/80 11/24/21 120/80  Past Medical History:  Diagnosis Date  Arthritis  Cancer of sigmoid (CMS-HCC) 06/17/2016  Colon cancer (CMS-HCC) 2009  Colon polyps 03/06/2014  Villous Adenoma  Diastolic dysfunction  History of actinic keratoses 08/11/2020  Hypertension  Neuropathy  Polyp at cervical os  Skin cancer 01/22/2020  Squamous cell carcinoma of skin 07/16/2020  left lateral ankle anterior    Past Surgical History:  Procedure Laterality Date  dilation and curettage of uterus 2014  OPEN VENTRAL HERNIA REPAIR 07/13/2012  COLONOSCOPY 03/06/2014  Villous Adenoma/Repeat 24yr/PYO  BREAST EXCISIONAL BIOPSY 05/2014  CATARACT EXTRACTION Left 01/13/2015  CATARACT EXTRACTION Right 02/03/2015  COLONOSCOPY 09/13/2017  COLONOSCOPY 11/08/2017  BREAST EXCISIONAL BIOPSY Right 12/30/2021  colostomy reversal  history of colectomy  skin cancer excision  TONSILLECTOMY    OB History   Gravida  2  Para  2  Term   Preterm   AB   Living     SAB   IAB   Ectopic   Molar   Multiple   Live Births     Obstetric Comments  Age at  first period 89Age of first pregnancy 89     Social History   Socioeconomic History  Marital status: Unknown  Tobacco Use  Smoking status: Former  Types: Cigarettes  Quit date: 10/12/2007  Years since quitting: 14.2  Smokeless tobacco: Never  Substance and Sexual Activity  Alcohol use: Yes  Comment: occasional  Drug use: Never    Allergies  Allergen Reactions  Sulfa (Sulfonamide Antibiotics) Hives   Current Outpatient Medications  Medication Sig Dispense Refill  atorvastatin (LIPITOR) 10 MG tablet Take 1 tablet by mouth once daily  calcium carbonate-vitamin D3 (CALTRATE 600+D) 600 mg-10 mcg (400 unit) tablet Take 2 tablets by mouth once daily  cholecalciferol (VITAMIN D3) 1000 unit capsule Take 1,000 Units by mouth once daily  hydroCHLOROthiazide (HYDRODIURIL) 12.5 MG tablet Take 1 tablet by mouth once daily  niacinamide 500 mg tablet Take 500 mg by mouth 2 (two) times daily with meals  OMEGA-3/DHA/EPA/FISH OIL (FISH OIL-OMEGA-3 FATTY ACIDS) 300-1,000 mg capsule Take 2 capsules by mouth once daily.  diazePAM (VALIUM) 10 MG tablet One tablet one hour prior to MRI (Patient not taking: Reported on 01/13/2022) 1 tablet 0  ergocalciferol, vitamin D2, 50,000 unit capsule 2 tablets daily. (Patient not taking: Reported on 12/30/2021)  losartan (COZAAR) 100 MG tablet Take 1 tablet by mouth once daily. (Patient not taking: Reported on 01/13/2022)  polyethylene glycol (MIRALAX) powder Take as directed for colonoscopy prep. (Patient not taking: Reported on 12/30/2021) 255 g 0  No current facility-administered medications for this visit.   Family History  Problem Relation Age of Onset  Diabetes Mother  Stroke Mother  Colon cancer Father  Stroke Sister  Colon cancer Brother  Brain cancer Brother  Breast cancer Neg Hx   Labs and Radiology:   December 30, 2021 core biopsy right breast:  Comment: Specimen A-INVASIVE MAMMARY CARCINOMA WITH MORPHOLOGIC FEATURES MOST CONSISTENT WITH  INVASIVE PLEOMORPHIC LOBULAR CARCINOMA. THE LINEAR EXTENT OF CARCINOMA IN THE LONGEST CORE: 17 MM. NOTTINGHAM SCORE; 3+2+2, GRADE 2. NEGATIVE FOR LYMPHOVASCULAR INVASION.   Breast MRI January 11, 2022:  This study was obtained as the reported pathology showed lobular carcinoma. No additional sites of tumor noted.   Objective:  Physical Exam Exam conducted with a chaperone present.  Constitutional:  Appearance: Normal appearance.  Cardiovascular:  Rate and Rhythm: Normal rate and regular rhythm.  Pulses: Normal pulses.  Heart sounds: Normal heart sounds.  Pulmonary:  Effort: Pulmonary effort is normal.  Breath sounds: Normal breath sounds.  Musculoskeletal:  Cervical back: Neck supple.  Skin: General: Skin is warm and dry.  Neurological:  Mental Status: She is alert and oriented to person, place, and time.  Psychiatric:  Mood and Affect: Mood normal.  Behavior: Behavior normal.    Assessment:   Clinical stage I carcinoma of the right breast, subareolar.  Plan:   The negative MRI is encouraging.  Options for management were reviewed: 1) lumpectomy with loss of the nipple areolar complex followed by radiation with sentinel node biopsy based on the rapid change over the past 6 months versus 2) mastectomy with or without immediate reconstruction.. Equal long-term results. Opportunity for second opinion and plastic surgery referral reviewed.  At this time, the patient is interested in proceeding with wide excision and sentinel node biopsy with holding mastectomy with or without reconstruction if more extensive tumor is identified. Reviewed that this is very unlikely based on concordance between clinical exam, ultrasound, mammography and MRI.  Receptor status is still pending, but in anticipate this will not change treatment options.   This note is partially prepared by Karie Fetch, RN, acting as a scribe in the presence of Dr. Hervey Ard, MD. . The documentation  recorded by the scribe accurately reflects the service I personally performed and the decisions made by me.   Robert Bellow, MD FACS  Electronically signed by Mayer Masker

## 2022-01-17 ENCOUNTER — Other Ambulatory Visit: Payer: Self-pay | Admitting: General Surgery

## 2022-01-17 DIAGNOSIS — C50911 Malignant neoplasm of unspecified site of right female breast: Secondary | ICD-10-CM

## 2022-01-18 ENCOUNTER — Other Ambulatory Visit: Payer: Self-pay

## 2022-01-18 ENCOUNTER — Encounter
Admission: RE | Admit: 2022-01-18 | Discharge: 2022-01-18 | Disposition: A | Discharge status: Home / Self Care | Payer: Medicare Other | Source: Ambulatory Visit | Attending: General Surgery | Admitting: General Surgery

## 2022-01-18 VITALS — Ht 66.0 in | Wt 187.0 lb

## 2022-01-18 DIAGNOSIS — Z01812 Encounter for preprocedural laboratory examination: Secondary | ICD-10-CM

## 2022-01-18 DIAGNOSIS — I10 Essential (primary) hypertension: Secondary | ICD-10-CM

## 2022-01-18 DIAGNOSIS — Z79899 Other long term (current) drug therapy: Secondary | ICD-10-CM

## 2022-01-18 DIAGNOSIS — Z01810 Encounter for preprocedural cardiovascular examination: Secondary | ICD-10-CM

## 2022-01-18 NOTE — Patient Instructions (Addendum)
Your procedure is scheduled on: January 21, 2022 FRIDAY Report to the Registration Desk on the 1st floor of the [facility] AT 7:45 AM  FOR NUCLEAR MED  REMEMBER: Instructions that are not followed completely may result in serious medical risk, up to and including death; or upon the discretion of your surgeon and anesthesiologist your surgery may need to be rescheduled.  Do not eat food after midnight the night before surgery.  No gum chewing, lozengers or hard candies.  You may however, drink CLEAR liquids up to 2 hours before you are scheduled to arrive for your surgery. Do not drink anything within 2 hours of your scheduled arrival time.  Clear liquids include: - water  - apple juice without pulp - gatorade (not RED colors) - black coffee or tea (Do NOT add milk or creamers to the coffee or tea) Do NOT drink anything that is not on this list.  TAKE THESE MEDICATIONS THE MORNING OF SURGERY WITH A SIP OF WATER: ATORVASTATIN  APPLY EMLA CREAM  TO AREOLA 1 HR PRIOR TO ARRIVAL TO DAY SURGERY COVER WITH SARAN WRAP.  One week prior to surgery: Stop Anti-inflammatories (NSAIDS) such as Advil, Aleve, Ibuprofen, Motrin, Naproxen, Naprosyn and Aspirin based products such as Excedrin, Goodys Powder, BC Powder. Stop ANY OVER THE COUNTER supplements until after surgery. You may however, continue to take Tylenol if needed for pain up until the day of surgery.  No Alcohol for 24 hours before or after surgery.  No Smoking including e-cigarettes for 24 hours prior to surgery.  No chewable tobacco products for at least 6 hours prior to surgery.  No nicotine patches on the day of surgery.  Do not use any "recreational" drugs for at least a week prior to your surgery.  Please be advised that the combination of cocaine and anesthesia may have negative outcomes, up to and including death. If you test positive for cocaine, your surgery will be cancelled.  On the morning of surgery brush your  teeth with toothpaste and water, you may rinse your mouth with mouthwash if you wish. Do not swallow any toothpaste or mouthwash.  Use CHG Soap as directed on instruction sheet.  Do not wear jewelry, make-up, hairpins, clips or nail polish.  Do not wear lotions, powders, or perfumes OR DEODORANT   Do not shave body from the neck down 48 hours prior to surgery just in case you cut yourself which could leave a site for infection.  Also, freshly shaved skin may become irritated if using the CHG soap.  Contact lenses, hearing aids and dentures may not be worn into surgery.  Do not bring valuables to the hospital. [facility] is not responsible for any missing/lost belongings or valuables.   Notify your doctor if there is any change in your medical condition (cold, fever, infection).  Wear comfortable clothing (specific to your surgery type) to the hospital.  After surgery, you can help prevent lung complications by doing breathing exercises.  Take deep breaths and cough every 1-2 hours. Your doctor may order a device called an Incentive Spirometer to help you take deep breaths. When coughing or sneezing, hold a pillow firmly against your incision with both hands. This is called "splinting." Doing this helps protect your incision. It also decreases belly discomfort.   If you are being discharged the day of surgery, you will not be allowed to drive home. You will need a responsible adult (18 years or older) to drive you home and stay  with you that night.   If you are taking public transportation, you will need to have a responsible adult (18 years or older) with you. Please confirm with your physician that it is acceptable to use public transportation.   Please call the [facility] at [phone] if you have any questions about these instructions.  Surgery Visitation Policy:  Patients undergoing a surgery or procedure may have two family members or support persons  with them as long as the person is not COVID-19 positive or experiencing its symptoms.

## 2022-01-19 ENCOUNTER — Encounter: Payer: Self-pay | Admitting: Urgent Care

## 2022-01-19 ENCOUNTER — Encounter
Admission: RE | Admit: 2022-01-19 | Discharge: 2022-01-19 | Disposition: A | Discharge status: Home / Self Care | Payer: Medicare Other | Source: Ambulatory Visit | Attending: General Surgery | Admitting: General Surgery

## 2022-01-19 DIAGNOSIS — I10 Essential (primary) hypertension: Secondary | ICD-10-CM | POA: Diagnosis not present

## 2022-01-19 DIAGNOSIS — Z01810 Encounter for preprocedural cardiovascular examination: Secondary | ICD-10-CM | POA: Diagnosis not present

## 2022-01-19 DIAGNOSIS — Z79899 Other long term (current) drug therapy: Secondary | ICD-10-CM | POA: Insufficient documentation

## 2022-01-19 DIAGNOSIS — Z01818 Encounter for other preprocedural examination: Secondary | ICD-10-CM | POA: Diagnosis not present

## 2022-01-19 DIAGNOSIS — Z01812 Encounter for preprocedural laboratory examination: Secondary | ICD-10-CM

## 2022-01-19 LAB — BASIC METABOLIC PANEL
Anion gap: 10 (ref 5–15)
BUN: 20 mg/dL (ref 8–23)
CO2: 27 mmol/L (ref 22–32)
Calcium: 9.9 mg/dL (ref 8.9–10.3)
Chloride: 101 mmol/L (ref 98–111)
Creatinine, Ser: 0.85 mg/dL (ref 0.44–1.00)
GFR, Estimated: >60 mL/min (ref >60)
Glucose, Bld: 96 mg/dL (ref 70–99)
Potassium: 4 mmol/L (ref 3.5–5.1)
Sodium: 138 mmol/L (ref 135–145)

## 2022-01-20 MED ORDER — FAMOTIDINE 20 MG PO TABS: 20.0000 mg | ORAL_TABLET | Freq: Once | ORAL | Status: AC

## 2022-01-20 MED ORDER — CHLORHEXIDINE GLUCONATE CLOTH 2 % EX PADS
6.0000 | MEDICATED_PAD | Freq: Once | CUTANEOUS | Status: AC
  Administered 2022-01-21: 6 via TOPICAL

## 2022-01-21 ENCOUNTER — Encounter: Payer: Self-pay | Admitting: General Surgery

## 2022-01-21 ENCOUNTER — Encounter
Admission: RE | Disposition: A | Discharge status: Home / Self Care | Payer: Self-pay | Source: Home / Self Care | Attending: General Surgery

## 2022-01-21 ENCOUNTER — Ambulatory Visit: Payer: Medicare Other | Admitting: Certified Registered Nurse Anesthetist

## 2022-01-21 ENCOUNTER — Other Ambulatory Visit: Payer: Self-pay

## 2022-01-21 ENCOUNTER — Ambulatory Visit
Admission: RE | Admit: 2022-01-21 | Discharge: 2022-01-21 | Disposition: A | Discharge status: Home / Self Care | Payer: Medicare Other | Source: Ambulatory Visit | Attending: General Surgery | Admitting: General Surgery

## 2022-01-21 ENCOUNTER — Ambulatory Visit
Admission: RE | Admit: 2022-01-21 | Discharge: 2022-01-21 | Disposition: A | Discharge status: Home / Self Care | Payer: Medicare Other | Attending: General Surgery | Admitting: General Surgery

## 2022-01-21 DIAGNOSIS — Z87891 Personal history of nicotine dependence: Secondary | ICD-10-CM | POA: Diagnosis not present

## 2022-01-21 DIAGNOSIS — C50911 Malignant neoplasm of unspecified site of right female breast: Secondary | ICD-10-CM | POA: Diagnosis not present

## 2022-01-21 DIAGNOSIS — R928 Other abnormal and inconclusive findings on diagnostic imaging of breast: Secondary | ICD-10-CM | POA: Diagnosis not present

## 2022-01-21 DIAGNOSIS — E785 Hyperlipidemia, unspecified: Secondary | ICD-10-CM | POA: Diagnosis not present

## 2022-01-21 DIAGNOSIS — K219 Gastro-esophageal reflux disease without esophagitis: Secondary | ICD-10-CM | POA: Diagnosis not present

## 2022-01-21 DIAGNOSIS — C50011 Malignant neoplasm of nipple and areola, right female breast: Secondary | ICD-10-CM | POA: Diagnosis not present

## 2022-01-21 DIAGNOSIS — C50811 Malignant neoplasm of overlapping sites of right female breast: Secondary | ICD-10-CM | POA: Diagnosis not present

## 2022-01-21 DIAGNOSIS — I10 Essential (primary) hypertension: Secondary | ICD-10-CM | POA: Insufficient documentation

## 2022-01-21 HISTORY — PX: BREAST LUMPECTOMY WITH SENTINEL LYMPH NODE BIOPSY: SHX5597

## 2022-01-21 SURGERY — BREAST LUMPECTOMY WITH SENTINEL LYMPH NODE BX
Anesthesia: General | Site: Breast | Laterality: Right

## 2022-01-21 MED ORDER — FAMOTIDINE 20 MG PO TABS
ORAL_TABLET | ORAL | Status: AC
  Administered 2022-01-21: 20 mg via ORAL
  Filled 2022-01-21: qty 1

## 2022-01-21 MED ORDER — GLYCOPYRROLATE 0.2 MG/ML IJ SOLN
INTRAMUSCULAR | Status: AC
  Filled 2022-01-21: qty 1

## 2022-01-21 MED ORDER — ORAL CARE MOUTH RINSE: 15.0000 mL | Freq: Once | OROMUCOSAL | Status: AC

## 2022-01-21 MED ORDER — BUPIVACAINE-EPINEPHRINE (PF) 0.5% -1:200000 IJ SOLN
INTRAMUSCULAR | Status: AC
  Filled 2022-01-21: qty 30

## 2022-01-21 MED ORDER — ONDANSETRON HCL 4 MG/2ML IJ SOLN: 4.0000 mg | Freq: Once | INTRAMUSCULAR | Status: DC | PRN

## 2022-01-21 MED ORDER — FENTANYL CITRATE (PF) 100 MCG/2ML IJ SOLN
INTRAMUSCULAR | Status: DC | PRN
  Administered 2022-01-21: 50 ug via INTRAVENOUS
  Administered 2022-01-21 (×2): 25 ug via INTRAVENOUS

## 2022-01-21 MED ORDER — ACETAMINOPHEN 10 MG/ML IV SOLN
INTRAVENOUS | Status: AC
  Filled 2022-01-21: qty 100

## 2022-01-21 MED ORDER — LIDOCAINE HCL (CARDIAC) PF 100 MG/5ML IV SOSY
PREFILLED_SYRINGE | INTRAVENOUS | Status: DC | PRN
  Administered 2022-01-21: 100 mg via INTRAVENOUS

## 2022-01-21 MED ORDER — BUPIVACAINE-EPINEPHRINE (PF) 0.5% -1:200000 IJ SOLN
INTRAMUSCULAR | Status: DC | PRN
  Administered 2022-01-21: 10 mL

## 2022-01-21 MED ORDER — TECHNETIUM TC 99M TILMANOCEPT KIT
1.0000 | PACK | Freq: Once | INTRAVENOUS | Status: AC | PRN
  Administered 2022-01-21: 1.03 via INTRADERMAL

## 2022-01-21 MED ORDER — LACTATED RINGERS IV SOLN: INTRAVENOUS | Status: DC

## 2022-01-21 MED ORDER — FENTANYL CITRATE (PF) 100 MCG/2ML IJ SOLN
INTRAMUSCULAR | Status: AC
  Administered 2022-01-21: 25 ug via INTRAVENOUS
  Filled 2022-01-21: qty 2

## 2022-01-21 MED ORDER — DEXAMETHASONE SODIUM PHOSPHATE 10 MG/ML IJ SOLN
INTRAMUSCULAR | Status: DC | PRN
  Administered 2022-01-21: 10 mg via INTRAVENOUS

## 2022-01-21 MED ORDER — HYDROCODONE-ACETAMINOPHEN 5-325 MG PO TABS: 1.0000 | ORAL_TABLET | ORAL | 0 refills | Status: DC | PRN

## 2022-01-21 MED ORDER — PROPOFOL 10 MG/ML IV BOLUS
INTRAVENOUS | Status: AC
  Filled 2022-01-21: qty 20

## 2022-01-21 MED ORDER — CHLORHEXIDINE GLUCONATE 0.12 % MT SOLN: 15.0000 mL | Freq: Once | OROMUCOSAL | Status: AC

## 2022-01-21 MED ORDER — FENTANYL CITRATE (PF) 100 MCG/2ML IJ SOLN
INTRAMUSCULAR | Status: AC
  Filled 2022-01-21: qty 2

## 2022-01-21 MED ORDER — METHYLENE BLUE 1 % INJ SOLN
INTRAVENOUS | Status: AC
  Filled 2022-01-21: qty 10

## 2022-01-21 MED ORDER — FENTANYL CITRATE (PF) 100 MCG/2ML IJ SOLN
25.0000 ug | INTRAMUSCULAR | Status: DC | PRN
  Administered 2022-01-21 (×2): 25 ug via INTRAVENOUS

## 2022-01-21 MED ORDER — KETOROLAC TROMETHAMINE 30 MG/ML IJ SOLN
INTRAMUSCULAR | Status: DC | PRN
  Administered 2022-01-21: 15 mg via INTRAVENOUS

## 2022-01-21 MED ORDER — PROPOFOL 10 MG/ML IV BOLUS
INTRAVENOUS | Status: DC | PRN
  Administered 2022-01-21: 180 mg via INTRAVENOUS

## 2022-01-21 MED ORDER — EPHEDRINE SULFATE (PRESSORS) 50 MG/ML IJ SOLN
INTRAMUSCULAR | Status: DC | PRN
  Administered 2022-01-21 (×2): 5 mg via INTRAVENOUS

## 2022-01-21 MED ORDER — ACETAMINOPHEN 10 MG/ML IV SOLN
INTRAVENOUS | Status: DC | PRN
  Administered 2022-01-21: 1000 mg via INTRAVENOUS

## 2022-01-21 MED ORDER — ONDANSETRON HCL 4 MG/2ML IJ SOLN
INTRAMUSCULAR | Status: DC | PRN
  Administered 2022-01-21: 4 mg via INTRAVENOUS

## 2022-01-21 MED ORDER — EPHEDRINE 5 MG/ML INJ
INTRAVENOUS | Status: AC
  Filled 2022-01-21: qty 5

## 2022-01-21 MED ORDER — GLYCOPYRROLATE 0.2 MG/ML IJ SOLN
INTRAMUSCULAR | Status: DC | PRN
  Administered 2022-01-21: .2 mg via INTRAVENOUS

## 2022-01-21 MED ORDER — CHLORHEXIDINE GLUCONATE 0.12 % MT SOLN
OROMUCOSAL | Status: AC
  Administered 2022-01-21: 15 mL via OROMUCOSAL
  Filled 2022-01-21: qty 15

## 2022-01-21 SURGICAL SUPPLY — 60 items
APL PRP STRL LF DISP 70% ISPRP (MISCELLANEOUS) ×1
BLADE BOVIE TIP EXT 4 (BLADE) IMPLANT
BLADE SURG 15 STRL SS SAFETY (BLADE) ×2 IMPLANT
BRA SURGICAL XL LF (MISCELLANEOUS) IMPLANT
BULB RESERV EVAC DRAIN JP 100C (MISCELLANEOUS) IMPLANT
CHLORAPREP W/TINT 26 (MISCELLANEOUS) ×1 IMPLANT
CNTNR SPEC 2.5X3XGRAD LEK (MISCELLANEOUS)
CONT SPEC 4OZ STER OR WHT (MISCELLANEOUS)
CONT SPEC 4OZ STRL OR WHT (MISCELLANEOUS)
CONTAINER SPEC 2.5X3XGRAD LEK (MISCELLANEOUS) IMPLANT
COVER PROBE FLX POLY STRL (MISCELLANEOUS) ×1 IMPLANT
COVER PROBE GAMMA FINDER SLV (MISCELLANEOUS) IMPLANT
DEVICE DUBIN SPECIMEN MAMMOGRA (MISCELLANEOUS) ×1 IMPLANT
DRAIN CHANNEL JP 15F RND 16 (MISCELLANEOUS) IMPLANT
DRAPE LAPAROTOMY TRNSV 106X77 (MISCELLANEOUS) ×1 IMPLANT
DRSG GAUZE FLUFF 36X18 (GAUZE/BANDAGES/DRESSINGS) ×2 IMPLANT
DRSG TELFA 3X8 NADH STRL (GAUZE/BANDAGES/DRESSINGS) ×1 IMPLANT
ELECT CAUTERY BLADE TIP 2.5 (TIP) ×1
ELECT REM PT RETURN 9FT ADLT (ELECTROSURGICAL) ×1
ELECTRODE CAUTERY BLDE TIP 2.5 (TIP) ×1 IMPLANT
ELECTRODE REM PT RTRN 9FT ADLT (ELECTROSURGICAL) ×1 IMPLANT
GAUZE 4X4 16PLY ~~LOC~~+RFID DBL (SPONGE) ×1 IMPLANT
GLOVE BIO SURGEON STRL SZ7.5 (GLOVE) ×1 IMPLANT
GLOVE SURG UNDER LTX SZ8 (GLOVE) ×1 IMPLANT
GOWN STRL REUS W/ TWL LRG LVL3 (GOWN DISPOSABLE) ×2 IMPLANT
GOWN STRL REUS W/TWL LRG LVL3 (GOWN DISPOSABLE) ×2
KIT TURNOVER KIT A (KITS) ×1 IMPLANT
LABEL OR SOLS (LABEL) ×1 IMPLANT
MANIFOLD NEPTUNE II (INSTRUMENTS) ×1 IMPLANT
MARGIN MAP 10MM (MISCELLANEOUS) ×1 IMPLANT
NDL HYPO 25X1 1.5 SAFETY (NEEDLE) ×2 IMPLANT
NDL SPNL 20GX3.5 QUINCKE YW (NEEDLE) IMPLANT
NEEDLE HYPO 22GX1.5 SAFETY (NEEDLE) ×1 IMPLANT
NEEDLE HYPO 25X1 1.5 SAFETY (NEEDLE) ×2 IMPLANT
NEEDLE SPNL 20GX3.5 QUINCKE YW (NEEDLE) IMPLANT
PACK BASIN MINOR ARMC (MISCELLANEOUS) ×1 IMPLANT
RETRACTOR RING XSMALL (MISCELLANEOUS) IMPLANT
RTRCTR WOUND ALEXIS 13CM XS SH (MISCELLANEOUS)
SHEARS FOC LG CVD HARMONIC 17C (MISCELLANEOUS) IMPLANT
SHEARS HARMONIC 9CM CVD (BLADE) IMPLANT
STRIP CLOSURE SKIN 1/2X4 (GAUZE/BANDAGES/DRESSINGS) ×1 IMPLANT
SUT ETHILON 3-0 FS-10 30 BLK (SUTURE)
SUT SILK 2 0 (SUTURE)
SUT SILK 2-0 18XBRD TIE 12 (SUTURE) ×1 IMPLANT
SUT VIC AB 2-0 CT1 27 (SUTURE) ×2
SUT VIC AB 2-0 CT1 TAPERPNT 27 (SUTURE) ×2 IMPLANT
SUT VIC AB 3-0 54X BRD REEL (SUTURE) ×1 IMPLANT
SUT VIC AB 3-0 BRD 54 (SUTURE) ×1
SUT VIC AB 3-0 SH 27 (SUTURE)
SUT VIC AB 3-0 SH 27X BRD (SUTURE) ×2 IMPLANT
SUT VIC AB 4-0 FS2 27 (SUTURE) ×2 IMPLANT
SUTURE EHLN 3-0 FS-10 30 BLK (SUTURE) ×1 IMPLANT
SWABSTK COMLB BENZOIN TINCTURE (MISCELLANEOUS) ×1 IMPLANT
SYR 10ML LL (SYRINGE) ×1 IMPLANT
SYR BULB IRRIG 60ML STRL (SYRINGE) ×1 IMPLANT
TAPE TRANSPORE STRL 2 31045 (GAUZE/BANDAGES/DRESSINGS) ×1 IMPLANT
TRAP FLUID SMOKE EVACUATOR (MISCELLANEOUS) ×1 IMPLANT
TRAP NEPTUNE SPECIMEN COLLECT (MISCELLANEOUS) ×1 IMPLANT
WATER STERILE IRR 1000ML POUR (IV SOLUTION) ×1 IMPLANT
WATER STERILE IRR 500ML POUR (IV SOLUTION) ×1 IMPLANT

## 2022-01-21 NOTE — Anesthesia Preprocedure Evaluation (Signed)
Anesthesia Evaluation  Patient identified by MRN, date of birth, ID band Patient awake    Reviewed: Allergy & Precautions, H&P , NPO status , Patient's Chart, lab work & pertinent test results, reviewed documented beta blocker date and time   Airway Mallampati: II  TM Distance: >3 FB Neck ROM: full    Dental  (+) Teeth Intact   Pulmonary neg pulmonary ROS, former smoker,    Pulmonary exam normal        Cardiovascular Exercise Tolerance: Good hypertension, On Medications negative cardio ROS Normal cardiovascular exam Rate:Normal     Neuro/Psych negative neurological ROS  negative psych ROS   GI/Hepatic Neg liver ROS, GERD  Medicated,  Endo/Other  negative endocrine ROS  Renal/GU negative Renal ROS  negative genitourinary   Musculoskeletal   Abdominal   Peds  Hematology negative hematology ROS (+)   Anesthesia Other Findings   Reproductive/Obstetrics negative OB ROS                             Anesthesia Physical Anesthesia Plan  ASA: 3  Anesthesia Plan: General LMA   Post-op Pain Management:    Induction:   PONV Risk Score and Plan:   Airway Management Planned:   Additional Equipment:   Intra-op Plan:   Post-operative Plan:   Informed Consent: I have reviewed the patients History and Physical, chart, labs and discussed the procedure including the risks, benefits and alternatives for the proposed anesthesia with the patient or authorized representative who has indicated his/her understanding and acceptance.       Plan Discussed with: CRNA  Anesthesia Plan Comments:         Anesthesia Quick Evaluation

## 2022-01-21 NOTE — Discharge Instructions (Signed)

## 2022-01-21 NOTE — Op Note (Signed)
Preoperative diagnosis: Invasive lobular carcinoma of the right breast.  Desire for breast conservation.  Postoperative diagnosis: Same.  Operative procedure: Ultrasound-guided resection of the right breast cancer, sentinel node biopsy.  Operating surgeon: Hervey Ard, MD.  Anesthesia: General by LMA, Marcaine 0.5% with 1: 200,000 units of epinephrine, 30 cc.  Estimated blood loss: 5 cc.  Clinical note: This 81 year old woman first noticed a mass in her right breast in early 2023.  Mammogram at that time was reported as "normal".  The patient noted the mass continued to enlarge in size and reevaluation showed a significant increase in volume.  Recent biopsy showed evidence of invasive lobular carcinoma.  MRI showed no additional lesions.  She is felt to be a candidate for breast conservation.  Due to the significant change in size over time it was elected to complete a sentinel node biopsy today.  She was injected with technetium sulfur colloid prior to presentation to the operating theater.  Antibiotics were not indicated.  SCD stockings for DVT prevention.  The patient was injected with technetium sulfur colloid prior to presentation to the operating theater.  Operative note: With the patient under adequate general anesthesia the area of the right breast chest and neck was cleansed with ChloraPrep and draped.  Local anesthesia was infiltrated for postoperative analgesia.  With the subareolar location of the tumor it was elected not to use methylene blue for a second tracer.  Ultrasound was used to mark out the volumes of the known tumor site.  A 6 cm wide 12 cm long transversely oriented ellipse of skin including the nipple areolar complex was resected to the level below the tumor.  The specimen was orientated and specimen radiograph previously placed clip in place.  The specimen was sent fresh to pathology for review.  Attention was turned to the axilla.  The node seeker device showed areas of  increased uptake in the lower level of the axilla.  Local anesthesia was infiltrated.  A transverse incision was made and carried down through skin and subcutaneous tissue.  The axillary envelope was opened.  2 sentinel nodes were identified the first with counts of 8000 the second with counts of 600.  1 small 5 mm nonsentinel node was sent separately for routine review.  With good hemostasis the wound was closed in layers starting with the axillary envelope and then the adipose layers with 2-0 Vicryl figure-of-eight sutures.  The skin was closed with a running 4-0 Vicryl subcuticular suture.  Report from pathology showed margins grossly negative.  The wound was closed with multiple layers of 2-0 Vicryl figure-of-eight sutures.  The adipose layer was approximated with interrupted 2-0 Vicryl's sutures.  The skin was closed with a running 4-0 Vicryl subcuticular suture.  Benzoin and Steri-Strips followed by Telfa, fluff gauze and a compressive wrap were applied.  The patient tolerated procedure well and was taken to PACU in stable condition.

## 2022-01-21 NOTE — Anesthesia Procedure Notes (Signed)
Procedure Name: LMA Insertion Date/Time: 01/21/2022 9:28 AM  Performed by: Demetrius Charity, CRNAPre-anesthesia Checklist: Patient identified, Patient being monitored, Timeout performed, Emergency Drugs available and Suction available Patient Re-evaluated:Patient Re-evaluated prior to induction Oxygen Delivery Method: Circle system utilized Preoxygenation: Pre-oxygenation with 100% oxygen Induction Type: IV induction Ventilation: Mask ventilation without difficulty LMA: LMA inserted LMA Size: 4.0 Tube type: Oral Number of attempts: 1 Placement Confirmation: positive ETCO2 and breath sounds checked- equal and bilateral Tube secured with: Tape Dental Injury: Teeth and Oropharynx as per pre-operative assessment

## 2022-01-21 NOTE — Transfer of Care (Signed)
Immediate Anesthesia Transfer of Care Note  Patient: Sabrina Wilson  Procedure(s) Performed: BREAST LUMPECTOMY WITH SENTINEL LYMPH NODE BX (Right: Breast)  Patient Location: PACU  Anesthesia Type:General  Level of Consciousness: awake and alert   Airway & Oxygen Therapy: Patient Spontanous Breathing and Patient connected to face mask oxygen  Post-op Assessment: Report given to RN and Post -op Vital signs reviewed and stable  Post vital signs: Reviewed and stable  Last Vitals:  Vitals Value Taken Time  BP 147/69 01/21/22 1041  Temp 36.2 C 01/21/22 1041  Pulse 87 01/21/22 1044  Resp 21 01/21/22 1044  SpO2 100 % 01/21/22 1044  Vitals shown include unvalidated device data.  Last Pain:  Vitals:   01/21/22 1041  TempSrc:   PainSc: Asleep         Complications: No notable events documented.

## 2022-01-21 NOTE — H&P (Signed)
Sabrina Wilson [id number] [birth year]     HPI: Recently identified right breast cancer. Desires breast conservation.    Medications Prior to Admission  Medication Sig Dispense Refill Last Dose   atorvastatin (LIPITOR) 10 MG tablet TAKE ONE TABLET BY MOUTH DAILY 90 tablet 1    azelastine (ASTELIN) 0.1 % nasal spray Place 2 sprays into both nostrils 2 (two) times daily. Use in each nostril as directed (Patient not taking: Reported on 01/18/2022) 30 mL 12    Calcium Carbonate-Vit D-Min (CALCIUM 1200 PO) Take by mouth daily.      clobetasol (TEMOVATE) 0.05 % external solution Apply to scalp once daily as needed for itch. Avoid applying to face, groin, and axilla. Use as directed. Long-term use can cause thinning of the skin. (Patient not taking: Reported on 01/18/2022) 50 mL 2    clobetasol ointment (TEMOVATE) 7.06 % Apply 1 application topically 2 (two) times daily. To affected areas of rash for up to 2-3 weeks. Avoid applying to face, groin, and axilla. Use as directed. Long-term use can cause thinning of the skin. 45 g 1    hydrochlorothiazide (HYDRODIURIL) 12.5 MG tablet TAKE ONE TABLET BY MOUTH DAILY 90 tablet 1    ketoconazole (NIZORAL) 2 % shampoo apply three times per week, massage into scalp and leave in for 10 minutes before rinsing out (Patient not taking: Reported on 01/18/2022) 120 mL 5    losartan (COZAAR) 100 MG tablet Take 100 mg by mouth daily.      mupirocin ointment (BACTROBAN) 2 % Apply 1 application topically 2 (two) times daily. (Patient not taking: Reported on 01/18/2022) 22 g 0    niacinamide 500 MG tablet Take 500 mg by mouth 2 (two) times daily.      Omega-3 Fatty Acids (FISH OIL PO) Take by mouth daily.      Ruxolitinib Phosphate (OPZELURA) 1.5 % CREA Apply 1 application topically daily. 60 g 2    sodium fluoride (FLUORISHIELD) 1.1 % GEL dental gel Place onto teeth.      VITAMIN D, CHOLECALCIFEROL, PO Take 2,000 Units by mouth daily.      Allergies  Allergen Reactions    Sulfa Antibiotics Hives    As a child   Past Medical History:  Diagnosis Date   Actinic keratosis 02/12/2020   R lat calf (hypertrophic)   Arthritis    Cancer of sigmoid ([place]) 06/17/2016   Colon cancer ([facility]) 2009   T3,N0; s/p resection  Dr. Hulda Humphrey and chemotherapy by Dr. Cynda Acres   Diastolic dysfunction    a. 08/2020 Echo: EF 60-65%, no rwma, Gr1 DD, nl RV size/fxn. Mild MR.   Hernia of flank    History of actinic keratoses 08/11/2020   Bx proven at left lateral ankle proximal, LN2 10/08/20   History of stress test    a. 09/2020 MV: EF >65%. No ischemia/infarct. Mild Ao Ca2+ and minimal Cor Ca2+.   Hypertension    Neuropathy    Polyp at cervical os    s/p resection Dr. Sabra Heck   Skin cancer 01/22/2020   surface of an atypical verrucous squamous proliferation    Squamous cell carcinoma of skin 07/16/2020   Left lateral ankle anterior, treated with [facility] 08-11-2020   Past Surgical History:  Procedure Laterality Date   BREAST BIOPSY Right 05/2014   Dr. Dwyane Luo office-benign   BREAST SURGERY Right February 2016   Vacuum assisted biopsy for recurrent cyst, fibrocystic changes without evidence of malignancy.   CATARACT EXTRACTION W/PHACO Left 01/13/2015  Procedure: CATARACT EXTRACTION PHACO AND INTRAOCULAR LENS PLACEMENT (IOC);  Surgeon: Birder Robson, MD;  Location: [facility] ORS;  Service: Ophthalmology;  Laterality: Left;  [place]  1:02.9 AP   19.2 CDE  12.06 casette lot #  [id number]   CATARACT EXTRACTION W/PHACO Right 02/03/2015   Procedure: CATARACT EXTRACTION PHACO AND INTRAOCULAR LENS PLACEMENT (IOC);  Surgeon: Birder Robson, MD;  Location: [facility] ORS;  Service: Ophthalmology;  Laterality: Right;  [id number]:53 ap46.5 [id number]   COLECTOMY     COLONOSCOPY  2015   Dr Candace Cruise   COLONOSCOPY WITH PROPOFOL N/A 09/13/2017   Procedure: COLONOSCOPY WITH PROPOFOL;  Surgeon: Robert Bellow, MD;  Location: [facility] ENDOSCOPY;  Service: Endoscopy;  Laterality: N/A;   COLONOSCOPY WITH PROPOFOL N/A 11/08/2017    Procedure: COLONOSCOPY WITH PROPOFOL;  Surgeon: Robert Bellow, MD;  Location: [facility] ENDOSCOPY;  Service: Endoscopy;  Laterality: N/A;   colostomy reversal     DILATION AND CURETTAGE OF UTERUS  2014   Dr. Sabra Heck, benign per pt   EYE SURGERY     HERNIA REPAIR  07/13/12   ventral    SKIN CANCER EXCISION     TONSILLECTOMY     Social History   Socioeconomic History   Marital status: Married    Spouse name: Not on file   Number of children: Not on file   Years of education: Not on file   Highest education level: Not on file  Occupational History   Not on file  Tobacco Use   Smoking status: Former    Types: Cigarettes    Quit date: 10/12/2007    Years since quitting: 14.2   Smokeless tobacco: Never  Vaping Use   Vaping Use: Never used  Substance and Sexual Activity   Alcohol use: Yes    Alcohol/week: 1.0 standard drink of alcohol    Types: 1 Standard drinks or equivalent per week    Comment: with dinner GLASS OF WINE EACH DAY   Drug use: No   Sexual activity: Not on file  Other Topics Concern   Not on file  Social History Narrative   Lives in [place] with husband. 2 children, son lives nearby.      Diet - regular      Exercise - none   Social Determinants of Health   Financial Resource Strain: Low Risk  (11/13/2018)   Overall Financial Resource Strain (CARDIA)    Difficulty of Paying Living Expenses: Not hard at all  Food Insecurity: No Food Insecurity (11/16/2020)   Hunger Vital Sign    Worried About Running Out of Food in the Last Year: Never true    Ran Out of Food in the Last Year: Never true  Transportation Needs: No Transportation Needs (11/16/2020)   PRAPARE - Hydrologist (Medical): No    Lack of Transportation (Non-Medical): No  Physical Activity: Not on file  Stress: No Stress Concern Present (11/16/2020)   [place]    Feeling of Stress : Not at all  Social  Connections: Not on file  Intimate Partner Violence: Not At Risk (11/16/2020)   Humiliation, Afraid, Rape, and Kick questionnaire    Fear of Current or Ex-Partner: No    Emotionally Abused: No    Physically Abused: No    Sexually Abused: No   Social History   Social History Narrative   Lives in [place] with husband. 2 children, son lives nearby.      Diet -  regular      Exercise - none     ROS: Negative.     PE: HEENT: Negative. Lungs: Clear. Cardio: RR.   Assessment/Plan:  Proceed with planned wide excision right breast cancer and SLN exam. [facility] 01/21/2022

## 2022-01-22 ENCOUNTER — Encounter: Payer: Self-pay | Admitting: General Surgery

## 2022-01-25 NOTE — Anesthesia Postprocedure Evaluation (Signed)
Anesthesia Post Note  Patient: Sabrina Wilson  Procedure(s) Performed: BREAST LUMPECTOMY WITH SENTINEL LYMPH NODE BX (Right: Breast)  Patient location during evaluation: PACU Anesthesia Type: General Level of consciousness: awake and alert Pain management: pain level controlled Vital Signs Assessment: post-procedure vital signs reviewed and stable Respiratory status: spontaneous breathing, nonlabored ventilation, respiratory function stable and patient connected to nasal cannula oxygen Cardiovascular status: blood pressure returned to baseline and stable Postop Assessment: no apparent nausea or vomiting Anesthetic complications: no   No notable events documented.   Last Vitals:  Vitals:   01/21/22 1211 01/21/22 1250  BP: (!) 155/81 (!) 147/66  Pulse: 83 82  Resp: 16 15  Temp: (!) 36.3 C   SpO2: 95% 96%    Last Pain:  Vitals:   01/21/22 1211  TempSrc: Temporal  PainSc: [name]

## 2022-01-26 ENCOUNTER — Encounter: Payer: Self-pay | Admitting: *Deleted

## 2022-01-26 ENCOUNTER — Other Ambulatory Visit: Payer: Self-pay | Admitting: General Surgery

## 2022-01-26 ENCOUNTER — Other Ambulatory Visit: Payer: Self-pay | Admitting: Pathology

## 2022-01-26 LAB — SURGICAL PATHOLOGY

## 2022-01-26 NOTE — Progress Notes (Signed)
Dr. Bary Castilla requested oncotype order to be sent.   Patient will be referred to Dr. Jacinto Reap.  Dr. B reviewed pathology and said ok to send oncotype.    Order submitted online for surgical path [phone].  Order ID  [id number]

## 2022-01-31 ENCOUNTER — Encounter: Payer: Self-pay | Admitting: *Deleted

## 2022-01-31 NOTE — Progress Notes (Signed)
Prior auth request and clinicals faxed to [facility] fax# [phone]

## 2022-02-01 ENCOUNTER — Encounter: Payer: Self-pay | Admitting: *Deleted

## 2022-02-01 ENCOUNTER — Other Ambulatory Visit: Payer: Self-pay | Admitting: General Surgery

## 2022-02-01 DIAGNOSIS — C50911 Malignant neoplasm of unspecified site of right female breast: Secondary | ICD-10-CM

## 2022-02-01 NOTE — Progress Notes (Signed)
Referral to med onc and rad onc by Dr. Fleet Contras.   Appointments scheduled for 10/23 with Dr. Baruch Gouty and Dr. Rogue Bussing.  Appt. Details given to the patient.

## 2022-02-11 ENCOUNTER — Encounter: Payer: Self-pay | Admitting: Family Medicine

## 2022-02-11 ENCOUNTER — Ambulatory Visit (INDEPENDENT_AMBULATORY_CARE_PROVIDER_SITE_OTHER): Payer: Medicare Other | Admitting: Family Medicine

## 2022-02-11 DIAGNOSIS — I10 Essential (primary) hypertension: Secondary | ICD-10-CM | POA: Diagnosis not present

## 2022-02-11 DIAGNOSIS — C50919 Malignant neoplasm of unspecified site of unspecified female breast: Secondary | ICD-10-CM

## 2022-02-11 NOTE — Assessment & Plan Note (Addendum)
She will keep her appointments with general surgery and radiation oncology and medical oncology.

## 2022-02-11 NOTE — Assessment & Plan Note (Signed)
Improved on recheck.  Continue HCTZ 12.5 mg daily.

## 2022-02-11 NOTE — Progress Notes (Signed)
Sabrina Rumps, MD Phone: [phone]  Sabrina Wilson is a 81 y.o. female who presents today for f/u.  Breast cancer: Patient recently underwent surgery for this.  Pathology reveals invasive mammary carcinoma with DCIS with 1 lymph node involved.  She follows up again with her surgeon next week.  She has already seen him for postsurgical follow-up.  She has some soreness though its not too bad.  She notes the surgeon gave her a topical patch to put on her axillary area though getting it off was worse than the discomfort she has been dealing with.  She sees radiation oncology and medical oncology next week for her first appointments.  She notes her blood pressure is typically lower than this at home.  Social History   Tobacco Use  Smoking Status Former   Types: Cigarettes   Quit date: 10/12/2007   Years since quitting: 14.3  Smokeless Tobacco Never    Current Outpatient Medications on File Prior to Visit  Medication Sig Dispense Refill   atorvastatin (LIPITOR) 10 MG tablet TAKE ONE TABLET BY MOUTH DAILY 90 tablet 1   Calcium Carbonate-Vit D-Min (CALCIUM 1200 PO) Take by mouth daily.     clobetasol ointment (TEMOVATE) 8.09 % Apply 1 application topically 2 (two) times daily. To affected areas of rash for up to 2-3 weeks. Avoid applying to face, groin, and axilla. Use as directed. Long-term use can cause thinning of the skin. 45 g 1   hydrochlorothiazide (HYDRODIURIL) 12.5 MG tablet TAKE ONE TABLET BY MOUTH DAILY 90 tablet 1   HYDROcodone-acetaminophen (NORCO/VICODIN) 5-325 MG tablet Take 1 tablet by mouth every 4 (four) hours as needed for moderate pain. 15 tablet 0   lidocaine-prilocaine (EMLA) cream Apply to areola one hour prior to arrival day of procedure. Cover with saran wrap.     losartan (COZAAR) 100 MG tablet Take 100 mg by mouth daily.     niacinamide 500 MG tablet Take 500 mg by mouth 2 (two) times daily.     Omega-3 Fatty Acids (FISH OIL PO) Take by mouth daily.      Ruxolitinib Phosphate (OPZELURA) 1.5 % CREA Apply 1 application topically daily. 60 g 2   sodium fluoride (FLUORISHIELD) 1.1 % GEL dental gel Place onto teeth.     VITAMIN D, CHOLECALCIFEROL, PO Take 2,000 Units by mouth daily.     No current facility-administered medications on file prior to visit.     ROS see history of present illness  Objective  Physical Exam Vitals:   02/11/22 1539 02/11/22 1546  BP: (!) 140/80 130/80  Pulse: 80   Temp: 98 F (36.7 C)   SpO2: 96%     BP Readings from Last 3 Encounters:  02/11/22 130/80  01/21/22 (!) 147/66  01/12/22 130/80   Wt Readings from Last 3 Encounters:  02/11/22 186 lb (84.4 kg)  01/21/22 187 lb (84.8 kg)  01/18/22 187 lb (84.8 kg)    Physical Exam Constitutional:      General: She is not in acute distress.    Appearance: She is not diaphoretic.  Cardiovascular:     Rate and Rhythm: Normal rate and regular rhythm.     Heart sounds: Normal heart sounds.  Pulmonary:     Effort: Pulmonary effort is normal.     Breath sounds: Normal breath sounds.  Neurological:     Mental Status: She is alert.      Assessment/Plan: Please see individual problem list.  Problem List Items Addressed This Visit  Hypertension (Chronic)    Improved on recheck.  Continue HCTZ 12.5 mg daily.      Invasive lobular carcinoma of breast in female [facility])    She will keep her appointments with general surgery and radiation oncology and medical oncology.       Return in about 4 months (around 06/14/2022).   Sabrina Rumps, MD [place]

## 2022-02-14 ENCOUNTER — Inpatient Hospital Stay: Payer: Medicare Other

## 2022-02-14 ENCOUNTER — Encounter: Payer: Self-pay | Admitting: Internal Medicine

## 2022-02-14 ENCOUNTER — Ambulatory Visit
Admission: RE | Admit: 2022-02-14 | Discharge: 2022-02-14 | Disposition: A | Discharge status: Home / Self Care | Payer: Medicare Other | Source: Ambulatory Visit | Attending: Radiation Oncology | Admitting: Radiation Oncology

## 2022-02-14 ENCOUNTER — Inpatient Hospital Stay: Payer: Medicare Other | Attending: Internal Medicine | Admitting: Internal Medicine

## 2022-02-14 VITALS — BP 130/80 | Ht 66.5 in | Wt 186.0 lb

## 2022-02-14 VITALS — HR 79 | Temp 97.3°F | Resp 18 | Ht 66.5 in | Wt 186.0 lb

## 2022-02-14 DIAGNOSIS — Z87891 Personal history of nicotine dependence: Secondary | ICD-10-CM | POA: Insufficient documentation

## 2022-02-14 DIAGNOSIS — Z79899 Other long term (current) drug therapy: Secondary | ICD-10-CM | POA: Insufficient documentation

## 2022-02-14 DIAGNOSIS — I10 Essential (primary) hypertension: Secondary | ICD-10-CM | POA: Insufficient documentation

## 2022-02-14 DIAGNOSIS — C50811 Malignant neoplasm of overlapping sites of right female breast: Secondary | ICD-10-CM | POA: Insufficient documentation

## 2022-02-14 DIAGNOSIS — Z85828 Personal history of other malignant neoplasm of skin: Secondary | ICD-10-CM | POA: Insufficient documentation

## 2022-02-14 DIAGNOSIS — Z808 Family history of malignant neoplasm of other organs or systems: Secondary | ICD-10-CM | POA: Diagnosis not present

## 2022-02-14 DIAGNOSIS — Z170 Estrogen receptor positive status [ER+]: Secondary | ICD-10-CM | POA: Diagnosis not present

## 2022-02-14 DIAGNOSIS — Z8719 Personal history of other diseases of the digestive system: Secondary | ICD-10-CM | POA: Insufficient documentation

## 2022-02-14 DIAGNOSIS — G620 Drug-induced polyneuropathy: Secondary | ICD-10-CM | POA: Insufficient documentation

## 2022-02-14 DIAGNOSIS — T451X5A Adverse effect of antineoplastic and immunosuppressive drugs, initial encounter: Secondary | ICD-10-CM | POA: Diagnosis not present

## 2022-02-14 DIAGNOSIS — Z800 Family history of malignant neoplasm of digestive organs: Secondary | ICD-10-CM | POA: Diagnosis not present

## 2022-02-14 DIAGNOSIS — Z85038 Personal history of other malignant neoplasm of large intestine: Secondary | ICD-10-CM | POA: Insufficient documentation

## 2022-02-14 DIAGNOSIS — R7303 Prediabetes: Secondary | ICD-10-CM | POA: Insufficient documentation

## 2022-02-14 DIAGNOSIS — C50911 Malignant neoplasm of unspecified site of right female breast: Secondary | ICD-10-CM | POA: Diagnosis not present

## 2022-02-14 LAB — COMPREHENSIVE METABOLIC PANEL
ALT: 20 U/L (ref 0–44)
AST: 29 U/L (ref 15–41)
Albumin: 4.4 g/dL (ref 3.5–5.0)
Alkaline Phosphatase: 65 U/L (ref 38–126)
Anion gap: 11 (ref 5–15)
BUN: 20 mg/dL (ref 8–23)
CO2: 27 mmol/L (ref 22–32)
Calcium: 10 mg/dL (ref 8.9–10.3)
Chloride: 96 mmol/L — ABNORMAL LOW (ref 98–111)
Creatinine, Ser: 0.84 mg/dL (ref 0.44–1.00)
GFR, Estimated: >60 mL/min (ref >60)
Glucose, Bld: 104 mg/dL — ABNORMAL HIGH (ref 70–99)
Potassium: 3.5 mmol/L (ref 3.5–5.1)
Sodium: 134 mmol/L — ABNORMAL LOW (ref 135–145)
Total Bilirubin: 0.7 mg/dL (ref 0.3–1.2)
Total Protein: 8.1 g/dL (ref 6.5–8.1)

## 2022-02-14 LAB — CBC WITH DIFFERENTIAL/PLATELET
Abs Immature Granulocytes: 0.03 10*3/uL (ref 0.00–0.07)
Basophils Absolute: 0.1 10*3/uL (ref 0.0–0.1)
Basophils Relative: 1 %
Eosinophils Absolute: 0.4 10*3/uL (ref 0.0–0.5)
Eosinophils Relative: 5 %
HCT: 42.5 % (ref 36.0–46.0)
Hemoglobin: 13.7 g/dL (ref 12.0–15.0)
Immature Granulocytes: 0 %
Lymphocytes Relative: 26 %
Lymphs Abs: 1.9 10*3/uL (ref 0.7–4.0)
MCH: 33.1 pg (ref 26.0–34.0)
MCHC: 32.2 g/dL (ref 30.0–36.0)
MCV: 102.7 fL — ABNORMAL HIGH (ref 80.0–100.0)
Monocytes Absolute: 0.6 10*3/uL (ref 0.1–1.0)
Monocytes Relative: 8 %
Neutro Abs: 4.3 10*3/uL (ref 1.7–7.7)
Neutrophils Relative %: 60 %
Platelets: 229 10*3/uL (ref 150–400)
RBC: 4.14 MIL/uL (ref 3.87–5.11)
RDW: 13.4 % (ref 11.5–15.5)
WBC: 7.3 10*3/uL (ref 4.0–10.5)
nRBC: 0 % (ref 0.0–0.2)

## 2022-02-14 MED ORDER — ONDANSETRON HCL 8 MG PO TABS: ORAL_TABLET | ORAL | 1 refills | Status: DC

## 2022-02-14 MED ORDER — PROCHLORPERAZINE MALEATE 10 MG PO TABS: 10.0000 mg | ORAL_TABLET | Freq: Four times a day (QID) | ORAL | 1 refills | Status: DC | PRN

## 2022-02-14 MED ORDER — DEXAMETHASONE 4 MG PO TABS: 4.0000 mg | ORAL_TABLET | Freq: Two times a day (BID) | ORAL | 0 refills | Status: DC

## 2022-02-14 NOTE — Progress Notes (Signed)
[facility] [place] [facility] NOTE  Patient Care Team: Leone Haven, MD as PCP - General (Family Medicine) Kate Sable, MD as PCP - Cardiology (Cardiology) Bary Castilla Forest Gleason, MD as Consulting Physician (General Surgery) Dallas Schimke, MD (Internal Medicine) Daiva Huge, RN as Oncology Nurse Navigator  CHIEF COMPLAINTS/PURPOSE OF CONSULTATION: Breast cancer  #  Oncology History Overview Note  # 2009- Sigmoid colon cancer stage II (T3 N0 4 lymph nodes sampled M0 stage II high risk there was obstruction and perforation abscess; Dr.Hern), workup included a low CEA preoperatively and a chest x-ray and CT scan abdomen pelvis negative for metastatic disease, S/P colostomy; s/p FOLFOX x 6 months. [Dr.Gittin ]  # LATER REVERSED , K RAS  wild type B RAF not evaluated   Also initial iron deficiency with anemia thrombocytosis, high platelets considered reactive, workup includes a normal PCR for CML and a negative [id number] mutation  DIAGNOSIS:  A. BREAST, RIGHT; LUMPECTOMY:  - INVASIVE MAMMARY CARCINOMA.  - DUCTAL CARCINOMA IN SITU (DCIS).  - SEE CANCER SUMMARY BELOW.  - BIOPSY SITE CHANGE WITH CLIP (2).  - FIBROUS TISSUE WITH CYST FORMATION AND FLORID DUCTAL HYPERPLASIA.  - SKIN AND NIPPLE NEGATIVE FOR MALIGNANCY.  - VASCULAR CALCIFICATION.   B. SENTINEL LYMPH NODES 1 AND 2, RIGHT AXILLA; EXCISION:  - METASTATIC CARCINOMA INVOLVES ONE OF TWO LYMPH NODES (1/2).   C. NON-SENTINEL LYMPH NODES, RIGHT AXILLA; EXCISION:  - ONE LYMPH NODE NEGATIVE FOR MALIGNANCY (0/1).   CANCER CASE SUMMARY: INVASIVE CARCINOMA OF THE BREAST  Standard(s): AJCC-UICC 8   SPECIMEN  Procedure: Lumpectomy  Specimen Laterality: Right   TUMOR  Histologic Type: Invasive carcinoma of no special type (ductal)  Histologic Grade (Nottingham Histologic Score)       Glandular (Acinar)/Tubular Differentiation: 3       Nuclear Pleomorphism: 3       Mitotic Rate: 3       Overall Grade: 3  Tumor Size:  24 mm  Tumor Focality: Single focus of invasive carcinoma  Ductal Carcinoma In Situ (DCIS): Present, nuclear grade 2-3  Tumor Extent: Not applicable  Lymphatic and/or Vascular Invasion: Present  Treatment Effect in the Breast: No known presurgical therapy   MARGINS  Margin Status for Invasive Carcinoma: All margins negative for invasive  carcinoma       Distance from closest margin: 10 mm       Specify closest margin: Superior   Margin Status for DCIS: All margins negative for DCIS       Distance from DCIS to closest margin: 10 mm       Specify closest margin: Superior   REGIONAL LYMPH NODES  Regional Lymph Node Status: Tumor present in regional lymph node(s)       Number of Lymph Nodes with Macrometastases (greater than 2 mm): 1       Number of Lymph Nodes with Micrometastases (greater than 0.2 mm to  2 mm and/or greater than 200 cells): 0       Number of Lymph Nodes with Isolated Tumor Cells (0.2 mm or less OR  200 cells or less): 0       Size of Largest Metastatic Deposit: 3 mm      Extranodal Extension: Not identified       Total Number of Lymph Nodes Examined (sentinel and non-sentinel): 3       Number of Sentinel Nodes Examined: 2   DISTANT METASTASIS  Distant Site(s) Involved, if applicable: Not applicable  PATHOLOGIC STAGE CLASSIFICATION (pTNM, AJCC 8th Edition):  Modified Classification: Not applicable  pT Category: pT2  T Suffix: Not applicable  pN Category: pN1a  N Suffix: (sn)  pM Category: Not applicable   SPECIAL STUDIES  Breast Biomarker Testing Performed on Previous Outside Biopsy:  23-250-T11  Per outside report:  Estrogen Receptor (ER) Status: POSITIVE          Percentage of cells with nuclear positivity: Greater than 90%          Average intensity of staining: Strong   Progesterone Receptor (PgR) Status: POSITIVE          Percentage of cells with nuclear positivity: 60%          Average intensity of staining: Strong   HER2 (by  immunohistochemistry): EQUIVOCAL (Score 2+)  HER2 FISH: NEGATIVE   Ki-67: Not performed   IMPRESSION: 1. Complex cystic and solid-appearing mass within the subareolar RIGHT breast, overall measuring 3.1 cm greatest dimension, the solid enhancing component at the anterior-lateral aspect of the mass measuring 2 x 1 cm, abutting the overlying skin but without evidence of involvement/enhancement of the skin. Susceptibility artifact within the mass may represent a biopsy clip (if biopsy has been performed at an outside site since the diagnostic mammogram/ultrasound of 12/28/2021) or this artifact could represent calcifications within the mass. This mass corresponds to the recent ultrasound finding of a complex irregular mass at the 11 o'clock axis of the retroareolar RIGHT breast. 2. No evidence of multifocal or multicentric disease within the RIGHT breast. 3. No evidence of contralateral disease in the LEFT breast.   RECOMMENDATION: If has not already been performed, recommend ultrasound-guided biopsy of the suspicious mass in the subareolar RIGHT breast, with preferential targeting of the anterior-lateral component of the mass which is shown to be the enhancing component on today's MRI.   BI-RADS CATEGORY  4: Suspicious.     Cancer of sigmoid ([place]) (Resolved)  Carcinoma of overlapping sites of right breast in female, estrogen receptor positive ([place])  02/14/2022 Initial Diagnosis   Carcinoma of overlapping sites of right breast in female, estrogen receptor positive ([place])   02/14/2022 Cancer Staging   Staging form: Breast, AJCC 8th Edition - Pathologic: Stage IIA (pT2, pN1, cM0, G3, ER+, PR+, HER2-) - Signed by Cammie Sickle, MD on 02/14/2022 Histologic grading system: 3 grade system   02/23/2022 -  Chemotherapy   Patient is on Treatment Plan : BREAST TC q21d        HISTORY OF PRESENTING ILLNESS:   Alone. Ambulating independently.   Sabrina Wilson 81 y.o.  female  female with no prior history of breast cancer/or malignancies has been referred to [place] for further evaluation recommendations for new diagnosis of breast cancer.   Patient states she felt an abnormal lump in the right breast.  This led to mammogram which led to diagnostic mammogram/ultrasound/followed by biopsy.  Patient also underwent breast MRI-as above.  Subsequently patient underwent a lumpectomy along with sentinel lymph node biopsy.   Patient also underwent evaluation with radiation oncology this afternoon.   Patient has mild residual neuropathy from her previous chemotherapy from oxaliplatin.  However, no falls.   Review of Systems  Constitutional:  Negative for chills, diaphoresis, fever, malaise/fatigue and weight loss.  HENT:  Negative for nosebleeds and sore throat.   Eyes:  Negative for double vision.  Respiratory:  Negative for cough, hemoptysis, sputum production, shortness of breath and wheezing.   Cardiovascular:  Negative for chest pain, palpitations, orthopnea  and leg swelling.  Gastrointestinal:  Negative for abdominal pain, blood in stool, constipation, diarrhea, heartburn, melena, nausea and vomiting.  Genitourinary:  Negative for dysuria, frequency and urgency.  Musculoskeletal:  Negative for back pain and joint pain.  Skin: Negative.  Negative for itching and rash.  Neurological:  Negative for dizziness, tingling, focal weakness, weakness and headaches.  Endo/Heme/Allergies:  Does not bruise/bleed easily.  Psychiatric/Behavioral:  Negative for depression. The patient is not nervous/anxious and does not have insomnia.      MEDICAL HISTORY:  Past Medical History:  Diagnosis Date  . Actinic keratosis 02/12/2020   R lat calf (hypertrophic)  . Arthritis   . Cancer of sigmoid ([place]) 06/17/2016  . Colon cancer [facility]) 2009   T3,N0; s/p resection  Dr. Hulda Humphrey and chemotherapy by Dr. Cynda Acres  . Diastolic dysfunction    a. 08/2020 Echo: EF 60-65%, no rwma, Gr1 DD, nl RV  size/fxn. Mild MR.  Marland Kitchen Hernia of flank   . History of actinic keratoses 08/11/2020   Bx proven at left lateral ankle proximal, LN2 10/08/20  . History of stress test    a. 09/2020 MV: EF >65%. No ischemia/infarct. Mild Ao Ca2+ and minimal Cor Ca2+.  . Hypertension   . Neuropathy   . Polyp at cervical os    s/p resection Dr. Sabra Heck  . Skin cancer 01/22/2020   surface of an atypical verrucous squamous proliferation   . Squamous cell carcinoma of skin 07/16/2020   Left lateral ankle anterior, treated with [facility] 08-11-2020    SURGICAL HISTORY: Past Surgical History:  Procedure Laterality Date  . BREAST BIOPSY Right 05/2014   Dr. Dwyane Luo office-benign  . BREAST LUMPECTOMY WITH SENTINEL LYMPH NODE BIOPSY Right 01/21/2022   Procedure: BREAST LUMPECTOMY WITH SENTINEL LYMPH NODE BX;  Surgeon: Robert Bellow, MD;  Location: [facility] ORS;  Service: General;  Laterality: Right;  . BREAST SURGERY Right February 2016   Vacuum assisted biopsy for recurrent cyst, fibrocystic changes without evidence of malignancy.  Marland Kitchen CATARACT EXTRACTION W/PHACO Left 01/13/2015   Procedure: CATARACT EXTRACTION PHACO AND INTRAOCULAR LENS PLACEMENT (IOC);  Surgeon: Birder Robson, MD;  Location: [facility] ORS;  Service: Ophthalmology;  Laterality: Left;  [place]  1:02.9 AP   19.2 CDE  12.06 casette lot #  [id number]  . CATARACT EXTRACTION W/PHACO Right 02/03/2015   Procedure: CATARACT EXTRACTION PHACO AND INTRAOCULAR LENS PLACEMENT ([place]);  Surgeon: Birder Robson, MD;  Location: [facility] ORS;  Service: Ophthalmology;  Laterality: Right;  us00:53 ap46.5 [id number]  . COLECTOMY    . COLONOSCOPY  2015   Dr Candace Cruise  . COLONOSCOPY WITH PROPOFOL N/A 09/13/2017   Procedure: COLONOSCOPY WITH PROPOFOL;  Surgeon: Robert Bellow, MD;  Location: [facility] ENDOSCOPY;  Service: Endoscopy;  Laterality: N/A;  . COLONOSCOPY WITH PROPOFOL N/A 11/08/2017   Procedure: COLONOSCOPY WITH PROPOFOL;  Surgeon: Robert Bellow, MD;  Location: [facility] ENDOSCOPY;   Service: Endoscopy;  Laterality: N/A;  . colostomy reversal    . DILATION AND CURETTAGE OF UTERUS  2014   Dr. Sabra Heck, benign per pt  . EYE SURGERY    . HERNIA REPAIR  07/13/12   ventral   . SKIN CANCER EXCISION    . TONSILLECTOMY      SOCIAL HISTORY: Social History   Socioeconomic History  . Marital status: Married    Spouse name: Not on file  . Number of children: Not on file  . Years of education: Not on file  . Highest education level: Not on file  Occupational History  . Not on file  Tobacco Use  . Smoking status: Former    Types: Cigarettes    Quit date: 10/12/2007    Years since quitting: 14.3  . Smokeless tobacco: Never  Vaping Use  . Vaping Use: Never used  Substance and Sexual Activity  . Alcohol use: Yes    Alcohol/week: 1.0 standard drink of alcohol    Types: 1 Standard drinks or equivalent per week    Comment: with dinner GLASS OF WINE EACH DAY  . Drug use: No  . Sexual activity: Not on file  Other Topics Concern  . Not on file  Social History Narrative   Lives in [place] with husband. 2 children, son lives nearby.Diet - regularExercise - none. 30 mins/in [place]. Quit smoking in 2009. Wine before dinner. Pianist in church; real estate; Adult nurse.    Social Determinants of Health   Financial Resource Strain: Low Risk  (11/13/2018)   Overall Financial Resource Strain (CARDIA)   . Difficulty of Paying Living Expenses: Not hard at all  Food Insecurity: No Food Insecurity (11/16/2020)   Hunger Vital Sign   . Worried About Charity fundraiser in the Last Year: Never true   . Ran Out of Food in the Last Year: Never true  Transportation Needs: No Transportation Needs (11/16/2020)   PRAPARE - Transportation   . Lack of Transportation (Medical): No   . Lack of Transportation (Non-Medical): No  Physical Activity: Not on file  Stress: No Stress Concern Present (11/16/2020)   [place]   .  Feeling of Stress : Not at all  Social Connections: Not on file  Intimate Partner Violence: Not At Risk (11/16/2020)   Humiliation, Afraid, Rape, and Kick questionnaire   . Fear of Current or Ex-Partner: No   . Emotionally Abused: No   . Physically Abused: No   . Sexually Abused: No    FAMILY HISTORY: Family History  Problem Relation Age of Onset  . Stroke Mother   . Diabetes Mother   . Cancer Father        colon  . Stroke Sister   . Cancer Brother        colon  . Cancer Brother        brain    ALLERGIES:  is allergic to sulfa antibiotics.  MEDICATIONS:  Current Outpatient Medications  Medication Sig Dispense Refill  . atorvastatin (LIPITOR) 10 MG tablet TAKE ONE TABLET BY MOUTH DAILY 90 tablet 1  . Calcium Carbonate-Vit D-Min (CALCIUM 1200 PO) Take by mouth daily.    . clobetasol ointment (TEMOVATE) 4.28 % Apply 1 application topically 2 (two) times daily. To affected areas of rash for up to 2-3 weeks. Avoid applying to face, groin, and axilla. Use as directed. Long-term use can cause thinning of the skin. 45 g 1  . dexamethasone (DECADRON) 4 MG tablet Take 1 tablet (4 mg total) by mouth 2 (two) times daily with a meal. Start 1 days prior to chemo. Take for 1 day ONLY.  DO NOT take on the day of chemo. 60 tablet 0  . hydrochlorothiazide (HYDRODIURIL) 12.5 MG tablet TAKE ONE TABLET BY MOUTH DAILY 90 tablet 1  . losartan (COZAAR) 100 MG tablet Take 100 mg by mouth daily.    . niacinamide 500 MG tablet Take 500 mg by mouth 2 (two) times daily.    . Omega-3 Fatty Acids (FISH OIL PO) Take by mouth daily.    Marland Kitchen  ondansetron (ZOFRAN) 8 MG tablet One pill every 8 hours as needed for nausea/vomitting. 40 tablet 1  . prochlorperazine (COMPAZINE) 10 MG tablet Take 1 tablet (10 mg total) by mouth every 6 (six) hours as needed for nausea or vomiting. 40 tablet 1  . Ruxolitinib Phosphate (OPZELURA) 1.5 % CREA Apply 1 application topically daily. 60 g 2  . sodium fluoride (FLUORISHIELD) 1.1 %  GEL dental gel Place onto teeth.    Marland Kitchen VITAMIN D, CHOLECALCIFEROL, PO Take 2,000 Units by mouth daily.    Marland Kitchen HYDROcodone-acetaminophen (NORCO/VICODIN) 5-325 MG tablet Take 1 tablet by mouth every 4 (four) hours as needed for moderate pain. 15 tablet 0  . lidocaine-prilocaine (EMLA) cream Apply to areola one hour prior to arrival day of procedure. Cover with saran wrap.     No current facility-administered medications for this visit.      Marland Kitchen  PHYSICAL EXAMINATION:   Vitals:   02/14/22 1435  BP: 130/80   Filed Weights   02/14/22 1435  Weight: 186 lb (84.4 kg)    Physical Exam Vitals and nursing note reviewed.  HENT:     Head: Normocephalic and atraumatic.     Mouth/Throat:     Pharynx: Oropharynx is clear.  Eyes:     Extraocular Movements: Extraocular movements intact.     Pupils: Pupils are equal, round, and reactive to light.  Cardiovascular:     Rate and Rhythm: Normal rate and regular rhythm.  Pulmonary:     Comments: Decreased breath sounds bilaterally.  Abdominal:     Palpations: Abdomen is soft.  Musculoskeletal:        General: Normal range of motion.     Cervical back: Normal range of motion.  Skin:    General: Skin is warm.  Neurological:     General: No focal deficit present.     Mental Status: She is alert and oriented to person, place, and time.  Psychiatric:        Behavior: Behavior normal.        Judgment: Judgment normal.     LABORATORY DATA:  I have reviewed the data as listed Lab Results  Component Value Date   WBC 7.3 02/14/2022   HGB 13.7 02/14/2022   HCT 42.5 02/14/2022   MCV 102.7 (H) 02/14/2022   PLT 229 02/14/2022   Recent Labs    05/24/21 1447 01/19/22 0919 02/14/22 1546  NA 137 138 134*  K 4.0 4.0 3.5  CL 98 101 96*  CO2 32 27 27  GLUCOSE 91 96 104*  BUN $Re'21 20 20  'oKS$ CREATININE 0.86 0.85 0.84  CALCIUM 10.4 9.9 10.0  GFRNONAA  --  >60 >60  PROT 6.8  --  8.1  ALBUMIN 4.3  --  4.4  AST 17  --  29  ALT 11  --  20  ALKPHOS  49  --  65  BILITOT 0.6  --  0.7    RADIOGRAPHIC STUDIES: I have personally reviewed the radiological images as listed and agreed with the findings in the report. MM Breast Surgical Specimen  Result Date: 01/21/2022 CLINICAL DATA:  Status post lumpectomy of the RIGHT breast. No preoperative localization was performed. Patient was biopsied in the surgeon's office. Epic note dated 12/30/2021 does not indicate whether a clip was placed or not. Patient has a remotely placed omega shaped clip from remote prior benign biopsy EXAM: SPECIMEN RADIOGRAPH OF THE RIGHT BREAST COMPARISON:  Previous exam(s). FINDINGS: Status post excision of the RIGHT breast.  A RIBBON clip is present within the specimen. Remote biopsy (omega shaped) biopsy marking clip is present within the specimen IMPRESSION: Specimen radiograph of the RIGHT breast. A RIBBON clip is present within the specimen. Electronically Signed   By: Valentino Saxon M.D.   On: 01/21/2022 10:18  NM Sentinel Node Inj-No Rpt (Breast)  Result Date: 01/21/2022 Sulfur Colloid was injected by the Nuclear Medicine Technologist for sentinel lymph node localization.    ASSESSMENT & PLAN:   Carcinoma of overlapping sites of right breast in female, estrogen receptor positive ([place]) # Right breast cancer -[facility]; ER;PR positive G-3; LVI + T2N1.  Stage II [OCT 2023; Dr.Byrnett] s/p Lumpectomy.   # I had a long discussion with the patient in general regarding the treatment options of breast cancer including-surgery; adjuvant radiation; role of adjuvant systemic therapy including-chemotherapy antihormone therapy.  Patient is currently status post lumpectomy.  Currently status post evaluation with radiation oncology.  #I discussed the rationale and role of adjuvant systemic therapy to improve the risk of recurrence.  Discussed the options include-both chemotherapy and endocrine therapy.   #I reviewed that node positive-Oncotype RS- 35-which translates to approximately  30% risk of recurrence with endocrine therapy alone.  Reviewed that if chemotherapy the risk of recurrence would be down to 15%.  In general this would be considered high risk-and chemotherapy would be recommended; in addition to endocrine therapy.  Also consider abema x2 years  #Prediabetes-monitor closely on steroids/chemotherapy.    # Hx of colon cancer stage III- s/p FOLFOX [2009]-residual neuropathy monitor closely on chemotherapy.   # Genetic testing: brother-& father- colon cancer; other brother- brain tumor; no breast cancers in family.  Discussed.  Patient agreement.  We will make a referral down the line.  Thank you Dr. Bary Castilla for allowing me to participate in the care of your pleasant patient. Please do not hesitate to contact me with questions or concerns in the interim.  Discussed with Dr. Bary Castilla; Dr. Donella Stade   # DISPOSITION: # chemo education ASAP # labs- cbc/cmp today # follow up on NOv 1st, 2023- MD: labs- cbc/cmp; Taxoeter-Cytoxan [NEW]; D-2Margart Sickles- Dr.B    All questions were answered. The patient/family knows to call the clinic with any problems, questions or concerns.       Cammie Sickle, MD 02/15/2022 11:56 AM

## 2022-02-14 NOTE — Progress Notes (Signed)
START ON PATHWAY REGIMEN - Breast     A cycle is every 21 days:     Docetaxel      Cyclophosphamide   **Always confirm dose/schedule in your pharmacy ordering system**  Patient Characteristics: Postoperative without Neoadjuvant Therapy (Pathologic Staging), Invasive Disease, Adjuvant Therapy, HER2 Negative, ER Positive, Node Positive, Node Positive (1-3), Oncotyping Ordered, Oncotype High Risk (? 26) Therapeutic Status: Postoperative without Neoadjuvant Therapy (Pathologic Staging) AJCC Grade: G3 AJCC N Category: pN1 AJCC M Category: cM0 ER Status: Positive (+) AJCC 8 Stage Grouping: IIA HER2 Status: Negative (-) Oncotype Dx Recurrence Score: 37 AJCC T Category: pT2 PR Status: Positive (+) Has this patient completed genomic testing<= Yes - Oncotype DX(R) Intent of Therapy: Curative Intent, Discussed with Patient

## 2022-02-14 NOTE — Consult Note (Signed)
NEW PATIENT EVALUATION  Name: Sabrina Wilson  MRN: [id number]  Date:   02/14/2022     DOB: June 30, 1940   This 81 y.o. female patient presents to the clinic for initial evaluation of stage IIb (T2 cN1 M0) ER/PR positive HER2 negative invasive mammary carcinoma of the right breast status post wide local excision and sentinel node biopsy.  REFERRING PHYSICIAN: Leone Haven, MD  CHIEF COMPLAINT:  Chief Complaint  Patient presents with   Breast Cancer    Consult    DIAGNOSIS: The encounter diagnosis was Carcinoma of overlapping sites of right breast in female, estrogen receptor positive ([place]).   PREVIOUS INVESTIGATIONS:  Mammogram and ultrasound reviewed, breast MRI reviewed Clinical notes reviewed Pathology reports reviewed  HPI: Patient is a 81 year old female who presented with self discovered mass in the right breast.  Imaging confirmed a 2 x 1 x 1.5 cm irregular mass at the 11 o'clock position was identified.  There is also right axillary ultrasound demonstrated normal lymph nodes.  Breast MRI confirmed a complex cystic and solid appearing mass in the subareolar right breast overall measuring 3.1 cm.  Tumor abutted the skin but without evidence of involvement or enhancement of the skin.  Biopsy confirmed breast cancer and she underwent a wide local excision and sentinel node biopsy.  Tumor was overall grade 3 measuring 2.4 cm.  Margins were clear at 1 cm.  Margins for DCIS also were clear at 1 cm.  There was 1 out of 3 lymph nodes positive for macro metastatic disease greater than 2 mm.  The posit actually measured 3 mm.  Extranodal extension was not identified.  She has done well postoperatively.  She had an Oncotype DX performed showing a recurrence score of 37 with recommendation for adjuvant chemotherapy.  She is seeing Dr. B today for that discussion.  She is seen today for radiation collagen opinion.  She is doing well still has some tenderness in the right axilla which is  common.  She specifically denies breast tenderness cough or bone pain.  PLANNED TREATMENT REGIMEN: Right whole breast and peripheral lymphatic radiation  PAST MEDICAL HISTORY:  has a past medical history of Actinic keratosis (02/12/2020), Arthritis, Cancer of sigmoid ([place]) (06/17/2016), Colon cancer ([place]) (7673), Diastolic dysfunction, Hernia of flank, History of actinic keratoses (08/11/2020), History of stress test, Hypertension, Neuropathy, Polyp at cervical os, Skin cancer (01/22/2020), and Squamous cell carcinoma of skin (07/16/2020).    PAST SURGICAL HISTORY:  Past Surgical History:  Procedure Laterality Date   BREAST BIOPSY Right 05/2014   Dr. Dwyane Luo office-benign   BREAST LUMPECTOMY WITH SENTINEL LYMPH NODE BIOPSY Right 01/21/2022   Procedure: BREAST LUMPECTOMY WITH SENTINEL LYMPH NODE BX;  Surgeon: Robert Bellow, MD;  Location: [facility] ORS;  Service: General;  Laterality: Right;   BREAST SURGERY Right February 2016   Vacuum assisted biopsy for recurrent cyst, fibrocystic changes without evidence of malignancy.   CATARACT EXTRACTION W/PHACO Left 01/13/2015   Procedure: CATARACT EXTRACTION PHACO AND INTRAOCULAR LENS PLACEMENT (IOC);  Surgeon: Birder Robson, MD;  Location: [facility] ORS;  Service: Ophthalmology;  Laterality: Left;  [place]  1:02.9 AP   19.2 CDE  12.06 casette lot #  [id number]   CATARACT EXTRACTION W/PHACO Right 02/03/2015   Procedure: CATARACT EXTRACTION PHACO AND INTRAOCULAR LENS PLACEMENT (IOC);  Surgeon: Birder Robson, MD;  Location: [facility] ORS;  Service: Ophthalmology;  Laterality: Right;  us00:53 ap46.5 [id number]   COLECTOMY     COLONOSCOPY  2015   Dr Candace Cruise  COLONOSCOPY WITH PROPOFOL N/A 09/13/2017   Procedure: COLONOSCOPY WITH PROPOFOL;  Surgeon: Robert Bellow, MD;  Location: [facility] END[facility]COPY;  Service: Endoscopy;  Laterality: N/A;   COLONOSCOPY WITH PROPOFOL N/A 11/08/2017   Procedure: COLONOSCOPY WITH PROPOFOL;  Surgeon: Robert Bellow, MD;  Location: [facility]  ENDOSCOPY;  Service: Endoscopy;  Laterality: N/A;   colostomy reversal     DILATION AND CURETTAGE OF UTERUS  2014   Dr. Sabra Heck, benign per pt   EYE SURGERY     HERNIA REPAIR  07/13/12   ventral    SKIN CANCER EXCISION     TONSILLECTOMY      FAMILY HISTORY: family history includes Cancer in her brother, brother, and father; Diabetes in her mother; Stroke in her mother and sister.  SOCIAL HISTORY:  reports that she quit smoking about 14 years ago. Her smoking use included cigarettes. She has never used smokeless tobacco. She reports current alcohol use of about 1.0 standard drink of alcohol per week. She reports that she does not use drugs.  ALLERGIES: Sulfa antibiotics  MEDICATIONS:  Current Outpatient Medications  Medication Sig Dispense Refill   atorvastatin (LIPITOR) 10 MG tablet TAKE ONE TABLET BY MOUTH DAILY 90 tablet 1   Calcium Carbonate-Vit D-Min (CALCIUM 1200 PO) Take by mouth daily.     clobetasol ointment (TEMOVATE) 7.86 % Apply 1 application topically 2 (two) times daily. To affected areas of rash for up to 2-3 weeks. Avoid applying to face, groin, and axilla. Use as directed. Long-term use can cause thinning of the skin. 45 g 1   hydrochlorothiazide (HYDRODIURIL) 12.5 MG tablet TAKE ONE TABLET BY MOUTH DAILY 90 tablet 1   HYDROcodone-acetaminophen (NORCO/VICODIN) 5-325 MG tablet Take 1 tablet by mouth every 4 (four) hours as needed for moderate pain. 15 tablet 0   lidocaine-prilocaine (EMLA) cream Apply to areola one hour prior to arrival day of procedure. Cover with saran wrap.     losartan (COZAAR) 100 MG tablet Take 100 mg by mouth daily.     niacinamide 500 MG tablet Take 500 mg by mouth 2 (two) times daily.     Omega-3 Fatty Acids (FISH OIL PO) Take by mouth daily.     Ruxolitinib Phosphate (OPZELURA) 1.5 % CREA Apply 1 application topically daily. 60 g 2   sodium fluoride (FLUORISHIELD) 1.1 % GEL dental gel Place onto teeth.     VITAMIN D, CHOLECALCIFEROL, PO Take  2,000 Units by mouth daily.     No current facility-administered medications for this encounter.    ECOG PERFORMANCE STATUS:  0 - Asymptomatic  REVIEW OF SYSTEMS: Patient has a history of colon cancer and with adjuvant chemotherapy approximately 15 years prior. Patient denies any weight loss, fatigue, weakness, fever, chills or night sweats. Patient denies any loss of vision, blurred vision. Patient denies any ringing  of the ears or hearing loss. No irregular heartbeat. Patient denies heart murmur or history of fainting. Patient denies any chest pain or pain radiating to her upper extremities. Patient denies any shortness of breath, difficulty breathing at night, cough or hemoptysis. Patient denies any swelling in the lower legs. Patient denies any nausea vomiting, vomiting of blood, or coffee ground material in the vomitus. Patient denies any stomach pain. Patient states has had normal bowel movements no significant constipation or diarrhea. Patient denies any dysuria, hematuria or significant nocturia. Patient denies any problems walking, swelling in the joints or loss of balance. Patient denies any skin changes, loss of hair or loss of  weight. Patient denies any excessive worrying or anxiety or significant depression. Patient denies any problems with insomnia. Patient denies excessive thirst, polyuria, polydipsia. Patient denies any swollen glands, patient denies easy bruising or easy bleeding. Patient denies any recent infections, allergies or URI. Patient "s visual fields have not changed significantly in recent time.   PHYSICAL EXAM: Pulse 79   Temp (!) 97.3 F (36.3 C)   Resp 18   Ht 5' 6.5" (1.689 m)   Wt 186 lb (84.4 kg)   BMI 29.57 kg/m  She is status post wide local excision of the right breast with sacrifice of the nipple areolar complex.  No dominant masses noted in either breast.  No axillary or supraclavicular adenopathy is appreciated.  Well-developed well-nourished patient in  NAD. HEENT reveals PERLA, EOMI, discs not visualized.  Oral cavity is clear. No oral mucosal lesions are identified. Neck is clear without evidence of cervical or supraclavicular adenopathy. Lungs are clear to A&P. Cardiac examination is essentially unremarkable with regular rate and rhythm without murmur rub or thrill. Abdomen is benign with no organomegaly or masses noted. Motor sensory and DTR levels are equal and symmetric in the upper and lower extremities. Cranial nerves II through XII are grossly intact. Proprioception is intact. No peripheral adenopathy or edema is identified. No motor or sensory levels are noted. Crude visual fields are within normal range.  LABORATORY DATA: Pathology reports reviewed    RADIOLOGY RESULTS: Mammograms ultrasound and MRI scans reviewed compatible with above-stated findings   IMPRESSION: Stage IIb invasive mammary carcinoma of the right breast status post wide local excision and sentinel node biopsy in 81 year old female  PLAN: At this time I have recommended adjuvant radiation therapy to her right breast and peripheral lymphatics.  Would plan on delivering 5040 cGy in 28 fractions to both areas boosting her scar another 1000 cGy.  Risks and benefits of radiation including skin reaction fatigue alteration blood counts possible inclusion of superficial lung and slight chance of lymphedema in her right upper extremity all were explained in detail to the patient.  I believe based on her recurrence score on Oncotype DX she will have chemotherapy recommended and patient will except that.  I will see her back in follow-up after completion of chemotherapy for further discussion of radiation therapy.  Patient comprehends my recommendations well.  She knows to call with any concerns.  I would like to take this opportunity to thank you for allowing me to participate in the care of your patient.Noreene Filbert, MD

## 2022-02-14 NOTE — Progress Notes (Signed)
New patient here to establish care Sigmoid Colon Cancer. Patient denies any complaints at this time.

## 2022-02-14 NOTE — Assessment & Plan Note (Addendum)
#   Right breast cancer -[facility]; ER;PR positive G-3; LVI + T2N1.  Stage II [OCT 2023; Dr.Byrnett] s/p Lumpectomy.   # I had a long discussion with the patient in general regarding the treatment options of breast cancer including-surgery; adjuvant radiation; role of adjuvant systemic therapy including-chemotherapy antihormone therapy.  Patient is currently status post lumpectomy.  Currently status post evaluation with radiation oncology.  #I discussed the rationale and role of adjuvant systemic therapy to improve the risk of recurrence.  Discussed the options include-both chemotherapy and endocrine therapy.   #I reviewed that node positive-Oncotype RS- 35-which translates to approximately 30% risk of recurrence with endocrine therapy alone.  Reviewed that if chemotherapy the risk of recurrence would be down to 15%.  In general this would be considered high risk-and chemotherapy would be recommended; in addition to endocrine therapy.  Also consider abema x2 years  #Prediabetes-monitor closely on steroids/chemotherapy.    # Hx of colon cancer stage III- s/p FOLFOX [2009]-residual neuropathy monitor closely on chemotherapy.   # Genetic testing: brother-& father- colon cancer; other brother- brain tumor; no breast cancers in family.  Discussed.  Patient agreement.  We will make a referral down the line.  Thank you Dr. Bary Castilla for allowing me to participate in the care of your pleasant patient. Please do not hesitate to contact me with questions or concerns in the interim.  Discussed with Dr. Bary Castilla; Dr. Donella Stade   # DISPOSITION: # chemo education ASAP # labs- cbc/cmp today # follow up on NOv 1st, 2023- MD: labs- cbc/cmp; Taxoeter-Cytoxan [NEW]; D-2- Margart Sickles- Dr.B

## 2022-02-15 ENCOUNTER — Encounter: Payer: Self-pay | Admitting: Internal Medicine

## 2022-02-15 ENCOUNTER — Other Ambulatory Visit: Payer: Self-pay

## 2022-02-15 ENCOUNTER — Other Ambulatory Visit: Payer: Medicare Other

## 2022-02-18 ENCOUNTER — Inpatient Hospital Stay: Payer: Medicare Other

## 2022-02-21 ENCOUNTER — Encounter (INDEPENDENT_AMBULATORY_CARE_PROVIDER_SITE_OTHER): Payer: Self-pay

## 2022-02-22 MED FILL — Dexamethasone Sodium Phosphate Inj 100 MG/10ML: INTRAMUSCULAR | Qty: 1 | Status: AC

## 2022-02-23 ENCOUNTER — Inpatient Hospital Stay: Payer: Medicare Other | Attending: Internal Medicine

## 2022-02-23 ENCOUNTER — Encounter: Payer: Self-pay | Admitting: Internal Medicine

## 2022-02-23 ENCOUNTER — Inpatient Hospital Stay (HOSPITAL_BASED_OUTPATIENT_CLINIC_OR_DEPARTMENT_OTHER): Payer: Medicare Other | Admitting: Internal Medicine

## 2022-02-23 ENCOUNTER — Encounter: Payer: Self-pay | Admitting: *Deleted

## 2022-02-23 ENCOUNTER — Inpatient Hospital Stay: Payer: Medicare Other

## 2022-02-23 VITALS — BP 164/57 | HR 79 | Temp 97.3°F | Resp 18 | Ht 65.0 in | Wt 184.2 lb

## 2022-02-23 VITALS — BP 149/86 | HR 78 | Temp 97.4°F | Resp 18

## 2022-02-23 DIAGNOSIS — Z170 Estrogen receptor positive status [ER+]: Secondary | ICD-10-CM | POA: Diagnosis not present

## 2022-02-23 DIAGNOSIS — R7303 Prediabetes: Secondary | ICD-10-CM | POA: Insufficient documentation

## 2022-02-23 DIAGNOSIS — Z79899 Other long term (current) drug therapy: Secondary | ICD-10-CM | POA: Diagnosis not present

## 2022-02-23 DIAGNOSIS — Z85038 Personal history of other malignant neoplasm of large intestine: Secondary | ICD-10-CM | POA: Insufficient documentation

## 2022-02-23 DIAGNOSIS — Z5111 Encounter for antineoplastic chemotherapy: Secondary | ICD-10-CM | POA: Insufficient documentation

## 2022-02-23 DIAGNOSIS — C50811 Malignant neoplasm of overlapping sites of right female breast: Secondary | ICD-10-CM

## 2022-02-23 DIAGNOSIS — R21 Rash and other nonspecific skin eruption: Secondary | ICD-10-CM | POA: Insufficient documentation

## 2022-02-23 LAB — COMPREHENSIVE METABOLIC PANEL
ALT: 20 U/L (ref 0–44)
AST: 25 U/L (ref 15–41)
Albumin: 4.2 g/dL (ref 3.5–5.0)
Alkaline Phosphatase: 59 U/L (ref 38–126)
Anion gap: 8 (ref 5–15)
BUN: 21 mg/dL (ref 8–23)
CO2: 26 mmol/L (ref 22–32)
Calcium: 10.1 mg/dL (ref 8.9–10.3)
Chloride: 102 mmol/L (ref 98–111)
Creatinine, Ser: 0.78 mg/dL (ref 0.44–1.00)
GFR, Estimated: >60 mL/min (ref >60)
Glucose, Bld: 145 mg/dL — ABNORMAL HIGH (ref 70–99)
Potassium: 4 mmol/L (ref 3.5–5.1)
Sodium: 136 mmol/L (ref 135–145)
Total Bilirubin: 0.4 mg/dL (ref 0.3–1.2)
Total Protein: 7.5 g/dL (ref 6.5–8.1)

## 2022-02-23 LAB — CBC WITH DIFFERENTIAL/PLATELET
Abs Immature Granulocytes: 0.07 10*3/uL (ref 0.00–0.07)
Basophils Absolute: 0 10*3/uL (ref 0.0–0.1)
Basophils Relative: 0 %
Eosinophils Absolute: 0 10*3/uL (ref 0.0–0.5)
Eosinophils Relative: 0 %
HCT: 38.5 % (ref 36.0–46.0)
Hemoglobin: 12.9 g/dL (ref 12.0–15.0)
Immature Granulocytes: 1 %
Lymphocytes Relative: 15 %
Lymphs Abs: 1.4 10*3/uL (ref 0.7–4.0)
MCH: 32.7 pg (ref 26.0–34.0)
MCHC: 33.5 g/dL (ref 30.0–36.0)
MCV: 97.5 fL (ref 80.0–100.0)
Monocytes Absolute: 0.7 10*3/uL (ref 0.1–1.0)
Monocytes Relative: 7 %
Neutro Abs: 7.5 10*3/uL (ref 1.7–7.7)
Neutrophils Relative %: 77 %
Platelets: 244 10*3/uL (ref 150–400)
RBC: 3.95 MIL/uL (ref 3.87–5.11)
RDW: 13 % (ref 11.5–15.5)
WBC: 9.7 10*3/uL (ref 4.0–10.5)
nRBC: 0 % (ref 0.0–0.2)

## 2022-02-23 MED ORDER — SODIUM CHLORIDE 0.9 % IV SOLN
600.0000 mg/m2 | Freq: Once | INTRAVENOUS | Status: AC
  Administered 2022-02-23: 1200 mg via INTRAVENOUS
  Filled 2022-02-23: qty 60

## 2022-02-23 MED ORDER — SODIUM CHLORIDE 0.9 % IV SOLN
Freq: Once | INTRAVENOUS | Status: AC
  Filled 2022-02-23: qty 250

## 2022-02-23 MED ORDER — SODIUM CHLORIDE 0.9 % IV SOLN
75.0000 mg/m2 | Freq: Once | INTRAVENOUS | Status: AC
  Administered 2022-02-23: 150 mg via INTRAVENOUS
  Filled 2022-02-23: qty 15

## 2022-02-23 MED ORDER — SODIUM CHLORIDE 0.9 % IV SOLN
10.0000 mg | Freq: Once | INTRAVENOUS | Status: AC
  Administered 2022-02-23: 10 mg via INTRAVENOUS
  Filled 2022-02-23: qty 10

## 2022-02-23 MED ORDER — PALONOSETRON HCL INJECTION 0.25 MG/5ML
0.2500 mg | Freq: Once | INTRAVENOUS | Status: AC
  Administered 2022-02-23: 0.25 mg via INTRAVENOUS
  Filled 2022-02-23: qty 5

## 2022-02-23 MED ORDER — HEPARIN SOD (PORK) LOCK FLUSH 100 UNIT/ML IV SOLN
500.0000 [IU] | Freq: Once | INTRAVENOUS | Status: DC | PRN
  Filled 2022-02-23: qty 5

## 2022-02-23 NOTE — Progress Notes (Signed)
I connected with Lewanda Rife on 02/23/22 at  8:30 AM EDT by video enabled telemedicine visit and verified that I am speaking with the correct person using two identifiers.  I discussed the limitations, risks, security and privacy concerns of performing an evaluation and management service by telemedicine and the availability of in-person appointments. I also discussed with the patient that there may be a patient responsible charge related to this service. The patient expressed understanding and agreed to proceed.    Other persons participating in the visit and their role in the encounter: RN/medical reconciliation Patient's location: home Provider's location: office  Oncology History Overview Note  # 2009- Sigmoid colon cancer stage II (T3 N0 4 lymph nodes sampled M0 stage II high risk there was obstruction and perforation abscess; Dr.Hern), workup included a low CEA preoperatively and a chest x-ray and CT scan abdomen pelvis negative for metastatic disease, S/P colostomy; s/p FOLFOX x 6 months. [Dr.Gittin ]  # LATER REVERSED , K RAS  wild type B RAF not evaluated   Also initial iron deficiency with anemia thrombocytosis, high platelets considered reactive, workup includes a normal PCR for CML and a negative [id number] mutation  DIAGNOSIS:  A. BREAST, RIGHT; LUMPECTOMY:  - INVASIVE MAMMARY CARCINOMA.  - DUCTAL CARCINOMA IN SITU (DCIS).  - SEE CANCER SUMMARY BELOW.  - BIOPSY SITE CHANGE WITH CLIP (2).  - FIBROUS TISSUE WITH CYST FORMATION AND FLORID DUCTAL HYPERPLASIA.  - SKIN AND NIPPLE NEGATIVE FOR MALIGNANCY.  - VASCULAR CALCIFICATION.   B. SENTINEL LYMPH NODES 1 AND 2, RIGHT AXILLA; EXCISION:  - METASTATIC CARCINOMA INVOLVES ONE OF TWO LYMPH NODES (1/2).   C. NON-SENTINEL LYMPH NODES, RIGHT AXILLA; EXCISION:  - ONE LYMPH NODE NEGATIVE FOR MALIGNANCY (0/1).   CANCER CASE SUMMARY: INVASIVE CARCINOMA OF THE BREAST  Standard(s): AJCC-UICC 8   SPECIMEN  Procedure: Lumpectomy   Specimen Laterality: Right   TUMOR  Histologic Type: Invasive carcinoma of no special type (ductal)  Histologic Grade (Nottingham Histologic Score)       Glandular ([place])/Tubular Differentiation: 3       Nuclear Pleomorphism: 3       Mitotic Rate: 3       Overall Grade: 3  Tumor Size: 24 mm  Tumor Focality: Single focus of invasive carcinoma  Ductal Carcinoma In Situ (DCIS): Present, nuclear grade 2-3  Tumor Extent: Not applicable  Lymphatic and/or Vascular Invasion: Present  Treatment Effect in the Breast: No known presurgical therapy   MARGINS  Margin Status for Invasive Carcinoma: All margins negative for invasive  carcinoma       Distance from closest margin: 10 mm       Specify closest margin: Superior   Margin Status for DCIS: All margins negative for DCIS       Distance from DCIS to closest margin: 10 mm       Specify closest margin: Superior   REGIONAL LYMPH NODES  Regional Lymph Node Status: Tumor present in regional lymph node(s)       Number of Lymph Nodes with Macrometastases (greater than 2 mm): 1       Number of Lymph Nodes with Micrometastases (greater than 0.2 mm to  2 mm and/or greater than 200 cells): 0       Number of Lymph Nodes with Isolated Tumor Cells (0.2 mm or less OR  200 cells or less): 0       Size of Largest Metastatic Deposit: 3 mm  Extranodal Extension: Not identified       Total Number of Lymph Nodes Examined (sentinel and non-sentinel): 3       Number of Sentinel Nodes Examined: 2   DISTANT METASTASIS  Distant Site(s) Involved, if applicable: Not applicable   PATHOLOGIC STAGE CLASSIFICATION (pTNM, AJCC 8th Edition):  Modified Classification: Not applicable  pT Category: pT2  T Suffix: Not applicable  pN Category: pN1a  N Suffix: (sn)  pM Category: Not applicable   SPECIAL STUDIES  Breast Biomarker Testing Performed on Previous Outside Biopsy:  [id number]-T11  Per outside report:  Estrogen Receptor (ER) Status: POSITIVE           Percentage of cells with nuclear positivity: Greater than 90%          Average intensity of staining: Strong   Progesterone Receptor (PgR) Status: POSITIVE          Percentage of cells with nuclear positivity: 60%          Average intensity of staining: Strong   HER2 (by immunohistochemistry): EQUIVOCAL (Score 2+)  HER2 FISH: NEGATIVE   Ki-67: Not performed   IMPRESSION: 1. Complex cystic and solid-appearing mass within the subareolar RIGHT breast, overall measuring 3.1 cm greatest dimension, the solid enhancing component at the anterior-lateral aspect of the mass measuring 2 x 1 cm, abutting the overlying skin but without evidence of involvement/enhancement of the skin. Susceptibility artifact within the mass may represent a biopsy clip (if biopsy has been performed at an outside site since the diagnostic mammogram/ultrasound of 12/28/2021) or this artifact could represent calcifications within the mass. This mass corresponds to the recent ultrasound finding of a complex irregular mass at the 11 o'clock axis of the retroareolar RIGHT breast. 2. No evidence of multifocal or multicentric disease within the RIGHT breast. 3. No evidence of contralateral disease in the LEFT breast.   RECOMMENDATION: If has not already been performed, recommend ultrasound-guided biopsy of the suspicious mass in the subareolar RIGHT breast, with preferential targeting of the anterior-lateral component of the mass which is shown to be the enhancing component on today's MRI.   BI-RADS CATEGORY  4: Suspicious.     Cancer of sigmoid ([place]) (Resolved)  Carcinoma of overlapping sites of right breast in female, estrogen receptor positive ([place])  02/14/2022 Initial Diagnosis   Carcinoma of overlapping sites of right breast in female, estrogen receptor positive ([place])   02/14/2022 Cancer Staging   Staging form: Breast, AJCC 8th Edition - Pathologic: Stage IIA (pT2, pN1, cM0, G3, ER+, PR+, HER2-) - Signed  by Cammie Sickle, MD on 02/14/2022 Histologic grading system: 3 grade system   02/23/2022 -  Chemotherapy   Patient is on Treatment Plan : BREAST TC q21d      Chief Complaint: breast cancer   History of present illness:Sabrina Wilson 81 y.o.  female with history of Stage II ER/PR positive right breast cancer [high risk Oncotype] is here to proceed with adjuvant chemotherapy.  In the interim patient underwent chemotherapy education.  Denies any further complaints.  Patient Is anxious.   Observation/objective: Alert & oriented x 3. In No acute distress.   Assessment and plan: Carcinoma of overlapping sites of right breast in female, estrogen receptor positive ([place]) # Right breast cancer -[facility]; ER;PR positive G-3; LVI + T2N1.  Stage II [OCT 2023; Dr.Byrnett] s/p Lumpectomy; I reviewed that node positive-Oncotype RS- 35-which translates to approximately 30% risk of recurrence with endocrine therapy alone.   #Given the high risk Oncotype-proceed with  adjuvant chemotherapy with Taxotere-Cytoxan every 3 weeks for 4 cycles. Reviewed again that the addition of  Chemotherapy would bring the risk of recurrence  down to 15%.  In general this would be considered high risk-and chemotherapy would be recommended; in addition to endocrine therapy.  Also consider abema x2 years   # Proceed with Taxotere -Cytoxan cycle #1 today. Labs today reviewed;  acceptable for treatment today.  Again reminded the patient the potential risk of diarrhea; and to get Imodium at home.  # Prediabetes-monitor closely on steroids/chemotherapy.; PBG- 145- monitor for now.    # Hx of colon cancer stage III- s/p FOLFOX [2009]-residual neuropathy monitor closely on chemotherapy.   # Genetic testing: brother-& father- colon cancer; other brother- brain tumor; no breast cancers in family.  Discussed.  Patient agreement.  We will make a referral down the line.  # IV Access: proceed with PIV today; if not proceed with  medi-port-   TG* # DISPOSITION: # chemo today- # in 1 week- NP; labs- cbc/bmp- [ later in day appt] # follow upon 27th NOV- MD: labs- cbc/cmp; Taxoeter-Cytoxan; D-2- Margart Sickles- Dr.B    Follow-up instructions:  I discussed the assessment and treatment plan with the patient.  The patient was provided an opportunity to ask questions and all were answered.  The patient agreed with the plan and demonstrated understanding of instructions.  The patient was advised to call back or seek an in person evaluation if the symptoms worsen or if the condition fails to improve as anticipated.    Dr. Charlaine Dalton [place] at [facility] 02/23/2022 9:14 AM

## 2022-02-23 NOTE — Patient Instructions (Signed)
[facility] CANCER CTR AT [place]  Discharge Instructions: Thank you for choosing [facility] to provide your oncology and hematology care.  If you have a lab appointment with the [facility], please go directly to the [facility] and check in at the registration area.  Wear comfortable clothing and clothing appropriate for easy access to any Portacath or PICC line.   We strive to give you quality time with your provider. You may need to reschedule your appointment if you arrive late (15 or more minutes).  Arriving late affects you and other patients whose appointments are after yours.  Also, if you miss three or more appointments without notifying the office, you may be dismissed from the clinic at the provider's discretion.      For prescription refill requests, have your pharmacy contact our office and allow 72 hours for refills to be completed.    Today you received the following chemotherapy and/or immunotherapy agents TAXOTERE and CYTOXAN      To help prevent nausea and vomiting after your treatment, we encourage you to take your nausea medication as directed.  BELOW ARE SYMPTOMS THAT SHOULD BE REPORTED IMMEDIATELY: *FEVER GREATER THAN 100.4 F (38 C) OR HIGHER *CHILLS OR SWEATING *NAUSEA AND VOMITING THAT IS NOT CONTROLLED WITH YOUR NAUSEA MEDICATION *UNUSUAL SHORTNESS OF BREATH *UNUSUAL BRUISING OR BLEEDING *URINARY PROBLEMS (pain or burning when urinating, or frequent urination) *BOWEL PROBLEMS (unusual diarrhea, constipation, pain near the anus) TENDERNESS IN MOUTH AND THROAT WITH OR WITHOUT PRESENCE OF ULCERS (sore throat, sores in mouth, or a toothache) UNUSUAL RASH, SWELLING OR PAIN  UNUSUAL VAGINAL DISCHARGE OR ITCHING   Items with * indicate a potential emergency and should be followed up as soon as possible or go to the Emergency Department if any problems should occur.  Please show the CHEMOTHERAPY ALERT CARD or IMMUNOTHERAPY ALERT CARD at  check-in to the Emergency Department and triage nurse.  Should you have questions after your visit or need to cancel or reschedule your appointment, please contact [facility] [place] AT [place]  [phone] and follow the prompts.  Office hours are 8:00 a.m. to 4:30 p.m. Monday - Friday. Please note that voicemails left after 4:00 p.m. may not be returned until the following business day.  We are closed weekends and major holidays. You have access to a nurse at all times for urgent questions. Please call the main number to the clinic [phone] and follow the prompts.  For any non-urgent questions, you may also contact your provider using MyChart. We now offer e-Visits for anyone 51 and older to request care online for non-urgent symptoms. For details visit [email or web address].   Also download the MyChart app! Go to the app store, search "MyChart", open the app, select [place], and log in with your MyChart username and password.  Masks are optional in the cancer centers. If you would like for your care team to wear a mask while they are taking care of you, please let them know. For doctor visits, patients may have with them one support person who is at least 79 years old. At this time, visitors are not allowed in the infusion area.  Docetaxel Injection What is this medication? DOCETAXEL (doe se TAX el) treats some types of cancer. It works by slowing down the growth of cancer cells. This medicine may be used for other purposes; ask your health care provider or pharmacist if you have questions. COMMON BRAND NAME(S): Docefrez, Taxotere What should I tell  my care team before I take this medication? They need to know if you have any of these conditions: Kidney disease Liver disease Low white blood cell levels Tingling of the fingers or toes or other nerve disorder An unusual or allergic reaction to docetaxel, polysorbate 80, other medications, foods, dyes, or  preservatives Pregnant or trying to get pregnant Breast-feeding How should I use this medication? This medication is injected into a vein. It is given by your care team in a hospital or clinic setting. Talk to your care team about the use of this medication in children. Special care may be needed. Overdosage: If you think you have taken too much of this medicine contact a poison control center or emergency room at once. NOTE: This medicine is only for you. Do not share this medicine with others. What if I miss a dose? Keep appointments for follow-up doses. It is important not to miss your dose. Call your care team if you are unable to keep an appointment. What may interact with this medication? Do not take this medication with any of the following: Live virus vaccines This medication may also interact with the following: Certain antibiotics, such as clarithromycin, telithromycin Certain antivirals for HIV or hepatitis Certain medications for fungal infections, such as itraconazole, ketoconazole, voriconazole Grapefruit juice Nefazodone Supplements, such as St. John's wort This list may not describe all possible interactions. Give your health care provider a list of all the medicines, herbs, non-prescription drugs, or dietary supplements you use. Also tell them if you smoke, drink alcohol, or use illegal drugs. Some items may interact with your medicine. What should I watch for while using this medication? This medication may make you feel generally unwell. This is not uncommon as chemotherapy can affect healthy cells as well as cancer cells. Report any side effects. Continue your course of treatment even though you feel ill unless your care team tells you to stop. You may need blood work done while you are taking this medication. This medication can cause serious side effects and infusion reactions. To reduce the risk, your care team may give you other medications to take before receiving  this one. Be sure to follow the directions from your care team. This medication may increase your risk of getting an infection. Call your care team for advice if you get a fever, chills, sore throat, or other symptoms of a cold or flu. Do not treat yourself. Try to avoid being around people who are sick. Avoid taking medications that contain aspirin, acetaminophen, ibuprofen, naproxen, or ketoprofen unless instructed by your care team. These medications may hide a fever. Be careful brushing or flossing your teeth or using a toothpick because you may get an infection or bleed more easily. If you have any dental work done, tell your dentist you are receiving this medication. Some products may contain alcohol. Ask your care team if this medication contains alcohol. Be sure to tell all care teams you are taking this medicine. Certain medications, like metronidazole and disulfiram, can cause an unpleasant reaction when taken with alcohol. The reaction includes flushing, headache, nausea, vomiting, sweating, and increased thirst. The reaction can last from 30 minutes to several hours. This medication may affect your coordination, reaction time, or judgement. Do not drive or operate machinery until you know how this medication affects you. Sit up or stand slowly to reduce the risk of dizzy or fainting spells. Drinking alcohol with this medication can increase the risk of these side effects. Talk to  your care team about your risk of cancer. You may be more at risk for certain types of cancer if you take this medication. Talk to your care team if you wish to become pregnant or think you might be pregnant. This medication can cause serious birth defects if taken during pregnancy or if you get pregnant within 2 months after stopping therapy. A negative pregnancy test is required before starting this medication. A reliable form of contraception is recommended while taking this medication and for 2 months after stopping  it. Talk to your care team about reliable forms of contraception. Do not breast-feed while taking this medication and for 1 week after stopping therapy. Use a condom during sex and for 4 months after stopping therapy. Tell your care team right away if you think your partner might be pregnant. This medication can cause serious birth defects. This medication may cause infertility. Talk to your care team if you are concerned about your fertility. What side effects may I notice from receiving this medication? Side effects that you should report to your care team as soon as possible: Allergic reactions--skin rash, itching, hives, swelling of the face, lips, tongue, or throat Change in vision such as blurry vision, seeing halos around lights, vision loss Infection--fever, chills, cough, or sore throat Infusion reactions--chest pain, shortness of breath or trouble breathing, feeling faint or lightheaded Low red blood cell level--unusual weakness or fatigue, dizziness, headache, trouble breathing Pain, tingling, or numbness in the hands or feet Painful swelling, warmth, or redness of the skin, blisters or sores at the infusion site Redness, blistering, peeling, or loosening of the skin, including inside the mouth Sudden or severe stomach pain, bloody diarrhea, fever, nausea, vomiting Swelling of the ankles, hands, or feet Tumor lysis syndrome (TLS)--nausea, vomiting, diarrhea, decrease in the amount of urine, dark urine, unusual weakness or fatigue, confusion, muscle pain or cramps, fast or irregular heartbeat, joint pain Unusual bruising or bleeding Side effects that usually do not require medical attention (report to your care team if they continue or are bothersome): Change in nail shape, thickness, or color Change in taste Hair loss Increased tears This list may not describe all possible side effects. Call your doctor for medical advice about side effects. You may report side effects to FDA at  1-800-FDA-1088. Where should I keep my medication? This medication is given in a hospital or clinic. It will not be stored at home. NOTE: This sheet is a summary. It may not cover all possible information. If you have questions about this medicine, talk to your doctor, pharmacist, or health care provider.  2023 Elsevier/Gold Standard (2007-06-02 00:00:00)  Cyclophosphamide Injection What is this medication? CYCLOPHOSPHAMIDE (sye kloe FOSS fa mide) treats some types of cancer. It works by slowing down the growth of cancer cells. This medicine may be used for other purposes; ask your health care provider or pharmacist if you have questions. COMMON BRAND NAME(S): Cyclophosphamide, Cytoxan, Neosar What should I tell my care team before I take this medication? They need to know if you have any of these conditions: Heart disease Irregular heartbeat or rhythm Infection Kidney problems Liver disease Low blood cell levels (white cells, platelets, or red blood cells) Lung disease Previous radiation Trouble passing urine An unusual or allergic reaction to cyclophosphamide, other medications, foods, dyes, or preservatives Pregnant or trying to get pregnant Breast-feeding How should I use this medication? This medication is injected into a vein. It is given by your care team in a hospital  or clinic setting. Talk to your care team about the use of this medication in children. Special care may be needed. Overdosage: If you think you have taken too much of this medicine contact a poison control center or emergency room at once. NOTE: This medicine is only for you. Do not share this medicine with others. What if I miss a dose? Keep appointments for follow-up doses. It is important not to miss your dose. Call your care team if you are unable to keep an appointment. What may interact with this medication? Amphotericin B Amiodarone Azathioprine Certain antivirals for HIV or hepatitis Certain  medications for blood pressure, such as enalapril, lisinopril, quinapril Cyclosporine Diuretics Etanercept Indomethacin Medications that relax muscles Metronidazole Natalizumab Tamoxifen Warfarin This list may not describe all possible interactions. Give your health care provider a list of all the medicines, herbs, non-prescription drugs, or dietary supplements you use. Also tell them if you smoke, drink alcohol, or use illegal drugs. Some items may interact with your medicine. What should I watch for while using this medication? This medication may make you feel generally unwell. This is not uncommon as chemotherapy can affect healthy cells as well as cancer cells. Report any side effects. Continue your course of treatment even though you feel ill unless your care team tells you to stop. You may need blood work while you are taking this medication. This medication may increase your risk of getting an infection. Call your care team for advice if you get a fever, chills, sore throat, or other symptoms of a cold or flu. Do not treat yourself. Try to avoid being around people who are sick. Avoid taking medications that contain aspirin, acetaminophen, ibuprofen, naproxen, or ketoprofen unless instructed by your care team. These medications may hide a fever. Be careful brushing or flossing your teeth or using a toothpick because you may get an infection or bleed more easily. If you have any dental work done, tell your dentist you are receiving this medication. Drink water or other fluids as directed. Urinate often, even at night. Some products may contain alcohol. Ask your care team if this medication contains alcohol. Be sure to tell all care teams you are taking this medicine. Certain medicines, like metronidazole and disulfiram, can cause an unpleasant reaction when taken with alcohol. The reaction includes flushing, headache, nausea, vomiting, sweating, and increased thirst. The reaction can last  from 30 minutes to several hours. Talk to your care team if you wish to become pregnant or think you might be pregnant. This medication can cause serious birth defects if taken during pregnancy and for 1 year after the last dose. A negative pregnancy test is required before starting this medication. A reliable form of contraception is recommended while taking this medication and for 1 year after the last dose. Talk to your care team about reliable forms of contraception. Do not father a child while taking this medication and for 4 months after the last dose. Use a condom during this time period. Do not breast-feed while taking this medication or for 1 week after the last dose. This medication may cause infertility. Talk to your care team if you are concerned about your fertility. Talk to your care team about your risk of cancer. You may be more at risk for certain types of cancer if you take this medication. What side effects may I notice from receiving this medication? Side effects that you should report to your care team as soon as possible: Allergic reactions--skin  rash, itching, hives, swelling of the face, lips, tongue, or throat Dry cough, shortness of breath or trouble breathing Heart failure--shortness of breath, swelling of the ankles, feet, or hands, sudden weight gain, unusual weakness or fatigue Heart muscle inflammation--unusual weakness or fatigue, shortness of breath, chest pain, fast or irregular heartbeat, dizziness, swelling of the ankles, feet, or hands Heart rhythm changes--fast or irregular heartbeat, dizziness, feeling faint or lightheaded, chest pain, trouble breathing Infection--fever, chills, cough, sore throat, wounds that don't heal, pain or trouble when passing urine, general feeling of discomfort or being unwell Kidney injury--decrease in the amount of urine, swelling of the ankles, hands, or feet Liver injury--right upper belly pain, loss of appetite, nausea, light-colored  stool, dark yellow or brown urine, yellowing skin or eyes, unusual weakness or fatigue Low red blood cell level--unusual weakness or fatigue, dizziness, headache, trouble breathing Low sodium level--muscle weakness, fatigue, dizziness, headache, confusion Red or dark brown urine Unusual bruising or bleeding Side effects that usually do not require medical attention (report to your care team if they continue or are bothersome): Hair loss Irregular menstrual cycles or spotting Loss of appetite Nausea Pain, redness, or swelling with sores inside the mouth or throat Vomiting This list may not describe all possible side effects. Call your doctor for medical advice about side effects. You may report side effects to FDA at 1-800-FDA-1088. Where should I keep my medication? This medication is given in a hospital or clinic. It will not be stored at home. NOTE: This sheet is a summary. It may not cover all possible information. If you have questions about this medicine, talk to your doctor, pharmacist, or health care provider.  2023 Elsevier/Gold Standard (2021-06-01 00:00:00)

## 2022-02-23 NOTE — Assessment & Plan Note (Addendum)
#   Right breast cancer -[facility]; ER;PR positive G-3; LVI + T2N1.  Stage II [OCT 2023; Dr.Byrnett] s/p Lumpectomy; I reviewed that node positive-Oncotype RS- 35-which translates to approximately 30% risk of recurrence with endocrine therapy alone.   #Given the high risk Oncotype-proceed with adjuvant chemotherapy with Taxotere-Cytoxan every 3 weeks for 4 cycles. Reviewed again that the addition of  Chemotherapy would bring the risk of recurrence  down to 15%.  In general this would be considered high risk-and chemotherapy would be recommended; in addition to endocrine therapy.  Also consider abema x2 years   # Proceed with Taxotere -Cytoxan cycle #1 today. Labs today reviewed;  acceptable for treatment today.  Again reminded the patient the potential risk of diarrhea; and to get Imodium at home.  # Prediabetes-monitor closely on steroids/chemotherapy.; PBG- 145- monitor for now.    # Hx of colon cancer stage III- s/p FOLFOX [2009]-residual neuropathy monitor closely on chemotherapy.   # Genetic testing: brother-& father- colon cancer; other brother- brain tumor; no breast cancers in family.  Discussed.  Patient agreement.  We will make a referral down the line.  # IV Access: proceed with PIV today; if not proceed with medi-port-   TG* # DISPOSITION: # chemo today- # in 1 week- NP; labs- cbc/bmp- [ later in day appt] # follow upon 27th NOV- MD: labs- cbc/cmp; Taxoeter-Cytoxan; D-2- [facility]ick[facility]- Dr.B

## 2022-02-23 NOTE — Progress Notes (Signed)
Patient denies new problems/concerns today.   °

## 2022-02-24 ENCOUNTER — Inpatient Hospital Stay: Payer: Medicare Other

## 2022-02-24 ENCOUNTER — Telehealth: Payer: Self-pay

## 2022-02-24 DIAGNOSIS — Z170 Estrogen receptor positive status [ER+]: Secondary | ICD-10-CM

## 2022-02-24 DIAGNOSIS — Z5111 Encounter for antineoplastic chemotherapy: Secondary | ICD-10-CM | POA: Diagnosis not present

## 2022-02-24 MED ORDER — PEGFILGRASTIM-CBQV 6 MG/0.6ML ~~LOC~~ SOSY
6.0000 mg | PREFILLED_SYRINGE | Freq: Once | SUBCUTANEOUS | Status: AC
  Administered 2022-02-24: 6 mg via SUBCUTANEOUS
  Filled 2022-02-24: qty 0.6

## 2022-02-24 NOTE — Telephone Encounter (Signed)
Telephone call to patient for follow up after receiving first infusion.   No answer but left message stating we were calling to check on them.  Encouraged patient to call for any questions or concerns.   

## 2022-02-24 NOTE — Telephone Encounter (Signed)
Spoke to patient regarding port placement.  She would like to wait on port placement until next MD/tx visit.

## 2022-03-01 ENCOUNTER — Encounter: Payer: Self-pay | Admitting: Hospice and Palliative Medicine

## 2022-03-01 ENCOUNTER — Telehealth: Payer: Self-pay | Admitting: *Deleted

## 2022-03-01 ENCOUNTER — Inpatient Hospital Stay (HOSPITAL_BASED_OUTPATIENT_CLINIC_OR_DEPARTMENT_OTHER): Payer: Medicare Other | Admitting: Hospice and Palliative Medicine

## 2022-03-01 VITALS — BP 135/82 | HR 94 | Temp 98.4°F | Resp 18

## 2022-03-01 DIAGNOSIS — Z170 Estrogen receptor positive status [ER+]: Secondary | ICD-10-CM | POA: Diagnosis not present

## 2022-03-01 DIAGNOSIS — R7303 Prediabetes: Secondary | ICD-10-CM | POA: Diagnosis not present

## 2022-03-01 DIAGNOSIS — C50811 Malignant neoplasm of overlapping sites of right female breast: Secondary | ICD-10-CM | POA: Diagnosis not present

## 2022-03-01 DIAGNOSIS — Z79899 Other long term (current) drug therapy: Secondary | ICD-10-CM | POA: Diagnosis not present

## 2022-03-01 DIAGNOSIS — R21 Rash and other nonspecific skin eruption: Secondary | ICD-10-CM | POA: Diagnosis not present

## 2022-03-01 DIAGNOSIS — Z5111 Encounter for antineoplastic chemotherapy: Secondary | ICD-10-CM | POA: Diagnosis not present

## 2022-03-01 DIAGNOSIS — Z85038 Personal history of other malignant neoplasm of large intestine: Secondary | ICD-10-CM | POA: Diagnosis not present

## 2022-03-01 MED ORDER — PREDNISONE 10 MG PO TABS: ORAL_TABLET | ORAL | 0 refills | Status: DC

## 2022-03-01 NOTE — Progress Notes (Signed)
Patient here today for [facility] visit. Patient reports rash to neck and bottom. Patient reports itching started on Friday, rash to back of neck is raised, red and in clusters. Patient has not tried any OTC treatments for rash. She denies any other symptoms such as swelling or shortness of breath. Patient states she tolerated her treatment well last week with only 1 episode of nausea, resolved with medication.

## 2022-03-01 NOTE — Telephone Encounter (Signed)
Patient called reporting that she has developed a rash on her neck and bottom , it is very itchy and it started 3 days ago. It is getting worse daily. She has used Vaseline on her bottom but nothing else for the rash. Patient states that she plans on being at Cancer center this afternoon in the resource room. She said to please wait 15 minutes before you call her back as she is showering right now. She does have an appointment tomorrow with Judson Roch too

## 2022-03-01 NOTE — Telephone Encounter (Signed)
Unable to reach patient. Left vm x 2 for patient (via mobile and home line).  Sabrina Wilson is happy to see patient today or do a virtual visit with her. She has an apt with Judson Roch for labs/[facility] tomorrow to f/u on patient s/p first chemo treatment.  I noticed that she did not have a fluid encounter on for tom in case she needs any fluids since she is s/p new chemo. I added this apt on fluid clinic to hold apt slot for patient.

## 2022-03-01 NOTE — Telephone Encounter (Signed)
Pt was evaluated in [facility] today.- walk in. She declines any labs today and only wants to be seen for a rash today. She would like to keep apt w/Sarah, PA as scheduled tomorrow.

## 2022-03-01 NOTE — Progress Notes (Signed)
Symptom Management [place] at [facility] [phone] [phone]  Patient Care Team: Leone Haven, MD as PCP - General (Family Medicine) Kate Sable, MD as PCP - Cardiology (Cardiology) Bary Castilla Forest Gleason, MD as Consulting Physician (General Surgery) Dallas Schimke, MD (Internal Medicine) Daiva Huge, RN as Oncology Nurse Navigator   NAME OF PATIENT: Sabrina Wilson  [id number]  06-23-1940   DATE OF VISIT: 03/01/22  REASON FOR CONSULT: Sabrina Wilson is a 81 y.o. female with multiple medical problems including history of colon cancer status post resection and adjuvant chemotherapy now with recently diagnosed stage IIa ER/PR positive, HER2 negative right breast cancer.  Patient underwent surgical resection with wide excision and sentinel node biopsy on 01/21/2022.  This was followed by initiation of adjuvant TC chemotherapy and plan for future XRT.  INTERVAL HISTORY: Patient received cycle 1 TC chemotherapy on 02/23/2022.  She presents [facility] today for evaluation of a pruritic rash.  Patient reports developing a pruritic rash around her neck and upper chest starting 2 days after she received chemotherapy.  She feels like the rash is worsening with intense pruritus but not necessarily spreading.  She denies any swelling to face, tongue, or lips.  She denies rashes elsewhere.  No new creams, lotions, or medications.  Patient has not tried any over-the-counter treatments.  Denies any neurologic complaints. Denies recent fevers or illnesses. Denies any easy bleeding or bruising. Reports good appetite and denies weight loss. Denies chest pain. Denies any nausea, vomiting, constipation, or diarrhea. Denies urinary complaints. Patient offers no further specific complaints today.   PAST MEDICAL HISTORY: Past Medical History:  Diagnosis Date   Actinic keratosis 02/12/2020   R lat calf (hypertrophic)   Arthritis    Cancer of  sigmoid ([place]) 06/17/2016   Colon cancer ([place]) 2009   T3,N0; s/p resection  Dr. Hulda Humphrey and chemotherapy by Dr. Cynda Acres   Diastolic dysfunction    a. 08/2020 Echo: EF 60-65%, no rwma, Gr1 DD, nl RV size/fxn. Mild MR.   Hernia of flank    History of actinic keratoses 08/11/2020   Bx proven at left lateral ankle proximal, LN2 10/08/20   History of stress test    a. 09/2020 MV: EF >65%. No ischemia/infarct. Mild Ao Ca2+ and minimal Cor Ca2+.   Hypertension    Neuropathy    Polyp at cervical os    s/p resection Dr. Sabra Heck   Skin cancer 01/22/2020   surface of an atypical verrucous squamous proliferation    Squamous cell carcinoma of skin 07/16/2020   Left lateral ankle anterior, treated with [facility] 08-11-2020    PAST SURGICAL HISTORY:  Past Surgical History:  Procedure Laterality Date   BREAST BIOPSY Right 05/2014   Dr. Dwyane Luo office-benign   BREAST LUMPECTOMY WITH SENTINEL LYMPH NODE BIOPSY Right 01/21/2022   Procedure: BREAST LUMPECTOMY WITH SENTINEL LYMPH NODE BX;  Surgeon: Robert Bellow, MD;  Location: [facility] ORS;  Service: General;  Laterality: Right;   BREAST SURGERY Right February 2016   Vacuum assisted biopsy for recurrent cyst, fibrocystic changes without evidence of malignancy.   CATARACT EXTRACTION W/PHACO Left 01/13/2015   Procedure: CATARACT EXTRACTION PHACO AND INTRAOCULAR LENS PLACEMENT (IOC);  Surgeon: Birder Robson, MD;  Location: [facility] ORS;  Service: Ophthalmology;  Laterality: Left;  [place]  1:02.9 AP   19.2 CDE  12.06 casette lot #  [id number]   CATARACT EXTRACTION W/PHACO Right 02/03/2015   Procedure: CATARACT EXTRACTION PHACO AND INTRAOCULAR LENS PLACEMENT (  [place]);  Surgeon: Birder Robson, MD;  Location: [facility] ORS;  Service: Ophthalmology;  Laterality: Right;  [id number]:53 ap46.5 [id number]   COLECTOMY     COLONOSCOPY  2015   Dr Candace Cruise   COLONOSCOPY WITH PROPOFOL N/A 09/13/2017   Procedure: COLONOSCOPY WITH PROPOFOL;  Surgeon: Robert Bellow, MD;  Location: [facility] ENDOSCOPY;   Service: Endoscopy;  Laterality: N/A;   COLONOSCOPY WITH PROPOFOL N/A 11/08/2017   Procedure: COLONOSCOPY WITH PROPOFOL;  Surgeon: Robert Bellow, MD;  Location: [facility] ENDOSCOPY;  Service: Endoscopy;  Laterality: N/A;   colostomy reversal     DILATION AND CURETTAGE OF UTERUS  2014   Dr. Sabra Heck, benign per pt   EYE SURGERY     HERNIA REPAIR  07/13/12   ventral    SKIN CANCER EXCISION     TONSILLECTOMY      HEMATOLOGY/ONCOLOGY HISTORY:  Oncology History Overview Note  # 2009- Sigmoid colon cancer stage II (T3 N0 4 lymph nodes sampled M0 stage II high risk there was obstruction and perforation abscess; Dr.Hern), workup included a low CEA preoperatively and a chest x-ray and CT scan abdomen pelvis negative for metastatic disease, S/P colostomy; s/p FOLFOX x 6 months. [Dr.Gittin ]  # LATER REVERSED , K RAS  wild type B RAF not evaluated   Also initial iron deficiency with anemia thrombocytosis, high platelets considered reactive, workup includes a normal PCR for CML and a negative [id number] mutation  DIAGNOSIS:  A. BREAST, RIGHT; LUMPECTOMY:  - INVASIVE MAMMARY CARCINOMA.  - DUCTAL CARCINOMA IN SITU (DCIS).  - SEE CANCER SUMMARY BELOW.  - BIOPSY SITE CHANGE WITH CLIP (2).  - FIBROUS TISSUE WITH CYST FORMATION AND FLORID DUCTAL HYPERPLASIA.  - SKIN AND NIPPLE NEGATIVE FOR MALIGNANCY.  - VASCULAR CALCIFICATION.   B. SENTINEL LYMPH NODES 1 AND 2, RIGHT AXILLA; EXCISION:  - METASTATIC CARCINOMA INVOLVES ONE OF TWO LYMPH NODES (1/2).   C. NON-SENTINEL LYMPH NODES, RIGHT AXILLA; EXCISION:  - ONE LYMPH NODE NEGATIVE FOR MALIGNANCY (0/1).   CANCER CASE SUMMARY: INVASIVE CARCINOMA OF THE BREAST  Standard(s): AJCC-UICC 8   SPECIMEN  Procedure: Lumpectomy  Specimen Laterality: Right   TUMOR  Histologic Type: Invasive carcinoma of no special type (ductal)  Histologic Grade (Nottingham Histologic Score)       Glandular (Acinar)/Tubular Differentiation: 3       Nuclear Pleomorphism: 3        Mitotic Rate: 3       Overall Grade: 3  Tumor Size: 24 mm  Tumor Focality: Single focus of invasive carcinoma  Ductal Carcinoma In Situ (DCIS): Present, nuclear grade 2-3  Tumor Extent: Not applicable  Lymphatic and/or Vascular Invasion: Present  Treatment Effect in the Breast: No known presurgical therapy   MARGINS  Margin Status for Invasive Carcinoma: All margins negative for invasive  carcinoma       Distance from closest margin: 10 mm       Specify closest margin: [place]   Margin Status for DCIS: All margins negative for DCIS       Distance from DCIS to closest margin: 10 mm       Specify closest margin: [place]   REGIONAL LYMPH NODES  Regional Lymph Node Status: Tumor present in regional lymph node(s)       Number of Lymph Nodes with Macrometastases (greater than 2 mm): 1       Number of Lymph Nodes with Micrometastases (greater than 0.2 mm to  2 mm and/or greater than 200 cells): 0  Number of Lymph Nodes with Isolated Tumor Cells (0.2 mm or less OR  200 cells or less): 0       Size of Largest Metastatic Deposit: 3 mm      Extranodal Extension: Not identified       Total Number of Lymph Nodes Examined (sentinel and non-sentinel): 3       Number of Sentinel Nodes Examined: 2   DISTANT METASTASIS  Distant Site(s) Involved, if applicable: Not applicable   PATHOLOGIC STAGE CLASSIFICATION (pTNM, AJCC 8th Edition):  Modified Classification: Not applicable  pT Category: pT2  T Suffix: Not applicable  pN Category: pN1a  N Suffix: (sn)  pM Category: Not applicable   SPECIAL STUDIES  Breast Biomarker Testing Performed on Previous Outside Biopsy:  23-250-T11  Per outside report:  Estrogen Receptor (ER) Status: POSITIVE          Percentage of cells with nuclear positivity: Greater than 90%          Average intensity of staining: Strong   Progesterone Receptor (PgR) Status: POSITIVE          Percentage of cells with nuclear positivity: 60%          Average  intensity of staining: Strong   HER2 (by immunohistochemistry): EQUIVOCAL (Score 2+)  HER2 FISH: NEGATIVE   Ki-67: Not performed   IMPRESSION: 1. Complex cystic and solid-appearing mass within the subareolar RIGHT breast, overall measuring 3.1 cm greatest dimension, the solid enhancing component at the anterior-lateral aspect of the mass measuring 2 x 1 cm, abutting the overlying skin but without evidence of involvement/enhancement of the skin. Susceptibility artifact within the mass may represent a biopsy clip (if biopsy has been performed at an outside site since the diagnostic mammogram/ultrasound of 12/28/2021) or this artifact could represent calcifications within the mass. This mass corresponds to the recent ultrasound finding of a complex irregular mass at the 11 o'clock axis of the retroareolar RIGHT breast. 2. No evidence of multifocal or multicentric disease within the RIGHT breast. 3. No evidence of contralateral disease in the LEFT breast.   RECOMMENDATION: If has not already been performed, recommend ultrasound-guided biopsy of the suspicious mass in the subareolar RIGHT breast, with preferential targeting of the anterior-lateral component of the mass which is shown to be the enhancing component on today's MRI.   BI-RADS CATEGORY  4: Suspicious.     Cancer of sigmoid ([place]) (Resolved)  Carcinoma of overlapping sites of right breast in female, estrogen receptor positive ([place])  02/14/2022 Initial Diagnosis   Carcinoma of overlapping sites of right breast in female, estrogen receptor positive ([place])   02/14/2022 Cancer Staging   Staging form: Breast, AJCC 8th Edition - Pathologic: Stage IIA (pT2, pN1, cM0, G3, ER+, PR+, HER2-) - Signed by Cammie Sickle, MD on 02/14/2022 Histologic grading system: 3 grade system   02/23/2022 -  Chemotherapy   Patient is on Treatment Plan : BREAST TC q21d       ALLERGIES:  is allergic to sulfa antibiotics.  MEDICATIONS:   Current Outpatient Medications  Medication Sig Dispense Refill   atorvastatin (LIPITOR) 10 MG tablet TAKE ONE TABLET BY MOUTH DAILY 90 tablet 1   Calcium Carbonate-Vit D-Min (CALCIUM 1200 PO) Take by mouth daily.     clobetasol ointment (TEMOVATE) 6.43 % Apply 1 application topically 2 (two) times daily. To affected areas of rash for up to 2-3 weeks. Avoid applying to face, groin, and axilla. Use as directed. Long-term use can cause thinning of the  skin. 45 g 1   dexamethasone (DECADRON) 4 MG tablet Take 1 tablet (4 mg total) by mouth 2 (two) times daily with a meal. Start 1 days prior to chemo. Take for 1 day ONLY.  DO NOT take on the day of chemo. 60 tablet 0   hydrochlorothiazide (HYDRODIURIL) 12.5 MG tablet TAKE ONE TABLET BY MOUTH DAILY 90 tablet 1   niacinamide 500 MG tablet Take 500 mg by mouth 2 (two) times daily.     Omega-3 Fatty Acids (FISH OIL PO) Take by mouth daily.     ondansetron (ZOFRAN) 8 MG tablet One pill every 8 hours as needed for nausea/vomitting. 40 tablet 1   prochlorperazine (COMPAZINE) 10 MG tablet Take 1 tablet (10 mg total) by mouth every 6 (six) hours as needed for nausea or vomiting. 40 tablet 1   Ruxolitinib Phosphate (OPZELURA) 1.5 % CREA Apply 1 application topically daily. 60 g 2   sodium fluoride (FLUORISHIELD) 1.1 % GEL dental gel Place onto teeth.     VITAMIN D, CHOLECALCIFEROL, PO Take 2,000 Units by mouth daily.     No current facility-administered medications for this visit.    VITAL SIGNS: There were no vitals taken for this visit. There were no vitals filed for this visit.  Estimated body mass index is 30.65 kg/m as calculated from the following:   Height as of 02/23/22: 5' 5" (1.651 m).   Weight as of 02/23/22: 184 lb 3.2 oz (83.6 kg).  LABS: CBC:    Component Value Date/Time   WBC 9.7 02/23/2022 0814   HGB 12.9 02/23/2022 0814   HGB 12.4 05/15/2013 1445   HCT 38.5 02/23/2022 0814   HCT 38.2 05/15/2013 1445   PLT 244 02/23/2022 0814    PLT 255 05/15/2013 1445   MCV 97.5 02/23/2022 0814   MCV 96 05/15/2013 1445   NEUTROABS 7.5 02/23/2022 0814   NEUTROABS 3.9 05/15/2013 1445   LYMPHSABS 1.4 02/23/2022 0814   LYMPHSABS 2.0 05/15/2013 1445   MONOABS 0.7 02/23/2022 0814   MONOABS 0.6 05/15/2013 1445   EOSABS 0.0 02/23/2022 0814   EOSABS 0.4 05/15/2013 1445   BASOSABS 0.0 02/23/2022 0814   BASOSABS 0.1 05/15/2013 1445   Comprehensive Metabolic Panel:    Component Value Date/Time   NA 136 02/23/2022 0814   NA 133 (L) 07/15/2012 2105   K 4.0 02/23/2022 0814   K 3.8 07/18/2012 0503   CL 102 02/23/2022 0814   CL 100 07/15/2012 2105   CO2 26 02/23/2022 0814   CO2 28 07/15/2012 2105   BUN 21 02/23/2022 0814   BUN 10 07/15/2012 2105   CREATININE 0.78 02/23/2022 0814   CREATININE 0.68 07/15/2012 2105   GLUCOSE 145 (H) 02/23/2022 0814   GLUCOSE 136 (H) 07/15/2012 2105   CALCIUM 10.1 02/23/2022 0814   CALCIUM 8.4 (L) 07/15/2012 2105   AST 25 02/23/2022 0814   AST 14 (L) 06/22/2011 1323   ALT 20 02/23/2022 0814   ALT 24 06/22/2011 1323   ALKPHOS 59 02/23/2022 0814   ALKPHOS 72 06/22/2011 1323   BILITOT 0.4 02/23/2022 0814   BILITOT 0.4 06/22/2011 1323   PROT 7.5 02/23/2022 0814   PROT 7.5 06/22/2011 1323   ALBUMIN 4.2 02/23/2022 0814   ALBUMIN 3.8 06/22/2011 1323    RADIOGRAPHIC STUDIES: No results found.  PERFORMANCE STATUS (ECOG) : 1 - Symptomatic but completely ambulatory  Review of Systems Unless otherwise noted, a complete review of systems is negative.  Physical Exam General: NAD Cardiovascular:  regular rate and rhythm Pulmonary: clear ant fields Abdomen: soft, nontender, + bowel sounds GU: no suprapubic tenderness Extremities: no edema, no joint deformities Skin: Erythematous, confluent rash to upper chest/neck Neurological: Grossly nonfocal    IMPRESSION/PLAN: Rash -suspect drug rash, most likely stemming from recent initiation of chemotherapy.  We will start prednisone taper.  May use  cool compresses to avoid scratching.  Patient will have follow-up tomorrow in clinic for further evaluation.   Patient expressed understanding and was in agreement with this plan. She also understands that She can call clinic at any time with any questions, concerns, or complaints.   Thank you for allowing me to participate in the care of this very pleasant patient.   Time Total: 15 minutes  Visit consisted of counseling and education dealing with the complex and emotionally intense issues of symptom management in the setting of serious illness.Greater than 50%  of this time was spent counseling and coordinating care related to the above assessment and plan.  Signed by: Altha Harm, PhD, NP-C

## 2022-03-02 ENCOUNTER — Encounter: Payer: Self-pay | Admitting: Medical Oncology

## 2022-03-02 ENCOUNTER — Inpatient Hospital Stay: Payer: Medicare Other

## 2022-03-02 ENCOUNTER — Inpatient Hospital Stay (HOSPITAL_BASED_OUTPATIENT_CLINIC_OR_DEPARTMENT_OTHER): Payer: Medicare Other | Admitting: Medical Oncology

## 2022-03-02 VITALS — BP 121/75 | HR 91 | Temp 97.9°F | Resp 16 | Wt 185.0 lb

## 2022-03-02 DIAGNOSIS — Z170 Estrogen receptor positive status [ER+]: Secondary | ICD-10-CM

## 2022-03-02 DIAGNOSIS — C50811 Malignant neoplasm of overlapping sites of right female breast: Secondary | ICD-10-CM | POA: Diagnosis not present

## 2022-03-02 DIAGNOSIS — R7303 Prediabetes: Secondary | ICD-10-CM | POA: Diagnosis not present

## 2022-03-02 DIAGNOSIS — Z79899 Other long term (current) drug therapy: Secondary | ICD-10-CM | POA: Diagnosis not present

## 2022-03-02 DIAGNOSIS — R21 Rash and other nonspecific skin eruption: Secondary | ICD-10-CM | POA: Diagnosis not present

## 2022-03-02 DIAGNOSIS — Z85038 Personal history of other malignant neoplasm of large intestine: Secondary | ICD-10-CM | POA: Diagnosis not present

## 2022-03-02 DIAGNOSIS — Z5111 Encounter for antineoplastic chemotherapy: Secondary | ICD-10-CM | POA: Diagnosis not present

## 2022-03-02 LAB — CBC WITH DIFFERENTIAL/PLATELET
Abs Immature Granulocytes: 3.04 10*3/uL — ABNORMAL HIGH (ref 0.00–0.07)
Basophils Absolute: 0 10*3/uL (ref 0.0–0.1)
Basophils Relative: 0 %
Eosinophils Absolute: 0.2 10*3/uL (ref 0.0–0.5)
Eosinophils Relative: 1 %
HCT: 36.2 % (ref 36.0–46.0)
Hemoglobin: 12.2 g/dL (ref 12.0–15.0)
Immature Granulocytes: 18 %
Lymphocytes Relative: 14 %
Lymphs Abs: 2.4 10*3/uL (ref 0.7–4.0)
MCH: 33 pg (ref 26.0–34.0)
MCHC: 33.7 g/dL (ref 30.0–36.0)
MCV: 97.8 fL (ref 80.0–100.0)
Monocytes Absolute: 3.3 10*3/uL — ABNORMAL HIGH (ref 0.1–1.0)
Monocytes Relative: 19 %
Neutro Abs: 8.3 10*3/uL — ABNORMAL HIGH (ref 1.7–7.7)
Neutrophils Relative %: 48 %
Platelets: 204 10*3/uL (ref 150–400)
RBC: 3.7 MIL/uL — ABNORMAL LOW (ref 3.87–5.11)
RDW: 12.7 % (ref 11.5–15.5)
Smear Review: NORMAL
WBC: 17.2 10*3/uL — ABNORMAL HIGH (ref 4.0–10.5)
nRBC: 0.3 % — ABNORMAL HIGH (ref 0.0–0.2)

## 2022-03-02 LAB — BASIC METABOLIC PANEL
Anion gap: 10 (ref 5–15)
BUN: 15 mg/dL (ref 8–23)
CO2: 25 mmol/L (ref 22–32)
Calcium: 10 mg/dL (ref 8.9–10.3)
Chloride: 96 mmol/L — ABNORMAL LOW (ref 98–111)
Creatinine, Ser: 0.85 mg/dL (ref 0.44–1.00)
GFR, Estimated: >60 mL/min (ref >60)
Glucose, Bld: 115 mg/dL — ABNORMAL HIGH (ref 70–99)
Potassium: 3.8 mmol/L (ref 3.5–5.1)
Sodium: 131 mmol/L — ABNORMAL LOW (ref 135–145)

## 2022-03-02 NOTE — Progress Notes (Addendum)
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 [Sabrina Wilson ]  # LATER REVERSED , K RAS  wild type B RAF not evaluated   Also initial iron deficiency with anemia thrombocytosis, high platelets considered reactive, workup includes a normal PCR for CML and a negative [id number] mutation  DIAGNOSIS:  A. BREAST, RIGHT; LUMPECTOMY:  - INVASIVE MAMMARY CARCINOMA.  - DUCTAL CARCINOMA IN SITU (DCIS).  - SEE CANCER SUMMARY BELOW.  - BIOPSY SITE CHANGE WITH CLIP (2).  - FIBROUS TISSUE WITH CYST FORMATION AND FLORID DUCTAL HYPERPLASIA.  - SKIN AND NIPPLE NEGATIVE FOR MALIGNANCY.  - VASCULAR CALCIFICATION.   B. SENTINEL LYMPH NODES 1 AND 2, RIGHT AXILLA; EXCISION:  - METASTATIC CARCINOMA INVOLVES ONE OF TWO LYMPH NODES (1/2).   C. NON-SENTINEL LYMPH NODES, RIGHT AXILLA; EXCISION:  - ONE LYMPH NODE NEGATIVE FOR MALIGNANCY (0/1).   CANCER CASE SUMMARY: INVASIVE CARCINOMA OF THE BREAST  Standard(s): AJCC-UICC 8   SPECIMEN  Procedure: Lumpectomy  Specimen Laterality: Right   TUMOR  Histologic Type: Invasive carcinoma of no special type (ductal)  Histologic Grade (Nottingham Histologic Score)       Glandular (Acinar)/Tubular Differentiation: 3       Nuclear Pleomorphism: 3       Mitotic Rate: 3       Overall Grade: 3  Tumor Size:  24 mm  Tumor Focality: Single focus of invasive carcinoma  Ductal Carcinoma In Situ (DCIS): Present, nuclear grade 2-3  Tumor Extent: Not applicable  Lymphatic and/or Vascular Invasion: Present  Treatment Effect in the Breast: No known presurgical therapy   MARGINS  Margin Status for Invasive Carcinoma: All margins negative for invasive  carcinoma       Distance from closest margin: 10 mm       Specify closest margin: Superior   Margin Status for DCIS: All margins negative for DCIS       Distance from DCIS to closest margin: 10 mm       Specify closest margin: Superior   REGIONAL LYMPH NODES  Regional Lymph Node Status: Tumor present in regional lymph node(s)       Number of Lymph Nodes with Macrometastases (greater than 2 mm): 1       Number of Lymph Nodes with Micrometastases (greater than 0.2 mm to  2 mm and/or greater than 200 cells): 0       Number of Lymph Nodes with Isolated Tumor Cells (0.2 mm or less OR  200 cells or less): 0       Size of Largest Metastatic Deposit: 3 mm      Extranodal Extension: Not identified       Total Number of Lymph Nodes Examined (sentinel and non-sentinel): 3       Number of Sentinel Nodes Examined: 2   DISTANT METASTASIS  Distant Site(s) Involved, if applicable: Not applicable  PATHOLOGIC STAGE CLASSIFICATION (pTNM, AJCC 8th Edition):  Modified Classification: Not applicable  pT Category: pT2  T Suffix: Not applicable  pN Category: pN1a  N Suffix: (sn)  pM Category: Not applicable   SPECIAL STUDIES  Breast Biomarker Testing Performed on Previous Outside Biopsy:  [id number]  Per outside report:  Estrogen Receptor (ER) Status: POSITIVE          Percentage of cells with nuclear positivity: Greater than 90%          Average intensity of staining: Strong   Progesterone Receptor (PgR) Status: POSITIVE          Percentage of cells with nuclear positivity: 60%          Average intensity of staining: Strong   HER2 (by  immunohistochemistry): EQUIVOCAL (Score 2+)  HER2 FISH: NEGATIVE   Ki-67: Not performed   IMPRESSION: 1. Complex cystic and solid-appearing mass within the subareolar RIGHT breast, overall measuring 3.1 cm greatest dimension, the solid enhancing component at the anterior-lateral aspect of the mass measuring 2 x 1 cm, abutting the overlying skin but without evidence of involvement/enhancement of the skin. Susceptibility artifact within the mass may represent a biopsy clip (if biopsy has been performed at an outside site since the diagnostic mammogram/ultrasound of 12/28/2021) or this artifact could represent calcifications within the mass. This mass corresponds to the recent ultrasound finding of a complex irregular mass at the 11 o'clock axis of the retroareolar RIGHT breast. 2. No evidence of multifocal or multicentric disease within the RIGHT breast. 3. No evidence of contralateral disease in the LEFT breast.   RECOMMENDATION: If has not already been performed, recommend ultrasound-guided biopsy of the suspicious mass in the subareolar RIGHT breast, with preferential targeting of the anterior-lateral component of the mass which is shown to be the enhancing component on today's MRI.   BI-RADS CATEGORY  4: Suspicious.     Cancer of sigmoid ([place]) (Resolved)  Carcinoma of overlapping sites of right breast in female, estrogen receptor positive ([place])  02/14/2022 Initial Diagnosis   Carcinoma of overlapping sites of right breast in female, estrogen receptor positive ([place])   02/14/2022 Cancer Staging   Staging form: Breast, AJCC 8th Edition - Pathologic: Stage IIA (pT2, pN1, cM0, G3, ER+, PR+, HER2-) - Signed by Sabrina Sickle, MD on 02/14/2022 Histologic grading system: 3 grade system   02/23/2022 -  Chemotherapy   Patient is on Treatment Plan : BREAST TC q21d        HISTORY OF PRESENTING ILLNESS:   Alone. Ambulating independently.   Sabrina Wilson 81 y.o.  female  female with no prior history of breast cancer/or malignancies who is here for follow up after her first round of D1 Taxoeter-Cytoxan; D-2- undeyca chemotherapy.    Was started on D1 Taxoeter-Cytoxan; D-2- undeyca chemotherapy with first round on 02/23/2022. She then developed an itchy rash on 02/28/2022. She was seen yesterday by [facility] and given prednisone taper for suspected drug rash. She reports that she started the prednisone late this morning. No improvements of the rash as of yet. Rash has spread to the other side of her neck but is not located elsewhere. Rash is itchy. No SOB, cough, fever, oral symptoms, tongue swelling, facial swelling. The only other symptoms she has had since chemo have been a mild headache and some occasional joint pains. No N/V/D/C.   Review of Systems  Constitutional:  Negative for chills, diaphoresis, fever, malaise/fatigue and weight loss.  HENT:  Negative for nosebleeds and  sore throat.   Eyes:  Negative for double vision.  Respiratory:  Negative for cough, hemoptysis, sputum production, shortness of breath and wheezing.   Cardiovascular:  Negative for chest pain, palpitations, orthopnea and leg swelling.  Gastrointestinal:  Negative for abdominal pain, blood in stool, constipation, diarrhea, heartburn, melena, nausea and vomiting.  Genitourinary:  Negative for dysuria, frequency and urgency.  Musculoskeletal:  Negative for back pain and joint pain.  Skin:  Positive for itching and rash.  Neurological:  Negative for dizziness, tingling, focal weakness, weakness and headaches.  Endo/Heme/Allergies:  Does not bruise/bleed easily.  Psychiatric/Behavioral:  Negative for depression. The patient is not nervous/anxious and does not have insomnia.      MEDICAL HISTORY:  Past Medical History:  Diagnosis Date   Actinic keratosis 02/12/2020   R lat calf (hypertrophic)   Arthritis    Cancer of sigmoid ([place]) 06/17/2016   Colon cancer ([place]) 2009   T3,N0; s/p resection   Dr. Hulda Humphrey and chemotherapy by Dr. Cynda Acres   Diastolic dysfunction    a. 08/2020 Echo: EF 60-65%, no rwma, Gr1 DD, nl RV size/fxn. Mild MR.   Hernia of flank    History of actinic keratoses 08/11/2020   Bx proven at left lateral ankle proximal, LN2 10/08/20   History of stress test    a. 09/2020 MV: EF >65%. No ischemia/infarct. Mild Ao Ca2+ and minimal Cor Ca2+.   Hypertension    Neuropathy    Polyp at cervical os    s/p resection Dr. Sabra Heck   Skin cancer 01/22/2020   surface of an atypical verrucous squamous proliferation    Squamous cell carcinoma of skin 07/16/2020   Left lateral ankle anterior, [facility] with [facility] 08-11-2020    SURGICAL HISTORY: Past Surgical History:  Procedure Laterality Date   BREAST BIOPSY Right 05/2014   Dr. Dwyane Luo office-benign   BREAST LUMPECTOMY WITH SENTINEL LYMPH NODE BIOPSY Right 01/21/2022   Procedure: BREAST LUMPECTOMY WITH SENTINEL LYMPH NODE BX;  Surgeon: Robert Bellow, MD;  Location: [facility] ORS;  Service: General;  Laterality: Right;   BREAST SURGERY Right February 2016   Vacuum assisted biopsy for recurrent cyst, fibrocystic changes without evidence of malignancy.   CATARACT EXTRACTION W/PHACO Left 01/13/2015   Procedure: CATARACT EXTRACTION PHACO AND INTRAOCULAR LENS PLACEMENT (IOC);  Surgeon: Birder Robson, MD;  Location: [facility] ORS;  Service: Ophthalmology;  Laterality: Left;  [place]  1:02.9 AP   19.2 CDE  12.06 casette lot #  [id number]   CATARACT EXTRACTION W/PHACO Right 02/03/2015   Procedure: CATARACT EXTRACTION PHACO AND INTRAOCULAR LENS PLACEMENT (IOC);  Surgeon: Birder Robson, MD;  Location: [facility] ORS;  Service: Ophthalmology;  Laterality: Right;  us00:53 ap46.5 [id number]   COLECTOMY     COLONOSCOPY  2015   Dr Candace Cruise   COLONOSCOPY WITH PROPOFOL N/A 09/13/2017   Procedure: COLONOSCOPY WITH PROPOFOL;  Surgeon: Robert Bellow, MD;  Location[facility] ENDOSCOPY;  Service: Endoscopy;  Laterality: N/A;   COLONOSCOPY WITH PROPOFOL N/A 11/08/2017    Procedure: COLONOSCOPY WITH PROPOFOL;  Surgeon: Robert Bellow, MD;  Location: [facility] ENDOSCOPY;  Service: Endoscopy;  Laterality: N/A;   colostomy reversal     DILATION AND CURETTAGE OF UTERUS  2014   Dr. Sabra Heck, benign per pt   EYE SURGERY     HERNIA REPAIR  07/13/12   ventral    SKIN CANCER EXCISION     TONSILLECTOMY      SOCIAL HISTORY: Social History   Socioeconomic History   Marital status: Married  Spouse name: Not on file   Number of children: Not on file   Years of education: Not on file   Highest education level: Not on file  Occupational History   Not on file  Tobacco Use   Smoking status: Former    Types: Cigarettes    Quit date: 10/12/2007    Years since quitting: 14.3   Smokeless tobacco: Never  Vaping Use   Vaping Use: Never used  Substance and Sexual Activity   Alcohol use: Yes    Alcohol/week: 1.0 standard drink of alcohol    Types: 1 Standard drinks or equivalent per week    Comment: with dinner GLASS OF WINE EACH DAY   Drug use: No   Sexual activity: Not on file  Other Topics Concern   Not on file  Social History Narrative   Lives in [place] with husband. 2 children, son lives nearby.Diet - regularExercise - none. 30 mins/in [employer]. Quit smoking in 2009. Wine before dinner. Pianist in church; real estate; Adult nurse.    Social Determinants of Health   Financial Resource Strain: Low Risk  (11/13/2018)   Overall Financial Resource Strain (CARDIA)    Difficulty of Paying Living Expenses: Not hard at all  Food Insecurity: No Food Insecurity (11/16/2020)   Hunger Vital Sign    Worried About Running Out of Food in the Last Year: Never true    Ran Out of Food in the Last Year: Never true  Transportation Needs: No Transportation Needs (11/16/2020)   PRAPARE - Hydrologist (Medical): No    Lack of Transportation (Non-Medical): No  Physical Activity: Not on file  Stress: No Stress Concern Present (11/16/2020)    [place]    Feeling of Stress : Not at all  Social Connections: Not on file  Intimate Partner Violence: Not At Risk (11/16/2020)   Humiliation, Afraid, Rape, and Kick questionnaire    Fear of Current or Ex-Partner: No    Emotionally Abused: No    Physically Abused: No    Sexually Abused: No    FAMILY HISTORY: Family History  Problem Relation Age of Onset   Stroke Mother    Diabetes Mother    Cancer Father        colon   Stroke Sister    Cancer Brother        colon   Cancer Brother        brain    ALLERGIES:  is allergic to sulfa antibiotics.  MEDICATIONS:  Current Outpatient Medications  Medication Sig Dispense Refill   atorvastatin (LIPITOR) 10 MG tablet TAKE ONE TABLET BY MOUTH DAILY 90 tablet 1   Calcium Carbonate-Vit D-Min (CALCIUM 1200 PO) Take by mouth daily.     clobetasol ointment (TEMOVATE) 8.41 % Apply 1 application topically 2 (two) times daily. To affected areas of rash for up to 2-3 weeks. Avoid applying to face, groin, and axilla. Use as directed. Long-term use can cause thinning of the skin. 45 g 1   hydrochlorothiazide (HYDRODIURIL) 12.5 MG tablet TAKE ONE TABLET BY MOUTH DAILY 90 tablet 1   niacinamide 500 MG tablet Take 500 mg by mouth 2 (two) times daily.     Omega-3 Fatty Acids (FISH OIL PO) Take by mouth daily.     predniSONE (DELTASONE) 10 MG tablet Take 6 tablets (60 mg) x1 day, then take 5 tablets (50 mg) x1 day, then take 4 tablets (40 mg) x1  day, then take 3 tablets (30 mg) x1 day, then take 2 tablets (20 mg) x1 day, then take 1 tablet (10 mg) x1 day, then stop 21 tablet 0   Ruxolitinib Phosphate (OPZELURA) 1.5 % CREA Apply 1 application topically daily. 60 g 2   sodium fluoride (FLUORISHIELD) 1.1 % GEL dental gel Place onto teeth.     dexamethasone (DECADRON) 4 MG tablet Take 1 tablet (4 mg total) by mouth 2 (two) times daily with a meal. Start 1 days prior to chemo. Take for 1 day  ONLY.  DO NOT take on the day of chemo. (Patient not taking: Reported on 03/02/2022) 60 tablet 0   ondansetron (ZOFRAN) 8 MG tablet One pill every 8 hours as needed for nausea/vomitting. (Patient not taking: Reported on 03/02/2022) 40 tablet 1   prochlorperazine (COMPAZINE) 10 MG tablet Take 1 tablet (10 mg total) by mouth every 6 (six) hours as needed for nausea or vomiting. (Patient not taking: Reported on 03/02/2022) 40 tablet 1   VITAMIN D, CHOLECALCIFEROL, PO Take 2,000 Units by mouth daily.     No current facility-administered medications for this visit.   PHYSICAL EXAMINATION:  Vitals:   03/02/22 1322  BP: 121/75  Pulse: 91  Resp: 16  Temp: 97.9 F (36.6 C)  SpO2: 98%   Filed Weights   03/02/22 1322  Weight: 185 lb (83.9 kg)    Physical Exam Vitals and nursing Wilson reviewed.  HENT:     Head: Normocephalic and atraumatic.     Mouth/Throat:     Pharynx: Oropharynx is clear.  Eyes:     Extraocular Movements: Extraocular movements intact.     Pupils: Pupils are equal, round, and reactive to light.  Cardiovascular:     Rate and Rhythm: Normal rate and regular rhythm.  Pulmonary:     Comments: Decreased breath sounds bilaterally.  Abdominal:     Palpations: Abdomen is soft.  Musculoskeletal:        General: Normal range of motion.     Cervical back: Normal range of motion.  Skin:    General: Skin is warm.     Findings: Rash present.     Comments: See below  Neurological:     General: No focal deficit present.     Mental Status: She is alert and oriented to person, place, and time.  Psychiatric:        Behavior: Behavior normal.        Judgment: Judgment normal.         LABORATORY DATA:  I have reviewed the data as listed Lab Results  Component Value Date   WBC 17.2 (H) 03/02/2022   HGB 12.2 03/02/2022   HCT 36.2 03/02/2022   MCV 97.8 03/02/2022   PLT 204 03/02/2022   Recent Labs    05/24/21 1447 01/19/22 0919 02/14/22 1546 02/23/22 0814  03/02/22 1249  NA 137   < > 134* 136 131*  K 4.0   < > 3.5 4.0 3.8  CL 98   < > 96* 102 96*  CO2 32   < > _0 GLUCOSE 91   < > 104* 145* 115*  BUN 21   < > _1 CREATININE 0.86   < > 0.84 0.78 0.85  CALCIUM 10.4   < > 10.0 10.1 10.0  GFRNONAA  --    < > >60 >60 >60  PROT 6.8  --  8.1 7.5  --   ALBUMIN 4.3  --  4.4 4.2  --   AST 17  --  29 25  --   ALT 11  --  20 20  --   ALKPHOS 49  --  65 59  --   BILITOT 0.6  --  0.7 0.4  --    < > = values in this interval not displayed.    RADIOGRAPHIC STUDIES: I have personally reviewed the radiological images as listed and agreed with the findings in the report. No results found.  ASSESSMENT & PLAN:   Carcinoma of overlapping sites of right breast in female, estrogen receptor positive ([place]) # Right breast cancer -[facility]; ER;PR positive G-3; LVI + T2N1.  Stage II [OCT 2023; Dr.Byrnett] s/p Lumpectomy. Genetic testing recommended down the line per Dr. Tish Men given brother/father. Current plan is adjuvant chemotherapy with Taxotere-Cytoxan every 3 weeks for 4 cycles.  Also considering abema x2 years. She had cycle one on 02/23/2022. She developed a rash of her chest requiring steroids. She will continue the steroids at this time. She can add on an H2 blocker- pepcid once daily and benadryl nightly PRN. Will reach out to on call provider on further guidance given reaction.   UPDATE:PER Dr. Grayland Ormond will need her Pre meds maximized for next tx to help avoid this from occurring again.    # Prediabetes-monitor closely on steroids/chemotherapy. Today her glucose is 115. We will continue to monitor.    # Hx of colon cancer stage III- s/p FOLFOX [2009]-residual neuropathy which does not seem to be worsened following 1st round of chemotherapy.   # DISPOSITION: RTC Monday APP rash follow up- no labs.  Keep follow up on 27th NOV that was previously scheduled- MD: labs- cbc/cmp; Taxoeter-Cytoxan; D-2- Margart Sickles PER Dr. Grayland Ormond will need her  Pre meds maximized for next tx to help avoid this from occurring again.   All questions were answered. The patient/family knows to call the clinic with any problems, questions or concerns.     Hughie Closs, PA-C 03/02/2022 2:46 PM

## 2022-03-02 NOTE — Progress Notes (Signed)
Pt was seen yesterday with a rash to neck and chest and started prednisone taper today. Pt already had follow up appt scheduled for today and was instructed to keep appt.

## 2022-03-07 ENCOUNTER — Inpatient Hospital Stay (HOSPITAL_BASED_OUTPATIENT_CLINIC_OR_DEPARTMENT_OTHER): Payer: Medicare Other | Admitting: Nurse Practitioner

## 2022-03-07 ENCOUNTER — Encounter: Payer: Self-pay | Admitting: Nurse Practitioner

## 2022-03-07 VITALS — BP 156/82 | HR 83 | Temp 97.0°F | Wt 189.0 lb

## 2022-03-07 DIAGNOSIS — T451X5A Adverse effect of antineoplastic and immunosuppressive drugs, initial encounter: Secondary | ICD-10-CM | POA: Diagnosis not present

## 2022-03-07 DIAGNOSIS — Z85038 Personal history of other malignant neoplasm of large intestine: Secondary | ICD-10-CM | POA: Diagnosis not present

## 2022-03-07 DIAGNOSIS — R7303 Prediabetes: Secondary | ICD-10-CM | POA: Diagnosis not present

## 2022-03-07 DIAGNOSIS — Z5111 Encounter for antineoplastic chemotherapy: Secondary | ICD-10-CM | POA: Diagnosis not present

## 2022-03-07 DIAGNOSIS — C50811 Malignant neoplasm of overlapping sites of right female breast: Secondary | ICD-10-CM | POA: Diagnosis not present

## 2022-03-07 DIAGNOSIS — Z170 Estrogen receptor positive status [ER+]: Secondary | ICD-10-CM | POA: Diagnosis not present

## 2022-03-07 DIAGNOSIS — R239 Unspecified skin changes: Secondary | ICD-10-CM | POA: Diagnosis not present

## 2022-03-07 DIAGNOSIS — R21 Rash and other nonspecific skin eruption: Secondary | ICD-10-CM | POA: Diagnosis not present

## 2022-03-07 DIAGNOSIS — Z79899 Other long term (current) drug therapy: Secondary | ICD-10-CM | POA: Diagnosis not present

## 2022-03-07 NOTE — Progress Notes (Signed)
[name] CONSULT NOTE  Patient Care Team: Sabrina Haven, MD as PCP - General (Family Medicine) Sabrina Sable, MD as PCP - Cardiology (Cardiology) Sabrina Castilla Forest Gleason, MD as Consulting Physician (General Surgery) Sabrina Schimke, MD (Internal Medicine) Sabrina Huge, RN as Oncology Nurse Navigator  CHIEF COMPLAINTS/PURPOSE OF CONSULTATION: Breast cancer  #  Oncology History Overview Note  # 2009- Sigmoid colon cancer stage II (T3 N0 4 lymph nodes sampled M0 stage II high risk there was obstruction and perforation abscess; Dr.Hern), workup included a low CEA preoperatively and a chest x-ray and CT scan abdomen pelvis negative for metastatic disease, S/P colostomy; s/p FOLFOX x 6 months. [Dr.Gittin ]  # LATER REVERSED , K RAS  wild type B RAF not evaluated   Also initial iron deficiency with anemia thrombocytosis, high platelets considered reactive, workup includes a normal PCR for CML and a negative [id number] mutation  DIAGNOSIS:  A. BREAST, RIGHT; LUMPECTOMY:  - INVASIVE MAMMARY CARCINOMA.  - DUCTAL CARCINOMA IN SITU (DCIS).  - SEE CANCER SUMMARY BELOW.  - BIOPSY SITE CHANGE WITH CLIP (2).  - FIBROUS TISSUE WITH CYST FORMATION AND FLORID DUCTAL HYPERPLASIA.  - SKIN AND NIPPLE NEGATIVE FOR MALIGNANCY.  - VASCULAR CALCIFICATION.   B. SENTINEL LYMPH NODES 1 AND 2, RIGHT AXILLA; EXCISION:  - METASTATIC CARCINOMA INVOLVES ONE OF TWO LYMPH NODES (1/2).   C. NON-SENTINEL LYMPH NODES, RIGHT AXILLA; EXCISION:  - ONE LYMPH NODE NEGATIVE FOR MALIGNANCY (0/1).   CANCER CASE SUMMARY: INVASIVE CARCINOMA OF THE BREAST  Standard(s): AJCC-UICC 8   SPECIMEN  Procedure: Lumpectomy  Specimen Laterality: Right   TUMOR  Histologic Type: Invasive carcinoma of no special type (ductal)  Histologic Grade ([place] Histologic Score)       Glandular (Acinar)/Tubular Differentiation: 3       Nuclear Pleomorphism: 3       Mitotic Rate: 3       Overall Grade: 3  Tumor  Size: 24 mm  Tumor Focality: Single focus of invasive carcinoma  Ductal Carcinoma In Situ (DCIS): Present, nuclear grade 2-3  Tumor Extent: Not applicable  Lymphatic and/or Vascular Invasion: Present  Treatment Effect in the Breast: No known presurgical therapy   MARGINS  Margin Status for Invasive Carcinoma: All margins negative for invasive  carcinoma       Distance from closest margin: 10 mm       Specify closest margin: Superior   Margin Status for DCIS: All margins negative for DCIS       Distance from DCIS to closest margin: 10 mm       Specify closest margin: Superior   REGIONAL LYMPH NODES  Regional Lymph Node Status: Tumor present in regional lymph node(s)       Number of Lymph Nodes with Macrometastases (greater than 2 mm): 1       Number of Lymph Nodes with Micrometastases (greater than 0.2 mm to  2 mm and/or greater than 200 cells): 0       Number of Lymph Nodes with Isolated Tumor Cells (0.2 mm or less OR  200 cells or less): 0       Size of Largest Metastatic Deposit: 3 mm      Extranodal Extension: Not identified       Total Number of Lymph Nodes Examined (sentinel and non-sentinel): 3       Number of Sentinel Nodes Examined: 2   DISTANT METASTASIS  Distant Site(s) Involved, if applicable: Not applicable  PATHOLOGIC STAGE CLASSIFICATION (pTNM, AJCC 8th Edition):  Modified Classification: Not applicable  pT Category: pT2  T Suffix: Not applicable  pN Category: pN1a  N Suffix: (sn)  pM Category: Not applicable   SPECIAL STUDIES  Breast Biomarker Testing Performed on Previous Outside Biopsy:  [id number]  Per outside report:  Estrogen Receptor (ER) Status: POSITIVE          Percentage of cells with nuclear positivity: Greater than 90%          Average intensity of staining: Strong   Progesterone Receptor (PgR) Status: POSITIVE          Percentage of cells with nuclear positivity: 60%          Average intensity of staining: Strong   HER2 (by  immunohistochemistry): EQUIVOCAL (Score 2+)  HER2 FISH: NEGATIVE   Ki-67: Not performed   IMPRESSION: 1. Complex cystic and solid-appearing mass within the subareolar RIGHT breast, overall measuring 3.1 cm greatest dimension, the solid enhancing component at the anterior-lateral aspect of the mass measuring 2 x 1 cm, abutting the overlying skin but without evidence of involvement/enhancement of the skin. Susceptibility artifact within the mass may represent a biopsy clip (if biopsy has been performed at an outside site since the diagnostic mammogram/ultrasound of 12/28/2021) or this artifact could represent calcifications within the mass. This mass corresponds to the recent ultrasound finding of a complex irregular mass at the 11 o'clock axis of the retroareolar RIGHT breast. 2. No evidence of multifocal or multicentric disease within the RIGHT breast. 3. No evidence of contralateral disease in the LEFT breast.   RECOMMENDATION: If has not already been performed, recommend ultrasound-guided biopsy of the suspicious mass in the subareolar RIGHT breast, with preferential targeting of the anterior-lateral component of the mass which is shown to be the enhancing component on today's MRI.   BI-RADS CATEGORY  4: Suspicious.     Cancer of sigmoid ([place]) (Resolved)  Carcinoma of overlapping sites of right breast in female, estrogen receptor positive ([place])  02/14/2022 Initial Diagnosis   Carcinoma of overlapping sites of right breast in female, estrogen receptor positive ([place])   02/14/2022 Cancer Staging   Staging form: Breast, AJCC 8th Edition - Pathologic: Stage IIA (pT2, pN1, cM0, G3, ER+, PR+, HER2-) - Signed by Sabrina Sickle, MD on 02/14/2022 Histologic grading system: 3 grade system   02/23/2022 -  Chemotherapy   Patient is on Treatment Plan : BREAST TC q21d        HISTORY OF PRESENTING ILLNESS:   Alone. Ambulating independently.   Sabrina Wilson 81 y.o.  female  currently receiving taxotere-cytoxan chemotherapy with udenyca support who returns to clinic for re-evaluation of rash. Rash has now nearly resolved. No residual itching. Last day of steroids is today.   Review of Systems  Constitutional:  Negative for chills, diaphoresis, fever, malaise/fatigue and weight loss.  HENT:  Negative for congestion, ear discharge, ear pain, nosebleeds, sinus pain and sore throat.   Eyes:  Negative for pain and redness.  Respiratory:  Negative for cough, hemoptysis, sputum production, shortness of breath, wheezing and stridor.   Cardiovascular:  Negative for chest pain, palpitations and leg swelling.  Gastrointestinal:  Negative for abdominal pain, constipation, diarrhea, nausea and vomiting.  Musculoskeletal:  Negative for falls, joint pain and myalgias.  Skin:  Negative for itching and rash.  Neurological:  Negative for dizziness, loss of consciousness, weakness and headaches.  Psychiatric/Behavioral:  Negative for depression. The patient is not nervous/anxious and does not  have insomnia.   All other systems reviewed and are negative.    MEDICAL HISTORY:  Past Medical History:  Diagnosis Date   Actinic keratosis 02/12/2020   R lat calf (hypertrophic)   Arthritis    Cancer of sigmoid ([place]) 06/17/2016   Colon cancer ([place]) 2009   T3,N0; s/p resection  Dr. Hulda Humphrey and chemotherapy by Dr. Cynda Acres   Diastolic dysfunction    a. 08/2020 Echo: EF 60-65%, no rwma, Gr1 DD, nl RV size/fxn. Mild MR.   Hernia of flank    History of actinic keratoses 08/11/2020   Bx proven at left lateral ankle proximal, LN2 10/08/20   History of stress test    a. 09/2020 MV: EF >65%. No ischemia/infarct. Mild Ao Ca2+ and minimal Cor Ca2+.   Hypertension    Neuropathy    Polyp at cervical os    s/p resection Dr. Sabra Heck   Skin cancer 01/22/2020   surface of an atypical verrucous squamous proliferation    Squamous cell carcinoma of skin 07/16/2020   Left lateral ankle anterior, [facility]  [facility] 08-11-2020    SURGICAL HISTORY: Past Surgical History:  Procedure Laterality Date   BREAST BIOPSY Right 05/2014   Dr. Dwyane Luo office-benign   BREAST LUMPECTOMY WITH SENTINEL LYMPH NODE BIOPSY Right 01/21/2022   Procedure: BREAST LUMPECTOMY WITH SENTINEL LYMPH NODE BX;  Surgeon: Robert Bellow, MD;  Location: [facility] ORS;  Service: General;  Laterality: Right;   BREAST SURGERY Right February 2016   Vacuum assisted biopsy for recurrent cyst, fibrocystic changes without evidence of malignancy.   CATARACT EXTRACTION W/PHACO Left 01/13/2015   Procedure: CATARACT EXTRACTION PHACO AND INTRAOCULAR LENS PLACEMENT (IOC);  Surgeon: Birder Robson, MD;  Location: [facility] ORS;  Service: Ophthalmology;  Laterality: Left;  [place]  1:02.9 AP   19.2 CDE  12.06 casette lot #  [id number]   CATARACT EXTRACTION W/PHACO Right 02/03/2015   Procedure: CATARACT EXTRACTION PHACO AND INTRAOCULAR LENS PLACEMENT (IOC);  Surgeon: Birder Robson, MD;  Location: [facility] ORS;  Service: Ophthalmology;  Laterality: Right;  us00:53 ap46.5 [id number]   COLECTOMY     COLONOSCOPY  2015   Dr Candace Cruise   COLONOSCOPY WITH PROPOFOL N/A 09/13/2017   Procedure: COLONOSCOPY WITH PROPOFOL;  Surgeon: Robert Bellow, MD;  Location[facility] ENDOSCOPY;  Service: Endoscopy;  Laterality: N/A;   COLONOSCOPY WITH PROPOFOL N/A 11/08/2017   Procedure: COLONOSCOPY WITH PROPOFOL;  Surgeon: Robert Bellow, MD;  Location: [facility] ENDOSCOPY;  Service: Endoscopy;  Laterality: N/A;   colostomy reversal     DILATION AND CURETTAGE OF UTERUS  2014   Dr. Sabra Heck, benign per pt   EYE SURGERY     HERNIA REPAIR  07/13/12   ventral    SKIN CANCER EXCISION     TONSILLECTOMY      SOCIAL HISTORY: Social History   Socioeconomic History   Marital status: Married    Spouse name: Not on file   Number of children: Not on file   Years of education: Not on file   Highest education level: Not on file  Occupational History   Not on file  Tobacco Use   Smoking  status: Former    Types: Cigarettes    Quit date: 10/12/2007    Years since quitting: 14.4   Smokeless tobacco: Never  Vaping Use   Vaping Use: Never used  Substance and Sexual Activity   Alcohol use: Yes    Alcohol/week: 1.0 standard drink of alcohol    Types: 1 Standard drinks or  equivalent per week    Comment: with dinner GLASS OF WINE EACH DAY   Drug use: No   Sexual activity: Not on file  Other Topics Concern   Not on file  Social History Narrative   Lives in [place] with husband. 2 children, son lives nearby.Diet - regularExercise - none. 30 mins/in [place]. Quit smoking in 2009. Wine before dinner. Pianist in church; real estate; Adult nurse.    Social Determinants of Health   Financial Resource Strain: Low Risk  (11/13/2018)   Overall Financial Resource Strain (CARDIA)    Difficulty of Paying Living Expenses: Not hard at all  Food Insecurity: No Food Insecurity (11/16/2020)   Hunger Vital Sign    Worried About Running Out of Food in the Last Year: Never true    Ran Out of Food in the Last Year: Never true  Transportation Needs: No Transportation Needs (11/16/2020)   PRAPARE - Hydrologist (Medical): No    Lack of Transportation (Non-Medical): No  Physical Activity: Not on file  Stress: No Stress Concern Present (11/16/2020)   [place]    Feeling of Stress : Not at all  Social Connections: Not on file  Intimate Partner Violence: Not At Risk (11/16/2020)   Humiliation, Afraid, Rape, and Kick questionnaire    Fear of Current or Ex-Partner: No    Emotionally Abused: No    Physically Abused: No    Sexually Abused: No    FAMILY HISTORY: Family History  Problem Relation Age of Onset   Stroke Mother    Diabetes Mother    Cancer Father        colon   Stroke Sister    Cancer Brother        colon   Cancer Brother        brain    ALLERGIES:  is allergic to sulfa  antibiotics.  MEDICATIONS:  Current Outpatient Medications  Medication Sig Dispense Refill   atorvastatin (LIPITOR) 10 MG tablet TAKE ONE TABLET BY MOUTH DAILY 90 tablet 1   Calcium Carbonate-Vit D-Min (CALCIUM 1200 PO) Take by mouth daily.     clobetasol ointment (TEMOVATE) 8.18 % Apply 1 application topically 2 (two) times daily. To affected areas of rash for up to 2-3 weeks. Avoid applying to face, groin, and axilla. Use as directed. Long-term use can cause thinning of the skin. 45 g 1   hydrochlorothiazide (HYDRODIURIL) 12.5 MG tablet TAKE ONE TABLET BY MOUTH DAILY 90 tablet 1   niacinamide 500 MG tablet Take 500 mg by mouth 2 (two) times daily.     Omega-3 Fatty Acids (FISH OIL PO) Take by mouth daily.     predniSONE (DELTASONE) 10 MG tablet Take 6 tablets (60 mg) x1 day, then take 5 tablets (50 mg) x1 day, then take 4 tablets (40 mg) x1 day, then take 3 tablets (30 mg) x1 day, then take 2 tablets (20 mg) x1 day, then take 1 tablet (10 mg) x1 day, then stop 21 tablet 0   Ruxolitinib Phosphate (OPZELURA) 1.5 % CREA Apply 1 application topically daily. 60 g 2   sodium fluoride (FLUORISHIELD) 1.1 % GEL dental gel Place onto teeth.     VITAMIN D, CHOLECALCIFEROL, PO Take 2,000 Units by mouth daily.     dexamethasone (DECADRON) 4 MG tablet Take 1 tablet (4 mg total) by mouth 2 (two) times daily with a meal. Start 1 days prior to chemo. Take  for 1 day ONLY.  DO NOT take on the day of chemo. (Patient not taking: Reported on 03/02/2022) 60 tablet 0   ondansetron (ZOFRAN) 8 MG tablet One pill every 8 hours as needed for nausea/vomitting. (Patient not taking: Reported on 03/02/2022) 40 tablet 1   prochlorperazine (COMPAZINE) 10 MG tablet Take 1 tablet (10 mg total) by mouth every 6 (six) hours as needed for nausea or vomiting. (Patient not taking: Reported on 03/02/2022) 40 tablet 1   No current facility-administered medications for this visit.   PHYSICAL EXAMINATION:  Vitals:   03/07/22 1503  BP:  (!) 156/82  Pulse: 83  Temp: (!) 97 F (36.1 C)   Filed Weights   03/07/22 1503  Weight: 189 lb (85.7 kg)    Physical Exam Constitutional:      Appearance: She is not ill-appearing.  Pulmonary:     Effort: No respiratory distress.     Breath sounds: No wheezing.  Skin:    General: Skin is dry.     Findings: No bruising, lesion or rash.  Neurological:     Mental Status: She is alert and oriented to person, place, and time.  Psychiatric:        Mood and Affect: Mood normal.        Behavior: Behavior normal.    LABORATORY DATA:  No labs today.   RADIOGRAPHIC STUDIES: No results found.  ASSESSMENT & PLAN:   Carcinoma of overlapping sites of right breast in female, estrogen receptor positive ([place]) # Right breast cancer -[facility]; ER;PR positive G-3; LVI + T2N1.  Stage II [OCT 2023; Dr.Byrnett] s/p Lumpectomy. Currently, s/p C1 taxotere-cytoxan chemotherapy, complicated by rash (see below). Prior to next chemotherapy, consider adjusting pre-meds and post treatment medications to minimize symptoms.   # Rash- thought to be secondary to chemotherapy though other etiologies also possible. Now s/p pepcid, benadryl, and prednisone. Resolved. She is established with Dr. Laurence Ferrari, dermatology, and we could have her evaluated if rash recurs.   # Prediabetes- monitor closely on steroids/chemotherapy.    DISPOSITION: Follow up with Dr. Rogue Bussing as scheduled.   All questions were answered. The patient/family knows to call the clinic with any problems, questions or concerns.   Verlon Au, NP 03/07/2022

## 2022-03-15 ENCOUNTER — Other Ambulatory Visit: Payer: Medicare Other

## 2022-03-15 ENCOUNTER — Ambulatory Visit: Payer: Medicare Other | Admitting: Internal Medicine

## 2022-03-15 ENCOUNTER — Ambulatory Visit: Payer: Medicare Other

## 2022-03-16 MED FILL — Dexamethasone Sodium Phosphate Inj 100 MG/10ML: INTRAMUSCULAR | Qty: 1 | Status: AC

## 2022-03-21 ENCOUNTER — Inpatient Hospital Stay (HOSPITAL_BASED_OUTPATIENT_CLINIC_OR_DEPARTMENT_OTHER): Payer: Medicare Other | Admitting: Internal Medicine

## 2022-03-21 ENCOUNTER — Inpatient Hospital Stay: Payer: Medicare Other

## 2022-03-21 ENCOUNTER — Encounter: Payer: Self-pay | Admitting: Internal Medicine

## 2022-03-21 VITALS — BP 156/86 | HR 77 | Temp 97.9°F | Resp 16 | Ht 66.5 in | Wt 189.0 lb

## 2022-03-21 DIAGNOSIS — Z170 Estrogen receptor positive status [ER+]: Secondary | ICD-10-CM

## 2022-03-21 DIAGNOSIS — C50811 Malignant neoplasm of overlapping sites of right female breast: Secondary | ICD-10-CM | POA: Diagnosis not present

## 2022-03-21 DIAGNOSIS — Z85038 Personal history of other malignant neoplasm of large intestine: Secondary | ICD-10-CM | POA: Diagnosis not present

## 2022-03-21 DIAGNOSIS — R21 Rash and other nonspecific skin eruption: Secondary | ICD-10-CM | POA: Diagnosis not present

## 2022-03-21 DIAGNOSIS — R7303 Prediabetes: Secondary | ICD-10-CM | POA: Diagnosis not present

## 2022-03-21 DIAGNOSIS — Z5111 Encounter for antineoplastic chemotherapy: Secondary | ICD-10-CM | POA: Diagnosis not present

## 2022-03-21 DIAGNOSIS — Z79899 Other long term (current) drug therapy: Secondary | ICD-10-CM | POA: Diagnosis not present

## 2022-03-21 LAB — CBC WITH DIFFERENTIAL/PLATELET
Abs Immature Granulocytes: 0.03 10*3/uL (ref 0.00–0.07)
Basophils Absolute: 0 10*3/uL (ref 0.0–0.1)
Basophils Relative: 0 %
Eosinophils Absolute: 0 10*3/uL (ref 0.0–0.5)
Eosinophils Relative: 0 %
HCT: 34.4 % — ABNORMAL LOW (ref 36.0–46.0)
Hemoglobin: 11.6 g/dL — ABNORMAL LOW (ref 12.0–15.0)
Immature Granulocytes: 0 %
Lymphocytes Relative: 19 %
Lymphs Abs: 1.4 10*3/uL (ref 0.7–4.0)
MCH: 33 pg (ref 26.0–34.0)
MCHC: 33.7 g/dL (ref 30.0–36.0)
MCV: 97.7 fL (ref 80.0–100.0)
Monocytes Absolute: 0.6 10*3/uL (ref 0.1–1.0)
Monocytes Relative: 8 %
Neutro Abs: 5.2 10*3/uL (ref 1.7–7.7)
Neutrophils Relative %: 73 %
Platelets: 262 10*3/uL (ref 150–400)
RBC: 3.52 MIL/uL — ABNORMAL LOW (ref 3.87–5.11)
RDW: 14 % (ref 11.5–15.5)
WBC: 7.3 10*3/uL (ref 4.0–10.5)
nRBC: 0 % (ref 0.0–0.2)

## 2022-03-21 LAB — COMPREHENSIVE METABOLIC PANEL
ALT: 20 U/L (ref 0–44)
AST: 26 U/L (ref 15–41)
Albumin: 3.8 g/dL (ref 3.5–5.0)
Alkaline Phosphatase: 53 U/L (ref 38–126)
Anion gap: 6 (ref 5–15)
BUN: 21 mg/dL (ref 8–23)
CO2: 27 mmol/L (ref 22–32)
Calcium: 9.8 mg/dL (ref 8.9–10.3)
Chloride: 101 mmol/L (ref 98–111)
Creatinine, Ser: 0.8 mg/dL (ref 0.44–1.00)
GFR, Estimated: >60 mL/min (ref >60)
Glucose, Bld: 133 mg/dL — ABNORMAL HIGH (ref 70–99)
Potassium: 3.7 mmol/L (ref 3.5–5.1)
Sodium: 134 mmol/L — ABNORMAL LOW (ref 135–145)
Total Bilirubin: 0.5 mg/dL (ref 0.3–1.2)
Total Protein: 7 g/dL (ref 6.5–8.1)

## 2022-03-21 MED ORDER — SODIUM CHLORIDE 0.9 % IV SOLN
75.0000 mg/m2 | Freq: Once | INTRAVENOUS | Status: AC
  Administered 2022-03-21: 150 mg via INTRAVENOUS
  Filled 2022-03-21: qty 15

## 2022-03-21 MED ORDER — SODIUM CHLORIDE 0.9 % IV SOLN
600.0000 mg/m2 | Freq: Once | INTRAVENOUS | Status: AC
  Administered 2022-03-21: 1200 mg via INTRAVENOUS
  Filled 2022-03-21: qty 60

## 2022-03-21 MED ORDER — PALONOSETRON HCL INJECTION 0.25 MG/5ML
0.2500 mg | Freq: Once | INTRAVENOUS | Status: AC
  Administered 2022-03-21: 0.25 mg via INTRAVENOUS
  Filled 2022-03-21: qty 5

## 2022-03-21 MED ORDER — METHYLPREDNISOLONE 4 MG PO TBPK: ORAL_TABLET | ORAL | 1 refills | Status: DC

## 2022-03-21 MED ORDER — SODIUM CHLORIDE 0.9 % IV SOLN
10.0000 mg | Freq: Once | INTRAVENOUS | Status: AC
  Administered 2022-03-21: 10 mg via INTRAVENOUS
  Filled 2022-03-21: qty 1

## 2022-03-21 MED ORDER — SODIUM CHLORIDE 0.9 % IV SOLN
Freq: Once | INTRAVENOUS | Status: AC
  Filled 2022-03-21: qty 250

## 2022-03-21 NOTE — Progress Notes (Signed)
[facility] [place] NOTE  Patient Care Team: Leone Haven, MD as PCP - General (Family Medicine) Kate Sable, MD as PCP - Cardiology (Cardiology) Bary Castilla Forest Gleason, MD as Consulting Physician (General Surgery) Dallas Schimke, MD (Internal Medicine) Daiva Huge, RN as Oncology Nurse Navigator  CHIEF COMPLAINTS/PURPOSE OF CONSULTATION: Breast cancer  #  Oncology History Overview Note  # 2009- Sigmoid colon cancer stage II (T3 N0 4 lymph nodes sampled M0 stage II high risk there was obstruction and perforation abscess; Dr.Hern), workup included a low CEA preoperatively and a chest x-ray and CT scan abdomen pelvis negative for metastatic disease, S/P colostomy; s/p FOLFOX x 6 months. [Dr.Gittin ]  # LATER REVERSED , K RAS  wild type B RAF not evaluated   Also initial iron deficiency with anemia thrombocytosis, high platelets considered reactive, workup includes a normal PCR for CML and a negative [id number] mutation  DIAGNOSIS:  A. BREAST, RIGHT; LUMPECTOMY:  - INVASIVE MAMMARY CARCINOMA.  - DUCTAL CARCINOMA IN SITU (DCIS).  - SEE CANCER SUMMARY BELOW.  - BIOPSY SITE CHANGE WITH CLIP (2).  - FIBROUS TISSUE WITH CYST FORMATION AND FLORID DUCTAL HYPERPLASIA.  - SKIN AND NIPPLE NEGATIVE FOR MALIGNANCY.  - VASCULAR CALCIFICATION.   B. SENTINEL LYMPH NODES 1 AND 2, RIGHT AXILLA; EXCISION:  - METASTATIC CARCINOMA INVOLVES ONE OF TWO LYMPH NODES (1/2).   C. NON-SENTINEL LYMPH NODES, RIGHT AXILLA; EXCISION:  - ONE LYMPH NODE NEGATIVE FOR MALIGNANCY (0/1).   CANCER CASE SUMMARY: INVASIVE CARCINOMA OF THE BREAST  Standard(s): AJCC-UICC 8   SPECIMEN  Procedure: Lumpectomy  Specimen Laterality: Right   TUMOR  Histologic Type: Invasive carcinoma of no special type (ductal)  Histologic Grade (Nottingham Histologic Score)       Glandular (Acinar)/Tubular Differentiation: 3       Nuclear Pleomorphism: 3       Mitotic Rate: 3       Overall Grade: 3  Tumor Size:  24 mm  Tumor Focality: Single focus of invasive carcinoma  Ductal Carcinoma In Situ (DCIS): Present, nuclear grade 2-3  Tumor Extent: Not applicable  Lymphatic and/or Vascular Invasion: Present  Treatment Effect in the Breast: No known presurgical therapy   MARGINS  Margin Status for Invasive Carcinoma: All margins negative for invasive  carcinoma       Distance from closest margin: 10 mm       Specify closest margin: Superior   Margin Status for DCIS: All margins negative for DCIS       Distance from DCIS to closest margin: 10 mm       Specify closest margin: Superior   REGIONAL LYMPH NODES  Regional Lymph Node Status: Tumor present in regional lymph node(s)       Number of Lymph Nodes with Macrometastases (greater than 2 mm): 1       Number of Lymph Nodes with Micrometastases (greater than 0.2 mm to  2 mm and/or greater than 200 cells): 0       Number of Lymph Nodes with Isolated Tumor Cells (0.2 mm or less OR  200 cells or less): 0       Size of Largest Metastatic Deposit: 3 mm      Extranodal Extension: Not identified       Total Number of Lymph Nodes Examined (sentinel and non-sentinel): 3       Number of Sentinel Nodes Examined: 2   DISTANT METASTASIS  Distant Site(s) Involved, if applicable: Not applicable  PATHOLOGIC STAGE CLASSIFICATION (pTNM, AJCC 8th Edition):  Modified Classification: Not applicable  pT Category: pT2  T Suffix: Not applicable  pN Category: pN1a  N Suffix: (sn)  pM Category: Not applicable   SPECIAL STUDIES  Breast Biomarker Testing Performed on Previous Outside Biopsy:  23-250-T11  Per outside report:  Estrogen Receptor (ER) Status: POSITIVE          Percentage of cells with nuclear positivity: Greater than 90%          Average intensity of staining: Strong   Progesterone Receptor (PgR) Status: POSITIVE          Percentage of cells with nuclear positivity: 60%          Average intensity of staining: Strong   HER2 (by  immunohistochemistry): EQUIVOCAL (Score 2+)  HER2 FISH: NEGATIVE   Ki-67: Not performed   IMPRESSION: 1. Complex cystic and solid-appearing mass within the subareolar RIGHT breast, overall measuring 3.1 cm greatest dimension, the solid enhancing component at the anterior-lateral aspect of the mass measuring 2 x 1 cm, abutting the overlying skin but without evidence of involvement/enhancement of the skin. Susceptibility artifact within the mass may represent a biopsy clip (if biopsy has been performed at an outside site since the diagnostic mammogram/ultrasound of 12/28/2021) or this artifact could represent calcifications within the mass. This mass corresponds to the recent ultrasound finding of a complex irregular mass at the 11 o'clock axis of the retroareolar RIGHT breast. 2. No evidence of multifocal or multicentric disease within the RIGHT breast. 3. No evidence of contralateral disease in the LEFT breast.   RECOMMENDATION: If has not already been performed, recommend ultrasound-guided biopsy of the suspicious mass in the subareolar RIGHT breast, with preferential targeting of the anterior-lateral component of the mass which is shown to be the enhancing component on today's MRI.   BI-RADS CATEGORY  4: Suspicious.     Cancer of sigmoid ([place]) (Resolved)  Carcinoma of overlapping sites of right breast in female, estrogen receptor positive ([place])  02/14/2022 Initial Diagnosis   Carcinoma of overlapping sites of right breast in female, estrogen receptor positive ([place])   02/14/2022 Cancer Staging   Staging form: Breast, AJCC 8th Edition - Pathologic: Stage IIA (pT2, pN1, cM0, G3, ER+, PR+, HER2-) - Signed by Cammie Sickle, MD on 02/14/2022 Histologic grading system: 3 grade system   02/23/2022 -  Chemotherapy   Patient is on Treatment Plan : BREAST TC q21d        HISTORY OF PRESENTING ILLNESS:   with DIL. Ambulating independently.   Sabrina Wilson 81 y.o.   female patient with triple negative breast cancer stage II -currently on adjuvant chemotherapy with Taxotere + Cytoxan is here for follow-up.  Patient's cycle #1 chemotherapy was complicated by skin rash.  Treated with antihistamine and steroids.  Resolved.  Patient also had myalgias/bone pain posttreatment improved with Claritin currently resolved.  Patient has mild residual neuropathy from her previous chemotherapy from oxaliplatin.  However, no falls.   Review of Systems  Constitutional:  Negative for chills, diaphoresis, fever, malaise/fatigue and weight loss.  HENT:  Negative for nosebleeds and sore throat.   Eyes:  Negative for double vision.  Respiratory:  Negative for cough, hemoptysis, sputum production, shortness of breath and wheezing.   Cardiovascular:  Negative for chest pain, palpitations, orthopnea and leg swelling.  Gastrointestinal:  Negative for abdominal pain, blood in stool, constipation, diarrhea, heartburn, melena, nausea and vomiting.  Genitourinary:  Negative for dysuria, frequency and urgency.  Musculoskeletal:  Negative for back pain and joint pain.  Skin: Negative.  Negative for itching and rash.  Neurological:  Negative for dizziness, tingling, focal weakness, weakness and headaches.  Endo/Heme/Allergies:  Does not bruise/bleed easily.  Psychiatric/Behavioral:  Negative for depression. The patient is not nervous/anxious and does not have insomnia.      MEDICAL HISTORY:  Past Medical History:  Diagnosis Date   Actinic keratosis 02/12/2020   R lat calf (hypertrophic)   Arthritis    Cancer of sigmoid ([place]) 06/17/2016   Colon cancer ([place]) 2009   T3,N0; s/p resection  Dr. Hulda Humphrey and chemotherapy by Dr. Cynda Acres   Diastolic dysfunction    a. 08/2020 Echo: EF 60-65%, no rwma, Gr1 DD, nl RV size/fxn. Mild MR.   Hernia of flank    History of actinic keratoses 08/11/2020   Bx proven at left lateral ankle proximal, LN2 10/08/20   History of stress test    a. 09/2020  MV: EF >65%. No ischemia/infarct. Mild Ao Ca2+ and minimal Cor Ca2+.   Hypertension    Neuropathy    Polyp at cervical os    s/p resection Dr. Sabra Heck   Skin cancer 01/22/2020   surface of an atypical verrucous squamous proliferation    Squamous cell carcinoma of skin 07/16/2020   Left lateral ankle anterior, treated with [facility] 08-11-2020    SURGICAL HISTORY: Past Surgical History:  Procedure Laterality Date   BREAST BIOPSY Right 05/2014   Dr. Dwyane Luo office-benign   BREAST LUMPECTOMY WITH SENTINEL LYMPH NODE BIOPSY Right 01/21/2022   Procedure: BREAST LUMPECTOMY WITH SENTINEL LYMPH NODE BX;  Surgeon: Robert Bellow, MD;  Location: [facility] ORS;  Service: General;  Laterality: Right;   BREAST SURGERY Right February 2016   Vacuum assisted biopsy for recurrent cyst, fibrocystic changes without evidence of malignancy.   CATARACT EXTRACTION W/PHACO Left 01/13/2015   Procedure: CATARACT EXTRACTION PHACO AND INTRAOCULAR LENS PLACEMENT (IOC);  Surgeon: Birder Robson, MD;  Location: [facility] ORS;  Service: Ophthalmology;  Laterality: Left;  [place]  1:02.9 AP   19.2 CDE  12.06 casette lot #  [id number]   CATARACT EXTRACTION W/PHACO Right 02/03/2015   Procedure: CATARACT EXTRACTION PHACO AND INTRAOCULAR LENS PLACEMENT (IOC);  Surgeon: Birder Robson, MD;  Location: [facility] ORS;  Service: Ophthalmology;  Laterality: Right;  us00:53 ap46.5 [id number]   COLECTOMY     COLONOSCOPY  2015   Dr Candace Cruise   COLONOSCOPY WITH PROPOFOL N/A 09/13/2017   Procedure: COLONOSCOPY WITH PROPOFOL;  Surgeon: Robert Bellow, MD;  Location[facility] ENDOSCOPY;  Service: Endoscopy;  Laterality: N/A;   COLONOSCOPY WITH PROPOFOL N/A 11/08/2017   Procedure: COLONOSCOPY WITH PROPOFOL;  Surgeon: Robert Bellow, MD;  Location: [facility] ENDOSCOPY;  Service: Endoscopy;  Laterality: N/A;   colostomy reversal     DILATION AND CURETTAGE OF UTERUS  2014   Dr. Sabra Heck, benign per pt   EYE SURGERY     HERNIA REPAIR  07/13/12   ventral    SKIN  CANCER EXCISION     TONSILLECTOMY      SOCIAL HISTORY: Social History   Socioeconomic History   Marital status: Married    Spouse name: Not on file   Number of children: Not on file   Years of education: Not on file   Highest education level: Not on file  Occupational History   Not on file  Tobacco Use   Smoking status: Former    Types: Cigarettes    Quit date: 10/12/2007    Years  since quitting: 14.4   Smokeless tobacco: Never  Vaping Use   Vaping Use: Never used  Substance and Sexual Activity   Alcohol use: Yes    Alcohol/week: 1.0 standard drink of alcohol    Types: 1 Standard drinks or equivalent per week    Comment: with dinner GLASS OF WINE EACH DAY   Drug use: No   Sexual activity: Not on file  Other Topics Concern   Not on file  Social History Narrative   Lives in [place] with husband. 2 children, son lives nearby.Diet - regularExercise - none. 30 mins/in [place]. Quit smoking in 2009. Wine before dinner. Pianist in church; real estate; Adult nurse.    Social Determinants of Health   Financial Resource Strain: Low Risk  (11/13/2018)   Overall Financial Resource Strain (CARDIA)    Difficulty of Paying Living Expenses: Not hard at all  Food Insecurity: No Food Insecurity (11/16/2020)   Hunger Vital Sign    Worried About Running Out of Food in the Last Year: Never true    Ran Out of Food in the Last Year: Never true  Transportation Needs: No Transportation Needs (11/16/2020)   PRAPARE - Hydrologist (Medical): No    Lack of Transportation (Non-Medical): No  Physical Activity: Not on file  Stress: No Stress Concern Present (11/16/2020)   [place]    Feeling of Stress : Not at all  Social Connections: Not on file  Intimate Partner Violence: Not At Risk (11/16/2020)   Humiliation, Afraid, Rape, and Kick questionnaire    Fear of Current or Ex-Partner: No     Emotionally Abused: No    Physically Abused: No    Sexually Abused: No    FAMILY HISTORY: Family History  Problem Relation Age of Onset   Stroke Mother    Diabetes Mother    Cancer Father        colon   Stroke Sister    Cancer Brother        colon   Cancer Brother        brain    ALLERGIES:  is allergic to sulfa antibiotics.  MEDICATIONS:  Current Outpatient Medications  Medication Sig Dispense Refill   atorvastatin (LIPITOR) 10 MG tablet TAKE ONE TABLET BY MOUTH DAILY 90 tablet 1   Calcium Carbonate-Vit D-Min (CALCIUM 1200 PO) Take by mouth daily.     clobetasol ointment (TEMOVATE) 3.54 % Apply 1 application topically 2 (two) times daily. To affected areas of rash for up to 2-3 weeks. Avoid applying to face, groin, and axilla. Use as directed. Long-term use can cause thinning of the skin. 45 g 1   hydrochlorothiazide (HYDRODIURIL) 12.5 MG tablet TAKE ONE TABLET BY MOUTH DAILY 90 tablet 1   methylPREDNISolone (MEDROL DOSEPAK) 4 MG TBPK tablet Use as directed. 21 tablet 1   niacinamide 500 MG tablet Take 500 mg by mouth 2 (two) times daily.     Omega-3 Fatty Acids (FISH OIL PO) Take by mouth daily.     Ruxolitinib Phosphate (OPZELURA) 1.5 % CREA Apply 1 application topically daily. 60 g 2   sodium fluoride (FLUORISHIELD) 1.1 % GEL dental gel Place onto teeth.     VITAMIN D, CHOLECALCIFEROL, PO Take 2,000 Units by mouth daily.     dexamethasone (DECADRON) 4 MG tablet Take 1 tablet (4 mg total) by mouth 2 (two) times daily with a meal. Start 1 days prior to chemo. Take  for 1 day ONLY.  DO NOT take on the day of chemo. (Patient not taking: Reported on 03/02/2022) 60 tablet 0   ondansetron (ZOFRAN) 8 MG tablet One pill every 8 hours as needed for nausea/vomitting. (Patient not taking: Reported on 03/02/2022) 40 tablet 1   predniSONE (DELTASONE) 10 MG tablet Take 6 tablets (60 mg) x1 day, then take 5 tablets (50 mg) x1 day, then take 4 tablets (40 mg) x1 day, then take 3 tablets (30 mg)  x1 day, then take 2 tablets (20 mg) x1 day, then take 1 tablet (10 mg) x1 day, then stop (Patient not taking: Reported on 03/21/2022) 21 tablet 0   prochlorperazine (COMPAZINE) 10 MG tablet Take 1 tablet (10 mg total) by mouth every 6 (six) hours as needed for nausea or vomiting. (Patient not taking: Reported on 03/02/2022) 40 tablet 1   No current facility-administered medications for this visit.   Facility-Administered Medications Ordered in Other Visits  Medication Dose Route Frequency Provider Last Rate Last Admin   cyclophosphamide (CYTOXAN) 1,200 mg in sodium chloride 0.9 % 250 mL chemo infusion  600 mg/m2 (Treatment Plan Recorded) Intravenous Once Cammie Sickle, MD       DOCEtaxel (TAXOTERE) 150 mg in sodium chloride 0.9 % 250 mL chemo infusion  75 mg/m2 (Treatment Plan Recorded) Intravenous Once Cammie Sickle, MD          .  PHYSICAL EXAMINATION:   Vitals:   03/21/22 0900  BP: (!) 156/86  Pulse: 77  Resp: 16  Temp: 97.9 F (36.6 C)   Filed Weights   03/21/22 0900  Weight: 189 lb (85.7 kg)    Physical Exam Vitals and nursing note reviewed.  HENT:     Head: Normocephalic and atraumatic.     Mouth/Throat:     Pharynx: Oropharynx is clear.  Eyes:     Extraocular Movements: Extraocular movements intact.     Pupils: Pupils are equal, round, and reactive to light.  Cardiovascular:     Rate and Rhythm: Normal rate and regular rhythm.  Pulmonary:     Comments: Decreased breath sounds bilaterally.  Abdominal:     Palpations: Abdomen is soft.  Musculoskeletal:        General: Normal range of motion.     Cervical back: Normal range of motion.  Skin:    General: Skin is warm.  Neurological:     General: No focal deficit present.     Mental Status: She is alert and oriented to person, place, and time.  Psychiatric:        Behavior: Behavior normal.        Judgment: Judgment normal.      LABORATORY DATA:  I have reviewed the data as listed Lab  Results  Component Value Date   WBC 7.3 03/21/2022   HGB 11.6 (L) 03/21/2022   HCT 34.4 (L) 03/21/2022   MCV 97.7 03/21/2022   PLT 262 03/21/2022   Recent Labs    02/14/22 1546 02/23/22 0814 03/02/22 1249 03/21/22 0915  NA 134* 136 131* 134*  K 3.5 4.0 3.8 3.7  CL 96* 102 96* 101  CO2 _0 GLUCOSE 104* 145* 115* 133*  BUN _1 CREATININE 0.84 0.78 0.85 0.80  CALCIUM 10.0 10.1 10.0 9.8  GFRNONAA >60 >60 >60 >60  PROT 8.1 7.5  --  7.0  ALBUMIN 4.4 4.2  --  3.8  AST 29 25  --  26  ALT  20 20  --  20  ALKPHOS 65 59  --  53  BILITOT 0.7 0.4  --  0.5    RADIOGRAPHIC STUDIES: I have personally reviewed the radiological images as listed and agreed with the findings in the report. No results found.  ASSESSMENT & PLAN:   Carcinoma of overlapping sites of right breast in female, estrogen receptor positive ([place]) # Right breast cancer -[facility]; ER;PR positive G-3; LVI + T2N1.  Stage II [OCT 2023; Dr.Byrnett] s/p Lumpectomy; node positive-Oncotype RS- 35-which translates to approximately 30% risk of recurrence with endocrine therapy alone. Given high risk Oncotype-proceed with adjuvant chemotherapy with Taxotere-Cytoxan every 3 weeks for 4 cycles. Consider abema x2 years   # Proceed with Taxotere -Cytoxan cycle #2 today. Labs today reviewed;  acceptable for treatment today.    # Skin rash around neck- post chemo-s/p prednisone x 7 days-  likely secondary to Taxotere. Recommend claritin daily; and also recommend medrol dose pack prn. Sent a new script.   # Prediabetes-monitor closely on steroids/chemotherapy.; PBG- 133- monitor for now on steroids.    # Hx of colon cancer stage III- s/p FOLFOX [2009]-residual neuropathy monitor closely on chemotherapy.   # Genetic testing: brother-& father- colon cancer; other brother- brain tumor; no breast cancers in family. We will make a referral down the line. STABLE.   # IV Access: proceed with PIV today; if not proceed with  medi-port vs PICC line.   # DISPOSITION: # chemo today; undenyca- on 11/29.  # in 2 week- NP; labs- cbc/bmp- [ later in day appt] # follow up in 3 weeks- MD: labs- cbc/cmp; Taxoeter-Cytoxan; D-2- Margart Sickles- Dr.B    All questions were answered. The patient/family knows to call the clinic with any problems, questions or concerns.       Cammie Sickle, MD 03/21/2022 11:02 AM

## 2022-03-21 NOTE — Patient Instructions (Signed)
[facility]CER CTR AT [place]  Discharge Instructions: Thank you for choosing [facility] to provide your oncology and hematology care.  If you have a lab appointment with the [facility], please go directly to the [facility] and check in at the registration area.  Wear comfortable clothing and clothing appropriate for easy access to any Portacath or PICC line.   We strive to give you quality time with your provider. You may need to reschedule your appointment if you arrive late (15 or more minutes).  Arriving late affects you and other patients whose appointments are after yours.  Also, if you miss three or more appointments without notifying the office, you may be dismissed from the clinic at the provider's discretion.      For prescription refill requests, have your pharmacy contact our office and allow 72 hours for refills to be completed.    Today you received the following chemotherapy and/or immunotherapy agents Taxotere and Cytoxan        To help prevent nausea and vomiting after your treatment, we encourage you to take your nausea medication as directed.  BELOW ARE SYMPTOMS THAT SHOULD BE REPORTED IMMEDIATELY: *FEVER GREATER THAN 100.4 F (38 C) OR HIGHER *CHILLS OR SWEATING *NAUSEA AND VOMITING THAT IS NOT CONTROLLED WITH YOUR NAUSEA MEDICATION *UNUSUAL SHORTNESS OF BREATH *UNUSUAL BRUISING OR BLEEDING *URINARY PROBLEMS (pain or burning when urinating, or frequent urination) *BOWEL PROBLEMS (unusual diarrhea, constipation, pain near the anus) TENDERNESS IN MOUTH AND THROAT WITH OR WITHOUT PRESENCE OF ULCERS (sore throat, sores in mouth, or a toothache) UNUSUAL RASH, SWELLING OR PAIN  UNUSUAL VAGINAL DISCHARGE OR ITCHING   Items with * indicate a potential emergency and should be followed up as soon as possible or go to the Emergency Department if any problems should occur.  Please show the CHEMOTHERAPY ALERT CARD or IMMUNOTHERAPY ALERT CARD at  check-in to the Emergency Department and triage nurse.  Should you have questions after your visit or need to cancel or reschedule your appointment, please contact [facility] [place] AT [place]  [phone] and follow the prompts.  Office hours are 8:00 a.m. to 4:30 p.m. Monday - Friday. Please note that voicemails left after 4:00 p.m. may not be returned until the following business day.  We are closed weekends and major holidays. You have access to a nurse at all times for urgent questions. Please call the main number to the clinic [phone] and follow the prompts.  For any non-urgent questions, you may also contact your provider using MyChart. We now offer e-Visits for anyone 52 and older to request care online for non-urgent symptoms. For details visit [email or web address].   Also download the MyChart app! Go to the app store, search "MyChart", open the app, select [facility], and log in with your MyChart username and password.  Masks are optional in the cancer centers. If you would like for your care team to wear a mask while they are taking care of you, please let them know. For doctor visits, patients may have with them one support person who is at least 79 years old. At this time, visitors are not allowed in the infusion area.

## 2022-03-21 NOTE — Progress Notes (Signed)
Patient reports the rash has improved

## 2022-03-21 NOTE — Assessment & Plan Note (Addendum)
#   Right breast cancer -[facility]; ER;PR positive G-3; LVI + T2N1.  Stage II [OCT 2023; Dr.Byrnett] s/p Lumpectomy; node positive-Oncotype RS- 35-which translates to approximately 30% risk of recurrence with endocrine therapy alone. Given high risk Oncotype-proceed with adjuvant chemotherapy with Taxotere-Cytoxan every 3 weeks for 4 cycles. Consider abema x2 years   # Proceed with Taxotere -Cytoxan cycle #2 today. Labs today reviewed;  acceptable for treatment today.    # Skin rash around neck- post chemo-s/p prednisone x 7 days-  likely secondary to Taxotere. Recommend claritin daily; and also recommend medrol dose pack prn. Sent a new script.   # Prediabetes-monitor closely on steroids/chemotherapy.; PBG- 133- monitor for now on steroids.    # Hx of colon cancer stage III- s/p FOLFOX [2009]-residual neuropathy monitor closely on chemotherapy.   # Genetic testing: brother-& father- colon cancer; other brother- brain tumor; no breast cancers in family. We will make a referral down the line. STABLE.   # IV Access: proceed with PIV today; if not proceed with medi-port vs PICC line.   # DISPOSITION: # chemo today; undenyca- on 11/29.  # in 2 week- NP; labs- cbc/bmp- [ later in day appt] # follow up in 3 weeks- MD: labs- cbc/cmp; Taxoeter-Cytoxan; D-2- Margart Sickles- Dr.B

## 2022-03-23 ENCOUNTER — Inpatient Hospital Stay: Payer: Medicare Other

## 2022-03-23 ENCOUNTER — Other Ambulatory Visit: Payer: Self-pay | Admitting: Internal Medicine

## 2022-03-23 DIAGNOSIS — Z170 Estrogen receptor positive status [ER+]: Secondary | ICD-10-CM

## 2022-03-23 DIAGNOSIS — Z5111 Encounter for antineoplastic chemotherapy: Secondary | ICD-10-CM | POA: Diagnosis not present

## 2022-03-23 MED ORDER — PEGFILGRASTIM-CBQV 6 MG/0.6ML ~~LOC~~ SOSY
6.0000 mg | PREFILLED_SYRINGE | Freq: Once | SUBCUTANEOUS | Status: AC
  Administered 2022-03-23: 6 mg via SUBCUTANEOUS
  Filled 2022-03-23: qty 0.6

## 2022-03-29 ENCOUNTER — Ambulatory Visit: Payer: Medicare Other

## 2022-03-31 ENCOUNTER — Ambulatory Visit: Payer: Medicare Other | Admitting: Dermatology

## 2022-03-31 ENCOUNTER — Encounter: Payer: Self-pay | Admitting: Family Medicine

## 2022-03-31 ENCOUNTER — Encounter: Payer: Self-pay | Admitting: Dermatology

## 2022-03-31 DIAGNOSIS — D170 Benign lipomatous neoplasm of skin and subcutaneous tissue of head, face and neck: Secondary | ICD-10-CM | POA: Diagnosis not present

## 2022-03-31 DIAGNOSIS — L821 Other seborrheic keratosis: Secondary | ICD-10-CM

## 2022-03-31 DIAGNOSIS — D229 Melanocytic nevi, unspecified: Secondary | ICD-10-CM

## 2022-03-31 DIAGNOSIS — Z85828 Personal history of other malignant neoplasm of skin: Secondary | ICD-10-CM

## 2022-03-31 DIAGNOSIS — Z1283 Encounter for screening for malignant neoplasm of skin: Secondary | ICD-10-CM

## 2022-03-31 DIAGNOSIS — L309 Dermatitis, unspecified: Secondary | ICD-10-CM

## 2022-03-31 DIAGNOSIS — L570 Actinic keratosis: Secondary | ICD-10-CM | POA: Diagnosis not present

## 2022-03-31 DIAGNOSIS — L578 Other skin changes due to chronic exposure to nonionizing radiation: Secondary | ICD-10-CM

## 2022-03-31 DIAGNOSIS — B353 Tinea pedis: Secondary | ICD-10-CM

## 2022-03-31 DIAGNOSIS — L300 Nummular dermatitis: Secondary | ICD-10-CM

## 2022-03-31 DIAGNOSIS — L814 Other melanin hyperpigmentation: Secondary | ICD-10-CM

## 2022-03-31 MED ORDER — CLOBETASOL PROPIONATE 0.05 % EX CREA: TOPICAL_CREAM | CUTANEOUS | 1 refills | Status: DC

## 2022-03-31 NOTE — Patient Instructions (Addendum)
Feet: Start Terbinafine cream twice daily to feet until clear then one additional week. Use once weekly for maintenance.    Chemotherapy Rash: Start Clobetasol cream twice daily to affected areas of rash up to 2 weeks as needed. Avoid applying to face, groin, and axilla. Use as directed. Long-term use can cause thinning of the skin.  Topical steroids (such as triamcinolone, fluocinolone, fluocinonide, mometasone, clobetasol, halobetasol, betamethasone, hydrocortisone) can cause thinning and lightening of the skin if they are used for too long in the same area. Your physician has selected the right strength medicine for your problem and area affected on the body. Please use your medication only as directed by your physician to prevent side effects.    Nummular dermatitis: Use Opzelura as directed. If not controlled can use Clobetasol cream twice daily up to 2 weeks.    Recommend daily broad spectrum sunscreen SPF 30+ to sun-exposed areas, reapply every 2 hours as needed. Call for new or changing lesions.  Staying in the shade or wearing long sleeves, sun glasses (UVA+UVB protection) and wide brim hats (4-inch brim around the entire circumference of the hat) are also recommended for sun protection.    Melanoma ABCDEs  Melanoma is the most dangerous type of skin cancer, and is the leading cause of death from skin disease.  You are more likely to develop melanoma if you: Have light-colored skin, light-colored eyes, or red or blond hair Spend a lot of time in the sun Tan regularly, either outdoors or in a tanning bed Have had blistering sunburns, especially during childhood Have a close family member who has had a melanoma Have atypical moles or large birthmarks  Early detection of melanoma is key since treatment is typically straightforward and cure rates are extremely high if we catch it early.   The first sign of melanoma is often a change in a mole or a new dark spot.  The ABCDE system  is a way of remembering the signs of melanoma.  A for asymmetry:  The two halves do not match. B for border:  The edges of the growth are irregular. C for color:  A mixture of colors are present instead of an even brown color. D for diameter:  Melanomas are usually (but not always) greater than 25m - the size of a pencil eraser. E for evolution:  The spot keeps changing in size, shape, and color.  Please check your skin once per month between visits. You can use a small mirror in front and a large mirror behind you to keep an eye on the back side or your body.   If you see any new or changing lesions before your next follow-up, please call to schedule a visit.  Please continue daily skin protection including broad spectrum sunscreen SPF 30+ to sun-exposed areas, reapplying every 2 hours as needed when you're outdoors.   Staying in the shade or wearing long sleeves, sun glasses (UVA+UVB protection) and wide brim hats (4-inch brim around the entire circumference of the hat) are also recommended for sun protection.    Due to recent changes in healthcare laws, you may see results of your pathology and/or laboratory studies on MyChart before the doctors have had a chance to review them. We understand that in some cases there may be results that are confusing or concerning to you. Please understand that not all results are received at the same time and often the doctors may need to interpret multiple results in order to provide you  with the best plan of care or course of treatment. Therefore, we ask that you please give [place] 2 business days to thoroughly review all your results before contacting the office for clarification. Should we see a critical lab result, you will be contacted sooner.   If You Need Anything After Your Visit  If you have any questions or concerns for your doctor, please call our main line at [phone] and press option 4 to reach your doctor's medical assistant. If no one  answers, please leave a voicemail as directed and we will return your call as soon as possible. Messages left after 4 pm will be answered the following business day.   You may also send [place] a message via [place]. We typically respond to MyChart messages within 1-2 business days.  For prescription refills, please ask your pharmacy to contact our office. Our fax number is [phone].  If you have an urgent issue when the clinic is closed that cannot wait until the next business day, you can page your doctor at the number below.    Please note that while we do our best to be available for urgent issues outside of office hours, we are not available 24/7.   If you have an urgent issue and are unable to reach [place], you may choose to seek medical care at your doctor's office, retail clinic, urgent care center, or emergency room.  If you have a medical emergency, please immediately call 911 or go to the emergency department.  Pager Numbers  - Dr. Nehemiah Massed: [phone]  - Dr. Laurence Ferrari: [phone]  - Dr. Nicole Kindred: [phone]  In the event of inclement weather, please call our main line at [phone] for an update on the status of any delays or closures.  Dermatology Medication Tips: Please keep the boxes that topical medications come in in order to help keep track of the instructions about where and how to use these. Pharmacies typically print the medication instructions only on the boxes and not directly on the medication tubes.   If your medication is too expensive, please contact our office at [phone] option 4 or send [place] a message through [place].   We are unable to tell what your co-pay for medications will be in advance as this is different depending on your insurance coverage. However, we may be able to find a substitute medication at lower cost or fill out paperwork to get insurance to cover a needed medication.   If a prior authorization is required to get your medication covered  by your insurance company, please allow [place] 1-2 business days to complete this process.  Drug prices often vary depending on where the prescription is filled and some pharmacies may offer cheaper prices.  The website www.goodrx.com contains coupons for medications through different pharmacies. The prices here do not account for what the cost may be with help from insurance (it may be cheaper with your insurance), but the website can give you the price if you did not use any insurance.  - You can print the associated coupon and take it with your prescription to the pharmacy.  - You may also stop by our office during regular business hours and pick up a GoodRx coupon card.  - If you need your prescription sent electronically to a different pharmacy, notify our office through [facility] or by phone at [phone] option 4.     Si Usted Necesita Algo Despus de Su Visita  Tambin puede enviarnos un mensaje a Lawerance Cruel  de MyChart. Por lo general respondemos a los mensajes de MyChart en el transcurso de 1 a 2 das hbiles.  Para renovar recetas, por favor pida a su farmacia que se ponga en contacto con nuestra oficina. Harland Dingwall de fax es [place] [phone].  Si tiene un asunto urgente cuando la clnica est cerrada y que no puede esperar hasta el siguiente da hbil, puede llamar/localizar a su doctor(a) al nmero que aparece a continuacin.   Por favor, tenga en cuenta que aunque hacemos todo lo posible para estar disponibles para asuntos urgentes fuera del horario de [place], no estamos disponibles las 24 horas del da, los 7 das de la [place].   Si tiene un problema urgente y no puede comunicarse con nosotros, puede optar por buscar atencin mdica  en el consultorio de su doctor(a), en una clnica privada, en un centro de atencin urgente o en una sala de emergencias.  Si tiene Engineering geologist, por favor llame inmediatamente al 911 o vaya a la sala de emergencias.  Nmeros de  bper  - Dr. Nehemiah Massed: [phone]  - Dra. Moye: [phone]  - Dra. Nicole Kindred: [phone]  En caso de inclemencias del [place], por favor llame a Johnsie Kindred principal al [phone] para una actualizacin sobre el [place] de cualquier retraso o cierre.  Consejos para la medicacin en dermatologa: Por favor, guarde las cajas en las que vienen los medicamentos de uso tpico para ayudarle a seguir las instrucciones sobre dnde y cmo usarlos. Las farmacias generalmente imprimen las instrucciones del medicamento slo en las cajas y no directamente en los tubos del [place].   Si su medicamento es muy caro, por favor, pngase en contacto con Zigmund Daniel llamando al [phone] y presione la opcin 4 o envenos un mensaje a travs de Pharmacist, community.   No podemos decirle cul ser su copago por los medicamentos por adelantado ya que esto es diferente dependiendo de la cobertura de su seguro. Sin embargo, es posible que podamos encontrar un medicamento sustituto a Electrical engineer un formulario para que el seguro cubra el medicamento que se considera necesario.   Si se requiere una autorizacin previa para que su compaa de seguros [place] su medicamento, por favor permtanos de 1 a 2 das hbiles para completar este proceso.  Los precios de los medicamentos varan con frecuencia dependiendo del Environmental consultant de dnde se surte la receta y alguna farmacias pueden ofrecer precios ms baratos.  El sitio web www.goodrx.com tiene cupones para medicamentos de Airline pilot. Los precios aqu no tienen en cuenta lo que podra costar con la ayuda del seguro (puede ser ms barato con su seguro), pero el sitio web puede darle el precio si no utiliz Research scientist (physical sciences).  - Puede imprimir el cupn correspondiente y llevarlo con su receta a la farmacia.  - Tambin puede pasar por nuestra oficina durante el horario de atencin regular y Charity fundraiser una tarjeta de cupones de GoodRx.  - Si necesita que su receta se  enve electrnicamente a una farmacia diferente, informe a nuestra oficina a travs de MyChart de [place] o por telfono llamando al [phone] y presione la opcin 4.

## 2022-03-31 NOTE — Progress Notes (Signed)
Follow-Up Visit   Subjective  Sabrina Wilson is a 81 y.o. female who presents for the following: Annual Exam (Hx of SCC. Hx Aks. Dx with breast cancer since last visit. Has had 2 chemotherapy treatments and will have 2 more).  The patient presents for Total-Body Skin Exam (TBSE) for skin cancer screening and mole check.  The patient has spots, moles and lesions to be evaluated, some may be new or changing and the patient has concerns that these could be cancer.   The following portions of the chart were reviewed this encounter and updated as appropriate:  Tobacco  Allergies  Meds  Problems  Med Hx  Surg Hx  Fam Hx      Review of Systems: No other skin or systemic complaints except as noted in HPI or Assessment and Plan.   Objective  Well appearing patient in no apparent distress; mood and affect are within normal limits.  A full examination was performed including scalp, head, eyes, ears, nose, lips, neck, chest, axillae, abdomen, back, buttocks, bilateral upper extremities, bilateral lower extremities, hands, feet, fingers, toes, fingernails, and toenails. All findings within normal limits unless otherwise noted below.  right neck Subcutaneous rubbery nodule  Left Lower Leg - Anterior Erythematous thin papules/macules with gritty scale.      neck, body areas Clear today  Left Foot - Anterior Fissure between toes on left foot  Left Ankle - lateral Scaly erythematous papules and plaques   Assessment & Plan   History of Squamous Cell Carcinoma of the Skin. Left lateral ankle. [facility] 08/11/2020 - No evidence of recurrence today - No lymphadenopathy - Recommend regular full body skin exams - Recommend daily broad spectrum sunscreen SPF 30+ to sun-exposed areas, reapply every 2 hours as needed.  - Call if any new or changing lesions are noted between office visits  Lentigines - Scattered tan macules - Due to sun exposure - Benign-appearing, observe - Recommend  daily broad spectrum sunscreen SPF 30+ to sun-exposed areas, reapply every 2 hours as needed. - Call for any changes  Seborrheic Keratoses - Stuck-on, waxy, tan-brown papules and/or plaques  - Benign-appearing - Discussed benign etiology and prognosis. - Observe - Call for any changes  Melanocytic Nevi - Tan-brown and/or pink-flesh-colored symmetric macules and papules - Benign appearing on exam today - Observation - Call clinic for new or changing moles - Recommend daily use of broad spectrum spf 30+ sunscreen to sun-exposed areas.   Hemangiomas - Red papules - Discussed benign nature - Observe - Call for any changes  Actinic Damage - Chronic condition, secondary to cumulative UV/sun exposure - diffuse scaly erythematous macules with underlying dyspigmentation - Recommend daily broad spectrum sunscreen SPF 30+ to sun-exposed areas, reapply every 2 hours as needed.  - Staying in the shade or wearing long sleeves, sun glasses (UVA+UVB protection) and wide brim hats (4-inch brim around the entire circumference of the hat) are also recommended for sun protection.  - Call for new or changing lesions.  Skin cancer screening performed today.  Lipoma of neck right neck  Benign-appearing.  Observation.  Call clinic for new or changing lesions.    AK (actinic keratosis) Left Lower Leg - Anterior  Actinic keratoses are precancerous spots that appear secondary to cumulative UV radiation exposure/sun exposure over time. They are chronic with expected duration over 1 year. A portion of actinic keratoses will progress to squamous cell carcinoma of the skin. It is not possible to reliably predict which spots will  progress to skin cancer and so treatment is recommended to prevent development of skin cancer.  Recommend daily broad spectrum sunscreen SPF 30+ to sun-exposed areas, reapply every 2 hours as needed.  Recommend staying in the shade or wearing long sleeves, sun glasses (UVA+UVB  protection) and wide brim hats (4-inch brim around the entire circumference of the hat). Call for new or changing lesions.  Will watch for now - defer treatment due to risk of wound on the legs and infection risk. RTC when finished with chemotherapy for re-evaluation and possible treatment. Monitor for changes.   Dermatitis neck, body areas  Secondary to chemotherapy treatment  Start Clobetasol cream twice daily to affected areas of rash up to 2 weeks as needed. Avoid applying to face, groin, and axilla. Use as directed. Long-term use can cause thinning of the skin.  Topical steroids (such as triamcinolone, fluocinolone, fluocinonide, mometasone, clobetasol, halobetasol, betamethasone, hydrocortisone) can cause thinning and lightening of the skin if they are used for too long in the same area. Your physician has selected the right strength medicine for your problem and area affected on the body. Please use your medication only as directed by your physician to prevent side effects.    clobetasol cream (TEMOVATE) 0.05 % - neck, body areas Apply twice daily to affected areas with rash up to 2 weeks. Avoid applying to face, groin, and axilla.  Tinea pedis of left foot Left Foot - Anterior  Start Terbinafine cream twice daily to feet until clear then one additional week. Use once weekly for maintenance.   Nummular dermatitis Left Ankle - lateral  Chronic and persistent condition with duration over one year. Condition is symptomatic/ bothersome to patient. Not currently at goal.  Use Opzelura here twice a day (can use to up to 10% BSA). Can use Clobetasol cream if needed up to 2 weeks.   Topical steroids (such as triamcinolone, fluocinolone, fluocinonide, mometasone, clobetasol, halobetasol, betamethasone, hydrocortisone) can cause thinning and lightening of the skin if they are used for too long in the same area. Your physician has selected the right strength medicine for your problem and  area affected on the body. Please use your medication only as directed by your physician to prevent side effects.    Related Medications clobetasol ointment (TEMOVATE) 3.00 % Apply 1 application topically 2 (two) times daily. To affected areas of rash for up to 2-3 weeks. Avoid applying to face, groin, and axilla. Use as directed. Long-term use can cause thinning of the skin.  Ruxolitinib Phosphate (OPZELURA) 1.5 % CREA Apply 1 application topically daily.   Return for Rash Follow Up, AK Follow Up 6-8 weeks.  I, Emelia Salisbury, CMA, am acting as scribe for Forest Gleason, MD.  Documentation: I have reviewed the above documentation for accuracy and completeness, and I agree with the above.  Forest Gleason, MD

## 2022-04-04 ENCOUNTER — Inpatient Hospital Stay (HOSPITAL_BASED_OUTPATIENT_CLINIC_OR_DEPARTMENT_OTHER): Payer: Medicare Other | Admitting: Nurse Practitioner

## 2022-04-04 ENCOUNTER — Inpatient Hospital Stay: Payer: Medicare Other | Attending: Internal Medicine

## 2022-04-04 ENCOUNTER — Other Ambulatory Visit: Payer: Self-pay

## 2022-04-04 ENCOUNTER — Encounter: Payer: Self-pay | Admitting: Dermatology

## 2022-04-04 ENCOUNTER — Encounter: Payer: Self-pay | Admitting: Nurse Practitioner

## 2022-04-04 VITALS — BP 157/76 | HR 88 | Temp 99.3°F | Resp 20 | Ht 66.5 in | Wt 185.5 lb

## 2022-04-04 DIAGNOSIS — Z79899 Other long term (current) drug therapy: Secondary | ICD-10-CM | POA: Insufficient documentation

## 2022-04-04 DIAGNOSIS — Z5111 Encounter for antineoplastic chemotherapy: Secondary | ICD-10-CM | POA: Insufficient documentation

## 2022-04-04 DIAGNOSIS — Z170 Estrogen receptor positive status [ER+]: Secondary | ICD-10-CM

## 2022-04-04 DIAGNOSIS — Z87891 Personal history of nicotine dependence: Secondary | ICD-10-CM | POA: Insufficient documentation

## 2022-04-04 DIAGNOSIS — Z9049 Acquired absence of other specified parts of digestive tract: Secondary | ICD-10-CM | POA: Diagnosis not present

## 2022-04-04 DIAGNOSIS — R7303 Prediabetes: Secondary | ICD-10-CM | POA: Diagnosis not present

## 2022-04-04 DIAGNOSIS — C50811 Malignant neoplasm of overlapping sites of right female breast: Secondary | ICD-10-CM | POA: Insufficient documentation

## 2022-04-04 LAB — CBC WITH DIFFERENTIAL/PLATELET
Abs Immature Granulocytes: 0.6 10*3/uL — ABNORMAL HIGH (ref 0.00–0.07)
Basophils Absolute: 0.1 10*3/uL (ref 0.0–0.1)
Basophils Relative: 1 %
Eosinophils Absolute: 0 10*3/uL (ref 0.0–0.5)
Eosinophils Relative: 0 %
HCT: 37.1 % (ref 36.0–46.0)
Hemoglobin: 12.3 g/dL (ref 12.0–15.0)
Immature Granulocytes: 4 %
Lymphocytes Relative: 11 %
Lymphs Abs: 1.8 10*3/uL (ref 0.7–4.0)
MCH: 33.1 pg (ref 26.0–34.0)
MCHC: 33.2 g/dL (ref 30.0–36.0)
MCV: 99.7 fL (ref 80.0–100.0)
Monocytes Absolute: 1 10*3/uL (ref 0.1–1.0)
Monocytes Relative: 6 %
Neutro Abs: 13.2 10*3/uL — ABNORMAL HIGH (ref 1.7–7.7)
Neutrophils Relative %: 78 %
Platelets: 151 10*3/uL (ref 150–400)
RBC: 3.72 MIL/uL — ABNORMAL LOW (ref 3.87–5.11)
RDW: 14 % (ref 11.5–15.5)
WBC: 16.7 10*3/uL — ABNORMAL HIGH (ref 4.0–10.5)
nRBC: 0.4 % — ABNORMAL HIGH (ref 0.0–0.2)

## 2022-04-04 LAB — BASIC METABOLIC PANEL
Anion gap: 10 (ref 5–15)
BUN: 17 mg/dL (ref 8–23)
CO2: 29 mmol/L (ref 22–32)
Calcium: 9.9 mg/dL (ref 8.9–10.3)
Chloride: 96 mmol/L — ABNORMAL LOW (ref 98–111)
Creatinine, Ser: 0.71 mg/dL (ref 0.44–1.00)
GFR, Estimated: >60 mL/min (ref >60)
Glucose, Bld: 157 mg/dL — ABNORMAL HIGH (ref 70–99)
Potassium: 3.5 mmol/L (ref 3.5–5.1)
Sodium: 135 mmol/L (ref 135–145)

## 2022-04-04 NOTE — Progress Notes (Signed)
[facility] NOTE  Patient Care Team: Leone Haven, MD as PCP - General (Family Medicine) Kate Sable, MD as PCP - Cardiology (Cardiology) Bary Castilla Forest Gleason, MD as Consulting Physician (General Surgery) Dallas Schimke, MD (Internal Medicine) Daiva Huge, RN as Oncology Nurse Navigator  CHIEF COMPLAINTS/PURPOSE OF CONSULTATION: Breast cancer  #  Oncology History Overview Note  # 2009- Sigmoid colon cancer stage II (T3 N0 4 lymph nodes sampled M0 stage II high risk there was obstruction and perforation abscess; Dr.Hern), workup included a low CEA preoperatively and a chest x-ray and CT scan abdomen pelvis negative for metastatic disease, S/P colostomy; s/p FOLFOX x 6 months. [Dr.Gittin ]  # LATER REVERSED , K RAS  wild type B RAF not evaluated   Also initial iron deficiency with anemia thrombocytosis, high platelets considered reactive, workup includes a normal PCR for CML and a negative [id number] mutation  DIAGNOSIS:  A. BREAST, RIGHT; LUMPECTOMY:  - INVASIVE MAMMARY CARCINOMA.  - DUCTAL CARCINOMA IN SITU (DCIS).  - SEE CANCER SUMMARY BELOW.  - BIOPSY SITE CHANGE WITH CLIP (2).  - FIBROUS TISSUE WITH CYST FORMATION AND FLORID DUCTAL HYPERPLASIA.  - SKIN AND NIPPLE NEGATIVE FOR MALIGNANCY.  - VASCULAR CALCIFICATION.   B. SENTINEL LYMPH NODES 1 AND 2, RIGHT AXILLA; EXCISION:  - METASTATIC CARCINOMA INVOLVES ONE OF TWO LYMPH NODES (1/2).   C. NON-SENTINEL LYMPH NODES, RIGHT AXILLA; EXCISION:  - ONE LYMPH NODE NEGATIVE FOR MALIGNANCY (0/1).   CANCER CASE SUMMARY: INVASIVE CARCINOMA OF THE BREAST  Standard(s): AJCC-UICC 8   SPECIMEN  Procedure: Lumpectomy  Specimen Laterality: Right   TUMOR  Histologic Type: Invasive carcinoma of no special type (ductal)  Histologic Grade (Nottingham Histologic Score)       Glandular (Acinar)/Tubular Differentiation: 3       Nuclear Pleomorphism: 3       Mitotic Rate: 3       Overall Grade: 3  Tumor Size:  24 mm  Tumor Focality: Single focus of invasive carcinoma  Ductal Carcinoma In Situ (DCIS): Present, nuclear grade 2-3  Tumor Extent: Not applicable  Lymphatic and/or Vascular Invasion: Present  Treatment Effect in the Breast: No known presurgical therapy   MARGINS  Margin Status for Invasive Carcinoma: All margins negative for invasive  carcinoma       Distance from closest margin: 10 mm       Specify closest margin: Superior   Margin Status for DCIS: All margins negative for DCIS       Distance from DCIS to closest margin: 10 mm       Specify closest margin: Superior   REGIONAL LYMPH NODES  Regional Lymph Node Status: Tumor present in regional lymph node(s)       Number of Lymph Nodes with Macrometastases (greater than 2 mm): 1       Number of Lymph Nodes with Micrometastases (greater than 0.2 mm to  2 mm and/or greater than 200 cells): 0       Number of Lymph Nodes with Isolated Tumor Cells (0.2 mm or less OR  200 cells or less): 0       Size of Largest Metastatic Deposit: 3 mm      Extranodal Extension: Not identified       Total Number of Lymph Nodes Examined (sentinel and non-sentinel): 3       Number of Sentinel Nodes Examined: 2   DISTANT METASTASIS  Distant Site(s) Involved, if applicable: Not applicable  PATHOLOGIC STAGE CLASSIFICATION (pTNM, AJCC 8th Edition):  Modified Classification: Not applicable  pT Category: pT2  T Suffix: Not applicable  pN Category: pN1a  N Suffix: (sn)  pM Category: Not applicable   SPECIAL STUDIES  Breast Biomarker Testing Performed on Previous Outside Biopsy:  23-250-T11  Per outside report:  Estrogen Receptor (ER) Status: POSITIVE          Percentage of cells with nuclear positivity: Greater than 90%          Average intensity of staining: Strong   Progesterone Receptor (PgR) Status: POSITIVE          Percentage of cells with nuclear positivity: 60%          Average intensity of staining: Strong   HER2 (by  immunohistochemistry): EQUIVOCAL (Score 2+)  HER2 FISH: NEGATIVE   Ki-67: Not performed   IMPRESSION: 1. Complex cystic and solid-appearing mass within the subareolar RIGHT breast, overall measuring 3.1 cm greatest dimension, the solid enhancing component at the anterior-lateral aspect of the mass measuring 2 x 1 cm, abutting the overlying skin but without evidence of involvement/enhancement of the skin. Susceptibility artifact within the mass may represent a biopsy clip (if biopsy has been performed at an outside site since the diagnostic mammogram/ultrasound of 12/28/2021) or this artifact could represent calcifications within the mass. This mass corresponds to the recent ultrasound finding of a complex irregular mass at the 11 o'clock axis of the retroareolar RIGHT breast. 2. No evidence of multifocal or multicentric disease within the RIGHT breast. 3. No evidence of contralateral disease in the LEFT breast.   RECOMMENDATION: If has not already been performed, recommend ultrasound-guided biopsy of the suspicious mass in the subareolar RIGHT breast, with preferential targeting of the anterior-lateral component of the mass which is shown to be the enhancing component on today's MRI.   BI-RADS CATEGORY  4: Suspicious.     Cancer of sigmoid ([place]) (Resolved)  Carcinoma of overlapping sites of right breast in female, estrogen receptor positive ([place])  02/14/2022 Initial Diagnosis   Carcinoma of overlapping sites of right breast in female, estrogen receptor positive ([place])   02/14/2022 Cancer Staging   Staging form: Breast, AJCC 8th Edition - Pathologic: Stage IIA (pT2, pN1, cM0, G3, ER+, PR+, HER2-) - Signed by Cammie Sickle, MD on 02/14/2022 Histologic grading system: 3 grade system   02/23/2022 -  Chemotherapy   Patient is on Treatment Plan : BREAST TC q21d        HISTORY OF PRESENTING ILLNESS: Ambulating independently  Sabrina Wilson 81 y.o.  female patient with  triple negative breast cancer stage II -currently on adjuvant chemotherapy with Taxotere + Cytoxan is here for follow-up after cycle 2 of chemotherapy. Tolerating treatment well. No additional rash. Complicated by fatigue but not worse. No new complaints. Plans to see her grand daughter graduate from [employer] this weekend.    Review of Systems  Constitutional:  Negative for chills, diaphoresis, fever, malaise/fatigue and weight loss.  HENT:  Negative for nosebleeds and sore throat.   Eyes:  Negative for double vision.  Respiratory:  Negative for cough, hemoptysis, sputum production, shortness of breath and wheezing.   Cardiovascular:  Negative for chest pain, palpitations, orthopnea and leg swelling.  Gastrointestinal:  Negative for abdominal pain, blood in stool, constipation, diarrhea, heartburn, melena, nausea and vomiting.  Genitourinary:  Negative for dysuria, frequency and urgency.  Musculoskeletal:  Negative for back pain and joint pain.  Skin: Negative.  Negative for itching and  rash.  Neurological:  Negative for dizziness, tingling, focal weakness, weakness and headaches.  Endo/Heme/Allergies:  Does not bruise/bleed easily.  Psychiatric/Behavioral:  Negative for depression. The patient is not nervous/anxious and does not have insomnia.      MEDICAL HISTORY:  Past Medical History:  Diagnosis Date   Actinic keratosis 02/12/2020   R lat calf (hypertrophic)   Arthritis    Cancer of sigmoid ([place]) 06/17/2016   Colon cancer ([place]) 2009   T3,N0; s/p resection  Dr. Hulda Humphrey and chemotherapy by Dr. Cynda Acres   Diastolic dysfunction    a. 08/2020 Echo: EF 60-65%, no rwma, Gr1 DD, nl RV size/fxn. Mild MR.   Hernia of flank    History of actinic keratoses 08/11/2020   Bx proven at left lateral ankle proximal, LN2 10/08/20   History of stress test    a. 09/2020 MV: EF >65%. No ischemia/infarct. Mild Ao Ca2+ and minimal Cor Ca2+.   Hypertension    Neuropathy    Polyp at cervical os    s/p  resection Dr. Sabra Heck   Skin cancer 01/22/2020   surface of an atypical verrucous squamous proliferation    Squamous cell carcinoma of skin 07/16/2020   Left lateral ankle anterior, treated with [facility] 08-11-2020    SURGICAL HISTORY: Past Surgical History:  Procedure Laterality Date   BREAST BIOPSY Right 05/2014   Dr. Dwyane Luo office-benign   BREAST LUMPECTOMY WITH SENTINEL LYMPH NODE BIOPSY Right 01/21/2022   Procedure: BREAST LUMPECTOMY WITH SENTINEL LYMPH NODE BX;  Surgeon: Robert Bellow, MD;  Location: [facility] ORS;  Service: General;  Laterality: Right;   BREAST SURGERY Right February 2016   Vacuum assisted biopsy for recurrent cyst, fibrocystic changes without evidence of malignancy.   CATARACT EXTRACTION W/PHACO Left 01/13/2015   Procedure: CATARACT EXTRACTION PHACO AND INTRAOCULAR LENS PLACEMENT (IOC);  Surgeon: Birder Robson, MD;  Location: [facility] ORS;  Service: Ophthalmology;  Laterality: Left;  [place]  1:02.9 AP   19.2 CDE  12.06 casette lot #  [id number]   CATARACT EXTRACTION W/PHACO Right 02/03/2015   Procedure: CATARACT EXTRACTION PHACO AND INTRAOCULAR LENS PLACEMENT (IOC);  Surgeon: Birder Robson, MD;  Location: [facility] ORS;  Service: Ophthalmology;  Laterality: Right;  us00:53 ap46.5 [id number]   COLECTOMY     COLONOSCOPY  2015   Dr Candace Cruise   COLONOSCOPY WITH PROPOFOL N/A 09/13/2017   Procedure: COLONOSCOPY WITH PROPOFOL;  Surgeon: Robert Bellow, MD;  Location[facility] END[facility]COPY;  Service: Endoscopy;  Laterality: N/A;   COLONOSCOPY WITH PROPOFOL N/A 11/08/2017   Procedure: COLONOSCOPY WITH PROPOFOL;  Surgeon: Robert Bellow, MD;  Location: [facility] ENDOSCOPY;  Service: Endoscopy;  Laterality: N/A;   colostomy reversal     DILATION AND CURETTAGE OF UTERUS  2014   Dr. Sabra Heck, benign per pt   EYE SURGERY     HERNIA REPAIR  07/13/12   ventral    SKIN CANCER EXCISION     TONSILLECTOMY      SOCIAL HISTORY: Social History   Socioeconomic History   Marital status: Married     Spouse name: Not on file   Number of children: Not on file   Years of education: Not on file   Highest education level: Not on file  Occupational History   Not on file  Tobacco Use   Smoking status: Former    Types: Cigarettes    Quit date: 10/12/2007    Years since quitting: 14.4   Smokeless tobacco: Never  Vaping Use   Vaping Use: Never used  Substance and Sexual Activity   Alcohol use: Yes    Alcohol/week: 1.0 standard drink of alcohol    Types: 1 Standard drinks or equivalent per week    Comment: with dinner GLASS OF WINE EACH DAY   Drug use: No   Sexual activity: Not on file  Other Topics Concern   Not on file  Social History Narrative   Lives in [place] with husband. 2 children, son lives nearby.Diet - regularExercise - none. 30 mins/in [place]. Quit smoking in 2009. Wine before dinner. Pianist in church; real estate; Adult nurse.    Social Determinants of Health   Financial Resource Strain: Low Risk  (11/13/2018)   Overall Financial Resource Strain (CARDIA)    Difficulty of Paying Living Expenses: Not hard at all  Food Insecurity: No Food Insecurity (11/16/2020)   Hunger Vital Sign    Worried About Running Out of Food in the Last Year: Never true    Ran Out of Food in the Last Year: Never true  Transportation Needs: No Transportation Needs (11/16/2020)   PRAPARE - Hydrologist (Medical): No    Lack of Transportation (Non-Medical): No  Physical Activity: Not on file  Stress: No Stress Concern Present (11/16/2020)   [place]    Feeling of Stress : Not at all  Social Connections: Not on file  Intimate Partner Violence: Not At Risk (11/16/2020)   Humiliation, Afraid, Rape, and Kick questionnaire    Fear of Current or Ex-Partner: No    Emotionally Abused: No    Physically Abused: No    Sexually Abused: No    FAMILY HISTORY: Family History  Problem Relation Age of  Onset   Stroke Mother    Diabetes Mother    Cancer Father        colon   Stroke Sister    Cancer Brother        colon   Cancer Brother        brain    ALLERGIES:  is allergic to sulfa antibiotics.  MEDICATIONS:  Current Outpatient Medications  Medication Sig Dispense Refill   atorvastatin (LIPITOR) 10 MG tablet TAKE ONE TABLET BY MOUTH DAILY 90 tablet 1   Calcium Carbonate-Vit D-Min (CALCIUM 1200 PO) Take by mouth daily.     clobetasol cream (TEMOVATE) 0.05 % Apply twice daily to affected areas with rash up to 2 weeks. Avoid applying to face, groin, and axilla. 60 g 1   clobetasol ointment (TEMOVATE) 6.23 % Apply 1 application topically 2 (two) times daily. To affected areas of rash for up to 2-3 weeks. Avoid applying to face, groin, and axilla. Use as directed. Long-term use can cause thinning of the skin. 45 g 1   dexamethasone (DECADRON) 4 MG tablet Take 1 tablet (4 mg total) by mouth 2 (two) times daily with a meal. Start 1 days prior to chemo. Take for 1 day ONLY.  DO NOT take on the day of chemo. (Patient not taking: Reported on 03/02/2022) 60 tablet 0   hydrochlorothiazide (HYDRODIURIL) 12.5 MG tablet TAKE ONE TABLET BY MOUTH DAILY 90 tablet 1   methylPREDNISolone (MEDROL DOSEPAK) 4 MG TBPK tablet Use as directed. 21 tablet 1   niacinamide 500 MG tablet Take 500 mg by mouth 2 (two) times daily.     Omega-3 Fatty Acids (FISH OIL PO) Take by mouth daily.     ondansetron (ZOFRAN) 8 MG tablet One pill every 8 hours  as needed for nausea/vomitting. (Patient not taking: Reported on 03/02/2022) 40 tablet 1   predniSONE (DELTASONE) 10 MG tablet Take 6 tablets (60 mg) x1 day, then take 5 tablets (50 mg) x1 day, then take 4 tablets (40 mg) x1 day, then take 3 tablets (30 mg) x1 day, then take 2 tablets (20 mg) x1 day, then take 1 tablet (10 mg) x1 day, then stop (Patient not taking: Reported on 03/21/2022) 21 tablet 0   prochlorperazine (COMPAZINE) 10 MG tablet Take 1 tablet (10 mg total) by  mouth every 6 (six) hours as needed for nausea or vomiting. (Patient not taking: Reported on 03/02/2022) 40 tablet 1   Ruxolitinib Phosphate (OPZELURA) 1.5 % CREA Apply 1 application topically daily. 60 g 2   sodium fluoride (FLUORISHIELD) 1.1 % GEL dental gel Place onto teeth.     VITAMIN D, CHOLECALCIFEROL, PO Take 2,000 Units by mouth daily.     No current facility-administered medications for this visit.    PHYSICAL EXAMINATION: Vitals:   04/04/22 1404  BP: (!) 157/76  Pulse: 88  Resp: 20  Temp: 99.3 F (37.4 C)   Filed Weights   04/04/22 1404  Weight: 185 lb 8 oz (84.1 kg)    Physical Exam Vitals reviewed.  Constitutional:      Appearance: She is not ill-appearing.  HENT:     Head: Normocephalic.  Cardiovascular:     Rate and Rhythm: Normal rate and regular rhythm.  Pulmonary:     Effort: Pulmonary effort is normal. No respiratory distress.     Comments: Decreased breath sounds bilaterally.  Musculoskeletal:        General: No deformity.     Cervical back: Normal range of motion.     Comments: Ambulating w/o aids  Skin:    General: Skin is warm.     Coloration: Skin is not pale.     Findings: No rash.  Neurological:     General: No focal deficit present.     Mental Status: She is alert and oriented to person, place, and time.  Psychiatric:        Mood and Affect: Mood normal.        Behavior: Behavior normal.      LABORATORY DATA:  I have reviewed the data as listed Lab Results  Component Value Date   WBC 16.7 (H) 04/04/2022   HGB 12.3 04/04/2022   HCT 37.1 04/04/2022   MCV 99.7 04/04/2022   PLT 151 04/04/2022   Recent Labs    02/14/22 1546 02/23/22 0814 03/02/22 1249 03/21/22 0915  NA 134* 136 131* 134*  K 3.5 4.0 3.8 3.7  CL 96* 102 96* 101  CO2 _0 GLUCOSE 104* 145* 115* 133*  BUN _1 CREATININE 0.84 0.78 0.85 0.80  CALCIUM 10.0 10.1 10.0 9.8  GFRNONAA >60 >60 >60 >60  PROT 8.1 7.5  --  7.0  ALBUMIN 4.4 4.2  --  3.8   AST 29 25  --  26  ALT 20 20  --  20  ALKPHOS 65 59  --  53  BILITOT 0.7 0.4  --  0.5     RADIOGRAPHIC STUDIES: I have personally reviewed the radiological images as listed and agreed with the findings in the report. No results found.  ASSESSMENT & PLAN:   No problem-specific Assessment & Plan notes found for this encounter.  Carcinoma of overlapping sites of right breast in female, estrogen receptor positive ([place]) #  Right breast cancer -[facility]; ER;PR positive G-3; LVI + T2N1.  Stage II [OCT 2023; Dr.Byrnett] s/p Lumpectomy; node positive-Oncotype RS- 35-which translates to approximately 30% risk of recurrence with endocrine therapy alone. Given high risk Oncotype-proceed with adjuvant chemotherapy with Taxotere-Cytoxan every 3 weeks for 4 cycles. Consider abema x2 years   # Currently s/p Taxotere-Cytoxan cycle 2. Tolerating better.   # Rash- likely related to chemotherapy. S/p antihistamines and steroids. Resolved. Saw Dr Laurence Ferrari who recommended if rash recurs to consider topicals.   # Prediabetes-monitor closely on steroids/chemotherapy.; PBG- 157- monitor for now on steroids.    # DISPOSITION: Follow up with Dr Rogue Bussing as scheduled for cycle 3 or rtc sooner if concerning symptoms.   All questions were answered. The patient/family knows to call the clinic with any problems, questions or concerns.  Sabrina Au, NP 04/04/2022

## 2022-04-08 MED FILL — Dexamethasone Sodium Phosphate Inj 100 MG/10ML: INTRAMUSCULAR | Qty: 1 | Status: AC

## 2022-04-11 ENCOUNTER — Inpatient Hospital Stay (HOSPITAL_BASED_OUTPATIENT_CLINIC_OR_DEPARTMENT_OTHER): Payer: Medicare Other | Admitting: Nurse Practitioner

## 2022-04-11 ENCOUNTER — Encounter: Payer: Self-pay | Admitting: Nurse Practitioner

## 2022-04-11 ENCOUNTER — Encounter: Payer: Self-pay | Admitting: Internal Medicine

## 2022-04-11 ENCOUNTER — Other Ambulatory Visit: Payer: Self-pay

## 2022-04-11 ENCOUNTER — Inpatient Hospital Stay: Payer: Medicare Other

## 2022-04-11 VITALS — BP 145/87 | HR 83 | Temp 98.7°F | Resp 16 | Wt 184.8 lb

## 2022-04-11 DIAGNOSIS — Z170 Estrogen receptor positive status [ER+]: Secondary | ICD-10-CM

## 2022-04-11 DIAGNOSIS — R7303 Prediabetes: Secondary | ICD-10-CM | POA: Diagnosis not present

## 2022-04-11 DIAGNOSIS — Z87891 Personal history of nicotine dependence: Secondary | ICD-10-CM | POA: Diagnosis not present

## 2022-04-11 DIAGNOSIS — C50811 Malignant neoplasm of overlapping sites of right female breast: Secondary | ICD-10-CM

## 2022-04-11 DIAGNOSIS — E785 Hyperlipidemia, unspecified: Secondary | ICD-10-CM

## 2022-04-11 DIAGNOSIS — Z5111 Encounter for antineoplastic chemotherapy: Secondary | ICD-10-CM

## 2022-04-11 DIAGNOSIS — Z79899 Other long term (current) drug therapy: Secondary | ICD-10-CM | POA: Diagnosis not present

## 2022-04-11 DIAGNOSIS — Z9049 Acquired absence of other specified parts of digestive tract: Secondary | ICD-10-CM | POA: Diagnosis not present

## 2022-04-11 LAB — CBC WITH DIFFERENTIAL/PLATELET
Abs Immature Granulocytes: 0.04 10*3/uL (ref 0.00–0.07)
Basophils Absolute: 0 10*3/uL (ref 0.0–0.1)
Basophils Relative: 0 %
Eosinophils Absolute: 0 10*3/uL (ref 0.0–0.5)
Eosinophils Relative: 0 %
HCT: 32.5 % — ABNORMAL LOW (ref 36.0–46.0)
Hemoglobin: 11.1 g/dL — ABNORMAL LOW (ref 12.0–15.0)
Immature Granulocytes: 1 %
Lymphocytes Relative: 17 %
Lymphs Abs: 1.2 10*3/uL (ref 0.7–4.0)
MCH: 33.2 pg (ref 26.0–34.0)
MCHC: 34.2 g/dL (ref 30.0–36.0)
MCV: 97.3 fL (ref 80.0–100.0)
Monocytes Absolute: 0.7 10*3/uL (ref 0.1–1.0)
Monocytes Relative: 10 %
Neutro Abs: 5.2 10*3/uL (ref 1.7–7.7)
Neutrophils Relative %: 72 %
Platelets: 384 10*3/uL (ref 150–400)
RBC: 3.34 MIL/uL — ABNORMAL LOW (ref 3.87–5.11)
RDW: 14.6 % (ref 11.5–15.5)
WBC: 7.2 10*3/uL (ref 4.0–10.5)
nRBC: 0 % (ref 0.0–0.2)

## 2022-04-11 LAB — COMPREHENSIVE METABOLIC PANEL
ALT: 17 U/L (ref 0–44)
AST: 25 U/L (ref 15–41)
Albumin: 3.7 g/dL (ref 3.5–5.0)
Alkaline Phosphatase: 61 U/L (ref 38–126)
Anion gap: 11 (ref 5–15)
BUN: 22 mg/dL (ref 8–23)
CO2: 24 mmol/L (ref 22–32)
Calcium: 9.6 mg/dL (ref 8.9–10.3)
Chloride: 100 mmol/L (ref 98–111)
Creatinine, Ser: 0.88 mg/dL (ref 0.44–1.00)
GFR, Estimated: >60 mL/min (ref >60)
Glucose, Bld: 149 mg/dL — ABNORMAL HIGH (ref 70–99)
Potassium: 3.9 mmol/L (ref 3.5–5.1)
Sodium: 135 mmol/L (ref 135–145)
Total Bilirubin: 0.4 mg/dL (ref 0.3–1.2)
Total Protein: 6.8 g/dL (ref 6.5–8.1)

## 2022-04-11 MED ORDER — SODIUM CHLORIDE 0.9 % IV SOLN
Freq: Once | INTRAVENOUS | Status: AC
  Filled 2022-04-11: qty 250

## 2022-04-11 MED ORDER — SODIUM CHLORIDE 0.9 % IV SOLN
10.0000 mg | Freq: Once | INTRAVENOUS | Status: AC
  Administered 2022-04-11: 10 mg via INTRAVENOUS
  Filled 2022-04-11: qty 10

## 2022-04-11 MED ORDER — SODIUM CHLORIDE 0.9 % IV SOLN
75.0000 mg/m2 | Freq: Once | INTRAVENOUS | Status: AC
  Administered 2022-04-11: 150 mg via INTRAVENOUS
  Filled 2022-04-11: qty 15

## 2022-04-11 MED ORDER — PALONOSETRON HCL INJECTION 0.25 MG/5ML
0.2500 mg | Freq: Once | INTRAVENOUS | Status: AC
  Administered 2022-04-11: 0.25 mg via INTRAVENOUS
  Filled 2022-04-11: qty 5

## 2022-04-11 MED ORDER — SODIUM CHLORIDE 0.9 % IV SOLN
600.0000 mg/m2 | Freq: Once | INTRAVENOUS | Status: AC
  Administered 2022-04-11: 1200 mg via INTRAVENOUS
  Filled 2022-04-11: qty 50

## 2022-04-11 MED ORDER — ATORVASTATIN CALCIUM 10 MG PO TABS: 10.0000 mg | ORAL_TABLET | Freq: Every day | ORAL | 1 refills | Status: DC

## 2022-04-11 NOTE — Progress Notes (Signed)
[name] CONSULT NOTE  Patient Care Team: Leone Haven, MD as PCP - General (Family Medicine) Kate Sable, MD as PCP - Cardiology (Cardiology) Bary Castilla Forest Gleason, MD as Consulting Physician (General Surgery) Dallas Schimke, MD (Internal Medicine) Daiva Huge, RN as Oncology Nurse Navigator  CHIEF COMPLAINTS/PURPOSE OF CONSULTATION: Breast cancer  #  Oncology History Overview Note  # 2009- Sigmoid colon cancer stage II (T3 N0 4 lymph nodes sampled M0 stage II high risk there was obstruction and perforation abscess; Dr.Hern), workup included a low CEA preoperatively and a chest x-ray and CT scan abdomen pelvis negative for metastatic disease, S/P colostomy; s/p FOLFOX x 6 months. [Dr.Gittin ]  # LATER REVERSED , K RAS  wild type B RAF not evaluated   Also initial iron deficiency with anemia thrombocytosis, high platelets considered reactive, workup includes a normal PCR for CML and a negative [id number] mutation  DIAGNOSIS:  A. BREAST, RIGHT; LUMPECTOMY:  - INVASIVE MAMMARY CARCINOMA.  - DUCTAL CARCINOMA IN SITU (DCIS).  - SEE CANCER SUMMARY BELOW.  - BIOPSY SITE CHANGE WITH CLIP (2).  - FIBROUS TISSUE WITH CYST FORMATION AND FLORID DUCTAL HYPERPLASIA.  - SKIN AND NIPPLE NEGATIVE FOR MALIGNANCY.  - VASCULAR CALCIFICATION.   B. SENTINEL LYMPH NODES 1 AND 2, RIGHT AXILLA; EXCISION:  - METASTATIC CARCINOMA INVOLVES ONE OF TWO LYMPH NODES (1/2).   C. NON-SENTINEL LYMPH NODES, RIGHT AXILLA; EXCISION:  - ONE LYMPH NODE NEGATIVE FOR MALIGNANCY (0/1).   CANCER CASE SUMMARY: INVASIVE CARCINOMA OF THE BREAST  Standard(s): AJCC-UICC 8   SPECIMEN  Procedure: Lumpectomy  Specimen Laterality: Right   TUMOR  Histologic Type: Invasive carcinoma of no special type (ductal)  Histologic Grade (Nottingham Histologic Score)       Glandular (Acinar)/Tubular Differentiation: 3       Nuclear Pleomorphism: 3       Mitotic Rate: 3       Overall Grade: 3  Tumor  Size: 24 mm  Tumor Focality: Single focus of invasive carcinoma  Ductal Carcinoma In Situ (DCIS): Present, nuclear grade 2-3  Tumor Extent: Not applicable  Lymphatic and/or Vascular Invasion: Present  Treatment Effect in the Breast: No known presurgical therapy   MARGINS  Margin Status for Invasive Carcinoma: All margins negative for invasive  carcinoma       Distance from closest margin: 10 mm       Specify closest margin: Superior   Margin Status for DCIS: All margins negative for DCIS       Distance from DCIS to closest margin: 10 mm       Specify closest margin: Superior   REGIONAL LYMPH NODES  Regional Lymph Node Status: Tumor present in regional lymph node(s)       Number of Lymph Nodes with Macrometastases (greater than 2 mm): 1       Number of Lymph Nodes with Micrometastases (greater than 0.2 mm to  2 mm and/or greater than 200 cells): 0       Number of Lymph Nodes with Isolated Tumor Cells (0.2 mm or less OR  200 cells or less): 0       Size of Largest Metastatic Deposit: 3 mm      Extranodal Extension: Not identified       Total Number of Lymph Nodes Examined (sentinel and non-sentinel): 3       Number of Sentinel Nodes Examined: 2   DISTANT METASTASIS  Distant Site(s) Involved, if applicable: Not applicable  PATHOLOGIC STAGE CLASSIFICATION (pTNM, AJCC 8th Edition):  Modified Classification: Not applicable  pT Category: pT2  T Suffix: Not applicable  pN Category: pN1a  N Suffix: (sn)  pM Category: Not applicable   SPECIAL STUDIES  Breast Biomarker Testing Performed on Previous Outside Biopsy:  23-250-T11  Per outside report:  Estrogen Receptor (ER) Status: POSITIVE          Percentage of cells with nuclear positivity: Greater than 90%          Average intensity of staining: Strong   Progesterone Receptor (PgR) Status: POSITIVE          Percentage of cells with nuclear positivity: 60%          Average intensity of staining: Strong   HER2 (by  immunohistochemistry): EQUIVOCAL (Score 2+)  HER2 FISH: NEGATIVE   Ki-67: Not performed   IMPRESSION: 1. Complex cystic and solid-appearing mass within the subareolar RIGHT breast, overall measuring 3.1 cm greatest dimension, the solid enhancing component at the anterior-lateral aspect of the mass measuring 2 x 1 cm, abutting the overlying skin but without evidence of involvement/enhancement of the skin. Susceptibility artifact within the mass may represent a biopsy clip (if biopsy has been performed at an outside site since the diagnostic mammogram/ultrasound of 12/28/2021) or this artifact could represent calcifications within the mass. This mass corresponds to the recent ultrasound finding of a complex irregular mass at the 11 o'clock axis of the retroareolar RIGHT breast. 2. No evidence of multifocal or multicentric disease within the RIGHT breast. 3. No evidence of contralateral disease in the LEFT breast.   RECOMMENDATION: If has not already been performed, recommend ultrasound-guided biopsy of the suspicious mass in the subareolar RIGHT breast, with preferential targeting of the anterior-lateral component of the mass which is shown to be the enhancing component on today's MRI.   BI-RADS CATEGORY  4: Suspicious.     Cancer of sigmoid ([place]) (Resolved)  Carcinoma of overlapping sites of right breast in female, estrogen receptor positive ([place])  02/14/2022 Initial Diagnosis   Carcinoma of overlapping sites of right breast in female, estrogen receptor positive ([place])   02/14/2022 Cancer Staging   Staging form: Breast, AJCC 8th Edition - Pathologic: Stage IIA (pT2, pN1, cM0, G3, ER+, PR+, HER2-) - Signed by Cammie Sickle, MD on 02/14/2022 Histologic grading system: 3 grade system   02/23/2022 -  Chemotherapy   Patient is on Treatment Plan : BREAST TC q21d       HISTORY OF PRESENTING ILLNESS: with DIL. Ambulating independently.   Sabrina Wilson 81 y.o. female  diagnosed with triple negative breast cancer, stage II, currently on adjuvant chemotherapy with taxotere + cytoxan, returns to clinic for consideration of chemotherapy. Continues to tolerate treatment well. No additional or recurrent rash. Myalgias and bone pain have resolved. Denies any changes in her chronic neuropathy. No new pains. Eating and drinking well. Accompanied by her son who works at surgical oncology nursing at [employer].   Review of Systems  Constitutional:  Negative for chills, diaphoresis, fever, malaise/fatigue and weight loss.  HENT:  Negative for nosebleeds and sore throat.   Eyes:  Negative for double vision.  Respiratory:  Negative for cough, hemoptysis, sputum production, shortness of breath and wheezing.   Cardiovascular:  Negative for chest pain, palpitations, orthopnea and leg swelling.  Gastrointestinal:  Negative for abdominal pain, blood in stool, constipation, diarrhea, heartburn, melena, nausea and vomiting.  Genitourinary:  Negative for dysuria, frequency and urgency.  Musculoskeletal:  Negative for  back pain and joint pain.  Skin: Negative.  Negative for itching and rash.  Neurological:  Negative for dizziness, tingling, focal weakness, weakness and headaches.  Endo/Heme/Allergies:  Does not bruise/bleed easily.  Psychiatric/Behavioral:  Negative for depression. The patient is not nervous/anxious and does not have insomnia.      MEDICAL HISTORY:  Past Medical History:  Diagnosis Date   Actinic keratosis 02/12/2020   R lat calf (hypertrophic)   Arthritis    Cancer of sigmoid ([place]) 06/17/2016   Colon cancer ([place]) 2009   T3,N0; s/p resection  Dr. Hulda Humphrey and chemotherapy by Dr. Cynda Acres   Diastolic dysfunction    a. 08/2020 Echo: EF 60-65%, no rwma, Gr1 DD, nl RV size/fxn. Mild MR.   Hernia of flank    History of actinic keratoses 08/11/2020   Bx proven at left lateral ankle proximal, LN2 10/08/20   History of stress test    a. 09/2020 MV: EF >65%. No  ischemia/infarct. Mild Ao Ca2+ and minimal Cor Ca2+.   Hypertension    Neuropathy    Polyp at cervical os    s/p resection Dr. Sabra Heck   Skin cancer 01/22/2020   surface of an atypical verrucous squamous proliferation    Squamous cell carcinoma of skin 07/16/2020   Left lateral ankle anterior, treated with [facility] 08-11-2020    SURGICAL HISTORY: Past Surgical History:  Procedure Laterality Date   BREAST BIOPSY Right 05/2014   Dr. Dwyane Luo office-benign   BREAST LUMPECTOMY WITH SENTINEL LYMPH NODE BIOPSY Right 01/21/2022   Procedure: BREAST LUMPECTOMY WITH SENTINEL LYMPH NODE BX;  Surgeon: Robert Bellow, MD;  Location: [facility] ORS;  Service: General;  Laterality: Right;   BREAST SURGERY Right February 2016   Vacuum assisted biopsy for recurrent cyst, fibrocystic changes without evidence of malignancy.   CATARACT EXTRACTION W/PHACO Left 01/13/2015   Procedure: CATARACT EXTRACTION PHACO AND INTRAOCULAR LENS PLACEMENT (IOC);  Surgeon: Birder Robson, MD;  Location: [facility] ORS;  Service: Ophthalmology;  Laterality: Left;  [place]  1:02.9 AP   19.2 CDE  12.06 casette lot #  [id number]   CATARACT EXTRACTION W/PHACO Right 02/03/2015   Procedure: CATARACT EXTRACTION PHACO AND INTRAOCULAR LENS PLACEMENT (IOC);  Surgeon: Birder Robson, MD;  Location: [facility] ORS;  Service: Ophthalmology;  Laterality: Right;  us00:53 ap46.5 [id number]   COLECTOMY     COLONOSCOPY  2015   Dr Candace Cruise   COLONOSCOPY WITH PROPOFOL N/A 09/13/2017   Procedure: COLONOSCOPY WITH PROPOFOL;  Surgeon: Robert Bellow, MD;  Location: [facility] END[facility]COPY;  Service: Endoscopy;  Laterality: N/A;   COLONOSCOPY WITH PROPOFOL N/A 11/08/2017   Procedure: COLONOSCOPY WITH PROPOFOL;  Surgeon: Robert Bellow, MD;  Location: [facility] ENDOSCOPY;  Service: Endoscopy;  Laterality: N/A;   colostomy reversal     DILATION AND CURETTAGE OF UTERUS  2014   Dr. Sabra Heck, benign per pt   EYE SURGERY     HERNIA REPAIR  07/13/12   ventral    SKIN CANCER EXCISION      TONSILLECTOMY      SOCIAL HISTORY: Social History   Socioeconomic History   Marital status: Married    Spouse name: Not on file   Number of children: Not on file   Years of education: Not on file   Highest education level: Not on file  Occupational History   Not on file  Tobacco Use   Smoking status: Former    Types: Cigarettes    Quit date: 10/12/2007    Years since quitting: 35.5  Smokeless tobacco: Never  Vaping Use   Vaping Use: Never used  Substance and Sexual Activity   Alcohol use: Yes    Alcohol/week: 1.0 standard drink of alcohol    Types: 1 Standard drinks or equivalent per week    Comment: with dinner GLASS OF WINE EACH DAY   Drug use: No   Sexual activity: Not on file  Other Topics Concern   Not on file  Social History Narrative   Lives in [place] with husband. 2 children, son lives nearby.Diet - regularExercise - none. 30 mins/in [place]. Quit smoking in 2009. Wine before dinner. Pianist in church; real estate; Adult nurse.    Social Determinants of Health   Financial Resource Strain: Low Risk  (11/13/2018)   Overall Financial Resource Strain (CARDIA)    Difficulty of Paying Living Expenses: Not hard at all  Food Insecurity: No Food Insecurity (11/16/2020)   Hunger Vital Sign    Worried About Running Out of Food in the Last Year: Never true    Ran Out of Food in the Last Year: Never true  Transportation Needs: No Transportation Needs (11/16/2020)   PRAPARE - Hydrologist (Medical): No    Lack of Transportation (Non-Medical): No  Physical Activity: Not on file  Stress: No Stress Concern Present (11/16/2020)   [place]    Feeling of Stress : Not at all  Social Connections: Not on file  Intimate Partner Violence: Not At Risk (11/16/2020)   Humiliation, Afraid, Rape, and Kick questionnaire    Fear of Current or Ex-Partner: No    Emotionally Abused: No     Physically Abused: No    Sexually Abused: No    FAMILY HISTORY: Family History  Problem Relation Age of Onset   Stroke Mother    Diabetes Mother    Cancer Father        colon   Stroke Sister    Cancer Brother        colon   Cancer Brother        brain    ALLERGIES:  is allergic to sulfa antibiotics.  MEDICATIONS:  Current Outpatient Medications  Medication Sig Dispense Refill   atorvastatin (LIPITOR) 10 MG tablet Take 1 tablet (10 mg total) by mouth daily. 90 tablet 1   Calcium Carbonate-Vit D-Min (CALCIUM 1200 PO) Take by mouth daily.     clobetasol cream (TEMOVATE) 0.05 % Apply twice daily to affected areas with rash up to 2 weeks. Avoid applying to face, groin, and axilla. 60 g 1   clobetasol ointment (TEMOVATE) 9.24 % Apply 1 application topically 2 (two) times daily. To affected areas of rash for up to 2-3 weeks. Avoid applying to face, groin, and axilla. Use as directed. Long-term use can cause thinning of the skin. 45 g 1   hydrochlorothiazide (HYDRODIURIL) 12.5 MG tablet TAKE ONE TABLET BY MOUTH DAILY 90 tablet 1   niacinamide 500 MG tablet Take 500 mg by mouth 2 (two) times daily.     Omega-3 Fatty Acids (FISH OIL PO) Take by mouth daily.     Ruxolitinib Phosphate (OPZELURA) 1.5 % CREA Apply 1 application topically daily. 60 g 2   sodium fluoride (FLUORISHIELD) 1.1 % GEL dental gel Place onto teeth.     VITAMIN D, CHOLECALCIFEROL, PO Take 2,000 Units by mouth daily.     ondansetron (ZOFRAN) 8 MG tablet One pill every 8 hours as needed for nausea/vomitting. (Patient  not taking: Reported on 03/02/2022) 40 tablet 1   prochlorperazine (COMPAZINE) 10 MG tablet Take 1 tablet (10 mg total) by mouth every 6 (six) hours as needed for nausea or vomiting. (Patient not taking: Reported on 03/02/2022) 40 tablet 1   No current facility-administered medications for this visit.     PHYSICAL EXAMINATION: Vitals:   04/11/22 0932  BP: (!) 145/87  Pulse: 83  Resp: 16  Temp: 98.7 F  (37.1 C)  SpO2: 97%   Filed Weights   04/11/22 0932  Weight: 184 lb 12.8 oz (83.8 kg)    Physical Exam Vitals and nursing note reviewed.  HENT:     Head: Normocephalic and atraumatic.     Mouth/Throat:     Pharynx: Oropharynx is clear.  Eyes:     Extraocular Movements: Extraocular movements intact.     Pupils: Pupils are equal, round, and reactive to light.  Cardiovascular:     Rate and Rhythm: Normal rate and regular rhythm.  Pulmonary:     Comments: Decreased breath sounds bilaterally.  Abdominal:     Palpations: Abdomen is soft.  Musculoskeletal:        General: Normal range of motion.     Cervical back: Normal range of motion.  Skin:    General: Skin is warm.  Neurological:     General: No focal deficit present.     Mental Status: She is alert and oriented to person, place, and time.  Psychiatric:        Behavior: Behavior normal.        Judgment: Judgment normal.      LABORATORY DATA:  I have reviewed the data as listed Lab Results  Component Value Date   WBC 7.2 04/11/2022   HGB 11.1 (L) 04/11/2022   HCT 32.5 (L) 04/11/2022   MCV 97.3 04/11/2022   PLT 384 04/11/2022   Recent Labs    02/23/22 0814 03/02/22 1249 03/21/22 0915 04/04/22 1339 04/11/22 0857  NA 136   < > 134* 135 135  K 4.0   < > 3.7 3.5 3.9  CL 102   < > 101 96* 100  CO2 26   < > _0 GLUCOSE 145*   < > 133* 157* 149*  BUN 21   < > _1 CREATININE 0.78   < > 0.80 0.71 0.88  CALCIUM 10.1   < > 9.8 9.9 9.6  GFRNONAA >60   < > >60 >60 >60  PROT 7.5  --  7.0  --  6.8  ALBUMIN 4.2  --  3.8  --  3.7  AST 25  --  26  --  25  ALT 20  --  20  --  17  ALKPHOS 59  --  53  --  61  BILITOT 0.4  --  0.5  --  0.4   < > = values in this interval not displayed.     RADIOGRAPHIC STUDIES: I have personally reviewed the radiological images as listed and agreed with the findings in the report. No results found.  ASSESSMENT & PLAN:   No problem-specific Assessment & Plan notes  found for this encounter.  Carcinoma of overlapping sites of right breast in female, estrogen receptor positive ([place]) # Right breast cancer -[facility]; ER;PR positive G-3; LVI + T2N1.  Stage II [OCT 2023; Dr.Byrnett] s/p Lumpectomy; node positive-Oncotype RS- 35-which translates to approximately 30% risk of recurrence with endocrine therapy alone. Given high risk Oncotype-proceed with  adjuvant chemotherapy with Taxotere - Cytoxan every 3 weeks for 4 cycles. Consider abema x2 years   # Labs reviewed and acceptable for continuation of treatment. Proceed with cycle 3 of Cytoxan-taxotere chemotherapy today. She will rtc tomorrow for [facility] support.   # Neck rash- post chemo. Completed prednisone x 7 days. Thought to be secondary to taxotere. Continue daily claritin. Can consider medrol dose pack in future if needed. No recurrent rash today.   # Prediabetes-monitor closely on steroids/chemotherapy.; PBG- 149. Monitor on steroids. Encouraged dietary modifications.    # Hx of colon cancer stage III- s/p FOLFOX [2009]- residual neuropathy monitor closely on chemotherapy.   # Genetic testing- brother-& father- colon cancer; other brother- brain tumor; no breast cancers in family. We will make a referral down the line. STABLE.   # IV Access: proceed with PIV today; if not proceed with medi-port vs PICC line.   DISPOSITION:  # Taxotere cytoxan C3 today. RTC on D2 for udenyca.  # Follow up in 3 weeks for labs (cbc, cmp), Dr Rogue Bussing, C4D1 Taxotere-cytoxan with Ellen Henri on D2- la - signed in IS   All questions were answered. The patient/family knows to call the clinic with any problems, questions or concerns.  Verlon Au, NP 04/11/2022

## 2022-04-11 NOTE — Progress Notes (Signed)
Pt in for follow up, son present today as well.  Pt denies any concerns today.

## 2022-04-11 NOTE — Patient Instructions (Signed)
[facility]  Discharge Instructions: Thank you for choosing [facility] to provide your oncology and hematology care.  If you have a lab appointment with the Cancer Center, please go directly to the Cancer Center and check in at the registration area.  Wear comfortable clothing and clothing appropriate for easy access to any Portacath or PICC line.   We strive to give you quality time with your provider. You may need to reschedule your appointment if you arrive late (15 or more minutes).  Arriving late affects you and other patients whose appointments are after yours.  Also, if you miss three or more appointments without notifying the office, you may be dismissed from the clinic at the provider's discretion.      For prescription refill requests, have your pharmacy contact our office and allow 72 hours for refills to be completed.       To help prevent nausea and vomiting after your treatment, we encourage you to take your nausea medication as directed.  BELOW ARE SYMPTOMS THAT SHOULD BE REPORTED IMMEDIATELY: *FEVER GREATER THAN 100.4 F (38 C) OR HIGHER *CHILLS OR SWEATING *NAUSEA AND VOMITING THAT IS NOT CONTROLLED WITH YOUR NAUSEA MEDICATION *UNUSUAL SHORTNESS OF BREATH *UNUSUAL BRUISING OR BLEEDING *URINARY PROBLEMS (pain or burning when urinating, or frequent urination) *BOWEL PROBLEMS (unusual diarrhea, constipation, pain near the anus) TENDERNESS IN MOUTH AND THROAT WITH OR WITHOUT PRESENCE OF ULCERS (sore throat, sores in mouth, or a toothache) UNUSUAL RASH, SWELLING OR PAIN  UNUSUAL VAGINAL DISCHARGE OR ITCHING   Items with * indicate a potential emergency and should be followed up as soon as possible or go to the Emergency Department if any problems should occur.  Please show the CHEMOTHERAPY ALERT CARD or IMMUNOTHERAPY ALERT CARD at check-in to the Emergency Department and triage nurse.  Should you have questions after your  visit or need to cancel or reschedule your appointment, please contact [facility]  [phone] and follow the prompts.  Office hours are 8:00 a.m. to 4:30 p.m. Monday - Friday. Please note that voicemails left after 4:00 p.m. may not be returned until the following business day.  We are closed weekends and major holidays. You have access to a nurse at all times for urgent questions. Please call the main number to the clinic [phone] and follow the prompts.  For any non-urgent questions, you may also contact your provider using MyChart. We now offer e-Visits for anyone 18 and older to request care online for non-urgent symptoms. For details visit [email or web address].   Also download the MyChart app! Go to the app store, search "MyChart", open the app, select [facility], and log in with your MyChart username and password.  Masks are optional in the cancer centers. If you would like for your care team to wear a mask while they are taking care of you, please let them know. For doctor visits, patients may have with them one support person who is at least 79 years old. At this time, visitors are not allowed in the infusion area.   

## 2022-04-12 ENCOUNTER — Inpatient Hospital Stay: Payer: Medicare Other

## 2022-04-12 DIAGNOSIS — C50811 Malignant neoplasm of overlapping sites of right female breast: Secondary | ICD-10-CM

## 2022-04-12 DIAGNOSIS — Z170 Estrogen receptor positive status [ER+]: Secondary | ICD-10-CM | POA: Diagnosis not present

## 2022-04-12 DIAGNOSIS — Z87891 Personal history of nicotine dependence: Secondary | ICD-10-CM | POA: Diagnosis not present

## 2022-04-12 DIAGNOSIS — Z79899 Other long term (current) drug therapy: Secondary | ICD-10-CM | POA: Diagnosis not present

## 2022-04-12 DIAGNOSIS — Z9049 Acquired absence of other specified parts of digestive tract: Secondary | ICD-10-CM | POA: Diagnosis not present

## 2022-04-12 DIAGNOSIS — Z5111 Encounter for antineoplastic chemotherapy: Secondary | ICD-10-CM | POA: Diagnosis not present

## 2022-04-12 DIAGNOSIS — R7303 Prediabetes: Secondary | ICD-10-CM | POA: Diagnosis not present

## 2022-04-12 MED ORDER — PEGFILGRASTIM-CBQV 6 MG/0.6ML ~~LOC~~ SOSY
6.0000 mg | PREFILLED_SYRINGE | Freq: Once | SUBCUTANEOUS | Status: AC
  Administered 2022-04-12: 6 mg via SUBCUTANEOUS
  Filled 2022-04-12: qty 0.6

## 2022-04-25 ENCOUNTER — Encounter: Payer: Self-pay | Admitting: Internal Medicine

## 2022-04-29 MED FILL — Dexamethasone Sodium Phosphate Inj 100 MG/10ML: INTRAMUSCULAR | Qty: 1 | Status: AC

## 2022-05-02 ENCOUNTER — Inpatient Hospital Stay: Payer: Medicare Other

## 2022-05-02 ENCOUNTER — Institutional Professional Consult (permissible substitution): Payer: BC Managed Care – PPO | Admitting: Radiation Oncology

## 2022-05-02 ENCOUNTER — Encounter: Payer: Self-pay | Admitting: Internal Medicine

## 2022-05-02 ENCOUNTER — Inpatient Hospital Stay: Payer: Medicare Other | Attending: Internal Medicine

## 2022-05-02 ENCOUNTER — Inpatient Hospital Stay (HOSPITAL_BASED_OUTPATIENT_CLINIC_OR_DEPARTMENT_OTHER): Payer: Medicare Other | Admitting: Internal Medicine

## 2022-05-02 VITALS — BP 141/74 | HR 86 | Temp 95.9°F | Resp 18 | Wt 182.6 lb

## 2022-05-02 DIAGNOSIS — Z5111 Encounter for antineoplastic chemotherapy: Secondary | ICD-10-CM | POA: Insufficient documentation

## 2022-05-02 DIAGNOSIS — U071 COVID-19: Secondary | ICD-10-CM | POA: Diagnosis not present

## 2022-05-02 DIAGNOSIS — Z5189 Encounter for other specified aftercare: Secondary | ICD-10-CM | POA: Insufficient documentation

## 2022-05-02 DIAGNOSIS — Z170 Estrogen receptor positive status [ER+]: Secondary | ICD-10-CM | POA: Diagnosis not present

## 2022-05-02 DIAGNOSIS — Z79899 Other long term (current) drug therapy: Secondary | ICD-10-CM | POA: Insufficient documentation

## 2022-05-02 DIAGNOSIS — C50811 Malignant neoplasm of overlapping sites of right female breast: Secondary | ICD-10-CM | POA: Diagnosis not present

## 2022-05-02 LAB — CBC WITH DIFFERENTIAL/PLATELET
Abs Immature Granulocytes: 0.06 10*3/uL (ref 0.00–0.07)
Basophils Absolute: 0 10*3/uL (ref 0.0–0.1)
Basophils Relative: 0 %
Eosinophils Absolute: 0 10*3/uL (ref 0.0–0.5)
Eosinophils Relative: 0 %
HCT: 33 % — ABNORMAL LOW (ref 36.0–46.0)
Hemoglobin: 11.2 g/dL — ABNORMAL LOW (ref 12.0–15.0)
Immature Granulocytes: 1 %
Lymphocytes Relative: 21 %
Lymphs Abs: 1.7 10*3/uL (ref 0.7–4.0)
MCH: 33.1 pg (ref 26.0–34.0)
MCHC: 33.9 g/dL (ref 30.0–36.0)
MCV: 97.6 fL (ref 80.0–100.0)
Monocytes Absolute: 0.8 10*3/uL (ref 0.1–1.0)
Monocytes Relative: 9 %
Neutro Abs: 5.6 10*3/uL (ref 1.7–7.7)
Neutrophils Relative %: 69 %
Platelets: 387 10*3/uL (ref 150–400)
RBC: 3.38 MIL/uL — ABNORMAL LOW (ref 3.87–5.11)
RDW: 14.8 % (ref 11.5–15.5)
WBC: 8.2 10*3/uL (ref 4.0–10.5)
nRBC: 0 % (ref 0.0–0.2)

## 2022-05-02 LAB — COMPREHENSIVE METABOLIC PANEL
ALT: 12 U/L (ref 0–44)
AST: 31 U/L (ref 15–41)
Albumin: 3.7 g/dL (ref 3.5–5.0)
Alkaline Phosphatase: 63 U/L (ref 38–126)
Anion gap: 10 (ref 5–15)
BUN: 22 mg/dL (ref 8–23)
CO2: 26 mmol/L (ref 22–32)
Calcium: 9.9 mg/dL (ref 8.9–10.3)
Chloride: 97 mmol/L — ABNORMAL LOW (ref 98–111)
Creatinine, Ser: 0.9 mg/dL (ref 0.44–1.00)
GFR, Estimated: >60 mL/min (ref >60)
Glucose, Bld: 142 mg/dL — ABNORMAL HIGH (ref 70–99)
Potassium: 3.6 mmol/L (ref 3.5–5.1)
Sodium: 133 mmol/L — ABNORMAL LOW (ref 135–145)
Total Bilirubin: 0.5 mg/dL (ref 0.3–1.2)
Total Protein: 6.7 g/dL (ref 6.5–8.1)

## 2022-05-02 MED ORDER — SODIUM CHLORIDE 0.9 % IV SOLN
75.0000 mg/m2 | Freq: Once | INTRAVENOUS | Status: AC
  Administered 2022-05-02: 150 mg via INTRAVENOUS
  Filled 2022-05-02: qty 15

## 2022-05-02 MED ORDER — SODIUM CHLORIDE 0.9 % IV SOLN
Freq: Once | INTRAVENOUS | Status: AC
  Filled 2022-05-02: qty 250

## 2022-05-02 MED ORDER — SODIUM CHLORIDE 0.9 % IV SOLN
10.0000 mg | Freq: Once | INTRAVENOUS | Status: AC
  Administered 2022-05-02: 10 mg via INTRAVENOUS
  Filled 2022-05-02: qty 10

## 2022-05-02 MED ORDER — SODIUM CHLORIDE 0.9 % IV SOLN
600.0000 mg/m2 | Freq: Once | INTRAVENOUS | Status: AC
  Administered 2022-05-02: 1200 mg via INTRAVENOUS
  Filled 2022-05-02: qty 60

## 2022-05-02 MED ORDER — PALONOSETRON HCL INJECTION 0.25 MG/5ML
0.2500 mg | Freq: Once | INTRAVENOUS | Status: AC
  Administered 2022-05-02: 0.25 mg via INTRAVENOUS
  Filled 2022-05-02: qty 5

## 2022-05-02 NOTE — Progress Notes (Signed)
[facility] NOTE  Patient Care Team: Leone Haven, MD as PCP - General (Family Medicine) Kate Sable, MD as PCP - Cardiology (Cardiology) Bary Castilla Forest Gleason, MD as Consulting Physician (General Surgery) Dallas Schimke, MD (Internal Medicine) Daiva Huge, RN as Oncology Nurse Navigator  CHIEF COMPLAINTS/PURPOSE OF CONSULTATION: Breast cancer  #  Oncology History Overview Note  # 2009- Sigmoid colon cancer stage II (T3 N0 4 lymph nodes sampled M0 stage II high risk there was obstruction and perforation abscess; Dr.Hern), workup included a low CEA preoperatively and a chest x-ray and CT scan abdomen pelvis negative for metastatic disease, S/P colostomy; s/p FOLFOX x 6 months. [Dr.Gittin ]  # LATER REVERSED , K RAS  wild type B RAF not evaluated   Also initial iron deficiency with anemia thrombocytosis, high platelets considered reactive, workup includes a normal PCR for CML and a negative [id number] mutation  DIAGNOSIS:  A. BREAST, RIGHT; LUMPECTOMY:  - INVASIVE MAMMARY CARCINOMA.  - DUCTAL CARCINOMA IN SITU (DCIS).  - SEE CANCER SUMMARY BELOW.  - BIOPSY SITE CHANGE WITH CLIP (2).  - FIBROUS TISSUE WITH CYST FORMATION AND FLORID DUCTAL HYPERPLASIA.  - SKIN AND NIPPLE NEGATIVE FOR MALIGNANCY.  - VASCULAR CALCIFICATION.   B. SENTINEL LYMPH NODES 1 AND 2, RIGHT AXILLA; EXCISION:  - METASTATIC CARCINOMA INVOLVES ONE OF TWO LYMPH NODES (1/2).   C. NON-SENTINEL LYMPH NODES, RIGHT AXILLA; EXCISION:  - ONE LYMPH NODE NEGATIVE FOR MALIGNANCY (0/1).   CANCER CASE SUMMARY: INVASIVE CARCINOMA OF THE BREAST  Standard(s): AJCC-UICC 8   SPECIMEN  Procedure: Lumpectomy  Specimen Laterality: Right   TUMOR  Histologic Type: Invasive carcinoma of no special type (ductal)  Histologic Grade (Nottingham Histologic Score)       Glandular (Acinar)/Tubular Differentiation: 3       Nuclear Pleomorphism: 3       Mitotic Rate: 3       Overall Grade: 3  Tumor Size:  24 mm  Tumor Focality: Single focus of invasive carcinoma  Ductal Carcinoma In Situ (DCIS): Present, nuclear grade 2-3  Tumor Extent: Not applicable  Lymphatic and/or Vascular Invasion: Present  Treatment Effect in the Breast: No known presurgical therapy   MARGINS  Margin Status for Invasive Carcinoma: All margins negative for invasive  carcinoma       Distance from closest margin: 10 mm       Specify closest margin: Superior   Margin Status for DCIS: All margins negative for DCIS       Distance from DCIS to closest margin: 10 mm       Specify closest margin: Superior   REGIONAL LYMPH NODES  Regional Lymph Node Status: Tumor present in regional lymph node(s)       Number of Lymph Nodes with Macrometastases (greater than 2 mm): 1       Number of Lymph Nodes with Micrometastases (greater than 0.2 mm to  2 mm and/or greater than 200 cells): 0       Number of Lymph Nodes with Isolated Tumor Cells (0.2 mm or less OR  200 cells or less): 0       Size of Largest Metastatic Deposit: 3 mm      Extranodal Extension: Not identified       Total Number of Lymph Nodes Examined (sentinel and non-sentinel): 3       Number of Sentinel Nodes Examined: 2   DISTANT METASTASIS  Distant Site(s) Involved, if applicable: Not applicable  PATHOLOGIC STAGE CLASSIFICATION (pTNM, AJCC 8th Edition):  Modified Classification: Not applicable  pT Category: pT2  T Suffix: Not applicable  pN Category: pN1a  N Suffix: (sn)  pM Category: Not applicable   SPECIAL STUDIES  Breast Biomarker Testing Performed on Previous Outside Biopsy:  23-250-T11  Per outside report:  Estrogen Receptor (ER) Status: POSITIVE          Percentage of cells with nuclear positivity: Greater than 90%          Average intensity of staining: Strong   Progesterone Receptor (PgR) Status: POSITIVE          Percentage of cells with nuclear positivity: 60%          Average intensity of staining: Strong   HER2 (by  immunohistochemistry): EQUIVOCAL (Score 2+)  HER2 FISH: NEGATIVE   Ki-67: Not performed   IMPRESSION: 1. Complex cystic and solid-appearing mass within the subareolar RIGHT breast, overall measuring 3.1 cm greatest dimension, the solid enhancing component at the anterior-lateral aspect of the mass measuring 2 x 1 cm, abutting the overlying skin but without evidence of involvement/enhancement of the skin. Susceptibility artifact within the mass may represent a biopsy clip (if biopsy has been performed at an outside site since the diagnostic mammogram/ultrasound of 12/28/2021) or this artifact could represent calcifications within the mass. This mass corresponds to the recent ultrasound finding of a complex irregular mass at the 11 o'clock axis of the retroareolar RIGHT breast. 2. No evidence of multifocal or multicentric disease within the RIGHT breast. 3. No evidence of contralateral disease in the LEFT breast.   RECOMMENDATION: If has not already been performed, recommend ultrasound-guided biopsy of the suspicious mass in the subareolar RIGHT breast, with preferential targeting of the anterior-lateral component of the mass which is shown to be the enhancing component on today's MRI.   BI-RADS CATEGORY  4: Suspicious.     Cancer of sigmoid ([place]) (Resolved)  Carcinoma of overlapping sites of right breast in female, estrogen receptor positive ([place])  02/14/2022 Initial Diagnosis   Carcinoma of overlapping sites of right breast in female, estrogen receptor positive ([place])   02/14/2022 Cancer Staging   Staging form: Breast, AJCC 8th Edition - Pathologic: Stage IIA (pT2, pN1, cM0, G3, ER+, PR+, HER2-) - Signed by Cammie Sickle, MD on 02/14/2022 Histologic grading system: 3 grade system   02/23/2022 -  Chemotherapy   Patient is on Treatment Plan : BREAST TC q21d        HISTORY OF PRESENTING ILLNESS:   with DIL. Ambulating independently.   Sabrina Wilson 82 y.o.   female patient with triple negative breast cancer stage II -currently on adjuvant chemotherapy with Taxotere + Cytoxan is here for follow-up.  Patient currently status post 3 cycles of chemotherapy. Patient's cycle #1 chemotherapy was complicated by skin rash.  Treated with antihistamine and steroids.  Resolved.  Patient states she is having some shortness of breathe. Patient states she is also having some mucus.   Patient also had myalgias/bone pain posttreatment improved with Claritin currently resolved.  Patient has mild residual neuropathy from her previous chemotherapy from oxaliplatin.  However, no falls.   Review of Systems  Constitutional:  Negative for chills, diaphoresis, fever, malaise/fatigue and weight loss.  HENT:  Negative for nosebleeds and sore throat.   Eyes:  Negative for double vision.  Respiratory:  Negative for cough, hemoptysis, sputum production, shortness of breath and wheezing.   Cardiovascular:  Negative for chest pain, palpitations, orthopnea and leg  swelling.  Gastrointestinal:  Negative for abdominal pain, blood in stool, constipation, diarrhea, heartburn, melena, nausea and vomiting.  Genitourinary:  Negative for dysuria, frequency and urgency.  Musculoskeletal:  Negative for back pain and joint pain.  Skin: Negative.  Negative for itching and rash.  Neurological:  Negative for dizziness, tingling, focal weakness, weakness and headaches.  Endo/Heme/Allergies:  Does not bruise/bleed easily.  Psychiatric/Behavioral:  Negative for depression. The patient is not nervous/anxious and does not have insomnia.      MEDICAL HISTORY:  Past Medical History:  Diagnosis Date   Actinic keratosis 02/12/2020   R lat calf (hypertrophic)   Arthritis    Cancer of sigmoid ([place]) 06/17/2016   Colon cancer ([place]) 2009   T3,N0; s/p resection  Dr. Hulda Humphrey and chemotherapy by Dr. Cynda Acres   Diastolic dysfunction    a. 08/2020 Echo: EF 60-65%, no rwma, Gr1 DD, nl RV size/fxn. Mild MR.    Hernia of flank    History of actinic keratoses 08/11/2020   Bx proven at left lateral ankle proximal, LN2 10/08/20   History of stress test    a. 09/2020 MV: EF >65%. No ischemia/infarct. Mild Ao Ca2+ and minimal Cor Ca2+.   Hypertension    Neuropathy    Polyp at cervical os    s/p resection Dr. Sabra Heck   Skin cancer 01/22/2020   surface of an atypical verrucous squamous proliferation    Squamous cell carcinoma of skin 07/16/2020   Left lateral ankle anterior, [facility] with [facility] 08-11-2020    SURGICAL HISTORY: Past Surgical History:  Procedure Laterality Date   BREAST BIOPSY Right 05/2014   Dr. Dwyane Luo office-benign   BREAST LUMPECTOMY WITH SENTINEL LYMPH NODE BIOPSY Right 01/21/2022   Procedure: BREAST LUMPECTOMY WITH SENTINEL LYMPH NODE BX;  Surgeon: Robert Bellow, MD;  Location: [facility] ORS;  Service: General;  Laterality: Right;   BREAST SURGERY Right February 2016   Vacuum assisted biopsy for recurrent cyst, fibrocystic changes without evidence of malignancy.   CATARACT EXTRACTION W/PHACO Left 01/13/2015   Procedure: CATARACT EXTRACTION PHACO AND INTRAOCULAR LENS PLACEMENT (IOC);  Surgeon: Birder Robson, MD;  Location: [facility] ORS;  Service: Ophthalmology;  Laterality: Left;  [place]  1:02.9 AP   19.2 CDE  12.06 casette lot #  [id number]   CATARACT EXTRACTION W/PHACO Right 02/03/2015   Procedure: CATARACT EXTRACTION PHACO AND INTRAOCULAR LENS PLACEMENT (IOC);  Surgeon: Birder Robson, MD;  Location: [facility] ORS;  Service: Ophthalmology;  Laterality: Right;  us00:53 ap46.5 [id number]   COLECTOMY     COLONOSCOPY  2015   Dr Candace Cruise   COLONOSCOPY WITH PROPOFOL N/A 09/13/2017   Procedure: COLONOSCOPY WITH PROPOFOL;  Surgeon: Robert Bellow, MD;  [facility];  Service: Endoscopy;  Laterality: N/A;   COLONOSCOPY WITH PROPOFOL N/A 11/08/2017   Procedure: COLONOSCOPY WITH PROPOFOL;  Surgeon: Robert Bellow, MD;  Location: [facility] ENDOSCOPY;  Service: Endoscopy;  Laterality: N/A;    colostomy reversal     DILATION AND CURETTAGE OF UTERUS  2014   Dr. Sabra Heck, benign per pt   EYE SURGERY     HERNIA REPAIR  07/13/12   ventral    SKIN CANCER EXCISION     TONSILLECTOMY      SOCIAL HISTORY: Social History   Socioeconomic History   Marital status: Married    Spouse name: Not on file   Number of children: Not on file   Years of education: Not on file   Highest education level: Not on file  Occupational History  Not on file  Tobacco Use   Smoking status: Former    Types: Cigarettes    Quit date: 10/12/2007    Years since quitting: 14.5   Smokeless tobacco: Never  Vaping Use   Vaping Use: Never used  Substance and Sexual Activity   Alcohol use: Yes    Alcohol/week: 1.0 standard drink of alcohol    Types: 1 Standard drinks or equivalent per week    Comment: with dinner GLASS OF WINE EACH DAY   Drug use: No   Sexual activity: Not on file  Other Topics Concern   Not on file  Social History Narrative   Lives in [place] with husband. 2 children, son lives nearby.Diet - regularExercise - none. 30 mins/in [place]. Quit smoking in 2009. Wine before dinner. Pianist in church; real estate; Adult nurse.    Social Determinants of Health   Financial Resource Strain: Low Risk  (11/13/2018)   Overall Financial Resource Strain (CARDIA)    Difficulty of Paying Living Expenses: Not hard at all  Food Insecurity: No Food Insecurity (11/16/2020)   Hunger Vital Sign    Worried About Running Out of Food in the Last Year: Never true    Ran Out of Food in the Last Year: Never true  Transportation Needs: No Transportation Needs (11/16/2020)   PRAPARE - Hydrologist (Medical): No    Lack of Transportation (Non-Medical): No  Physical Activity: Not on file  Stress: No Stress Concern Present (11/16/2020)   [place]    Feeling of Stress : Not at all  Social Connections: Not on file   Intimate Partner Violence: Not At Risk (11/16/2020)   Humiliation, Afraid, Rape, and Kick questionnaire    Fear of Current or Ex-Partner: No    Emotionally Abused: No    Physically Abused: No    Sexually Abused: No    FAMILY HISTORY: Family History  Problem Relation Age of Onset   Stroke Mother    Diabetes Mother    Cancer Father        colon   Stroke Sister    Cancer Brother        colon   Cancer Brother        brain    ALLERGIES:  is allergic to sulfa antibiotics.  MEDICATIONS:  Current Outpatient Medications  Medication Sig Dispense Refill   atorvastatin (LIPITOR) 10 MG tablet Take 1 tablet (10 mg total) by mouth daily. 90 tablet 1   Calcium Carbonate-Vit D-Min (CALCIUM 1200 PO) Take by mouth daily.     clobetasol cream (TEMOVATE) 0.05 % Apply twice daily to affected areas with rash up to 2 weeks. Avoid applying to face, groin, and axilla. 60 g 1   clobetasol ointment (TEMOVATE) 7.82 % Apply 1 application topically 2 (two) times daily. To affected areas of rash for up to 2-3 weeks. Avoid applying to face, groin, and axilla. Use as directed. Long-term use can cause thinning of the skin. 45 g 1   hydrochlorothiazide (HYDRODIURIL) 12.5 MG tablet TAKE ONE TABLET BY MOUTH DAILY 90 tablet 1   niacinamide 500 MG tablet Take 500 mg by mouth 2 (two) times daily.     Omega-3 Fatty Acids (FISH OIL PO) Take by mouth daily.     Ruxolitinib Phosphate (OPZELURA) 1.5 % CREA Apply 1 application topically daily. 60 g 2   VITAMIN D, CHOLECALCIFEROL, PO Take 2,000 Units by mouth daily.  ondansetron (ZOFRAN) 8 MG tablet One pill every 8 hours as needed for nausea/vomitting. (Patient not taking: Reported on 03/02/2022) 40 tablet 1   prochlorperazine (COMPAZINE) 10 MG tablet Take 1 tablet (10 mg total) by mouth every 6 (six) hours as needed for nausea or vomiting. (Patient not taking: Reported on 03/02/2022) 40 tablet 1   sodium fluoride (FLUORISHIELD) 1.1 % GEL dental gel Place onto teeth.  (Patient not taking: Reported on 05/02/2022)     No current facility-administered medications for this visit.      Marland Kitchen  PHYSICAL EXAMINATION:   Vitals:   05/02/22 0917  BP: (!) 141/74  Pulse: 86  Resp: 18  Temp: (!) 95.9 F (35.5 C)  SpO2: 98%   Filed Weights   05/02/22 0917  Weight: 182 lb 9.6 oz (82.8 kg)    Physical Exam Vitals and nursing note reviewed.  HENT:     Head: Normocephalic and atraumatic.     Mouth/Throat:     Pharynx: Oropharynx is clear.  Eyes:     Extraocular Movements: Extraocular movements intact.     Pupils: Pupils are equal, round, and reactive to light.  Cardiovascular:     Rate and Rhythm: Normal rate and regular rhythm.  Pulmonary:     Comments: Decreased breath sounds bilaterally.  Abdominal:     Palpations: Abdomen is soft.  Musculoskeletal:        General: Normal range of motion.     Cervical back: Normal range of motion.  Skin:    General: Skin is warm.  Neurological:     General: No focal deficit present.     Mental Status: She is alert and oriented to person, place, and time.  Psychiatric:        Behavior: Behavior normal.        Judgment: Judgment normal.      LABORATORY DATA:  I have reviewed the data as listed Lab Results  Component Value Date   WBC 8.2 05/02/2022   HGB 11.2 (L) 05/02/2022   HCT 33.0 (L) 05/02/2022   MCV 97.6 05/02/2022   PLT 387 05/02/2022   Recent Labs    03/21/22 0915 04/04/22 1339 04/11/22 0857 05/02/22 0902  NA 134* 135 135 133*  K 3.7 3.5 3.9 3.6  CL 101 96* 100 97*  CO2 '27 29 24 26  '$ GLUCOSE 133* 157* 149* 142*  BUN '21 17 22 22  '$ CREATININE 0.80 0.71 0.88 0.90  CALCIUM 9.8 9.9 9.6 9.9  GFRNONAA >60 >60 >60 >60  PROT 7.0  --  6.8 PENDING  ALBUMIN 3.8  --  3.7 3.7  AST 26  --  25 31  ALT 20  --  17 12  ALKPHOS 53  --  61 63  BILITOT 0.5  --  0.4 0.5    RADIOGRAPHIC STUDIES: I have personally reviewed the radiological images as listed and agreed with the findings in the  report. No results found.  ASSESSMENT & PLAN:   Carcinoma of overlapping sites of right breast in female, estrogen receptor positive ([place]) # Right breast cancer -[facility]; ER;PR positive G-3; LVI + T2N1.  Stage II [OCT 2023; Dr.Byrnett] s/p Lumpectomy; node positive-Oncotype RS- 35-which translates to approximately 30% risk of recurrence with endocrine therapy alone. Given high risk Oncotype-proceed with adjuvant chemotherapy with Taxotere-Cytoxan every 3 weeks for 4 cycles. Consider abema x2 years   # Proceed with Taxotere -Cytoxan cycle #4  today. Labs today reviewed;  acceptable for treatment today.    #  Skin rash around neck- post chemo-s/p prednisone x 7 days [after cycle #1]-  likely secondary to Taxotere- improved.   # Hx of Smoking- ? COPD/ chronic bronchitis- recommend mucinex-   # Prediabetes-monitor closely on steroids/chemotherapy.; PBG- 133- monitor for now on steroids.    # Hx of colon cancer stage III- s/p FOLFOX [2009]-residual neuropathy monitor closely on chemotherapy.   # Genetic testing: brother-& father- colon cancer; other brother- brain tumor; no breast cancers in family. We will make a referral down the line. STABLE.   # IV Access: proceed with PIV today; if not proceed with medi-port vs PICC line.   # DISPOSITION: # chemo today; undenyca- on 11/29.  # follow up in 3 weeks- MD: labs- cbc/cmp; Taxoeter-Cytoxan; D-2- Margart Sickles- Dr.B    All questions were answered. The patient/family knows to call the clinic with any problems, questions or concerns.       Cammie Sickle, MD 05/02/2022 10:11 AM

## 2022-05-02 NOTE — Progress Notes (Signed)
Patient states she is having some shortness of breathe. Patient states she is also having some mucus.

## 2022-05-02 NOTE — Assessment & Plan Note (Addendum)
#   Right breast cancer -[facility]; ER;PR positive G-3; LVI + T2N1.  Stage II [OCT 2023; Dr.Byrnett] s/p Lumpectomy; node positive-Oncotype RS- 35-which translates to approximately 30% risk of recurrence with endocrine therapy alone. Given high risk Oncotype-proceed with adjuvant chemotherapy with Taxotere-Cytoxan every 3 weeks for 4 cycles. Consider abema x2 years   # Proceed with Taxotere -Cytoxan cycle #4  today. Labs today reviewed;  acceptable for treatment today.    # Skin rash around neck- post chemo-s/p prednisone x 7 days [after cycle #1]-  likely secondary to Taxotere- improved/resolved.   # Hx of Smoking- ? COPD/ chronic bronchitis- Recommend mucinex.   # Prediabetes-monitor closely on steroids/chemotherapy.; PBG- 140s- monitor for now on steroids.    # Hx of colon cancer stage III- s/p FOLFOX [2009]-residual neuropathy monitor closely on chemotherapy.   # Genetic testing: brother-& father- colon cancer; other brother- brain tumor; no breast cancers in family. We will make a referral down the line. STABLE.   # IV Access: proceed with PIV today; if not proceed with medi-port vs PICC line.   # DISPOSITION: # chemo today; undenyca- as ordered.  # referral to Dr.Chrystal re: breast cancer # follow up labs- 1 week- APP; labs- cbc/bmp; possible fluids-  # follow up in 4  weeks- MD: labs- cbc/cmp; no chemo- Dr.B

## 2022-05-03 ENCOUNTER — Inpatient Hospital Stay: Payer: Medicare Other

## 2022-05-03 DIAGNOSIS — U071 COVID-19: Secondary | ICD-10-CM | POA: Diagnosis not present

## 2022-05-03 DIAGNOSIS — Z79899 Other long term (current) drug therapy: Secondary | ICD-10-CM | POA: Diagnosis not present

## 2022-05-03 DIAGNOSIS — C50811 Malignant neoplasm of overlapping sites of right female breast: Secondary | ICD-10-CM | POA: Diagnosis not present

## 2022-05-03 DIAGNOSIS — Z5189 Encounter for other specified aftercare: Secondary | ICD-10-CM | POA: Diagnosis not present

## 2022-05-03 DIAGNOSIS — Z170 Estrogen receptor positive status [ER+]: Secondary | ICD-10-CM | POA: Diagnosis not present

## 2022-05-03 DIAGNOSIS — Z5111 Encounter for antineoplastic chemotherapy: Secondary | ICD-10-CM | POA: Diagnosis not present

## 2022-05-03 MED ORDER — PEGFILGRASTIM-CBQV 6 MG/0.6ML ~~LOC~~ SOSY
6.0000 mg | PREFILLED_SYRINGE | Freq: Once | SUBCUTANEOUS | Status: AC
  Administered 2022-05-03: 6 mg via SUBCUTANEOUS
  Filled 2022-05-03: qty 0.6

## 2022-05-05 ENCOUNTER — Encounter: Payer: Self-pay | Admitting: Radiation Oncology

## 2022-05-05 ENCOUNTER — Ambulatory Visit
Admission: RE | Admit: 2022-05-05 | Discharge: 2022-05-05 | Disposition: A | Discharge status: Home / Self Care | Payer: BC Managed Care – PPO | Source: Ambulatory Visit | Attending: Radiation Oncology | Admitting: Radiation Oncology

## 2022-05-05 VITALS — BP 140/78 | HR 99 | Temp 99.0°F | Resp 20 | Wt 180.2 lb

## 2022-05-05 DIAGNOSIS — Z85828 Personal history of other malignant neoplasm of skin: Secondary | ICD-10-CM | POA: Insufficient documentation

## 2022-05-05 DIAGNOSIS — Z87891 Personal history of nicotine dependence: Secondary | ICD-10-CM | POA: Insufficient documentation

## 2022-05-05 DIAGNOSIS — Z79899 Other long term (current) drug therapy: Secondary | ICD-10-CM | POA: Insufficient documentation

## 2022-05-05 DIAGNOSIS — Z809 Family history of malignant neoplasm, unspecified: Secondary | ICD-10-CM | POA: Insufficient documentation

## 2022-05-05 DIAGNOSIS — Z170 Estrogen receptor positive status [ER+]: Secondary | ICD-10-CM | POA: Diagnosis not present

## 2022-05-05 DIAGNOSIS — Z85038 Personal history of other malignant neoplasm of large intestine: Secondary | ICD-10-CM | POA: Insufficient documentation

## 2022-05-05 DIAGNOSIS — I10 Essential (primary) hypertension: Secondary | ICD-10-CM | POA: Insufficient documentation

## 2022-05-05 DIAGNOSIS — K449 Diaphragmatic hernia without obstruction or gangrene: Secondary | ICD-10-CM | POA: Diagnosis not present

## 2022-05-05 DIAGNOSIS — C50811 Malignant neoplasm of overlapping sites of right female breast: Secondary | ICD-10-CM | POA: Diagnosis not present

## 2022-05-05 NOTE — Consult Note (Signed)
NEW PATIENT EVALUATION  Name: Sabrina Wilson  MRN: [id number]  Date:   05/05/2022     DOB: Feb 24, 1941   This 82 y.o. female patient presents to the clinic for initial evaluation of stage IIb ([id number]) ER/PR positive HER2 not overexpressed invasive mammary carcinoma of the right breast..  REFERRING PHYSICIAN: Leone Haven, MD  CHIEF COMPLAINT:  Chief Complaint  Patient presents with   Breast Cancer    DIAGNOSIS: The encounter diagnosis was Carcinoma of overlapping sites of right breast in female, estrogen receptor positive ([place]).   PREVIOUS INVESTIGATIONS:  Mammogram ultrasound and MRI scans reviewed Clinical notes reviewed Pathology report reviewed  HPI: Patient is a 82 year old female who presented to self palpated mass in the right breast.  Member Phillip Heal confirmed a 2.1 cm mass at the site of palpable concern in the right breast 11 o'clock position in the retroareolar region which abuts but do not definitively invade the skin.  Ultrasound the right axilla this demonstrated normal lymph nodes.  She underwent ultrasound-guided biopsy which was positive for invasive mammary carcinoma.  MRI scan showed complex cystic and solid appearing mass in the subareolar right breast measuring over 3.1 cm.  No evidence of multifocal or multicentric disease with the right breast was noted.  She went on to a wide local excision for a 2.4 cm overall grade 3 invasive mammary carcinoma with lymphatic and vascular invasion present.  Margins were clear at 1 cm.  3 lymph nodes were examined to her sentinel nodes 1 had a macro metastatic site of 3 mm.  No extranodal extension was identified.  Her Oncotype DX showed a high risk of recurrence and she is undergone 4 cycles of TC.  She is tolerated that fairly well.  She does have a prior history of sigmoid colon cancer stage II who also has status post colostomy with FOLFOX x 6 months.  She is seen today for radiation oncology opinion.  She is still quite  fatigued from her chemotherapy specifically denies breast tenderness cough or bone pain.  PLANNED TREATMENT REGIMEN: Right whole breast and peripheral lymphatic radiation  PAST MEDICAL HISTORY:  has a past medical history of Actinic keratosis (02/12/2020), Arthritis, Cancer of sigmoid ([place]) (06/17/2016), Colon cancer ([place]) (6440), Diastolic dysfunction, Hernia of flank, History of actinic keratoses (08/11/2020), History of stress test, Hypertension, Neuropathy, Polyp at cervical os, Skin cancer (01/22/2020), and Squamous cell carcinoma of skin (07/16/2020).    PAST SURGICAL HISTORY:  Past Surgical History:  Procedure Laterality Date   BREAST BIOPSY Right 05/2014   Dr. Dwyane Luo office-benign   BREAST LUMPECTOMY WITH SENTINEL LYMPH NODE BIOPSY Right 01/21/2022   Procedure: BREAST LUMPECTOMY WITH SENTINEL LYMPH NODE BX;  Surgeon: Robert Bellow, MD;  Location: [facility] OR[facility];  Service: General;  Laterality: Right;   BREAST SURGERY Right February 2016   Vacuum assisted biopsy for recurrent cyst, fibrocystic changes without evidence of malignancy.   CATARACT EXTRACTION W/PHACO Left 01/13/2015   Procedure: CATARACT EXTRACTION PHACO AND INTRAOCULAR LENS PLACEMENT (IOC);  Surgeon: Birder Robson, MD;  Location: [facility];  Service: Ophthalmology;  Laterality: Left;  [place]  1:02.9 AP   19.2 CDE  12.06 casette lot #  [id number]   CATARACT EXTRACTION W/PHACO Right 02/03/2015   Procedure: CATARACT EXTRACTION PHACO AND INTRAOCULAR LENS PLACEMENT (IOC);  Surgeon: Birder Robson, MD;  Location: [facility];  Service: Ophthalmology;  Laterality: Right;  [id number] ap46.5 [id number]   COLECTOMY     COLONOSCOPY  2015   Dr Candace Cruise  COLONOSCOPY WITH PROPOFOL N/A 09/13/2017   Procedure: COLONOSCOPY WITH PROPOFOL;  Surgeon: Robert Bellow, MD;  Location: [facility];  Service: Endoscopy;  Laterality: N/A;   COLONOSCOPY WITH PROPOFOL N/A 11/08/2017   Procedure: COLONOSCOPY WITH PROPOFOL;  Surgeon: Robert Bellow, MD;   Location: [facility];  Service: Endoscopy;  Laterality: N/A;   colostomy reversal     DILATION AND CURETTAGE OF UTERUS  2014   Dr. Sabra Heck, benign per pt   EYE SURGERY     HERNIA REPAIR  07/13/12   ventral    SKIN CANCER EXCISION     TONSILLECTOMY      FAMILY HISTORY: family history includes Cancer in her brother, brother, and father; Diabetes in her mother; Stroke in her mother and sister.  SOCIAL HISTORY:  reports that she quit smoking about 14 years ago. Her smoking use included cigarettes. She has never used smokeless tobacco. She reports current alcohol use of about 1.0 standard drink of alcohol per week. She reports that she does not use drugs.  ALLERGIES: Sulfa antibiotics  MEDICATIONS:  Current Outpatient Medications  Medication Sig Dispense Refill   atorvastatin (LIPITOR) 10 MG tablet Take 1 tablet (10 mg total) by mouth daily. 90 tablet 1   Calcium Carbonate-Vit D-Min (CALCIUM 1200 PO) Take by mouth daily.     clobetasol cream (TEMOVATE) 0.05 % Apply twice daily to affected areas with rash up to 2 weeks. Avoid applying to face, groin, and axilla. 60 g 1   clobetasol ointment (TEMOVATE) 2.02 % Apply 1 application topically 2 (two) times daily. To affected areas of rash for up to 2-3 weeks. Avoid applying to face, groin, and axilla. Use as directed. Long-term use can cause thinning of the skin. 45 g 1   hydrochlorothiazide (HYDRODIURIL) 12.5 MG tablet TAKE ONE TABLET BY MOUTH DAILY 90 tablet 1   niacinamide 500 MG tablet Take 500 mg by mouth 2 (two) times daily.     Omega-3 Fatty Acids (FISH OIL PO) Take by mouth daily.     Ruxolitinib Phosphate (OPZELURA) 1.5 % CREA Apply 1 application topically daily. 60 g 2   VITAMIN D, CHOLECALCIFEROL, PO Take 2,000 Units by mouth daily.     ondansetron (ZOFRAN) 8 MG tablet One pill every 8 hours as needed for nausea/vomitting. (Patient not taking: Reported on 03/02/2022) 40 tablet 1   prochlorperazine (COMPAZINE) 10 MG tablet Take 1 tablet  (10 mg total) by mouth every 6 (six) hours as needed for nausea or vomiting. (Patient not taking: Reported on 03/02/2022) 40 tablet 1   sodium fluoride (FLUORISHIELD) 1.1 % GEL dental gel Place onto teeth. (Patient not taking: Reported on 05/02/2022)     No current facility-administered medications for this encounter.    ECOG PERFORMANCE STATUS:    REVIEW OF SYSTEMS: Patient has previous history of colon cancer status post colostomy and adjuvant FOLFOX chemotherapy Patient denies any weight loss, fatigue, weakness, fever, chills or night sweats. Patient denies any loss of vision, blurred vision. Patient denies any ringing  of the ears or hearing loss. No irregular heartbeat. Patient denies heart murmur or history of fainting. Patient denies any chest pain or pain radiating to her upper extremities. Patient denies any shortness of breath, difficulty breathing at night, cough or hemoptysis. Patient denies any swelling in the lower legs. Patient denies any nausea vomiting, vomiting of blood, or coffee ground material in the vomitus. Patient denies any stomach pain. Patient states has had normal bowel movements no significant constipation or  diarrhea. Patient denies any dysuria, hematuria or significant nocturia. Patient denies any problems walking, swelling in the joints or loss of balance. Patient denies any skin changes, loss of hair or loss of weight. Patient denies any excessive worrying or anxiety or significant depression. Patient denies any problems with insomnia. Patient denies excessive thirst, polyuria, polydipsia. Patient denies any swollen glands, patient denies easy bruising or easy bleeding. Patient denies any recent infections, allergies or URI. Patient "s visual fields have not changed significantly in recent time.   PHYSICAL EXAM: BP (!) 140/78 (BP Location: Left Arm, Patient Position: Sitting, Cuff Size: Normal)   Pulse 99   Temp 99 F (37.2 C) (Tympanic)   Resp 20   Wt 180 lb 3.2 oz  (81.7 kg)   BMI 28.65 kg/m  No dominant masses noted in either breast due to positions examined.  No axillary or supraclavicular adenopathy is identified.  She does have a functioning colostomy.  Well-developed well-nourished patient in NAD. HEENT reveals PERLA, EOMI, discs not visualized.  Oral cavity is clear. No oral mucosal lesions are identified. Neck is clear without evidence of cervical or supraclavicular adenopathy. Lungs are clear to A&P. Cardiac examination is essentially unremarkable with regular rate and rhythm without murmur rub or thrill. Abdomen is benign with no organomegaly or masses noted. Motor sensory and DTR levels are equal and symmetric in the upper and lower extremities. Cranial nerves II through XII are grossly intact. Proprioception is intact. No peripheral adenopathy or edema is identified. No motor or sensory levels are noted. Crude visual fields are within normal range.  LABORATORY DATA: Pathology reports reviewed    RADIOLOGY RESULTS: Mammogram ultrasound and MRI scans reviewed compatible with above-stated findings   IMPRESSION: Stage IIb ER/PR positive invasive mammary carcinoma of the right breast status post wide local excision and sentinel node biopsy and adjuvant chemotherapy and 82 year old female  PLAN: At this time I have recommended adjuvant radiation therapy to her right breast and peripheral lymphatics.  Would take both sites to 5040 cGy 28 fractions boosting her scar another 1000 cGy using a photon beam.  Risks and benefits of treatment occluding skin reaction fatigue alteration blood counts possible inclusion of superficial lung and slight chance of lymphedema to right upper extremity all were reviewed in detail with the patient.  After radiation she will be a candidate for endocrine therapy.  Patient comprehends my recommendations well.  I would like to take this opportunity to thank you for allowing me to participate in the care of your patient.Noreene Filbert, MD

## 2022-05-08 ENCOUNTER — Telehealth: Payer: Self-pay | Admitting: Internal Medicine

## 2022-05-08 NOTE — Telephone Encounter (Signed)
On call page received-   Patient called to report that yesterday 1/13 she started feeling hot. So checked her temp and was 101.6. then took off some clothes went down to 101.3 then 100.5. This morning it was 99.3. she did not take any tyelnol.   Otherwise asymptomatic. Home covid test negative.   Advised patient to monitor temp q6 hours. Hold off on tylenol to avoid masking of fever. She is scheduled to see Nelwyn Salisbury tomorrow.   Cc. Dr. Jacinto Reap and Judson Roch -fyi

## 2022-05-09 ENCOUNTER — Encounter: Payer: Self-pay | Admitting: Medical Oncology

## 2022-05-09 ENCOUNTER — Inpatient Hospital Stay (HOSPITAL_BASED_OUTPATIENT_CLINIC_OR_DEPARTMENT_OTHER): Payer: Medicare Other | Admitting: Medical Oncology

## 2022-05-09 ENCOUNTER — Inpatient Hospital Stay: Payer: Medicare Other

## 2022-05-09 VITALS — BP 126/74 | HR 95 | Temp 97.0°F | Resp 16 | Ht 66.5 in | Wt 178.3 lb

## 2022-05-09 DIAGNOSIS — Z5189 Encounter for other specified aftercare: Secondary | ICD-10-CM | POA: Diagnosis not present

## 2022-05-09 DIAGNOSIS — R509 Fever, unspecified: Secondary | ICD-10-CM | POA: Diagnosis not present

## 2022-05-09 DIAGNOSIS — Z170 Estrogen receptor positive status [ER+]: Secondary | ICD-10-CM

## 2022-05-09 DIAGNOSIS — Z5111 Encounter for antineoplastic chemotherapy: Secondary | ICD-10-CM | POA: Diagnosis not present

## 2022-05-09 DIAGNOSIS — C50811 Malignant neoplasm of overlapping sites of right female breast: Secondary | ICD-10-CM | POA: Diagnosis not present

## 2022-05-09 DIAGNOSIS — R197 Diarrhea, unspecified: Secondary | ICD-10-CM

## 2022-05-09 DIAGNOSIS — Z79899 Other long term (current) drug therapy: Secondary | ICD-10-CM | POA: Diagnosis not present

## 2022-05-09 DIAGNOSIS — U071 COVID-19: Secondary | ICD-10-CM | POA: Diagnosis not present

## 2022-05-09 LAB — BASIC METABOLIC PANEL
Anion gap: 11 (ref 5–15)
BUN: 20 mg/dL (ref 8–23)
CO2: 28 mmol/L (ref 22–32)
Calcium: 9.2 mg/dL (ref 8.9–10.3)
Chloride: 92 mmol/L — ABNORMAL LOW (ref 98–111)
Creatinine, Ser: 0.77 mg/dL (ref 0.44–1.00)
GFR, Estimated: >60 mL/min (ref >60)
Glucose, Bld: 120 mg/dL — ABNORMAL HIGH (ref 70–99)
Potassium: 3.6 mmol/L (ref 3.5–5.1)
Sodium: 131 mmol/L — ABNORMAL LOW (ref 135–145)

## 2022-05-09 LAB — CBC WITH DIFFERENTIAL/PLATELET
Abs Immature Granulocytes: 0.05 10*3/uL (ref 0.00–0.07)
Basophils Absolute: 0.1 10*3/uL (ref 0.0–0.1)
Basophils Relative: 3 %
Eosinophils Absolute: 0.1 10*3/uL (ref 0.0–0.5)
Eosinophils Relative: 2 %
HCT: 29.7 % — ABNORMAL LOW (ref 36.0–46.0)
Hemoglobin: 10 g/dL — ABNORMAL LOW (ref 12.0–15.0)
Immature Granulocytes: 2 %
Lymphocytes Relative: 28 %
Lymphs Abs: 0.9 10*3/uL (ref 0.7–4.0)
MCH: 33.6 pg (ref 26.0–34.0)
MCHC: 33.7 g/dL (ref 30.0–36.0)
MCV: 99.7 fL (ref 80.0–100.0)
Monocytes Absolute: 1.1 10*3/uL — ABNORMAL HIGH (ref 0.1–1.0)
Monocytes Relative: 32 %
Neutro Abs: 1.1 10*3/uL — ABNORMAL LOW (ref 1.7–7.7)
Neutrophils Relative %: 33 %
Platelets: 237 10*3/uL (ref 150–400)
RBC: 2.98 MIL/uL — ABNORMAL LOW (ref 3.87–5.11)
RDW: 14.6 % (ref 11.5–15.5)
WBC: 3.3 10*3/uL — ABNORMAL LOW (ref 4.0–10.5)
nRBC: 0 % (ref 0.0–0.2)

## 2022-05-09 MED ORDER — SODIUM CHLORIDE 0.9 % IV SOLN
Freq: Once | INTRAVENOUS | Status: AC
  Filled 2022-05-09: qty 250

## 2022-05-09 NOTE — Progress Notes (Unsigned)
[facility] NOTE  Patient Care Team: Leone Haven, MD as PCP - General (Family Medicine) Kate Sable, MD as PCP - Cardiology (Cardiology) Bary Castilla Forest Gleason, MD as Consulting Physician (General Surgery) Dallas Schimke, MD (Internal Medicine) Daiva Huge, RN as Oncology Nurse Navigator  CHIEF COMPLAINTS/PURPOSE OF CONSULTATION: Breast cancer  #  Oncology History Overview Note  # 2009- Sigmoid colon cancer stage II (T3 N0 4 lymph nodes sampled M0 stage II high risk there was obstruction and perforation abscess; Dr.Hern), workup included a low CEA preoperatively and a chest x-ray and CT scan abdomen pelvis negative for metastatic disease, S/P colostomy; s/p FOLFOX x 6 months. [Dr.Gittin ]  # LATER REVERSED , K RAS  wild type B RAF not evaluated   Also initial iron deficiency with anemia thrombocytosis, high platelets considered reactive, workup includes a normal PCR for CML and a negative [id number] mutation  DIAGNOSIS:  A. BREAST, RIGHT; LUMPECTOMY:  - INVASIVE MAMMARY CARCINOMA.  - DUCTAL CARCINOMA IN SITU (DCIS).  - SEE CANCER SUMMARY BELOW.  - BIOPSY SITE CHANGE WITH CLIP (2).  - FIBROUS TISSUE WITH CYST FORMATION AND FLORID DUCTAL HYPERPLASIA.  - SKIN AND NIPPLE NEGATIVE FOR MALIGNANCY.  - VASCULAR CALCIFICATION.   B. SENTINEL LYMPH NODES 1 AND 2, RIGHT AXILLA; EXCISION:  - METASTATIC CARCINOMA INVOLVES ONE OF TWO LYMPH NODES (1/2).   C. NON-SENTINEL LYMPH NODES, RIGHT AXILLA; EXCISION:  - ONE LYMPH NODE NEGATIVE FOR MALIGNANCY (0/1).   CANCER CASE SUMMARY: INVASIVE CARCINOMA OF THE BREAST  Standard(s): AJCC-UICC 8   SPECIMEN  Procedure: Lumpectomy  Specimen Laterality: Right   TUMOR  Histologic Type: Invasive carcinoma of no special type (ductal)  Histologic Grade (Nottingham Histologic Score)       Glandular (Acinar)/Tubular Differentiation: 3       Nuclear Pleomorphism: 3       Mitotic Rate: 3       Overall Grade: 3  Tumor Size:  24 mm  Tumor Focality: Single focus of invasive carcinoma  Ductal Carcinoma In Situ (DCIS): Present, nuclear grade 2-3  Tumor Extent: Not applicable  Lymphatic and/or Vascular Invasion: Present  Treatment Effect in the Breast: No known presurgical therapy   MARGINS  Margin Status for Invasive Carcinoma: All margins negative for invasive  carcinoma       Distance from closest margin: 10 mm       Specify closest margin: Superior   Margin Status for DCIS: All margins negative for DCIS       Distance from DCIS to closest margin: 10 mm       Specify closest margin: Superior   REGIONAL LYMPH NODES  Regional Lymph Node Status: Tumor present in regional lymph node(s)       Number of Lymph Nodes with Macrometastases (greater than 2 mm): 1       Number of Lymph Nodes with Micrometastases (greater than 0.2 mm to  2 mm and/or greater than 200 cells): 0       Number of Lymph Nodes with Isolated Tumor Cells (0.2 mm or less OR  200 cells or less): 0       Size of Largest Metastatic Deposit: 3 mm      Extranodal Extension: Not identified       Total Number of Lymph Nodes Examined (sentinel and non-sentinel): 3       Number of Sentinel Nodes Examined: 2   DISTANT METASTASIS  Distant Site(s) Involved, if applicable: Not applicable  PATHOLOGIC STAGE CLASSIFICATION (pTNM, AJCC 8th Edition):  Modified Classification: Not applicable  pT Category: pT2  T Suffix: Not applicable  pN Category: pN1a  N Suffix: (sn)  pM Category: Not applicable   SPECIAL STUDIES  Breast Biomarker Testing Performed on Previous Outside Biopsy:  [id number]  Per outside report:  Estrogen Receptor (ER) Status: POSITIVE          Percentage of cells with nuclear positivity: Greater than 90%          Average intensity of staining: Strong   Progesterone Receptor (PgR) Status: POSITIVE          Percentage of cells with nuclear positivity: 60%          Average intensity of staining: Strong   HER2 (by  immunohistochemistry): EQUIVOCAL (Score 2+)  HER2 FISH: NEGATIVE   Ki-67: Not performed   IMPRESSION: 1. Complex cystic and solid-appearing mass within the subareolar RIGHT breast, overall measuring 3.1 cm greatest dimension, the solid enhancing component at the anterior-lateral aspect of the mass measuring 2 x 1 cm, abutting the overlying skin but without evidence of involvement/enhancement of the skin. Susceptibility artifact within the mass may represent a biopsy clip (if biopsy has been performed at an outside site since the diagnostic mammogram/ultrasound of 12/28/2021) or this artifact could represent calcifications within the mass. This mass corresponds to the recent ultrasound finding of a complex irregular mass at the 11 o'clock axis of the retroareolar RIGHT breast. 2. No evidence of multifocal or multicentric disease within the RIGHT breast. 3. No evidence of contralateral disease in the LEFT breast.   RECOMMENDATION: If has not already been performed, recommend ultrasound-guided biopsy of the suspicious mass in the subareolar RIGHT breast, with preferential targeting of the anterior-lateral component of the mass which is shown to be the enhancing component on today's MRI.   BI-RADS CATEGORY  4: Suspicious.     Cancer of sigmoid ([place]) (Resolved)  Carcinoma of overlapping sites of right breast in female, estrogen receptor positive ([place])  02/14/2022 Initial Diagnosis   Carcinoma of overlapping sites of right breast in female, estrogen receptor positive ([place])   02/14/2022 Cancer Staging   Staging form: Breast, AJCC 8th Edition - Pathologic: Stage IIA (pT2, pN1, cM0, G3, ER+, PR+, HER2-) - Signed by Cammie Sickle, MD on 02/14/2022 Histologic grading system: 3 grade system   02/23/2022 -  Chemotherapy   Patient is on Treatment Plan : BREAST TC q21d        HISTORY OF PRESENTING ILLNESS:   with DIL. Ambulating independently.   Sabrina Wilson 82 y.o.   female patient with triple negative breast cancer stage II -currently on adjuvant chemotherapy with Taxotere + Cytoxan is here for follow-up.  Patient currently status post 4 cycles (05/02/2022 of Taxotere/Cytoxin chemotherapy. Patient's cycle #1 chemotherapy was complicated by skin rash.  Treated with antihistamine and steroids.  Resolved.  *** Patient states she is having some shortness of breathe. Patient states she is also having some mucus.   Patient also had myalgias/bone pain posttreatment improved with Claritin currently resolved.  Patient has mild residual neuropathy from her previous chemotherapy from oxaliplatin.  However, no falls.   Review of Systems  Constitutional:  Negative for chills, diaphoresis, fever, malaise/fatigue and weight loss.  HENT:  Negative for nosebleeds and sore throat.   Eyes:  Negative for double vision.  Respiratory:  Negative for cough, hemoptysis, sputum production, shortness of breath and wheezing.   Cardiovascular:  Negative for chest pain, palpitations,  orthopnea and leg swelling.  Gastrointestinal:  Negative for abdominal pain, blood in stool, constipation, diarrhea, heartburn, melena, nausea and vomiting.  Genitourinary:  Negative for dysuria, frequency and urgency.  Musculoskeletal:  Negative for back pain and joint pain.  Skin: Negative.  Negative for itching and rash.  Neurological:  Negative for dizziness, tingling, focal weakness, weakness and headaches.  Endo/Heme/Allergies:  Does not bruise/bleed easily.  Psychiatric/Behavioral:  Negative for depression. The patient is not nervous/anxious and does not have insomnia.      MEDICAL HISTORY:  Past Medical History:  Diagnosis Date   Actinic keratosis 02/12/2020   R lat calf (hypertrophic)   Arthritis    Cancer of sigmoid ([place]) 06/17/2016   Colon cancer ([place]) 2009   T3,N0; s/p resection  Dr. Hulda Humphrey and chemotherapy by Dr. Cynda Acres   Diastolic dysfunction    a. 08/2020 Echo: EF 60-65%, no  rwma, Gr1 DD, nl RV size/fxn. Mild MR.   Hernia of flank    History of actinic keratoses 08/11/2020   Bx proven at left lateral ankle proximal, LN2 10/08/20   History of stress test    a. 09/2020 MV: EF >65%. No ischemia/infarct. Mild Ao Ca2+ and minimal Cor Ca2+.   Hypertension    Neuropathy    Polyp at cervical os    s/p resection Dr. Sabra Heck   Skin cancer 01/22/2020   surface of an atypical verrucous squamous proliferation    Squamous cell carcinoma of skin 07/16/2020   Left lateral ankle anterior, treated with [facility] 08-11-2020    SURGICAL HISTORY: Past Surgical History:  Procedure Laterality Date   BREAST BIOPSY Right 05/2014   Dr. Dwyane Luo office-benign   BREAST LUMPECTOMY WITH SENTINEL LYMPH NODE BIOPSY Right 01/21/2022   Procedure: BREAST LUMPECTOMY WITH SENTINEL LYMPH NODE BX;  Surgeon: Robert Bellow, MD;  Location: [facility] ORS;  Service: General;  Laterality: Right;   BREAST SURGERY Right February 2016   Vacuum assisted biopsy for recurrent cyst, fibrocystic changes without evidence of malignancy.   CATARACT EXTRACTION W/PHACO Left 01/13/2015   Procedure: CATARACT EXTRACTION PHACO AND INTRAOCULAR LENS PLACEMENT (IOC);  Surgeon: Birder Robson, MD;  Location: [facility] ORS;  Service: Ophthalmology;  Laterality: Left;  [place]  1:02.9 AP   19.2 CDE  12.06 casette lot #  [id number]   CATARACT EXTRACTION W/PHACO Right 02/03/2015   Procedure: CATARACT EXTRACTION PHACO AND INTRAOCULAR LENS PLACEMENT (IOC);  Surgeon: Birder Robson, MD;  Location: [facility] ORS;  Service: Ophthalmology;  Laterality: Right;  [id number]:53 ap46.5 [id number]   COLECTOMY     COLONOSCOPY  2015   Dr Candace Cruise   COLONOSCOPY WITH PROPOFOL N/A 09/13/2017   Procedure: COLONOSCOPY WITH PROPOFOL;  Surgeon: Robert Bellow, MD;  Location: [facility] ENDOSCOPY;  Service: Endoscopy;  Laterality: N/A;   COLONOSCOPY WITH PROPOFOL N/A 11/08/2017   Procedure: COLONOSCOPY WITH PROPOFOL;  Surgeon: Robert Bellow, MD;  Location: [facility] ENDOSCOPY;   Service: Endoscopy;  Laterality: N/A;   colostomy reversal     DILATION AND CURETTAGE OF UTERUS  2014   Dr. Sabra Heck, benign per pt   EYE SURGERY     HERNIA REPAIR  07/13/12   ventral    SKIN CANCER EXCISION     TONSILLECTOMY      SOCIAL HISTORY: Social History   Socioeconomic History   Marital status: Married    Spouse name: Not on file   Number of children: Not on file   Years of education: Not on file   Highest education level: Not on file  Occupational History   Not on file  Tobacco Use   Smoking status: Former    Types: Cigarettes    Quit date: 10/12/2007    Years since quitting: 14.5   Smokeless tobacco: Never  Vaping Use   Vaping Use: Never used  Substance and Sexual Activity   Alcohol use: Yes    Alcohol/week: 1.0 standard drink of alcohol    Types: 1 Standard drinks or equivalent per week    Comment: with dinner GLASS OF WINE EACH DAY   Drug use: No   Sexual activity: Not on file  Other Topics Concern   Not on file  Social History Narrative   Lives in [place] with husband. 2 children, son lives nearby.Diet - regularExercise - none. 30 mins/in [place]. Quit smoking in 2009. Wine before dinner. Pianist in church; real estate; Adult nurse.    Social Determinants of Health   Financial Resource Strain: Low Risk  (11/13/2018)   Overall Financial Resource Strain (CARDIA)    Difficulty of Paying Living Expenses: Not hard at all  Food Insecurity: No Food Insecurity (11/16/2020)   Hunger Vital Sign    Worried About Running Out of Food in the Last Year: Never true    Ran Out of Food in the Last Year: Never true  Transportation Needs: No Transportation Needs (11/16/2020)   PRAPARE - Hydrologist (Medical): No    Lack of Transportation (Non-Medical): No  Physical Activity: Not on file  Stress: No Stress Concern Present (11/16/2020)   [place]    Feeling of Stress : Not  at all  Social Connections: Not on file  Intimate Partner Violence: Not At Risk (11/16/2020)   Humiliation, Afraid, Rape, and Kick questionnaire    Fear of Current or Ex-Partner: No    Emotionally Abused: No    Physically Abused: No    Sexually Abused: No    FAMILY HISTORY: Family History  Problem Relation Age of Onset   Stroke Mother    Diabetes Mother    Cancer Father        colon   Stroke Sister    Cancer Brother        colon   Cancer Brother        brain    ALLERGIES:  is allergic to sulfa antibiotics.  MEDICATIONS:  Current Outpatient Medications  Medication Sig Dispense Refill   atorvastatin (LIPITOR) 10 MG tablet Take 1 tablet (10 mg total) by mouth daily. 90 tablet 1   Calcium Carbonate-Vit D-Min (CALCIUM 1200 PO) Take by mouth daily.     clobetasol cream (TEMOVATE) 0.05 % Apply twice daily to affected areas with rash up to 2 weeks. Avoid applying to face, groin, and axilla. 60 g 1   clobetasol ointment (TEMOVATE) 4.26 % Apply 1 application topically 2 (two) times daily. To affected areas of rash for up to 2-3 weeks. Avoid applying to face, groin, and axilla. Use as directed. Long-term use can cause thinning of the skin. 45 g 1   hydrochlorothiazide (HYDRODIURIL) 12.5 MG tablet TAKE ONE TABLET BY MOUTH DAILY 90 tablet 1   niacinamide 500 MG tablet Take 500 mg by mouth 2 (two) times daily.     Omega-3 Fatty Acids (FISH OIL PO) Take by mouth daily.     Ruxolitinib Phosphate (OPZELURA) 1.5 % CREA Apply 1 application topically daily. 60 g 2   VITAMIN D, CHOLECALCIFEROL, PO Take 2,000 Units by mouth  daily.     ondansetron (ZOFRAN) 8 MG tablet One pill every 8 hours as needed for nausea/vomitting. (Patient not taking: Reported on 03/02/2022) 40 tablet 1   prochlorperazine (COMPAZINE) 10 MG tablet Take 1 tablet (10 mg total) by mouth every 6 (six) hours as needed for nausea or vomiting. (Patient not taking: Reported on 03/02/2022) 40 tablet 1   sodium fluoride (FLUORISHIELD) 1.1  % GEL dental gel Place onto teeth. (Patient not taking: Reported on 05/02/2022)     No current facility-administered medications for this visit.      Marland Kitchen  PHYSICAL EXAMINATION:   Vitals:   05/09/22 1019  BP: 126/74  Pulse: 95  Resp: 16  Temp: (!) 97 F (36.1 C)  SpO2: 100%   Filed Weights   05/09/22 1019  Weight: 178 lb 4.8 oz (80.9 kg)    Physical Exam Vitals and nursing note reviewed.  HENT:     Head: Normocephalic and atraumatic.     Mouth/Throat:     Pharynx: Oropharynx is clear.  Eyes:     Extraocular Movements: Extraocular movements intact.     Pupils: Pupils are equal, round, and reactive to light.  Cardiovascular:     Rate and Rhythm: Normal rate and regular rhythm.  Pulmonary:     Comments: Decreased breath sounds bilaterally.  Abdominal:     Palpations: Abdomen is soft.  Musculoskeletal:        General: Normal range of motion.     Cervical back: Normal range of motion.  Skin:    General: Skin is warm.  Neurological:     General: No focal deficit present.     Mental Status: She is alert and oriented to person, place, and time.  Psychiatric:        Behavior: Behavior normal.        Judgment: Judgment normal.      LABORATORY DATA:  I have reviewed the data as listed Lab Results  Component Value Date   WBC 3.3 (L) 05/09/2022   HGB 10.0 (L) 05/09/2022   HCT 29.7 (L) 05/09/2022   MCV 99.7 05/09/2022   PLT 237 05/09/2022   Recent Labs    03/21/22 0915 04/04/22 1339 04/11/22 0857 05/02/22 0902 05/09/22 1006  NA 134*   < > 135 133* 131*  K 3.7   < > 3.9 3.6 3.6  CL 101   < > 100 97* 92*  CO2 27   < > '24 26 28  '$ GLUCOSE 133*   < > 149* 142* 120*  BUN 21   < > '22 22 20  '$ CREATININE 0.80   < > 0.88 0.90 0.77  CALCIUM 9.8   < > 9.6 9.9 9.2  GFRNONAA >60   < > >60 >60 >60  PROT 7.0  --  6.8 6.7  --   ALBUMIN 3.8  --  3.7 3.7  --   AST 26  --  25 31  --   ALT 20  --  17 12  --   ALKPHOS 53  --  61 63  --   BILITOT 0.5  --  0.4 0.5  --    < >  = values in this interval not displayed.     RADIOGRAPHIC STUDIES: I have personally reviewed the radiological images as listed and agreed with the findings in the report. No results found.  ASSESSMENT & PLAN:   No problem-specific Assessment & Plan notes found for this encounter.    All questions were  answered. The patient/family knows to call the clinic with any problems, questions or concerns.       Hughie Closs, PA-C 05/09/2022 10:41 AM

## 2022-05-09 NOTE — Progress Notes (Unsigned)
Patient states she ran a high fever over the weekend of 101.6. She was told not to take tylenol. She is feeling ok today but has been having diarrhea and feels weak. She also states she has a rawness feeling in her mouth.

## 2022-05-10 ENCOUNTER — Encounter: Payer: Self-pay | Admitting: Internal Medicine

## 2022-05-10 MED ORDER — LEVOFLOXACIN 500 MG PO TABS: 500.0000 mg | ORAL_TABLET | Freq: Every day | ORAL | 0 refills | Status: DC

## 2022-05-11 ENCOUNTER — Other Ambulatory Visit: Payer: Self-pay

## 2022-05-11 ENCOUNTER — Inpatient Hospital Stay: Payer: Medicare Other

## 2022-05-11 ENCOUNTER — Inpatient Hospital Stay (HOSPITAL_BASED_OUTPATIENT_CLINIC_OR_DEPARTMENT_OTHER): Payer: Medicare Other | Admitting: Hospice and Palliative Medicine

## 2022-05-11 ENCOUNTER — Inpatient Hospital Stay: Payer: Medicare Other | Admitting: Hospice and Palliative Medicine

## 2022-05-11 VITALS — BP 138/88 | HR 83 | Temp 98.0°F | Resp 22 | Ht 66.5 in | Wt 178.8 lb

## 2022-05-11 DIAGNOSIS — Z5189 Encounter for other specified aftercare: Secondary | ICD-10-CM | POA: Diagnosis not present

## 2022-05-11 DIAGNOSIS — R197 Diarrhea, unspecified: Secondary | ICD-10-CM

## 2022-05-11 DIAGNOSIS — R509 Fever, unspecified: Secondary | ICD-10-CM

## 2022-05-11 DIAGNOSIS — Z79899 Other long term (current) drug therapy: Secondary | ICD-10-CM | POA: Diagnosis not present

## 2022-05-11 DIAGNOSIS — U071 COVID-19: Secondary | ICD-10-CM | POA: Diagnosis not present

## 2022-05-11 DIAGNOSIS — Z170 Estrogen receptor positive status [ER+]: Secondary | ICD-10-CM | POA: Diagnosis not present

## 2022-05-11 DIAGNOSIS — Z5111 Encounter for antineoplastic chemotherapy: Secondary | ICD-10-CM | POA: Diagnosis not present

## 2022-05-11 DIAGNOSIS — C50811 Malignant neoplasm of overlapping sites of right female breast: Secondary | ICD-10-CM

## 2022-05-11 LAB — COMPREHENSIVE METABOLIC PANEL WITH GFR
ALT: 13 U/L (ref 0–44)
AST: 24 U/L (ref 15–41)
Albumin: 3.1 g/dL — ABNORMAL LOW (ref 3.5–5.0)
Alkaline Phosphatase: 74 U/L (ref 38–126)
Anion gap: 10 (ref 5–15)
BUN: 10 mg/dL (ref 8–23)
CO2: 28 mmol/L (ref 22–32)
Calcium: 9.3 mg/dL (ref 8.9–10.3)
Chloride: 93 mmol/L — ABNORMAL LOW (ref 98–111)
Creatinine, Ser: 0.82 mg/dL (ref 0.44–1.00)
GFR, Estimated: >60 mL/min
Glucose, Bld: 122 mg/dL — ABNORMAL HIGH (ref 70–99)
Potassium: 3.6 mmol/L (ref 3.5–5.1)
Sodium: 131 mmol/L — ABNORMAL LOW (ref 135–145)
Total Bilirubin: 0.7 mg/dL (ref 0.3–1.2)
Total Protein: 6.3 g/dL — ABNORMAL LOW (ref 6.5–8.1)

## 2022-05-11 LAB — CBC WITH DIFFERENTIAL/PLATELET
Abs Immature Granulocytes: 3.14 10*3/uL — ABNORMAL HIGH (ref 0.00–0.07)
Basophils Absolute: 0.1 10*3/uL (ref 0.0–0.1)
Basophils Relative: 0 %
Eosinophils Absolute: 0.1 10*3/uL (ref 0.0–0.5)
Eosinophils Relative: 1 %
HCT: 30.5 % — ABNORMAL LOW (ref 36.0–46.0)
Hemoglobin: 10.2 g/dL — ABNORMAL LOW (ref 12.0–15.0)
Immature Granulocytes: 13 %
Lymphocytes Relative: 6 %
Lymphs Abs: 1.6 10*3/uL (ref 0.7–4.0)
MCH: 33.8 pg (ref 26.0–34.0)
MCHC: 33.4 g/dL (ref 30.0–36.0)
MCV: 101 fL — ABNORMAL HIGH (ref 80.0–100.0)
Monocytes Absolute: 2.1 10*3/uL — ABNORMAL HIGH (ref 0.1–1.0)
Monocytes Relative: 8 %
Neutro Abs: 18.1 10*3/uL — ABNORMAL HIGH (ref 1.7–7.7)
Neutrophils Relative %: 72 %
Platelets: 257 10*3/uL (ref 150–400)
RBC: 3.02 MIL/uL — ABNORMAL LOW (ref 3.87–5.11)
RDW: 14.6 % (ref 11.5–15.5)
WBC: 25 10*3/uL — ABNORMAL HIGH (ref 4.0–10.5)
nRBC: 0.2 % (ref 0.0–0.2)

## 2022-05-11 LAB — MAGNESIUM: Magnesium: 1.5 mg/dL — ABNORMAL LOW (ref 1.7–2.4)

## 2022-05-11 MED ORDER — SODIUM CHLORIDE 0.9 % IV SOLN
INTRAVENOUS | Status: DC
  Filled 2022-05-11 (×2): qty 250

## 2022-05-11 MED ORDER — MAGNESIUM SULFATE 2 GM/50ML IV SOLN
2.0000 g | Freq: Once | INTRAVENOUS | Status: AC
  Administered 2022-05-11: 2 g via INTRAVENOUS
  Filled 2022-05-11: qty 50

## 2022-05-11 NOTE — Progress Notes (Signed)
Symptom Management [place] at [facility] [phone] [phone]  Patient Care Team: Leone Haven, MD as PCP - General (Family Medicine) Kate Sable, MD as PCP - Cardiology (Cardiology) Bary Castilla Forest Gleason, MD as Consulting Physician (General Surgery) Dallas Schimke, MD (Internal Medicine) Daiva Huge, RN as Oncology Nurse Navigator   NAME OF PATIENT: Sabrina Wilson  [id number]  1940/06/19   DATE OF VISIT: 05/11/22  REASON FOR CONSULT: Sabrina Wilson is a 82 y.o. female with multiple medical problems including history of colon cancer status post resection and adjuvant chemotherapy now with recently diagnosed stage IIa ER/PR positive, HER2 negative right breast cancer.  Patient underwent surgical resection with wide excision and sentinel node biopsy on 01/21/2022.  This was followed by initiation of adjuvant TC chemotherapy and plan for future XRT.  INTERVAL HISTORY: Patient saw Nelwyn Salisbury, PA-C on 05/09/2022 for evaluation of fever with mild neutropenia.  Patient was also having diarrhea.  Patient was started empirically on Levaquin with plan to follow-up in [facility] today for evaluation of symptoms/supportive care.  Today, she reports she feels "crummy" but denies any specific complaints.  She reports resolution of diarrhea after she took Imodium.  She had 1 episode of nausea and vomiting earlier in the week but none since.  She denies any fever or chills.  Denies any neurologic complaints. Denies recent fevers or illnesses. Denies any easy bleeding or bruising. Reports good appetite and denies weight loss. Denies chest pain. Denies any nausea, vomiting, constipation, or diarrhea. Denies urinary complaints. Patient offers no further specific complaints today.   PAST MEDICAL HISTORY: Past Medical History:  Diagnosis Date   Actinic keratosis 02/12/2020   R lat calf (hypertrophic)   Arthritis    Cancer of sigmoid ([place])  06/17/2016   Colon cancer ([place]) 2009   T3,N0; s/p resection  Dr. Hulda Humphrey and chemotherapy by Dr. Cynda Acres   Diastolic dysfunction    a. 08/2020 Echo: EF 60-65%, no rwma, Gr1 DD, nl RV size/fxn. Mild MR.   Hernia of flank    History of actinic keratoses 08/11/2020   Bx proven at left lateral ankle proximal, LN2 10/08/20   History of stress test    a. 09/2020 MV: EF >65%. No ischemia/infarct. Mild Ao Ca2+ and minimal Cor Ca2+.   Hypertension    Neuropathy    Polyp at cervical os    s/p resection Dr. Sabra Heck   Skin cancer 01/22/2020   surface of an atypical verrucous squamous proliferation    Squamous cell carcinoma of skin 07/16/2020   Left lateral ankle anterior, treated with [facility] 08-11-2020    PAST SURGICAL HISTORY:  Past Surgical History:  Procedure Laterality Date   BREAST BIOPSY Right 05/2014   Dr. Dwyane Luo office-benign   BREAST LUMPECTOMY WITH SENTINEL LYMPH NODE BIOPSY Right 01/21/2022   Procedure: BREAST LUMPECTOMY WITH SENTINEL LYMPH NODE BX;  Surgeon: Robert Bellow, MD;  Location: [facility] ORS;  Service: General;  Laterality: Right;   BREAST SURGERY Right February 2016   Vacuum assisted biopsy for recurrent cyst, fibrocystic changes without evidence of malignancy.   CATARACT EXTRACTION W/PHACO Left 01/13/2015   Procedure: CATARACT EXTRACTION PHACO AND INTRAOCULAR LENS PLACEMENT (IOC);  Surgeon: Birder Robson, MD;  Location: [facility] ORS;  Service: Ophthalmology;  Laterality: Left;  [place]  1:02.9 AP   19.2 CDE  12.06 casette lot #  [id number]   CATARACT EXTRACTION W/PHACO Right 02/03/2015   Procedure: CATARACT EXTRACTION PHACO AND INTRAOCULAR LENS PLACEMENT (  [place]);  Surgeon: Birder Robson, MD;  Location: [facility] ORS;  Service: Ophthalmology;  Laterality: Right;  [id number]:53 ap46.5 [id number]   COLECTOMY     COLONOSCOPY  2015   Dr Candace Cruise   COLONOSCOPY WITH PROPOFOL N/A 09/13/2017   Procedure: COLONOSCOPY WITH PROPOFOL;  Surgeon: Robert Bellow, MD;  Location: [facility] ENDOSCOPY;  Service:  Endoscopy;  Laterality: N/A;   COLONOSCOPY WITH PROPOFOL N/A 11/08/2017   Procedure: COLONOSCOPY WITH PROPOFOL;  Surgeon: Robert Bellow, MD;  Location: [facility] ENDOSCOPY;  Service: Endoscopy;  Laterality: N/A;   colostomy reversal     DILATION AND CURETTAGE OF UTERUS  2014   Dr. Sabra Heck, benign per pt   EYE SURGERY     HERNIA REPAIR  07/13/12   ventral    SKIN CANCER EXCISION     TONSILLECTOMY      HEMATOLOGY/ONCOLOGY HISTORY:  Oncology History Overview Note  # 2009- Sigmoid colon cancer stage II (T3 N0 4 lymph nodes sampled M0 stage II high risk there was obstruction and perforation abscess; Dr.Hern), workup included a low CEA preoperatively and a chest x-ray and CT scan abdomen pelvis negative for metastatic disease, S/P colostomy; s/p FOLFOX x 6 months. [Dr.Gittin ]  # LATER REVERSED , K RAS  wild type B RAF not evaluated   Also initial iron deficiency with anemia thrombocytosis, high platelets considered reactive, workup includes a normal PCR for CML and a negative [id number] mutation  DIAGNOSIS:  A. BREAST, RIGHT; LUMPECTOMY:  - INVASIVE MAMMARY CARCINOMA.  - DUCTAL CARCINOMA IN SITU (DCIS).  - SEE CANCER SUMMARY BELOW.  - BIOPSY SITE CHANGE WITH CLIP (2).  - FIBROUS TISSUE WITH CYST FORMATION AND FLORID DUCTAL HYPERPLASIA.  - SKIN AND NIPPLE NEGATIVE FOR MALIGNANCY.  - VASCULAR CALCIFICATION.   B. SENTINEL LYMPH NODES 1 AND 2, RIGHT AXILLA; EXCISION:  - METASTATIC CARCINOMA INVOLVES ONE OF TWO LYMPH NODES (1/2).   C. NON-SENTINEL LYMPH NODES, RIGHT AXILLA; EXCISION:  - ONE LYMPH NODE NEGATIVE FOR MALIGNANCY (0/1).   CANCER CASE SUMMARY: INVASIVE CARCINOMA OF THE BREAST  Standard(s): AJCC-UICC 8   SPECIMEN  Procedure: Lumpectomy  Specimen Laterality: Right   TUMOR  Histologic Type: Invasive carcinoma of no special type (ductal)  Histologic Grade (Nottingham Histologic Score)       Glandular ([place])/Tubular Differentiation: 3       Nuclear Pleomorphism: 3        Mitotic Rate: 3       Overall Grade: 3  Tumor Size: 24 mm  Tumor Focality: Single focus of invasive carcinoma  Ductal Carcinoma In Situ (DCIS): Present, nuclear grade 2-3  Tumor Extent: Not applicable  Lymphatic and/or Vascular Invasion: Present  Treatment Effect in the Breast: No known presurgical therapy   MARGINS  Margin Status for Invasive Carcinoma: All margins negative for invasive  carcinoma       Distance from closest margin: 10 mm       Specify closest margin: [place]   Margin Status for DCIS: All margins negative for DCIS       Distance from DCIS to closest margin: 10 mm       Specify closest margin: [place]   REGIONAL LYMPH NODES  Regional Lymph Node Status: Tumor present in regional lymph node(s)       Number of Lymph Nodes with Macrometastases (greater than 2 mm): 1       Number of Lymph Nodes with Micrometastases (greater than 0.2 mm to  2 mm and/or greater than 200 cells): 0  Number of Lymph Nodes with Isolated Tumor Cells (0.2 mm or less OR  200 cells or less): 0       Size of Largest Metastatic Deposit: 3 mm      Extranodal Extension: Not identified       Total Number of Lymph Nodes Examined (sentinel and non-sentinel): 3       Number of Sentinel Nodes Examined: 2   DISTANT METASTASIS  Distant Site(s) Involved, if applicable: Not applicable   PATHOLOGIC STAGE CLASSIFICATION (pTNM, AJCC 8th Edition):  Modified Classification: Not applicable  pT Category: pT2  T Suffix: Not applicable  pN Category: pN1a  N Suffix: (sn)  pM Category: Not applicable   SPECIAL STUDIES  Breast Biomarker Testing Performed on Previous Outside Biopsy:  23-250-T11  Per outside report:  Estrogen Receptor (ER) Status: POSITIVE          Percentage of cells with nuclear positivity: Greater than 90%          Average intensity of staining: Strong   Progesterone Receptor (PgR) Status: POSITIVE          Percentage of cells with nuclear positivity: 60%          Average  intensity of staining: Strong   HER2 (by immunohistochemistry): EQUIVOCAL (Score 2+)  HER2 FISH: NEGATIVE   Ki-67: Not performed   IMPRESSION: 1. Complex cystic and solid-appearing mass within the subareolar RIGHT breast, overall measuring 3.1 cm greatest dimension, the solid enhancing component at the anterior-lateral aspect of the mass measuring 2 x 1 cm, abutting the overlying skin but without evidence of involvement/enhancement of the skin. Susceptibility artifact within the mass may represent a biopsy clip (if biopsy has been performed at an outside site since the diagnostic mammogram/ultrasound of 12/28/2021) or this artifact could represent calcifications within the mass. This mass corresponds to the recent ultrasound finding of a complex irregular mass at the 11 o'clock axis of the retroareolar RIGHT breast. 2. No evidence of multifocal or multicentric disease within the RIGHT breast. 3. No evidence of contralateral disease in the LEFT breast.   RECOMMENDATION: If has not already been performed, recommend ultrasound-guided biopsy of the suspicious mass in the subareolar RIGHT breast, with preferential targeting of the anterior-lateral component of the mass which is shown to be the enhancing component on today's MRI.   BI-RADS CATEGORY  4: Suspicious.     Cancer of sigmoid ([place]) (Resolved)  Carcinoma of overlapping sites of right breast in female, estrogen receptor positive ([place])  02/14/2022 Initial Diagnosis   Carcinoma of overlapping sites of right breast in female, estrogen receptor positive ([place])   02/14/2022 Cancer Staging   Staging form: Breast, AJCC 8th Edition - Pathologic: Stage IIA (pT2, pN1, cM0, G3, ER+, PR+, HER2-) - Signed by Cammie Sickle, MD on 02/14/2022 Histologic grading system: 3 grade system   02/23/2022 -  Chemotherapy   Patient is on Treatment Plan : BREAST TC q21d       ALLERGIES:  is allergic to sulfa antibiotics.  MEDICATIONS:   Current Outpatient Medications  Medication Sig Dispense Refill   atorvastatin (LIPITOR) 10 MG tablet Take 1 tablet (10 mg total) by mouth daily. 90 tablet 1   Calcium Carbonate-Vit D-Min (CALCIUM 1200 PO) Take by mouth daily.     clobetasol cream (TEMOVATE) 0.05 % Apply twice daily to affected areas with rash up to 2 weeks. Avoid applying to face, groin, and axilla. 60 g 1   hydrochlorothiazide (HYDRODIURIL) 12.5 MG tablet TAKE ONE TABLET  BY MOUTH DAILY 90 tablet 1   levofloxacin (LEVAQUIN) 500 MG tablet Take 1 tablet (500 mg total) by mouth daily. 7 tablet 0   niacinamide 500 MG tablet Take 500 mg by mouth 2 (two) times daily.     Omega-3 Fatty Acids (FISH OIL PO) Take by mouth daily.     Ruxolitinib Phosphate (OPZELURA) 1.5 % CREA Apply 1 application topically daily. 60 g 2   VITAMIN D, CHOLECALCIFEROL, PO Take 2,000 Units by mouth daily.     ondansetron (ZOFRAN) 8 MG tablet One pill every 8 hours as needed for nausea/vomitting. (Patient not taking: Reported on 03/02/2022) 40 tablet 1   prochlorperazine (COMPAZINE) 10 MG tablet Take 1 tablet (10 mg total) by mouth every 6 (six) hours as needed for nausea or vomiting. (Patient not taking: Reported on 03/02/2022) 40 tablet 1   sodium fluoride (FLUORISHIELD) 1.1 % GEL dental gel Place onto teeth. (Patient not taking: Reported on 05/02/2022)     No current facility-administered medications for this visit.    VITAL SIGNS: There were no vitals taken for this visit. There were no vitals filed for this visit.  Estimated body mass index is 28.35 kg/m as calculated from the following:   Height as of 05/09/22: 5' 6.5" (1.689 m).   Weight as of 05/09/22: 178 lb 4.8 oz (80.9 kg).  LABS: CBC:    Component Value Date/Time   WBC 25.0 (H) 05/11/2022 0944   HGB 10.2 (L) 05/11/2022 0944   HGB 12.4 05/15/2013 1445   HCT 30.5 (L) 05/11/2022 0944   HCT 38.2 05/15/2013 1445   PLT 257 05/11/2022 0944   PLT 255 05/15/2013 1445   MCV 101.0 (H) 05/11/2022  0944   MCV 96 05/15/2013 1445   NEUTROABS PENDING 05/11/2022 0944   NEUTROABS 3.9 05/15/2013 1445   LYMPHSABS PENDING 05/11/2022 0944   LYMPHSABS 2.0 05/15/2013 1445   MONOABS PENDING 05/11/2022 0944   MONOABS 0.6 05/15/2013 1445   EOSABS PENDING 05/11/2022 0944   EOSABS 0.4 05/15/2013 1445   BASOSABS PENDING 05/11/2022 0944   BASOSABS 0.1 05/15/2013 1445   Comprehensive Metabolic Panel:    Component Value Date/Time   NA 131 (L) 05/09/2022 1006   NA 133 (L) 07/15/2012 2105   K 3.6 05/09/2022 1006   K 3.8 07/18/2012 0503   CL 92 (L) 05/09/2022 1006   CL 100 07/15/2012 2105   CO2 28 05/09/2022 1006   CO2 28 07/15/2012 2105   BUN 20 05/09/2022 1006   BUN 10 07/15/2012 2105   CREATININE 0.77 05/09/2022 1006   CREATININE 0.68 07/15/2012 2105   GLUCOSE 120 (H) 05/09/2022 1006   GLUCOSE 136 (H) 07/15/2012 2105   CALCIUM 9.2 05/09/2022 1006   CALCIUM 8.4 (L) 07/15/2012 2105   AST 31 05/02/2022 0902   AST 14 (L) 06/22/2011 1323   ALT 12 05/02/2022 0902   ALT 24 06/22/2011 1323   ALKPHOS 63 05/02/2022 0902   ALKPHOS 72 06/22/2011 1323   BILITOT 0.5 05/02/2022 0902   BILITOT 0.4 06/22/2011 1323   PROT 6.7 05/02/2022 0902   PROT 7.5 06/22/2011 1323   ALBUMIN 3.7 05/02/2022 0902   ALBUMIN 3.8 06/22/2011 1323    RADIOGRAPHIC STUDIES: No results found.  PERFORMANCE STATUS (ECOG) : 1 - Symptomatic but completely ambulatory  Review of Systems Unless otherwise noted, a complete review of systems is negative.  Physical Exam General: NAD Cardiovascular: regular rate and rhythm Pulmonary: clear anterior/posterior fields Abdomen: soft, nontender, + bowel sounds GU: no  suprapubic tenderness Extremities: no edema, no joint deformities Skin: No rashes Neurological: Grossly nonfocal  IMPRESSION/PLAN: Dehydration-diarrhea appears to have resolved.  Will proceed with IV fluids and IV mag today.  Continue supportive care as needed.  Follow-up labs/fluids 1 week  Patient  expressed understanding and was in agreement with this plan. She also understands that She can call clinic at any time with any questions, concerns, or complaints.   Thank you for allowing me to participate in the care of this very pleasant patient.   Time Total: 15 minutes  Visit consisted of counseling and education dealing with the complex and emotionally intense issues of symptom management in the setting of serious illness.Greater than 50%  of this time was spent counseling and coordinating care related to the above assessment and plan.  Signed by: Altha Harm, PhD, NP-C

## 2022-05-12 ENCOUNTER — Telehealth: Payer: Self-pay | Admitting: *Deleted

## 2022-05-12 ENCOUNTER — Emergency Department: Payer: Medicare Other

## 2022-05-12 ENCOUNTER — Other Ambulatory Visit: Payer: Self-pay

## 2022-05-12 ENCOUNTER — Inpatient Hospital Stay
Admission: EM | Admit: 2022-05-12 | Discharge: 2022-05-17 | DRG: 871 | Disposition: A | Discharge status: Home / Self Care | Payer: Medicare Other | Attending: Internal Medicine | Admitting: Internal Medicine

## 2022-05-12 DIAGNOSIS — I10 Essential (primary) hypertension: Secondary | ICD-10-CM | POA: Diagnosis present

## 2022-05-12 DIAGNOSIS — Z170 Estrogen receptor positive status [ER+]: Secondary | ICD-10-CM

## 2022-05-12 DIAGNOSIS — R6520 Severe sepsis without septic shock: Secondary | ICD-10-CM | POA: Diagnosis not present

## 2022-05-12 DIAGNOSIS — Z833 Family history of diabetes mellitus: Secondary | ICD-10-CM

## 2022-05-12 DIAGNOSIS — Z1152 Encounter for screening for COVID-19: Secondary | ICD-10-CM | POA: Diagnosis not present

## 2022-05-12 DIAGNOSIS — E876 Hypokalemia: Secondary | ICD-10-CM | POA: Diagnosis not present

## 2022-05-12 DIAGNOSIS — K5900 Constipation, unspecified: Secondary | ICD-10-CM | POA: Diagnosis not present

## 2022-05-12 DIAGNOSIS — I2489 Other forms of acute ischemic heart disease: Secondary | ICD-10-CM

## 2022-05-12 DIAGNOSIS — Z85828 Personal history of other malignant neoplasm of skin: Secondary | ICD-10-CM

## 2022-05-12 DIAGNOSIS — J189 Pneumonia, unspecified organism: Secondary | ICD-10-CM | POA: Diagnosis present

## 2022-05-12 DIAGNOSIS — R197 Diarrhea, unspecified: Secondary | ICD-10-CM | POA: Diagnosis present

## 2022-05-12 DIAGNOSIS — A4101 Sepsis due to Methicillin susceptible Staphylococcus aureus: Principal | ICD-10-CM | POA: Diagnosis present

## 2022-05-12 DIAGNOSIS — J9601 Acute respiratory failure with hypoxia: Secondary | ICD-10-CM | POA: Diagnosis not present

## 2022-05-12 DIAGNOSIS — I110 Hypertensive heart disease with heart failure: Secondary | ICD-10-CM | POA: Diagnosis present

## 2022-05-12 DIAGNOSIS — A419 Sepsis, unspecified organism: Secondary | ICD-10-CM | POA: Diagnosis not present

## 2022-05-12 DIAGNOSIS — R0602 Shortness of breath: Secondary | ICD-10-CM | POA: Diagnosis not present

## 2022-05-12 DIAGNOSIS — R0902 Hypoxemia: Secondary | ICD-10-CM | POA: Diagnosis not present

## 2022-05-12 DIAGNOSIS — J441 Chronic obstructive pulmonary disease with (acute) exacerbation: Secondary | ICD-10-CM | POA: Diagnosis not present

## 2022-05-12 DIAGNOSIS — Z823 Family history of stroke: Secondary | ICD-10-CM

## 2022-05-12 DIAGNOSIS — I493 Ventricular premature depolarization: Secondary | ICD-10-CM | POA: Diagnosis not present

## 2022-05-12 DIAGNOSIS — Z85038 Personal history of other malignant neoplasm of large intestine: Secondary | ICD-10-CM | POA: Diagnosis not present

## 2022-05-12 DIAGNOSIS — R109 Unspecified abdominal pain: Secondary | ICD-10-CM | POA: Diagnosis not present

## 2022-05-12 DIAGNOSIS — C50811 Malignant neoplasm of overlapping sites of right female breast: Secondary | ICD-10-CM | POA: Diagnosis not present

## 2022-05-12 DIAGNOSIS — Z800 Family history of malignant neoplasm of digestive organs: Secondary | ICD-10-CM

## 2022-05-12 DIAGNOSIS — D849 Immunodeficiency, unspecified: Secondary | ICD-10-CM | POA: Diagnosis present

## 2022-05-12 DIAGNOSIS — Z9221 Personal history of antineoplastic chemotherapy: Secondary | ICD-10-CM

## 2022-05-12 DIAGNOSIS — R000 Tachycardia, unspecified: Secondary | ICD-10-CM | POA: Diagnosis not present

## 2022-05-12 DIAGNOSIS — Z87891 Personal history of nicotine dependence: Secondary | ICD-10-CM | POA: Diagnosis not present

## 2022-05-12 DIAGNOSIS — Z9049 Acquired absence of other specified parts of digestive tract: Secondary | ICD-10-CM

## 2022-05-12 DIAGNOSIS — Z79899 Other long term (current) drug therapy: Secondary | ICD-10-CM

## 2022-05-12 DIAGNOSIS — I5032 Chronic diastolic (congestive) heart failure: Secondary | ICD-10-CM | POA: Diagnosis not present

## 2022-05-12 DIAGNOSIS — R509 Fever, unspecified: Secondary | ICD-10-CM | POA: Diagnosis not present

## 2022-05-12 DIAGNOSIS — J440 Chronic obstructive pulmonary disease with acute lower respiratory infection: Secondary | ICD-10-CM | POA: Diagnosis present

## 2022-05-12 LAB — CBC WITH DIFFERENTIAL/PLATELET
Abs Immature Granulocytes: 2.8 10*3/uL — ABNORMAL HIGH (ref 0.00–0.07)
Basophils Absolute: 0.1 10*3/uL (ref 0.0–0.1)
Basophils Relative: 0 %
Eosinophils Absolute: 0.1 10*3/uL (ref 0.0–0.5)
Eosinophils Relative: 0 %
HCT: 32.8 % — ABNORMAL LOW (ref 36.0–46.0)
Hemoglobin: 10.6 g/dL — ABNORMAL LOW (ref 12.0–15.0)
Immature Granulocytes: 9 %
Lymphocytes Relative: 9 %
Lymphs Abs: 2.6 10*3/uL (ref 0.7–4.0)
MCH: 32.6 pg (ref 26.0–34.0)
MCHC: 32.3 g/dL (ref 30.0–36.0)
MCV: 100.9 fL — ABNORMAL HIGH (ref 80.0–100.0)
Monocytes Absolute: 2.1 10*3/uL — ABNORMAL HIGH (ref 0.1–1.0)
Monocytes Relative: 7 %
Neutro Abs: 22 10*3/uL — ABNORMAL HIGH (ref 1.7–7.7)
Neutrophils Relative %: 75 %
Platelets: 283 10*3/uL (ref 150–400)
RBC: 3.25 MIL/uL — ABNORMAL LOW (ref 3.87–5.11)
RDW: 14.9 % (ref 11.5–15.5)
Smear Review: NORMAL
WBC: 29.7 10*3/uL — ABNORMAL HIGH (ref 4.0–10.5)
nRBC: 0.3 % — ABNORMAL HIGH (ref 0.0–0.2)

## 2022-05-12 LAB — RESPIRATORY PANEL BY PCR

## 2022-05-12 LAB — URINALYSIS, COMPLETE (UACMP) WITH MICROSCOPIC
Bacteria, UA: NONE SEEN
Bilirubin Urine: NEGATIVE
Glucose, UA: NEGATIVE mg/dL
Hgb urine dipstick: NEGATIVE
Ketones, ur: NEGATIVE mg/dL
Nitrite: NEGATIVE
Protein, ur: NEGATIVE mg/dL
Specific Gravity, Urine: 1.015 (ref 1.005–1.030)
pH: 7 (ref 5.0–8.0)

## 2022-05-12 LAB — MAGNESIUM: Magnesium: 1.9 mg/dL (ref 1.7–2.4)

## 2022-05-12 LAB — COMPREHENSIVE METABOLIC PANEL
ALT: 14 U/L (ref 0–44)
AST: 23 U/L (ref 15–41)
Albumin: 3.4 g/dL — ABNORMAL LOW (ref 3.5–5.0)
Alkaline Phosphatase: 97 U/L (ref 38–126)
Anion gap: 10 (ref 5–15)
BUN: 10 mg/dL (ref 8–23)
CO2: 27 mmol/L (ref 22–32)
Calcium: 9.6 mg/dL (ref 8.9–10.3)
Chloride: 93 mmol/L — ABNORMAL LOW (ref 98–111)
Creatinine, Ser: 0.75 mg/dL (ref 0.44–1.00)
GFR, Estimated: >60 mL/min (ref >60)
Glucose, Bld: 134 mg/dL — ABNORMAL HIGH (ref 70–99)
Potassium: 3.4 mmol/L — ABNORMAL LOW (ref 3.5–5.1)
Sodium: 130 mmol/L — ABNORMAL LOW (ref 135–145)
Total Bilirubin: 0.6 mg/dL (ref 0.3–1.2)
Total Protein: 6.8 g/dL (ref 6.5–8.1)

## 2022-05-12 LAB — TROPONIN I (HIGH SENSITIVITY)
Troponin I (High Sensitivity): 26 ng/L — ABNORMAL HIGH (ref <18)
Troponin I (High Sensitivity): 58 ng/L — ABNORMAL HIGH (ref <18)

## 2022-05-12 LAB — BLOOD GAS, VENOUS
Acid-Base Excess: 4.6 mmol/L — ABNORMAL HIGH (ref 0.0–2.0)
Bicarbonate: 31.6 mmol/L — ABNORMAL HIGH (ref 20.0–28.0)
O2 Saturation: 51.8 %
Patient temperature: 37
pCO2, Ven: 56 mmHg (ref 44–60)
pH, Ven: 7.36 (ref 7.25–7.43)
pO2, Ven: 35 mmHg (ref 32–45)

## 2022-05-12 LAB — BRAIN NATRIURETIC PEPTIDE: B Natriuretic Peptide: 379.6 pg/mL — ABNORMAL HIGH (ref 0.0–100.0)

## 2022-05-12 LAB — EXPECTORATED SPUTUM ASSESSMENT W GRAM STAIN, RFLX TO RESP C

## 2022-05-12 LAB — LACTIC ACID, PLASMA
Lactic Acid, Venous: 2.1 mmol/L (ref 0.5–1.9)
Lactic Acid, Venous: 2.4 mmol/L (ref 0.5–1.9)

## 2022-05-12 LAB — RESP PANEL BY RT-PCR (RSV, FLU A&B, COVID)  RVPGX2
Influenza A by PCR: NEGATIVE
Influenza B by PCR: NEGATIVE
Resp Syncytial Virus by PCR: NEGATIVE
SARS Coronavirus 2 by RT PCR: NEGATIVE

## 2022-05-12 MED ORDER — SODIUM CHLORIDE 0.9 % IV BOLUS
500.0000 mL | Freq: Once | INTRAVENOUS | Status: AC
  Administered 2022-05-12: 500 mL via INTRAVENOUS

## 2022-05-12 MED ORDER — ATORVASTATIN CALCIUM 10 MG PO TABS
10.0000 mg | ORAL_TABLET | Freq: Every day | ORAL | Status: DC
  Administered 2022-05-13 – 2022-05-17 (×5): 10 mg via ORAL
  Filled 2022-05-12 (×5): qty 1

## 2022-05-12 MED ORDER — IPRATROPIUM-ALBUTEROL 0.5-2.5 (3) MG/3ML IN SOLN
3.0000 mL | Freq: Four times a day (QID) | RESPIRATORY_TRACT | Status: DC
  Administered 2022-05-12 – 2022-05-13 (×6): 3 mL via RESPIRATORY_TRACT
  Filled 2022-05-12 (×6): qty 3

## 2022-05-12 MED ORDER — NIACIN ER (ANTIHYPERLIPIDEMIC) 500 MG PO TBCR
500.0000 mg | EXTENDED_RELEASE_TABLET | Freq: Every day | ORAL | Status: DC
  Administered 2022-05-13 – 2022-05-16 (×4): 500 mg via ORAL
  Filled 2022-05-12 (×4): qty 1

## 2022-05-12 MED ORDER — METRONIDAZOLE 500 MG/100ML IV SOLN
500.0000 mg | Freq: Once | INTRAVENOUS | Status: DC
  Administered 2022-05-12: 500 mg via INTRAVENOUS
  Filled 2022-05-12: qty 100

## 2022-05-12 MED ORDER — SODIUM CHLORIDE 0.9 % IV BOLUS
1000.0000 mL | Freq: Once | INTRAVENOUS | Status: AC
  Administered 2022-05-12: 1000 mL via INTRAVENOUS

## 2022-05-12 MED ORDER — SODIUM CHLORIDE 0.9 % IV SOLN
2.0000 g | Freq: Once | INTRAVENOUS | Status: AC
  Administered 2022-05-12: 2 g via INTRAVENOUS
  Filled 2022-05-12: qty 12.5

## 2022-05-12 MED ORDER — IPRATROPIUM-ALBUTEROL 0.5-2.5 (3) MG/3ML IN SOLN
9.0000 mL | Freq: Once | RESPIRATORY_TRACT | Status: AC
  Administered 2022-05-12: 9 mL via RESPIRATORY_TRACT
  Filled 2022-05-12: qty 3

## 2022-05-12 MED ORDER — SODIUM CHLORIDE 0.9 % IV SOLN
500.0000 mg | INTRAVENOUS | Status: DC
  Administered 2022-05-12 – 2022-05-13 (×2): 500 mg via INTRAVENOUS
  Filled 2022-05-12 (×3): qty 5

## 2022-05-12 MED ORDER — BENZONATATE 100 MG PO CAPS
200.0000 mg | ORAL_CAPSULE | Freq: Three times a day (TID) | ORAL | Status: DC
  Administered 2022-05-12 – 2022-05-17 (×15): 200 mg via ORAL
  Filled 2022-05-12 (×15): qty 2

## 2022-05-12 MED ORDER — ENOXAPARIN SODIUM 40 MG/0.4ML IJ SOSY
40.0000 mg | PREFILLED_SYRINGE | INTRAMUSCULAR | Status: DC
  Administered 2022-05-12 – 2022-05-16 (×5): 40 mg via SUBCUTANEOUS
  Filled 2022-05-12 (×5): qty 0.4

## 2022-05-12 MED ORDER — HYDROCHLOROTHIAZIDE 12.5 MG PO TABS: 12.5000 mg | ORAL_TABLET | Freq: Every day | ORAL | Status: DC

## 2022-05-12 MED ORDER — PREDNISONE 20 MG PO TABS
40.0000 mg | ORAL_TABLET | Freq: Every day | ORAL | Status: DC
  Administered 2022-05-13: 40 mg via ORAL
  Filled 2022-05-12: qty 2

## 2022-05-12 MED ORDER — VANCOMYCIN HCL 1750 MG/350ML IV SOLN
1750.0000 mg | Freq: Once | INTRAVENOUS | Status: DC
  Filled 2022-05-12: qty 350

## 2022-05-12 MED ORDER — GUAIFENESIN ER 600 MG PO TB12
600.0000 mg | ORAL_TABLET | Freq: Two times a day (BID) | ORAL | Status: DC
  Administered 2022-05-12 – 2022-05-17 (×10): 600 mg via ORAL
  Filled 2022-05-12 (×10): qty 1

## 2022-05-12 MED ORDER — IOHEXOL 350 MG/ML SOLN
100.0000 mL | Freq: Once | INTRAVENOUS | Status: AC | PRN
  Administered 2022-05-12: 100 mL via INTRAVENOUS

## 2022-05-12 MED ORDER — SODIUM CHLORIDE 0.9 % IV SOLN
2.0000 g | INTRAVENOUS | Status: DC
  Administered 2022-05-12 – 2022-05-16 (×5): 2 g via INTRAVENOUS
  Filled 2022-05-12: qty 20
  Filled 2022-05-12 (×2): qty 2
  Filled 2022-05-12 (×2): qty 20

## 2022-05-12 MED ORDER — METHYLPREDNISOLONE SODIUM SUCC 125 MG IJ SOLR
125.0000 mg | Freq: Once | INTRAMUSCULAR | Status: AC
  Administered 2022-05-12: 125 mg via INTRAVENOUS
  Filled 2022-05-12: qty 2

## 2022-05-12 MED ORDER — ACETAMINOPHEN 500 MG PO TABS
1000.0000 mg | ORAL_TABLET | Freq: Once | ORAL | Status: AC
  Administered 2022-05-12: 1000 mg via ORAL
  Filled 2022-05-12: qty 2

## 2022-05-12 NOTE — ED Notes (Signed)
Pure wick placed.

## 2022-05-12 NOTE — Progress Notes (Signed)
Patient trialed off BIPAP, placed on 3L [place] and tolerating well. SpO2 97%.

## 2022-05-12 NOTE — Assessment & Plan Note (Addendum)
Patient is presenting with several week history of gradually worsening shortness of breath and productive cough with significantly increased work of breathing on examination in addition to diffuse wheezing and rhonchi.  Given extensive smoking history, this is concerning for COPD exacerbation although she has never been formally diagnosed with COPD.  CT imaging with evidence of bronchitis, but no evidence of pneumonia.  Although BNP is elevated and patient has lower extremity edema, no evidence of pulmonary edema or vascular congestion on imaging.  She did get fluids in the ED though, so we will monitor closely for evidence of developing pulmonary edema and give Lasix at that time.  No evidence of sepsis at this time, however patient is certainly at very high risk  - Continue BiPAP due to increased work of breathing - Solu-Medrol 125 mg once - Start prednisone 40 mg tomorrow to complete a 5-day course - Continue Ceftriaxone and azithromycin - DuoNebs every 6 hours - RVP  - Expectorated sputum culture - Blood and urine cultures

## 2022-05-12 NOTE — Consult Note (Signed)
PHARMACY -  BRIEF ANTIBIOTIC NOTE   Pharmacy has received consult(s) for vancomycin dosing from an ED provider.  The patient's profile has been reviewed for ht/wt/allergies/indication/available labs.    One time order(s) placed for Vancomycin 1750 mg IV  Further antibiotics/pharmacy consults should be ordered by admitting physician if indicated.                       Thank you, Lorin Picket, PharmD 05/12/2022  1:28 PM

## 2022-05-12 NOTE — Telephone Encounter (Signed)
Recommend ED via EMS

## 2022-05-12 NOTE — ED Provider Notes (Signed)
[facility] Provider Note    Event Date/Time   First MD Initiated Contact with Patient 05/12/22 1223     (approximate)   History   Shortness of Breath   HPI  Sabrina Wilson is a 82 y.o. female   Past medical history of breast cancer on chemotherapy last chemotherapy session was approximately 3 weeks ago who presents to the emergency department with profound fatigue, diarrhea, shortness of breath over the last several days worsening.  States that she gets similar symptoms after her chemotherapy sessions but this 1 is more severe and prolonged.  Started on levofloxacin a couple days ago after reporting diarrhea to her physician.  She denies abdominal pain or urinary symptoms.  Denies chest pain.  She has had a productive cough.  She arrives tachycardic in the low 100s, febrile 100.8 rectal temperature, respiratory rate of 22 and hypoxemic requiring new oxygen requirement 88% now on 2 L with improvement.  Independent Historian contributed to assessment above: Husband.  External Medical Documents Reviewed: Hospice and palliative care progress note from yesterday documenting generalized fatigue and diarrhea and prescription for levofloxacin.      Physical Exam   Triage Vital Signs: ED Triage Vitals  Enc Vitals Group     BP 05/12/22 1221 (!) 145/88     Pulse Rate 05/12/22 1221 (!) 103     Resp 05/12/22 1221 (!) 22     Temp 05/12/22 1221 99.5 F (37.5 C)     Temp src --      SpO2 05/12/22 1221 (!) 88 %     Weight --      Height --      Head Circumference --      Peak Flow --      Pain Score 05/12/22 1220 3     Pain Loc --      Pain Edu? --      Excl. in [place]? --     Most recent vital signs: Vitals:   05/12/22 1247 05/12/22 1500  BP:  (!) 110/59  Pulse:  (!) 133  Resp:  (!) 35  Temp: (!) 100.8 F (38.2 C)   SpO2:  96%    General: Awake, no distress.  CV:  Good peripheral perfusion.  Resp:  Normal effort.  Abd:  No distention.   Other:  Hemodynamics significant for tachycardia, tachypnea, hypoxemia and fever.  She appears weak, diffuse wheezing throughout all lung fields, mucous membranes are dry skin turgor poor.  Clinically dehydrated.  Abdomen soft and nontender.   ED Results / Procedures / Treatments   Labs (all labs ordered are listed, but only abnormal results are displayed) Labs Reviewed  COMPREHENSIVE METABOLIC PANEL - Abnormal; Notable for the following components:      Result Value   Sodium 130 (*)    Potassium 3.4 (*)    Chloride 93 (*)    Glucose, Bld 134 (*)    Albumin 3.4 (*)    All other components within normal limits  LACTIC ACID, PLASMA - Abnormal; Notable for the following components:   Lactic Acid, Venous 2.1 (*)    All other components within normal limits  CBC WITH DIFFERENTIAL/PLATELET - Abnormal; Notable for the following components:   WBC 29.7 (*)    RBC 3.25 (*)    Hemoglobin 10.6 (*)    HCT 32.8 (*)    MCV 100.9 (*)    nRBC 0.3 (*)    Neutro Abs 22.0 (*)  Monocytes Absolute 2.1 (*)    Abs Immature Granulocytes 2.80 (*)    All other components within normal limits  BLOOD GAS, VENOUS - Abnormal; Notable for the following components:   Bicarbonate 31.6 (*)    Acid-Base Excess 4.6 (*)    All other components within normal limits  URINALYSIS, COMPLETE (UACMP) WITH MICROSCOPIC - Abnormal; Notable for the following components:   Color, Urine YELLOW (*)    APPearance CLEAR (*)    Leukocytes,Ua TRACE (*)    All other components within normal limits  TROPONIN I (HIGH SENSITIVITY) - Abnormal; Notable for the following components:   Troponin I (High Sensitivity) 26 (*)    All other components within normal limits  RESP PANEL BY RT-PCR (RSV, FLU A&B, COVID)  [id number]  CULTURE, BLOOD (ROUTINE X 2)  CULTURE, BLOOD (ROUTINE X 2)  URINE CULTURE  EXPECTORATED SPUTUM ASSESSMENT W GRAM STAIN, RFLX TO RESP C  MAGNESIUM  LACTIC ACID, PLASMA  BRAIN NATRIURETIC PEPTIDE  TROPONIN I (HIGH  SENSITIVITY)     I ordered and reviewed the above labs they are notable for markedly elevated white blood cell count above 20  EKG  ED ECG REPORT I, Lucillie Garfinkel, the attending physician, personally viewed and interpreted this ECG.   Date: 05/12/2022  EKG Time: 1230  Rate: 108  Rhythm: sinus tachycardia  Axis: lad  Intervals: none  ST&T Change: No acute ischemic +PVCs    RADIOLOGY I independently reviewed and interpreted CXR and I see no obvious focalities or pneumothorax.   PROCEDURES:  Critical Care performed: Yes, see critical care procedure note(s)  .Critical Care  Performed by: Lucillie Garfinkel, MD Authorized by: Lucillie Garfinkel, MD   Critical care provider statement:    Critical care time (minutes):  30   Critical care was necessary to treat or prevent imminent or life-threatening deterioration of the following conditions:  Sepsis   Critical care was time spent personally by me on the following activities:  Development of treatment plan with patient or surrogate, discussions with consultants, evaluation of patient's response to treatment, examination of patient, ordering and review of laboratory studies, ordering and review of radiographic studies, ordering and performing treatments and interventions, pulse oximetry, re-evaluation of patient's condition and review of old [place] ED: Medications  methylPREDNISolone sodium succinate (SOLU-MEDROL) 125 mg/2 mL injection 125 mg (has no administration in time range)  azithromycin (ZITHROMAX) 500 mg in sodium chloride 0.9 % 250 mL IVPB (has no administration in time range)  ipratropium-albuterol (DUONEB) 0.5-2.5 (3) MG/3ML nebulizer solution 3 mL (has no administration in time range)  ipratropium-albuterol (DUONEB) 0.5-2.5 (3) MG/3ML nebulizer solution 9 mL (9 mLs Nebulization Given 05/12/22 1311)  sodium chloride 0.9 % bolus 1,000 mL (1,000 mLs Intravenous New Bag/Given 05/12/22 1403)  sodium chloride 0.9 %  bolus 500 mL (500 mLs Intravenous New Bag/Given 05/12/22 1436)  ceFEPIme (MAXIPIME) 2 g in sodium chloride 0.9 % 100 mL IVPB (0 g Intravenous Stopped 05/12/22 1517)  acetaminophen (TYLENOL) tablet 1,000 mg (1,000 mg Oral Given 05/12/22 1359)  iohexol (OMNIPAQUE) 350 MG/ML injection 100 mL (100 mLs Intravenous Contrast Given 05/12/22 1409)    External physician / consultants:  I spoke with hospitalist for admission and regarding care plan for this patient.   IMPRESSION / MDM / ASSESSMENT AND PLAN / ED COURSE  I reviewed the triage vital signs and the nursing notes.  Patient's presentation is most consistent with acute presentation with potential threat to life or bodily function.  Differential diagnosis includes, but is not limited to, sepsis, urinary tract infection, respiratory infection, intra-abdominal infection, COPD exacerbation   The patient is on the cardiac monitor to evaluate for evidence of arrhythmia and/or significant heart rate changes.  MDM: Chemotherapy patient immunocompromised with evidence of sepsis potential sources include respiratory given shortness of breath and wheezing, intra-abdominal given diarrhea, check urinalysis as well.  Broad-spectrum antibiotics and 30 cc/kg sepsis fluid bolus ordered at this time.  Tylenol for fever and  DuoNeb for wheezing.  Will need admission,.   Evidence of bronchial thickening likely pna w cough dyspnea and hypoxia remains ok on 2L [place]         FINAL CLINICAL IMPRESSION(S) / ED DIAGNOSES   Final diagnoses:  Hypoxia  Community acquired pneumonia, unspecified laterality  Fever, unspecified fever cause  Acute hypoxic respiratory failure ([place])     Rx / DC Orders   ED Discharge Orders     None        Note:  This document was prepared using Dragon voice recognition software and may include unintentional dictation errors.    Lucillie Garfinkel, MD 05/12/22 [phone]

## 2022-05-12 NOTE — ED Triage Notes (Signed)
Pt comes with c/o increased sob for last week. Pt states it got worse last night. Pt was 86 % RA. Pt placed on 2L [place] and increased to 93.  Pt states cough that was productive. Pt states weak and wore out.

## 2022-05-12 NOTE — Assessment & Plan Note (Signed)
Patient's blood pressure has slowly trended down during admission, so we will hold home hydrochlorothiazide for now

## 2022-05-12 NOTE — Assessment & Plan Note (Signed)
Patient has recently completed 4 courses of Taxotere-cytotoxin with plans to initiate radiation therapy shortly.  No evidence of pneumonitis on imaging.

## 2022-05-12 NOTE — H&P (Signed)
History and Physical    Patient: Sabrina Wilson VHQ:[id number] DOB: 1940/11/28 DOA: 05/12/2022 DOS: the patient was seen and examined on 05/12/2022 PCP: Leone Haven, MD  Patient coming from: Home  Chief Complaint:  Chief Complaint  Patient presents with   Shortness of Breath   HPI: Sabrina Wilson is a 82 y.o. female with medical history significant of stage II breast cancer on Taxotere/Cytoxin, colon cancer s/p colectomy, HFpEF, hypertension, who presents to the ED due to shortness of breath.  Sabrina Wilson states that she has been feeling short of breath for several weeks now since her last chemotherapy infusion on 04/11/2022.  In addition, she has been experiencing a productive cough.  Over the last 2-3 days, she has noticed several febrile episodes at home up to 100.6.  In addition, she developed nonbloody diarrhea.  She followed up with her oncologist regarding the diarrhea and was started on levofloxacin.  Due to progressively worsening shortness of breath and work of breathing, she came to the ED today.  She denies any chest pain or palpitations.  She endorses nausea but no recent vomiting or abdominal pain.  Patient states that she has not smoked cigarettes in 14 years, but prior to that, she has at least a 30-pack-year smoking history  ED course: On arrival to the ED, patient was hypertensive at 145/88 with heart rate of 103 respiratory rate of 22.  She was febrile at 100.8.  She was saturating at 88% on room air.  Initial workup remarkable for pCO2 of 56 on VBG, potassium of 3.4, glucose of 134, creatinine 0.75.  CBC demonstrated markedly elevated WBC at 29.7 with hemoglobin of 10.6.  Initial troponin elevated at 26.  COVID-19, RSV and influenza negative.  Initial lactic acid elevated at 2.1. CTA of the chest and CT abdomen/pelvis were obtained; CTA did not demonstrate any evidence of PE but notable for diffuse bilateral bronchial wall thickening in addition to very fine  centrilobular pulmonary nodules.  CT of the abdomen did not show any abnormalities to explain symptoms.  Due to acute hypoxic respiratory failure, [facility] contacted for admission  Review of Systems: As mentioned in the history of present illness. All other systems reviewed and are negative. Past Medical History:  Diagnosis Date   Actinic keratosis 02/12/2020   R lat calf (hypertrophic)   Arthritis    Cancer of sigmoid ([place]) 06/17/2016   Colon cancer ([place]) 2009   T3,N0; s/p resection  Dr. Hulda Humphrey and chemotherapy by Dr. Cynda Acres   Diastolic dysfunction    a. 08/2020 Echo: EF 60-65%, no rwma, Gr1 DD, nl RV size/fxn. Mild MR.   Hernia of flank    History of actinic keratoses 08/11/2020   Bx proven at left lateral ankle proximal, LN2 10/08/20   History of stress test    a. 09/2020 MV: EF >65%. No ischemia/infarct. Mild Ao Ca2+ and minimal Cor Ca2+.   Hypertension    Neuropathy    Polyp at cervical os    s/p resection Dr. Sabra Heck   Skin cancer 01/22/2020   surface of an atypical verrucous squamous proliferation    Squamous cell carcinoma of skin 07/16/2020   Left lateral ankle anterior, treated with [facility] 08-11-2020   Past Surgical History:  Procedure Laterality Date   BREAST BIOPSY Right 05/2014   Dr. Dwyane Luo office-benign   BREAST LUMPECTOMY WITH SENTINEL LYMPH NODE BIOPSY Right 01/21/2022   Procedure: BREAST LUMPECTOMY WITH SENTINEL LYMPH NODE BX;  Surgeon: Robert Bellow, MD;  Location: [facility] ORS;  Service: General;  Laterality: Right;   BREAST SURGERY Right February 2016   Vacuum assisted biopsy for recurrent cyst, fibrocystic changes without evidence of malignancy.   CATARACT EXTRACTION W/PHACO Left 01/13/2015   Procedure: CATARACT EXTRACTION PHACO AND INTRAOCULAR LENS PLACEMENT (IOC);  Surgeon: Birder Robson, MD;  Location: [facility] ORS;  Service: Ophthalmology;  Laterality: Left;  [place]  1:02.9 AP   19.2 CDE  12.06 casette lot #  [id number]   CATARACT EXTRACTION W/PHACO Right 02/03/2015    Procedure: CATARACT EXTRACTION PHACO AND INTRAOCULAR LENS PLACEMENT (IOC);  Surgeon: Birder Robson, MD;  Location: [facility] ORS;  Service: Ophthalmology;  Laterality: Right;  [id number] ap46.5 [id number]   COLECTOMY     COLONOSCOPY  2015   Dr Candace Cruise   COLONOSCOPY WITH PROPOFOL N/A 09/13/2017   Procedure: COLONOSCOPY WITH PROPOFOL;  Surgeon: Robert Bellow, MD;  Location: [facility] ENDOSCOPY;  Service: Endoscopy;  Laterality: N/A;   COLONOSCOPY WITH PROPOFOL N/A 11/08/2017   Procedure: COLONOSCOPY WITH PROPOFOL;  Surgeon: Robert Bellow, MD;  Location: [facility] ENDOSCOPY;  Service: Endoscopy;  Laterality: N/A;   colostomy reversal     DILATION AND CURETTAGE OF UTERUS  2014   Dr. Sabra Heck, benign per pt   EYE SURGERY     HERNIA REPAIR  07/13/12   ventral    SKIN CANCER EXCISION     TONSILLECTOMY     Social History:  reports that she quit smoking about 14 years ago. Her smoking use included cigarettes. She has never used smokeless tobacco. She reports current alcohol use of about 1.0 standard drink of alcohol per week. She reports that she does not use drugs.  Allergies  Allergen Reactions   Sulfa Antibiotics Hives    As a child    Family History  Problem Relation Age of Onset   Stroke Mother    Diabetes Mother    Cancer Father        colon   Stroke Sister    Cancer Brother        colon   Cancer Brother        brain    Prior to Admission medications   Medication Sig Start Date End Date Taking? Authorizing Provider  atorvastatin (LIPITOR) 10 MG tablet Take 1 tablet (10 mg total) by mouth daily. 04/11/22  Yes Leone Haven, MD  Calcium Carbonate-Vit D-Min (CALCIUM 1200 PO) Take 1 tablet by mouth daily.   Yes [provider]  clobetasol cream (TEMOVATE) 0.05 % Apply twice daily to affected areas with rash up to 2 weeks. Avoid applying to face, groin, and axilla. Patient taking differently: Apply 1 Application topically 2 (two) times daily as needed (rash). Avoid applying to face,  groin, and axilla. 03/31/22  Yes Moye, [place], MD  hydrochlorothiazide (HYDRODIURIL) 12.5 MG tablet TAKE ONE TABLET BY MOUTH DAILY Patient taking differently: Take 12.5 mg by mouth daily. 01/06/22  Yes Leone Haven, MD  levofloxacin (LEVAQUIN) 500 MG tablet Take 1 tablet (500 mg total) by mouth daily. 05/10/22  Yes [place], Judson Roch M, PA-C  niacinamide 500 MG tablet Take 500 mg by mouth 2 (two) times daily. 10/26/20  Yes [provider]  Omega-3 Fatty Acids (FISH OIL PO) Take 1 capsule by mouth daily.   Yes [provider]  ondansetron (ZOFRAN) 8 MG tablet One pill every 8 hours as needed for nausea/vomitting. 02/14/22  Yes Cammie Sickle, MD  prochlorperazine (COMPAZINE) 10 MG tablet Take 1 tablet (10 mg total) by  mouth every 6 (six) hours as needed for nausea or vomiting. 02/14/22  Yes Cammie Sickle, MD  Ruxolitinib Phosphate (OPZELURA) 1.5 % CREA Apply 1 application topically daily. Patient taking differently: Apply 1 application  topically daily. LEGS 06/23/21  Yes Moye, [place], MD  VITAMIN D, CHOLECALCIFEROL, PO Take 2,000 Units by mouth daily.   Yes [provider]    Physical Exam: Vitals:   05/12/22 1615 05/12/22 1645 05/12/22 1700 05/12/22 1729  BP: 99/88 (!) 110/59 (!) 102/57 106/60  Pulse: (!) 144 (!) 136 (!) 133 (!) 126  Resp: (!) 26 (!) 34 (!) 33 (!) 25  Temp:    98.7 F (37.1 C)  TempSrc:    Oral  SpO2: 99% 100% 100% 99%  Weight:    80.3 kg  Height:    '5\' 6"'$  (1.676 m)   Physical Exam Vitals and nursing note reviewed.  Constitutional:      General: She is not in acute distress.    Appearance: She is normal weight. She is ill-appearing.  HENT:     Head: Normocephalic and atraumatic.  Cardiovascular:     Rate and Rhythm: Regular rhythm. Tachycardia present.     Heart sounds: No murmur heard. Pulmonary:     Effort: Tachypnea and accessory muscle usage present.     Breath sounds: Wheezing (Diffuse expiratory wheezing) and  rhonchi (Intermittent rhonchi diffusely) present. No rales.  Abdominal:     Palpations: Abdomen is soft.     Tenderness: There is no abdominal tenderness. There is no guarding.  Musculoskeletal:     Right lower leg: 1+ Pitting Edema present.     Left lower leg: 1+ Pitting Edema present.  Skin:    General: Skin is warm and dry.  Neurological:     General: No focal deficit present.     Mental Status: She is alert and oriented to person, place, and time.  Psychiatric:        Mood and Affect: Mood normal.        Behavior: Behavior normal.    Data Reviewed: CBC with WBC of 29.7, hemoglobin of 10.6, MCV of 100.9 and platelets of 283.  Differential demonstrated neutrophilic predominance CMP with sodium of 130, potassium 3.4, chloride 93, bicarb of 27, glucose of 134, BUN of 10, creatinine 0.75, calcium of 9.6, anion gap 10, magnesium 1.9, AST 23, ALT 14, GFR over 60 Initial lactic acid 2.1 Initial troponin 26 BNP elevated at 379 Urinalysis with trace leukocytes, but no bacteria.  21-50 WBCs/hpf COVID-19, RSV and influenza PCR negative VBG with pCO2 of 56  EKG personally reviewed.  Sinus rhythm with frequent PVCs. Telemetry reviewed.  Sinus tachycardia.  Rates predominantly in the 130s.  CT Abdomen Pelvis W Contrast  Result Date: 05/12/2022 CLINICAL DATA:  Abdominal pain EXAM: CT ABDOMEN AND PELVIS WITH CONTRAST TECHNIQUE: Multidetector CT imaging of the abdomen and pelvis was performed using the standard protocol following bolus administration of intravenous contrast. RADIATION DOSE REDUCTION: This exam was performed according to the departmental dose-optimization program which includes automated exposure control, adjustment of the mA and/or kV according to patient size and/or use of iterative reconstruction technique. CONTRAST:  167m OMNIPAQUE IOHEXOL 350 MG/ML SOLN COMPARISON:  06/12/2012 FINDINGS: Examination of the abdomen pelvis is generally limited by breath motion artifact. Lower  chest: Please see separately reported examination of the chest. Hepatobiliary: No solid liver abnormality is seen. Tiny gallstones and or sludge (series 2, image 32). No gallbladder wall thickening, or biliary dilatation. Pancreas:  Unremarkable. No pancreatic ductal dilatation or surrounding inflammatory changes. Spleen: Normal in size without significant abnormality. Adrenals/Urinary Tract: Adrenal glands are unremarkable. Kidneys are normal, without renal calculi, solid lesion, or hydronephrosis. Bladder is unremarkable. Stomach/Bowel: Stomach is within normal limits. Appendix appears normal. No evidence of bowel wall thickening, distention, or inflammatory changes. Evidence of prior sigmoid colon resection and reanastomosis. Vascular/Lymphatic: Aortic atherosclerosis. No enlarged abdominal or pelvic lymph nodes. Reproductive: Calcified uterine fibroids. Right ovarian cyst measuring 3.4 x 2.2 cm (series 2, image 67). This is unchanged compared to remote prior examination dated 2014 and definitively benign. No further follow-up or characterization is required. No follow-up imaging recommended. Note: This recommendation does not apply to premenarchal patients and to those with increased risk (genetic, family history, elevated tumor markers or other high-risk factors) of ovarian cancer. Reference: JACR 2020 Feb; 17(2):248-254 Other: No abdominal wall hernia or abnormality. No ascites. Musculoskeletal: No acute or significant osseous findings. IMPRESSION: 1. Examination of the abdomen and pelvis is generally limited by breath motion artifact. 2. Within this limitation, no acute CT findings of the abdomen or pelvis to explain abdominal pain. 3. Tiny gallstones and or sludge. No evidence of acute cholecystitis. 4. Calcified uterine fibroids. Aortic Atherosclerosis (ICD10-I70.0). Electronically Signed   By: Delanna Ahmadi M.D.   On: 05/12/2022 14:40   CT Angio Chest PE W/Cm &/Or Wo Cm  Result Date: 05/12/2022 CLINICAL  DATA:  PE suspected, shortness of breath increasing for 1 week EXAM: CT ANGIOGRAPHY CHEST WITH CONTRAST TECHNIQUE: Multidetector CT imaging of the chest was performed using the standard protocol during bolus administration of intravenous contrast. Multiplanar CT image reconstructions and MIPs were obtained to evaluate the vascular anatomy. RADIATION DOSE REDUCTION: This exam was performed according to the departmental dose-optimization program which includes automated exposure control, adjustment of the mA and/or kV according to patient size and/or use of iterative reconstruction technique. CONTRAST:  170m OMNIPAQUE IOHEXOL 350 MG/ML SOLN COMPARISON:  None Available. FINDINGS: Cardiovascular: Satisfactory opacification of the pulmonary arteries to the segmental level. No evidence of pulmonary embolism. Normal heart size. Right coronary artery calcifications. No pericardial effusion. Aortic atherosclerosis. Mediastinum/Nodes: No enlarged mediastinal, hilar, or axillary lymph nodes. Thyroid gland, trachea, and esophagus demonstrate no significant findings. Lungs/Pleura: Diffuse bilateral bronchial wall thickening. Background of very fine centrilobular pulmonary nodules. Bandlike atelectasis or consolidation of the posterior lingula (series 5, 41). No pleural effusion or pneumothorax. Upper Abdomen: Please see separately reported examination of the abdomen and pelvis. Musculoskeletal: No chest wall abnormality. No acute osseous findings. Review of the MIP images confirms the above findings. IMPRESSION: 1. Negative examination for pulmonary embolism. 2. Diffuse bilateral bronchial wall thickening, consistent with nonspecific infectious or inflammatory bronchitis. 3. Background of very fine centrilobular pulmonary nodules, most commonly seen in smoking-related respiratory bronchiolitis although generally nonspecific and infectious or inflammatory. 4. Coronary artery disease. Aortic Atherosclerosis (ICD10-I70.0).  Electronically Signed   By: ADelanna AhmadiM.D.   On: 05/12/2022 14:34   DG Chest Port 1 View  Result Date: 05/12/2022 CLINICAL DATA:  Shortness of breath for 1 week following chemotherapy EXAM: PORTABLE CHEST 1 VIEW COMPARISON:  Chest radiograph 08/18/2020 FINDINGS: The heart is at the upper limits of normal for size. The mediastinal contours are normal. There is no focal consolidation or pulmonary edema. There is no pleural effusion or pneumothorax There is no acute osseous abnormality. IMPRESSION: No radiographic evidence of acute cardiopulmonary process. Electronically Signed   By: PValetta MoleM.D.   On: 05/12/2022 13:04  Results are pending, will review when available.  Assessment and Plan: * Acute hypoxic respiratory failure ([place]) Patient is presenting with several week history of gradually worsening shortness of breath and productive cough with significantly increased work of breathing on examination in addition to diffuse wheezing and rhonchi.  Given extensive smoking history, this is concerning for COPD exacerbation although she has never been formally diagnosed with COPD.  CT imaging with evidence of bronchitis, but no evidence of pneumonia.  Although BNP is elevated and patient has lower extremity edema, no evidence of pulmonary edema or vascular congestion on imaging.  She did get fluids in the ED though, so we will monitor closely for evidence of developing pulmonary edema and give Lasix at that time.  No evidence of sepsis at this time, however patient is certainly at very high risk  - Continue BiPAP due to increased work of breathing - Solu-Medrol 125 mg once - Start prednisone 40 mg tomorrow to complete a 5-day course - Continue Ceftriaxone and azithromycin - DuoNebs every 6 hours - RVP  - Expectorated sputum culture - Blood and urine cultures  Demand ischemia Minimal elevation in troponin in the setting of demand.  Although PVCs noted on EKG, also likely in the setting of  respiratory disease.  No ST or T wave changes to suggest ischemia.  Patient is chest pain-free.  - Repeat troponin pending  Carcinoma of overlapping sites of right breast in female, estrogen receptor positive [facility]) Patient has recently completed 4 courses of Taxotere-cytotoxin with plans to initiate radiation therapy shortly.  No evidence of pneumonitis on imaging.  Hypertension Patient's blood pressure has slowly trended down during admission, so we will hold home hydrochlorothiazide for now  Advance Care Planning:   Code Status: Full Code verified by patient  Consults: None  Family Communication: Patient's husband updated at bedside  Severity of Illness: The appropriate patient status for this patient is INPATIENT. Inpatient status is judged to be reasonable and necessary in order to provide the required intensity of service to ensure the patient's safety. The patient's presenting symptoms, physical exam findings, and initial radiographic and laboratory data in the context of their chronic comorbidities is felt to place them at high risk for further clinical deterioration. Furthermore, it is not anticipated that the patient will be medically stable for discharge from the hospital within 2 midnights of admission.   * I certify that at the point of admission it is my clinical judgment that the patient will require inpatient hospital care spanning beyond 2 midnights from the point of admission due to high intensity of service, high risk for further deterioration and high frequency of surveillance required.*  Author: Jose Persia, MD 05/12/2022 6:00 PM  For on call review [email or web address].

## 2022-05-12 NOTE — Assessment & Plan Note (Signed)
Minimal elevation in troponin in the setting of demand.  Although PVCs noted on EKG, also likely in the setting of respiratory disease.  No ST or T wave changes to suggest ischemia.  Patient is chest pain-free.  - Repeat troponin pending

## 2022-05-12 NOTE — Telephone Encounter (Signed)
Patient called reporting that she is having difficulty with mucous and breathing. Her O2 sat is 86 % temp is 100.4 now. She has not been able to get her O2 sats above 86 % since last evening. This started last night and she could not sleep well last night She is purse lip breathing on phone She does not have O2 at home nor a breathing machine or inhalers. She reports that she has had problems with morning mucous, but that she can usually get it up and out by now.Please advise

## 2022-05-12 NOTE — Telephone Encounter (Signed)
Call returned to patient and advised that she needs to go to the ER. She did not want to go and sit there and wait, but She finally said that she would go and see how bad the wait is after telling her that she can go to any ER so long as she goes.

## 2022-05-13 ENCOUNTER — Encounter: Payer: Self-pay | Admitting: Internal Medicine

## 2022-05-13 DIAGNOSIS — A419 Sepsis, unspecified organism: Secondary | ICD-10-CM

## 2022-05-13 DIAGNOSIS — R6520 Severe sepsis without septic shock: Secondary | ICD-10-CM | POA: Diagnosis not present

## 2022-05-13 DIAGNOSIS — J189 Pneumonia, unspecified organism: Secondary | ICD-10-CM | POA: Diagnosis not present

## 2022-05-13 DIAGNOSIS — J9601 Acute respiratory failure with hypoxia: Secondary | ICD-10-CM

## 2022-05-13 LAB — COMPREHENSIVE METABOLIC PANEL
ALT: 15 U/L (ref 0–44)
AST: 42 U/L — ABNORMAL HIGH (ref 15–41)
Albumin: 2.9 g/dL — ABNORMAL LOW (ref 3.5–5.0)
Alkaline Phosphatase: 78 U/L (ref 38–126)
Anion gap: 10 (ref 5–15)
BUN: 10 mg/dL (ref 8–23)
CO2: 24 mmol/L (ref 22–32)
Calcium: 8.8 mg/dL — ABNORMAL LOW (ref 8.9–10.3)
Chloride: 100 mmol/L (ref 98–111)
Creatinine, Ser: 0.9 mg/dL (ref 0.44–1.00)
GFR, Estimated: >60 mL/min (ref >60)
Glucose, Bld: 263 mg/dL — ABNORMAL HIGH (ref 70–99)
Potassium: 3.1 mmol/L — ABNORMAL LOW (ref 3.5–5.1)
Sodium: 134 mmol/L — ABNORMAL LOW (ref 135–145)
Total Bilirubin: 0.4 mg/dL (ref 0.3–1.2)
Total Protein: 5.8 g/dL — ABNORMAL LOW (ref 6.5–8.1)

## 2022-05-13 LAB — CBC WITH DIFFERENTIAL/PLATELET
Abs Immature Granulocytes: 2.78 10*3/uL — ABNORMAL HIGH (ref 0.00–0.07)
Basophils Absolute: 0 10*3/uL (ref 0.0–0.1)
Basophils Relative: 0 %
Eosinophils Absolute: 0 10*3/uL (ref 0.0–0.5)
Eosinophils Relative: 0 %
HCT: 28.4 % — ABNORMAL LOW (ref 36.0–46.0)
Hemoglobin: 9 g/dL — ABNORMAL LOW (ref 12.0–15.0)
Immature Granulocytes: 9 %
Lymphocytes Relative: 3 %
Lymphs Abs: 0.9 10*3/uL (ref 0.7–4.0)
MCH: 32.8 pg (ref 26.0–34.0)
MCHC: 31.7 g/dL (ref 30.0–36.0)
MCV: 103.6 fL — ABNORMAL HIGH (ref 80.0–100.0)
Monocytes Absolute: 0.7 10*3/uL (ref 0.1–1.0)
Monocytes Relative: 2 %
Neutro Abs: 26.3 10*3/uL — ABNORMAL HIGH (ref 1.7–7.7)
Neutrophils Relative %: 86 %
Platelets: 240 10*3/uL (ref 150–400)
RBC: 2.74 MIL/uL — ABNORMAL LOW (ref 3.87–5.11)
RDW: 15 % (ref 11.5–15.5)
Smear Review: NORMAL
WBC: 30.7 10*3/uL — ABNORMAL HIGH (ref 4.0–10.5)
nRBC: 0.1 % (ref 0.0–0.2)

## 2022-05-13 LAB — HIV ANTIBODY (ROUTINE TESTING W REFLEX): HIV Screen 4th Generation wRfx: NONREACTIVE

## 2022-05-13 LAB — LACTIC ACID, PLASMA
Lactic Acid, Venous: 3.9 mmol/L (ref 0.5–1.9)
Lactic Acid, Venous: 1.9 mmol/L (ref 0.5–1.9)

## 2022-05-13 LAB — URINE CULTURE: Culture: NO GROWTH

## 2022-05-13 LAB — MAGNESIUM: Magnesium: 1.8 mg/dL (ref 1.7–2.4)

## 2022-05-13 MED ORDER — IPRATROPIUM-ALBUTEROL 0.5-2.5 (3) MG/3ML IN SOLN
3.0000 mL | Freq: Three times a day (TID) | RESPIRATORY_TRACT | Status: DC
  Administered 2022-05-14 – 2022-05-16 (×7): 3 mL via RESPIRATORY_TRACT
  Filled 2022-05-13 (×7): qty 3

## 2022-05-13 MED ORDER — POTASSIUM CHLORIDE CRYS ER 20 MEQ PO TBCR
40.0000 meq | EXTENDED_RELEASE_TABLET | Freq: Once | ORAL | Status: AC
  Administered 2022-05-13: 40 meq via ORAL
  Filled 2022-05-13: qty 2

## 2022-05-13 NOTE — ED Notes (Signed)
Called RN Shift report number twice with no response, CN notified

## 2022-05-13 NOTE — Progress Notes (Addendum)
Progress Note    Sabrina Wilson  EZM:[id number] DOB: 1940-07-25  DOA: 05/12/2022 PCP: Leone Haven, MD      Brief Narrative:    Medical records reviewed and are as summarized below:  Sabrina Wilson is a 82 y.o. female  with medical history significant of stage II breast cancer on Taxotere/Cytoxin, colon cancer s/p colectomy, chronic diastolic CHF, hypertension, who presented to the hospital with shortness of breath for about a week since her last chemotherapy infusion on 04/11/2022.  She also complained of productive cough and fever at home.  She also reported nonbloody diarrhea-started about a week ago.  She started taking Levaquin on 05/09/2022 and took it for 3 days.  She presented to the hospital because of worsening shortness of breath and cough.  Diarrhea had resolved prior to admission.  She has a 30-pack-year smoking history.    She was febrile in the emergency room with temperature of 100.6 F, tachypneic, tachycardic, hypoxic with oxygen saturation of 88% on room air.  Lactic acid was 2.4 and went up to 3.9.  She also had leukocytosis with WBC of 29.7.        Assessment/Plan:   Principal Problem:   Acute hypoxic respiratory failure (HCC) Active Problems:   Demand ischemia   Carcinoma of overlapping sites of right breast in female, estrogen receptor positive ([place])   Hypertension   Bilateral pneumonia   Severe sepsis ([place])   Body mass index is 28.57 kg/m.    Severe sepsis secondary to bilateral pneumonia, leukocytosis immunocompromised state: Continue empiric IV antibiotics.  Follow-up blood cultures.  Check urine antigen for Legionella and strep pneumo.   Acute hypoxic respiratory failure: She is off of BiPAP.  She is tolerating 3 L/min oxygen via nasal cannula.  Taper off oxygen as able. Suspected COPD exacerbation.  No prior diagnosis of COPD.  Continue bronchodilators and prednisone.   Elevated troponins: This is likely demand ischemia.  No  chest pain.   Hypokalemia: Replete potassium.  Check magnesium level and replete as needed   Carcinoma of overlapping sites of right breast in female, estrogen receptor positive [facility]): Recently completed 4 courses of Taxotere-cytotoxin with plans to initiate radiation therapy shortly    Mildly elevated BNP: 2D echo in May 2022 showed EF estimated at 60 to 50%, grade 1 diastolic dysfunction    Diet Order             Diet Heart Room service appropriate? Yes; Fluid consistency: Thin  Diet effective now                            Consultants: None  Procedures: None    Medications:    atorvastatin  10 mg Oral Daily   benzonatate  200 mg Oral TID   enoxaparin (LOVENOX) injection  40 mg Subcutaneous Q24H   guaiFENesin  600 mg Oral BID   ipratropium-albuterol  3 mL Nebulization Q6H   niacin  500 mg Oral QHS   predniSONE  40 mg Oral Q breakfast   Continuous Infusions:  azithromycin Stopped (05/12/22 1728)   cefTRIAXone (ROCEPHIN)  IV Stopped (05/12/22 2200)     Anti-infectives (From admission, onward)    Start     Dose/Rate Route Frequency Ordered Stop   05/12/22 2200  cefTRIAXone (ROCEPHIN) 2 g in sodium chloride 0.9 % 100 mL IVPB        2 g 200 mL/hr over 30 Minutes  Intravenous Every 24 hours 05/12/22 1654     05/12/22 1600  azithromycin (ZITHROMAX) 500 mg in sodium chloride 0.9 % 250 mL IVPB        500 mg 250 mL/hr over 60 Minutes Intravenous Every 24 hours 05/12/22 1530     05/12/22 1330  vancomycin (VANCOREADY) IVPB 1750 mg/350 mL  Status:  Discontinued        1,750 mg 175 mL/hr over 120 Minutes Intravenous  Once 05/12/22 1327 05/12/22 1530   05/12/22 1300  ceFEPIme (MAXIPIME) 2 g in sodium chloride 0.9 % 100 mL IVPB        2 g 200 mL/hr over 30 Minutes Intravenous  Once 05/12/22 1248 05/12/22 1517   05/12/22 1300  metroNIDAZOLE (FLAGYL) IVPB 500 mg  Status:  Discontinued        500 mg 100 mL/hr over 60 Minutes Intravenous  Once 05/12/22 1248  05/12/22 1530              Family Communication/Anticipated D/C date and plan/Code Status   DVT prophylaxis: enoxaparin (LOVENOX) injection 40 mg Start: 05/12/22 2200     Code Status: Full Code  Family Communication: Plan discussed with her husband at bedside Disposition Plan: Plan to discharge home in 2 to 3 days   Status is: Inpatient Remains inpatient appropriate because: IV antibiotics       Subjective:   Interval events noted.  She complains of dry cough.  She still feels a little short of breath but is better.  She was wheezing yesterday but not today.  No diarrhea.  Her husband was at the bedside  Objective:    Vitals:   05/13/22 0500 05/13/22 0700 05/13/22 0800 05/13/22 1231  BP: 110/62 (!) 98/57 112/68   Pulse: 85 80 86   Resp: (!) 21 (!) 23 (!) 24   Temp:    98.5 F (36.9 C)  TempSrc:    Oral  SpO2: 98% 98% 99%   Weight:      Height:       No data found.   Intake/Output Summary (Last 24 hours) at 05/13/2022 1336 Last data filed at 05/12/2022 2200 Gross per 24 hour  Intake 1850 ml  Output --  Net 1850 ml   Filed Weights   05/12/22 1729  Weight: 80.3 kg    Exam:   GEN: NAD SKIN: Warm and dry EYES: No pallor or icterus ENT: MMM CV: RRR PULM: Bibasilar rales, no wheezing ABD: soft, obese, NT, +BS CNS: AAO x 3, non focal EXT: No edema or tenderness      Data Reviewed:   I have personally reviewed following labs and imaging studies:  Labs: Labs show the following:   Basic Metabolic Panel: Recent Labs  Lab 05/09/22 1006 05/11/22 0944 05/12/22 1334 05/13/22 0420  NA 131* 131* 130* 134*  K 3.6 3.6 3.4* 3.1*  CL 92* 93* 93* 100  CO2 '28 28 27 24  '$ GLUCOSE 120* 122* 134* 263*  BUN '20 10 10 10  '$ CREATININE 0.77 0.82 0.75 0.90  CALCIUM 9.2 9.3 9.6 8.8*  MG  --  1.5* 1.9  --    GFR Estimated Creatinine Clearance: 54.2 mL/min (by C-G formula based on SCr of 0.9 mg/dL). Liver Function Tests: Recent Labs  Lab  05/11/22 0944 05/12/22 1334 05/13/22 0420  AST 24 23 42*  ALT '13 14 15  '$ ALKPHOS 74 97 78  BILITOT 0.7 0.6 0.4  PROT 6.3* 6.8 5.8*  ALBUMIN 3.1* 3.4* 2.9*  No results for input(s): "LIPASE", "AMYLASE" in the last 168 hours. No results for input(s): "AMMONIA" in the last 168 hours. Coagulation profile No results for input(s): "INR", "PROTIME" in the last 168 hours.  CBC: Recent Labs  Lab 05/09/22 1006 05/11/22 0944 05/12/22 1334 05/13/22 0420  WBC 3.3* 25.0* 29.7* 30.7*  NEUTROABS 1.1* 18.1* 22.0* 26.3*  HGB 10.0* 10.2* 10.6* 9.0*  HCT 29.7* 30.5* 32.8* 28.4*  MCV 99.7 101.0* 100.9* 103.6*  PLT 237 257 283 240   Cardiac Enzymes: No results for input(s): "CKTOTAL", "CKMB", "CKMBINDEX", "TROPONINI" in the last 168 hours. BNP (last 3 results) No results for input(s): "PROBNP" in the last 8760 hours. CBG: No results for input(s): "GLUCAP" in the last 168 hours. D-Dimer: No results for input(s): "DDIMER" in the last 72 hours. Hgb A1c: No results for input(s): "HGBA1C" in the last 72 hours. Lipid Profile: No results for input(s): "CHOL", "HDL", "LDLCALC", "TRIG", "CHOLHDL", "LDLDIRECT" in the last 72 hours. Thyroid function studies: No results for input(s): "TSH", "T4TOTAL", "T3FREE", "THYROIDAB" in the last 72 hours.  Invalid input(s): "FREET3" Anemia work up: No results for input(s): "VITAMINB12", "FOLATE", "FERRITIN", "TIBC", "IRON", "RETICCTPCT" in the last 72 hours. Sepsis Labs: Recent Labs  Lab 05/09/22 1006 05/11/22 0944 05/12/22 1334 05/13/22 0420  WBC 3.3* 25.0* 29.7* 30.7*  LATICACIDVEN  --   --  2.4*  2.1* 3.9*    Microbiology Recent Results (from the past 240 hour(s))  Resp panel by RT-PCR (RSV, Flu A&B, Covid) Anterior Nasal Swab     Status: None   Collection Time: 05/12/22 12:49 PM   Specimen: Anterior Nasal Swab  Result Value Ref Range Status   SARS Coronavirus 2 by RT PCR NEGATIVE NEGATIVE Final    Comment: (NOTE) SARS-CoV-2 target nucleic  acids are NOT DETECTED.  The SARS-CoV-2 RNA is generally detectable in upper respiratory specimens during the acute phase of infection. The lowest concentration of SARS-CoV-2 viral copies this assay can detect is 138 copies/mL. A negative result does not preclude SARS-Cov-2 infection and should not be used as the sole basis for treatment or other patient management decisions. A negative result may occur with  improper specimen collection/handling, submission of specimen other than nasopharyngeal swab, presence of viral mutation(s) within the areas targeted by this assay, and inadequate number of viral copies(<138 copies/mL). A negative result must be combined with clinical observations, patient history, and epidemiological information. The expected result is Negative.  Fact Sheet for Patients:  EntrepreneurPulse.com.au  Fact Sheet for Healthcare Providers:  IncredibleEmployment.be  This test is no t yet approved or cleared by the [place] FDA and  has been authorized for detection and/or diagnosis of SARS-CoV-2 by FDA under an Emergency Use Authorization (EUA). This EUA will remain  in effect (meaning this test can be used) for the duration of the COVID-19 declaration under Section 564(b)(1) of the Act, 21 U.S.C.section 360bbb-3(b)(1), unless the authorization is terminated  or revoked sooner.       Influenza A by PCR NEGATIVE NEGATIVE Final   Influenza B by PCR NEGATIVE NEGATIVE Final    Comment: (NOTE) The Xpert Xpress SARS-CoV-2/FLU/RSV plus assay is intended as an aid in the diagnosis of influenza from Nasopharyngeal swab specimens and should not be used as a sole basis for treatment. Nasal washings and aspirates are unacceptable for Xpert Xpress SARS-CoV-2/FLU/RSV testing.  Fact Sheet for Patients: EntrepreneurPulse.com.au  Fact Sheet for Healthcare Providers: IncredibleEmployment.be  This  test is not yet approved or cleared by the [place]  FDA and has been authorized for detection and/or diagnosis of SARS-CoV-2 by FDA under an Emergency Use Authorization (EUA). This EUA will remain in effect (meaning this test can be used) for the duration of the COVID-19 declaration under Section 564(b)(1) of the Act, 21 U.S.C. section 360bbb-3(b)(1), unless the authorization is terminated or revoked.     Resp Syncytial Virus by PCR NEGATIVE NEGATIVE Final    Comment: (NOTE) Fact Sheet for Patients: EntrepreneurPulse.com.au  Fact Sheet for Healthcare Providers: IncredibleEmployment.be  This test is not yet approved or cleared by the [place] FDA and has been authorized for detection and/or diagnosis of SARS-CoV-2 by FDA under an Emergency Use Authorization (EUA). This EUA will remain in effect (meaning this test can be used) for the duration of the COVID-19 declaration under Section 564(b)(1) of the Act, 21 U.S.C. section 360bbb-3(b)(1), unless the authorization is terminated or revoked.  Performed at [facility], [address] [place], [place] [address]   Blood Culture (routine x 2)     Status: None (Preliminary result)   Collection Time: 05/12/22 12:49 PM   Specimen: BLOOD  Result Value Ref Range Status   Specimen Description BLOOD LEFT ANTECUBITAL  Final   Special Requests BOTTLES DRAWN AEROBIC AND ANAEROBIC BCLV  Final   Culture   Final    NO GROWTH < 24 HOURS Performed at [facility], [address] [place], [place] [address]    Report Status PENDING  Incomplete  Blood Culture (routine x 2)     Status: None (Preliminary result)   Collection Time: 05/12/22 12:49 PM   Specimen: BLOOD  Result Value Ref Range Status   Specimen Description BLOOD LEFT HAND  Final   Special Requests BOTTLES DRAWN AEROBIC AND ANAEROBIC BCAV  Final   Culture   Final    NO GROWTH < 24 HOURS Performed at [facility], [address] [place], [place] [address]    Report Status PENDING  Incomplete  Expectorated Sputum Assessment w Gram Stain, Rflx to Resp Cult     Status: None   Collection Time: 05/12/22  5:33 PM   Specimen: Expectorated Sputum  Result Value Ref Range Status   Specimen Description EXPECTORATED SPUTUM  Final   Special Requests NONE  Final   Sputum evaluation   Final    THIS SPECIMEN IS ACCEPTABLE FOR SPUTUM CULTURE Performed at [facility], [address] [place], [place] [address]    Report Status 05/12/2022 FINAL  Final  Respiratory (~20 pathogens) panel by PCR     Status: None   Collection Time: 05/12/22  5:33 PM   Specimen: Nasopharyngeal Swab; Respiratory  Result Value Ref Range Status   Adenovirus NOT DETECTED NOT DETECTED Final   Coronavirus 229E NOT DETECTED NOT DETECTED Final    Comment: (NOTE) The Coronavirus on the Respiratory Panel, DOES NOT test for the novel  Coronavirus (2019 nCoV)    Coronavirus HKU1 NOT DETECTED NOT DETECTED Final   Coronavirus NL63 NOT DETECTED NOT DETECTED Final   Coronavirus OC43 NOT DETECTED NOT DETECTED Final   Metapneumovirus NOT DETECTED NOT DETECTED Final   Rhinovirus / Enterovirus NOT DETECTED NOT DETECTED Final   Influenza A NOT DETECTED NOT DETECTED Final   Influenza B NOT DETECTED NOT DETECTED Final   Parainfluenza Virus 1 NOT DETECTED NOT DETECTED Final   Parainfluenza Virus 2 NOT DETECTED NOT DETECTED Final   Parainfluenza Virus 3 NOT DETECTED NOT DETECTED Final   Parainfluenza Virus 4 NOT DETECTED  NOT DETECTED Final   Respiratory Syncytial Virus NOT DETECTED NOT DETECTED Final   Bordetella pertussis NOT DETECTED NOT DETECTED Final   Bordetella Parapertussis NOT DETECTED NOT DETECTED Final   Chlamydophila pneumoniae NOT DETECTED NOT DETECTED Final   Mycoplasma pneumoniae NOT DETECTED NOT DETECTED Final    Comment: Performed at [facility], [place] [address] [place], [place] [address]  Culture, Respiratory  w Gram Stain     Status: None (Preliminary result)   Collection Time: 05/12/22  5:33 PM  Result Value Ref Range Status   Specimen Description   Final    EXPECTORATED SPUTUM Performed at [facility], [address] [place], [place] [address]    Special Requests   Final    NONE Reflexed from [id number] Performed at [place], [place] [address]    Gram Stain   Final    MODERATE WBC PRESENT,BOTH PMN AND MONONUCLEAR MODERATE GRAM POSITIVE COCCI IN PAIRS FEW GRAM NEGATIVE RODS RARE SQUAMOUS EPITHELIAL CELLS PRESENT    Culture   Final    TOO YOUNG TO READ Performed at [facility], [place] [address], [place], [place] [address]    Report Status PENDING  Incomplete    Procedures and diagnostic studies:  CT Abdomen Pelvis W Contrast  Result Date: 05/12/2022 CLINICAL DATA:  Abdominal pain EXAM: CT ABDOMEN AND PELVIS WITH CONTRAST TECHNIQUE: Multidetector CT imaging of the abdomen and pelvis was performed using the standard protocol following bolus administration of intravenous contrast. RADIATION DOSE REDUCTION: This exam was performed according to the departmental dose-optimization program which includes automated exposure control, adjustment of the mA and/or kV according to patient size and/or use of iterative reconstruction technique. CONTRAST:  135m OMNIPAQUE IOHEXOL 350 MG/ML SOLN COMPARISON:  06/12/2012 FINDINGS: Examination of the abdomen pelvis is generally limited by breath motion artifact. Lower chest: Please see separately reported examination of the chest. Hepatobiliary: No solid liver abnormality is seen. Tiny gallstones and or sludge (series 2, image 32). No gallbladder wall thickening, or biliary dilatation. Pancreas: Unremarkable. No pancreatic ductal dilatation or surrounding inflammatory changes. Spleen: Normal in size without significant abnormality. Adrenals/Urinary Tract: Adrenal glands are unremarkable. Kidneys are normal,  without renal calculi, solid lesion, or hydronephrosis. Bladder is unremarkable. Stomach/Bowel: Stomach is within normal limits. Appendix appears normal. No evidence of bowel wall thickening, distention, or inflammatory changes. Evidence of prior sigmoid colon resection and reanastomosis. Vascular/Lymphatic: Aortic atherosclerosis. No enlarged abdominal or pelvic lymph nodes. Reproductive: Calcified uterine fibroids. Right ovarian cyst measuring 3.4 x 2.2 cm (series 2, image 67). This is unchanged compared to remote prior examination dated 2014 and definitively benign. No further follow-up or characterization is required. No follow-up imaging recommended. Note: This recommendation does not apply to premenarchal patients and to those with increased risk (genetic, family history, elevated tumor markers or other high-risk factors) of ovarian cancer. Reference: JACR 2020 Feb; 17(2):248-254 Other: No abdominal wall hernia or abnormality. No ascites. Musculoskeletal: No acute or significant osseous findings. IMPRESSION: 1. Examination of the abdomen and pelvis is generally limited by breath motion artifact. 2. Within this limitation, no acute CT findings of the abdomen or pelvis to explain abdominal pain. 3. Tiny gallstones and or sludge. No evidence of acute cholecystitis. 4. Calcified uterine fibroids. Aortic Atherosclerosis (ICD10-I70.0). Electronically Signed   By: ADelanna AhmadiM.D.   On: 05/12/2022 14:40   CT Angio Chest PE W/Cm &/Or Wo Cm  Result Date: 05/12/2022 CLINICAL DATA:  PE suspected,  shortness of breath increasing for 1 week EXAM: CT ANGIOGRAPHY CHEST WITH CONTRAST TECHNIQUE: Multidetector CT imaging of the chest was performed using the standard protocol during bolus administration of intravenous contrast. Multiplanar CT image reconstructions and MIPs were obtained to evaluate the vascular anatomy. RADIATION DOSE REDUCTION: This exam was performed according to the departmental dose-optimization program  which includes automated exposure control, adjustment of the mA and/or kV according to patient size and/or use of iterative reconstruction technique. CONTRAST:  154m OMNIPAQUE IOHEXOL 350 MG/ML SOLN COMPARISON:  None Available. FINDINGS: Cardiovascular: Satisfactory opacification of the pulmonary arteries to the segmental level. No evidence of pulmonary embolism. Normal heart size. Right coronary artery calcifications. No pericardial effusion. Aortic atherosclerosis. Mediastinum/Nodes: No enlarged mediastinal, hilar, or axillary lymph nodes. Thyroid gland, trachea, and esophagus demonstrate no significant findings. Lungs/Pleura: Diffuse bilateral bronchial wall thickening. Background of very fine centrilobular pulmonary nodules. Bandlike atelectasis or consolidation of the posterior lingula (series 5, 41). No pleural effusion or pneumothorax. Upper Abdomen: Please see separately reported examination of the abdomen and pelvis. Musculoskeletal: No chest wall abnormality. No acute osseous findings. Review of the MIP images confirms the above findings. IMPRESSION: 1. Negative examination for pulmonary embolism. 2. Diffuse bilateral bronchial wall thickening, consistent with nonspecific infectious or inflammatory bronchitis. 3. Background of very fine centrilobular pulmonary nodules, most commonly seen in smoking-related respiratory bronchiolitis although generally nonspecific and infectious or inflammatory. 4. Coronary artery disease. Aortic Atherosclerosis (ICD10-I70.0). Electronically Signed   By: ADelanna AhmadiM.D.   On: 05/12/2022 14:34   DG Chest Port 1 View  Result Date: 05/12/2022 CLINICAL DATA:  Shortness of breath for 1 week following chemotherapy EXAM: PORTABLE CHEST 1 VIEW COMPARISON:  Chest radiograph 08/18/2020 FINDINGS: The heart is at the upper limits of normal for size. The mediastinal contours are normal. There is no focal consolidation or pulmonary edema. There is no pleural effusion or  pneumothorax There is no acute osseous abnormality. IMPRESSION: No radiographic evidence of acute cardiopulmonary process. Electronically Signed   By: PValetta MoleM.D.   On: 05/12/2022 13:04               LOS: 1 day   Courtnee Myer  [facility]   Pager on [email or web address] If 7PM-7AM, please contact night-coverage at [email or web address]     05/13/2022, 1:36 PM

## 2022-05-13 NOTE — ED Notes (Signed)
Lab was contacted ref difficult lab draw.

## 2022-05-13 NOTE — ED Notes (Signed)
Pt stated she has not had a bowel movement since beginning of last week.

## 2022-05-14 DIAGNOSIS — J189 Pneumonia, unspecified organism: Secondary | ICD-10-CM | POA: Diagnosis not present

## 2022-05-14 DIAGNOSIS — Z170 Estrogen receptor positive status [ER+]: Secondary | ICD-10-CM

## 2022-05-14 DIAGNOSIS — C50811 Malignant neoplasm of overlapping sites of right female breast: Secondary | ICD-10-CM

## 2022-05-14 DIAGNOSIS — J9601 Acute respiratory failure with hypoxia: Secondary | ICD-10-CM | POA: Diagnosis not present

## 2022-05-14 DIAGNOSIS — A419 Sepsis, unspecified organism: Secondary | ICD-10-CM | POA: Diagnosis not present

## 2022-05-14 LAB — CBC WITH DIFFERENTIAL/PLATELET
Abs Immature Granulocytes: 2.54 10*3/uL — ABNORMAL HIGH (ref 0.00–0.07)
Basophils Absolute: 0.2 10*3/uL — ABNORMAL HIGH (ref 0.0–0.1)
Basophils Relative: 0 %
Eosinophils Absolute: 0 10*3/uL (ref 0.0–0.5)
Eosinophils Relative: 0 %
HCT: 26.8 % — ABNORMAL LOW (ref 36.0–46.0)
Hemoglobin: 8.7 g/dL — ABNORMAL LOW (ref 12.0–15.0)
Immature Granulocytes: 7 %
Lymphocytes Relative: 3 %
Lymphs Abs: 0.9 10*3/uL (ref 0.7–4.0)
MCH: 32.5 pg (ref 26.0–34.0)
MCHC: 32.5 g/dL (ref 30.0–36.0)
MCV: 100 fL (ref 80.0–100.0)
Monocytes Absolute: 1.6 10*3/uL — ABNORMAL HIGH (ref 0.1–1.0)
Monocytes Relative: 5 %
Neutro Abs: 29.6 10*3/uL — ABNORMAL HIGH (ref 1.7–7.7)
Neutrophils Relative %: 85 %
Platelets: 241 10*3/uL (ref 150–400)
RBC: 2.68 MIL/uL — ABNORMAL LOW (ref 3.87–5.11)
RDW: 15 % (ref 11.5–15.5)
Smear Review: NORMAL
WBC: 34.8 10*3/uL — ABNORMAL HIGH (ref 4.0–10.5)
nRBC: 0.1 % (ref 0.0–0.2)

## 2022-05-14 LAB — BASIC METABOLIC PANEL
Anion gap: 6 (ref 5–15)
BUN: 17 mg/dL (ref 8–23)
CO2: 26 mmol/L (ref 22–32)
Calcium: 9.1 mg/dL (ref 8.9–10.3)
Chloride: 104 mmol/L (ref 98–111)
Creatinine, Ser: 0.8 mg/dL (ref 0.44–1.00)
GFR, Estimated: >60 mL/min (ref >60)
Glucose, Bld: 138 mg/dL — ABNORMAL HIGH (ref 70–99)
Potassium: 3.8 mmol/L (ref 3.5–5.1)
Sodium: 136 mmol/L (ref 135–145)

## 2022-05-14 LAB — MAGNESIUM: Magnesium: 2.1 mg/dL (ref 1.7–2.4)

## 2022-05-14 LAB — MRSA NEXT GEN BY PCR, NASAL: MRSA by PCR Next Gen: NOT DETECTED

## 2022-05-14 MED ORDER — PREDNISONE 20 MG PO TABS: 20.0000 mg | ORAL_TABLET | Freq: Every day | ORAL | Status: DC

## 2022-05-14 MED ORDER — VANCOMYCIN HCL 2000 MG/400ML IV SOLN
2000.0000 mg | Freq: Once | INTRAVENOUS | Status: AC
  Administered 2022-05-14: 2000 mg via INTRAVENOUS
  Filled 2022-05-14: qty 400

## 2022-05-14 MED ORDER — VANCOMYCIN HCL 1500 MG/300ML IV SOLN
1500.0000 mg | INTRAVENOUS | Status: DC
  Filled 2022-05-14: qty 300

## 2022-05-14 MED ORDER — AZITHROMYCIN 500 MG PO TABS
500.0000 mg | ORAL_TABLET | Freq: Every day | ORAL | Status: DC
  Administered 2022-05-14 – 2022-05-16 (×3): 500 mg via ORAL
  Filled 2022-05-14 (×3): qty 1

## 2022-05-14 NOTE — Plan of Care (Signed)
  Problem: Education: Goal: Knowledge of General Education information will improve Description: Including pain rating scale, medication(s)/side effects and non-pharmacologic comfort measures Outcome: Progressing   Problem: Health Behavior/Discharge Planning: Goal: Ability to manage health-related needs will improve Outcome: Progressing   Problem: Clinical Measurements: Goal: Diagnostic test results will improve Outcome: Progressing Goal: Respiratory complications will improve Outcome: Progressing Goal: Cardiovascular complication will be avoided Outcome: Progressing   Problem: Nutrition: Goal: Adequate nutrition will be maintained Outcome: Progressing   Problem: Elimination: Goal: Will not experience complications related to bowel motility Outcome: Progressing Goal: Will not experience complications related to urinary retention Outcome: Progressing

## 2022-05-14 NOTE — Plan of Care (Signed)
  Problem: Education: Goal: Knowledge of General Education information will improve Description: Including pain rating scale, medication(s)/side effects and non-pharmacologic comfort measures Outcome: Progressing   Problem: Skin Integrity: Goal: Risk for impaired skin integrity will decrease Outcome: Progressing   Problem: Pain Managment: Goal: General experience of comfort will improve Outcome: Progressing   Problem: Elimination: Goal: Will not experience complications related to bowel motility Outcome: Progressing   Problem: Nutrition: Goal: Adequate nutrition will be maintained Outcome: Progressing

## 2022-05-14 NOTE — Progress Notes (Signed)
PHARMACIST - PHYSICIAN COMMUNICATION  CONCERNING: Antibiotic IV to Oral Route Change Policy  RECOMMENDATION: This patient is receiving azithromycin by the intravenous route.  Based on criteria approved by the Pharmacy and Therapeutics Committee, the antibiotic(s) is/are being converted to the equivalent oral dose form(s).   DESCRIPTION: These criteria include: Patient being treated for a respiratory tract infection, urinary tract infection, cellulitis or clostridium difficile associated diarrhea if on metronidazole The patient is not neutropenic and does not exhibit a GI malabsorption state The patient is eating (either orally or via tube) and/or has been taking other orally administered medications for a least 24 hours The patient is improving clinically and has a Tmax < 100.5  If you have questions about this conversion, please contact the [facility] 05/14/22

## 2022-05-14 NOTE — Progress Notes (Addendum)
Progress Note    Sabrina Wilson  KGU:[id number] DOB: [birth year]  DOA: 05/12/2022 PCP: Leone Haven, MD      Brief Narrative:    Medical records reviewed and are as summarized below:  Sabrina Wilson is a 82 y.o. female  with medical history significant of stage II breast cancer on Taxotere/Cytoxin, colon cancer s/p colectomy, chronic diastolic CHF, hypertension, who presented to the hospital with shortness of breath for about a week since her last chemotherapy infusion on 04/11/2022.  She also complained of productive cough and fever at home.  She also reported nonbloody diarrhea-started about a week ago.  She started taking Levaquin on 05/09/2022 and took it for 3 days.  She presented to the hospital because of worsening shortness of breath and cough.  Diarrhea had resolved prior to admission.  She has a 30-pack-year smoking history.    She was febrile in the emergency room with temperature of 100.6 F, tachypneic, tachycardic, hypoxic with oxygen saturation of 88% on room air.  Lactic acid was 2.4 and went up to 3.9.  She also had leukocytosis with WBC of 29.7.        Assessment/Plan:   Principal Problem:   Acute hypoxic respiratory failure (HCC) Active Problems:   Demand ischemia   Carcinoma of overlapping sites of right breast in female, estrogen receptor positive ([place])   Hypertension   Bilateral pneumonia   Severe sepsis ([place])   Body mass index is 28.57 kg/m.    Severe sepsis secondary to bilateral pneumonia, worsening leukocytosis, immunocompromised state: Continue IV ceftriaxone and oral azithromycin.  Follow-up blood cultures.  Sputum culture is growing few Staphylococcus aureus but susceptibility report is pending.  Check MRSA PCR.  Start empiric vancomycin for now given worsening leukocytosis and immunocompromised state.  Check urine antigen for Legionella and strep pneumo.   Acute hypoxic respiratory failure: She is off of BiPAP.  She is tolerating  3 L/min oxygen via nasal cannula.  Taper off oxygen as able. Suspected COPD exacerbation.  No prior diagnosis of COPD.  Continue bronchodilators.  Discontinue prednisone   Elevated troponins: This is likely demand ischemia.  No chest pain.   Hypokalemia: Improved   Carcinoma of overlapping sites of right breast in female, estrogen receptor positive [facility]): Recently completed 4 courses of Taxotere-cytotoxin with plans to initiate radiation therapy shortly    Mildly elevated BNP: 2D echo in May 2022 showed EF estimated at 60 to 62%, grade 1 diastolic dysfunction    Diet Order             Diet Heart Room service appropriate? Yes; Fluid consistency: Thin  Diet effective now                            Consultants: None  Procedures: None    Medications:    atorvastatin  10 mg Oral Daily   azithromycin  500 mg Oral Daily   benzonatate  200 mg Oral TID   enoxaparin (LOVENOX) injection  40 mg Subcutaneous Q24H   guaiFENesin  600 mg Oral BID   ipratropium-albuterol  3 mL Nebulization TID   niacin  500 mg Oral QHS   Continuous Infusions:  cefTRIAXone (ROCEPHIN)  IV 2 g (05/13/22 2120)     Anti-infectives (From admission, onward)    Start     Dose/Rate Route Frequency Ordered Stop   05/14/22 1500  azithromycin (ZITHROMAX) tablet 500 mg  500 mg Oral Daily 05/14/22 1407     05/12/22 2200  cefTRIAXone (ROCEPHIN) 2 g in sodium chloride 0.9 % 100 mL IVPB        2 g 200 mL/hr over 30 Minutes Intravenous Every 24 hours 05/12/22 1654     05/12/22 1600  azithromycin (ZITHROMAX) 500 mg in sodium chloride 0.9 % 250 mL IVPB  Status:  Discontinued        500 mg 250 mL/hr over 60 Minutes Intravenous Every 24 hours 05/12/22 1530 05/14/22 1407   05/12/22 1330  vancomycin (VANCOREADY) IVPB 1750 mg/350 mL  Status:  Discontinued        1,750 mg 175 mL/hr over 120 Minutes Intravenous  Once 05/12/22 1327 05/12/22 1530   05/12/22 1300  ceFEPIme (MAXIPIME) 2 g in sodium  chloride 0.9 % 100 mL IVPB        2 g 200 mL/hr over 30 Minutes Intravenous  Once 05/12/22 1248 05/12/22 1517   05/12/22 1300  metroNIDAZOLE (FLAGYL) IVPB 500 mg  Status:  Discontinued        500 mg 100 mL/hr over 60 Minutes Intravenous  Once 05/12/22 1248 05/12/22 1530              Family Communication/Anticipated D/C date and plan/Code Status   DVT prophylaxis: enoxaparin (LOVENOX) injection 40 mg Start: 05/12/22 2200     Code Status: Full Code  Family Communication: Plan discussed with her husband and Sonia Side (son) at bedside Disposition Plan: Plan to discharge home in 2 to 3 days   Status is: Inpatient Remains inpatient appropriate because: IV antibiotics       Subjective:   Interval events noted.  She complains of cough.  Breathing is better.  She thought she heard herself wheezing this morning.  Her husband and son were at the bedside  Objective:    Vitals:   05/14/22 0008 05/14/22 0756 05/14/22 0758 05/14/22 1339  BP: 124/65 126/78    Pulse: 92 80    Resp: 18 18    Temp: 97.7 F (36.5 C) 98.4 F (36.9 C)    TempSrc:      SpO2: 97% 97% 96% 96%  Weight:      Height:       No data found.  No intake or output data in the 24 hours ending 05/14/22 1535  Filed Weights   05/12/22 1729  Weight: 80.3 kg    Exam:    GEN: NAD SKIN: Warm and dry EYES: EOMI ENT: MMM CV: RRR PULM: No wheezing or rales heard ABD: soft, ND, NT, +BS CNS: AAO x 3, non focal EXT: No edema or tenderness   Data Reviewed:   I have personally reviewed following labs and imaging studies:  Labs: Labs show the following:   Basic Metabolic Panel: Recent Labs  Lab 05/09/22 1006 05/11/22 0944 05/12/22 1334 05/13/22 0420 05/13/22 1333 05/14/22 0409  NA 131* 131* 130* 134*  --  136  K 3.6 3.6 3.4* 3.1*  --  3.8  CL 92* 93* 93* 100  --  104  CO2 '28 28 27 24  '$ --  26  GLUCOSE 120* 122* 134* 263*  --  138*  BUN '20 10 10 10  '$ --  17  CREATININE 0.77 0.82 0.75 0.90   --  0.80  CALCIUM 9.2 9.3 9.6 8.8*  --  9.1  MG  --  1.5* 1.9  --  1.8 2.1   GFR Estimated Creatinine Clearance: 60.9 mL/min (by C-G formula  based on SCr of 0.8 mg/dL). Liver Function Tests: Recent Labs  Lab 05/11/22 0944 05/12/22 1334 05/13/22 0420  AST 24 23 42*  ALT '13 14 15  '$ ALKPHOS 74 97 78  BILITOT 0.7 0.6 0.4  PROT 6.3* 6.8 5.8*  ALBUMIN 3.1* 3.4* 2.9*   No results for input(s): "LIPASE", "AMYLASE" in the last 168 hours. No results for input(s): "AMMONIA" in the last 168 hours. Coagulation profile No results for input(s): "INR", "PROTIME" in the last 168 hours.  CBC: Recent Labs  Lab 05/09/22 1006 05/11/22 0944 05/12/22 1334 05/13/22 0420 05/14/22 0409  WBC 3.3* 25.0* 29.7* 30.7* 34.8*  NEUTROABS 1.1* 18.1* 22.0* 26.3* 29.6*  HGB 10.0* 10.2* 10.6* 9.0* 8.7*  HCT 29.7* 30.5* 32.8* 28.4* 26.8*  MCV 99.7 101.0* 100.9* 103.6* 100.0  PLT 237 257 283 240 241   Cardiac Enzymes: No results for input(s): "CKTOTAL", "CKMB", "CKMBINDEX", "TROPONINI" in the last 168 hours. BNP (last 3 results) No results for input(s): "PROBNP" in the last 8760 hours. CBG: No results for input(s): "GLUCAP" in the last 168 hours. D-Dimer: No results for input(s): "DDIMER" in the last 72 hours. Hgb A1c: No results for input(s): "HGBA1C" in the last 72 hours. Lipid Profile: No results for input(s): "CHOL", "HDL", "LDLCALC", "TRIG", "CHOLHDL", "LDLDIRECT" in the last 72 hours. Thyroid function studies: No results for input(s): "TSH", "T4TOTAL", "T3FREE", "THYROIDAB" in the last 72 hours.  Invalid input(s): "FREET3" Anemia work up: No results for input(s): "VITAMINB12", "FOLATE", "FERRITIN", "TIBC", "IRON", "RETICCTPCT" in the last 72 hours. Sepsis Labs: Recent Labs  Lab 05/11/22 0944 05/12/22 1334 05/13/22 0420 05/13/22 1340 05/14/22 0409  WBC 25.0* 29.7* 30.7*  --  34.8*  LATICACIDVEN  --  2.4*  2.1* 3.9* 1.9  --     Microbiology Recent Results (from the past 240 hour(s))   Resp panel by RT-PCR (RSV, Flu A&B, Covid) Anterior Nasal Swab     Status: None   Collection Time: 05/12/22 12:49 PM   Specimen: Anterior Nasal Swab  Result Value Ref Range Status   SARS Coronavirus 2 by RT PCR NEGATIVE NEGATIVE Final    Comment: (NOTE) SARS-CoV-2 target nucleic acids are NOT DETECTED.  The SARS-CoV-2 RNA is generally detectable in upper respiratory specimens during the acute phase of infection. The lowest concentration of SARS-CoV-2 viral copies this assay can detect is 138 copies/mL. A negative result does not preclude SARS-Cov-2 infection and should not be used as the sole basis for treatment or other patient management decisions. A negative result may occur with  improper specimen collection/handling, submission of specimen other than nasopharyngeal swab, presence of viral mutation(s) within the areas targeted by this assay, and inadequate number of viral copies(<138 copies/mL). A negative result must be combined with clinical observations, patient history, and epidemiological information. The expected result is Negative.  Fact Sheet for Patients:  EntrepreneurPulse.com.au  Fact Sheet for Healthcare Providers:  IncredibleEmployment.be  This test is no t yet approved or cleared by the [place] FDA and  has been authorized for detection and/or diagnosis of SARS-CoV-2 by FDA under an Emergency Use Authorization (EUA). This EUA will remain  in effect (meaning this test can be used) for the duration of the COVID-19 declaration under Section 564(b)(1) of the Act, 21 U.S.C.section 360bbb-3(b)(1), unless the authorization is terminated  or revoked sooner.       Influenza A by PCR NEGATIVE NEGATIVE Final   Influenza B by PCR NEGATIVE NEGATIVE Final    Comment: (NOTE) The Xpert Xpress SARS-CoV-2/FLU/RSV  plus assay is intended as an aid in the diagnosis of influenza from Nasopharyngeal swab specimens and should not be used  as a sole basis for treatment. Nasal washings and aspirates are unacceptable for Xpert Xpress SARS-CoV-2/FLU/RSV testing.  Fact Sheet for Patients: EntrepreneurPulse.com.au  Fact Sheet for Healthcare Providers: IncredibleEmployment.be  This test is not yet approved or cleared by the [place] FDA and has been authorized for detection and/or diagnosis of SARS-CoV-2 by FDA under an Emergency Use Authorization (EUA). This EUA will remain in effect (meaning this test can be used) for the duration of the COVID-19 declaration under Section 564(b)(1) of the Act, 21 U.S.C. section 360bbb-3(b)(1), unless the authorization is terminated or revoked.     Resp Syncytial Virus by PCR NEGATIVE NEGATIVE Final    Comment: (NOTE) Fact Sheet for Patients: EntrepreneurPulse.com.au  Fact Sheet for Healthcare Providers: IncredibleEmployment.be  This test is not yet approved or cleared by the [place] FDA and has been authorized for detection and/or diagnosis of SARS-CoV-2 by FDA under an Emergency Use Authorization (EUA). This EUA will remain in effect (meaning this test can be used) for the duration of the COVID-19 declaration under Section 564(b)(1) of the Act, 21 U.S.C. section 360bbb-3(b)(1), unless the authorization is terminated or revoked.  Performed at [facility], [address], [place], [place] [address]   Blood Culture (routine x 2)     Status: None (Preliminary result)   Collection Time: 05/12/22 12:49 PM   Specimen: BLOOD  Result Value Ref Range Status   Specimen Description BLOOD LEFT ANTECUBITAL  Final   Special Requests BOTTLES DRAWN AEROBIC AND ANAEROBIC BCLV  Final   Culture   Final    NO GROWTH 2 DAYS Performed at [facility], [address], [place], [place] [address]    Report Status PENDING  Incomplete  Blood Culture (routine x 2)     Status: None (Preliminary result)    Collection Time: 05/12/22 12:49 PM   Specimen: BLOOD  Result Value Ref Range Status   Specimen Description BLOOD LEFT HAND  Final   Special Requests BOTTLES DRAWN AEROBIC AND ANAEROBIC BCAV  Final   Culture   Final    NO GROWTH 2 DAYS Performed at [facility], [address], [place], [place] [address]    Report Status PENDING  Incomplete  Urine Culture     Status: None   Collection Time: 05/12/22  1:34 PM   Specimen: In/Out Cath Urine  Result Value Ref Range Status   Specimen Description   Final    IN/OUT CATH URINE Performed at [facility], [address] [place], [place] [address]    Special Requests   Final    NONE Performed at [facility], [address], [place], [place] [address]    Culture   Final    NO GROWTH Performed at [facility], [place] [address] [place], [place] [address]    Report Status 05/13/2022 FINAL  Final  Expectorated Sputum Assessment w Gram Stain, Rflx to Resp Cult     Status: None   Collection Time: 05/12/22  5:33 PM   Specimen: Expectorated Sputum  Result Value Ref Range Status   Specimen Description EXPECTORATED SPUTUM  Final   Special Requests NONE  Final   Sputum evaluation   Final    THIS SPECIMEN IS ACCEPTABLE FOR SPUTUM CULTURE Performed at [facility], [address] [place],  [address]    Report Status 05/12/2022  FINAL  Final  Respiratory (~20 pathogens) panel by PCR     Status: None   Collection Time: 05/12/22  5:33 PM   Specimen: Nasopharyngeal Swab; Respiratory  Result Value Ref Range Status   Adenovirus NOT DETECTED NOT DETECTED Final   Coronavirus 229E NOT DETECTED NOT DETECTED Final    Comment: (NOTE) The Coronavirus on the Respiratory Panel, DOES NOT test for the novel  Coronavirus (2019 nCoV)    Coronavirus HKU1 NOT DETECTED NOT DETECTED Final   Coronavirus NL63 NOT DETECTED NOT DETECTED Final   Coronavirus OC43 NOT DETECTED NOT DETECTED Final   Metapneumovirus  NOT DETECTED NOT DETECTED Final   Rhinovirus / Enterovirus NOT DETECTED NOT DETECTED Final   Influenza A NOT DETECTED NOT DETECTED Final   Influenza B NOT DETECTED NOT DETECTED Final   Parainfluenza Virus 1 NOT DETECTED NOT DETECTED Final   Parainfluenza Virus 2 NOT DETECTED NOT DETECTED Final   Parainfluenza Virus 3 NOT DETECTED NOT DETECTED Final   Parainfluenza Virus 4 NOT DETECTED NOT DETECTED Final   Respiratory Syncytial Virus NOT DETECTED NOT DETECTED Final   Bordetella pertussis NOT DETECTED NOT DETECTED Final   Bordetella Parapertussis NOT DETECTED NOT DETECTED Final   Chlamydophila pneumoniae NOT DETECTED NOT DETECTED Final   Mycoplasma pneumoniae NOT DETECTED NOT DETECTED Final    Comment: Performed at [facility], [place], [place], [place] [address]  Culture, Respiratory w Gram Stain     Status: None (Preliminary result)   Collection Time: 05/12/22  5:33 PM  Result Value Ref Range Status   Specimen Description   Final    EXPECTORATED SPUTUM Performed at [facility], [address] [place], [place] [address]    Special Requests   Final    NONE Reflexed from [phone] Performed at [facility], [address] [place], [place] [address]    Gram Stain   Final    MODERATE WBC PRESENT,BOTH PMN AND MONONUCLEAR MODERATE GRAM POSITIVE COCCI IN PAIRS FEW GRAM NEGATIVE RODS RARE SQUAMOUS EPITHELIAL CELLS PRESENT    Culture   Final    FEW STAPHYLOCOCCUS AUREUS CULTURE REINCUBATED FOR BETTER GROWTH Performed at [facility], [place] [address], [place],  [address]    Report Status PENDING  Incomplete    Procedures and diagnostic studies:  No results found.             LOS: 2 days   Redina Zeller  [facility]   Pager on [email or web address]. If 7PM-7AM, please contact night-coverage at [email or web address]     05/14/2022, 3:35 PM

## 2022-05-14 NOTE — Progress Notes (Addendum)
Pharmacy Antibiotic Note  Sabrina Wilson is a 82 y.o. female admitted on 05/12/2022 with pneumonia.  Pharmacy has been consulted for vancomycin dosing.  Renal function at baseline.  Plan: Vancomycin IV 2,000 mg x 1 loading dose  Vancomycin IV 1500 mg every 24 hours Goal AUC 400-600 Estimated AUC 532.7, Cmin 12.6  Also on ceftriaxone and azithromycin.  Follow up renal function and dose adjustments. Follow up  length of therapy and culture sensitivities  Height: '5\' 6"'$  (167.6 cm) Weight: 80.3 kg (177 lb) IBW/kg (Calculated) : 59.3  Temp (24hrs), Avg:98.2 F (36.8 C), Min:97.7 F (36.5 C), Max:98.4 F (36.9 C)  Recent Labs  Lab 05/09/22 1006 05/11/22 0944 05/12/22 1334 05/13/22 0420 05/13/22 1340 05/14/22 0409  WBC 3.3* 25.0* 29.7* 30.7*  --  34.8*  CREATININE 0.77 0.82 0.75 0.90  --  0.80  LATICACIDVEN  --   --  2.4*  2.1* 3.9* 1.9  --     Estimated Creatinine Clearance: 60.9 mL/min (by C-G formula based on SCr of 0.8 mg/dL).    Allergies  Allergen Reactions   Sulfa Antibiotics Hives    As a child    Antimicrobials this admission: Cefepime 1/18 >> 1/18 Azithromycin 1/18 >>  Ceftriaxone 1/18 >> Vancomycin 1/20 >>  Dose adjustments this admission: N/a  Microbiology results: 05/12/22 BCx: NG x 2 days 05/12/22 UCx: no growth  05/12/22 Sputum: Few S. Aureus. Awaiting sensitivities 05/12/22 MRSA PCR: ordered  Thank you for allowing pharmacy to be a part of this patient's care.   Glean Salvo, PharmD, BCPS Clinical Pharmacist  05/14/2022 4:09 PM

## 2022-05-15 DIAGNOSIS — J189 Pneumonia, unspecified organism: Secondary | ICD-10-CM | POA: Diagnosis not present

## 2022-05-15 DIAGNOSIS — J9601 Acute respiratory failure with hypoxia: Secondary | ICD-10-CM | POA: Diagnosis not present

## 2022-05-15 DIAGNOSIS — C50811 Malignant neoplasm of overlapping sites of right female breast: Secondary | ICD-10-CM | POA: Diagnosis not present

## 2022-05-15 DIAGNOSIS — A419 Sepsis, unspecified organism: Secondary | ICD-10-CM | POA: Diagnosis not present

## 2022-05-15 LAB — CBC WITH DIFFERENTIAL/PLATELET
Abs Immature Granulocytes: 1.07 10*3/uL — ABNORMAL HIGH (ref 0.00–0.07)
Basophils Absolute: 0.1 10*3/uL (ref 0.0–0.1)
Basophils Relative: 0 %
Eosinophils Absolute: 0.1 10*3/uL (ref 0.0–0.5)
Eosinophils Relative: 0 %
HCT: 27.2 % — ABNORMAL LOW (ref 36.0–46.0)
Hemoglobin: 8.8 g/dL — ABNORMAL LOW (ref 12.0–15.0)
Immature Granulocytes: 5 %
Lymphocytes Relative: 4 %
Lymphs Abs: 0.9 10*3/uL (ref 0.7–4.0)
MCH: 32.1 pg (ref 26.0–34.0)
MCHC: 32.4 g/dL (ref 30.0–36.0)
MCV: 99.3 fL (ref 80.0–100.0)
Monocytes Absolute: 1.3 10*3/uL — ABNORMAL HIGH (ref 0.1–1.0)
Monocytes Relative: 6 %
Neutro Abs: 17.2 10*3/uL — ABNORMAL HIGH (ref 1.7–7.7)
Neutrophils Relative %: 85 %
Platelets: 253 10*3/uL (ref 150–400)
RBC: 2.74 MIL/uL — ABNORMAL LOW (ref 3.87–5.11)
RDW: 15 % (ref 11.5–15.5)
WBC: 20.5 10*3/uL — ABNORMAL HIGH (ref 4.0–10.5)
nRBC: 0.1 % (ref 0.0–0.2)

## 2022-05-15 LAB — CREATININE, SERUM
Creatinine, Ser: 0.7 mg/dL (ref 0.44–1.00)
GFR, Estimated: >60 mL/min (ref >60)

## 2022-05-15 LAB — STREP PNEUMONIAE URINARY ANTIGEN: Strep Pneumo Urinary Antigen: NEGATIVE

## 2022-05-15 MED ORDER — POLYETHYLENE GLYCOL 3350 17 G PO PACK
17.0000 g | PACK | Freq: Every day | ORAL | Status: DC | PRN
  Filled 2022-05-15: qty 1

## 2022-05-15 MED ORDER — BISACODYL 5 MG PO TBEC
5.0000 mg | DELAYED_RELEASE_TABLET | Freq: Every day | ORAL | Status: DC | PRN
  Administered 2022-05-15: 5 mg via ORAL
  Filled 2022-05-15: qty 1

## 2022-05-15 MED ORDER — SENNOSIDES-DOCUSATE SODIUM 8.6-50 MG PO TABS
1.0000 | ORAL_TABLET | Freq: Two times a day (BID) | ORAL | Status: DC | PRN
  Administered 2022-05-15: 1 via ORAL
  Filled 2022-05-15: qty 1

## 2022-05-15 MED ORDER — BISACODYL 10 MG RE SUPP: 10.0000 mg | Freq: Every day | RECTAL | Status: DC | PRN

## 2022-05-15 NOTE — Plan of Care (Signed)
  Problem: Clinical Measurements: Goal: Ability to maintain clinical measurements within normal limits will improve Outcome: Progressing   Problem: Nutrition: Goal: Adequate nutrition will be maintained Outcome: Progressing   Problem: Elimination: Goal: Will not experience complications related to bowel motility Outcome: Progressing Goal: Will not experience complications related to urinary retention Outcome: Progressing   Problem: Pain Managment: Goal: General experience of comfort will improve Outcome: Progressing

## 2022-05-15 NOTE — Progress Notes (Signed)
Progress Note    Sabrina Wilson  BJY:[id number] DOB: 1940/10/30  DOA: 05/12/2022 PCP: Leone Haven, MD      Brief Narrative:    Medical records reviewed and are as summarized below:  Sabrina Wilson is a 82 y.o. female  with medical history significant of stage II breast cancer on Taxotere/Cytoxin, colon cancer s/p colectomy, chronic diastolic CHF, hypertension, who presented to the hospital with shortness of breath for about a week since her last chemotherapy infusion on 04/11/2022.  She also complained of productive cough and fever at home.  She also reported nonbloody diarrhea-started about a week ago.  She started taking Levaquin on 05/09/2022 and took it for 3 days.  She presented to the hospital because of worsening shortness of breath and cough.  Diarrhea had resolved prior to admission.  She has a 30-pack-year smoking history.    She was febrile in the emergency room with temperature of 100.6 F, tachypneic, tachycardic, hypoxic with oxygen saturation of 88% on room air.  Lactic acid was 2.4 and went up to 3.9.  She also had leukocytosis with WBC of 29.7.        Assessment/Plan:   Principal Problem:   Acute hypoxic respiratory failure (HCC) Active Problems:   Demand ischemia   Carcinoma of overlapping sites of right breast in female, estrogen receptor positive ([place])   Hypertension   Bilateral pneumonia   Severe sepsis ([place])   Body mass index is 28.57 kg/m.    Severe sepsis secondary to bilateral pneumonia, worsening leukocytosis, immunocompromised state: MRSA PCR screen was negative.  Strep pneumo urine antigen was negative.  Continue IV ceftriaxone and oral azithromycin.  Discontinue IV vancomycin.   Acute hypoxic respiratory failure: Initially required BiPAP on admission.  She is tolerating 3 L/min oxygen via [place].  Wean off oxygen as able.  Suspected COPD exacerbation.  No prior diagnosis of COPD.  Continue bronchodilators as needed.  Outpatient  follow-up with pulmonologist for lung function test.  Elevated troponins: This is likely demand ischemia.  No chest pain.   Hypokalemia: Improved  Constipation: Start MiraLAX and Senokot as needed.   Carcinoma of overlapping sites of right breast in female, estrogen receptor positive [facility]): Recently completed 4 courses of Taxotere-cytotoxin with plans to initiate radiation therapy shortly    Mildly elevated BNP: 2D echo in May 2022 showed EF estimated at 60 to 08%, grade 1 diastolic dysfunction    Diet Order             Diet Heart Room service appropriate? Yes; Fluid consistency: Thin  Diet effective now                            Consultants: None  Procedures: None    Medications:    atorvastatin  10 mg Oral Daily   azithromycin  500 mg Oral Daily   benzonatate  200 mg Oral TID   enoxaparin (LOVENOX) injection  40 mg Subcutaneous Q24H   guaiFENesin  600 mg Oral BID   ipratropium-albuterol  3 mL Nebulization TID   niacin  500 mg Oral QHS   Continuous Infusions:  cefTRIAXone (ROCEPHIN)  IV Stopped (05/14/22 2220)   vancomycin       Anti-infectives (From admission, onward)    Start     Dose/Rate Route Frequency Ordered Stop   05/15/22 2000  vancomycin (VANCOREADY) IVPB 1500 mg/300 mL        1,500  mg 150 mL/hr over 120 Minutes Intravenous Every 24 hours 05/14/22 1728     05/14/22 1730  vancomycin (VANCOREADY) IVPB 2000 mg/400 mL        2,000 mg 200 mL/hr over 120 Minutes Intravenous  Once 05/14/22 1639 05/14/22 1945   05/14/22 1500  azithromycin (ZITHROMAX) tablet 500 mg        500 mg Oral Daily 05/14/22 1407     05/12/22 2200  cefTRIAXone (ROCEPHIN) 2 g in sodium chloride 0.9 % 100 mL IVPB        2 g 200 mL/hr over 30 Minutes Intravenous Every 24 hours 05/12/22 1654     05/12/22 1600  azithromycin (ZITHROMAX) 500 mg in sodium chloride 0.9 % 250 mL IVPB  Status:  Discontinued        500 mg 250 mL/hr over 60 Minutes Intravenous Every 24 hours  05/12/22 1530 05/14/22 1407   05/12/22 1330  vancomycin (VANCOREADY) IVPB 1750 mg/350 mL  Status:  Discontinued        1,750 mg 175 mL/hr over 120 Minutes Intravenous  Once 05/12/22 1327 05/12/22 1530   05/12/22 1300  ceFEPIme (MAXIPIME) 2 g in sodium chloride 0.9 % 100 mL IVPB        2 g 200 mL/hr over 30 Minutes Intravenous  Once 05/12/22 1248 05/12/22 1517   05/12/22 1300  metroNIDAZOLE (FLAGYL) IVPB 500 mg  Status:  Discontinued        500 mg 100 mL/hr over 60 Minutes Intravenous  Once 05/12/22 1248 05/12/22 1530              Family Communication/Anticipated D/C date and plan/Code Status   DVT prophylaxis: enoxaparin (LOVENOX) injection 40 mg Start: 05/12/22 2200     Code Status: Full Code  Family Communication: Plan discussed with her husband and friend at the bedside Disposition Plan: Plan to discharge home in 2 to 3 days   Status is: Inpatient Remains inpatient appropriate because: IV antibiotics       Subjective:   Interval events noted.  She complains of constipation.  She said her oxygen came off last night and oxygen saturation dropped to 91% in total room air.  She said she felt short of breath but her oxygen was off.  She felt better after she was placed on oxygen.  Her husband was at the bedside.  Her friend was at the bedside.  Patient said it was okay for her friend to stay during this encounter.  Her nurse was also at the bedside.  Objective:    Vitals:   05/15/22 0137 05/15/22 0243 05/15/22 0756 05/15/22 0809  BP:  136/74 134/67   Pulse: 97 95 87   Resp:  18 16   Temp:  98.7 F (37.1 C) 99.9 F (37.7 C)   TempSrc:      SpO2: 95% 99% 97% 96%  Weight:      Height:       No data found.   Intake/Output Summary (Last 24 hours) at 05/15/2022 1108 Last data filed at 05/15/2022 0321 Gross per 24 hour  Intake 456.95 ml  Output --  Net 456.95 ml    Filed Weights   05/12/22 1729  Weight: 80.3 kg    Exam:    GEN: NAD SKIN: Warm and  dry EYES: EOMI ENT: MMM CV: RRR PULM: Bilateral rhonchi ABD: soft, ND, NT, +BS CNS: AAO x 3, non focal EXT: No edema or tenderness    Data Reviewed:   I have  personally reviewed following labs and imaging studies:  Labs: Labs show the following:   Basic Metabolic Panel: Recent Labs  Lab 05/09/22 1006 05/11/22 0944 05/12/22 1334 05/13/22 0420 05/13/22 1333 05/14/22 0409 05/15/22 0412  NA 131* 131* 130* 134*  --  136  --   K 3.6 3.6 3.4* 3.1*  --  3.8  --   CL 92* 93* 93* 100  --  104  --   CO2 '28 28 27 24  '$ --  26  --   GLUCOSE 120* 122* 134* 263*  --  138*  --   BUN '20 10 10 10  '$ --  17  --   CREATININE 0.77 0.82 0.75 0.90  --  0.80 0.70  CALCIUM 9.2 9.3 9.6 8.8*  --  9.1  --   MG  --  1.5* 1.9  --  1.8 2.1  --    GFR Estimated Creatinine Clearance: 60.9 mL/min (by C-G formula based on SCr of 0.7 mg/dL). Liver Function Tests: Recent Labs  Lab 05/11/22 0944 05/12/22 1334 05/13/22 0420  AST 24 23 42*  ALT '13 14 15  '$ ALKPHOS 74 97 78  BILITOT 0.7 0.6 0.4  PROT 6.3* 6.8 5.8*  ALBUMIN 3.1* 3.4* 2.9*   No results for input(s): "LIPASE", "AMYLASE" in the last 168 hours. No results for input(s): "AMMONIA" in the last 168 hours. Coagulation profile No results for input(s): "INR", "PROTIME" in the last 168 hours.  CBC: Recent Labs  Lab 05/11/22 0944 05/12/22 1334 05/13/22 0420 05/14/22 0409 05/15/22 0412  WBC 25.0* 29.7* 30.7* 34.8* 20.5*  NEUTROABS 18.1* 22.0* 26.3* 29.6* 17.2*  HGB 10.2* 10.6* 9.0* 8.7* 8.8*  HCT 30.5* 32.8* 28.4* 26.8* 27.2*  MCV 101.0* 100.9* 103.6* 100.0 99.3  PLT 257 283 240 241 253   Cardiac Enzymes: No results for input(s): "CKTOTAL", "CKMB", "CKMBINDEX", "TROPONINI" in the last 168 hours. BNP (last 3 results) No results for input(s): "PROBNP" in the last 8760 hours. CBG: No results for input(s): "GLUCAP" in the last 168 hours. D-Dimer: No results for input(s): "DDIMER" in the last 72 hours. Hgb A1c: No results for  input(s): "HGBA1C" in the last 72 hours. Lipid Profile: No results for input(s): "CHOL", "HDL", "LDLCALC", "TRIG", "CHOLHDL", "LDLDIRECT" in the last 72 hours. Thyroid function studies: No results for input(s): "TSH", "T4TOTAL", "T3FREE", "THYROIDAB" in the last 72 hours.  Invalid input(s): "FREET3" Anemia work up: No results for input(s): "VITAMINB12", "FOLATE", "FERRITIN", "TIBC", "IRON", "RETICCTPCT" in the last 72 hours. Sepsis Labs: Recent Labs  Lab 05/12/22 1334 05/13/22 0420 05/13/22 1340 05/14/22 0409 05/15/22 0412  WBC 29.7* 30.7*  --  34.8* 20.5*  LATICACIDVEN 2.4*  2.1* 3.9* 1.9  --   --     Microbiology Recent Results (from the past 240 hour(s))  Resp panel by RT-PCR (RSV, Flu A&B, Covid) Anterior Nasal Swab     Status: None   Collection Time: 05/12/22 12:49 PM   Specimen: Anterior Nasal Swab  Result Value Ref Range Status   SARS Coronavirus 2 by RT PCR NEGATIVE NEGATIVE Final    Comment: (NOTE) SARS-CoV-2 target nucleic acids are NOT DETECTED.  The SARS-CoV-2 RNA is generally detectable in upper respiratory specimens during the acute phase of infection. The lowest concentration of SARS-CoV-2 viral copies this assay can detect is 138 copies/mL. A negative result does not preclude SARS-Cov-2 infection and should not be used as the sole basis for treatment or other patient management decisions. A negative result may occur with  improper  specimen collection/handling, submission of specimen other than nasopharyngeal swab, presence of viral mutation(s) within the areas targeted by this assay, and inadequate number of viral copies(<138 copies/mL). A negative result must be combined with clinical observations, patient history, and epidemiological information. The expected result is Negative.  Fact Sheet for Patients:  EntrepreneurPulse.com.au  Fact Sheet for Healthcare Providers:  IncredibleEmployment.be  This test is no t yet  approved or cleared by the [place] FDA and  has been authorized for detection and/or diagnosis of SARS-CoV-2 by FDA under an Emergency Use Authorization (EUA). This EUA will remain  in effect (meaning this test can be used) for the duration of the COVID-19 declaration under Section 564(b)(1) of the Act, 21 U.S.C.section 360bbb-3(b)(1), unless the authorization is terminated  or revoked sooner.       Influenza A by PCR NEGATIVE NEGATIVE Final   Influenza B by PCR NEGATIVE NEGATIVE Final    Comment: (NOTE) The Xpert Xpress SARS-CoV-2/FLU/RSV plus assay is intended as an aid in the diagnosis of influenza from Nasopharyngeal swab specimens and should not be used as a sole basis for treatment. Nasal washings and aspirates are unacceptable for Xpert Xpress SARS-CoV-2/FLU/RSV testing.  Fact Sheet for Patients: EntrepreneurPulse.com.au  Fact Sheet for Healthcare Providers: IncredibleEmployment.be  This test is not yet approved or cleared by the [place] FDA and has been authorized for detection and/or diagnosis of SARS-CoV-2 by FDA under an Emergency Use Authorization (EUA). This EUA will remain in effect (meaning this test can be used) for the duration of the COVID-19 declaration under Section 564(b)(1) of the Act, 21 U.S.C. section 360bbb-3(b)(1), unless the authorization is terminated or revoked.     Resp Syncytial Virus by PCR NEGATIVE NEGATIVE Final    Comment: (NOTE) Fact Sheet for Patients: EntrepreneurPulse.com.au  Fact Sheet for Healthcare Providers: IncredibleEmployment.be  This test is not yet approved or cleared by the [place] FDA and has been authorized for detection and/or diagnosis of SARS-CoV-2 by FDA under an Emergency Use Authorization (EUA). This EUA will remain in effect (meaning this test can be used) for the duration of the COVID-19 declaration under Section 564(b)(1) of  the Act, 21 U.S.C. section 360bbb-3(b)(1), unless the authorization is terminated or revoked.  Performed at [facility], [address] [place], [place] [address]   Blood Culture (routine x 2)     Status: None (Preliminary result)   Collection Time: 05/12/22 12:49 PM   Specimen: BLOOD  Result Value Ref Range Status   Specimen Description BLOOD LEFT ANTECUBITAL  Final   Special Requests BOTTLES DRAWN AEROBIC AND ANAEROBIC BCLV  Final   Culture   Final    NO GROWTH 3 DAYS Performed at [facility], [address] [place], [place] [address]    Report Status PENDING  Incomplete  Blood Culture (routine x 2)     Status: None (Preliminary result)   Collection Time: 05/12/22 12:49 PM   Specimen: BLOOD  Result Value Ref Range Status   Specimen Description BLOOD LEFT HAND  Final   Special Requests BOTTLES DRAWN AEROBIC AND ANAEROBIC BCAV  Final   Culture   Final    NO GROWTH 3 DAYS Performed at [facility], [address], [place], [place] [address]    Report Status PENDING  Incomplete  Urine Culture     Status: None   Collection Time: 05/12/22  1:34 PM   Specimen: In/Out Cath Urine  Result Value Ref Range Status   Specimen Description  Final    IN/OUT CATH URINE Performed at [facility], [address] [place], [place] [address]    Special Requests   Final    NONE Performed at [facility], [address] [place], [place] [address]    Culture   Final    NO GROWTH Performed at [facility], [place] [address], [place], [place] [address]    Report Status 05/13/2022 FINAL  Final  Expectorated Sputum Assessment w Gram Stain, Rflx to Resp Cult     Status: None   Collection Time: 05/12/22  5:33 PM   Specimen: Expectorated Sputum  Result Value Ref Range Status   Specimen Description EXPECTORATED SPUTUM  Final   Special Requests NONE  Final   Sputum evaluation   Final    THIS SPECIMEN IS ACCEPTABLE FOR SPUTUM  CULTURE Performed at [facility], [address] [place], [place] [address]    Report Status 05/12/2022 FINAL  Final  Respiratory (~20 pathogens) panel by PCR     Status: None   Collection Time: 05/12/22  5:33 PM   Specimen: Nasopharyngeal Swab; Respiratory  Result Value Ref Range Status   Adenovirus NOT DETECTED NOT DETECTED Final   Coronavirus 229E NOT DETECTED NOT DETECTED Final    Comment: (NOTE) The Coronavirus on the Respiratory Panel, DOES NOT test for the novel  Coronavirus (2019 nCoV)    Coronavirus HKU1 NOT DETECTED NOT DETECTED Final   Coronavirus NL63 NOT DETECTED NOT DETECTED Final   Coronavirus OC43 NOT DETECTED NOT DETECTED Final   Metapneumovirus NOT DETECTED NOT DETECTED Final   Rhinovirus / Enterovirus NOT DETECTED NOT DETECTED Final   Influenza A NOT DETECTED NOT DETECTED Final   Influenza B NOT DETECTED NOT DETECTED Final   Parainfluenza Virus 1 NOT DETECTED NOT DETECTED Final   Parainfluenza Virus 2 NOT DETECTED NOT DETECTED Final   Parainfluenza Virus 3 NOT DETECTED NOT DETECTED Final   Parainfluenza Virus 4 NOT DETECTED NOT DETECTED Final   Respiratory Syncytial Virus NOT DETECTED NOT DETECTED Final   Bordetella pertussis NOT DETECTED NOT DETECTED Final   Bordetella Parapertussis NOT DETECTED NOT DETECTED Final   Chlamydophila pneumoniae NOT DETECTED NOT DETECTED Final   Mycoplasma pneumoniae NOT DETECTED NOT DETECTED Final    Comment: Performed at [facility], [place] [address] [place], [place] [address]  Culture, Respiratory w Gram Stain     Status: None (Preliminary result)   Collection Time: 05/12/22  5:33 PM  Result Value Ref Range Status   Specimen Description   Final    EXPECTORATED SPUTUM Performed at [facility], [address] [place], [place] [address]    Special Requests   Final    NONE Reflexed from [phone] Performed at [facility], [place], [place] [address]    Gram Stain   Final     MODERATE WBC PRESENT,BOTH PMN AND MONONUCLEAR MODERATE GRAM POSITIVE COCCI IN PAIRS FEW GRAM NEGATIVE RODS RARE SQUAMOUS EPITHELIAL CELLS PRESENT    Culture   Final    FEW STAPHYLOCOCCUS AUREUS SUSCEPTIBILITIES TO FOLLOW Performed at [facility], [place] [address], [place], [place] [address]    Report Status PENDING  Incomplete  MRSA Next Gen by PCR, Nasal     Status: None   Collection Time: 05/14/22  5:36 PM   Specimen: Nasal Mucosa; Nasal Swab  Result Value Ref Range Status   MRSA by PCR Next Gen NOT DETECTED NOT DETECTED Final  Comment: (NOTE) The GeneXpert MRSA Assay (FDA approved for NASAL specimens only), is one component of a comprehensive MRSA colonization surveillance program. It is not intended to diagnose MRSA infection nor to guide or monitor treatment for MRSA infections. Test performance is not FDA approved in patients less than 68 years old. Performed at [facility], [address] [place], [place] [address]     Procedures and diagnostic studies:  No results found.             LOS: 3 days   Lylia Karn  [facility]   Pager on [email or web address]. If 7PM-7AM, please contact night-coverage at [email or web address]     05/15/2022, 11:08 AM

## 2022-05-15 NOTE — Plan of Care (Signed)

## 2022-05-16 ENCOUNTER — Telehealth: Payer: Self-pay | Admitting: *Deleted

## 2022-05-16 DIAGNOSIS — A419 Sepsis, unspecified organism: Secondary | ICD-10-CM | POA: Diagnosis not present

## 2022-05-16 DIAGNOSIS — R6520 Severe sepsis without septic shock: Secondary | ICD-10-CM | POA: Diagnosis not present

## 2022-05-16 DIAGNOSIS — J189 Pneumonia, unspecified organism: Secondary | ICD-10-CM | POA: Diagnosis not present

## 2022-05-16 DIAGNOSIS — J9601 Acute respiratory failure with hypoxia: Secondary | ICD-10-CM | POA: Diagnosis not present

## 2022-05-16 LAB — CBC WITH DIFFERENTIAL/PLATELET
Abs Immature Granulocytes: 0.48 10*3/uL — ABNORMAL HIGH (ref 0.00–0.07)
Basophils Absolute: 0.1 10*3/uL (ref 0.0–0.1)
Basophils Relative: 1 %
Eosinophils Absolute: 0.1 10*3/uL (ref 0.0–0.5)
Eosinophils Relative: 1 %
HCT: 28.3 % — ABNORMAL LOW (ref 36.0–46.0)
Hemoglobin: 9.1 g/dL — ABNORMAL LOW (ref 12.0–15.0)
Immature Granulocytes: 3 %
Lymphocytes Relative: 5 %
Lymphs Abs: 0.8 10*3/uL (ref 0.7–4.0)
MCH: 32.3 pg (ref 26.0–34.0)
MCHC: 32.2 g/dL (ref 30.0–36.0)
MCV: 100.4 fL — ABNORMAL HIGH (ref 80.0–100.0)
Monocytes Absolute: 1.1 10*3/uL — ABNORMAL HIGH (ref 0.1–1.0)
Monocytes Relative: 7 %
Neutro Abs: 12.5 10*3/uL — ABNORMAL HIGH (ref 1.7–7.7)
Neutrophils Relative %: 83 %
Platelets: 232 10*3/uL (ref 150–400)
RBC: 2.82 MIL/uL — ABNORMAL LOW (ref 3.87–5.11)
RDW: 15 % (ref 11.5–15.5)
WBC: 15 10*3/uL — ABNORMAL HIGH (ref 4.0–10.5)
nRBC: 0.2 % (ref 0.0–0.2)

## 2022-05-16 LAB — CULTURE, RESPIRATORY W GRAM STAIN

## 2022-05-16 MED ORDER — IPRATROPIUM-ALBUTEROL 0.5-2.5 (3) MG/3ML IN SOLN
3.0000 mL | RESPIRATORY_TRACT | Status: DC | PRN
  Administered 2022-05-17: 3 mL via RESPIRATORY_TRACT
  Filled 2022-05-16: qty 3

## 2022-05-16 NOTE — Progress Notes (Signed)
Ambulating this patient without O2, her saturation dropped from 94% to 89% on room air.

## 2022-05-16 NOTE — Progress Notes (Signed)
  Transition of Care [facility]) Screening Note   Patient Details  Name: Sabrina Wilson Date of Birth: [birth year]   Transition of Care [facility]) CM/SW Contact:    Quin Hoop, LCSW Phone Number: 05/16/2022, 10:28 AM    Transition of Care Department [facility]) has reviewed patient and no TOC needs have been identified at this time. We will continue to monitor patient advancement through interdisciplinary progression rounds. If new patient transition needs arise, please place a TOC consult.

## 2022-05-16 NOTE — Progress Notes (Signed)
Progress Note    Sabrina Wilson  WCB:[id number] DOB: 07-10-1940  DOA: 05/12/2022 PCP: Leone Haven, MD      Brief Narrative:    Medical records reviewed and are as summarized below:  Sabrina Wilson is a 82 y.o. female  with medical history significant of stage II breast cancer on Taxotere/Cytoxin, colon cancer s/p colectomy, chronic diastolic CHF, hypertension, who presented to the hospital with shortness of breath for about a week since her last chemotherapy infusion on 04/11/2022.  She also complained of productive cough and fever at home.  She also reported nonbloody diarrhea-started about a week ago.  She started taking Levaquin on 05/09/2022 and took it for 3 days.  She presented to the hospital because of worsening shortness of breath and cough.  Diarrhea had resolved prior to admission.  She has a 30-pack-year smoking history.    She was febrile in the emergency room with temperature of 100.6 F, tachypneic, tachycardic, hypoxic with oxygen saturation of 88% on room air.  Lactic acid was 2.4 and went up to 3.9.  She also had leukocytosis with WBC of 29.7.        Assessment/Plan:   Principal Problem:   Acute hypoxic respiratory failure (HCC) Active Problems:   Demand ischemia   Carcinoma of overlapping sites of right breast in female, estrogen receptor positive ([place])   Hypertension   Bilateral pneumonia   Severe sepsis ([place])   Body mass index is 28.57 kg/m.    Severe sepsis secondary to bilateral pneumonia, leukocytosis, immunocompromised state: Leukocytosis is improving. Continue oral azithromycin and IV ceftriaxone MRSA PCR screen was negative.  Strep pneumo urine antigen was negative  Acute hypoxic respiratory failure: Initially required BiPAP on admission.  Improved.  According to Kings, RN, patient ambulated in the hall on room air with oxygen saturation of 90 to 95%.  Encourage patient to ambulate with repeat oxygen saturation. She is currently on  1 L/min oxygen via [place] but I think this can be tapered off.  Suspected COPD exacerbation.  No prior diagnosis of COPD.  Continue bronchodilators as needed.  Outpatient follow-up with pulmonologist for lung function test.  Elevated troponins: This is likely demand ischemia.  No chest pain.   Hypokalemia: Improved  Constipation: Start MiraLAX and Senokot as needed.   Carcinoma of overlapping sites of right breast in female, estrogen receptor positive [facility]): Recently completed 4 courses of Taxotere-cytotoxin with plans to initiate radiation therapy shortly    Mildly elevated BNP: 2D echo in May 2022 showed EF estimated at 60 to 61%, grade 1 diastolic dysfunction    Diet Order             Diet Heart Room service appropriate? Yes; Fluid consistency: Thin  Diet effective now                            Consultants: None  Procedures: None    Medications:    atorvastatin  10 mg Oral Daily   azithromycin  500 mg Oral Daily   benzonatate  200 mg Oral TID   enoxaparin (LOVENOX) injection  40 mg Subcutaneous Q24H   guaiFENesin  600 mg Oral BID   niacin  500 mg Oral QHS   Continuous Infusions:  cefTRIAXone (ROCEPHIN)  IV 2 g (05/15/22 2225)     Anti-infectives (From admission, onward)    Start     Dose/Rate Route Frequency Ordered Stop  05/15/22 2000  vancomycin (VANCOREADY) IVPB 1500 mg/300 mL  Status:  Discontinued        1,500 mg 150 mL/hr over 120 Minutes Intravenous Every 24 hours 05/14/22 1728 05/15/22 1155   05/14/22 1730  vancomycin (VANCOREADY) IVPB 2000 mg/400 mL        2,000 mg 200 mL/hr over 120 Minutes Intravenous  Once 05/14/22 1639 05/14/22 1945   05/14/22 1500  azithromycin (ZITHROMAX) tablet 500 mg        500 mg Oral Daily 05/14/22 1407     05/12/22 2200  cefTRIAXone (ROCEPHIN) 2 g in sodium chloride 0.9 % 100 mL IVPB        2 g 200 mL/hr over 30 Minutes Intravenous Every 24 hours 05/12/22 1654     05/12/22 1600  azithromycin (ZITHROMAX)  500 mg in sodium chloride 0.9 % 250 mL IVPB  Status:  Discontinued        500 mg 250 mL/hr over 60 Minutes Intravenous Every 24 hours 05/12/22 1530 05/14/22 1407   05/12/22 1330  vancomycin (VANCOREADY) IVPB 1750 mg/350 mL  Status:  Discontinued        1,750 mg 175 mL/hr over 120 Minutes Intravenous  Once 05/12/22 1327 05/12/22 1530   05/12/22 1300  ceFEPIme (MAXIPIME) 2 g in sodium chloride 0.9 % 100 mL IVPB        2 g 200 mL/hr over 30 Minutes Intravenous  Once 05/12/22 1248 05/12/22 1517   05/12/22 1300  metroNIDAZOLE (FLAGYL) IVPB 500 mg  Status:  Discontinued        500 mg 100 mL/hr over 60 Minutes Intravenous  Once 05/12/22 1248 05/12/22 1530              Family Communication/Anticipated D/C date and plan/Code Status   DVT prophylaxis: enoxaparin (LOVENOX) injection 40 mg Start: 05/12/22 2200     Code Status: Full Code  Family Communication: Plan discussed with her husband at the bedside Disposition Plan: Plan to discharge home in 1 to 2 days   Status is: Inpatient Remains inpatient appropriate because: IV antibiotics       Subjective:   Interval events noted.  She feels better today.  She is able to bring up phlegm when she coughs.  No shortness of breath or chest pain.  Her husband was at the bedside.  Objective:    Vitals:   05/15/22 1700 05/15/22 2059 05/16/22 0026 05/16/22 0731  BP:  134/77 119/63 (!) 149/76  Pulse:  98 89 90  Resp:  '19 20 16  '$ Temp:  99 F (37.2 C) 98 F (36.7 C) 99.4 F (37.4 C)  TempSrc:  Oral    SpO2: 92% 93% 98% 93%  Weight:      Height:       No data found.  No intake or output data in the 24 hours ending 05/16/22 1141   Filed Weights   05/12/22 1729  Weight: 80.3 kg    Exam:  GEN: NAD SKIN: Warm and dry EYES: EOMI ENT: MMM CV: RRR PULM: Bilateral rhonchi ABD: soft, ND, NT, +BS CNS: AAO x 3, non focal EXT: No edema or tenderness     Data Reviewed:   I have personally reviewed following labs and  imaging studies:  Labs: Labs show the following:   Basic Metabolic Panel: Recent Labs  Lab 05/11/22 0944 05/12/22 1334 05/13/22 0420 05/13/22 1333 05/14/22 0409 05/15/22 0412  NA 131* 130* 134*  --  136  --   K 3.6  3.4* 3.1*  --  3.8  --   CL 93* 93* 100  --  104  --   CO2 '28 27 24  '$ --  26  --   GLUCOSE 122* 134* 263*  --  138*  --   BUN '10 10 10  '$ --  17  --   CREATININE 0.82 0.75 0.90  --  0.80 0.70  CALCIUM 9.3 9.6 8.8*  --  9.1  --   MG 1.5* 1.9  --  1.8 2.1  --    GFR Estimated Creatinine Clearance: 60.9 mL/min (by C-G formula based on SCr of 0.7 mg/dL). Liver Function Tests: Recent Labs  Lab 05/11/22 0944 05/12/22 1334 05/13/22 0420  AST 24 23 42*  ALT '13 14 15  '$ ALKPHOS 74 97 78  BILITOT 0.7 0.6 0.4  PROT 6.3* 6.8 5.8*  ALBUMIN 3.1* 3.4* 2.9*   No results for input(s): "LIPASE", "AMYLASE" in the last 168 hours. No results for input(s): "AMMONIA" in the last 168 hours. Coagulation profile No results for input(s): "INR", "PROTIME" in the last 168 hours.  CBC: Recent Labs  Lab 05/12/22 1334 05/13/22 0420 05/14/22 0409 05/15/22 0412 05/16/22 0307  WBC 29.7* 30.7* 34.8* 20.5* 15.0*  NEUTROABS 22.0* 26.3* 29.6* 17.2* 12.5*  HGB 10.6* 9.0* 8.7* 8.8* 9.1*  HCT 32.8* 28.4* 26.8* 27.2* 28.3*  MCV 100.9* 103.6* 100.0 99.3 100.4*  PLT 283 240 241 253 232   Cardiac Enzymes: No results for input(s): "CKTOTAL", "CKMB", "CKMBINDEX", "TROPONINI" in the last 168 hours. BNP (last 3 results) No results for input(s): "PROBNP" in the last 8760 hours. CBG: No results for input(s): "GLUCAP" in the last 168 hours. D-Dimer: No results for input(s): "DDIMER" in the last 72 hours. Hgb A1c: No results for input(s): "HGBA1C" in the last 72 hours. Lipid Profile: No results for input(s): "CHOL", "HDL", "LDLCALC", "TRIG", "CHOLHDL", "LDLDIRECT" in the last 72 hours. Thyroid function studies: No results for input(s): "TSH", "T4TOTAL", "T3FREE", "THYROIDAB" in the last 72  hours.  Invalid input(s): "FREET3" Anemia work up: No results for input(s): "VITAMINB12", "FOLATE", "FERRITIN", "TIBC", "IRON", "RETICCTPCT" in the last 72 hours. Sepsis Labs: Recent Labs  Lab 05/12/22 1334 05/13/22 0420 05/13/22 1340 05/14/22 0409 05/15/22 0412 05/16/22 0307  WBC 29.7* 30.7*  --  34.8* 20.5* 15.0*  LATICACIDVEN 2.4*  2.1* 3.9* 1.9  --   --   --     Microbiology Recent Results (from the past 240 hour(s))  Resp panel by RT-PCR (RSV, Flu A&B, Covid) Anterior Nasal Swab     Status: None   Collection Time: 05/12/22 12:49 PM   Specimen: Anterior Nasal Swab  Result Value Ref Range Status   SARS Coronavirus 2 by RT PCR NEGATIVE NEGATIVE Final    Comment: (NOTE) SARS-CoV-2 target nucleic acids are NOT DETECTED.  The SARS-CoV-2 RNA is generally detectable in upper respiratory specimens during the acute phase of infection. The lowest concentration of SARS-CoV-2 viral copies this assay can detect is 138 copies/mL. A negative result does not preclude SARS-Cov-2 infection and should not be used as the sole basis for treatment or other patient management decisions. A negative result may occur with  improper specimen collection/handling, submission of specimen other than nasopharyngeal swab, presence of viral mutation(s) within the areas targeted by this assay, and inadequate number of viral copies(<138 copies/mL). A negative result must be combined with clinical observations, patient history, and epidemiological information. The expected result is Negative.  Fact Sheet for Patients:  EntrepreneurPulse.com.au  Fact Sheet  for Healthcare Providers:  IncredibleEmployment.be  This test is no t yet approved or cleared by the [place] and  has been authorized for detection and/or diagnosis of SARS-CoV-2 by FDA under an Emergency Use Authorization (EUA). This EUA will remain  in effect (meaning this test can be used) for the  duration of the COVID-19 declaration under Section 564(b)(1) of the Act, 21 U.S.C.section 360bbb-3(b)(1), unless the authorization is terminated  or revoked sooner.       Influenza A by PCR NEGATIVE NEGATIVE Final   Influenza B by PCR NEGATIVE NEGATIVE Final    Comment: (NOTE) The Xpert Xpress SARS-CoV-2/FLU/RSV plus assay is intended as an aid in the diagnosis of influenza from Nasopharyngeal swab specimens and should not be used as a sole basis for treatment. Nasal washings and aspirates are unacceptable for Xpert Xpress SARS-CoV-2/FLU/RSV testing.  Fact Sheet for Patients: EntrepreneurPulse.com.au  Fact Sheet for Healthcare Providers: IncredibleEmployment.be  This test is not yet approved or cleared by the [place] FDA and has been authorized for detection and/or diagnosis of SARS-CoV-2 by FDA under an Emergency Use Authorization (EUA). This EUA will remain in effect (meaning this test can be used) for the duration of the COVID-19 declaration under Section 564(b)(1) of the Act, 21 U.S.C. section 360bbb-3(b)(1), unless the authorization is terminated or revoked.     Resp Syncytial Virus by PCR NEGATIVE NEGATIVE Final    Comment: (NOTE) Fact Sheet for Patients: EntrepreneurPulse.com.au  Fact Sheet for Healthcare Providers: IncredibleEmployment.be  This test is not yet approved or cleared by the [place] FDA and has been authorized for detection and/or diagnosis of SARS-CoV-2 by FDA under an Emergency Use Authorization (EUA). This EUA will remain in effect (meaning this test can be used) for the duration of the COVID-19 declaration under Section 564(b)(1) of the Act, 21 U.S.C. section 360bbb-3(b)(1), unless the authorization is terminated or revoked.  Performed at [facility], [address] [place], [place] [address]   Blood Culture (routine x 2)     Status: None (Preliminary  result)   Collection Time: 05/12/22 12:49 PM   Specimen: BLOOD  Result Value Ref Range Status   Specimen Description BLOOD LEFT ANTECUBITAL  Final   Special Requests BOTTLES DRAWN AEROBIC AND ANAEROBIC BCLV  Final   Culture   Final    NO GROWTH 4 DAYS Performed at [facility], [address] [place], [place] [address]    Report Status PENDING  Incomplete  Blood Culture (routine x 2)     Status: None (Preliminary result)   Collection Time: 05/12/22 12:49 PM   Specimen: BLOOD  Result Value Ref Range Status   Specimen Description BLOOD LEFT HAND  Final   Special Requests BOTTLES DRAWN AEROBIC AND ANAEROBIC BCAV  Final   Culture   Final    NO GROWTH 4 DAYS Performed at [facility], [address], [place], [place] [address]    Report Status PENDING  Incomplete  Urine Culture     Status: None   Collection Time: 05/12/22  1:34 PM   Specimen: In/Out Cath Urine  Result Value Ref Range Status   Specimen Description   Final    IN/OUT CATH URINE Performed at [facility], [address] [place], [place] [address]    Special Requests   Final    NONE Performed at [facility], [address], [place], [place] [address]    Culture   Final    NO GROWTH  Performed at [facility], [place] [address] [place], [place] [address]    Report Status 05/13/2022 FINAL  Final  Expectorated Sputum Assessment w Gram Stain, Rflx to Resp Cult     Status: None   Collection Time: 05/12/22  5:33 PM   Specimen: Expectorated Sputum  Result Value Ref Range Status   Specimen Description EXPECTORATED SPUTUM  Final   Special Requests NONE  Final   Sputum evaluation   Final    THIS SPECIMEN IS ACCEPTABLE FOR SPUTUM CULTURE Performed at [facility], [address] [place], [place] [address]    Report Status 05/12/2022 FINAL  Final  Respiratory (~20 pathogens) panel by PCR     Status: None   Collection Time: 05/12/22  5:33 PM   Specimen:  Nasopharyngeal Swab; Respiratory  Result Value Ref Range Status   Adenovirus NOT DETECTED NOT DETECTED Final   Coronavirus 229E NOT DETECTED NOT DETECTED Final    Comment: (NOTE) The Coronavirus on the Respiratory Panel, DOES NOT test for the novel  Coronavirus (2019 nCoV)    Coronavirus HKU1 NOT DETECTED NOT DETECTED Final   Coronavirus NL63 NOT DETECTED NOT DETECTED Final   Coronavirus OC43 NOT DETECTED NOT DETECTED Final   Metapneumovirus NOT DETECTED NOT DETECTED Final   Rhinovirus / Enterovirus NOT DETECTED NOT DETECTED Final   Influenza A NOT DETECTED NOT DETECTED Final   Influenza B NOT DETECTED NOT DETECTED Final   Parainfluenza Virus 1 NOT DETECTED NOT DETECTED Final   Parainfluenza Virus 2 NOT DETECTED NOT DETECTED Final   Parainfluenza Virus 3 NOT DETECTED NOT DETECTED Final   Parainfluenza Virus 4 NOT DETECTED NOT DETECTED Final   Respiratory Syncytial Virus NOT DETECTED NOT DETECTED Final   Bordetella pertussis NOT DETECTED NOT DETECTED Final   Bordetella Parapertussis NOT DETECTED NOT DETECTED Final   Chlamydophila pneumoniae NOT DETECTED NOT DETECTED Final   Mycoplasma pneumoniae NOT DETECTED NOT DETECTED Final    Comment: Performed at [facility], [place] [address] [place], [place] [address]  Culture, Respiratory w Gram Stain     Status: None (Preliminary result)   Collection Time: 05/12/22  5:33 PM  Result Value Ref Range Status   Specimen Description   Final    EXPECTORATED SPUTUM Performed at [facility], [address] [place], [place] [address]    Special Requests   Final    NONE Reflexed from [id number] Performed at [place], [place] [address]    Gram Stain   Final    MODERATE WBC PRESENT,BOTH PMN AND MONONUCLEAR MODERATE GRAM POSITIVE COCCI IN PAIRS FEW GRAM NEGATIVE RODS RARE SQUAMOUS EPITHELIAL CELLS PRESENT Performed at [facility], [place] [address] [place], [place] [address]    Culture FEW  STAPHYLOCOCCUS AUREUS  Final   Report Status PENDING  Incomplete   Organism ID, Bacteria STAPHYLOCOCCUS AUREUS  Final      Susceptibility   Staphylococcus aureus - MIC*    CIPROFLOXACIN <=0.5 SENSITIVE Sensitive     ERYTHROMYCIN <=0.25 SENSITIVE Sensitive     GENTAMICIN <=0.5 SENSITIVE Sensitive     OXACILLIN 0.5 SENSITIVE Sensitive     TETRACYCLINE <=1 SENSITIVE Sensitive     VANCOMYCIN 1 SENSITIVE Sensitive     TRIMETH/SULFA <=10 SENSITIVE Sensitive     CLINDAMYCIN <=0.25 SENSITIVE Sensitive     RIFAMPIN <=0.5 SENSITIVE Sensitive     Inducible Clindamycin NEGATIVE Sensitive     * FEW STAPHYLOCOCCUS AUREUS  MRSA Next Gen by PCR, Nasal     Status: None   Collection Time: 05/14/22  5:36 PM   Specimen: Nasal Mucosa; Nasal Swab  Result Value Ref Range Status   MRSA by PCR Next Gen NOT DETECTED NOT DETECTED Final    Comment: (NOTE) The GeneXpert MRSA Assay (FDA approved for NASAL specimens only), is one component of a comprehensive MRSA colonization surveillance program. It is not intended to diagnose MRSA infection nor to guide or monitor treatment for MRSA infections. Test performance is not FDA approved in patients less than 16 years old. Performed at [facility], [address] [place], [place] [address]     Procedures and diagnostic studies:  No results found.             LOS: 4 days   Mikaiya Tramble  [facility]   Pager on [email or web address]. If 7PM-7AM, please contact night-coverage at [email or web address]     05/16/2022, 11:41 AM

## 2022-05-16 NOTE — Telephone Encounter (Signed)
Pt appt for tomorrow has been cancelled per Nurse on 1A. Pt is currently in hospital.

## 2022-05-17 ENCOUNTER — Other Ambulatory Visit: Payer: Self-pay

## 2022-05-17 ENCOUNTER — Ambulatory Visit: Payer: BC Managed Care – PPO

## 2022-05-17 DIAGNOSIS — J189 Pneumonia, unspecified organism: Secondary | ICD-10-CM | POA: Diagnosis not present

## 2022-05-17 DIAGNOSIS — I2489 Other forms of acute ischemic heart disease: Secondary | ICD-10-CM

## 2022-05-17 DIAGNOSIS — A419 Sepsis, unspecified organism: Secondary | ICD-10-CM | POA: Diagnosis not present

## 2022-05-17 DIAGNOSIS — J9601 Acute respiratory failure with hypoxia: Secondary | ICD-10-CM | POA: Diagnosis not present

## 2022-05-17 LAB — CULTURE, BLOOD (ROUTINE X 2)
Culture: NO GROWTH
Culture: NO GROWTH

## 2022-05-17 LAB — BASIC METABOLIC PANEL
Anion gap: 8 (ref 5–15)
BUN: 12 mg/dL (ref 8–23)
CO2: 24 mmol/L (ref 22–32)
Calcium: 8.8 mg/dL — ABNORMAL LOW (ref 8.9–10.3)
Chloride: 102 mmol/L (ref 98–111)
Creatinine, Ser: 0.7 mg/dL (ref 0.44–1.00)
GFR, Estimated: >60 mL/min (ref >60)
Glucose, Bld: 115 mg/dL — ABNORMAL HIGH (ref 70–99)
Potassium: 3.5 mmol/L (ref 3.5–5.1)
Sodium: 134 mmol/L — ABNORMAL LOW (ref 135–145)

## 2022-05-17 LAB — CBC WITH DIFFERENTIAL/PLATELET
Abs Immature Granulocytes: 0.24 10*3/uL — ABNORMAL HIGH (ref 0.00–0.07)
Basophils Absolute: 0.1 10*3/uL (ref 0.0–0.1)
Basophils Relative: 1 %
Eosinophils Absolute: 0.1 10*3/uL (ref 0.0–0.5)
Eosinophils Relative: 1 %
HCT: 28.5 % — ABNORMAL LOW (ref 36.0–46.0)
Hemoglobin: 9.3 g/dL — ABNORMAL LOW (ref 12.0–15.0)
Immature Granulocytes: 2 %
Lymphocytes Relative: 6 %
Lymphs Abs: 0.8 10*3/uL (ref 0.7–4.0)
MCH: 32.3 pg (ref 26.0–34.0)
MCHC: 32.6 g/dL (ref 30.0–36.0)
MCV: 99 fL (ref 80.0–100.0)
Monocytes Absolute: 1 10*3/uL (ref 0.1–1.0)
Monocytes Relative: 7 %
Neutro Abs: 12 10*3/uL — ABNORMAL HIGH (ref 1.7–7.7)
Neutrophils Relative %: 83 %
Platelets: 248 10*3/uL (ref 150–400)
RBC: 2.88 MIL/uL — ABNORMAL LOW (ref 3.87–5.11)
RDW: 15 % (ref 11.5–15.5)
WBC: 14.2 10*3/uL — ABNORMAL HIGH (ref 4.0–10.5)
nRBC: 0 % (ref 0.0–0.2)

## 2022-05-17 LAB — LEGIONELLA PNEUMOPHILA SEROGP 1 UR AG: L. pneumophila Serogp 1 Ur Ag: NEGATIVE

## 2022-05-17 MED ORDER — ALBUTEROL SULFATE HFA 108 (90 BASE) MCG/ACT IN AERS: 2.0000 | INHALATION_SPRAY | Freq: Four times a day (QID) | RESPIRATORY_TRACT | 2 refills | Status: DC | PRN

## 2022-05-17 MED ORDER — UMECLIDINIUM BROMIDE 62.5 MCG/ACT IN AEPB
1.0000 | INHALATION_SPRAY | Freq: Every day | RESPIRATORY_TRACT | Status: DC
  Administered 2022-05-17: 1 via RESPIRATORY_TRACT
  Filled 2022-05-17: qty 7

## 2022-05-17 MED ORDER — AMOXICILLIN-POT CLAVULANATE 875-125 MG PO TABS
1.0000 | ORAL_TABLET | Freq: Two times a day (BID) | ORAL | Status: DC
  Administered 2022-05-17: 1 via ORAL
  Filled 2022-05-17: qty 1

## 2022-05-17 MED ORDER — UMECLIDINIUM BROMIDE 62.5 MCG/ACT IN AEPB: 1.0000 | INHALATION_SPRAY | Freq: Every day | RESPIRATORY_TRACT | 0 refills | Status: DC

## 2022-05-17 MED ORDER — AMOXICILLIN-POT CLAVULANATE 875-125 MG PO TABS: 1.0000 | ORAL_TABLET | Freq: Two times a day (BID) | ORAL | 0 refills | Status: AC

## 2022-05-17 NOTE — Discharge Summary (Addendum)
Physician Discharge Summary   Patient: Sabrina Wilson MRN: [id number] DOB: October 18, 1940  Admit date:     05/12/2022  Discharge date: 05/17/22  Discharge Physician: Jennye Boroughs   PCP: Leone Haven, MD   Recommendations at discharge:   Follow-up with PCP in 1 to 2 weeks Follow-up with Dr. Darrall Dears, oncologist, as scheduled Follow-up with pulmonologist for lung function test  Discharge Diagnoses: Principal Problem:   Acute hypoxic respiratory failure ([place]) Active Problems:   Demand ischemia   Carcinoma of overlapping sites of right breast in female, estrogen receptor positive ([place])   Hypertension   Bilateral pneumonia   Severe sepsis ([place])  Resolved Problems:   * No resolved hospital problems. [facility] Course:  Sabrina Wilson is a 82 y.o. female  with medical history significant of stage II breast cancer on Taxotere/Cytoxin, colon cancer s/p colectomy, chronic diastolic CHF, hypertension, ex-smoker/history of tobacco use disorder with about 50-pack-year smoking history (quit smoking about 15 years ago).  She presented to the hospital with shortness of breath for about a week since her last chemotherapy infusion on 04/11/2022.  She complained of productive cough and fever at home.  She also reported nonbloody diarrhea-started about a week prior to admission.  She started taking Levaquin on 05/09/2022 and took it for 3 days.  She presented to the hospital because of worsening shortness of breath and cough.  Diarrhea had resolved prior to admission.  She has a 30-pack-year smoking history.     She was febrile in the emergency room with temperature of 100.6 F, tachypneic, tachycardic, hypoxic with oxygen saturation of 88% on room air. Lactic acid was 2.4 and went up to 3.9.  She also had leukocytosis with WBC of 29.7.  She was admitted to the hospital for severe sepsis secondary to bilateral pneumonia complicated by acute hypoxic respiratory failure.  She was treated with IV  antibiotics (initially with cefepime, metronidazole, vancomycin and subsequently with ceftriaxone and azithromycin).   Her condition has improved and she is deemed stable for discharge today.  Discharge plan was discussed with the patient and her husband at the bedside.   Assessment and Plan:   Severe sepsis secondary to bilateral pneumonia, leukocytosis, immunocompromised state: Leukocytosis is improving.  She will be discharged home on Augmentin.  MRSA PCR screen was negative.  Strep pneumo and Legionella urine antigen were negative   Acute hypoxic respiratory failure: Resolved.  She is tolerating room air even with ambulation.  Initially required BiPAP on admission.    Suspected COPD exacerbation.  She will be discharged on prednisone, albuterol inhaler as needed and umeclidinium inhaler.  Outpatient follow-up with pulmonologist was recommended for lung function test.  She has over 30-pack-year smoking history.   Elevated troponins: This is likely demand ischemia.  No chest pain.     Hypokalemia: Improved   Constipation: Laxatives as needed.     Carcinoma of overlapping sites of right breast in female, estrogen receptor positive [facility]): Recently completed 4 courses of Taxotere-cytotoxin with plans to initiate radiation therapy shortly.  Outpatient follow-up with oncologist     Mildly elevated BNP: 2D echo in May 2022 showed EF estimated at 60 to 31%, grade 1 diastolic dysfunction           Consultants: None Procedures performed: None  Disposition: Home Diet recommendation:  Discharge Diet Orders (From admission, onward)     Start     Ordered   05/17/22 0000  Diet - low sodium heart healthy  05/17/22 1129           Cardiac diet DISCHARGE MEDICATION: Allergies as of 05/17/2022       Reactions   Sulfa Antibiotics Hives   As a child        Medication List     STOP taking these medications    clobetasol cream 0.05 % Commonly known as: TEMOVATE    levofloxacin 500 MG tablet Commonly known as: Levaquin       TAKE these medications    albuterol 108 (90 Base) MCG/ACT inhaler Commonly known as: VENTOLIN HFA Inhale 2 puffs into the lungs every 6 (six) hours as needed for wheezing or shortness of breath.   amoxicillin-clavulanate 875-125 MG tablet Commonly known as: AUGMENTIN Take 1 tablet by mouth every 12 (twelve) hours for 2 days.   atorvastatin 10 MG tablet Commonly known as: LIPITOR Take 1 tablet (10 mg total) by mouth daily.   CALCIUM 1200 PO Take 1 tablet by mouth daily.   FISH OIL PO Take 1 capsule by mouth daily.   hydrochlorothiazide 12.5 MG tablet Commonly known as: HYDRODIURIL TAKE ONE TABLET BY MOUTH DAILY   niacinamide 500 MG tablet Take 500 mg by mouth 2 (two) times daily.   ondansetron 8 MG tablet Commonly known as: ZOFRAN One pill every 8 hours as needed for nausea/vomitting.   Opzelura 1.5 % Crea Generic drug: Ruxolitinib Phosphate Apply 1 application topically daily. What changed: additional instructions   prochlorperazine 10 MG tablet Commonly known as: COMPAZINE Take 1 tablet (10 mg total) by mouth every 6 (six) hours as needed for nausea or vomiting.   umeclidinium bromide 62.5 MCG/ACT Aepb Commonly known as: INCRUSE ELLIPTA Inhale 1 puff into the lungs daily.   VITAMIN D (CHOLECALCIFEROL) PO Take 2,000 Units by mouth daily.        Discharge Exam: Filed Weights   05/12/22 1729  Weight: 80.3 kg   GEN: NAD SKIN: Warm and dry EYES: EOMI ENT: MMM CV: RRR PULM: Occasional rhonchi at the lung bases ABD: soft, ND, NT, +BS CNS: AAO x 3, non focal EXT: No edema or tenderness   Condition at discharge: good  The results of significant diagnostics from this hospitalization (including imaging, microbiology, ancillary and laboratory) are listed below for reference.   Imaging Studies: CT Abdomen Pelvis W Contrast  Result Date: 05/12/2022 CLINICAL DATA:  Abdominal pain EXAM: CT  ABDOMEN AND PELVIS WITH CONTRAST TECHNIQUE: Multidetector CT imaging of the abdomen and pelvis was performed using the standard protocol following bolus administration of intravenous contrast. RADIATION DOSE REDUCTION: This exam was performed according to the departmental dose-optimization program which includes automated exposure control, adjustment of the mA and/or kV according to patient size and/or use of iterative reconstruction technique. CONTRAST:  167m OMNIPAQUE IOHEXOL 350 MG/ML SOLN COMPARISON:  06/12/2012 FINDINGS: Examination of the abdomen pelvis is generally limited by breath motion artifact. Lower chest: Please see separately reported examination of the chest. Hepatobiliary: No solid liver abnormality is seen. Tiny gallstones and or sludge (series 2, image 32). No gallbladder wall thickening, or biliary dilatation. Pancreas: Unremarkable. No pancreatic ductal dilatation or surrounding inflammatory changes. Spleen: Normal in size without significant abnormality. Adrenals/Urinary Tract: Adrenal glands are unremarkable. Kidneys are normal, without renal calculi, solid lesion, or hydronephrosis. Bladder is unremarkable. Stomach/Bowel: Stomach is within normal limits. Appendix appears normal. No evidence of bowel wall thickening, distention, or inflammatory changes. Evidence of prior sigmoid colon resection and reanastomosis. Vascular/Lymphatic: Aortic atherosclerosis. No enlarged abdominal or pelvic lymph  nodes. Reproductive: Calcified uterine fibroids. Right ovarian cyst measuring 3.4 x 2.2 cm (series 2, image 67). This is unchanged compared to remote prior examination dated 2014 and definitively benign. No further follow-up or characterization is required. No follow-up imaging recommended. Note: This recommendation does not apply to premenarchal patients and to those with increased risk (genetic, family history, elevated tumor markers or other high-risk factors) of ovarian cancer. Reference: JACR 2020  Feb; 17(2):248-254 Other: No abdominal wall hernia or abnormality. No ascites. Musculoskeletal: No acute or significant osseous findings. IMPRESSION: 1. Examination of the abdomen and pelvis is generally limited by breath motion artifact. 2. Within this limitation, no acute CT findings of the abdomen or pelvis to explain abdominal pain. 3. Tiny gallstones and or sludge. No evidence of acute cholecystitis. 4. Calcified uterine fibroids. Aortic Atherosclerosis (ICD10-I70.0). Electronically Signed   By: Delanna Ahmadi M.D.   On: 05/12/2022 14:40   CT Angio Chest PE W/Cm &/Or Wo Cm  Result Date: 05/12/2022 CLINICAL DATA:  PE suspected, shortness of breath increasing for 1 week EXAM: CT ANGIOGRAPHY CHEST WITH CONTRAST TECHNIQUE: Multidetector CT imaging of the chest was performed using the standard protocol during bolus administration of intravenous contrast. Multiplanar CT image reconstructions and MIPs were obtained to evaluate the vascular anatomy. RADIATION DOSE REDUCTION: This exam was performed according to the departmental dose-optimization program which includes automated exposure control, adjustment of the mA and/or kV according to patient size and/or use of iterative reconstruction technique. CONTRAST:  13m OMNIPAQUE IOHEXOL 350 MG/ML SOLN COMPARISON:  None Available. FINDINGS: Cardiovascular: Satisfactory opacification of the pulmonary arteries to the segmental level. No evidence of pulmonary embolism. Normal heart size. Right coronary artery calcifications. No pericardial effusion. Aortic atherosclerosis. Mediastinum/Nodes: No enlarged mediastinal, hilar, or axillary lymph nodes. Thyroid gland, trachea, and esophagus demonstrate no significant findings. Lungs/Pleura: Diffuse bilateral bronchial wall thickening. Background of very fine centrilobular pulmonary nodules. Bandlike atelectasis or consolidation of the posterior lingula (series 5, 41). No pleural effusion or pneumothorax. Upper Abdomen: Please  see separately reported examination of the abdomen and pelvis. Musculoskeletal: No chest wall abnormality. No acute osseous findings. Review of the MIP images confirms the above findings. IMPRESSION: 1. Negative examination for pulmonary embolism. 2. Diffuse bilateral bronchial wall thickening, consistent with nonspecific infectious or inflammatory bronchitis. 3. Background of very fine centrilobular pulmonary nodules, most commonly seen in smoking-related respiratory bronchiolitis although generally nonspecific and infectious or inflammatory. 4. Coronary artery disease. Aortic Atherosclerosis (ICD10-I70.0). Electronically Signed   By: ADelanna AhmadiM.D.   On: 05/12/2022 14:34   DG Chest Port 1 View  Result Date: 05/12/2022 CLINICAL DATA:  Shortness of breath for 1 week following chemotherapy EXAM: PORTABLE CHEST 1 VIEW COMPARISON:  Chest radiograph 08/18/2020 FINDINGS: The heart is at the upper limits of normal for size. The mediastinal contours are normal. There is no focal consolidation or pulmonary edema. There is no pleural effusion or pneumothorax There is no acute osseous abnormality. IMPRESSION: No radiographic evidence of acute cardiopulmonary process. Electronically Signed   By: PValetta MoleM.D.   On: 05/12/2022 13:04    Microbiology: Results for orders placed or performed during the hospital encounter of 05/12/22  Resp panel by RT-PCR (RSV, Flu A&B, Covid) Anterior Nasal Swab     Status: None   Collection Time: 05/12/22 12:49 PM   Specimen: Anterior Nasal Swab  Result Value Ref Range Status   SARS Coronavirus 2 by RT PCR NEGATIVE NEGATIVE Final    Comment: (NOTE) SARS-CoV-2 target nucleic acids  are NOT DETECTED.  The SARS-CoV-2 RNA is generally detectable in upper respiratory specimens during the acute phase of infection. The lowest concentration of SARS-CoV-2 viral copies this assay can detect is 138 copies/mL. A negative result does not preclude SARS-Cov-2 infection and should not  be used as the sole basis for treatment or other patient management decisions. A negative result may occur with  improper specimen collection/handling, submission of specimen other than nasopharyngeal swab, presence of viral mutation(s) within the areas targeted by this assay, and inadequate number of viral copies(<138 copies/mL). A negative result must be combined with clinical observations, patient history, and epidemiological information. The expected result is Negative.  Fact Sheet for Patients:  EntrepreneurPulse.com.au  Fact Sheet for Healthcare Providers:  IncredibleEmployment.be  This test is no t yet approved or cleared by the [place] FDA and  has been authorized for detection and/or diagnosis of SARS-CoV-2 by FDA under an Emergency Use Authorization (EUA). This EUA will remain  in effect (meaning this test can be used) for the duration of the COVID-19 declaration under Section 564(b)(1) of the Act, 21 U.S.C.section 360bbb-3(b)(1), unless the authorization is terminated  or revoked sooner.       Influenza A by PCR NEGATIVE NEGATIVE Final   Influenza B by PCR NEGATIVE NEGATIVE Final    Comment: (NOTE) The Xpert Xpress SARS-CoV-2/FLU/RSV plus assay is intended as an aid in the diagnosis of influenza from Nasopharyngeal swab specimens and should not be used as a sole basis for treatment. Nasal washings and aspirates are unacceptable for Xpert Xpress SARS-CoV-2/FLU/RSV testing.  Fact Sheet for Patients: EntrepreneurPulse.com.au  Fact Sheet for Healthcare Providers: IncredibleEmployment.be  This test is not yet approved or cleared by the [place] FDA and has been authorized for detection and/or diagnosis of SARS-CoV-2 by FDA under an Emergency Use Authorization (EUA). This EUA will remain in effect (meaning this test can be used) for the duration of the COVID-19 declaration under Section  564(b)(1) of the Act, 21 U.S.C. section 360bbb-3(b)(1), unless the authorization is terminated or revoked.     Resp Syncytial Virus by PCR NEGATIVE NEGATIVE Final    Comment: (NOTE) Fact Sheet for Patients: EntrepreneurPulse.com.au  Fact Sheet for Healthcare Providers: IncredibleEmployment.be  This test is not yet approved or cleared by the [place] FDA and has been authorized for detection and/or diagnosis of SARS-CoV-2 by FDA under an Emergency Use Authorization (EUA). This EUA will remain in effect (meaning this test can be used) for the duration of the COVID-19 declaration under Section 564(b)(1) of the Act, 21 U.S.C. section 360bbb-3(b)(1), unless the authorization is terminated or revoked.  Performed at [facility], [address] [place], [place] [address]   Blood Culture (routine x 2)     Status: None   Collection Time: 05/12/22 12:49 PM   Specimen: BLOOD  Result Value Ref Range Status   Specimen Description BLOOD LEFT ANTECUBITAL  Final   Special Requests BOTTLES DRAWN AEROBIC AND ANAEROBIC BCLV  Final   Culture   Final    NO GROWTH 5 DAYS Performed at [facility], [address] [place], [place] [address]    Report Status 05/17/2022 FINAL  Final  Blood Culture (routine x 2)     Status: None   Collection Time: 05/12/22 12:49 PM   Specimen: BLOOD  Result Value Ref Range Status   Specimen Description BLOOD LEFT HAND  Final   Special Requests BOTTLES DRAWN AEROBIC AND ANAEROBIC BCAV  Final   Culture  Final    NO GROWTH 5 DAYS Performed at [facility], [address] [place], [place] [address]    Report Status 05/17/2022 FINAL  Final  Urine Culture     Status: None   Collection Time: 05/12/22  1:34 PM   Specimen: In/Out Cath Urine  Result Value Ref Range Status   Specimen Description   Final    IN/OUT CATH URINE Performed at [facility], [address] [place], [place]  [address]    Special Requests   Final    NONE Performed at [facility], [address], [place], [place] [address]    Culture   Final    NO GROWTH Performed at [facility], [place] [address] [place], [place] [address]    Report Status 05/13/2022 FINAL  Final  Expectorated Sputum Assessment w Gram Stain, Rflx to Resp Cult     Status: None   Collection Time: 05/12/22  5:33 PM   Specimen: Expectorated Sputum  Result Value Ref Range Status   Specimen Description EXPECTORATED SPUTUM  Final   Special Requests NONE  Final   Sputum evaluation   Final    THIS SPECIMEN IS ACCEPTABLE FOR SPUTUM CULTURE Performed at [facility], [address] [place], [place] [address]    Report Status 05/12/2022 FINAL  Final  Respiratory (~20 pathogens) panel by PCR     Status: None   Collection Time: 05/12/22  5:33 PM   Specimen: Nasopharyngeal Swab; Respiratory  Result Value Ref Range Status   Adenovirus NOT DETECTED NOT DETECTED Final   Coronavirus 229E NOT DETECTED NOT DETECTED Final    Comment: (NOTE) The Coronavirus on the Respiratory Panel, DOES NOT test for the novel  Coronavirus (2019 nCoV)    Coronavirus HKU1 NOT DETECTED NOT DETECTED Final   Coronavirus NL63 NOT DETECTED NOT DETECTED Final   Coronavirus OC43 NOT DETECTED NOT DETECTED Final   Metapneumovirus NOT DETECTED NOT DETECTED Final   Rhinovirus / Enterovirus NOT DETECTED NOT DETECTED Final   Influenza A NOT DETECTED NOT DETECTED Final   Influenza B NOT DETECTED NOT DETECTED Final   Parainfluenza Virus 1 NOT DETECTED NOT DETECTED Final   Parainfluenza Virus 2 NOT DETECTED NOT DETECTED Final   Parainfluenza Virus 3 NOT DETECTED NOT DETECTED Final   Parainfluenza Virus 4 NOT DETECTED NOT DETECTED Final   Respiratory Syncytial Virus NOT DETECTED NOT DETECTED Final   Bordetella pertussis NOT DETECTED NOT DETECTED Final   Bordetella Parapertussis NOT DETECTED NOT DETECTED Final   Chlamydophila pneumoniae NOT  DETECTED NOT DETECTED Final   Mycoplasma pneumoniae NOT DETECTED NOT DETECTED Final    Comment: Performed at [facility], [place] [address], [place], [place] [address]  Culture, Respiratory w Gram Stain     Status: None   Collection Time: 05/12/22  5:33 PM  Result Value Ref Range Status   Specimen Description   Final    EXPECTORATED SPUTUM Performed at [facility], [address] [place], [place] [address]    Special Requests   Final    NONE Reflexed from [id number] Performed at [place], [place] [address]    Gram Stain   Final    MODERATE WBC PRESENT,BOTH PMN AND MONONUCLEAR MODERATE GRAM POSITIVE COCCI IN PAIRS FEW GRAM NEGATIVE RODS RARE SQUAMOUS EPITHELIAL CELLS PRESENT Performed at [facility], [place] [address], [place],  [address]    Culture FEW STAPHYLOCOCCUS AUREUS  Final   Report Status 05/16/2022 FINAL  Final   Organism ID, Bacteria STAPHYLOCOCCUS AUREUS  Final      Susceptibility   Staphylococcus aureus - MIC*    CIPROFLOXACIN <=0.5 SENSITIVE Sensitive     ERYTHROMYCIN <=0.25 SENSITIVE Sensitive     GENTAMICIN <=0.5 SENSITIVE Sensitive     OXACILLIN 0.5 SENSITIVE Sensitive     TETRACYCLINE <=1 SENSITIVE Sensitive     VANCOMYCIN 1 SENSITIVE Sensitive     TRIMETH/SULFA <=10 SENSITIVE Sensitive     CLINDAMYCIN <=0.25 SENSITIVE Sensitive     RIFAMPIN <=0.5 SENSITIVE Sensitive     Inducible Clindamycin NEGATIVE Sensitive     * FEW STAPHYLOCOCCUS AUREUS  MRSA Next Gen by PCR, Nasal     Status: None   Collection Time: 05/14/22  5:36 PM   Specimen: Nasal Mucosa; Nasal Swab  Result Value Ref Range Status   MRSA by PCR Next Gen NOT DETECTED NOT DETECTED Final    Comment: (NOTE) The GeneXpert MRSA Assay (FDA approved for NASAL specimens only), is one component of a comprehensive MRSA colonization surveillance program. It is not intended to diagnose MRSA infection nor to guide or monitor treatment for MRSA  infections. Test performance is not FDA approved in patients less than 36 years old. Performed at [facility], [address] [place],  [address]     Labs: CBC: Recent Labs  Lab 05/13/22 [phone] 05/14/22 0409 05/15/22 0412 05/16/22 0307 05/17/22 0253  WBC 30.7* 34.8* 20.5* 15.0* 14.2*  NEUTROABS 26.3* 29.6* 17.2* 12.5* 12.0*  HGB 9.0* 8.7* 8.8* 9.1* 9.3*  HCT 28.4* 26.8* 27.2* 28.3* 28.5*  MCV 103.6* 100.0 99.3 100.4* 99.0  PLT 240 241 253 232 657   Basic Metabolic Panel: Recent Labs  Lab 05/11/22 0944 05/12/22 1334 05/13/22 0420 05/13/22 1333 05/14/22 0409 05/15/22 0412 05/17/22 0253  NA 131* 130* 134*  --  136  --  134*  K 3.6 3.4* 3.1*  --  3.8  --  3.5  CL 93* 93* 100  --  104  --  102  CO2 '28 27 24  '$ --  26  --  24  GLUCOSE 122* 134* 263*  --  138*  --  115*  BUN '10 10 10  '$ --  17  --  12  CREATININE 0.82 0.75 0.90  --  0.80 0.70 0.70  CALCIUM 9.3 9.6 8.8*  --  9.1  --  8.8*  MG 1.5* 1.9  --  1.8 2.1  --   --    Liver Function Tests: Recent Labs  Lab 05/11/22 0944 05/12/22 1334 05/13/22 0420  AST 24 23 42*  ALT '13 14 15  '$ ALKPHOS 74 97 78  BILITOT 0.7 0.6 0.4  PROT 6.3* 6.8 5.8*  ALBUMIN 3.1* 3.4* 2.9*   CBG: No results for input(s): "GLUCAP" in the last 168 hours.  Discharge time spent: greater than 30 minutes.  Signed: Jennye Boroughs, MD [facility] 05/17/2022

## 2022-05-17 NOTE — Care Management Important Message (Signed)
Important Message  Patient Details  Name: Sabrina Wilson MRN: [id number] Date of Birth: 09-Mar-1941   Medicare Important Message Given:  Yes     Juliann Pulse A Adon Gehlhausen 05/17/2022, 11:31 AM

## 2022-05-17 NOTE — Plan of Care (Signed)
  Problem: Clinical Measurements: Goal: Will remain free from infection Outcome: Progressing   Problem: Clinical Measurements: Goal: Cardiovascular complication will be avoided Outcome: Progressing   Problem: Coping: Goal: Level of anxiety will decrease Outcome: Progressing   Problem: Elimination: Goal: Will not experience complications related to bowel motility Outcome: Progressing   Problem: Skin Integrity: Goal: Risk for impaired skin integrity will decrease Outcome: Progressing   Problem: Nutrition: Goal: Adequate nutrition will be maintained Outcome: Progressing

## 2022-05-17 NOTE — TOC Transition Note (Signed)
Transition of Care [facility]) - CM/SW Discharge Note   Patient Details  Name: Sabrina Wilson MRN: [id number] Date of Birth: 1941-03-20  [facility] of [facility] CM/SW Contact:  Quin Hoop, LCSW Phone Number: 05/17/2022, 12:11 PM   Clinical Narrative:    Patient discharging home.  To be transported by private vehicle.  NO TOC needs addressed during pt stay.  None needed at discharge.   Final next level of care: Home/Self Care Barriers to Discharge: No Barriers Identified   Patient Goals and CMS Choice      Discharge Placement                         Discharge Plan and Services Additional resources added to the After Visit Summary for                                       Social Determinants of Health (SDOH) Interventions SDOH Screenings   Food Insecurity: No Food Insecurity (05/13/2022)  Housing: Low Risk  (05/13/2022)  Transportation Needs: No Transportation Needs (05/13/2022)  Utilities: Not At Risk (05/13/2022)  Depression (PHQ2-9): Low Risk  (02/11/2022)  Financial Resource Strain: Low Risk  (11/13/2018)  Stress: No Stress Concern Present (11/16/2020)  Tobacco Use: Medium Risk (05/13/2022)     Readmission Risk Interventions     No data to display

## 2022-05-18 ENCOUNTER — Telehealth: Payer: Self-pay | Admitting: *Deleted

## 2022-05-18 ENCOUNTER — Inpatient Hospital Stay: Payer: Medicare Other

## 2022-05-18 NOTE — Patient Outreach (Signed)
  Care Coordination [facility] Note Transition Care Management Unsuccessful Follow-up Telephone Call  Date of discharge and from where:  [facility] [phone] Hypoxia CAP  Attempts:  1st Attempt  Reason for unsuccessful TCM follow-up call:  Left voice message  [place] Management [phone]

## 2022-05-19 ENCOUNTER — Telehealth: Payer: Self-pay

## 2022-05-19 NOTE — Patient Outreach (Signed)
  Care Coordination TOC Note Transition Care Management Follow-up Telephone Call Date of discharge and from where: [facility] 05/17/22 How have you been since you were released from the hospital? "I have been okay, still tired" Any questions or concerns? No  Items Reviewed: Did the pt receive and understand the discharge instructions provided? Yes  Medications obtained and verified? Yes  Other? No  Any new allergies since your discharge? No  Dietary orders reviewed? No Do you have support at home? Yes   Home Care and Equipment/Supplies: Were home health services ordered? no If so, what is the name of the agency? N/a  Has the agency set up a time to come to the patient's home? no Were any new equipment or medical supplies ordered?  No What is the name of the medical supply agency? N/a Were you able to get the supplies/equipment? not applicable Do you have any questions related to the use of the equipment or supplies? No  Functional Questionnaire: (I = Independent and D = Dependent) ADLs: I  Bathing/Dressing- I  Meal Prep- I  Eating- I  Maintaining continence- I  Transferring/Ambulation- I  Managing Meds- I  Follow up appointments reviewed:  PCP Hospital f/u appt confirmed? Yes  Scheduled to see Dr. Caryl Bis on 05/27/22 @ 2:45. [facility] f/u appt confirmed? No   Are transportation arrangements needed? No  If their condition worsens, is the pt aware to call PCP or go to the Emergency Dept.? Yes Was the patient provided with contact information for the PCP's office or ED? Yes Was to pt encouraged to call back with questions or concerns? Yes  SDOH assessments and interventions completed:   Yes SDOH Interventions Today    Flowsheet Row Most Recent Value  SDOH Interventions   Housing Interventions Intervention Not Indicated  Financial Strain Interventions Intervention Not Indicated       Care Coordination Interventions:  No Care Coordination interventions needed  at this time.   Encounter Outcome:  Pt. Visit Completed

## 2022-05-21 ENCOUNTER — Other Ambulatory Visit: Payer: Self-pay | Admitting: Oncology

## 2022-05-21 MED ORDER — NIRMATRELVIR/RITONAVIR (PAXLOVID)TABLET: 3.0000 | ORAL_TABLET | Freq: Two times a day (BID) | ORAL | 0 refills | Status: AC

## 2022-05-22 ENCOUNTER — Other Ambulatory Visit: Payer: Self-pay

## 2022-05-23 ENCOUNTER — Other Ambulatory Visit: Payer: Self-pay | Admitting: Internal Medicine

## 2022-05-23 ENCOUNTER — Telehealth: Payer: Self-pay | Admitting: Internal Medicine

## 2022-05-23 ENCOUNTER — Inpatient Hospital Stay (HOSPITAL_BASED_OUTPATIENT_CLINIC_OR_DEPARTMENT_OTHER): Payer: Medicare Other | Admitting: Medical Oncology

## 2022-05-23 ENCOUNTER — Ambulatory Visit: Payer: BC Managed Care – PPO

## 2022-05-23 DIAGNOSIS — U071 COVID-19: Secondary | ICD-10-CM

## 2022-05-23 DIAGNOSIS — C50811 Malignant neoplasm of overlapping sites of right female breast: Secondary | ICD-10-CM

## 2022-05-23 DIAGNOSIS — Z170 Estrogen receptor positive status [ER+]: Secondary | ICD-10-CM

## 2022-05-23 DIAGNOSIS — Z79899 Other long term (current) drug therapy: Secondary | ICD-10-CM | POA: Diagnosis not present

## 2022-05-23 DIAGNOSIS — Z5189 Encounter for other specified aftercare: Secondary | ICD-10-CM | POA: Diagnosis not present

## 2022-05-23 DIAGNOSIS — Z9289 Personal history of other medical treatment: Secondary | ICD-10-CM

## 2022-05-23 DIAGNOSIS — Z5111 Encounter for antineoplastic chemotherapy: Secondary | ICD-10-CM | POA: Diagnosis not present

## 2022-05-23 NOTE — Telephone Encounter (Signed)
Patient next lab/MD appt is on 05/30/22 but does have XRT appt on 05/24/22.

## 2022-05-23 NOTE — Telephone Encounter (Signed)
Pt called to report she has tested positive for Covid. She would like to reschedule all appointments through 2/5. Please reach out to patient with reschedule information.

## 2022-05-23 NOTE — Progress Notes (Signed)
Virtual Visit Progress Note  Sabrina Wilson,you are scheduled for a virtual visit with your provider today.    Just as we do with appointments in the office, we must obtain your consent to participate.  Your consent will be active for this visit and any virtual visit you may have with one of our providers in the next 365 days.    If you have a MyChart account, I can also send a copy of this consent to you electronically.  All virtual visits are billed to your insurance company just like a traditional visit in the office.  As this is a virtual visit, video technology does not allow for your provider to perform a traditional examination.  This may limit your provider's ability to fully assess your condition.  If your provider identifies any concerns that need to be evaluated in person or the need to arrange testing such as labs, EKG, etc, we will make arrangements to do so.    Although advances in technology are sophisticated, we cannot ensure that it will always work on either your end or our end.  If the connection with a video visit is poor, we may have to switch to a telephone visit.  With either a video or telephone visit, we are not always able to ensure that we have a secure connection.   I need to obtain your verbal consent now.   Are you willing to proceed with your visit today?   Sabrina Wilson has provided verbal consent on 05/23/2022 for a virtual visit (video or telephone).   Hughie Closs, PA-C 05/23/2022  3:14 PM    I connected with Sabrina Wilson on 05/23/22 at  1:30 PM EST by video enabled telemedicine visit and verified that I am speaking with the correct person using two identifiers.   I discussed the limitations, risks, security and privacy concerns of performing an evaluation and management service by telemedicine and the availability of in-person appointments. I also discussed with the patient that there may be a patient responsible charge related to this service. The  patient expressed understanding and agreed to proceed.   Other persons participating in the visit and their role in the encounter: None  Patient's location: Home Provider's location: Clinic   Oncologic History: stage II breast cancer on Taxotere/Cytoxin - s/p cycle 4 Day 1 on 05/02/2022  Chief Complaint: Immunocompromised, recent CAP/Sepsis/hospitalization, now COVID-53 positive    Patient Care Team: Leone Haven, MD as PCP - General (Family Medicine) Kate Sable, MD as PCP - Cardiology (Cardiology) Bary Castilla, Forest Gleason, MD as Consulting Physician (General Surgery) Dallas Schimke, MD (Internal Medicine) Daiva Huge, RN as Oncology Nurse Navigator   Name of the patient: Sabrina Wilson  [id number]  [birth year]   Date of visit: 05/23/22  History of Presenting Illness-   Patient called into our clinic today to state that she would need to hold off on XRT appointments this week due to COVID-19 positive status. Given recent sepsis a [facility] visit was offered to ensure she was feeling ok. Of note she was recently hospitalized for CAP, severe sepsis, hypoxia from 05/12/2022-05/17/2022. Per the hospital notes she was treated with "IV antibiotics (initially with cefepime, metronidazole, vancomycin and subsequently with ceftriaxone and azithromycin)". She was discharged home on Augmentin as well as prednisone, albuterol and umeclidinium. Although she was originally on BiPAP when she was first admitted she was tolerating room air well when discharged. She is being seen by [facility]  on 06/01/2022.     She states that on Saturday she and her husband tested positive for COVID-19. Only tested because husband had a bit of a sore throat. She states that she just feels a bit fatigued but denies any sore throat, SOB, fever, cough. Her SOB has improved greatly since her hospital stay. Oxygen levels are normal around 96%. She was started on Paxlovid on Sunday 05/22/2022. She is  tolerating this well. Eating/drinking well. No diarrhea/vomiting.    Review of systems- ROS   Allergies  Allergen Reactions   Sulfa Antibiotics Hives    As a child    Past Medical History:  Diagnosis Date   Actinic keratosis 02/12/2020   R lat calf (hypertrophic)   Arthritis    Cancer of sigmoid ([place]) 06/17/2016   Colon cancer ([place]) 2009   T3,N0; s/p resection  Dr. Hulda Humphrey and chemotherapy by Dr. Cynda Acres   Diastolic dysfunction    a. 08/2020 Echo: EF 60-65%, no rwma, Gr1 DD, nl RV size/fxn. Mild MR.   Hernia of flank    History of actinic keratoses 08/11/2020   Bx proven at left lateral ankle proximal, LN2 10/08/20   History of stress test    a. 09/2020 MV: EF >65%. No ischemia/infarct. Mild Ao Ca2+ and minimal Cor Ca2+.   Hypertension    Neuropathy    Polyp at cervical os    s/p resection Dr. Sabra Heck   Skin cancer 01/22/2020   surface of an atypical verrucous squamous proliferation    Squamous cell carcinoma of skin 07/16/2020   Left lateral ankle anterior, treated with [facility] 08-11-2020    Past Surgical History:  Procedure Laterality Date   BREAST BIOPSY Right 05/2014   Dr. Dwyane Luo office-benign   BREAST LUMPECTOMY WITH SENTINEL LYMPH NODE BIOPSY Right 01/21/2022   Procedure: BREAST LUMPECTOMY WITH SENTINEL LYMPH NODE BX;  Surgeon: Robert Bellow, MD;  Location: [facility] ORS;  Service: General;  Laterality: Right;   BREAST SURGERY Right February 2016   Vacuum assisted biopsy for recurrent cyst, fibrocystic changes without evidence of malignancy.   CATARACT EXTRACTION W/PHACO Left 01/13/2015   Procedure: CATARACT EXTRACTION PHACO AND INTRAOCULAR LENS PLACEMENT (IOC);  Surgeon: Birder Robson, MD;  Location: [facility] ORS;  Service: Ophthalmology;  Laterality: Left;  [place]  1:02.9 AP   19.2 CDE  12.06 casette lot #  [id number]   CATARACT EXTRACTION W/PHACO Right 02/03/2015   Procedure: CATARACT EXTRACTION PHACO AND INTRAOCULAR LENS PLACEMENT (IOC);  Surgeon: Birder Robson, MD;   Location: [facility] ORS;  Service: Ophthalmology;  Laterality: Right;  [id number] ap46.5 [id number]   COLECTOMY     COLONOSCOPY  2015   Dr Candace Cruise   COLONOSCOPY WITH PROPOFOL N/A 09/13/2017   Procedure: COLONOSCOPY WITH PROPOFOL;  Surgeon: Robert Bellow, MD;  Location: [facility] ENDOSCOPY;  Service: Endoscopy;  Laterality: N/A;   COLONOSCOPY WITH PROPOFOL N/A 11/08/2017   Procedure: COLONOSCOPY WITH PROPOFOL;  Surgeon: Robert Bellow, MD;  Location: [facility] ENDOSCOPY;  Service: Endoscopy;  Laterality: N/A;   colostomy reversal     DILATION AND CURETTAGE OF UTERUS  2014   Dr. Sabra Heck, benign per pt   EYE SURGERY     HERNIA REPAIR  07/13/12   ventral    SKIN CANCER EXCISION     TONSILLECTOMY      Social History   Socioeconomic History   Marital status: Married    Spouse name: Not on file   Number of children: Not on file   Years of education: Not  on file   Highest education level: Not on file  Occupational History   Not on file  Tobacco Use   Smoking status: Former    Types: Cigarettes    Quit date: 10/12/2007    Years since quitting: 14.6   Smokeless tobacco: Never  Vaping Use   Vaping Use: Never used  Substance and Sexual Activity   Alcohol use: Yes    Alcohol/week: 1.0 standard drink of alcohol    Types: 1 Standard drinks or equivalent per week    Comment: with dinner GLASS OF WINE EACH DAY   Drug use: No   Sexual activity: Not on file  Other Topics Concern   Not on file  Social History Narrative   Lives in [place] with husband. 2 children, son lives nearby.Diet - regularExercise - none. 30 mins/in [place]. Quit smoking in 2009. Wine before dinner. Pianist in church; real estate; Adult nurse.    Social Determinants of Health   Financial Resource Strain: Low Risk  (05/19/2022)   Overall Financial Resource Strain (CARDIA)    Difficulty of Paying Living Expenses: Not very hard  Food Insecurity: No Food Insecurity (05/13/2022)   Hunger Vital Sign    Worried About Running Out of  Food in the Last Year: Never true    Ran Out of Food in the Last Year: Never true  Transportation Needs: No Transportation Needs (05/13/2022)   PRAPARE - Hydrologist (Medical): No    Lack of Transportation (Non-Medical): No  Physical Activity: Not on file  Stress: No Stress Concern Present (11/16/2020)   [place]    Feeling of Stress : Not at all  Social Connections: Not on file  Intimate Partner Violence: Not At Risk (05/13/2022)   Humiliation, Afraid, Rape, and Kick questionnaire    Fear of Current or Ex-Partner: No    Emotionally Abused: No    Physically Abused: No    Sexually Abused: No    Immunization History  Administered Date(s) Administered   Fluad Quad(high Dose 65+) 12/19/2018, 12/21/2018, 01/14/2020, 01/12/2022   Hepb-cpg 12/11/2019, 01/14/2020   Influenza Split 01/19/2011, 03/26/2012   Influenza, High Dose Seasonal PF 02/03/2014, 02/07/2017, 01/31/2018, 01/18/2021   Influenza,inj,Quad PF,6+ Mos 02/07/2013, 01/22/2015, 02/01/2016   Influenza-Unspecified 02/06/2014   Moderna Covid-19 Vaccine Bivalent Booster 22yr & up 01/18/2021, 09/01/2021, 01/23/2022   Moderna Sars-Covid-2 Vaccination 05/03/2019, 05/31/2019, 02/17/2020, 08/11/2020   Pneumococcal Conjugate-13 09/27/2013   Pneumococcal Polysaccharide-23 04/12/2009   Respiratory Syncytial Virus Vaccine,Recomb Aduvanted(Arexvy) 01/12/2022   Tdap 11/26/2019   Zoster Recombinat (Shingrix) 06/11/2018, 08/30/2018   Zoster, Live 02/03/2011    Family History  Problem Relation Age of Onset   Stroke Mother    Diabetes Mother    Cancer Father        colon   Stroke Sister    Cancer Brother        colon   Cancer Brother        brain     Current Outpatient Medications:    albuterol (VENTOLIN HFA) 108 (90 Base) MCG/ACT inhaler, Inhale 2 puffs into the lungs every 6 (six) hours as needed for wheezing or shortness of breath.,  Disp: 8 g, Rfl: 2   atorvastatin (LIPITOR) 10 MG tablet, Take 1 tablet (10 mg total) by mouth daily., Disp: 90 tablet, Rfl: 1   Calcium Carbonate-Vit D-Min (CALCIUM 1200 PO), Take 1 tablet by mouth daily., Disp: , Rfl:    hydrochlorothiazide (HYDRODIURIL) 12.5  MG tablet, TAKE ONE TABLET BY MOUTH DAILY (Patient taking differently: Take 12.5 mg by mouth daily.), Disp: 90 tablet, Rfl: 1   niacinamide 500 MG tablet, Take 500 mg by mouth 2 (two) times daily., Disp: , Rfl:    nirmatrelvir/ritonavir (PAXLOVID) 20 x 150 MG & 10 x '100MG'$  TABS, Take 3 tablets by mouth 2 (two) times daily for 5 days. Patient GFR is >60. Take nirmatrelvir (150 mg) two tablets twice daily for 5 days and ritonavir (100 mg) one tablet twice daily for 5 days., Disp: 30 tablet, Rfl: 0   Omega-3 Fatty Acids (FISH OIL PO), Take 1 capsule by mouth daily., Disp: , Rfl:    ondansetron (ZOFRAN) 8 MG tablet, One pill every 8 hours as needed for nausea/vomitting., Disp: 40 tablet, Rfl: 1   prochlorperazine (COMPAZINE) 10 MG tablet, Take 1 tablet (10 mg total) by mouth every 6 (six) hours as needed for nausea or vomiting., Disp: 40 tablet, Rfl: 1   Ruxolitinib Phosphate (OPZELURA) 1.5 % CREA, Apply 1 application topically daily. (Patient taking differently: Apply 1 application  topically daily. LEGS), Disp: 60 g, Rfl: 2   umeclidinium bromide (INCRUSE ELLIPTA) 62.5 MCG/ACT AEPB, Inhale 1 puff into the lungs daily., Disp: 30 each, Rfl: 0   VITAMIN D, CHOLECALCIFEROL, PO, Take 2,000 Units by mouth daily., Disp: , Rfl:   Physical exam: Exam limited due to telemedicine Physical Exam Constitutional:      General: She is not in acute distress.    Appearance: Normal appearance. She is not ill-appearing, toxic-appearing or diaphoretic.  Pulmonary:     Effort: Pulmonary effort is normal.  Skin:    General: Skin is warm.  Neurological:     Mental Status: She is alert and oriented to person, place, and time.  Psychiatric:        Mood and Affect:  Mood normal.        Behavior: Behavior normal.        Thought Content: Thought content normal.        Judgment: Judgment normal.      Assessment and plan- Patient is a 82 y.o. female    Visit Diagnosis 1. Carcinoma of overlapping sites of right breast in female, estrogen receptor positive ([place])   2. On antineoplastic chemotherapy   3. History of recent hospitalization   4. COVID-19    Glad to hear that she is feeling well overall. She sounds good and oxygen is reassuring. I am glad to hear that she is on Paxlovid to reduce her chances of poor outcomes. We reviewed red flag signs and symptoms. For now, rest, hydration, healthy eating and good quality sleep. We will see her at her follow up in a few days.   Patient expressed understanding and was in agreement with this plan. She also understands that She can call clinic at any time with any questions, concerns, or complaints.   I discussed the assessment and treatment plan with the patient. The patient was provided an opportunity to ask questions and all were answered. The patient agreed with the plan and demonstrated an understanding of the instructions.   The patient was advised to call back or seek an in-person evaluation if the symptoms worsen or if the condition fails to improve as anticipated.    I spent 10 minutes on this telephone encounter.  Video connection was lost at >50% of the duration of the visit, at which time the remainder of the visit was completed via audio only.  Thank  you for allowing me to participate in the care of this very pleasant patient.   Nelwyn Salisbury PA-C

## 2022-05-24 ENCOUNTER — Ambulatory Visit: Payer: BC Managed Care – PPO

## 2022-05-24 ENCOUNTER — Ambulatory Visit: Payer: Medicare Other

## 2022-05-25 ENCOUNTER — Ambulatory Visit: Payer: BC Managed Care – PPO

## 2022-05-26 ENCOUNTER — Ambulatory Visit: Payer: Medicare Other

## 2022-05-27 ENCOUNTER — Ambulatory Visit: Payer: Medicare Other | Admitting: Family Medicine

## 2022-05-27 ENCOUNTER — Other Ambulatory Visit: Payer: Self-pay

## 2022-05-27 ENCOUNTER — Emergency Department: Payer: Medicare Other

## 2022-05-27 ENCOUNTER — Ambulatory Visit: Payer: Medicare Other

## 2022-05-27 ENCOUNTER — Inpatient Hospital Stay
Admission: EM | Admit: 2022-05-27 | Discharge: 2022-05-31 | DRG: 175 | Disposition: A | Discharge status: Home Health | Payer: Medicare Other | Attending: Internal Medicine | Admitting: Internal Medicine

## 2022-05-27 DIAGNOSIS — N281 Cyst of kidney, acquired: Secondary | ICD-10-CM | POA: Diagnosis not present

## 2022-05-27 DIAGNOSIS — I110 Hypertensive heart disease with heart failure: Secondary | ICD-10-CM | POA: Diagnosis present

## 2022-05-27 DIAGNOSIS — I5032 Chronic diastolic (congestive) heart failure: Secondary | ICD-10-CM | POA: Diagnosis not present

## 2022-05-27 DIAGNOSIS — Z7901 Long term (current) use of anticoagulants: Secondary | ICD-10-CM | POA: Diagnosis not present

## 2022-05-27 DIAGNOSIS — I6523 Occlusion and stenosis of bilateral carotid arteries: Secondary | ICD-10-CM | POA: Diagnosis not present

## 2022-05-27 DIAGNOSIS — R0602 Shortness of breath: Secondary | ICD-10-CM | POA: Diagnosis not present

## 2022-05-27 DIAGNOSIS — D638 Anemia in other chronic diseases classified elsewhere: Secondary | ICD-10-CM | POA: Diagnosis not present

## 2022-05-27 DIAGNOSIS — I2699 Other pulmonary embolism without acute cor pulmonale: Secondary | ICD-10-CM

## 2022-05-27 DIAGNOSIS — I82452 Acute embolism and thrombosis of left peroneal vein: Secondary | ICD-10-CM | POA: Diagnosis not present

## 2022-05-27 DIAGNOSIS — E871 Hypo-osmolality and hyponatremia: Secondary | ICD-10-CM | POA: Diagnosis not present

## 2022-05-27 DIAGNOSIS — Z85828 Personal history of other malignant neoplasm of skin: Secondary | ICD-10-CM | POA: Diagnosis not present

## 2022-05-27 DIAGNOSIS — I82409 Acute embolism and thrombosis of unspecified deep veins of unspecified lower extremity: Secondary | ICD-10-CM

## 2022-05-27 DIAGNOSIS — Z85038 Personal history of other malignant neoplasm of large intestine: Secondary | ICD-10-CM | POA: Diagnosis not present

## 2022-05-27 DIAGNOSIS — J4521 Mild intermittent asthma with (acute) exacerbation: Secondary | ICD-10-CM | POA: Diagnosis not present

## 2022-05-27 DIAGNOSIS — Z882 Allergy status to sulfonamides status: Secondary | ICD-10-CM

## 2022-05-27 DIAGNOSIS — J189 Pneumonia, unspecified organism: Secondary | ICD-10-CM | POA: Diagnosis not present

## 2022-05-27 DIAGNOSIS — J9601 Acute respiratory failure with hypoxia: Secondary | ICD-10-CM | POA: Diagnosis not present

## 2022-05-27 DIAGNOSIS — R062 Wheezing: Secondary | ICD-10-CM | POA: Diagnosis not present

## 2022-05-27 DIAGNOSIS — J45909 Unspecified asthma, uncomplicated: Secondary | ICD-10-CM | POA: Diagnosis not present

## 2022-05-27 DIAGNOSIS — Z9049 Acquired absence of other specified parts of digestive tract: Secondary | ICD-10-CM

## 2022-05-27 DIAGNOSIS — K219 Gastro-esophageal reflux disease without esophagitis: Secondary | ICD-10-CM | POA: Diagnosis present

## 2022-05-27 DIAGNOSIS — J45901 Unspecified asthma with (acute) exacerbation: Secondary | ICD-10-CM | POA: Diagnosis not present

## 2022-05-27 DIAGNOSIS — Z823 Family history of stroke: Secondary | ICD-10-CM

## 2022-05-27 DIAGNOSIS — Z7951 Long term (current) use of inhaled steroids: Secondary | ICD-10-CM

## 2022-05-27 DIAGNOSIS — Z853 Personal history of malignant neoplasm of breast: Secondary | ICD-10-CM

## 2022-05-27 DIAGNOSIS — K8020 Calculus of gallbladder without cholecystitis without obstruction: Secondary | ICD-10-CM | POA: Diagnosis not present

## 2022-05-27 DIAGNOSIS — I82432 Acute embolism and thrombosis of left popliteal vein: Secondary | ICD-10-CM | POA: Diagnosis not present

## 2022-05-27 DIAGNOSIS — Z800 Family history of malignant neoplasm of digestive organs: Secondary | ICD-10-CM

## 2022-05-27 DIAGNOSIS — C50919 Malignant neoplasm of unspecified site of unspecified female breast: Secondary | ICD-10-CM | POA: Diagnosis present

## 2022-05-27 DIAGNOSIS — Z87891 Personal history of nicotine dependence: Secondary | ICD-10-CM | POA: Diagnosis not present

## 2022-05-27 DIAGNOSIS — I82442 Acute embolism and thrombosis of left tibial vein: Secondary | ICD-10-CM | POA: Diagnosis present

## 2022-05-27 DIAGNOSIS — D72829 Elevated white blood cell count, unspecified: Secondary | ICD-10-CM | POA: Diagnosis present

## 2022-05-27 DIAGNOSIS — Z79899 Other long term (current) drug therapy: Secondary | ICD-10-CM

## 2022-05-27 DIAGNOSIS — Z808 Family history of malignant neoplasm of other organs or systems: Secondary | ICD-10-CM

## 2022-05-27 DIAGNOSIS — M545 Low back pain, unspecified: Secondary | ICD-10-CM | POA: Diagnosis present

## 2022-05-27 DIAGNOSIS — Z8616 Personal history of COVID-19: Secondary | ICD-10-CM | POA: Diagnosis not present

## 2022-05-27 DIAGNOSIS — R509 Fever, unspecified: Secondary | ICD-10-CM | POA: Diagnosis present

## 2022-05-27 DIAGNOSIS — Z8701 Personal history of pneumonia (recurrent): Secondary | ICD-10-CM

## 2022-05-27 DIAGNOSIS — E785 Hyperlipidemia, unspecified: Secondary | ICD-10-CM | POA: Diagnosis not present

## 2022-05-27 DIAGNOSIS — D649 Anemia, unspecified: Secondary | ICD-10-CM | POA: Diagnosis not present

## 2022-05-27 DIAGNOSIS — Z833 Family history of diabetes mellitus: Secondary | ICD-10-CM

## 2022-05-27 DIAGNOSIS — I2693 Single subsegmental pulmonary embolism without acute cor pulmonale: Secondary | ICD-10-CM | POA: Diagnosis not present

## 2022-05-27 DIAGNOSIS — Z9221 Personal history of antineoplastic chemotherapy: Secondary | ICD-10-CM

## 2022-05-27 DIAGNOSIS — I10 Essential (primary) hypertension: Secondary | ICD-10-CM | POA: Diagnosis not present

## 2022-05-27 DIAGNOSIS — D696 Thrombocytopenia, unspecified: Secondary | ICD-10-CM | POA: Diagnosis not present

## 2022-05-27 DIAGNOSIS — I2694 Multiple subsegmental pulmonary emboli without acute cor pulmonale: Secondary | ICD-10-CM | POA: Diagnosis not present

## 2022-05-27 DIAGNOSIS — E876 Hypokalemia: Secondary | ICD-10-CM | POA: Diagnosis not present

## 2022-05-27 DIAGNOSIS — I672 Cerebral atherosclerosis: Secondary | ICD-10-CM | POA: Diagnosis not present

## 2022-05-27 DIAGNOSIS — I82402 Acute embolism and thrombosis of unspecified deep veins of left lower extremity: Secondary | ICD-10-CM | POA: Diagnosis not present

## 2022-05-27 HISTORY — DX: Other pulmonary embolism without acute cor pulmonale: I26.99

## 2022-05-27 LAB — BASIC METABOLIC PANEL
Anion gap: 10 (ref 5–15)
BUN: 13 mg/dL (ref 8–23)
CO2: 27 mmol/L (ref 22–32)
Calcium: 10.2 mg/dL (ref 8.9–10.3)
Chloride: 94 mmol/L — ABNORMAL LOW (ref 98–111)
Creatinine, Ser: 0.89 mg/dL (ref 0.44–1.00)
GFR, Estimated: >60 mL/min (ref >60)
Glucose, Bld: 141 mg/dL — ABNORMAL HIGH (ref 70–99)
Potassium: 3.8 mmol/L (ref 3.5–5.1)
Sodium: 131 mmol/L — ABNORMAL LOW (ref 135–145)

## 2022-05-27 LAB — PROTIME-INR
INR: 1.1 (ref 0.8–1.2)
Prothrombin Time: 14.5 seconds (ref 11.4–15.2)

## 2022-05-27 LAB — TROPONIN I (HIGH SENSITIVITY): Troponin I (High Sensitivity): 8 ng/L (ref <18)

## 2022-05-27 LAB — URINALYSIS, ROUTINE W REFLEX MICROSCOPIC
Bilirubin Urine: NEGATIVE
Glucose, UA: NEGATIVE mg/dL
Hgb urine dipstick: NEGATIVE
Ketones, ur: 5 mg/dL — AB
Leukocytes,Ua: NEGATIVE
Nitrite: NEGATIVE
Protein, ur: NEGATIVE mg/dL
Specific Gravity, Urine: >1.046 — ABNORMAL HIGH (ref 1.005–1.030)
pH: 6 (ref 5.0–8.0)

## 2022-05-27 LAB — CBC
HCT: 30.5 % — ABNORMAL LOW (ref 36.0–46.0)
Hemoglobin: 10.1 g/dL — ABNORMAL LOW (ref 12.0–15.0)
MCH: 33.2 pg (ref 26.0–34.0)
MCHC: 33.1 g/dL (ref 30.0–36.0)
MCV: 100.3 fL — ABNORMAL HIGH (ref 80.0–100.0)
Platelets: 244 10*3/uL (ref 150–400)
RBC: 3.04 MIL/uL — ABNORMAL LOW (ref 3.87–5.11)
RDW: 15.9 % — ABNORMAL HIGH (ref 11.5–15.5)
WBC: 15.9 10*3/uL — ABNORMAL HIGH (ref 4.0–10.5)
nRBC: 0 % (ref 0.0–0.2)

## 2022-05-27 LAB — FOLATE: Folate: 5.9 ng/mL — ABNORMAL LOW (ref >5.9)

## 2022-05-27 LAB — BRAIN NATRIURETIC PEPTIDE: B Natriuretic Peptide: 70 pg/mL (ref 0.0–100.0)

## 2022-05-27 LAB — APTT: aPTT: 30 seconds (ref 24–36)

## 2022-05-27 MED ORDER — LIDOCAINE 5 % EX PTCH
1.0000 | MEDICATED_PATCH | CUTANEOUS | Status: DC
  Administered 2022-05-27: 1 via TRANSDERMAL
  Filled 2022-05-27 (×5): qty 1

## 2022-05-27 MED ORDER — ATORVASTATIN CALCIUM 10 MG PO TABS
10.0000 mg | ORAL_TABLET | Freq: Every day | ORAL | Status: DC
  Administered 2022-05-28 – 2022-05-31 (×4): 10 mg via ORAL
  Filled 2022-05-27 (×5): qty 1

## 2022-05-27 MED ORDER — HEPARIN (PORCINE) 25000 UT/250ML-% IV SOLN
1200.0000 [IU]/h | INTRAVENOUS | Status: DC
  Administered 2022-05-27: 1200 [IU]/h via INTRAVENOUS
  Filled 2022-05-27: qty 250

## 2022-05-27 MED ORDER — SODIUM CHLORIDE 0.9% FLUSH
3.0000 mL | Freq: Two times a day (BID) | INTRAVENOUS | Status: DC
  Administered 2022-05-27 – 2022-05-31 (×6): 3 mL via INTRAVENOUS

## 2022-05-27 MED ORDER — GUAIFENESIN ER 600 MG PO TB12
600.0000 mg | ORAL_TABLET | Freq: Two times a day (BID) | ORAL | Status: DC | PRN
  Administered 2022-05-30: 600 mg via ORAL
  Filled 2022-05-27: qty 1

## 2022-05-27 MED ORDER — IOHEXOL 350 MG/ML SOLN
100.0000 mL | Freq: Once | INTRAVENOUS | Status: AC | PRN
  Administered 2022-05-27: 100 mL via INTRAVENOUS

## 2022-05-27 MED ORDER — UMECLIDINIUM BROMIDE 62.5 MCG/ACT IN AEPB
1.0000 | INHALATION_SPRAY | Freq: Every day | RESPIRATORY_TRACT | Status: DC
  Administered 2022-05-28 – 2022-05-31 (×4): 1 via RESPIRATORY_TRACT
  Filled 2022-05-27: qty 7

## 2022-05-27 MED ORDER — PANTOPRAZOLE SODIUM 40 MG IV SOLR
40.0000 mg | Freq: Two times a day (BID) | INTRAVENOUS | Status: DC
  Administered 2022-05-27 – 2022-05-29 (×4): 40 mg via INTRAVENOUS
  Filled 2022-05-27 (×4): qty 10

## 2022-05-27 MED ORDER — MORPHINE SULFATE (PF) 2 MG/ML IV SOLN
2.0000 mg | INTRAVENOUS | Status: DC | PRN
  Administered 2022-05-27 (×2): 2 mg via INTRAVENOUS
  Filled 2022-05-27 (×2): qty 1

## 2022-05-27 MED ORDER — HEPARIN BOLUS VIA INFUSION
5000.0000 [IU] | Freq: Once | INTRAVENOUS | Status: AC
  Administered 2022-05-27: 5000 [IU] via INTRAVENOUS
  Filled 2022-05-27: qty 5000

## 2022-05-27 MED ORDER — HYDROCODONE-ACETAMINOPHEN 5-325 MG PO TABS
1.0000 | ORAL_TABLET | ORAL | Status: DC | PRN
  Administered 2022-05-28: 1 via ORAL
  Filled 2022-05-27: qty 1

## 2022-05-27 MED ORDER — MORPHINE SULFATE (PF) 4 MG/ML IV SOLN
4.0000 mg | INTRAVENOUS | Status: DC | PRN
  Administered 2022-05-28 (×3): 4 mg via INTRAVENOUS
  Filled 2022-05-27 (×3): qty 1

## 2022-05-27 MED ORDER — MORPHINE SULFATE (PF) 2 MG/ML IV SOLN: 2.0000 mg | INTRAVENOUS | Status: DC | PRN

## 2022-05-27 MED ORDER — HYDRALAZINE HCL 20 MG/ML IJ SOLN: 5.0000 mg | INTRAMUSCULAR | Status: DC | PRN

## 2022-05-27 MED ORDER — ALBUTEROL SULFATE (2.5 MG/3ML) 0.083% IN NEBU: 3.0000 mL | INHALATION_SOLUTION | Freq: Four times a day (QID) | RESPIRATORY_TRACT | Status: DC | PRN

## 2022-05-27 MED ORDER — ACETAMINOPHEN 650 MG RE SUPP: 650.0000 mg | Freq: Four times a day (QID) | RECTAL | Status: DC | PRN

## 2022-05-27 MED ORDER — ONDANSETRON HCL 4 MG PO TABS: 4.0000 mg | ORAL_TABLET | Freq: Four times a day (QID) | ORAL | Status: DC | PRN

## 2022-05-27 MED ORDER — ACETAMINOPHEN 325 MG PO TABS: 650.0000 mg | ORAL_TABLET | Freq: Four times a day (QID) | ORAL | Status: DC | PRN

## 2022-05-27 MED ORDER — SODIUM CHLORIDE 0.9 % IV SOLN: Freq: Once | INTRAVENOUS | Status: AC

## 2022-05-27 MED ORDER — HYDROCHLOROTHIAZIDE 12.5 MG PO TABS
12.5000 mg | ORAL_TABLET | Freq: Every day | ORAL | Status: DC
  Administered 2022-05-28 – 2022-05-29 (×2): 12.5 mg via ORAL
  Filled 2022-05-27 (×2): qty 1

## 2022-05-27 MED ORDER — LACTATED RINGERS IV SOLN: INTRAVENOUS | Status: AC

## 2022-05-27 MED ORDER — ONDANSETRON HCL 4 MG/2ML IJ SOLN: 4.0000 mg | Freq: Four times a day (QID) | INTRAMUSCULAR | Status: DC | PRN

## 2022-05-27 NOTE — Assessment & Plan Note (Deleted)
IV ppi.   

## 2022-05-27 NOTE — Assessment & Plan Note (Signed)
Vitals:   05/27/22 1856  BP: 107/87  Hold hctz and hydralazine.

## 2022-05-27 NOTE — Assessment & Plan Note (Addendum)
Continue Eliquis.  Right lower lung pain likely secondary to possible pulmonary infarct.  Risk of bleeding explained to patient and husband secondary to Eliquis.

## 2022-05-27 NOTE — Assessment & Plan Note (Signed)
Check ferritin level for further classification of anemia.

## 2022-05-27 NOTE — Assessment & Plan Note (Signed)
Pulse ox with room air on ambulation.  I do not see any low pulse ox is documented in the computer.

## 2022-05-27 NOTE — ED Provider Notes (Signed)
[facility] Provider Note    Event Date/Time   First MD Initiated Contact with Patient 05/27/22 1910     (approximate)   History   Back Pain   HPI  Sabrina Wilson is a 82 y.o. female with history of remote colon cancer as well as breast cancer just completing her last round of chemotherapy with recent admission to the hospital for pneumonia and recent diagnosis of COVID presents to the ER for worsening right low back pain.  States that her breathing is felt improved.  They had noted some fevers today.  The pain is worse with deep inspiration.  Denies any abdominal pain no nausea or vomiting.  No numbness or tingling.     Physical Exam   Triage Vital Signs: ED Triage Vitals  Enc Vitals Group     BP 05/27/22 1856 107/87     Pulse Rate 05/27/22 1856 (!) 110     Resp 05/27/22 1856 20     Temp 05/27/22 1856 98.9 F (37.2 C)     Temp Source 05/27/22 1856 Oral     SpO2 05/27/22 1856 98 %     Weight --      Height --      Head Circumference --      Peak Flow --      Pain Score 05/27/22 1857 7     Pain Loc --      Pain Edu? --      Excl. in [place]? --     Most recent vital signs: Vitals:   05/27/22 1856  BP: 107/87  Pulse: (!) 110  Resp: 20  Temp: 98.9 F (37.2 C)  SpO2: 98%     Constitutional: Alert  Eyes: Conjunctivae are normal.  Head: Atraumatic. Nose: No congestion/rhinnorhea. Mouth/Throat: Mucous membranes are moist.   Neck: Painless ROM.  Cardiovascular:   Good peripheral circulation. Respiratory: mild tachypnea, respiratory effort.  No retractions.  Gastrointestinal: Soft and nontender.  Musculoskeletal:  no deformity Neurologic:  MAE spontaneously. No gross focal neurologic deficits are appreciated.  Skin:  Skin is warm, dry and intact. No rash noted. Psychiatric: Mood and affect are normal. Speech and behavior are normal.    ED Results / Procedures / Treatments   Labs (all labs ordered are listed, but only abnormal  results are displayed) Labs Reviewed  CBC - Abnormal; Notable for the following components:      Result Value   WBC 15.9 (*)    RBC 3.04 (*)    Hemoglobin 10.1 (*)    HCT 30.5 (*)    MCV 100.3 (*)    RDW 15.9 (*)    All other components within normal limits  BASIC METABOLIC PANEL - Abnormal; Notable for the following components:   Sodium 131 (*)    Chloride 94 (*)    Glucose, Bld 141 (*)    All other components within normal limits  URINALYSIS, ROUTINE W REFLEX MICROSCOPIC  BRAIN NATRIURETIC PEPTIDE  TROPONIN I (HIGH SENSITIVITY)     EKG     RADIOLOGY Please see ED Course for my review and interpretation.  I personally reviewed all radiographic images ordered to evaluate for the above acute complaints and reviewed radiology reports and findings.  These findings were personally discussed with the patient.  Please see medical record for radiology report.    PROCEDURES:  Critical Care performed: Yes, see critical care procedure note(s)  .Critical Care  Performed by: Merlyn Lot, MD Authorized by: Quentin Cornwall,  Saralyn Pilar, MD   Critical care provider statement:    Critical care time (minutes):  35   Critical care was necessary to treat or prevent imminent or life-threatening deterioration of the following conditions:  Respiratory failure   Critical care was time spent personally by me on the following activities:  Ordering and performing treatments and interventions, ordering and review of laboratory studies, ordering and review of radiographic studies, pulse oximetry, re-evaluation of patient's condition, review of old charts, obtaining history from patient or surrogate, examination of patient, evaluation of patient's response to treatment, discussions with primary provider, discussions with consultants and development of treatment plan with patient or surrogate    MEDICATIONS ORDERED IN ED: Medications  morphine (PF) 2 MG/ML injection 2 mg (2 mg Intravenous Given  05/27/22 2108)  lidocaine (LIDODERM) 5 % 1 patch (1 patch Transdermal Patch Applied 05/27/22 1951)  morphine (PF) 4 MG/ML injection 4 mg (has no administration in time range)  0.9 %  sodium chloride infusion (has no administration in time range)  iohexol (OMNIPAQUE) 350 MG/ML injection 100 mL (100 mLs Intravenous Contrast Given 05/27/22 2000)     IMPRESSION / MDM / [place] / ED COURSE  I reviewed the triage vital signs and the nursing notes.                              Differential diagnosis includes, but is not limited to, msk strain, lumbago, stone, pyelo, pe, dissection, pna, bronchitis, costochondritis  Patient presenting to the ER for evaluation of symptoms as described above.  Based on symptoms, risk factors and considered above differential, this presenting complaint could reflect a potentially life-threatening illness therefore the patient will be placed on continuous pulse oximetry and telemetry for monitoring.  Laboratory evaluation will be sent to evaluate for the above complaints.  Will order IV morphine for pain.  Her pain is very reproducible in the lower back but she is mildly tachypneic tachycardic with recent COVID illness and she is being treated for breast cancer therefore concern for possible PE.  Will order CT chest abdomen pelvis to further evaluate for the blood differential.    Clinical Course as of 05/27/22 2118  Fri May 27, 2022  2019 CT imaging my review and interpretation does show evidence of right lower lobe pulmonary embolus. [PR]  2100 CT head without acute abnormality.  Patient receiving IV morphine for pain.  Placed on supplemental oxygen.  Have ordered IV heparin for acute PE.  Will consult hospitalist for admission. [PR]    Clinical Course User Index [PR] Merlyn Lot, MD     FINAL CLINICAL IMPRESSION(S) / ED DIAGNOSES   Final diagnoses:  Single subsegmental pulmonary embolism without acute cor pulmonale ([place])     Rx / DC Orders    ED Discharge Orders     None        Note:  This document was prepared using Dragon voice recognition software and may include unintentional dictation errors.    Merlyn Lot, MD 05/27/22 2118

## 2022-05-27 NOTE — Consult Note (Addendum)
ANTICOAGULATION CONSULT NOTE - Initial Consult  Pharmacy Consult for Heparin infusion Indication: pulmonary embolus  Allergies  Allergen Reactions   Sulfa Antibiotics Hives    As a child    Patient Measurements: Height: '5\' 6"'$  (167.6 cm) Weight: 81.5 kg (179 lb 10.8 oz) IBW/kg (Calculated) : 59.3 Heparin Dosing Weight: 76.3kg  Vital Signs: Temp: 98.9 F (37.2 C) (02/02 1856) Temp Source: Oral (02/02 1856) BP: 107/87 (02/02 1856) Pulse Rate: 110 (02/02 1856)  Labs: Recent Labs    05/27/22 1852  HGB 10.1*  HCT 30.5*  PLT 244  CREATININE 0.89    Estimated Creatinine Clearance: 55.2 mL/min (by C-G formula based on SCr of 0.89 mg/dL).  Medical History: Past Medical History:  Diagnosis Date   Actinic keratosis 02/12/2020   R lat calf (hypertrophic)   Arthritis    Cancer of sigmoid ([place]) 06/17/2016   Colon cancer ([place]) 2009   T3,N0; s/p resection  Dr. Hulda Humphrey and chemotherapy by Dr. Cynda Acres   Diastolic dysfunction    a. 08/2020 Echo: EF 60-65%, no rwma, Gr1 DD, nl RV size/fxn. Mild MR.   Hernia of flank    History of actinic keratoses 08/11/2020   Bx proven at left lateral ankle proximal, LN2 10/08/20   History of stress test    a. 09/2020 MV: EF >65%. No ischemia/infarct. Mild Ao Ca2+ and minimal Cor Ca2+.   Hypertension    Neuropathy    Polyp at cervical os    s/p resection Dr. Sabra Heck   Skin cancer 01/22/2020   surface of an atypical verrucous squamous proliferation    Squamous cell carcinoma of skin 07/16/2020   Left lateral ankle anterior, treated with [facility] 08-11-2020    Medications:  No AC prior to admission.   *Hgb and PLT appropriate to start infusion* Baseline INR and aPTT ordered  Assessment: PMH includes  hx of colon cancer and breast cancer, recent chemotherapy, COVID pneumonia admission 1/18. Pharmacy consulted to manage heparin infusion for new dx PE.  Goal of Therapy:  Heparin level 0.3-0.7 units/ml Monitor platelets by anticoagulation protocol:  Yes   Plan:  Give 5000 units bolus x 1 Start heparin infusion at 1200 units/hr Check anti-Xa level in 8 hours and daily while on heparin Continue to monitor H&H and platelets  Lindsi Bayliss Rodriguez-Guzman PharmD, BCPS 05/27/2022 9:40 PM

## 2022-05-27 NOTE — ED Notes (Signed)
Report given to Travis RN

## 2022-05-27 NOTE — H&P (Signed)
History and Physical    Chief Complaint: Rt lower back pain   HISTORY OF PRESENT ILLNESS: Sabrina Wilson is an 82 y.o. female  coming for right lower back pain. Pain is worse with deep inspiration and noted fevers today.  Chart review shows that patient was admitted on May 12, 2022 and discharged on May 17, 2022 for acute hypoxic respiratory failure demand ischemia bilateral pneumonia hypotension and severe sepsis. Pt is also finishing her last dose of paxlovid  Patient is a history of smoking and has a history of breast cancer and colon cancer and receives chemotherapy.  Patient also has history of chronic diastolic congestive heart failure.  CT angio done was negative for pulmonary embolism on 18 January.    Blood work today shows hyponatremia with a sodium of 131 and glucose 141 normal kidney function.Respiratory panel is negative for flu and RSV and COVID. CBC shows leukocytosis of 15.9 hemoglobin of 10.1 and patient has been intermittently anemic since November 2023.  Chart review shows patient's last colonoscopy was in 2019 July which showed:  Patient had echocardiogram in May 2022 which showed normal systolic function and impaired relaxation per cardiology note.    Pt has  Past Medical History:  Diagnosis Date   Actinic keratosis 02/12/2020   R lat calf (hypertrophic)   Arthritis    Cancer of sigmoid ([place]) 06/17/2016   Colon cancer ([place]) 2009   T3,N0; s/p resection  Dr. Hulda Humphrey and chemotherapy by Dr. Cynda Acres   Diastolic dysfunction    a. 08/2020 Echo: EF 60-65%, no rwma, Gr1 DD, nl RV size/fxn. Mild MR.   Hernia of flank    History of actinic keratoses 08/11/2020   Bx proven at left lateral ankle proximal, LN2 10/08/20   History of stress test    a. 09/2020 MV: EF >65%. No ischemia/infarct. Mild Ao Ca2+ and minimal Cor Ca2+.   Hypertension    Neuropathy    Polyp at cervical os    s/p resection Dr. Sabra Heck   Skin cancer 01/22/2020   surface of an atypical  verrucous squamous proliferation    Squamous cell carcinoma of skin 07/16/2020   Left lateral ankle anterior, treated with [place] 08-11-2020     Review of Systems  Musculoskeletal:  Positive for back pain.  All other systems reviewed and are negative.  Allergies  Allergen Reactions   Sulfa Antibiotics Hives    As a child   Past Surgical History:  Procedure Laterality Date   BREAST BIOPSY Right 05/2014   Dr. Dwyane Luo office-benign   BREAST LUMPECTOMY WITH SENTINEL LYMPH NODE BIOPSY Right 01/21/2022   Procedure: BREAST LUMPECTOMY WITH SENTINEL LYMPH NODE BX;  Surgeon: Robert Bellow, MD;  Location: [facility] ORS;  Service: General;  Laterality: Right;   BREAST SURGERY Right February 2016   Vacuum assisted biopsy for recurrent cyst, fibrocystic changes without evidence of malignancy.   CATARACT EXTRACTION W/PHACO Left 01/13/2015   Procedure: CATARACT EXTRACTION PHACO AND INTRAOCULAR LENS PLACEMENT (IOC);  Surgeon: Birder Robson, MD;  Location: [facility] ORS;  Service: Ophthalmology;  Laterality: Left;  [place]  1:02.9 AP   19.2 CDE  12.06 casette lot #  [id number]   CATARACT EXTRACTION W/PHACO Right 02/03/2015   Procedure: CATARACT EXTRACTION PHACO AND INTRAOCULAR LENS PLACEMENT (IOC);  Surgeon: Birder Robson, MD;  Location: [facility] ORS;  Service: Ophthalmology;  Laterality: Right;  us00:53 ap46.5 [id number]   COLECTOMY     COLONOSCOPY  2015   Dr Candace Cruise   COLONOSCOPY WITH PROPOFOL N/A  09/13/2017   Procedure: COLONOSCOPY WITH PROPOFOL;  Surgeon: Robert Bellow, MD;  Location: [facility] ENDOSCOPY;  Service: Endoscopy;  Laterality: N/A;   COLONOSCOPY WITH PROPOFOL N/A 11/08/2017   Procedure: COLONOSCOPY WITH PROPOFOL;  Surgeon: Robert Bellow, MD;  Location: [facility] ENDOSCOPY;  Service: Endoscopy;  Laterality: N/A;   colostomy reversal     DILATION AND CURETTAGE OF UTERUS  2014   Dr. Sabra Heck, benign per pt   EYE SURGERY     HERNIA REPAIR  07/13/12   ventral    SKIN CANCER EXCISION     TONSILLECTOMY          MEDICATIONS: Current Outpatient Medications  Medication Instructions   albuterol (VENTOLIN HFA) 108 (90 Base) MCG/ACT inhaler 2 puffs, Inhalation, Every 6 hours PRN   atorvastatin (LIPITOR) 10 mg, Oral, Daily   Calcium Carbonate-Vit D-Min (CALCIUM 1200 PO) 1 tablet, Oral, Daily   hydrochlorothiazide (HYDRODIURIL) 12.5 MG tablet TAKE ONE TABLET BY MOUTH DAILY   niacinamide 500 mg, Oral, 2 times daily   nirmatrelvir & ritonavir (PAXLOVID, 300/100,) 20 x 150 MG & 10 x '100MG'$  TBPK Oral   Omega-3 Fatty Acids (FISH OIL PO) 1 capsule, Oral, Daily   ondansetron (ZOFRAN) 8 MG tablet One pill every 8 hours as needed for nausea/vomitting.   prochlorperazine (COMPAZINE) 10 mg, Oral, Every 6 hours PRN   Ruxolitinib Phosphate (OPZELURA) 1.5 % CREA 1 application , Apply externally, Daily   umeclidinium bromide (INCRUSE ELLIPTA) 62.5 MCG/ACT AEPB 1 puff, Inhalation, Daily   VITAMIN D, CHOLECALCIFEROL, PO 2,000 Units, Oral, Daily     atorvastatin  10 mg Oral Daily   hydrochlorothiazide  12.5 mg Oral Daily   lidocaine  1 patch Transdermal Q24H   pantoprazole (PROTONIX) IV  40 mg Intravenous Q12H   sodium chloride flush  3 mL Intravenous Q12H   [START ON 05/28/2022] umeclidinium bromide  1 puff Inhalation Daily     sodium chloride     lactated ringers      acetaminophen **OR** acetaminophen, albuterol, guaiFENesin, hydrALAZINE, HYDROcodone-acetaminophen, morphine injection, morphine injection, morphine injection, ondansetron **OR** ondansetron (ZOFRAN) IV    ED Course: Pt in Ed  pt is alert/awake and oriented and on oxygen.  Vitals:   05/27/22 1856 05/27/22 2106  BP: 107/87   Pulse: (!) 110   Resp: 20   Temp: 98.9 F (37.2 C)   TempSrc: Oral   SpO2: 98%   Weight:  81.5 kg  Height:  '5\' 6"'$  (1.676 m)  No intake/output data recorded. SpO2: 98 % Blood work in ed shows: Leukocytosis of a white count of 15.9 hemoglobin of 10.1 MCV of 100.3. BMP shows sodium of 131 glucose 141 normal kidney  function. No extremity venous Doppler done is positive for : IMPRESSION: 1. Nonocclusive thrombus within the left popliteal, posterior tibial and peroneal veins. Results for orders placed or performed during the hospital encounter of 05/27/22 (from the past 24 hour(s))  CBC     Status: Abnormal   Collection Time: 05/27/22  6:52 PM  Result Value Ref Range   WBC 15.9 (H) 4.0 - 10.5 K/uL   RBC 3.04 (L) 3.87 - 5.11 MIL/uL   Hemoglobin 10.1 (L) 12.0 - 15.0 g/dL   HCT 30.5 (L) 36.0 - 46.0 %   MCV 100.3 (H) 80.0 - 100.0 fL   MCH 33.2 26.0 - 34.0 pg   MCHC 33.1 30.0 - 36.0 g/dL   RDW 15.9 (H) 11.5 - 15.5 %   Platelets 244 150 - 400  K/uL   nRBC 0.0 0.0 - 0.2 %  Basic metabolic panel     Status: Abnormal   Collection Time: 05/27/22  6:52 PM  Result Value Ref Range   Sodium 131 (L) 135 - 145 mmol/L   Potassium 3.8 3.5 - 5.1 mmol/L   Chloride 94 (L) 98 - 111 mmol/L   CO2 27 22 - 32 mmol/L   Glucose, Bld 141 (H) 70 - 99 mg/dL   BUN 13 8 - 23 mg/dL   Creatinine, Ser 0.89 0.44 - 1.00 mg/dL   Calcium 10.2 8.9 - 10.3 mg/dL   GFR, Estimated >60 >60 mL/min   Anion gap 10 5 - 15  Troponin I (High Sensitivity)     Status: None   Collection Time: 05/27/22  6:52 PM  Result Value Ref Range   Troponin I (High Sensitivity) 8 <18 ng/L  Brain natriuretic peptide     Status: None   Collection Time: 05/27/22  6:52 PM  Result Value Ref Range   B Natriuretic Peptide 70.0 0.0 - 100.0 pg/mL  Urinalysis, Routine w reflex microscopic -Urine, Clean Catch     Status: Abnormal   Collection Time: 05/27/22  9:14 PM  Result Value Ref Range   Color, Urine YELLOW (A) YELLOW   APPearance CLEAR (A) CLEAR   Specific Gravity, Urine >1.046 (H) 1.005 - 1.030   pH 6.0 5.0 - 8.0   Glucose, UA NEGATIVE NEGATIVE mg/dL   Hgb urine dipstick NEGATIVE NEGATIVE   Bilirubin Urine NEGATIVE NEGATIVE   Ketones, ur 5 (A) NEGATIVE mg/dL   Protein, ur NEGATIVE NEGATIVE mg/dL   Nitrite NEGATIVE NEGATIVE   Leukocytes,Ua NEGATIVE  NEGATIVE    Unresulted Labs (From admission, onward)     Start     Ordered   05/27/22 2128  APTT  ONCE - STAT,   R        05/27/22 2127   05/27/22 2128  Protime-INR  ONCE - STAT,   R        05/27/22 2127   Unscheduled  Occult blood card to lab, stool  As needed,   URGENT      05/27/22 2119           Pt has received : Orders Placed This Encounter  Procedures   Critical Care    This order was created via procedure documentation    Standing Status:   Standing    Number of Occurrences:   1   CT Angio Chest PE W and/or Wo Contrast    Standing Status:   Standing    Number of Occurrences:   1    Order Specific Question:   Bypass Screening Labs?    Answer:   I, as the ordering provider, have weighed the risks vs. benefits for this patient and concluded this exam meets the requirements for medical necessity to bypass lab work needed prior to CT.    Order Specific Question:   Does the patient have a contrast media/X-ray dye allergy?    Answer:   No    Order Specific Question:   If indicated for the ordered procedure, I authorize the administration of contrast media per Radiology protocol    Answer:   Yes    Order Specific Question:   Radiology Contrast Protocol - do NOT remove file path    Answer:   [email or web address]   CT ABDOMEN PELVIS W CONTRAST    Standing Status:   Standing    Number of Occurrences:  1    Order Specific Question:   Does the patient have a contrast media/X-ray dye allergy?    Answer:   No    Order Specific Question:   If indicated for the ordered procedure, I authorize the administration of contrast media per Radiology protocol    Answer:   Yes    Order Specific Question:   Is Oral Contrast requested for this exam?    Answer:   No oral contrast    Order Specific Question:   Reason for No Oral Contrast    Answer:   Medical necessity (Time Sensitive)   CT HEAD WO CONTRAST (5MM)    Standing Status:   Standing    Number  of Occurrences:   1   US Venous Img Lower Bilateral    Standing Status:   Standing    Number of Occurrences:   1    Order Specific Question:   Reason for Exam (SYMPTOM  OR DIAGNOSIS REQUIRED)    Answer:   pe   Urinalysis, Routine w reflex microscopic -Urine, Clean Catch    Standing Status:   Standing    Number of Occurrences:   1    Order Specific Question:   Specimen Source    Answer:   Urine, Clean Catch [76]   CBC    Standing Status:   Standing    Number of Occurrences:   1   Basic metabolic panel    Standing Status:   Standing    Number of Occurrences:   1   Brain natriuretic peptide    Standing Status:   Standing    Number of Occurrences:   1   Occult blood card to lab, stool    Standing Status:   Standing    Number of Occurrences:   3   APTT    Standing Status:   Standing    Number of Occurrences:   1   Protime-INR    Standing Status:   Standing    Number of Occurrences:   1   Diet Heart Room service appropriate? Yes; Fluid consistency: Thin    Standing Status:   Standing    Number of Occurrences:   1    Order Specific Question:   Room service appropriate?    Answer:   Yes    Order Specific Question:   Fluid consistency:    Answer:   Thin   Cardiac Monitoring Continuous x 24 hours Indications for use: Other; other indications for use: COPD exacerbation    Standing Status:   Standing    Number of Occurrences:   1    Order Specific Question:   Indications for use:    Answer:   Other    Order Specific Question:   other indications for use:    Answer:   COPD exacerbation   Maintain IV access    Standing Status:   Standing    Number of Occurrences:   1   Vital signs    Standing Status:   Standing    Number of Occurrences:   1   Notify physician (specify)    Standing Status:   Standing    Number of Occurrences:   20    Order Specific Question:   Notify Physician    Answer:   for pulse less than 55 or greater than 120    Order Specific Question:   Notify Physician     Answer:   for respiratory rate less than 12  or greater than 25    Order Specific Question:   Notify Physician    Answer:   for temperature greater than 100.5 F    Order Specific Question:   Notify Physician    Answer:   for urinary output less than 30 mL/hr for four hours    Order Specific Question:   Notify Physician    Answer:   for systolic BP less than 90 or greater than 706, diastolic BP less than 60 or greater than 100   Progressive Mobility Protocol: No Restrictions    Standing Status:   Standing    Number of Occurrences:   1   If patient diabetic or glucose greater than 140 notify physician for Sliding Scale Insulin Orders    Standing Status:   Standing    Number of Occurrences:   20   Intake and Output    Standing Status:   Standing    Number of Occurrences:   1   Initiate Oral Care Protocol    Standing Status:   Standing    Number of Occurrences:   1   Initiate Carrier Fluid Protocol    Standing Status:   Standing    Number of Occurrences:   1   Nurse to provide smoking / tobacco cessation education    Standing Status:   Standing    Number of Occurrences:   1   RN may order General Admission PRN Orders utilizing "General Admission PRN medications" (through manage orders) for the following patient needs: allergy symptoms (Claritin), cold sores (Carmex), cough (Robitussin DM), eye irritation (Liquifilm Tears), hemorrhoids (Tucks), indigestion (Maalox), minor skin irritation (Hydrocortisone Cream), muscle pain (Ben Gay), nose irritation (saline nasal spray) and sore throat (Chloraseptic spray).    Standing Status:   Standing    Number of Occurrences:   [phone]   Full code    Standing Status:   Standing    Number of Occurrences:   1    Order Specific Question:   By:    Answer:   Other   heparin per pharmacy consult    Standing Status:   Standing    Number of Occurrences:   1    Order Specific Question:   Indication:    Answer:   VTE Treatment   Consult to hospitalist     Standing Status:   Standing    Number of Occurrences:   1    Order Specific Question:   Place call to:    Answer:   [phone]    Order Specific Question:   Reason for Consult    Answer:   Admit   Nutritional services consult    Standing Status:   Standing    Number of Occurrences:   1    Order Specific Question:   Reason for Consult?    Answer:   nutritional goals   Consult to Transition of Care Team    Standing Status:   Standing    Number of Occurrences:   1    Order Specific Question:   Reason for Consult:    Answer:   Home Health / DME Needs    Order Specific Question:   Reason for Consult:    Answer:   Heart Failure / COPD Consult   Consult to respiratory care treatment    Standing Status:   Standing    Number of Occurrences:   1    Order Specific Question:   Reason for Consult?    Answer:  evaluation   OT eval and treat    Standing Status:   Standing    Number of Occurrences:   1   PT eval and treat    Standing Status:   Standing    Number of Occurrences:   1   Pulse oximetry check with vital signs    Standing Status:   Standing    Number of Occurrences:   1   Oxygen therapy Mode or (Route): Nasal cannula; Liters Per Minute: 2; Keep 02 saturation: greater than 92 %    Standing Status:   Standing    Number of Occurrences:   1    Order Specific Question:   Mode or (Route)    Answer:   Nasal cannula    Order Specific Question:   Liters Per Minute    Answer:   2    Order Specific Question:   Keep 02 saturation    Answer:   greater than 92 %   ED EKG    Standing Status:   Standing    Number of Occurrences:   1    Order Specific Question:   Reason for Exam    Answer:   Chest Pain   Admit to Inpatient (patient's expected length of stay will be greater than 2 midnights or inpatient only procedure)    Standing Status:   Standing    Number of Occurrences:   1    Order Specific Question:   Hospital Area    Answer:   [place] [[id number]]    Order  Specific Question:   Level of Care    Answer:   Telemetry Cardiac [103]    Order Specific Question:   Covid Evaluation    Answer:   Confirmed COVID Positive    Order Specific Question:   Diagnosis    Answer:   Pulmonary embolism ([place]) [161096]    Order Specific Question:   Admitting Physician    Answer:   Cherylann Ratel    Order Specific Question:   Attending Physician    Answer:   Cherylann Ratel    Order Specific Question:   Certification:    Answer:   I certify this patient will need inpatient services for at least 2 midnights    Order Specific Question:   Estimated Length of Stay    Answer:   2    Meds ordered this encounter  Medications   morphine (PF) 2 MG/ML injection 2 mg   lidocaine (LIDODERM) 5 % 1 patch   iohexol (OMNIPAQUE) 350 MG/ML injection 100 mL   morphine (PF) 4 MG/ML injection 4 mg   0.9 %  sodium chloride infusion   albuterol (PROVENTIL) (2.5 MG/3ML) 0.083% nebulizer solution 3 mL   atorvastatin (LIPITOR) tablet 10 mg   hydrochlorothiazide (HYDRODIURIL) tablet 12.5 mg   umeclidinium bromide (INCRUSE ELLIPTA) 62.5 MCG/ACT 1 puff   sodium chloride flush (NS) 0.9 % injection 3 mL   lactated ringers infusion   OR Linked Order Group    acetaminophen (TYLENOL) tablet 650 mg    acetaminophen (TYLENOL) suppository 650 mg   HYDROcodone-acetaminophen (NORCO/VICODIN) 5-325 MG per tablet 1-2 tablet   morphine (PF) 2 MG/ML injection 2 mg   OR Linked Order Group    ondansetron (ZOFRAN) tablet 4 mg    ondansetron (ZOFRAN) injection 4 mg   guaiFENesin (MUCINEX) 12 hr tablet 600 mg   hydrALAZINE (APRESOLINE) injection 5 mg   pantoprazole (PROTONIX) injection 40 mg  Admission Imaging : CT HEAD WO CONTRAST (5MM)  Result Date: 05/27/2022 CLINICAL DATA:  Metastatic disease evaluation EXAM: CT HEAD WITHOUT CONTRAST TECHNIQUE: Contiguous axial images were obtained from the base of the skull through the vertex without intravenous contrast. RADIATION DOSE  REDUCTION: This exam was performed according to the departmental dose-optimization program which includes automated exposure control, adjustment of the mA and/or kV according to patient size and/or use of iterative reconstruction technique. COMPARISON:  None Available. FINDINGS: Brain: There is no mass, hemorrhage or extra-axial collection. The size and configuration of the ventricles and extra-axial CSF spaces are normal. The brain parenchyma is normal, without acute or chronic infarction. No enhancing lesions. Vascular: Atherosclerotic calcification of the vertebral and internal carotid arteries at the skull base. No abnormal hyperdensity of the major intracranial arteries or dural venous sinuses. Skull: The visualized skull base, calvarium and extracranial soft tissues are normal. Sinuses/Orbits: No fluid levels or advanced mucosal thickening of the visualized paranasal sinuses. No mastoid or middle ear effusion. The orbits are normal. IMPRESSION: 1. No acute intracranial abnormality. No enhancing lesions. 2. Atherosclerotic calcification of the vertebral and internal carotid arteries at the skull base. Electronically Signed   By: Ulyses Jarred M.D.   On: 05/27/2022 20:51   CT ABDOMEN PELVIS W CONTRAST  Result Date: 05/27/2022 CLINICAL DATA:  Right lower back pain since yesterday EXAM: CT ABDOMEN AND PELVIS WITH CONTRAST TECHNIQUE: Multidetector CT imaging of the abdomen and pelvis was performed using the standard protocol following bolus administration of intravenous contrast. RADIATION DOSE REDUCTION: This exam was performed according to the departmental dose-optimization program which includes automated exposure control, adjustment of the mA and/or kV according to patient size and/or use of iterative reconstruction technique. CONTRAST:  130m OMNIPAQUE IOHEXOL 350 MG/ML SOLN COMPARISON:  05/12/2022 06/12/2012 FINDINGS: Lower chest: Please see separately reported examination of the chest. Hepatobiliary: No  solid liver abnormality is seen. Gallstones and sludge. No gallbladder wall thickening, or biliary dilatation. Pancreas: Unremarkable. No pancreatic ductal dilatation or surrounding inflammatory changes. Spleen: Normal in size without significant abnormality. Adrenals/Urinary Tract: Adrenal glands are unremarkable. Simple, benign renal cortical cysts, for which no further follow-up or characterization is required. Kidneys are otherwise normal, without renal calculi, solid lesion, or hydronephrosis. Bladder is unremarkable. Stomach/Bowel: Stomach is within normal limits. Appendix appears normal. No evidence of bowel wall thickening, distention, or inflammatory changes. Large burden of stool in the proximal colon. Status post sigmoid colon resection and reanastomosis. Vascular/Lymphatic: Aortic atherosclerosis. No enlarged abdominal or pelvic lymph nodes. Reproductive: Calcified uterine fibroids. Right ovarian cyst, again unchanged compared to remote prior examination dated 2014 and definitively benign. No follow-up imaging recommended. Note: This recommendation does not apply to premenarchal patients and to those with increased risk (genetic, family history, elevated tumor markers or other high-risk factors) of ovarian cancer. Reference: JACR 2020 Feb; 17(2):248-254 Other: No abdominal wall hernia or abnormality. No ascites. Musculoskeletal: No acute or significant osseous findings. IMPRESSION: 1. No acute CT findings of the abdomen or pelvis to explain right lower quadrant pain. 2. Cholelithiasis without evidence of acute cholecystitis. 3. Large burden of stool in the proximal colon. Aortic Atherosclerosis (ICD10-I70.0). Electronically Signed   By: ADelanna AhmadiM.D.   On: 05/27/2022 20:26   CT Angio Chest PE W and/or Wo Contrast  Result Date: 05/27/2022 CLINICAL DATA:  PE suspected EXAM: CT ANGIOGRAPHY CHEST WITH CONTRAST TECHNIQUE: Multidetector CT imaging of the chest was performed using the standard protocol  during bolus administration of intravenous contrast. Multiplanar  CT image reconstructions and MIPs were obtained to evaluate the vascular anatomy. RADIATION DOSE REDUCTION: This exam was performed according to the departmental dose-optimization program which includes automated exposure control, adjustment of the mA and/or kV according to patient size and/or use of iterative reconstruction technique. CONTRAST:  137m OMNIPAQUE IOHEXOL 350 MG/ML SOLN COMPARISON:  05/12/2022 FINDINGS: Cardiovascular: Satisfactory opacification of the pulmonary arteries to the segmental level. Positive examination for pulmonary embolism, with new segmental to subsegmental embolus present in the right lower lobe (series 6, image 261). Normal heart size. RV LV ratio 0.9. Left and right coronary artery calcifications. No pericardial effusion. Aortic atherosclerosis. Mediastinum/Nodes: No enlarged mediastinal, hilar, or axillary lymph nodes. Thyroid gland, trachea, and esophagus demonstrate no significant findings. Lungs/Pleura: Diffuse bilateral bronchial wall thickening. Background of fine centrilobular pulmonary nodules, most concentrated in the lung apices. Rounded subpleural ground-glass opacity of the dependent right lower lobe, distal to embolus (series 5, image 119). No pleural effusion or pneumothorax. Upper Abdomen: Please see separately reported examination of the abdomen and pelvis. Musculoskeletal: No chest wall abnormality. No acute osseous findings. Review of the MIP images confirms the above findings. IMPRESSION: 1. Positive examination for pulmonary embolism, with new segmental to subsegmental embolus present in the right lower lobe. 2. Rounded subpleural ground-glass opacity of the dependent right lower lobe, distal to embolus, most consistent with developing pulmonary infarct. 3. Diffuse bilateral bronchial wall thickening. 4. Background of fine centrilobular pulmonary nodules, most concentrated in the lung apices,  consistent with smoking-related respiratory bronchiolitis. 5. Coronary artery disease. These results were called by telephone at the time of interpretation on 05/27/2022 at 8:16 pm to Dr. PMerlyn Lot, who verbally acknowledged these results. Aortic Atherosclerosis (ICD10-I70.0). Electronically Signed   By: ADelanna AhmadiM.D.   On: 05/27/2022 20:20   Physical Examination: Vitals:   05/27/22 1856 05/27/22 2106  BP: 107/87   Pulse: (!) 110   Temp: 98.9 F (37.2 C)   Resp: 20   Height:  '5\' 6"'$  (1.676 m)  Weight:  81.5 kg  SpO2: 98%   TempSrc: Oral   BMI (Calculated):  29.01   Physical Exam Vitals and nursing note reviewed.  Constitutional:      General: She is not in acute distress.    Appearance: She is not ill-appearing, toxic-appearing or diaphoretic.  HENT:     Head: Normocephalic and atraumatic.     Right Ear: Hearing and external ear normal.     Left Ear: Hearing and external ear normal.     Nose: Nose normal. No nasal deformity.     Mouth/Throat:     Lips: Pink.     Mouth: Mucous membranes are moist.     Tongue: No lesions.     Pharynx: Oropharynx is clear.  Eyes:     Extraocular Movements: Extraocular movements intact.  Cardiovascular:     Rate and Rhythm: Normal rate and regular rhythm.     Pulses: Normal pulses.     Heart sounds: Normal heart sounds.  Pulmonary:     Effort: Pulmonary effort is normal.     Breath sounds: Normal breath sounds.  Abdominal:     General: Bowel sounds are normal. There is no distension.     Palpations: Abdomen is soft. There is no mass.     Tenderness: There is no abdominal tenderness. There is no guarding.     Hernia: No hernia is present.  Musculoskeletal:     Right lower leg: No edema.  Left lower leg: No edema.  Skin:    General: Skin is warm.  Neurological:     General: No focal deficit present.     Mental Status: She is alert and oriented to person, place, and time.     Cranial Nerves: Cranial nerves 2-12 are intact.      Motor: Motor function is intact.  Psychiatric:        Attention and Perception: Attention normal.        Mood and Affect: Mood normal.        Speech: Speech normal.        Behavior: Behavior normal. Behavior is cooperative.        Cognition and Memory: Cognition normal.       Assessment and Plan: * Pulmonary embolism (HCC) Heparin gtt per pharmacy consult and recommendation.   Acute hypoxic respiratory failure (HCC) 2/2 to PE. Although not directly provoked from trauma or surgery.  Pt has multiple risk factors for VTE including cancer of breast. Colon and recent covid.  Pt started on heparin gtt.  We will continue oxygen. SpO2: 98 %   Hypertension Vitals:   05/27/22 1856  BP: 107/87  Hold hctz and hydralazine.     Anemia Attribute to anemia of chronic disease and also possibly IDA. We will type/ screen. IV PPI.   GERD (gastroesophageal reflux disease) IV ppi.    DVT prophylaxis:  Heparin gtt.   Code Status:  Full code    Family Communication:  Conway,Neal (Spouse)  [phone]   Disposition Plan:  Home   Consults called:  None  Admission status: Inpatient   Unit/ Expected LOS: Tele / 2 days.    Para Skeans MD [facility]  6 PM- 2 AM. Please contact me via secure Chat 6 PM-2 AM. [phone]( Pager ) To contact the [facility] Attending or Consulting provider [place] or covering provider during after hours [place], for this patient.   Check the care team in [facility] and look for a) attending/consulting [facility] provider listed and b) the [facility] team listed Log into [email or web address] and use [place]'s universal password to access. If you do not have the password, please contact the hospital operator. Locate the [facility] provider you are looking for under [facility] and page to a number that you can be directly reached. If you still have difficulty reaching the provider, please page the [facility] (Director on Call) for the Hospitalists listed on amion for  assistance. [email or web address] 05/27/2022, 9:39 PM

## 2022-05-27 NOTE — ED Triage Notes (Signed)
Pt to ED via POV from home. Pt reports right lower back pain that started yesterday. Pt denies urinary symptoms. Pt reports she is finishing her last dose of Paxlovid.

## 2022-05-28 DIAGNOSIS — I2693 Single subsegmental pulmonary embolism without acute cor pulmonale: Secondary | ICD-10-CM

## 2022-05-28 LAB — CBC
HCT: 26.8 % — ABNORMAL LOW (ref 36.0–46.0)
HCT: 29.1 % — ABNORMAL LOW (ref 36.0–46.0)
Hemoglobin: 8.5 g/dL — ABNORMAL LOW (ref 12.0–15.0)
Hemoglobin: 9.2 g/dL — ABNORMAL LOW (ref 12.0–15.0)
MCH: 32.3 pg (ref 26.0–34.0)
MCH: 32.5 pg (ref 26.0–34.0)
MCHC: 31.7 g/dL (ref 30.0–36.0)
MCHC: 31.6 g/dL (ref 30.0–36.0)
MCV: 101.9 fL — ABNORMAL HIGH (ref 80.0–100.0)
MCV: 102.8 fL — ABNORMAL HIGH (ref 80.0–100.0)
Platelets: 189 10*3/uL (ref 150–400)
Platelets: 205 10*3/uL (ref 150–400)
RBC: 2.63 MIL/uL — ABNORMAL LOW (ref 3.87–5.11)
RBC: 2.83 MIL/uL — ABNORMAL LOW (ref 3.87–5.11)
RDW: 15.9 % — ABNORMAL HIGH (ref 11.5–15.5)
RDW: 15.9 % — ABNORMAL HIGH (ref 11.5–15.5)
WBC: 11.4 10*3/uL — ABNORMAL HIGH (ref 4.0–10.5)
WBC: 12 10*3/uL — ABNORMAL HIGH (ref 4.0–10.5)
nRBC: 0 % (ref 0.0–0.2)
nRBC: 0 % (ref 0.0–0.2)

## 2022-05-28 LAB — HEPARIN LEVEL (UNFRACTIONATED)
Heparin Unfractionated: >1.1 IU/mL — ABNORMAL HIGH (ref 0.30–0.70)
Heparin Unfractionated: 0.25 IU/mL — ABNORMAL LOW (ref 0.30–0.70)

## 2022-05-28 LAB — VITAMIN B12: Vitamin B-12: 1498 pg/mL — ABNORMAL HIGH (ref 180–914)

## 2022-05-28 LAB — TROPONIN I (HIGH SENSITIVITY): Troponin I (High Sensitivity): 11 ng/L (ref <18)

## 2022-05-28 MED ORDER — ORAL CARE MOUTH RINSE: 15.0000 mL | OROMUCOSAL | Status: DC | PRN

## 2022-05-28 MED ORDER — HEPARIN (PORCINE) 25000 UT/250ML-% IV SOLN
1100.0000 [IU]/h | INTRAVENOUS | Status: DC
  Administered 2022-05-28: 1000 [IU]/h via INTRAVENOUS
  Administered 2022-05-28: 950 [IU]/h via INTRAVENOUS
  Filled 2022-05-28: qty 250

## 2022-05-28 MED ORDER — SODIUM CHLORIDE 0.9 % IV SOLN
1.0000 mg | Freq: Once | INTRAVENOUS | Status: AC
  Administered 2022-05-28: 1 mg via INTRAVENOUS
  Filled 2022-05-28: qty 0.2

## 2022-05-28 NOTE — Progress Notes (Signed)
PT Cancellation Note  Patient Details Name: Sabrina Wilson MRN: [id number] DOB: [birth year]   Cancelled Treatment:    Reason Eval/Treat Not Completed: Other (comment). Per chart, positive for PE started anticoag on 2/2 at 2223. Discussed with MD, will hold off per protocol for 48 hrs. Will continue to assess medical stability.   Carine Nordgren 05/28/2022, 10:25 AM Greggory Stallion, PT, DPT, GCS [phone]

## 2022-05-28 NOTE — Progress Notes (Signed)
       CROSS COVER NOTE  NAME: Sabrina Wilson MRN: [id number] DOB : 1940/11/01    HPI/Events of Note   "Pt on heparin @ 1200/hour Hgb dropped from 10.1 to 8.5 Platelets dropped from 244 to 189 These results are from a straight stick".   Assessment and  Interventions   Assessment: Possible acute anemia without overt bleeding Thrombocytopenia --Baseline hemoglobin was 8.8 a couple weeks prior (Hb of 10.1 could have been hemoconcentrated or ? inaccurate) -- Thrombocytopenia could be consumptive --CTA chest shows right segmental to subsegmental PE with distal developing infarction and lower extremity Doppler shows left lower extremity venous thrombosis Plan: Hold heparin and restart at lower end of range in 1-2 hours Repeat CBC in two hours Consulted vascular, Dr Bridgett Larsson, for possibility of filter if unable to continue heparin infusion, albeit with persistent hypercoagulability from recent COVID and history of cancer

## 2022-05-28 NOTE — Progress Notes (Signed)
  Progress Note   Patient: Sabrina Wilson HKV:[id number] DOB: Sep 14, 1940 DOA: 05/27/2022     1 DOS: the patient was seen and examined on 05/28/2022   Brief hospital course:  Assessment and Plan: * Pulmonary embolism ([place]) - Heparin drip with pharmacy monitoring  - Norco/vicodin 5-325 q4 hr PRN  - IV morphine PRN   Acute hypoxic respiratory failure (HCC) - Heparin drip as above  - Proventil q6 hr PRN  - Incruse ellipta 1 puff daily  - Mucinex 600 mg PO bid   HLD - Lipitor 10 mg PO daily   HTN - HCTZ 12.5 mg PO daily   DVT prophylaxis: Heparin drip as above  GI prophylaxis: IV protonix 40 mg q12      Subjective: Pt seen and examined at the bedside. Continues on heparin drip. Hgb uptrending 10.1 -->8.5-->9.2.  Vascular surgery consult appreciated, no indication for IVC filter at this time.   Physical Exam: Vitals:   05/28/22 0600 05/28/22 0630 05/28/22 0700 05/28/22 0730  BP: (!) 111/58 (!) 119/92 (!) 113/57 128/67  Pulse: 75 85 79 83  Resp: (!) '21 18 20 18  '$ Temp:    98.3 F (36.8 C)  TempSrc:    Oral  SpO2: 100% 99% 100% 93%  Weight:      Height:       Physical Exam Constitutional:      Appearance: Normal appearance.  HENT:     Head: Normocephalic and atraumatic.     Mouth/Throat:     Mouth: Mucous membranes are moist.  Cardiovascular:     Rate and Rhythm: Normal rate and regular rhythm.  Pulmonary:     Effort: Pulmonary effort is normal.  Abdominal:     General: Abdomen is flat.     Palpations: Abdomen is soft.  Musculoskeletal:     Cervical back: Neck supple.  Skin:    General: Skin is warm and dry.  Neurological:     Mental Status: She is alert. Mental status is at baseline.  Psychiatric:        Mood and Affect: Mood normal.    Data Reviewed:    Disposition: Status is: Inpatient  Planned Discharge Destination: Home    Time spent: 35 minutes  Author: Lucienne Minks , MD 05/28/2022 1:57 PM  For on call review [email or web address].

## 2022-05-28 NOTE — Consult Note (Signed)
ANTICOAGULATION CONSULT NOTE - Initial Consult  Pharmacy Consult for Heparin infusion Indication: pulmonary embolus  Allergies  Allergen Reactions   Sulfa Antibiotics Hives    As a child    Patient Measurements: Height: '5\' 6"'$  (167.6 cm) Weight: 81.5 kg (179 lb 10.8 oz) IBW/kg (Calculated) : 59.3 Heparin Dosing Weight: 76.3kg  Vital Signs: Temp: 99.1 F (37.3 C) (02/03 1438) Temp Source: Oral (02/03 1438) BP: 112/59 (02/03 1438) Pulse Rate: 94 (02/03 1438)  Labs: Recent Labs    05/27/22 1852 05/27/22 2206 05/28/22 0530 05/28/22 0925 05/28/22 1443  HGB 10.1*  --  8.5* 9.2*  --   HCT 30.5*  --  26.8* 29.1*  --   PLT 244  --  189 205  --   APTT  --  30  --   --   --   LABPROT  --  14.5  --   --   --   INR  --  1.1  --   --   --   HEPARINUNFRC  --   --  >1.10*  --  0.25*  CREATININE 0.89  --   --   --   --   TROPONINIHS 8  --  11  --   --      Estimated Creatinine Clearance: 55.2 mL/min (by C-G formula based on SCr of 0.89 mg/dL).  Medical History: Past Medical History:  Diagnosis Date   Actinic keratosis 02/12/2020   R lat calf (hypertrophic)   Arthritis    Cancer of sigmoid ([place]) 06/17/2016   Colon cancer ([place]) 2009   T3,N0; s/p resection  Dr. Hulda Humphrey and chemotherapy by Dr. Cynda Acres   Diastolic dysfunction    a. 08/2020 Echo: EF 60-65%, no rwma, Gr1 DD, nl RV size/fxn. Mild MR.   Hernia of flank    History of actinic keratoses 08/11/2020   Bx proven at left lateral ankle proximal, LN2 10/08/20   History of stress test    a. 09/2020 MV: EF >65%. No ischemia/infarct. Mild Ao Ca2+ and minimal Cor Ca2+.   Hypertension    Neuropathy    Polyp at cervical os    s/p resection Dr. Sabra Heck   Skin cancer 01/22/2020   surface of an atypical verrucous squamous proliferation    Squamous cell carcinoma of skin 07/16/2020   Left lateral ankle anterior, [facility] with [facility] 08-11-2020    Medications:  No AC prior to admission.   *Hgb and PLT appropriate to start infusion*  Baseline INR 1.1 and aPTT 30  Assessment: PMH includes  hx of colon cancer and breast cancer, recent chemotherapy, COVID pneumonia admission 1/18. Pharmacy consulted to manage heparin infusion for new dx PE.  2/3 0530 HL > 1.10, Supratherapeutic 2/3 1443 HL = 0.25, Subtherapeutic  Goal of Therapy:  Heparin level 0.3-0.5 units/ml Monitor platelets by anticoagulation protocol: Yes   Plan:  Heparin subratherapeutic. Increase heparin infusion rate to 1000u/hr.  Check anti-Xa level in 8 hours following rate change Continue to monitor H&H and platelets  Alison Murray, PharmD Clinical Pharmacist   05/28/2022 3:53 PM

## 2022-05-28 NOTE — Consult Note (Signed)
ANTICOAGULATION CONSULT NOTE - Initial Consult  Pharmacy Consult for Heparin infusion Indication: pulmonary embolus  Allergies  Allergen Reactions   Sulfa Antibiotics Hives    As a child    Patient Measurements: Height: '5\' 6"'$  (167.6 cm) Weight: 81.5 kg (179 lb 10.8 oz) IBW/kg (Calculated) : 59.3 Heparin Dosing Weight: 76.3kg  Vital Signs: Temp: 98.9 F (37.2 C) (02/02 1856) Temp Source: Oral (02/02 1856) BP: 111/58 (02/03 0600) Pulse Rate: 75 (02/03 0600)  Labs: Recent Labs    05/27/22 1852 05/27/22 2206 05/28/22 0530  HGB 10.1*  --  8.5*  HCT 30.5*  --  26.8*  PLT 244  --  189  APTT  --  30  --   LABPROT  --  14.5  --   INR  --  1.1  --   HEPARINUNFRC  --   --  >1.10*  CREATININE 0.89  --   --   TROPONINIHS 8  --  11     Estimated Creatinine Clearance: 55.2 mL/min (by C-G formula based on SCr of 0.89 mg/dL).  Medical History: Past Medical History:  Diagnosis Date   Actinic keratosis 02/12/2020   R lat calf (hypertrophic)   Arthritis    Cancer of sigmoid ([place]) 06/17/2016   Colon cancer ([place]) 2009   T3,N0; s/p resection  Dr. Hulda Humphrey and chemotherapy by Dr. Cynda Acres   Diastolic dysfunction    a. 08/2020 Echo: EF 60-65%, no rwma, Gr1 DD, nl RV size/fxn. Mild MR.   Hernia of flank    History of actinic keratoses 08/11/2020   Bx proven at left lateral ankle proximal, LN2 10/08/20   History of stress test    a. 09/2020 MV: EF >65%. No ischemia/infarct. Mild Ao Ca2+ and minimal Cor Ca2+.   Hypertension    Neuropathy    Polyp at cervical os    s/p resection Dr. Sabra Heck   Skin cancer 01/22/2020   surface of an atypical verrucous squamous proliferation    Squamous cell carcinoma of skin 07/16/2020   Left lateral ankle anterior, treated with [facility] 08-11-2020    Medications:  No AC prior to admission.   *Hgb and PLT appropriate to start infusion* Baseline INR and aPTT ordered  Assessment: PMH includes  hx of colon cancer and breast cancer, recent chemotherapy,  COVID pneumonia admission 1/18. Pharmacy consulted to manage heparin infusion for new dx PE.  2/3 0530 HL > 1.10,  Supratherapeutic  Goal of Therapy:  Heparin level 0.3-0.5 units/ml Monitor platelets by anticoagulation protocol: Yes   Plan:  Heparin supratherapeutic. Unsure of accuracy given the first level and both IV lines on same arm After dicussion with MD, given drop in Hgb will hold heparin infusion x 1 hour and resume at lower rate of 950 units/hr. Will target lower end of therapeutic range of 0.3-0.5 Check anti-Xa level in 8 hours following rate change Continue to monitor H&H and platelets  Dorothe Pea, PharmD, BCPS Clinical Pharmacist   05/28/2022 6:17 AM

## 2022-05-28 NOTE — Consult Note (Signed)
VASCULAR SURGERY CONSULT   Requested by:  Judd Gaudier, MD (Hospitalist)  Reason for consultation: PE    History of Present Illness   Sabrina Wilson is a 82 y.o. (15-Sep-1940) female w/ known colon, skin cancer, and most recently breast cancer who presents with cc: R chest pain.  Pt was recently admitted with B PNA last month.  Pt denies any fever or chills.  Pt denies any hemoptysis.  Pt notes continued SOB.  Pt denies any melena or hematochezia.  Pt also notes swelling in BOTH legs over the last few weeks.     Past Medical History:  Diagnosis Date   Actinic keratosis 02/12/2020   R lat calf (hypertrophic)   Arthritis    Cancer of sigmoid ([place]) 06/17/2016   Colon cancer ([place]) 2009   T3,N0; s/p resection  Dr. Hulda Humphrey and chemotherapy by Dr. Cynda Acres   Diastolic dysfunction    a. 08/2020 Echo: EF 60-65%, no rwma, Gr1 DD, nl RV size/fxn. Mild MR.   Hernia of flank    History of actinic keratoses 08/11/2020   Bx proven at left lateral ankle proximal, LN2 10/08/20   History of stress test    a. 09/2020 MV: EF >65%. No ischemia/infarct. Mild Ao Ca2+ and minimal Cor Ca2+.   Hypertension    Neuropathy    Polyp at cervical os    s/p resection Dr. Sabra Heck   Skin cancer 01/22/2020   surface of an atypical verrucous squamous proliferation    Squamous cell carcinoma of skin 07/16/2020   Left lateral ankle anterior, treated with [facility] 08-11-2020    Past Surgical History:  Procedure Laterality Date   BREAST BIOPSY Right 05/2014   Dr. Dwyane Luo office-benign   BREAST LUMPECTOMY WITH SENTINEL LYMPH NODE BIOPSY Right 01/21/2022   Procedure: BREAST LUMPECTOMY WITH SENTINEL LYMPH NODE BX;  Surgeon: Robert Bellow, MD;  Location: [facility] ORS;  Service: General;  Laterality: Right;   BREAST SURGERY Right February 2016   Vacuum assisted biopsy for recurrent cyst, fibrocystic changes without evidence of malignancy.   CATARACT EXTRACTION W/PHACO Left 01/13/2015   Procedure: CATARACT EXTRACTION  PHACO AND INTRAOCULAR LENS PLACEMENT (IOC);  Surgeon: Birder Robson, MD;  Location: [facility] ORS;  Service: Ophthalmology;  Laterality: Left;  [place]  1:02.9 AP   19.2 CDE  12.06 casette lot #  [id number]   CATARACT EXTRACTION W/PHACO Right 02/03/2015   Procedure: CATARACT EXTRACTION PHACO AND INTRAOCULAR LENS PLACEMENT (IOC);  Surgeon: Birder Robson, MD;  Location: [facility] ORS;  Service: Ophthalmology;  Laterality: Right;  [id number] [id number] [id number]   COLECTOMY     COLONOSCOPY  2015   Dr Candace Cruise   COLONOSCOPY WITH PROPOFOL N/A 09/13/2017   Procedure: COLONOSCOPY WITH PROPOFOL;  Surgeon: Robert Bellow, MD;  Location: [facility] ENDOSCOPY;  Service: Endoscopy;  Laterality: N/A;   COLONOSCOPY WITH PROPOFOL N/A 11/08/2017   Procedure: COLONOSCOPY WITH PROPOFOL;  Surgeon: Robert Bellow, MD;  Location: [facility] ENDOSCOPY;  Service: Endoscopy;  Laterality: N/A;   colostomy reversal     DILATION AND CURETTAGE OF UTERUS  2014   Dr. Sabra Heck, benign per pt   EYE SURGERY     HERNIA REPAIR  07/13/12   ventral    SKIN CANCER EXCISION     TONSILLECTOMY       Social History   Socioeconomic History   Marital status: Married    Spouse name: Not on file   Number of children: Not on file   Years of  education: Not on file   Highest education level: Not on file  Occupational History   Not on file  Tobacco Use   Smoking status: Former    Types: Cigarettes    Quit date: 10/12/2007    Years since quitting: 14.6   Smokeless tobacco: Never  Vaping Use   Vaping Use: Never used  Substance and Sexual Activity   Alcohol use: Yes    Alcohol/week: 1.0 standard drink of alcohol    Types: 1 Standard drinks or equivalent per week    Comment: with dinner GLASS OF WINE EACH DAY   Drug use: No   Sexual activity: Not on file  Other Topics Concern   Not on file  Social History Narrative   Lives in [place] with husband. 2 children, son lives nearby.Diet - regularExercise - none. 30 mins/in [place]. Quit smoking in  2009. Wine before dinner. Pianist in church; real estate; Adult nurse.    Social Determinants of Health   Financial Resource Strain: Low Risk  (05/19/2022)   Overall Financial Resource Strain (CARDIA)    Difficulty of Paying Living Expenses: Not very hard  Food Insecurity: No Food Insecurity (05/13/2022)   Hunger Vital Sign    Worried About Running Out of Food in the Last Year: Never true    Ran Out of Food in the Last Year: Never true  Transportation Needs: No Transportation Needs (05/13/2022)   PRAPARE - Hydrologist (Medical): No    Lack of Transportation (Non-Medical): No  Physical Activity: Not on file  Stress: No Stress Concern Present (11/16/2020)   [place]    Feeling of Stress : Not at all  Social Connections: Not on file  Intimate Partner Violence: Not At Risk (05/13/2022)   Humiliation, Afraid, Rape, and Kick questionnaire    Fear of Current or Ex-Partner: No    Emotionally Abused: No    Physically Abused: No    Sexually Abused: No    Family History  Problem Relation Age of Onset   Stroke Mother    Diabetes Mother    Cancer Father        colon   Stroke Sister    Cancer Brother        colon   Cancer Brother        brain    Current Facility-Administered Medications  Medication Dose Route Frequency Provider Last Rate Last Admin   acetaminophen (TYLENOL) tablet 650 mg  650 mg Oral Q6H PRN Para Skeans, MD       Or   acetaminophen (TYLENOL) suppository 650 mg  650 mg Rectal Q6H PRN Para Skeans, MD       albuterol (PROVENTIL) (2.5 MG/3ML) 0.083% nebulizer solution 3 mL  3 mL Nebulization Q6H PRN Para Skeans, MD       atorvastatin (LIPITOR) tablet 10 mg  10 mg Oral Daily Florina Ou V, MD   10 mg at 05/28/22 0931   guaiFENesin (MUCINEX) 12 hr tablet 600 mg  600 mg Oral BID PRN Para Skeans, MD       heparin ADULT infusion 100 units/mL (25000 units/279m)  950 Units/hr  Intravenous Continuous DDorothe Pea RPH 9.5 mL/hr at 05/28/22 0723 950 Units/hr at 05/28/22 0723   hydrALAZINE (APRESOLINE) injection 5 mg  5 mg Intravenous Q4H PRN PPara Skeans MD       hydrochlorothiazide (HYDRODIURIL) tablet 12.5 mg  12.5 mg Oral  Daily Florina Ou V, MD   12.5 mg at 05/28/22 0932   HYDROcodone-acetaminophen (NORCO/VICODIN) 5-325 MG per tablet 1-2 tablet  1-2 tablet Oral Q4H PRN Para Skeans, MD   1 tablet at 05/28/22 0236   lidocaine (LIDODERM) 5 % 1 patch  1 patch Transdermal Q24H Para Skeans, MD   1 patch at 05/27/22 1951   morphine (PF) 2 MG/ML injection 2 mg  2 mg Intravenous Q2H PRN Para Skeans, MD       morphine (PF) 4 MG/ML injection 4 mg  4 mg Intravenous Q3H PRN Para Skeans, MD   4 mg at 05/28/22 0628   ondansetron (ZOFRAN) tablet 4 mg  4 mg Oral Q6H PRN Para Skeans, MD       Or   ondansetron [facility]) injection 4 mg  4 mg Intravenous Q6H PRN Para Skeans, MD       pantoprazole (PROTONIX) injection 40 mg  40 mg Intravenous Q12H Florina Ou V, MD   40 mg at 05/28/22 0931   sodium chloride flush (NS) 0.9 % injection 3 mL  3 mL Intravenous Q12H Florina Ou V, MD   3 mL at 05/27/22 2220   umeclidinium bromide (INCRUSE ELLIPTA) 62.5 MCG/ACT 1 puff  1 puff Inhalation Daily Para Skeans, MD   1 puff at 05/28/22 1005   Current Outpatient Medications  Medication Sig Dispense Refill   atorvastatin (LIPITOR) 10 MG tablet Take 1 tablet (10 mg total) by mouth daily. 90 tablet 1   Calcium Carbonate-Vit D-Min (CALCIUM 1200 PO) Take 1 tablet by mouth daily.     hydrochlorothiazide (HYDRODIURIL) 12.5 MG tablet TAKE ONE TABLET BY MOUTH DAILY (Patient taking differently: Take 12.5 mg by mouth daily.) 90 tablet 1   niacinamide 500 MG tablet Take 500 mg by mouth 2 (two) times daily.     nirmatrelvir & ritonavir (PAXLOVID, 300/100,) 20 x 150 MG & 10 x '100MG'$  TBPK Take by mouth.     Omega-3 Fatty Acids (FISH OIL PO) Take 1 capsule by mouth daily.     ondansetron (ZOFRAN) 8  MG tablet One pill every 8 hours as needed for nausea/vomitting. 40 tablet 1   prochlorperazine (COMPAZINE) 10 MG tablet Take 1 tablet (10 mg total) by mouth every 6 (six) hours as needed for nausea or vomiting. 40 tablet 1   Ruxolitinib Phosphate (OPZELURA) 1.5 % CREA Apply 1 application topically daily. (Patient taking differently: Apply 1 application  topically daily. LEGS) 60 g 2   umeclidinium bromide (INCRUSE ELLIPTA) 62.5 MCG/ACT AEPB Inhale 1 puff into the lungs daily. 30 each 0   VITAMIN D, CHOLECALCIFEROL, PO Take 2,000 Units by mouth daily.     albuterol (VENTOLIN HFA) 108 (90 Base) MCG/ACT inhaler Inhale 2 puffs into the lungs every 6 (six) hours as needed for wheezing or shortness of breath. 8 g 2    Allergies  Allergen Reactions   Sulfa Antibiotics Hives    As a child    REVIEW OF SYSTEMS (negative unless checked):   Cardiac:  '[x]'$  Chest pain or chest pressure? '[x]'$  Shortness of breath upon activity? '[x]'$  Shortness of breath when lying flat? '[]'$  Irregular heart rhythm?  Vascular:  '[x]'$  Pain in calf, thigh, or hip brought on by walking? '[]'$  Pain in feet at night that wakes you up from your sleep? '[]'$  Blood clot in your veins? '[]'$  Leg swelling?  Pulmonary:  '[]'$  Oxygen at home? '[]'$  Productive cough? '[]'$  Wheezing?  Neurologic:  '[]'$   Sudden weakness in arms or legs? '[]'$  Sudden numbness in arms or legs? '[]'$  Sudden onset of difficult speaking or slurred speech? '[]'$  Temporary loss of vision in one eye? '[]'$  Problems with dizziness?  Gastrointestinal:  '[]'$  Blood in stool? '[]'$  Vomited blood?  Genitourinary:  '[]'$  Burning when urinating? '[]'$  Blood in urine?  Psychiatric:  '[]'$  Major depression  Hematologic:  '[]'$  Bleeding problems? '[]'$  Problems with blood clotting?  Dermatologic:  '[]'$  Rashes or ulcers?  Constitutional:  '[]'$  Fever or chills?  Ear/Nose/Throat:  '[]'$  Change in hearing? '[]'$  Nose bleeds? '[]'$  Sore throat?  Musculoskeletal:  '[]'$  Back pain? '[]'$  Joint pain? '[]'$  Muscle  pain?   Physical Examination     Vitals:   05/28/22 0600 05/28/22 0630 05/28/22 0700 05/28/22 0730  BP: (!) 111/58 (!) 119/92 (!) 113/57 128/67  Pulse: 75 85 79 83  Resp: (!) '21 18 20 18  '$ Temp:    98.3 F (36.8 C)  TempSrc:    Oral  SpO2: 100% 99% 100% 93%  Weight:      Height:       Body mass index is 29 kg/m.  General Alert, O x 3, WD, ill appearing, missing hair  Ear/Nose/ Throat Hearing grossly intact, nares without erythema or drainage  Neck Mid-line trachea, no JVD  Pulmonary Sym exp, good B air movt,  faint rales in BLL  Cardiac RRR, Nl S1, S2, no Murmurs, No rubs, No S3,S4  Vascular Palpable DP L>R  Gastro- intestinal soft, non-distended, non-tender to palpation, No guarding or rebound, no HSM, no masses, no CVAT B, No palpable prominent aortic pulse,    Musculo- skeletal M/S 5/5 throughout  , Extremities without ischemic changes  , Non-pitting edema present: B 1-2+  Neurologic Grossly intact CN, sensation grossly intact, Motor exam as listed above  Psychiatric Judgement intact, Mood & affect appropriate for pt's clinical situation    Non-invasive Vascular Imaging     BLE Venous Duplex (05/27/22): 1. Nonocclusive thrombus within the left popliteal, posterior tibial and peroneal veins. 2. No evidence of deep venous thrombosis in the right lower extremity.  Radiology     US Venous Img Lower Bilateral  Result Date: 05/27/2022 CLINICAL DATA:  PE EXAM: BILATERAL LOWER EXTREMITY VENOUS DOPPLER ULTRASOUND TECHNIQUE: Gray-scale sonography with graded compression, as well as color Doppler and duplex ultrasound were performed to evaluate the lower extremity deep venous systems from the level of the common femoral vein and including the common femoral, femoral, profunda femoral, popliteal and calf veins including the posterior tibial, peroneal and gastrocnemius veins when visible. The superficial great saphenous vein was also interrogated. Spectral Doppler was utilized  to evaluate flow at rest and with distal augmentation maneuvers in the common femoral, femoral and popliteal veins. COMPARISON:  None Available. FINDINGS: RIGHT LOWER EXTREMITY Common Femoral Vein: No evidence of thrombus. Normal compressibility, respiratory phasicity and response to augmentation. Saphenofemoral Junction: No evidence of thrombus. Normal compressibility and flow on color Doppler imaging. Profunda Femoral Vein: No evidence of thrombus. Normal compressibility and flow on color Doppler imaging. Femoral Vein: No evidence of thrombus. Normal compressibility, respiratory phasicity and response to augmentation. Popliteal Vein: No evidence of thrombus. Normal compressibility, respiratory phasicity and response to augmentation. Calf Veins: No evidence of thrombus. Normal compressibility and flow on color Doppler imaging. Superficial Great Saphenous Vein: No evidence of thrombus. Normal compressibility. Venous Reflux:  None. Other Findings:  None. LEFT LOWER EXTREMITY Common Femoral Vein: No evidence of thrombus. Normal compressibility, respiratory phasicity and response to  augmentation. Saphenofemoral Junction: No evidence of thrombus. Normal compressibility and flow on color Doppler imaging. Profunda Femoral Vein: No evidence of thrombus. Normal compressibility and flow on color Doppler imaging. Femoral Vein: No evidence of thrombus. Normal compressibility, respiratory phasicity and response to augmentation. Popliteal Vein: There is nonocclusive thrombus within the popliteal vein. Calf Veins: There is nonocclusive thrombus within the posterior tibial and peroneal veins. Superficial Great Saphenous Vein: No evidence of thrombus. Normal compressibility. Venous Reflux:  None. Other Findings:  None. IMPRESSION: 1. Nonocclusive thrombus within the left popliteal, posterior tibial and peroneal veins. 2. No evidence of deep venous thrombosis in the right lower extremity. Electronically Signed   By: Ronney Asters  M.D.   On: 05/27/2022 21:54   CT HEAD WO CONTRAST (5MM)  Result Date: 05/27/2022 CLINICAL DATA:  Metastatic disease evaluation EXAM: CT HEAD WITHOUT CONTRAST TECHNIQUE: Contiguous axial images were obtained from the base of the skull through the vertex without intravenous contrast. RADIATION DOSE REDUCTION: This exam was performed according to the departmental dose-optimization program which includes automated exposure control, adjustment of the mA and/or kV according to patient size and/or use of iterative reconstruction technique. COMPARISON:  None Available. FINDINGS: Brain: There is no mass, hemorrhage or extra-axial collection. The size and configuration of the ventricles and extra-axial CSF spaces are normal. The brain parenchyma is normal, without acute or chronic infarction. No enhancing lesions. Vascular: Atherosclerotic calcification of the vertebral and internal carotid arteries at the skull base. No abnormal hyperdensity of the major intracranial arteries or dural venous sinuses. Skull: The visualized skull base, calvarium and extracranial soft tissues are normal. Sinuses/Orbits: No fluid levels or advanced mucosal thickening of the visualized paranasal sinuses. No mastoid or middle ear effusion. The orbits are normal. IMPRESSION: 1. No acute intracranial abnormality. No enhancing lesions. 2. Atherosclerotic calcification of the vertebral and internal carotid arteries at the skull base. Electronically Signed   By: Ulyses Jarred M.D.   On: 05/27/2022 20:51   CT ABDOMEN PELVIS W CONTRAST  Result Date: 05/27/2022 CLINICAL DATA:  Right lower back pain since yesterday EXAM: CT ABDOMEN AND PELVIS WITH CONTRAST TECHNIQUE: Multidetector CT imaging of the abdomen and pelvis was performed using the standard protocol following bolus administration of intravenous contrast. RADIATION DOSE REDUCTION: This exam was performed according to the departmental dose-optimization program which includes automated exposure  control, adjustment of the mA and/or kV according to patient size and/or use of iterative reconstruction technique. CONTRAST:  167m OMNIPAQUE IOHEXOL 350 MG/ML SOLN COMPARISON:  05/12/2022 06/12/2012 FINDINGS: Lower chest: Please see separately reported examination of the chest. Hepatobiliary: No solid liver abnormality is seen. Gallstones and sludge. No gallbladder wall thickening, or biliary dilatation. Pancreas: Unremarkable. No pancreatic ductal dilatation or surrounding inflammatory changes. Spleen: Normal in size without significant abnormality. Adrenals/Urinary Tract: Adrenal glands are unremarkable. Simple, benign renal cortical cysts, for which no further follow-up or characterization is required. Kidneys are otherwise normal, without renal calculi, solid lesion, or hydronephrosis. Bladder is unremarkable. Stomach/Bowel: Stomach is within normal limits. Appendix appears normal. No evidence of bowel wall thickening, distention, or inflammatory changes. Large burden of stool in the proximal colon. Status post sigmoid colon resection and reanastomosis. Vascular/Lymphatic: Aortic atherosclerosis. No enlarged abdominal or pelvic lymph nodes. Reproductive: Calcified uterine fibroids. Right ovarian cyst, again unchanged compared to remote prior examination dated 2014 and definitively benign. No follow-up imaging recommended. Note: This recommendation does not apply to premenarchal patients and to those with increased risk (genetic, family history, elevated tumor markers or  other high-risk factors) of ovarian cancer. Reference: JACR 2020 Feb; 17(2):248-254 Other: No abdominal wall hernia or abnormality. No ascites. Musculoskeletal: No acute or significant osseous findings. IMPRESSION: 1. No acute CT findings of the abdomen or pelvis to explain right lower quadrant pain. 2. Cholelithiasis without evidence of acute cholecystitis. 3. Large burden of stool in the proximal colon. Aortic Atherosclerosis (ICD10-I70.0).  Electronically Signed   By: Delanna Ahmadi M.D.   On: 05/27/2022 20:26   CT Angio Chest PE W and/or Wo Contrast  Result Date: 05/27/2022 CLINICAL DATA:  PE suspected EXAM: CT ANGIOGRAPHY CHEST WITH CONTRAST TECHNIQUE: Multidetector CT imaging of the chest was performed using the standard protocol during bolus administration of intravenous contrast. Multiplanar CT image reconstructions and MIPs were obtained to evaluate the vascular anatomy. RADIATION DOSE REDUCTION: This exam was performed according to the departmental dose-optimization program which includes automated exposure control, adjustment of the mA and/or kV according to patient size and/or use of iterative reconstruction technique. CONTRAST:  128m OMNIPAQUE IOHEXOL 350 MG/ML SOLN COMPARISON:  05/12/2022 FINDINGS: Cardiovascular: Satisfactory opacification of the pulmonary arteries to the segmental level. Positive examination for pulmonary embolism, with new segmental to subsegmental embolus present in the right lower lobe (series 6, image 261). Normal heart size. RV LV ratio 0.9. Left and right coronary artery calcifications. No pericardial effusion. Aortic atherosclerosis. Mediastinum/Nodes: No enlarged mediastinal, hilar, or axillary lymph nodes. Thyroid gland, trachea, and esophagus demonstrate no significant findings. Lungs/Pleura: Diffuse bilateral bronchial wall thickening. Background of fine centrilobular pulmonary nodules, most concentrated in the lung apices. Rounded subpleural ground-glass opacity of the dependent right lower lobe, distal to embolus (series 5, image 119). No pleural effusion or pneumothorax. Upper Abdomen: Please see separately reported examination of the abdomen and pelvis. Musculoskeletal: No chest wall abnormality. No acute osseous findings. Review of the MIP images confirms the above findings. IMPRESSION: 1. Positive examination for pulmonary embolism, with new segmental to subsegmental embolus present in the right lower  lobe. 2. Rounded subpleural ground-glass opacity of the dependent right lower lobe, distal to embolus, most consistent with developing pulmonary infarct. 3. Diffuse bilateral bronchial wall thickening. 4. Background of fine centrilobular pulmonary nodules, most concentrated in the lung apices, consistent with smoking-related respiratory bronchiolitis. 5. Coronary artery disease. These results were called by telephone at the time of interpretation on 05/27/2022 at 8:16 pm to Dr. PMerlyn Lot, who verbally acknowledged these results. Aortic Atherosclerosis (ICD10-I70.0). Electronically Signed   By: ADelanna AhmadiM.D.   On: 05/27/2022 20:20     Laboratory   CBC    Latest Ref Rng & Units 05/28/2022    9:25 AM 05/28/2022    5:30 AM 05/27/2022    6:52 PM  CBC  WBC 4.0 - 10.5 K/uL 12.0  11.4  15.9   Hemoglobin 12.0 - 15.0 g/dL 9.2  8.5  10.1   Hematocrit 36.0 - 46.0 % 29.1  26.8  30.5   Platelets 150 - 400 K/uL 205  189  244     BMP    Latest Ref Rng & Units 05/27/2022    6:52 PM 05/17/2022    2:53 AM 05/15/2022    4:12 AM  BMP  Glucose 70 - 99 mg/dL 141  115    BUN 8 - 23 mg/dL 13  12    Creatinine 0.44 - 1.00 mg/dL 0.89  0.70  0.70   Sodium 135 - 145 mmol/L 131  134    Potassium 3.5 - 5.1 mmol/L 3.8  3.5  Chloride 98 - 111 mmol/L 94  102    CO2 22 - 32 mmol/L 27  24    Calcium 8.9 - 10.3 mg/dL 10.2  8.8      Coagulation Lab Results  Component Value Date   INR 1.1 05/27/2022   No results found for: "PTT"  Lipids    Component Value Date/Time   CHOL 201 (H) 05/24/2021 1447   TRIG 65.0 05/24/2021 1447   HDL 110.70 05/24/2021 1447   CHOLHDL 2 05/24/2021 1447   VLDL 13.0 05/24/2021 1447   LDLCALC 77 05/24/2021 1447   LDLDIRECT 66.0 09/08/2017 0925     Medical Decision Making   Sabrina Wilson is a 82 y.o. female who presents with: multiple prior cancer including breast cancer completing chemotherapy, recent B pneumonia, new PE and LLE DVT  Pt has no indication for PE or  IVC filter placement H/H stable as is PLT  ALL IVC filters are PROTHROMBOTIC so anticoagulation is still recommended with IVC filter placement; hence, the documented reduction in IVC filter placement  Pt needs to be started on anticoagulation given multiple etiologies are likely to contribute to thrombophilia Thank you for allowing [place] to participate in this patient's care.   Adele Barthel, MD, FACS, FSVS  05/28/2022, 10:16 AM

## 2022-05-28 NOTE — Progress Notes (Signed)
OT Cancellation Note  Patient Details Name: Sabrina Wilson MRN: [id number] DOB: 06-19-1940   Cancelled Treatment:    Reason Eval/Treat Not Completed: Medical issues which prohibited therapyPer chart, positive for PE started anticoag on 2/2 at 2223. Discussed with MD, will hold off per protocol for 48 hrs. Will continue to assess medical stability.   Darleen Crocker, MS, OTR/L , CBIS ascom [phone]  05/28/22, 10:29 AM

## 2022-05-28 NOTE — ED Notes (Signed)
Informed RN bed assigned 

## 2022-05-28 NOTE — ED Notes (Addendum)
RN called MD to notify that patients Hgb and platelets dropped from previous CBC yesterday and pt is currently receiving IV heparin.    MD discontinued heparin and consulted surgery for placement of IVC filter.

## 2022-05-29 DIAGNOSIS — C50919 Malignant neoplasm of unspecified site of unspecified female breast: Secondary | ICD-10-CM

## 2022-05-29 DIAGNOSIS — E871 Hypo-osmolality and hyponatremia: Secondary | ICD-10-CM

## 2022-05-29 DIAGNOSIS — I10 Essential (primary) hypertension: Secondary | ICD-10-CM

## 2022-05-29 DIAGNOSIS — J9601 Acute respiratory failure with hypoxia: Secondary | ICD-10-CM

## 2022-05-29 DIAGNOSIS — I82402 Acute embolism and thrombosis of unspecified deep veins of left lower extremity: Secondary | ICD-10-CM

## 2022-05-29 DIAGNOSIS — D649 Anemia, unspecified: Secondary | ICD-10-CM

## 2022-05-29 DIAGNOSIS — I2699 Other pulmonary embolism without acute cor pulmonale: Secondary | ICD-10-CM | POA: Diagnosis not present

## 2022-05-29 DIAGNOSIS — I82409 Acute embolism and thrombosis of unspecified deep veins of unspecified lower extremity: Secondary | ICD-10-CM

## 2022-05-29 HISTORY — DX: Hypo-osmolality and hyponatremia: E87.1

## 2022-05-29 HISTORY — DX: Hypomagnesemia: E83.42

## 2022-05-29 HISTORY — DX: Acute embolism and thrombosis of unspecified deep veins of unspecified lower extremity: I82.409

## 2022-05-29 LAB — MAGNESIUM: Magnesium: 1.6 mg/dL — ABNORMAL LOW (ref 1.7–2.4)

## 2022-05-29 LAB — CBC
HCT: 26.7 % — ABNORMAL LOW (ref 36.0–46.0)
Hemoglobin: 8.7 g/dL — ABNORMAL LOW (ref 12.0–15.0)
MCH: 32.6 pg (ref 26.0–34.0)
MCHC: 32.6 g/dL (ref 30.0–36.0)
MCV: 100 fL (ref 80.0–100.0)
Platelets: 227 10*3/uL (ref 150–400)
RBC: 2.67 MIL/uL — ABNORMAL LOW (ref 3.87–5.11)
RDW: 15.3 % (ref 11.5–15.5)
WBC: 10.4 10*3/uL (ref 4.0–10.5)
nRBC: 0 % (ref 0.0–0.2)

## 2022-05-29 LAB — COMPREHENSIVE METABOLIC PANEL
ALT: 14 U/L (ref 0–44)
AST: 12 U/L — ABNORMAL LOW (ref 15–41)
Albumin: 3 g/dL — ABNORMAL LOW (ref 3.5–5.0)
Alkaline Phosphatase: 47 U/L (ref 38–126)
Anion gap: 8 (ref 5–15)
BUN: 14 mg/dL (ref 8–23)
CO2: 25 mmol/L (ref 22–32)
Calcium: 9 mg/dL (ref 8.9–10.3)
Chloride: 95 mmol/L — ABNORMAL LOW (ref 98–111)
Creatinine, Ser: 0.69 mg/dL (ref 0.44–1.00)
GFR, Estimated: >60 mL/min (ref >60)
Glucose, Bld: 108 mg/dL — ABNORMAL HIGH (ref 70–99)
Potassium: 3.8 mmol/L (ref 3.5–5.1)
Sodium: 128 mmol/L — ABNORMAL LOW (ref 135–145)
Total Bilirubin: 1.1 mg/dL (ref 0.3–1.2)
Total Protein: 5.7 g/dL — ABNORMAL LOW (ref 6.5–8.1)

## 2022-05-29 LAB — HEPARIN LEVEL (UNFRACTIONATED)
Heparin Unfractionated: 0.29 IU/mL — ABNORMAL LOW (ref 0.30–0.70)
Heparin Unfractionated: 0.45 IU/mL (ref 0.30–0.70)

## 2022-05-29 MED ORDER — ENSURE ENLIVE PO LIQD
237.0000 mL | Freq: Two times a day (BID) | ORAL | Status: DC
  Administered 2022-05-29: 237 mL via ORAL

## 2022-05-29 MED ORDER — MAGNESIUM SULFATE 2 GM/50ML IV SOLN
2.0000 g | Freq: Once | INTRAVENOUS | Status: AC
  Administered 2022-05-29: 2 g via INTRAVENOUS
  Filled 2022-05-29: qty 50

## 2022-05-29 MED ORDER — APIXABAN 5 MG PO TABS
10.0000 mg | ORAL_TABLET | Freq: Two times a day (BID) | ORAL | Status: DC
  Administered 2022-05-29 – 2022-05-31 (×5): 10 mg via ORAL
  Filled 2022-05-29 (×5): qty 2

## 2022-05-29 MED ORDER — APIXABAN 5 MG PO TABS: 5.0000 mg | ORAL_TABLET | Freq: Two times a day (BID) | ORAL | Status: DC

## 2022-05-29 NOTE — Discharge Instructions (Signed)

## 2022-05-29 NOTE — Assessment & Plan Note (Signed)
Magnesium replaced. 

## 2022-05-29 NOTE — Progress Notes (Signed)
Progress Note   Patient: Sabrina Wilson TMH:[id number] DOB: 12/12/1940 DOA: 05/27/2022     2 DOS: the patient was seen and examined on 05/29/2022   Brief hospital course: 82 y.o. female  coming for right lower back pain. Pain is worse with deep inspiration and noted fevers today.   Chart review shows that patient was admitted on May 12, 2022 and discharged on May 17, 2022 for acute hypoxic respiratory failure demand ischemia bilateral pneumonia hypotension and severe sepsis. Pt is also finishing her last dose of paxlovid  Patient is a history of smoking and has a history of breast cancer and colon cancer and receives chemotherapy.  Patient also has history of chronic diastolic congestive heart failure.  CT angio done was negative for pulmonary embolism on 18 January.  Patient admitted on 05/27/2022 with right lower lobe pulmonary embolism and left lower extremity DVT.  Patient was started on heparin drip.  2/4.  Convert heparin drip over to Eliquis.  Replace magnesium.  Increase diet to regular diet for hyponatremia.    Assessment and Plan: * Pulmonary embolism (HCC) Switch heparin drip over to Eliquis.  Right lower lung pain likely secondary to possible pulmonary infarct.  DVT (deep venous thrombosis) (HCC) Left lower extremity-popliteal, posterior tibial and peroneal veins.  Switch heparin drip over to Eliquis.  Hyponatremia Advance diet to regular.  Discontinue hydrochlorothiazide  Hypomagnesemia 2 g IV magnesium today  Hypertension Discontinue hydrochlorothiazide with hyponatremia   Acute hypoxic respiratory failure (HCC) Pulse ox with room air on ambulation.  I do not see any low pulse ox is documented in the computer.  Anemia Check ferritin level for further classification of anemia.  Invasive lobular carcinoma of breast in female [facility]) Follow-up with Dr. Rogue Bussing as outpatient.        Subjective: Patient last night had a lot of pain in her right lung area  especially with taking deep breath.  Physical Exam: Vitals:   05/28/22 1715 05/28/22 1944 05/28/22 2316 05/29/22 0426  BP: 93/68 (!) 109/58 112/61 (!) 115/56  Pulse: 86 90 88 76  Resp: (!) '22 20 20 18  '$ Temp: 99.9 F (37.7 C) 98.3 F (36.8 C) 98.6 F (37 C) 98.5 F (36.9 C)  TempSrc: Oral  Oral Oral  SpO2: 100% 100% 100% 100%  Weight:      Height:       Physical Exam HENT:     Head: Normocephalic.     Mouth/Throat:     Pharynx: No oropharyngeal exudate.  Eyes:     General: Lids are normal.     Conjunctiva/sclera: Conjunctivae normal.  Cardiovascular:     Rate and Rhythm: Normal rate and regular rhythm.     Heart sounds: Normal heart sounds, S1 normal and S2 normal.  Pulmonary:     Breath sounds: Examination of the right-lower field reveals decreased breath sounds. Decreased breath sounds present. No wheezing, rhonchi or rales.  Abdominal:     Palpations: Abdomen is soft.     Tenderness: There is no abdominal tenderness.  Musculoskeletal:     Right lower leg: No swelling.     Left lower leg: No swelling.  Skin:    General: Skin is warm.     Findings: No rash.  Neurological:     Mental Status: She is alert and oriented to person, place, and time.     Data Reviewed: Sodium 128, creatinine 0.69, white blood cell count 10.4, hemoglobin 8.7, platelet count 227 CT scan on admission showed  positive examination for pulmonary embolism with new subsegmental embolus present in the right lower lobe.  Rounded groundglass opacity right lower lobe which could be consistent with a developing pulmonary infarct  Family Communication: Spoke with husband at the bedside  Disposition: Status is: Inpatient Remains inpatient appropriate because: Change heparin over to Eliquis.  Replace IV magnesium.  Change diet to regular.  Planned Discharge Destination: Home with Home Health    Time spent: 28 minutes  Author: Loletha Grayer, MD 05/29/2022 1:23 PM  For on call review  [email or web address].

## 2022-05-29 NOTE — Consult Note (Signed)
ANTICOAGULATION CONSULT NOTE - Initial Consult  Pharmacy Consult for Apixaban Indication: pulmonary embolus  Allergies  Allergen Reactions   Sulfa Antibiotics Hives    As a child    Patient Measurements: Height: '5\' 6"'$  (167.6 cm) Weight: 81.5 kg (179 lb 10.8 oz) IBW/kg (Calculated) : 59.3 Heparin Dosing Weight: 76.3kg  Vital Signs: Temp: 98.5 F (36.9 C) (02/04 0426) Temp Source: Oral (02/04 0426) BP: 115/56 (02/04 0426) Pulse Rate: 76 (02/04 0426)  Labs: Recent Labs    05/27/22 1852 05/27/22 1852 05/27/22 2206 05/28/22 0530 05/28/22 0925 05/28/22 1443 05/29/22 0006 05/29/22 0929  HGB 10.1*  --   --  8.5* 9.2*  --   --  8.7*  HCT 30.5*  --   --  26.8* 29.1*  --   --  26.7*  PLT 244  --   --  189 205  --   --  227  APTT  --   --  30  --   --   --   --   --   LABPROT  --   --  14.5  --   --   --   --   --   INR  --   --  1.1  --   --   --   --   --   HEPARINUNFRC  --    < >  --  >1.10*  --  0.25* 0.29* 0.45  CREATININE 0.89  --   --   --   --   --   --  0.69  TROPONINIHS 8  --   --  11  --   --   --   --    < > = values in this interval not displayed.     Estimated Creatinine Clearance: 61.4 mL/min (by C-G formula based on SCr of 0.69 mg/dL).  Medical History: Past Medical History:  Diagnosis Date   Actinic keratosis 02/12/2020   R lat calf (hypertrophic)   Arthritis    Cancer of sigmoid ([place]) 06/17/2016   Colon cancer ([place]) 2009   T3,N0; s/p resection  Dr. Hulda Humphrey and chemotherapy by Dr. Cynda Acres   Diastolic dysfunction    a. 08/2020 Echo: EF 60-65%, no rwma, Gr1 DD, nl RV size/fxn. Mild MR.   Hernia of flank    History of actinic keratoses 08/11/2020   Bx proven at left lateral ankle proximal, LN2 10/08/20   History of stress test    a. 09/2020 MV: EF >65%. No ischemia/infarct. Mild Ao Ca2+ and minimal Cor Ca2+.   Hypertension    Neuropathy    Polyp at cervical os    s/p resection Dr. Sabra Heck   Skin cancer 01/22/2020   surface of an atypical verrucous  squamous proliferation    Squamous cell carcinoma of skin 07/16/2020   Left lateral ankle anterior, treated with [facility] 08-11-2020    Medications:  Scheduled:   atorvastatin  10 mg Oral Daily   hydrochlorothiazide  12.5 mg Oral Daily   lidocaine  1 patch Transdermal Q24H   pantoprazole (PROTONIX) IV  40 mg Intravenous Q12H   sodium chloride flush  3 mL Intravenous Q12H   umeclidinium bromide  1 puff Inhalation Daily   Infusions:   heparin 1,100 Units/hr (05/29/22 0442)   magnesium sulfate bolus IVPB     PRN: acetaminophen **OR** acetaminophen, albuterol, guaiFENesin, hydrALAZINE, HYDROcodone-acetaminophen, morphine injection, morphine injection, ondansetron **OR** ondansetron (ZOFRAN) IV, mouth rinse  Assessment: Sabrina Wilson is a 82 y.o.  female presenting with PE and LLE DVT. PMH significant for hx of colon cancer and breast cancer (recent chemotherapy), GERD, HTN, HLD. Patient was not on [facility] PTA per chart review. Patient was started on heparin infusion for PE and DVT and will transition Pharmacy has been consulted to dose apixaban.   Plan:  Discontinue heparin infusion Start apixaban 10 mg BID x7d then 5 mg BID thereafter Pharmacy will sign off and continue to monitor peripherally  [name], PharmD PGY1 Pharmacy Resident 05/29/2022 10:46 AM

## 2022-05-29 NOTE — Evaluation (Signed)
Occupational Therapy Evaluation Patient Details Name: Sabrina Wilson MRN: [id number] DOB: 01/20/1941 Today's Date: 05/29/2022   History of Present Illness 82 y.o. female with medical history of HTN, neuropathy, arthritis, colon cancer, squamous cell carcinoma of the skin, diastolic dysfunction. She presented to the ED due to lower back pain which is worse with deep inhalation; she also had a fever on the day of presentation. She was admitted 05/12/22-05/17/22 due to acute hypoxic respiratory failure, demand ischemia, bilateral pneumonia, hypotension, and severe sepsis. Pt found to have acute PE (heparin started on 05/27/22 @ 2223).   Clinical Impression   MD cleared pt for participation in therapy. Patient seen for OT evaluation, spouse present. Pt currently functioning at Mod I for bed mobility, supervision for functional mobility to the bathroom without an AD, Mod I for toilet transfer, supervision for peri care/clothing management, and supervision for sinkside grooming tasks. Pt is close to baseline level of function with ADLs, however, OT will continue to follow pt while in hospital to prevent further decline and work on implementing energy conservation techniques during self-care tasks. No follow up OT recommended at D/C.       Recommendations for follow up therapy are one component of a multi-disciplinary discharge planning process, led by the attending physician.  Recommendations may be updated based on patient status, additional functional criteria and insurance authorization.   Follow Up Recommendations  No OT follow up     Assistance Recommended at Discharge Set up Supervision/Assistance  Patient can return home with the following A little help with walking and/or transfers;A little help with bathing/dressing/bathroom;Assistance with cooking/housework;Assist for transportation;Help with stairs or ramp for entrance    Functional Status Assessment  Patient has had a recent decline in  their functional status and demonstrates the ability to make significant improvements in function in a reasonable and predictable amount of time.  Equipment Recommendations  None recommended by OT    Recommendations for Other Services       Precautions / Restrictions Precautions Precautions: Fall Restrictions Weight Bearing Restrictions: No      Mobility Bed Mobility Overal bed mobility: Modified Independent                  Transfers Overall transfer level: Modified independent Equipment used: None               General transfer comment: STS from EOB and toilet      Balance Overall balance assessment: Mild deficits observed, not formally tested                                         ADL either performed or assessed with clinical judgement   ADL Overall ADL's : Needs assistance/impaired Eating/Feeding: Modified independent   Grooming: Supervision/safety;Standing;Wash/dry hands           Upper Body Dressing : Set up;Sitting   Lower Body Dressing: Set up;Supervision/safety;Sit to/from stand   Toilet Transfer: Modified Independent;Regular Toilet;Grab bars   Toileting- Water quality scientist and Hygiene: Supervision/safety;Sit to/from stand       Functional mobility during ADLs: Supervision/safety (to the bathroom, no AD, pt deferred use of RW)       Vision Baseline Vision/History: 1 Wears glasses Patient Visual Report: No change from baseline       Perception     Praxis      Pertinent Vitals/Pain Pain Assessment Pain Assessment: No/denies pain  Hand Dominance     Extremity/Trunk Assessment Upper Extremity Assessment Upper Extremity Assessment: Overall WFL for tasks assessed (pt endorses neuropathy at baseline 2/2 chemo)   Lower Extremity Assessment Lower Extremity Assessment: Defer to PT evaluation (pt endorses neuropathy at baseline 2/2 chemo)   Cervical / Trunk Assessment Cervical / Trunk Assessment:  Normal   Communication Communication Communication: No difficulties   Cognition Arousal/Alertness: Awake/alert Behavior During Therapy: WFL for tasks assessed/performed Overall Cognitive Status: Within Functional Limits for tasks assessed                                       General Comments       Exercises Other Exercises Other Exercises: OT provided education re: role of OT, OT POC, post acute recs, sitting up for all meals, EOB/OOB mobility with assistance, home/fall safety, energy conservation techniques, discussed use of BSC over toilet at home 2/2 having standard toilet height but pt deferred   Shoulder Instructions      Home Living Family/patient expects to be discharged to:: Private residence Living Arrangements: Spouse/significant other Available Help at Discharge: Family;Available 24 hours/day Type of Home: House Home Access: Level entry     Home Layout: One level     Bathroom Shower/Tub: Teacher, early years/pre: Standard     Home Equipment: Shower seat;Grab bars - tub/shower;Hand held shower head          Prior Functioning/Environment Prior Level of Function : Independent/Modified Independent;Driving               ADLs Comments: Normally IND for ADLs/IADLs, Mod I (increased time) for self-care tasks recently 2/2 recent health issues        OT Problem List: Decreased activity tolerance;Impaired balance (sitting and/or standing);Decreased strength;Impaired sensation      OT Treatment/Interventions: Self-care/ADL training;Therapeutic exercise;Therapeutic activities;Energy conservation;DME and/or AE instruction;Patient/family education;Balance training    OT Goals(Current goals can be found in the care plan section) Acute Rehab OT Goals Patient Stated Goal: return home OT Goal Formulation: With patient/family Time For Goal Achievement: 06/12/22 Potential to Achieve Goals: Good   OT Frequency: Min 2X/week     Co-evaluation              AM-PAC OT "6 Clicks" Daily Activity     Outcome Measure Help from another person eating meals?: None Help from another person taking care of personal grooming?: None Help from another person toileting, which includes using toliet, bedpan, or urinal?: A Little Help from another person bathing (including washing, rinsing, drying)?: A Little Help from another person to put on and taking off regular upper body clothing?: None Help from another person to put on and taking off regular lower body clothing?: A Little 6 Click Score: 21   End of Session Nurse Communication: Mobility status  Activity Tolerance: Patient tolerated treatment well Patient left: in bed;with call bell/phone within reach;with family/visitor present;with bed alarm set  OT Visit Diagnosis: Unsteadiness on feet (R26.81);Muscle weakness (generalized) (M62.81)                Time: 1040-1057 OT Time Calculation (min): 17 min Charges:  OT General Charges $OT Visit: 1 Visit OT Evaluation $OT Eval Low Complexity: 1 Low  [facility] MS, OTR/L ascom [phone]  05/29/22, 12:41 PM

## 2022-05-29 NOTE — Assessment & Plan Note (Signed)
Follow-up with Dr. Rogue Bussing as outpatient.

## 2022-05-29 NOTE — Progress Notes (Signed)
Initial Nutrition Assessment RD working remotely.   DOCUMENTATION CODES:   Not applicable  INTERVENTION:  - ordered Ensure Plus High Protein po BID, each supplement provides 350 kcal and 20 grams of protein.  - complete NFPE when feasible.  - entered High Calorie, High Protein Nutrition Therapy handout from the [employer] into the AVS.   NUTRITION DIAGNOSIS:   Increased nutrient needs related to acute illness, chronic illness as evidenced by estimated needs.  GOAL:   Patient will meet greater than or equal to 90% of their needs  MONITOR:   PO intake, Supplement acceptance, Labs, Weight trends  REASON FOR ASSESSMENT:   Consult Assessment of nutrition requirement/status  ASSESSMENT:   82 y.o. female with medical history of HTN, neuropathy, arthritis, colon cancer, squamous cell carcinoma of the skin, diastolic dysfunction. She presented to the ED due to lower back pain which is worse with deep inhalation; she also had a fever on the day of presentation. She was admitted 05/12/22-05/17/22 due to acute hypoxic respiratory failure, demand ischemia, bilateral pneumonia, hypotension, and severe sepsis. Pt is also finishing her last dose of Paxlovid.  Patient has not been assessed by a [place] RD at any time in the past.   Diet liberalized from Heart Healthy to Regular today at 1028. No meal completion percentages documented in the flow sheet this admission.  Weight on 2/2 was 180 lb and weight has been stable over the past 1 month. Weight on 04/21/22 was 184 lb and weight on 03/07/22 was 188 lb. This indicates 4 lb / 2.2% weight loss in the past 2 months and 8 lb / 4.3% weight los in the past 3 months; both not significant for time frame.   Per notes: - pulmonary embolism - acute hypoxic respiratory failure - Vascular Surgery consulted--no plan for PE or IVC filter placement   Labs reviewed; Na: 128 mmol/l, Cl: 95 mmol/l, Mg: 1.6 mg/dl. Medications  reviewed; 12.5 mg hydrodiuril/day, 2 g IV Mg sulfate x1 run 2/4, 40 mg IV protonix BID.    NUTRITION - FOCUSED PHYSICAL EXAM:  RD working remotely.  Diet Order:   Diet Order             Diet regular Fluid consistency: Thin  Diet effective now                   EDUCATION NEEDS:   Education needs have been addressed  Skin:  Skin Assessment: Reviewed RN Assessment  Last BM:  PTA/unknown  Height:   Ht Readings from Last 1 Encounters:  05/27/22 '5\' 6"'$  (1.676 m)    Weight:   Wt Readings from Last 1 Encounters:  05/27/22 81.5 kg     BMI:  Body mass index is 29 kg/m.  Estimated Nutritional Needs:  Kcal:  1850-2050 kcal Protein:  100-115 grams Fluid:  >/= 2 L/day     Jarome Matin, MS, RD, LDN, CNSC Clinical Dietitian PRN/Relief staff On-call/weekend pager # available in [facility]

## 2022-05-29 NOTE — Assessment & Plan Note (Addendum)
Left lower extremity-popliteal, posterior tibial and peroneal veins.  Switch heparin drip over to Eliquis.

## 2022-05-29 NOTE — Evaluation (Signed)
Physical Therapy Evaluation Patient Details Name: Sabrina Wilson MRN: [id number] DOB: 1940/06/15 Today's Date: 05/29/2022  History of Present Illness  Sabrina Wilson is a 33yoF who [facility] on [facility]22 c Rt low back pain x 1 day, fever. PMH: remote colon CA, BrCA, recent completion of chemotherapy, recent admission with COVID PNA (1/18). RVP negative for RSV, COVID. Workup revealing of acute PE initiated on heparin therapy, 'non-occlusive thrombus within the left popliteal, posterior tibial  and peroneal veins.'  Clinical Impression  Pt in bed agreeable to session, reports feeling somewhat improved. Pt has been AMB to BR ad lib without concerning changes in acute balance/strength, dyspnea is improving. Pt performs modified independent bed mobility and transfers, steady seated EOB. Pt demonstrates 2 STS transfers and AMB to doorway and back twice (sit break between these) on room air, sats at 90 and 89% post AMB respectively. Pt has baseline neuropathy and uses hand on external supports in home at baseline, does this here as well. PT would benefit from use of RW for balance and PT services to improve balance. Pt left seated EOB with MD in room. AMB remains most limited by DOE, not drastically different from recent baseline, but also not especially correlated with desaturation. Will continue to follow.      Recommendations for follow up therapy are one component of a multi-disciplinary discharge planning process, led by the attending physician.  Recommendations may be updated based on patient status, additional functional criteria and insurance authorization.  Follow Up Recommendations Home health PT      Assistance Recommended at Discharge Intermittent Supervision/Assistance  Patient can return home with the following  Help with stairs or ramp for entrance;Assist for transportation;Assistance with cooking/housework    Equipment Recommendations Rolling walker (2 wheels)  Recommendations for  Other Services       Functional Status Assessment Patient has had a recent decline in their functional status and demonstrates the ability to make significant improvements in function in a reasonable and predictable amount of time.     Precautions / Restrictions Precautions Precautions: Fall Restrictions Weight Bearing Restrictions: No      Mobility  Bed Mobility Overal bed mobility: Modified Independent                  Transfers Overall transfer level: Modified independent Equipment used: None                    Ambulation/Gait   Gait Distance (Feet): 50 Feet Assistive device: None Gait Pattern/deviations: Step-to pattern       General Gait Details: limited by DOE, 90% on room air AMB to door and back; (twice with a sit break between)  Stairs            Wheelchair Mobility    Modified Rankin (Stroke Patients Only)       Balance Overall balance assessment: Mild deficits observed, not formally tested                                           Pertinent Vitals/Pain      Home Living Family/patient expects to be discharged to:: Private residence Living Arrangements: Spouse/significant other Available Help at Discharge: Family;Available 24 hours/day Type of Home: House Home Access: Level entry       Home Layout: One level Home Equipment: Shower seat;Grab bars - tub/shower;Hand held shower head  Prior Function Prior Level of Function : Independent/Modified Independent;Driving               ADLs Comments: Normally IND for ADLs/IADLs, Mod I (increased time) for self-care tasks recently 2/2 recent health issues     Hand Dominance        Extremity/Trunk Assessment   Upper Extremity Assessment Upper Extremity Assessment: Overall WFL for tasks assessed (pt endorses neuropathy at baseline 2/2 chemo)    Lower Extremity Assessment Lower Extremity Assessment: Defer to PT evaluation (pt endorses neuropathy  at baseline 2/2 chemo)    Cervical / Trunk Assessment Cervical / Trunk Assessment: Normal  Communication   Communication: No difficulties  Cognition Arousal/Alertness: Awake/alert Behavior During Therapy: WFL for tasks assessed/performed Overall Cognitive Status: Within Functional Limits for tasks assessed                                          General Comments      Exercises     Assessment/Plan    PT Assessment Patient needs continued PT services  PT Problem List Decreased strength;Decreased activity tolerance;Decreased mobility;Decreased balance;Decreased safety awareness;Decreased knowledge of precautions       PT Treatment Interventions DME instruction;Gait training;Stair training;Functional mobility training;Therapeutic activities;Therapeutic exercise;Balance training;Patient/family education    PT Goals (Current goals can be found in the Care Plan section)  Acute Rehab PT Goals Patient Stated Goal: return to home and recovery strength/stamina issues PT Goal Formulation: With patient Time For Goal Achievement: 06/12/22 Potential to Achieve Goals: Good    Frequency Min 2X/week     Co-evaluation               AM-PAC PT "6 Clicks" Mobility  Outcome Measure Help needed turning from your back to your side while in a flat bed without using bedrails?: None Help needed moving from lying on your back to sitting on the side of a flat bed without using bedrails?: None Help needed moving to and from a bed to a chair (including a wheelchair)?: None Help needed standing up from a chair using your arms (e.g., wheelchair or bedside chair)?: None Help needed to walk in hospital room?: A Little Help needed climbing 3-5 steps with a railing? : A Little 6 Click Score: 22    End of Session Equipment Utilized During Treatment: Gait belt Activity Tolerance: Patient tolerated treatment well;No increased pain;Patient limited by fatigue Patient left: in  bed;with family/visitor present;with nursing/sitter in room Nurse Communication: Mobility status PT Visit Diagnosis: Difficulty in walking, not elsewhere classified (R26.2);Other abnormalities of gait and mobility (R26.89);Muscle weakness (generalized) (M62.81)    Time: 7902-4097 PT Time Calculation (min) (ACUTE ONLY): 21 min   Charges:   PT Evaluation $PT Eval High Complexity: 1 High         1:06 PM, 05/29/22 Etta Grandchild, PT, DPT Physical Therapist - [facility]  [phone] ([place])    [place] C 05/29/2022, 12:57 PM

## 2022-05-29 NOTE — Consult Note (Signed)
ANTICOAGULATION CONSULT NOTE - Initial Consult  Pharmacy Consult for Heparin infusion Indication: pulmonary embolus  Allergies  Allergen Reactions   Sulfa Antibiotics Hives    As a child    Patient Measurements: Height: '5\' 6"'$  (167.6 cm) Weight: 81.5 kg (179 lb 10.8 oz) IBW/kg (Calculated) : 59.3 Heparin Dosing Weight: 76.3kg  Vital Signs: Temp: 98.6 F (37 C) (02/03 2316) Temp Source: Oral (02/03 2316) BP: 112/61 (02/03 2316) Pulse Rate: 88 (02/03 2316)  Labs: Recent Labs    05/27/22 1852 05/27/22 2206 05/28/22 0530 05/28/22 0925 05/28/22 1443 05/29/22 0006  HGB 10.1*  --  8.5* 9.2*  --   --   HCT 30.5*  --  26.8* 29.1*  --   --   PLT 244  --  189 205  --   --   APTT  --  30  --   --   --   --   LABPROT  --  14.5  --   --   --   --   INR  --  1.1  --   --   --   --   HEPARINUNFRC  --   --  >1.10*  --  0.25* 0.29*  CREATININE 0.89  --   --   --   --   --   TROPONINIHS 8  --  11  --   --   --      Estimated Creatinine Clearance: 55.2 mL/min (by C-G formula based on SCr of 0.89 mg/dL).  Medical History: Past Medical History:  Diagnosis Date   Actinic keratosis 02/12/2020   R lat calf (hypertrophic)   Arthritis    Cancer of sigmoid ([place]) 06/17/2016   Colon cancer ([place]) 2009   T3,N0; s/p resection  Dr. Hulda Humphrey and chemotherapy by Dr. Cynda Acres   Diastolic dysfunction    a. 08/2020 Echo: EF 60-65%, no rwma, Gr1 DD, nl RV size/fxn. Mild MR.   Hernia of flank    History of actinic keratoses 08/11/2020   Bx proven at left lateral ankle proximal, LN2 10/08/20   History of stress test    a. 09/2020 MV: EF >65%. No ischemia/infarct. Mild Ao Ca2+ and minimal Cor Ca2+.   Hypertension    Neuropathy    Polyp at cervical os    s/p resection Dr. Sabra Heck   Skin cancer 01/22/2020   surface of an atypical verrucous squamous proliferation    Squamous cell carcinoma of skin 07/16/2020   Left lateral ankle anterior, treated with [facility] 08-11-2020    Medications:  No AC prior to  admission.   *Hgb and PLT appropriate to start infusion* Baseline INR 1.1 and aPTT 30  Assessment: PMH includes  hx of colon cancer and breast cancer, recent chemotherapy, COVID pneumonia admission 1/18. Pharmacy consulted to manage heparin infusion for new dx PE.  2/3 0530 HL > 1.10, Supratherapeutic 2/3 1443 HL = 0.25, Subtherapeutic 2/4 0006 HL 0.29, subtherapeutic   Goal of Therapy:  Heparin level 0.3-0.5 units/ml Monitor platelets by anticoagulation protocol: Yes   Plan:  Heparin subratherapeutic. Increase heparin infusion rate to 1100u/hr.  Check anti-Xa level in 8 hours following rate change Continue to monitor H&H and platelets  Dorothe Pea, PharmD, BCPS Clinical Pharmacist   05/29/2022 12:46 AM

## 2022-05-29 NOTE — Assessment & Plan Note (Addendum)
Advance diet to regular.  Discontinue hydrochlorothiazide.  Sodium up to 131

## 2022-05-30 ENCOUNTER — Inpatient Hospital Stay: Payer: Medicare Other

## 2022-05-30 ENCOUNTER — Ambulatory Visit: Payer: Medicare Other

## 2022-05-30 ENCOUNTER — Telehealth (HOSPITAL_COMMUNITY): Payer: Self-pay

## 2022-05-30 ENCOUNTER — Encounter: Payer: Self-pay | Admitting: Internal Medicine

## 2022-05-30 ENCOUNTER — Inpatient Hospital Stay: Payer: Medicare Other | Admitting: Internal Medicine

## 2022-05-30 ENCOUNTER — Other Ambulatory Visit (HOSPITAL_COMMUNITY): Payer: Self-pay

## 2022-05-30 DIAGNOSIS — J45901 Unspecified asthma with (acute) exacerbation: Secondary | ICD-10-CM

## 2022-05-30 DIAGNOSIS — D638 Anemia in other chronic diseases classified elsewhere: Secondary | ICD-10-CM | POA: Insufficient documentation

## 2022-05-30 DIAGNOSIS — I2699 Other pulmonary embolism without acute cor pulmonale: Secondary | ICD-10-CM | POA: Diagnosis not present

## 2022-05-30 DIAGNOSIS — I82402 Acute embolism and thrombosis of unspecified deep veins of left lower extremity: Secondary | ICD-10-CM | POA: Diagnosis not present

## 2022-05-30 DIAGNOSIS — J4521 Mild intermittent asthma with (acute) exacerbation: Secondary | ICD-10-CM

## 2022-05-30 DIAGNOSIS — E876 Hypokalemia: Secondary | ICD-10-CM

## 2022-05-30 LAB — BASIC METABOLIC PANEL
Anion gap: 8 (ref 5–15)
BUN: 11 mg/dL (ref 8–23)
CO2: 27 mmol/L (ref 22–32)
Calcium: 9 mg/dL (ref 8.9–10.3)
Chloride: 96 mmol/L — ABNORMAL LOW (ref 98–111)
Creatinine, Ser: 0.66 mg/dL (ref 0.44–1.00)
GFR, Estimated: >60 mL/min (ref >60)
Glucose, Bld: 121 mg/dL — ABNORMAL HIGH (ref 70–99)
Potassium: 3.4 mmol/L — ABNORMAL LOW (ref 3.5–5.1)
Sodium: 131 mmol/L — ABNORMAL LOW (ref 135–145)

## 2022-05-30 LAB — CBC
HCT: 26 % — ABNORMAL LOW (ref 36.0–46.0)
Hemoglobin: 8.5 g/dL — ABNORMAL LOW (ref 12.0–15.0)
MCH: 32.6 pg (ref 26.0–34.0)
MCHC: 32.7 g/dL (ref 30.0–36.0)
MCV: 99.6 fL (ref 80.0–100.0)
Platelets: 234 10*3/uL (ref 150–400)
RBC: 2.61 MIL/uL — ABNORMAL LOW (ref 3.87–5.11)
RDW: 14.9 % (ref 11.5–15.5)
WBC: 7.9 10*3/uL (ref 4.0–10.5)
nRBC: 0 % (ref 0.0–0.2)

## 2022-05-30 LAB — MAGNESIUM: Magnesium: 2 mg/dL (ref 1.7–2.4)

## 2022-05-30 LAB — FERRITIN: Ferritin: 789 ng/mL — ABNORMAL HIGH (ref 11–307)

## 2022-05-30 MED ORDER — HYDROCODONE-ACETAMINOPHEN 5-325 MG PO TABS: 1.0000 | ORAL_TABLET | ORAL | Status: DC | PRN

## 2022-05-30 MED ORDER — ALBUTEROL SULFATE (2.5 MG/3ML) 0.083% IN NEBU
2.5000 mg | INHALATION_SOLUTION | Freq: Two times a day (BID) | RESPIRATORY_TRACT | Status: DC
  Administered 2022-05-30 – 2022-05-31 (×2): 2.5 mg via RESPIRATORY_TRACT
  Filled 2022-05-30 (×2): qty 3

## 2022-05-30 MED ORDER — ALBUTEROL SULFATE (2.5 MG/3ML) 0.083% IN NEBU
2.5000 mg | INHALATION_SOLUTION | Freq: Four times a day (QID) | RESPIRATORY_TRACT | Status: DC
  Administered 2022-05-30: 2.5 mg via RESPIRATORY_TRACT
  Filled 2022-05-30: qty 3

## 2022-05-30 MED ORDER — PIPERACILLIN-TAZOBACTAM 3.375 G IVPB
3.3750 g | Freq: Three times a day (TID) | INTRAVENOUS | Status: DC
  Administered 2022-05-30 – 2022-05-31 (×2): 3.375 g via INTRAVENOUS
  Filled 2022-05-30 (×2): qty 50

## 2022-05-30 MED ORDER — METHYLPREDNISOLONE SODIUM SUCC 40 MG IJ SOLR
40.0000 mg | Freq: Every day | INTRAMUSCULAR | Status: DC
  Administered 2022-05-30 – 2022-05-31 (×2): 40 mg via INTRAVENOUS
  Filled 2022-05-30 (×2): qty 1

## 2022-05-30 MED ORDER — POTASSIUM CHLORIDE CRYS ER 20 MEQ PO TBCR
40.0000 meq | EXTENDED_RELEASE_TABLET | Freq: Once | ORAL | Status: AC
  Administered 2022-05-30: 40 meq via ORAL
  Filled 2022-05-30: qty 2

## 2022-05-30 NOTE — Progress Notes (Signed)
Pharmacy Antibiotic Note  Sabrina Wilson is a 82 y.o. female admitted on 05/27/2022 with pneumonia.  Pharmacy has been consulted for Zosyn dosing.  Plan: Zosyn 3.375g IV q8h (4 hour infusion).  Height: '5\' 6"'$  (167.6 cm) Weight: 81.5 kg (179 lb 10.8 oz) IBW/kg (Calculated) : 59.3  Temp (24hrs), Avg:99.3 F (37.4 C), Min:98.8 F (37.1 C), Max:100 F (37.8 C)  Recent Labs  Lab 05/27/22 1852 05/28/22 0530 05/28/22 0925 05/29/22 0929 05/30/22 0606  WBC 15.9* 11.4* 12.0* 10.4 7.9  CREATININE 0.89  --   --  0.69 0.66    Estimated Creatinine Clearance: 61.4 mL/min (by C-G formula based on SCr of 0.66 mg/dL).    Allergies  Allergen Reactions   Sulfa Antibiotics Hives    As a child    Antimicrobials this admission: Zosyn 2/5 >>     Dose adjustments this admission:  Microbiology results:  Thank you for allowing pharmacy to be a part of this patient's care.  Paulina Fusi, PharmD, BCPS 05/30/2022 5:45 PM

## 2022-05-30 NOTE — Progress Notes (Signed)
Physical Therapy Treatment Patient Details Name: Sabrina Wilson MRN: [id number] DOB: 11-21-1940 Today's Date: 05/30/2022   History of Present Illness Carlton Sweaney is a 67yoF who [facility] on 05/27/22 c Rt low back pain x 1 day, fever. PMH: remote colon CA, BrCA, recent completion of chemotherapy, recent admission with COVID PNA (1/18). RVP negative for RSV, COVID. Workup revealing of acute PE initiated on heparin therapy, 'non-occlusive thrombus within the left popliteal, posterior tibial  and peroneal veins.'    PT Comments    Pt received supine in bed agreeable to PT services. Endorses breathing has improved. SPO2 at 95% at rest prior to mobility. Pt is mod-I from supine in bed to standing at bedside. She is reliant initially on minguard then supervision ambulating around nurses station. Pt does remain needing SUE support on support surfaces throughout bout but is generally steady in instances where there is no sturdy placement for SUE support throughout bout. Pt does demo increased WOB and accessory muscle use requesting return to room. Pt at 91% sitting EoB post gait. Encouraged pt on frequent OOB mobility to pt tolerance with nursing staff and recommended using mobility specialists here at hospital. Pt in agreement. RT arriving to pt's room upon PT exit. Will continue to rec [facility] PT at discharge to improve pt's stamina and endurance to optimize return to PLOF.    Recommendations for follow up therapy are one component of a multi-disciplinary discharge planning process, led by the attending physician.  Recommendations may be updated based on patient status, additional functional criteria and insurance authorization.  Follow Up Recommendations  Home health PT     Assistance Recommended at Discharge Intermittent Supervision/Assistance  Patient can return home with the following Help with stairs or ramp for entrance;Assist for transportation;Assistance with cooking/housework   Equipment  Recommendations       Recommendations for Other Services       Precautions / Restrictions Precautions Precautions: Fall Restrictions Weight Bearing Restrictions: No     Mobility  Bed Mobility Overal bed mobility: Modified Independent               Patient Response: Cooperative  Transfers Overall transfer level: Modified independent                      Ambulation/Gait Ambulation/Gait assistance: Supervision, Min guard Gait Distance (Feet): 200 Feet Assistive device: None Gait Pattern/deviations: Step-through pattern, Decreased step length - right, Decreased step length - left       General Gait Details: Performing lap around nurses station with very light UE support on hallway railing throughout.   Stairs             Wheelchair Mobility    Modified Rankin (Stroke Patients Only)       Balance Overall balance assessment: Mild deficits observed, not formally tested                                          Cognition   Behavior During Therapy: [facility] for tasks assessed/performed Overall Cognitive Status: Within Functional Limits for tasks assessed                                          Exercises      General Comments General comments (  skin integrity, edema, etc.): SPo2 >90% on RA pre and post mobility      Pertinent Vitals/Pain Pain Assessment Pain Assessment: No/denies pain    Home Living                          Prior Function            PT Goals (current goals can now be found in the care plan section) Acute Rehab PT Goals Patient Stated Goal: return to home and recovery strength/stamina issues PT Goal Formulation: With patient Time For Goal Achievement: 06/12/22 Potential to Achieve Goals: Good Progress towards PT goals: Progressing toward goals    Frequency    Min 2X/week      PT Plan Current plan remains appropriate    Co-evaluation              AM-PAC  PT "6 Clicks" Mobility   Outcome Measure  Help needed turning from your back to your side while in a flat bed without using bedrails?: None Help needed moving from lying on your back to sitting on the side of a flat bed without using bedrails?: None Help needed moving to and from a bed to a chair (including a wheelchair)?: None Help needed standing up from a chair using your arms (e.g., wheelchair or bedside chair)?: None Help needed to walk in hospital room?: A Little Help needed climbing 3-5 steps with a railing? : A Little 6 Click Score: 22    End of Session Equipment Utilized During Treatment: Gait belt Activity Tolerance: Patient tolerated treatment well Patient left:  (seated EOB in care of respiratory.) Nurse Communication: Mobility status PT Visit Diagnosis: Difficulty in walking, not elsewhere classified (R26.2);Other abnormalities of gait and mobility (R26.89);Muscle weakness (generalized) (M62.81)     Time: 9191-6606 PT Time Calculation (min) (ACUTE ONLY): 12 min  Charges:  $Therapeutic Exercise: 8-22 mins                    Onesimo Lingard M. Fairly IV, PT, DPT Physical Therapist- [facility]  05/30/2022, 12:29 PM

## 2022-05-30 NOTE — Care Management Important Message (Signed)
Important Message  Patient Details  Name: Sabrina Wilson MRN: [id number] Date of Birth: April 02, 1941   Medicare Important Message Given:  Yes     Dannette Barbara 05/30/2022, 3:37 PM

## 2022-05-30 NOTE — TOC Initial Note (Signed)
Transition of Care [facility]) - Initial/Assessment Note    Patient Details  Name: Sabrina Wilson MRN: [id number] Date of Birth: [birth year]  Transition of Care [facility]) CM/SW Contact:    Candie Chroman, LCSW Phone Number: 05/30/2022, 1:48 PM  Clinical Narrative:  CSW met with patient. Family at bedside. CSW introduced role and explained that PT recommendations would be discussed. Patient is agreeable to home health. Reviewed CMS scores, no agency preference. [facility] and [facility] are unable to accept. [facility] is reviewing. DME recommendation for RW but patient declined. No further concerns. CSW encouraged patient to contact CSW as needed. CSW will continue to follow patient for support and facilitate return home once stable.                Expected Discharge Plan: [employer] Barriers to Discharge: Continued Medical Work up   Patient Goals and CMS Choice   CMS Medicare.gov Compare Post Acute Care list provided to:: Patient        Expected Discharge Plan and Services     Post Acute Care Choice: [place] arrangements for the past 2 months: [place]                                      Prior Living Arrangements/Services Living arrangements for the past 2 months: Single Family Home Lives with:: Spouse Patient language and need for interpreter reviewed:: Yes Do you feel safe going back to the place where you live?: Yes      Need for Family Participation in Patient Care: Yes (Comment) Care giver support system in place?: Yes (comment) Current home services: DME Criminal Activity/Legal Involvement Pertinent to Current Situation/Hospitalization: No - Comment as needed  Activities of Daily Living Home Assistive Devices/Equipment: None ADL Screening (condition at time of admission) Patient's cognitive ability adequate to safely complete daily activities?: Yes Is the patient deaf or have difficulty hearing?: No Does the patient have  difficulty seeing, even when wearing glasses/contacts?: No Does the patient have difficulty concentrating, remembering, or making decisions?: No Patient able to express need for assistance with ADLs?: No Does the patient have difficulty dressing or bathing?: No Independently performs ADLs?: Yes (appropriate for developmental age) Does the patient have difficulty walking or climbing stairs?: No Weakness of Legs: None Weakness of Arms/Hands: None  Permission Sought/Granted Permission sought to share information with : Facility Art therapist granted to share information with : Yes, Verbal Permission Granted  Share Information with NAME: Oriyah Lamphear  Permission granted to share info w AGENCY: [place] granted to share info w Relationship: Husband  Permission granted to share info w Contact Information: [phone]  Emotional Assessment Appearance:: Appears stated age Attitude/Demeanor/Rapport: Engaged Affect (typically observed): Accepting, Appropriate, Calm Orientation: : Oriented to Self, Oriented to Place, Oriented to  Time, Oriented to Situation Alcohol / Substance Use: Not Applicable Psych Involvement: No (comment)  Admission diagnosis:  Pulmonary embolism ([place]) [I26.99] Single subsegmental pulmonary embolism without acute cor pulmonale (HCC) [I26.93] Patient Active Problem List   Diagnosis Date Noted   Asthmatic bronchitis with exacerbation 05/30/2022   Anemia of chronic disease 05/30/2022   Hypokalemia 05/30/2022   DVT (deep venous thrombosis) ([place]) 05/29/2022   Hypomagnesemia 05/29/2022   Hyponatremia 05/29/2022   Pulmonary embolism ([place]) 05/27/2022   Bilateral pneumonia 05/13/2022   Severe sepsis ([place]) 05/13/2022   Acute hypoxic respiratory failure (  [place]) 05/12/2022   Demand ischemia 05/12/2022   Carcinoma of overlapping sites of right breast in female, estrogen receptor positive ([place]) 02/14/2022   Invasive lobular carcinoma of  breast in female ([place]) 01/12/2022   Breast lesion 12/22/2021   Squamous cell carcinoma of skin 11/17/2020   Chronic bilateral low back pain without sciatica 12/14/2018   Varicose veins of bilateral lower extremities with other complications 61/60/7371   Hyperlipidemia 05/02/2016   Prediabetes 05/02/2016   Multiple nevi 04/06/2016   Allergic rhinitis 04/06/2016   Neuropathy 04/06/2016   Lipoma of neck 09/27/2013   Personal history of colon cancer 11/11/2011   Hypertension 12/18/2010   PCP:  Leone Haven, MD Pharmacy:   [id number] - [address], [place] [address] [place] [address] Phone: [phone] Fax: [phone]     Social Determinants of Health (SDOH) Social History: [place]: No Food Insecurity (05/28/2022)  Housing: Low Risk  (05/28/2022)  Transportation Needs: No Transportation Needs (05/28/2022)  Utilities: Not At Risk (05/28/2022)  Depression (PHQ2-9): Low Risk  (02/11/2022)  Financial Resource Strain: Low Risk  (05/19/2022)  Stress: No Stress Concern Present (11/16/2020)  Tobacco Use: Medium Risk (05/27/2022)   SDOH Interventions:     Readmission Risk Interventions     No data to display

## 2022-05-30 NOTE — Assessment & Plan Note (Addendum)
Today with wheezing in the lungs and congestion.  Start Solu-Medrol and standing dose albuterol nebulizers.  Continue her usual Incruse Ellipta inhaler.  Will repeat a chest x-ray.

## 2022-05-30 NOTE — Progress Notes (Signed)
Progress Note    05/30/2022 8:46 AM * No surgery found *  Subjective:  Sabrina Wilson is a 82 y.o. (11/07/1940) female w/ known colon, skin cancer, and most recently breast cancer who presents with cc: R chest pain.  Pt was recently admitted with B PNA last month.  Pt denies any fever or chills.  Pt denies any hemoptysis.  Pt notes continued SOB.  Pt denies any melena or hematochezia.  Pt also notes swelling in BOTH legs over the last few weeks.     This morning I find the patient resting comfortably in bed with her husband at the bedside.  No complaints overnight other that when she takes either a deep breath or coughs she has right-sided rib pain.  Vitals all remained stable.   Vitals:   05/29/22 2336 05/30/22 0532  BP: 116/64 (!) 116/57  Pulse: 83 73  Resp: 18 20  Temp: 100 F (37.8 C) 99.2 F (37.3 C)  SpO2: (!) 89% 90%   Physical Exam: Cardiac:  RRR Lungs:  Non labored with a productive cough. Pain in deep breath or couch due to pulmonary embolism.  Incisions:  None Extremities:  Bilateral lower extremity ankle swelling Abdomen:  Positive bowel sounds, soft, flat NT/ND Neurologic: AAOX3  CBC    Component Value Date/Time   WBC 7.9 05/30/2022 0606   RBC 2.61 (L) 05/30/2022 0606   HGB 8.5 (L) 05/30/2022 0606   HGB 12.4 05/15/2013 1445   HCT 26.0 (L) 05/30/2022 0606   HCT 38.2 05/15/2013 1445   PLT 234 05/30/2022 0606   PLT 255 05/15/2013 1445   MCV 99.6 05/30/2022 0606   MCV 96 05/15/2013 1445   MCH 32.6 05/30/2022 0606   MCHC 32.7 05/30/2022 0606   RDW 14.9 05/30/2022 0606   RDW 13.3 05/15/2013 1445   LYMPHSABS 0.8 05/17/2022 0253   LYMPHSABS 2.0 05/15/2013 1445   MONOABS 1.0 05/17/2022 0253   MONOABS 0.6 05/15/2013 1445   EOSABS 0.1 05/17/2022 0253   EOSABS 0.4 05/15/2013 1445   BASOSABS 0.1 05/17/2022 0253   BASOSABS 0.1 05/15/2013 1445    BMET    Component Value Date/Time   NA 131 (L) 05/30/2022 0606   NA 133 (L) 07/15/2012 2105   K 3.4 (L)  05/30/2022 0606   K 3.8 07/18/2012 0503   CL 96 (L) 05/30/2022 0606   CL 100 07/15/2012 2105   CO2 27 05/30/2022 0606   CO2 28 07/15/2012 2105   GLUCOSE 121 (H) 05/30/2022 0606   GLUCOSE 136 (H) 07/15/2012 2105   BUN 11 05/30/2022 0606   BUN 10 07/15/2012 2105   CREATININE 0.66 05/30/2022 0606   CREATININE 0.68 07/15/2012 2105   CALCIUM 9.0 05/30/2022 0606   CALCIUM 8.4 (L) 07/15/2012 2105   GFRNONAA >60 05/30/2022 0606   GFRNONAA >60 07/15/2012 2105   GFRAA 60 (L) 06/19/2017 1429   GFRAA >60 07/15/2012 2105    INR    Component Value Date/Time   INR 1.1 05/27/2022 2206     Intake/Output Summary (Last 24 hours) at 05/30/2022 0846 Last data filed at 05/29/2022 2018 Gross per 24 hour  Intake 1140 ml  Output --  Net 1140 ml     Assessment/Plan:  82 y.o. female is s/p chemotherapy treatment for breast cancer now with DVT and PE.  * No surgery found *   Discussed with the patient prognosis related to DVT and pulmonary embolism.  Plan going forward is to anticoagulate with Eliquis 5 mg twice  daily.  If her lower extremity DVT extends or worsens we will consider IVC filter placement at that time.  Both the patient and the husband at the bedside are in agreement with the plan.  DVT prophylaxis:  Eliquis 5 MG BID   Drema Pry [facility] 05/30/2022 8:46 AM

## 2022-05-30 NOTE — Progress Notes (Signed)
Progress Note   Patient: Sabrina Wilson BZJ:[id number] DOB: 09/13/1940 DOA: 05/27/2022     3 DOS: the patient was seen and examined on 05/30/2022   Brief hospital course: 82 y.o. female  coming for right lower back pain. Pain is worse with deep inspiration and noted fevers today.   Chart review shows that patient was admitted on May 12, 2022 and discharged on May 17, 2022 for acute hypoxic respiratory failure demand ischemia bilateral pneumonia hypotension and severe sepsis. Pt is also finishing her last dose of paxlovid  Patient is a history of smoking and has a history of breast cancer and colon cancer and receives chemotherapy.  Patient also has history of chronic diastolic congestive heart failure.  CT angio done was negative for pulmonary embolism on 18 January.  Patient admitted on 05/27/2022 with right lower lobe pulmonary embolism and left lower extremity DVT.  Patient was started on heparin drip.  2/4.  Convert heparin drip over to Eliquis.  Replace magnesium.  Increase diet to regular diet for hyponatremia. 2/5.  Patient complaining of congestion.  Has wheezing in the lungs.  Will start Solu-Medrol and standing dose nebulizers.    Assessment and Plan: * Asthmatic bronchitis with exacerbation Today with wheezing in the lungs and congestion.  Start Solu-Medrol and standing dose albuterol nebulizers.  Continue her usual Incruse Ellipta inhaler.  Will repeat a chest x-ray.  Pulmonary embolism (HCC) Continue Eliquis.  Right lower lung pain likely secondary to possible pulmonary infarct.  DVT (deep venous thrombosis) (HCC) Left lower extremity-popliteal, posterior tibial and peroneal veins.  Continue Eliquis  Hyponatremia Advance diet to regular.  Discontinue hydrochlorothiazide.  Sodium up to 131  Hypomagnesemia Magnesium replaced  Hypertension Discontinue hydrochlorothiazide with hyponatremia   Acute hypoxic respiratory failure (HCC) Pulse ox of 89% documented last  night.  Pulse ox with room air on ambulation.   Hypokalemia Oral potassium replacement today  Anemia of chronic disease With ferritin of 789 this goes along with anemia of chronic disease.  Last hemoglobin 8.5.  Invasive lobular carcinoma of breast in female [facility]) Follow-up with Dr. Rogue Bussing as outpatient.        Subjective: Patient does have a little abdominal pain on the right lower abdomen.  Still having pain in her right lower lung with a deep breath.  Feels a little congested in the chest.  Admitted with PE and DVT.  Physical Exam: Vitals:   05/29/22 1928 05/29/22 2336 05/30/22 0532 05/30/22 0853  BP: (!) 100/58 116/64 (!) 116/57 123/69  Pulse: 81 83 73 77  Resp: '20 18 20 18  '$ Temp: 99.4 F (37.4 C) 100 F (37.8 C) 99.2 F (37.3 C) 98.8 F (37.1 C)  TempSrc: Oral Oral Oral   SpO2: 93% (!) 89% 90% 100%  Weight:      Height:       Physical Exam HENT:     Head: Normocephalic.     Mouth/Throat:     Pharynx: No oropharyngeal exudate.  Eyes:     General: Lids are normal.     Conjunctiva/sclera: Conjunctivae normal.  Cardiovascular:     Rate and Rhythm: Normal rate and regular rhythm.     Heart sounds: Normal heart sounds, S1 normal and S2 normal.  Pulmonary:     Breath sounds: Examination of the right-lower field reveals decreased breath sounds and wheezing. Examination of the left-lower field reveals decreased breath sounds and wheezing. Decreased breath sounds and wheezing present. No rhonchi or rales.  Abdominal:  Palpations: Abdomen is soft.     Tenderness: There is no abdominal tenderness.  Musculoskeletal:     Right lower leg: No swelling.     Left lower leg: No swelling.  Skin:    General: Skin is warm.     Findings: No rash.  Neurological:     Mental Status: She is alert and oriented to person, place, and time.     Data Reviewed: Sodium 131, potassium 3.4, creatinine 0.66, ferritin 79, hemoglobin 8.5  Family Communication: Spoke with husband  at the bedside  Disposition: Status is: Inpatient Remains inpatient appropriate because: Start Solu-Medrol and nebulizer treatments with wheezing today  Planned Discharge Destination: Home with Home Health    Time spent: 28 minutes  Author: Loletha Grayer, MD 05/30/2022 11:39 AM  For on call review [email or web address].

## 2022-05-30 NOTE — Telephone Encounter (Signed)
Pharmacy Patient Advocate Encounter  Insurance verification completed.    The patient is insured through Medicare Part D   The patient is currently admitted and ran test claims for the following: Eliquis.  Copays and coinsurance results were relayed to Inpatient clinical team.  

## 2022-05-30 NOTE — Assessment & Plan Note (Signed)
Oral potassium replacement today

## 2022-05-30 NOTE — Assessment & Plan Note (Addendum)
With ferritin of 789 this goes along with anemia of chronic disease.  Last hemoglobin 9.1.

## 2022-05-30 NOTE — TOC Benefit Eligibility Note (Signed)
Patient Teacher, English as a foreign language completed.    The patient is currently admitted and upon discharge could be taking Eliquis.  The current 30 day co-pay is 45.00.   The patient is insured through Medicare Part D   J

## 2022-05-30 NOTE — Progress Notes (Signed)
Occupational Therapy Treatment Patient Details Name: Sabrina Wilson MRN: [id number] DOB: 1940-10-20 Today's Date: 05/30/2022   History of present illness Sabrina Wilson is a 38yoF who comes to [facility] on 05/27/22 c Rt low back pain x 1 day, fever. PMH: remote colon CA, BrCA, recent completion of chemotherapy, recent admission with COVID PNA (1/18). RVP negative for RSV, COVID. Workup revealing of acute PE initiated on heparin therapy, 'non-occlusive thrombus within the left popliteal, posterior tibial  and peroneal veins.'   OT comments  Upon entering session, pt supine in bed with spouse present. Both agreeable to OT. Tx session focused on energy conservation techniques to improve activity tolerance and safety with ADLs. Education included the 4 P's (plan, prioritize, pace, position) of EC, pursed lip breathing, and specific strategies for ADLs (toileting, grooming, bathing/showering, dressing). OT discussed specific examples of sitting when possible to complete ADL tasks (I.e. dressing, bathing, meal prep), gathering all the necessary items in advance for a certain task, avoiding excessive bending/reaching, and taking rest breaks before you feel tired. Pt/spouse verbalized understanding. Pt will benefit from further opportunities to practice implementing energy conservation techniques during self-care tasks before returning home. Pt is making progress toward goal completion. D/C recommendation remains appropriate. OT will continue to follow acutely.     Recommendations for follow up therapy are one component of a multi-disciplinary discharge planning process, led by the attending physician.  Recommendations may be updated based on patient status, additional functional criteria and insurance authorization.    Follow Up Recommendations  No OT follow up     Assistance Recommended at Discharge Set up Supervision/Assistance  Patient can return home with the following  A little help with walking and/or  transfers;A little help with bathing/dressing/bathroom;Assistance with cooking/housework;Assist for transportation;Help with stairs or ramp for entrance   Equipment Recommendations  None recommended by OT    Recommendations for Other Services      Precautions / Restrictions Precautions Precautions: Fall Restrictions Weight Bearing Restrictions: No       Mobility Bed Mobility                    Transfers                         Balance                                           ADL either performed or assessed with clinical judgement   ADL Overall ADL's : Needs assistance/impaired                                       General ADL Comments: Focused on energy conservation education. Pt deferred toileting and grooming tasks this date.    Extremity/Trunk Assessment Upper Extremity Assessment Upper Extremity Assessment: Overall WFL for tasks assessed   Lower Extremity Assessment Lower Extremity Assessment: Generalized weakness   Cervical / Trunk Assessment Cervical / Trunk Assessment: Normal    Vision Baseline Vision/History: 1 Wears glasses Patient Visual Report: No change from baseline     Perception     Praxis      Cognition Arousal/Alertness: Awake/alert Behavior During Therapy: WFL for tasks assessed/performed Overall Cognitive Status: Within Functional Limits for tasks assessed  Exercises Other Exercises Other Exercises: Education provided re: energy conservation techniques with handout provided, fall safety at home, spouse inquiring about grab bar placement in shower, discussed use of shower seat for bathing    Shoulder Instructions       General Comments      Pertinent Vitals/ Pain       Pain Assessment Pain Assessment: No/denies pain  Home Living                                          Prior  Functioning/Environment              Frequency  Min 2X/week        Progress Toward Goals  OT Goals(current goals can now be found in the care plan section)  Progress towards OT goals: Progressing toward goals  Acute Rehab OT Goals Patient Stated Goal: return home OT Goal Formulation: With patient/family Time For Goal Achievement: 06/12/22 Potential to Achieve Goals: Good  Plan Discharge plan remains appropriate;Frequency remains appropriate    Co-evaluation                 AM-PAC OT "6 Clicks" Daily Activity     Outcome Measure   Help from another person eating meals?: None Help from another person taking care of personal grooming?: None Help from another person toileting, which includes using toliet, bedpan, or urinal?: A Little Help from another person bathing (including washing, rinsing, drying)?: A Little Help from another person to put on and taking off regular upper body clothing?: None Help from another person to put on and taking off regular lower body clothing?: A Little 6 Click Score: 21    End of Session    OT Visit Diagnosis: Unsteadiness on feet (R26.81);Muscle weakness (generalized) (M62.81)   Activity Tolerance Patient tolerated treatment well   Patient Left in bed;with call bell/phone within reach;with family/visitor present;with bed alarm set   Nurse Communication Mobility status        Time: 9379-0240 OT Time Calculation (min): 15 min  Charges: OT Treatments $Therapeutic Activity: 8-22 mins  [facility] MS, OTR/L ascom [phone]  05/30/22, 5:30 PM

## 2022-05-31 ENCOUNTER — Ambulatory Visit: Payer: Medicare Other

## 2022-05-31 ENCOUNTER — Telehealth: Payer: Self-pay | Admitting: Family Medicine

## 2022-05-31 DIAGNOSIS — J45901 Unspecified asthma with (acute) exacerbation: Secondary | ICD-10-CM | POA: Diagnosis not present

## 2022-05-31 DIAGNOSIS — I2699 Other pulmonary embolism without acute cor pulmonale: Secondary | ICD-10-CM | POA: Diagnosis not present

## 2022-05-31 DIAGNOSIS — I82402 Acute embolism and thrombosis of unspecified deep veins of left lower extremity: Secondary | ICD-10-CM | POA: Diagnosis not present

## 2022-05-31 LAB — BASIC METABOLIC PANEL
Anion gap: 9 (ref 5–15)
BUN: 16 mg/dL (ref 8–23)
CO2: 24 mmol/L (ref 22–32)
Calcium: 9.3 mg/dL (ref 8.9–10.3)
Chloride: 96 mmol/L — ABNORMAL LOW (ref 98–111)
Creatinine, Ser: 0.75 mg/dL (ref 0.44–1.00)
GFR, Estimated: >60 mL/min (ref >60)
Glucose, Bld: 225 mg/dL — ABNORMAL HIGH (ref 70–99)
Potassium: 4 mmol/L (ref 3.5–5.1)
Sodium: 129 mmol/L — ABNORMAL LOW (ref 135–145)

## 2022-05-31 LAB — CBC
HCT: 28 % — ABNORMAL LOW (ref 36.0–46.0)
Hemoglobin: 9.1 g/dL — ABNORMAL LOW (ref 12.0–15.0)
MCH: 32.5 pg (ref 26.0–34.0)
MCHC: 32.5 g/dL (ref 30.0–36.0)
MCV: 100 fL (ref 80.0–100.0)
Platelets: 281 10*3/uL (ref 150–400)
RBC: 2.8 MIL/uL — ABNORMAL LOW (ref 3.87–5.11)
RDW: 14.6 % (ref 11.5–15.5)
WBC: 7.8 10*3/uL (ref 4.0–10.5)
nRBC: 0 % (ref 0.0–0.2)

## 2022-05-31 MED ORDER — PREDNISONE 20 MG PO TABS: ORAL_TABLET | ORAL | 0 refills | Status: DC

## 2022-05-31 MED ORDER — ENSURE ENLIVE PO LIQD: 237.0000 mL | Freq: Two times a day (BID) | ORAL | 0 refills | Status: DC

## 2022-05-31 MED ORDER — APIXABAN 5 MG PO TABS: ORAL_TABLET | ORAL | 0 refills | Status: DC

## 2022-05-31 MED ORDER — GUAIFENESIN ER 600 MG PO TB12: 600.0000 mg | ORAL_TABLET | Freq: Two times a day (BID) | ORAL | 0 refills | Status: DC | PRN

## 2022-05-31 MED ORDER — AMOXICILLIN-POT CLAVULANATE 875-125 MG PO TABS: 1.0000 | ORAL_TABLET | Freq: Two times a day (BID) | ORAL | 0 refills | Status: AC

## 2022-05-31 MED ORDER — HYDROCODONE-ACETAMINOPHEN 5-325 MG PO TABS: 1.0000 | ORAL_TABLET | Freq: Four times a day (QID) | ORAL | 0 refills | Status: DC | PRN

## 2022-05-31 NOTE — Telephone Encounter (Signed)
Pt its admitted '@[facility]'$ , however, she's been discharged today. Pt nurse called in to book a hospital F/U with provider, She's on for Friday 2/9 '@11am'$ .

## 2022-05-31 NOTE — TOC Transition Note (Signed)
Transition of Care [facility]) - CM/SW Discharge Note   Patient Details  Name: Sabrina Wilson MRN: [id number] Date of Birth: [birth year]  Transition of Care [facility]) CM/SW Contact:  Candie Chroman, LCSW Phone Number: 05/31/2022, 10:45 AM   Clinical Narrative:  Patient has orders to discharge home today. [facility] [place] liaison is aware. No further concerns. CSW signing off.   Final next level of care: Home w Home Health Services Barriers to Discharge: Barriers Resolved   Patient Goals and CMS Choice CMS Medicare.gov Compare Post Acute Care list provided to:: Patient    Discharge Placement                      Patient and family notified of of transfer: 05/31/22  Discharge Plan and Services Additional resources added to the After Visit Summary for       Post Acute Care Choice: Post: PT [place]: [facility] Date [facility] Agency Contacted: 05/31/22   Representative spoke with at [place]: Juanda Crumble  Social Determinants of Health ([place]) Interventions SDOH Screenings   Food Insecurity: No Food Insecurity (05/28/2022)  Housing: Low Risk  (05/28/2022)  Transportation Needs: No Transportation Needs (05/28/2022)  Utilities: Not At Risk (05/28/2022)  Depression (PHQ2-9): Low Risk  (02/11/2022)  Financial Resource Strain: Low Risk  (05/19/2022)  Stress: No Stress Concern Present (11/16/2020)  Tobacco Use: Medium Risk (05/27/2022)     Readmission Risk Interventions     No data to display

## 2022-05-31 NOTE — Discharge Summary (Signed)
Physician Discharge Summary   Patient: Sabrina Wilson MRN: [id number] DOB: May 30, 1940  Admit date:     05/27/2022  Discharge date: 05/31/22  Discharge Physician: Loletha Grayer   PCP: Leone Haven, MD   Recommendations at discharge:   Follow-up PCP 5 days Follow-up Dr. Rogue Bussing oncology  Discharge Diagnoses: Principal Problem:   Asthmatic bronchitis with exacerbation Active Problems:   Pulmonary embolism (HCC)   DVT (deep venous thrombosis) (HCC)   Hypomagnesemia   Hyponatremia   Hypertension   Acute hypoxic respiratory failure (HCC)   Invasive lobular carcinoma of breast in female [facility])   Anemia of chronic disease   Hypokalemia   Hospital Course: 82 y.o. female  coming for right lower back pain. Pain is worse with deep inspiration and noted fevers today.   Chart review shows that patient was admitted on May 12, 2022 and discharged on May 17, 2022 for acute hypoxic respiratory failure demand ischemia bilateral pneumonia hypotension and severe sepsis. Pt is also finishing her last dose of paxlovid  Patient is a history of smoking and has a history of breast cancer and colon cancer and receives chemotherapy.  Patient also has history of chronic diastolic congestive heart failure.  CT angio done was negative for pulmonary embolism on 18 January.  Patient admitted on 05/27/2022 with right lower lobe pulmonary embolism and left lower extremity DVT.  Patient was started on heparin drip.  2/4.  Convert heparin drip over to Eliquis.  Replace magnesium.  Increase diet to regular diet for hyponatremia. 2/5.  Patient complaining of congestion.  Has wheezing in the lungs.  Will start Solu-Medrol and standing dose nebulizers. 2/6.  Patient feeling better.  Chest congestion improved.  Solu-Medrol given today and will give 3 more days of prednisone upon discharge.  Less pain with regards to pulmonary embolism.  Questionable pneumonia seen on chest x-ray yesterday but more likely  secondary to where the pulmonary embolism and infarct is.  I did prescribe antibiotics.    Assessment and Plan: * Asthmatic bronchitis with exacerbation Questionable pneumonia right lower lobe.  Started on Zosyn yesterday switch over to Augmentin upon discharge.  Solu-Medrol started yesterday and a dose given today.  3 more days of prednisone upon discharge.  Patient has inhalers at home.  Lungs are clear up on discharge.  Pulmonary embolism (HCC) Continue Eliquis.  Right lower lung pain likely secondary to possible pulmonary infarct.  Risk of bleeding explained to patient and husband secondary to Eliquis.  DVT (deep venous thrombosis) (HCC) Left lower extremity-popliteal, posterior tibial and peroneal veins.  Continue Eliquis  Hyponatremia Advance diet to regular.  Discontinue hydrochlorothiazide.  Sodium 129.  Hypomagnesemia Magnesium replaced  Hypertension Discontinue hydrochlorothiazide with hyponatremia   Acute hypoxic respiratory failure (HCC) Pulse ox of 89% 2 nights ago.  Patient on room air currently.  Hypokalemia Oral potassium replacement today  Anemia of chronic disease With ferritin of 789 this goes along with anemia of chronic disease.  Last hemoglobin 9.1.  Invasive lobular carcinoma of breast in female [facility]) Follow-up with Dr. Rogue Bussing as outpatient.         Consultants: Vascular surgery Procedures performed: None Disposition: Home health Diet recommendation:  Cardiac diet DISCHARGE MEDICATION: Allergies as of 05/31/2022       Reactions   Sulfa Antibiotics Hives   As a child        Medication List     STOP taking these medications    hydrochlorothiazide 12.5 MG tablet Commonly known as: HYDRODIURIL  Paxlovid (300/100) 20 x 150 MG & 10 x '100MG'$  Tbpk Generic drug: nirmatrelvir & ritonavir       TAKE these medications    albuterol 108 (90 Base) MCG/ACT inhaler Commonly known as: VENTOLIN HFA Inhale 2 puffs into the lungs every 6  (six) hours as needed for wheezing or shortness of breath.   amoxicillin-clavulanate 875-125 MG tablet Commonly known as: AUGMENTIN Take 1 tablet by mouth 2 (two) times daily for 4 days.   apixaban 5 MG Tabs tablet Commonly known as: ELIQUIS Two tabs po twice a day for five days then 1 tab po twice a day afterwards   atorvastatin 10 MG tablet Commonly known as: LIPITOR Take 1 tablet (10 mg total) by mouth daily.   CALCIUM 1200 PO Take 1 tablet by mouth daily.   feeding supplement Liqd Take 237 mLs by mouth 2 (two) times daily between meals.   FISH OIL PO Take 1 capsule by mouth daily.   guaiFENesin 600 MG 12 hr tablet Commonly known as: MUCINEX Take 1 tablet (600 mg total) by mouth 2 (two) times daily as needed for cough or to loosen phlegm.   HYDROcodone-acetaminophen 5-325 MG tablet Commonly known as: NORCO/VICODIN Take 1 tablet by mouth every 6 (six) hours as needed for severe pain.   niacinamide 500 MG tablet Take 500 mg by mouth 2 (two) times daily.   ondansetron 8 MG tablet Commonly known as: ZOFRAN One pill every 8 hours as needed for nausea/vomitting.   Opzelura 1.5 % Crea Generic drug: Ruxolitinib Phosphate Apply 1 application topically daily. What changed: additional instructions   predniSONE 20 MG tablet Commonly known as: DELTASONE Two tabs po daily for three days Start taking on: June 01, 2022   prochlorperazine 10 MG tablet Commonly known as: COMPAZINE Take 1 tablet (10 mg total) by mouth every 6 (six) hours as needed for nausea or vomiting.   umeclidinium bromide 62.5 MCG/ACT Aepb Commonly known as: INCRUSE ELLIPTA Inhale 1 puff into the lungs daily.   VITAMIN D (CHOLECALCIFEROL) PO Take 2,000 Units by mouth daily.        Follow-up Information     [facility] Follow up.   Specialty: Home Health Services Why: They will follow up with you for your home health physical therapy needs. Contact information: [address] [place] [address] [phone]         Leone Haven, MD. Go in 5 day(s).   Specialty: Family Medicine Why: Appointment on Friday, 06/03/2022 at 11:00am. Contact information: [address] [place] [place] [address] [phone]         Cammie Sickle, MD. Go to.   Specialties: Internal Medicine, Oncology Why: Appointment on Monday, 06/13/2022 at 3:30pm. Contact information: [place] [address] [phone]                Discharge Exam: Danley Danker Weights   05/27/22 2106  Weight: 81.5 kg   Physical Exam HENT:     Head: Normocephalic.     Mouth/Throat:     Pharynx: No oropharyngeal exudate.  Eyes:     General: Lids are normal.     Conjunctiva/sclera: Conjunctivae normal.  Cardiovascular:     Rate and Rhythm: Normal rate and regular rhythm.     Heart sounds: Normal heart sounds, S1 normal and S2 normal.  Pulmonary:     Breath sounds: Examination of the right-lower field reveals decreased breath sounds. Examination of the  left-lower field reveals decreased breath sounds. Decreased breath sounds present. No wheezing, rhonchi or rales.  Abdominal:     Palpations: Abdomen is soft.     Tenderness: There is no abdominal tenderness.  Musculoskeletal:     Right lower leg: No swelling.     Left lower leg: No swelling.  Skin:    General: Skin is warm.     Findings: No rash.  Neurological:     Mental Status: She is alert and oriented to person, place, and time.      Condition at discharge: stable  The results of significant diagnostics from this hospitalization (including imaging, microbiology, ancillary and laboratory) are listed below for reference.   Imaging Studies: DG Chest Port 1 View  Result Date: 05/30/2022 CLINICAL DATA:  Wheezing, asthmatic bronchitis with exacerbation, hypertension, former smoker EXAM: PORTABLE CHEST 1 VIEW COMPARISON:  Portable exam 1354 hours compared to 05/12/2022 FINDINGS: Normal  heart size, mediastinal contours, and pulmonary vascularity. Increased markings RIGHT infrahilar question atelectasis versus infiltrate. Remaining lungs clear. No pleural effusion or pneumothorax. Osseous structures unremarkable. IMPRESSION: Increased RIGHT infrahilar markings question atelectasis versus infiltrate at medial RIGHT lung base. Electronically Signed   By: Lavonia Dana M.D.   On: 05/30/2022 14:45   US Venous Img Lower Bilateral  Result Date: 05/27/2022 CLINICAL DATA:  PE EXAM: BILATERAL LOWER EXTREMITY VENOUS DOPPLER ULTRASOUND TECHNIQUE: Gray-scale sonography with graded compression, as well as color Doppler and duplex ultrasound were performed to evaluate the lower extremity deep venous systems from the level of the common femoral vein and including the common femoral, femoral, profunda femoral, popliteal and calf veins including the posterior tibial, peroneal and gastrocnemius veins when visible. The superficial great saphenous vein was also interrogated. Spectral Doppler was utilized to evaluate flow at rest and with distal augmentation maneuvers in the common femoral, femoral and popliteal veins. COMPARISON:  None Available. FINDINGS: RIGHT LOWER EXTREMITY Common Femoral Vein: No evidence of thrombus. Normal compressibility, respiratory phasicity and response to augmentation. Saphenofemoral Junction: No evidence of thrombus. Normal compressibility and flow on color Doppler imaging. Profunda Femoral Vein: No evidence of thrombus. Normal compressibility and flow on color Doppler imaging. Femoral Vein: No evidence of thrombus. Normal compressibility, respiratory phasicity and response to augmentation. Popliteal Vein: No evidence of thrombus. Normal compressibility, respiratory phasicity and response to augmentation. Calf Veins: No evidence of thrombus. Normal compressibility and flow on color Doppler imaging. Superficial Great Saphenous Vein: No evidence of thrombus. Normal compressibility. Venous  Reflux:  None. Other Findings:  None. LEFT LOWER EXTREMITY Common Femoral Vein: No evidence of thrombus. Normal compressibility, respiratory phasicity and response to augmentation. Saphenofemoral Junction: No evidence of thrombus. Normal compressibility and flow on color Doppler imaging. Profunda Femoral Vein: No evidence of thrombus. Normal compressibility and flow on color Doppler imaging. Femoral Vein: No evidence of thrombus. Normal compressibility, respiratory phasicity and response to augmentation. Popliteal Vein: There is nonocclusive thrombus within the popliteal vein. Calf Veins: There is nonocclusive thrombus within the posterior tibial and peroneal veins. Superficial Great Saphenous Vein: No evidence of thrombus. Normal compressibility. Venous Reflux:  None. Other Findings:  None. IMPRESSION: 1. Nonocclusive thrombus within the left popliteal, posterior tibial and peroneal veins. 2. No evidence of deep venous thrombosis in the right lower extremity. Electronically Signed   By: Ronney Asters M.D.   On: 05/27/2022 21:54   CT HEAD WO CONTRAST (5MM)  Result Date: 05/27/2022 CLINICAL DATA:  Metastatic disease evaluation EXAM: CT HEAD WITHOUT CONTRAST TECHNIQUE: Contiguous axial images were  obtained from the base of the skull through the vertex without intravenous contrast. RADIATION DOSE REDUCTION: This exam was performed according to the departmental dose-optimization program which includes automated exposure control, adjustment of the mA and/or kV according to patient size and/or use of iterative reconstruction technique. COMPARISON:  None Available. FINDINGS: Brain: There is no mass, hemorrhage or extra-axial collection. The size and configuration of the ventricles and extra-axial CSF spaces are normal. The brain parenchyma is normal, without acute or chronic infarction. No enhancing lesions. Vascular: Atherosclerotic calcification of the vertebral and internal carotid arteries at the skull base. No  abnormal hyperdensity of the major intracranial arteries or dural venous sinuses. Skull: The visualized skull base, calvarium and extracranial soft tissues are normal. Sinuses/Orbits: No fluid levels or advanced mucosal thickening of the visualized paranasal sinuses. No mastoid or middle ear effusion. The orbits are normal. IMPRESSION: 1. No acute intracranial abnormality. No enhancing lesions. 2. Atherosclerotic calcification of the vertebral and internal carotid arteries at the skull base. Electronically Signed   By: Ulyses Jarred M.D.   On: 05/27/2022 20:51   CT ABDOMEN PELVIS W CONTRAST  Result Date: 05/27/2022 CLINICAL DATA:  Right lower back pain since yesterday EXAM: CT ABDOMEN AND PELVIS WITH CONTRAST TECHNIQUE: Multidetector CT imaging of the abdomen and pelvis was performed using the standard protocol following bolus administration of intravenous contrast. RADIATION DOSE REDUCTION: This exam was performed according to the departmental dose-optimization program which includes automated exposure control, adjustment of the mA and/or kV according to patient size and/or use of iterative reconstruction technique. CONTRAST:  135m OMNIPAQUE IOHEXOL 350 MG/ML SOLN COMPARISON:  05/12/2022 06/12/2012 FINDINGS: Lower chest: Please see separately reported examination of the chest. Hepatobiliary: No solid liver abnormality is seen. Gallstones and sludge. No gallbladder wall thickening, or biliary dilatation. Pancreas: Unremarkable. No pancreatic ductal dilatation or surrounding inflammatory changes. Spleen: Normal in size without significant abnormality. Adrenals/Urinary Tract: Adrenal glands are unremarkable. Simple, benign renal cortical cysts, for which no further follow-up or characterization is required. Kidneys are otherwise normal, without renal calculi, solid lesion, or hydronephrosis. Bladder is unremarkable. Stomach/Bowel: Stomach is within normal limits. Appendix appears normal. No evidence of bowel wall  thickening, distention, or inflammatory changes. Large burden of stool in the proximal colon. Status post sigmoid colon resection and reanastomosis. Vascular/Lymphatic: Aortic atherosclerosis. No enlarged abdominal or pelvic lymph nodes. Reproductive: Calcified uterine fibroids. Right ovarian cyst, again unchanged compared to remote prior examination dated 2014 and definitively benign. No follow-up imaging recommended. Note: This recommendation does not apply to premenarchal patients and to those with increased risk (genetic, family history, elevated tumor markers or other high-risk factors) of ovarian cancer. Reference: JACR 2020 Feb; 17(2):248-254 Other: No abdominal wall hernia or abnormality. No ascites. Musculoskeletal: No acute or significant osseous findings. IMPRESSION: 1. No acute CT findings of the abdomen or pelvis to explain right lower quadrant pain. 2. Cholelithiasis without evidence of acute cholecystitis. 3. Large burden of stool in the proximal colon. Aortic Atherosclerosis (ICD10-I70.0). Electronically Signed   By: ADelanna AhmadiM.D.   On: 05/27/2022 20:26   CT Angio Chest PE W and/or Wo Contrast  Result Date: 05/27/2022 CLINICAL DATA:  PE suspected EXAM: CT ANGIOGRAPHY CHEST WITH CONTRAST TECHNIQUE: Multidetector CT imaging of the chest was performed using the standard protocol during bolus administration of intravenous contrast. Multiplanar CT image reconstructions and MIPs were obtained to evaluate the vascular anatomy. RADIATION DOSE REDUCTION: This exam was performed according to the departmental dose-optimization program which includes automated exposure  control, adjustment of the mA and/or kV according to patient size and/or use of iterative reconstruction technique. CONTRAST:  179m OMNIPAQUE IOHEXOL 350 MG/ML SOLN COMPARISON:  05/12/2022 FINDINGS: Cardiovascular: Satisfactory opacification of the pulmonary arteries to the segmental level. Positive examination for pulmonary embolism,  with new segmental to subsegmental embolus present in the right lower lobe (series 6, image 261). Normal heart size. RV LV ratio 0.9. Left and right coronary artery calcifications. No pericardial effusion. Aortic atherosclerosis. Mediastinum/Nodes: No enlarged mediastinal, hilar, or axillary lymph nodes. Thyroid gland, trachea, and esophagus demonstrate no significant findings. Lungs/Pleura: Diffuse bilateral bronchial wall thickening. Background of fine centrilobular pulmonary nodules, most concentrated in the lung apices. Rounded subpleural ground-glass opacity of the dependent right lower lobe, distal to embolus (series 5, image 119). No pleural effusion or pneumothorax. Upper Abdomen: Please see separately reported examination of the abdomen and pelvis. Musculoskeletal: No chest wall abnormality. No acute osseous findings. Review of the MIP images confirms the above findings. IMPRESSION: 1. Positive examination for pulmonary embolism, with new segmental to subsegmental embolus present in the right lower lobe. 2. Rounded subpleural ground-glass opacity of the dependent right lower lobe, distal to embolus, most consistent with developing pulmonary infarct. 3. Diffuse bilateral bronchial wall thickening. 4. Background of fine centrilobular pulmonary nodules, most concentrated in the lung apices, consistent with smoking-related respiratory bronchiolitis. 5. Coronary artery disease. These results were called by telephone at the time of interpretation on 05/27/2022 at 8:16 pm to Dr. PMerlyn Lot, who verbally acknowledged these results. Aortic Atherosclerosis (ICD10-I70.0). Electronically Signed   By: ADelanna AhmadiM.D.   On: 05/27/2022 20:20   CT Abdomen Pelvis W Contrast  Result Date: 05/12/2022 CLINICAL DATA:  Abdominal pain EXAM: CT ABDOMEN AND PELVIS WITH CONTRAST TECHNIQUE: Multidetector CT imaging of the abdomen and pelvis was performed using the standard protocol following bolus administration of  intravenous contrast. RADIATION DOSE REDUCTION: This exam was performed according to the departmental dose-optimization program which includes automated exposure control, adjustment of the mA and/or kV according to patient size and/or use of iterative reconstruction technique. CONTRAST:  1052mOMNIPAQUE IOHEXOL 350 MG/ML SOLN COMPARISON:  06/12/2012 FINDINGS: Examination of the abdomen pelvis is generally limited by breath motion artifact. Lower chest: Please see separately reported examination of the chest. Hepatobiliary: No solid liver abnormality is seen. Tiny gallstones and or sludge (series 2, image 32). No gallbladder wall thickening, or biliary dilatation. Pancreas: Unremarkable. No pancreatic ductal dilatation or surrounding inflammatory changes. Spleen: Normal in size without significant abnormality. Adrenals/Urinary Tract: Adrenal glands are unremarkable. Kidneys are normal, without renal calculi, solid lesion, or hydronephrosis. Bladder is unremarkable. Stomach/Bowel: Stomach is within normal limits. Appendix appears normal. No evidence of bowel wall thickening, distention, or inflammatory changes. Evidence of prior sigmoid colon resection and reanastomosis. Vascular/Lymphatic: Aortic atherosclerosis. No enlarged abdominal or pelvic lymph nodes. Reproductive: Calcified uterine fibroids. Right ovarian cyst measuring 3.4 x 2.2 cm (series 2, image 67). This is unchanged compared to remote prior examination dated 2014 and definitively benign. No further follow-up or characterization is required. No follow-up imaging recommended. Note: This recommendation does not apply to premenarchal patients and to those with increased risk (genetic, family history, elevated tumor markers or other high-risk factors) of ovarian cancer. Reference: JACR 2020 Feb; 17(2):248-254 Other: No abdominal wall hernia or abnormality. No ascites. Musculoskeletal: No acute or significant osseous findings. IMPRESSION: 1. Examination of the  abdomen and pelvis is generally limited by breath motion artifact. 2. Within this limitation, no acute CT findings  of the abdomen or pelvis to explain abdominal pain. 3. Tiny gallstones and or sludge. No evidence of acute cholecystitis. 4. Calcified uterine fibroids. Aortic Atherosclerosis (ICD10-I70.0). Electronically Signed   By: Delanna Ahmadi M.D.   On: 05/12/2022 14:40   CT Angio Chest PE W/Cm &/Or Wo Cm  Result Date: 05/12/2022 CLINICAL DATA:  PE suspected, shortness of breath increasing for 1 week EXAM: CT ANGIOGRAPHY CHEST WITH CONTRAST TECHNIQUE: Multidetector CT imaging of the chest was performed using the standard protocol during bolus administration of intravenous contrast. Multiplanar CT image reconstructions and MIPs were obtained to evaluate the vascular anatomy. RADIATION DOSE REDUCTION: This exam was performed according to the departmental dose-optimization program which includes automated exposure control, adjustment of the mA and/or kV according to patient size and/or use of iterative reconstruction technique. CONTRAST:  119m OMNIPAQUE IOHEXOL 350 MG/ML SOLN COMPARISON:  None Available. FINDINGS: Cardiovascular: Satisfactory opacification of the pulmonary arteries to the segmental level. No evidence of pulmonary embolism. Normal heart size. Right coronary artery calcifications. No pericardial effusion. Aortic atherosclerosis. Mediastinum/Nodes: No enlarged mediastinal, hilar, or axillary lymph nodes. Thyroid gland, trachea, and esophagus demonstrate no significant findings. Lungs/Pleura: Diffuse bilateral bronchial wall thickening. Background of very fine centrilobular pulmonary nodules. Bandlike atelectasis or consolidation of the posterior lingula (series 5, 41). No pleural effusion or pneumothorax. Upper Abdomen: Please see separately reported examination of the abdomen and pelvis. Musculoskeletal: No chest wall abnormality. No acute osseous findings. Review of the MIP images confirms the  above findings. IMPRESSION: 1. Negative examination for pulmonary embolism. 2. Diffuse bilateral bronchial wall thickening, consistent with nonspecific infectious or inflammatory bronchitis. 3. Background of very fine centrilobular pulmonary nodules, most commonly seen in smoking-related respiratory bronchiolitis although generally nonspecific and infectious or inflammatory. 4. Coronary artery disease. Aortic Atherosclerosis (ICD10-I70.0). Electronically Signed   By: ADelanna AhmadiM.D.   On: 05/12/2022 14:34   DG Chest Port 1 View  Result Date: 05/12/2022 CLINICAL DATA:  Shortness of breath for 1 week following chemotherapy EXAM: PORTABLE CHEST 1 VIEW COMPARISON:  Chest radiograph 08/18/2020 FINDINGS: The heart is at the upper limits of normal for size. The mediastinal contours are normal. There is no focal consolidation or pulmonary edema. There is no pleural effusion or pneumothorax There is no acute osseous abnormality. IMPRESSION: No radiographic evidence of acute cardiopulmonary process. Electronically Signed   By: PValetta MoleM.D.   On: 05/12/2022 13:04    Microbiology: Results for orders placed or performed during the hospital encounter of 05/12/22  Resp panel by RT-PCR (RSV, Flu A&B, Covid) Anterior Nasal Swab     Status: None   Collection Time: 05/12/22 12:49 PM   Specimen: Anterior Nasal Swab  Result Value Ref Range Status   SARS Coronavirus 2 by RT PCR NEGATIVE NEGATIVE Final    Comment: (NOTE) SARS-CoV-2 target nucleic acids are NOT DETECTED.  The SARS-CoV-2 RNA is generally detectable in upper respiratory specimens during the acute phase of infection. The lowest concentration of SARS-CoV-2 viral copies this assay can detect is 138 copies/mL. A negative result does not preclude SARS-Cov-2 infection and should not be used as the sole basis for treatment or other patient management decisions. A negative result may occur with  improper specimen collection/handling, submission of  specimen other than nasopharyngeal swab, presence of viral mutation(s) within the areas targeted by this assay, and inadequate number of viral copies(<138 copies/mL). A negative result must be combined with clinical observations, patient history, and epidemiological information. The expected result is Negative.  Fact Sheet for Patients:  EntrepreneurPulse.com.au  Fact Sheet for Healthcare Providers:  IncredibleEmployment.be  This test is no t yet approved or cleared by the [place] FDA and  has been authorized for detection and/or diagnosis of SARS-CoV-2 by FDA under an Emergency Use Authorization (EUA). This EUA will remain  in effect (meaning this test can be used) for the duration of the COVID-19 declaration under Section 564(b)(1) of the Act, 21 U.S.C.section 360bbb-3(b)(1), unless the authorization is terminated  or revoked sooner.       Influenza A by PCR NEGATIVE NEGATIVE Final   Influenza B by PCR NEGATIVE NEGATIVE Final    Comment: (NOTE) The Xpert Xpress SARS-CoV-2/FLU/RSV plus assay is intended as an aid in the diagnosis of influenza from Nasopharyngeal swab specimens and should not be used as a sole basis for treatment. Nasal washings and aspirates are unacceptable for Xpert Xpress SARS-CoV-2/FLU/RSV testing.  Fact Sheet for Patients: EntrepreneurPulse.com.au  Fact Sheet for Healthcare Providers: IncredibleEmployment.be  This test is not yet approved or cleared by the [place] FDA and has been authorized for detection and/or diagnosis of SARS-CoV-2 by FDA under an Emergency Use Authorization (EUA). This EUA will remain in effect (meaning this test can be used) for the duration of the COVID-19 declaration under Section 564(b)(1) of the Act, 21 U.S.C. section 360bbb-3(b)(1), unless the authorization is terminated or revoked.     Resp Syncytial Virus by PCR NEGATIVE NEGATIVE Final     Comment: (NOTE) Fact Sheet for Patients: EntrepreneurPulse.com.au  Fact Sheet for Healthcare Providers: IncredibleEmployment.be  This test is not yet approved or cleared by the [place] FDA and has been authorized for detection and/or diagnosis of SARS-CoV-2 by FDA under an Emergency Use Authorization (EUA). This EUA will remain in effect (meaning this test can be used) for the duration of the COVID-19 declaration under Section 564(b)(1) of the Act, 21 U.S.C. section 360bbb-3(b)(1), unless the authorization is terminated or revoked.  Performed at [facility], [address] [place], [place] [address]   Blood Culture (routine x 2)     Status: None   Collection Time: 05/12/22 12:49 PM   Specimen: BLOOD  Result Value Ref Range Status   Specimen Description BLOOD LEFT ANTECUBITAL  Final   Special Requests BOTTLES DRAWN AEROBIC AND ANAEROBIC BCLV  Final   Culture   Final    NO GROWTH 5 DAYS Performed at [facility], [address], [place], [place] [address]    Report Status 05/17/2022 FINAL  Final  Blood Culture (routine x 2)     Status: None   Collection Time: 05/12/22 12:49 PM   Specimen: BLOOD  Result Value Ref Range Status   Specimen Description BLOOD LEFT HAND  Final   Special Requests BOTTLES DRAWN AEROBIC AND ANAEROBIC BCAV  Final   Culture   Final    NO GROWTH 5 DAYS Performed at [facility], [address], [place], [place] [address]    Report Status 05/17/2022 FINAL  Final  Urine Culture     Status: None   Collection Time: 05/12/22  1:34 PM   Specimen: In/Out Cath Urine  Result Value Ref Range Status   Specimen Description   Final    IN/OUT CATH URINE Performed at [facility], [address] [place], [place] [address]    Special Requests   Final    NONE Performed at [facility], [address], [place], [place] [address]    Culture  Final    NO  GROWTH Performed at [facility], [place] [address] [place], [place] [address]    Report Status 05/13/2022 FINAL  Final  Expectorated Sputum Assessment w Gram Stain, Rflx to Resp Cult     Status: None   Collection Time: 05/12/22  5:33 PM   Specimen: Expectorated Sputum  Result Value Ref Range Status   Specimen Description EXPECTORATED SPUTUM  Final   Special Requests NONE  Final   Sputum evaluation   Final    THIS SPECIMEN IS ACCEPTABLE FOR SPUTUM CULTURE Performed at [facility], [address] [place], [place] [address]    Report Status 05/12/2022 FINAL  Final  Respiratory (~20 pathogens) panel by PCR     Status: None   Collection Time: 05/12/22  5:33 PM   Specimen: Nasopharyngeal Swab; Respiratory  Result Value Ref Range Status   Adenovirus NOT DETECTED NOT DETECTED Final   Coronavirus 229E NOT DETECTED NOT DETECTED Final    Comment: (NOTE) The Coronavirus on the Respiratory Panel, DOES NOT test for the novel  Coronavirus (2019 nCoV)    Coronavirus HKU1 NOT DETECTED NOT DETECTED Final   Coronavirus NL63 NOT DETECTED NOT DETECTED Final   Coronavirus OC43 NOT DETECTED NOT DETECTED Final   Metapneumovirus NOT DETECTED NOT DETECTED Final   Rhinovirus / Enterovirus NOT DETECTED NOT DETECTED Final   Influenza A NOT DETECTED NOT DETECTED Final   Influenza B NOT DETECTED NOT DETECTED Final   Parainfluenza Virus 1 NOT DETECTED NOT DETECTED Final   Parainfluenza Virus 2 NOT DETECTED NOT DETECTED Final   Parainfluenza Virus 3 NOT DETECTED NOT DETECTED Final   Parainfluenza Virus 4 NOT DETECTED NOT DETECTED Final   Respiratory Syncytial Virus NOT DETECTED NOT DETECTED Final   Bordetella pertussis NOT DETECTED NOT DETECTED Final   Bordetella Parapertussis NOT DETECTED NOT DETECTED Final   Chlamydophila pneumoniae NOT DETECTED NOT DETECTED Final   Mycoplasma pneumoniae NOT DETECTED NOT DETECTED Final    Comment: Performed at [facility], [place] [address]  [place], [place] [address]  Culture, Respiratory w Gram Stain     Status: None   Collection Time: 05/12/22  5:33 PM  Result Value Ref Range Status   Specimen Description   Final    EXPECTORATED SPUTUM Performed at [facility], [address] [place], [place] [address]    Special Requests   Final    NONE Reflexed from [id number] [id number] Performed at [place], [place] [address]    Gram Stain   Final    MODERATE WBC PRESENT,BOTH PMN AND MONONUCLEAR MODERATE GRAM POSITIVE COCCI IN PAIRS FEW GRAM NEGATIVE RODS RARE SQUAMOUS EPITHELIAL CELLS PRESENT Performed at [facility], [place] [address] [place], [place] [address]    Culture FEW STAPHYLOCOCCUS AUREUS  Final   Report Status 05/16/2022 FINAL  Final   Organism ID, Bacteria STAPHYLOCOCCUS AUREUS  Final      Susceptibility   Staphylococcus aureus - MIC*    CIPROFLOXACIN <=0.5 SENSITIVE Sensitive     ERYTHROMYCIN <=0.25 SENSITIVE Sensitive     GENTAMICIN <=0.5 SENSITIVE Sensitive     OXACILLIN 0.5 SENSITIVE Sensitive     TETRACYCLINE <=1 SENSITIVE Sensitive     VANCOMYCIN 1 SENSITIVE Sensitive     TRIMETH/SULFA <=10 SENSITIVE Sensitive     CLINDAMYCIN <=0.25 SENSITIVE Sensitive     RIFAMPIN <=0.5 SENSITIVE Sensitive     Inducible Clindamycin NEGATIVE Sensitive     *  FEW STAPHYLOCOCCUS AUREUS  MRSA Next Gen by PCR, Nasal     Status: None   Collection Time: 05/14/22  5:36 PM   Specimen: Nasal Mucosa; Nasal Swab  Result Value Ref Range Status   MRSA by PCR Next Gen NOT DETECTED NOT DETECTED Final    Comment: (NOTE) The GeneXpert MRSA Assay (FDA approved for NASAL specimens only), is one component of a comprehensive MRSA colonization surveillance program. It is not intended to diagnose MRSA infection nor to guide or monitor treatment for MRSA infections. Test performance is not FDA approved in patients less than 63 years old. Performed at [facility], [address]  [place], [place] [address]     Labs: CBC: Recent Labs  Lab 05/28/22 0530 05/28/22 0925 05/29/22 0929 05/30/22 0606 05/31/22 0410  WBC 11.4* 12.0* 10.4 7.9 7.8  HGB 8.5* 9.2* 8.7* 8.5* 9.1*  HCT 26.8* 29.1* 26.7* 26.0* 28.0*  MCV 101.9* 102.8* 100.0 99.6 100.0  PLT 189 205 227 234 010   Basic Metabolic Panel: Recent Labs  Lab 05/27/22 1852 05/29/22 0929 05/30/22 0606 05/31/22 0410  NA 131* 128* 131* 129*  K 3.8 3.8 3.4* 4.0  CL 94* 95* 96* 96*  CO2 '27 25 27 24  '$ GLUCOSE 141* 108* 121* 225*  BUN '13 14 11 16  '$ CREATININE 0.89 0.69 0.66 0.75  CALCIUM 10.2 9.0 9.0 9.3  MG  --  1.6* 2.0  --    Liver Function Tests: Recent Labs  Lab 05/29/22 0929  AST 12*  ALT 14  ALKPHOS 47  BILITOT 1.1  PROT 5.7*  ALBUMIN 3.0*     Discharge time spent: greater than 30 minutes.  Signed: Loletha Grayer, MD [facility] 05/31/2022

## 2022-06-01 ENCOUNTER — Ambulatory Visit: Payer: Medicare Other

## 2022-06-01 ENCOUNTER — Telehealth: Payer: Self-pay | Admitting: *Deleted

## 2022-06-01 ENCOUNTER — Other Ambulatory Visit: Payer: Self-pay

## 2022-06-01 DIAGNOSIS — J449 Chronic obstructive pulmonary disease, unspecified: Secondary | ICD-10-CM | POA: Diagnosis not present

## 2022-06-01 NOTE — Patient Outreach (Signed)
  Care Coordination [facility] Note Transition Care Management Unsuccessful Follow-up Telephone Call  Date of discharge and from where:  [facility] [phone] Pulmonary Embolism  Attempts:  1st Attempt  Reason for unsuccessful TCM follow-up call:  Left voice message  [facility] [phone]

## 2022-06-02 ENCOUNTER — Encounter: Payer: Self-pay | Admitting: *Deleted

## 2022-06-02 ENCOUNTER — Ambulatory Visit: Payer: Medicare Other

## 2022-06-02 ENCOUNTER — Ambulatory Visit
Admission: RE | Admit: 2022-06-02 | Discharge: 2022-06-02 | Disposition: A | Discharge status: Home / Self Care | Payer: Medicare Other | Source: Ambulatory Visit | Attending: Radiation Oncology | Admitting: Radiation Oncology

## 2022-06-02 ENCOUNTER — Ambulatory Visit: Payer: Medicare Other | Admitting: Dermatology

## 2022-06-02 ENCOUNTER — Other Ambulatory Visit: Payer: Self-pay | Admitting: *Deleted

## 2022-06-02 DIAGNOSIS — Z510 Encounter for antineoplastic radiation therapy: Secondary | ICD-10-CM | POA: Insufficient documentation

## 2022-06-02 DIAGNOSIS — Z170 Estrogen receptor positive status [ER+]: Secondary | ICD-10-CM | POA: Insufficient documentation

## 2022-06-02 DIAGNOSIS — C50811 Malignant neoplasm of overlapping sites of right female breast: Secondary | ICD-10-CM | POA: Insufficient documentation

## 2022-06-03 ENCOUNTER — Telehealth: Payer: Self-pay

## 2022-06-03 ENCOUNTER — Ambulatory Visit (INDEPENDENT_AMBULATORY_CARE_PROVIDER_SITE_OTHER): Payer: Medicare Other | Admitting: Family Medicine

## 2022-06-03 ENCOUNTER — Ambulatory Visit: Payer: Medicare Other

## 2022-06-03 ENCOUNTER — Encounter: Payer: Self-pay | Admitting: Family Medicine

## 2022-06-03 VITALS — BP 136/70 | HR 80 | Temp 97.9°F | Resp 17 | Ht 66.0 in | Wt 179.4 lb

## 2022-06-03 DIAGNOSIS — I2699 Other pulmonary embolism without acute cor pulmonale: Secondary | ICD-10-CM

## 2022-06-03 DIAGNOSIS — J189 Pneumonia, unspecified organism: Secondary | ICD-10-CM

## 2022-06-03 DIAGNOSIS — I10 Essential (primary) hypertension: Secondary | ICD-10-CM | POA: Diagnosis not present

## 2022-06-03 DIAGNOSIS — J4531 Mild persistent asthma with (acute) exacerbation: Secondary | ICD-10-CM

## 2022-06-03 DIAGNOSIS — E871 Hypo-osmolality and hyponatremia: Secondary | ICD-10-CM | POA: Diagnosis not present

## 2022-06-03 LAB — BASIC METABOLIC PANEL
BUN: 26 mg/dL — ABNORMAL HIGH (ref 6–23)
CO2: 26 mEq/L (ref 19–32)
Calcium: 9.9 mg/dL (ref 8.4–10.5)
Chloride: 98 mEq/L (ref 96–112)
Creatinine, Ser: 0.85 mg/dL (ref 0.40–1.20)
GFR: 65.2 mL/min (ref >60.00)
Glucose, Bld: 150 mg/dL — ABNORMAL HIGH (ref 70–99)
Potassium: 4.2 mEq/L (ref 3.5–5.1)
Sodium: 135 mEq/L (ref 135–145)

## 2022-06-03 NOTE — Assessment & Plan Note (Signed)
Lungs are clear today.  She has recovered from this.

## 2022-06-03 NOTE — Patient Instructions (Signed)
Nice to see you. You are advised to stay off of your hydrochlorothiazide due to low sodium levels.  Will recheck this today.  At this time please remain off of this. The pharmacist should contact you to set up an appointment to discuss coverage for your Eliquis.

## 2022-06-03 NOTE — Assessment & Plan Note (Addendum)
Chronic issue.  Adequately controlled today for her age.  Will continue to monitor this.  She will remain off of HCTZ.

## 2022-06-03 NOTE — Assessment & Plan Note (Signed)
Recheck BMP today.  

## 2022-06-03 NOTE — Assessment & Plan Note (Addendum)
Patient will remain on Eliquis 5 mg twice daily after completing her 10 mg twice daily dosing.  She will be treated for 6 months.  She will follow-up with hematology and pulmonology as well.  I will refer her to clinical pharmacy to see if they can help with the cost of Eliquis.

## 2022-06-03 NOTE — Patient Outreach (Signed)
  Care Coordination [facility] Note Transition Care Management Follow-up Telephone Call Date of discharge and from where: [facility] 05/31/2022 How have you been since you were released from the hospital? Per patients spouse, she is doing well and is resting after her PCP appointment. Any questions or concerns? No  Items Reviewed: Did the pt receive and understand the discharge instructions provided? Yes  Medications obtained and verified? Yes  Other? No  Any new allergies since your discharge? No  Dietary orders reviewed? No Do you have support at home? Yes   Home Care and Equipment/Supplies: Were home health services ordered? yes If so, what is the name of the agency? [facility]  Has the agency set up a time to come to the patient's home? no Were any new equipment or medical supplies ordered?  No What is the name of the medical supply agency? no Were you able to get the supplies/equipment? no Do you have any questions related to the use of the equipment or supplies? No  Functional Questionnaire: (I = Independent and D = Dependent) ADLs: I  Bathing/Dressing- I  Meal Prep- I  Eating- I  Maintaining continence- I  Transferring/Ambulation- I  Managing Meds- I  Follow up appointments reviewed:  PCP Hospital f/u appt confirmed? Yes  Scheduled to see Dr. Caryl Bis on 06/03/22 @ 11:00. [facility] f/u appt confirmed? Yes  Scheduled to see Dr. Rogue Bussing on 06/10/22 @ 3:30. Are transportation arrangements needed? No  If their condition worsens, is the pt aware to call PCP or go to the Emergency Dept.? Yes Was the patient provided with contact information for the PCP's office or ED? Yes Was to pt encouraged to call back with questions or concerns? Yes  SDOH assessments and interventions completed:   Yes SDOH Interventions Today    Flowsheet Row Most Recent Value  SDOH Interventions   Food Insecurity Interventions Intervention Not Indicated  Housing Interventions Intervention Not  Indicated       Care Coordination Interventions:  No Care Coordination interventions needed at this time.   Encounter Outcome:  Pt. Visit Completed

## 2022-06-03 NOTE — Progress Notes (Signed)
Tommi Rumps, MD Phone: [phone]  Sabrina Wilson is a 82 y.o. female who presents today for follow-up.  Pulmonary embolus: Patient was found to have a pulmonary embolus and DVT on 2/2.  She had some back pain that led to an ED evaluation.  She was initially treated with heparin and then transitioned to Eliquis.  She was also treated for an asthma exacerbation with steroids and also possible pneumonia given abnormal findings on chest x-ray.  She was treated with antibiotics for that.  She notes she still has 2 days of antibiotics left.  She notes no chest pain, shortness of breath at this time.  She has some swelling at night though goes down overnight.  She notes chronic issues with wheezing and coughing.  She still feels weak overall though is feeling significantly better.  She followed up with pulmonology on 2/7.  They recommended Eliquis for 6 months.  She notes they plan to do further evaluation for COPD at 34-monthfollow-up.  She sees her hematologist/oncologist next week.  She did have hyponatremia and they stopped her HCTZ.  Pneumonia/COVID: Patient was also hospitalized for pneumonia and severe sepsis related to COVID earlier in January.  She was treated with Paxlovid.  Social History   Tobacco Use  Smoking Status Former   Types: Cigarettes   Quit date: 10/12/2007   Years since quitting: 14.6  Smokeless Tobacco Never    Current Outpatient Medications on File Prior to Visit  Medication Sig Dispense Refill   albuterol (VENTOLIN HFA) 108 (90 Base) MCG/ACT inhaler Inhale 2 puffs into the lungs every 6 (six) hours as needed for wheezing or shortness of breath. 8 g 2   amoxicillin-clavulanate (AUGMENTIN) 875-125 MG tablet Take 1 tablet by mouth 2 (two) times daily for 4 days. 8 tablet 0   apixaban (ELIQUIS) 5 MG TABS tablet Two tabs po twice a day for five days then 1 tab po twice a day afterwards 70 tablet 0   atorvastatin (LIPITOR) 10 MG tablet Take 1 tablet (10 mg total) by  mouth daily. 90 tablet 1   Calcium Carbonate-Vit D-Min (CALCIUM 1200 PO) Take 1 tablet by mouth daily.     feeding supplement (ENSURE ENLIVE / ENSURE PLUS) LIQD Take 237 mLs by mouth 2 (two) times daily between meals. 14220 mL 0   guaiFENesin (MUCINEX) 600 MG 12 hr tablet Take 1 tablet (600 mg total) by mouth 2 (two) times daily as needed for cough or to loosen phlegm. 10 tablet 0   HYDROcodone-acetaminophen (NORCO/VICODIN) 5-325 MG tablet Take 1 tablet by mouth every 6 (six) hours as needed for severe pain. 10 tablet 0   niacinamide 500 MG tablet Take 500 mg by mouth 2 (two) times daily.     Omega-3 Fatty Acids (FISH OIL PO) Take 1 capsule by mouth daily.     ondansetron (ZOFRAN) 8 MG tablet One pill every 8 hours as needed for nausea/vomitting. 40 tablet 1   predniSONE (DELTASONE) 20 MG tablet Two tabs po daily for three days 6 tablet 0   prochlorperazine (COMPAZINE) 10 MG tablet Take 1 tablet (10 mg total) by mouth every 6 (six) hours as needed for nausea or vomiting. 40 tablet 1   Ruxolitinib Phosphate (OPZELURA) 1.5 % CREA Apply 1 application topically daily. (Patient taking differently: Apply 1 application  topically daily. LEGS) 60 g 2   umeclidinium bromide (INCRUSE ELLIPTA) 62.5 MCG/ACT AEPB Inhale 1 puff into the lungs daily. 30 each 0   VITAMIN D, CHOLECALCIFEROL,  PO Take 2,000 Units by mouth daily.     No current facility-administered medications on file prior to visit.     ROS see history of present illness  Objective  Physical Exam Vitals:   06/03/22 1047  BP: 136/70  Pulse: 80  Resp: 17  Temp: 97.9 F (36.6 C)  SpO2: 99%    BP Readings from Last 3 Encounters:  06/03/22 136/70  05/31/22 (!) 109/58  05/17/22 132/74   Wt Readings from Last 3 Encounters:  06/03/22 179 lb 6.4 oz (81.4 kg)  05/27/22 179 lb 10.8 oz (81.5 kg)  05/12/22 177 lb (80.3 kg)    Physical Exam Constitutional:      General: She is not in acute distress.    Appearance: She is not  diaphoretic.  Cardiovascular:     Rate and Rhythm: Normal rate and regular rhythm.     Heart sounds: Normal heart sounds.  Pulmonary:     Effort: Pulmonary effort is normal.     Breath sounds: Normal breath sounds.  Musculoskeletal:     Comments: 1+ pitting edema bilateral lower legs  Skin:    General: Skin is warm and dry.  Neurological:     Mental Status: She is alert.      Assessment/Plan: Please see individual problem list.  Hyponatremia Assessment & Plan: Recheck BMP today.  Orders: -     Basic metabolic panel  Other acute pulmonary embolism without acute cor pulmonale (HCC) Assessment & Plan: Patient will remain on Eliquis 5 mg twice daily after completing her 10 mg twice daily dosing.  She will be treated for 6 months.  She will follow-up with hematology and pulmonology as well.  I will refer her to clinical pharmacy to see if they can help with the cost of Eliquis.  Orders: -     AMB Referral to Pharmacy Medication Management  Primary hypertension Assessment & Plan: Chronic issue.  Adequately controlled today for her age.  Will continue to monitor this.  She will remain off of HCTZ.   Pneumonia of both lower lobes due to infectious organism Assessment & Plan: Lungs are clear today.  She has recovered from this.   Mild persistent asthmatic bronchitis with acute exacerbation Assessment & Plan: Chronic issue.  She will complete her course of Augmentin and prednisone.     Return in about 3 months (around 09/01/2022).   Tommi Rumps, MD [place]

## 2022-06-03 NOTE — Assessment & Plan Note (Addendum)
Chronic issue.  She will complete her course of Augmentin and prednisone.

## 2022-06-04 ENCOUNTER — Other Ambulatory Visit: Payer: Self-pay

## 2022-06-06 ENCOUNTER — Telehealth: Payer: Self-pay

## 2022-06-06 ENCOUNTER — Ambulatory Visit: Payer: Medicare Other

## 2022-06-06 NOTE — Progress Notes (Signed)
   Care Guide Note  06/06/2022 Name: Daniqua Campoy MRN: [id number] DOB: 07/10/1940  Referred by: Leone Haven, MD Reason for referral : Care Coordination (Outreach to schedule with Pharm d )   Sabrina Wilson is a 82 y.o. year old female who is a primary care patient of Caryl Bis, Angela Adam, MD. Taren Dymek was referred to the pharmacist for assistance related to HTN.    Successful contact was made with the patient to discuss pharmacy services including being ready for the pharmacist to call at least 5 minutes before the scheduled appointment time, to have medication bottles and any blood sugar or blood pressure readings ready for review. The patient agreed to meet with the pharmacist via with the pharmacist via telephone visit on (date/time).  06/07/2022  [address], [place], [place] [address] Direct Dial: [phone] [email or web address]

## 2022-06-07 ENCOUNTER — Other Ambulatory Visit: Payer: Medicare Other

## 2022-06-07 ENCOUNTER — Ambulatory Visit: Payer: Medicare Other

## 2022-06-07 DIAGNOSIS — Z170 Estrogen receptor positive status [ER+]: Secondary | ICD-10-CM | POA: Diagnosis not present

## 2022-06-07 DIAGNOSIS — C50811 Malignant neoplasm of overlapping sites of right female breast: Secondary | ICD-10-CM | POA: Diagnosis not present

## 2022-06-07 DIAGNOSIS — Z510 Encounter for antineoplastic radiation therapy: Secondary | ICD-10-CM | POA: Diagnosis not present

## 2022-06-07 NOTE — Progress Notes (Signed)
    06/07/2022 Name: Sabrina Wilson MRN: [id number] DOB: 1940-08-19  Chief Complaint  Patient presents with   Medication Management   Sabrina Wilson is a 82 y.o. year old female who presented for a telephone visit.   They were referred to the pharmacist by their PCP for assistance in managing medication access.   Patient is participating in a Managed Medicaid Plan:  No  Subjective:  PCP referral for patient assistance with [place].  Patient recently diagnosed with PE, and was able to get first fill at no charge with manufacturer coupon; but her copay is $45/month.  Care Team: Primary Care Provider: Leone Haven, MD ; Next Scheduled Visit: 06/15/22  Medication Access/Adherence  Current Pharmacy:  [facility] [id number], [place] [address] Phone: [phone] Fax: [phone]  Patient reports affordability concerns with their medications: Yes  Patient reports access/transportation concerns to their pharmacy: No  Patient reports adherence concerns with their medications:  No  Currently has Eliquis on hand  Objective:  Lab Results  Component Value Date   HGBA1C 5.8 (A) 11/24/2021   Lab Results  Component Value Date   CREATININE 0.85 06/03/2022   BUN 26 (H) 06/03/2022   NA 135 06/03/2022   K 4.2 06/03/2022   CL 98 06/03/2022   CO2 26 06/03/2022   Lab Results  Component Value Date   CHOL 201 (H) 05/24/2021   HDL 110.70 05/24/2021   LDLCALC 77 05/24/2021   LDLDIRECT 66.0 09/08/2017   TRIG 65.0 05/24/2021   CHOLHDL 2 05/24/2021   Assessment/Plan:  Reviewed and updated current medication list. Medication Management: - Currently strategy sufficient to maintain appropriate adherence to prescribed medication regimen - Patient endorses being able to afford $45/month Eliquis if necessary, but does state patient assistance would be helpful - From household income perspective, she would qualify; but I  did inform her of the 3% income spend requirement.  She understands based on this, she may not be approved but would like to try. - If not approved, I informed patient she can reach out to her Medicare Part D plan and request a cost reduction; but this could lead to reaching the donut hole sooner in the year. - Will forward chart to rx med assistance pool to help with manufacturer assistance application.  Follow Up Plan: If patient is not approved, I will reach out to further discuss cost reduction option on Medicare.  I am also checking on duration of Incruse, as she would likely benefit from patient assistance with this.  Darlina Guys, PharmD, DPLA

## 2022-06-08 ENCOUNTER — Encounter: Payer: Self-pay | Admitting: Internal Medicine

## 2022-06-08 ENCOUNTER — Ambulatory Visit: Payer: Medicare Other

## 2022-06-08 ENCOUNTER — Inpatient Hospital Stay: Payer: Medicare Other

## 2022-06-08 ENCOUNTER — Inpatient Hospital Stay: Payer: Medicare Other | Attending: Internal Medicine | Admitting: Internal Medicine

## 2022-06-08 DIAGNOSIS — Z170 Estrogen receptor positive status [ER+]: Secondary | ICD-10-CM | POA: Insufficient documentation

## 2022-06-08 DIAGNOSIS — Z86718 Personal history of other venous thrombosis and embolism: Secondary | ICD-10-CM | POA: Diagnosis not present

## 2022-06-08 DIAGNOSIS — C50811 Malignant neoplasm of overlapping sites of right female breast: Secondary | ICD-10-CM

## 2022-06-08 DIAGNOSIS — Z85038 Personal history of other malignant neoplasm of large intestine: Secondary | ICD-10-CM | POA: Insufficient documentation

## 2022-06-08 DIAGNOSIS — M549 Dorsalgia, unspecified: Secondary | ICD-10-CM | POA: Diagnosis not present

## 2022-06-08 DIAGNOSIS — Z9221 Personal history of antineoplastic chemotherapy: Secondary | ICD-10-CM | POA: Diagnosis not present

## 2022-06-08 DIAGNOSIS — Z86711 Personal history of pulmonary embolism: Secondary | ICD-10-CM | POA: Insufficient documentation

## 2022-06-08 DIAGNOSIS — Z800 Family history of malignant neoplasm of digestive organs: Secondary | ICD-10-CM | POA: Diagnosis not present

## 2022-06-08 LAB — COMPREHENSIVE METABOLIC PANEL
ALT: 26 U/L (ref 0–44)
AST: 18 U/L (ref 15–41)
Albumin: 3.3 g/dL — ABNORMAL LOW (ref 3.5–5.0)
Alkaline Phosphatase: 57 U/L (ref 38–126)
Anion gap: 8 (ref 5–15)
BUN: 15 mg/dL (ref 8–23)
CO2: 27 mmol/L (ref 22–32)
Calcium: 9.7 mg/dL (ref 8.9–10.3)
Chloride: 100 mmol/L (ref 98–111)
Creatinine, Ser: 0.76 mg/dL (ref 0.44–1.00)
GFR, Estimated: >60 mL/min (ref >60)
Glucose, Bld: 113 mg/dL — ABNORMAL HIGH (ref 70–99)
Potassium: 4.4 mmol/L (ref 3.5–5.1)
Sodium: 135 mmol/L (ref 135–145)
Total Bilirubin: 0.6 mg/dL (ref 0.3–1.2)
Total Protein: 6.2 g/dL — ABNORMAL LOW (ref 6.5–8.1)

## 2022-06-08 LAB — CBC WITH DIFFERENTIAL/PLATELET
Abs Immature Granulocytes: 0.02 10*3/uL (ref 0.00–0.07)
Basophils Absolute: 0 10*3/uL (ref 0.0–0.1)
Basophils Relative: 0 %
Eosinophils Absolute: 0.4 10*3/uL (ref 0.0–0.5)
Eosinophils Relative: 6 %
HCT: 32.1 % — ABNORMAL LOW (ref 36.0–46.0)
Hemoglobin: 10.3 g/dL — ABNORMAL LOW (ref 12.0–15.0)
Immature Granulocytes: 0 %
Lymphocytes Relative: 16 %
Lymphs Abs: 1.2 10*3/uL (ref 0.7–4.0)
MCH: 32.3 pg (ref 26.0–34.0)
MCHC: 32.1 g/dL (ref 30.0–36.0)
MCV: 100.6 fL — ABNORMAL HIGH (ref 80.0–100.0)
Monocytes Absolute: 0.8 10*3/uL (ref 0.1–1.0)
Monocytes Relative: 11 %
Neutro Abs: 4.9 10*3/uL (ref 1.7–7.7)
Neutrophils Relative %: 67 %
Platelets: 303 10*3/uL (ref 150–400)
RBC: 3.19 MIL/uL — ABNORMAL LOW (ref 3.87–5.11)
RDW: 15.6 % — ABNORMAL HIGH (ref 11.5–15.5)
WBC: 7.3 10*3/uL (ref 4.0–10.5)
nRBC: 0 % (ref 0.0–0.2)

## 2022-06-08 LAB — MAGNESIUM: Magnesium: 2.1 mg/dL (ref 1.7–2.4)

## 2022-06-08 NOTE — Patient Instructions (Signed)
#   Recommend taking Tylenol 1 pill every 8 hours for the next 3 days.  If not improved call us back.

## 2022-06-08 NOTE — Progress Notes (Signed)
[facility] NOTE  Patient Care Team: Leone Haven, MD as PCP - General (Family Medicine) Kate Sable, MD as PCP - Cardiology (Cardiology) Bary Castilla Forest Gleason, MD as Consulting Physician (General Surgery) Dallas Schimke, MD (Internal Medicine) Daiva Huge, RN as Oncology Nurse Navigator  CHIEF COMPLAINTS/PURPOSE OF CONSULTATION: Breast cancer  #  Oncology History Overview Note  # 2009- Sigmoid colon cancer stage II (T3 N0 4 lymph nodes sampled M0 stage II high risk there was obstruction and perforation abscess; Dr.Hern), workup included a low CEA preoperatively and a chest x-ray and CT scan abdomen pelvis negative for metastatic disease, S/P colostomy; s/p FOLFOX x 6 months. [Dr.Gittin ]  # LATER REVERSED , K RAS  wild type B RAF not evaluated   Also initial iron deficiency with anemia thrombocytosis, high platelets considered reactive, workup includes a normal PCR for CML and a negative [id number] mutation  DIAGNOSIS:  A. BREAST, RIGHT; LUMPECTOMY:  - INVASIVE MAMMARY CARCINOMA.  - DUCTAL CARCINOMA IN SITU (DCIS).  - SEE CANCER SUMMARY BELOW.  - BIOPSY SITE CHANGE WITH CLIP (2).  - FIBROUS TISSUE WITH CYST FORMATION AND FLORID DUCTAL HYPERPLASIA.  - SKIN AND NIPPLE NEGATIVE FOR MALIGNANCY.  - VASCULAR CALCIFICATION.   B. SENTINEL LYMPH NODES 1 AND 2, RIGHT AXILLA; EXCISION:  - METASTATIC CARCINOMA INVOLVES ONE OF TWO LYMPH NODES (1/2).   C. NON-SENTINEL LYMPH NODES, RIGHT AXILLA; EXCISION:  - ONE LYMPH NODE NEGATIVE FOR MALIGNANCY (0/1).   CANCER CASE SUMMARY: INVASIVE CARCINOMA OF THE BREAST  Standard(s): AJCC-UICC 8   SPECIMEN  Procedure: Lumpectomy  Specimen Laterality: Right   TUMOR  Histologic Type: Invasive carcinoma of no special type (ductal)  Histologic Grade (Nottingham Histologic Score)       Glandular (Acinar)/Tubular Differentiation: 3       Nuclear Pleomorphism: 3       Mitotic Rate: 3       Overall Grade: 3  Tumor Size:  24 mm  Tumor Focality: Single focus of invasive carcinoma  Ductal Carcinoma In Situ (DCIS): Present, nuclear grade 2-3  Tumor Extent: Not applicable  Lymphatic and/or Vascular Invasion: Present  Treatment Effect in the Breast: No known presurgical therapy   MARGINS  Margin Status for Invasive Carcinoma: All margins negative for invasive  carcinoma       Distance from closest margin: 10 mm       Specify closest margin: Superior   Margin Status for DCIS: All margins negative for DCIS       Distance from DCIS to closest margin: 10 mm       Specify closest margin: Superior   REGIONAL LYMPH NODES  Regional Lymph Node Status: Tumor present in regional lymph node(s)       Number of Lymph Nodes with Macrometastases (greater than 2 mm): 1       Number of Lymph Nodes with Micrometastases (greater than 0.2 mm to  2 mm and/or greater than 200 cells): 0       Number of Lymph Nodes with Isolated Tumor Cells (0.2 mm or less OR  200 cells or less): 0       Size of Largest Metastatic Deposit: 3 mm      Extranodal Extension: Not identified       Total Number of Lymph Nodes Examined (sentinel and non-sentinel): 3       Number of Sentinel Nodes Examined: 2   DISTANT METASTASIS  Distant Site(s) Involved, if applicable: Not applicable  PATHOLOGIC STAGE CLASSIFICATION (pTNM, AJCC 8th Edition):  Modified Classification: Not applicable  pT Category: pT2  T Suffix: Not applicable  pN Category: pN1a  N Suffix: (sn)  pM Category: Not applicable   SPECIAL STUDIES  Breast Biomarker Testing Performed on Previous Outside Biopsy:  23-250-T11  Per outside report:  Estrogen Receptor (ER) Status: POSITIVE          Percentage of cells with nuclear positivity: Greater than 90%          Average intensity of staining: Strong   Progesterone Receptor (PgR) Status: POSITIVE          Percentage of cells with nuclear positivity: 60%          Average intensity of staining: Strong   HER2 (by  immunohistochemistry): EQUIVOCAL (Score 2+)  HER2 FISH: NEGATIVE   Ki-67: Not performed   # Cytoxan Taxotere x 4 cycles; finished JAN, 2024. [Neutropenic fevers s/p cycle #4;  -FEB 2nd, 2024-right lung PE; left lower LE  DVT- on eliquis.   # FEB 19th-start RT  IMPRESSION: 1. Complex cystic and solid-appearing mass within the subareolar RIGHT breast, overall measuring 3.1 cm greatest dimension, the solid enhancing component at the anterior-lateral aspect of the mass measuring 2 x 1 cm, abutting the overlying skin but without evidence of involvement/enhancement of the skin. Susceptibility artifact within the mass may represent a biopsy clip (if biopsy has been performed at an outside site since the diagnostic mammogram/ultrasound of 12/28/2021) or this artifact could represent calcifications within the mass. This mass corresponds to the recent ultrasound finding of a complex irregular mass at the 11 o'clock axis of the retroareolar RIGHT breast. 2. No evidence of multifocal or multicentric disease within the RIGHT breast. 3. No evidence of contralateral disease in the LEFT breast.   RECOMMENDATION: If has not already been performed, recommend ultrasound-guided biopsy of the suspicious mass in the subareolar RIGHT breast, with preferential targeting of the anterior-lateral component of the mass which is shown to be the enhancing component on today's MRI.   BI-RADS CATEGORY  4: Suspicious.     Cancer of sigmoid ([place]) (Resolved)  Carcinoma of overlapping sites of right breast in female, estrogen receptor positive ([place])  02/14/2022 Initial Diagnosis   Carcinoma of overlapping sites of right breast in female, estrogen receptor positive ([place])   02/14/2022 Cancer Staging   Staging form: Breast, AJCC 8th Edition - Pathologic: Stage IIA (pT2, pN1, cM0, G3, ER+, PR+, HER2-) - Signed by Cammie Sickle, MD on 02/14/2022 Histologic grading system: 3 grade system   02/23/2022 -   Chemotherapy   Patient is on Treatment Plan : BREAST TC q21d        HISTORY OF PRESENTING ILLNESS:   Alone.  Ambulating independently.   Lewanda Rife 82 y.o.  female patient with triple negative breast cancer stage II -currently s/p adjuvant chemotherapy with Taxotere + Cytoxan is here for follow-up.  Patient's course was complicated by DVT/PE needing admission to the hospital.  Prior to that patient was admitted hospital neutropenic fever sepsis after cycle #4.  Patient's back pain was gone by the time she left the hospital. This past Saturday pain came back with such intensity, the pain is so sharp and stabbing if she turns over or takes a deep breath. No pain sitting in the chair. Slight edema in bil ankles elevating helps . Taking eliquis for PE and DVT in her leg. Appetite is good. Having 4-6 soft mushy stools per day  Patient states she is having some shortness of breathe. Patient states she is also having some mucus.   Review of Systems  Constitutional:  Negative for chills, diaphoresis, fever, malaise/fatigue and weight loss.  HENT:  Negative for nosebleeds and sore throat.   Eyes:  Negative for double vision.  Respiratory:  Negative for cough, hemoptysis, sputum production, shortness of breath and wheezing.   Cardiovascular:  Negative for chest pain, palpitations, orthopnea and leg swelling.  Gastrointestinal:  Negative for abdominal pain, blood in stool, constipation, diarrhea, heartburn, melena, nausea and vomiting.  Genitourinary:  Negative for dysuria, frequency and urgency.  Musculoskeletal:  Negative for back pain and joint pain.  Skin: Negative.  Negative for itching and rash.  Neurological:  Negative for dizziness, tingling, focal weakness, weakness and headaches.  Endo/Heme/Allergies:  Does not bruise/bleed easily.  Psychiatric/Behavioral:  Negative for depression. The patient is not nervous/anxious and does not have insomnia.      MEDICAL HISTORY:  Past Medical  History:  Diagnosis Date   Actinic keratosis 02/12/2020   R lat calf (hypertrophic)   Arthritis    Cancer of sigmoid ([place]) 06/17/2016   Colon cancer ([place]) 2009   T3,N0; s/p resection  Dr. Hulda Humphrey and chemotherapy by Dr. Cynda Acres   Diastolic dysfunction    a. 08/2020 Echo: EF 60-65%, no rwma, Gr1 DD, nl RV size/fxn. Mild MR.   Hernia of flank    History of actinic keratoses 08/11/2020   Bx proven at left lateral ankle proximal, LN2 10/08/20   History of stress test    a. 09/2020 MV: EF >65%. No ischemia/infarct. Mild Ao Ca2+ and minimal Cor Ca2+.   Hypertension    Neuropathy    Polyp at cervical os    s/p resection Dr. Sabra Heck   Skin cancer 01/22/2020   surface of an atypical verrucous squamous proliferation    Squamous cell carcinoma of skin 07/16/2020   Left lateral ankle anterior, treated with [facility] 08-11-2020    SURGICAL HISTORY: Past Surgical History:  Procedure Laterality Date   BREAST BIOPSY Right 05/2014   Dr. Dwyane Luo office-benign   BREAST LUMPECTOMY WITH SENTINEL LYMPH NODE BIOPSY Right 01/21/2022   Procedure: BREAST LUMPECTOMY WITH SENTINEL LYMPH NODE BX;  Surgeon: Robert Bellow, MD;  Location: [facility] ORS;  Service: General;  Laterality: Right;   BREAST SURGERY Right February 2016   Vacuum assisted biopsy for recurrent cyst, fibrocystic changes without evidence of malignancy.   CATARACT EXTRACTION W/PHACO Left 01/13/2015   Procedure: CATARACT EXTRACTION PHACO AND INTRAOCULAR LENS PLACEMENT (IOC);  Surgeon: Birder Robson, MD;  Location: [facility] ORS;  Service: Ophthalmology;  Laterality: Left;  [place]  1:02.9 AP   19.2 CDE  12.06 casette lot #  [id number]   CATARACT EXTRACTION W/PHACO Right 02/03/2015   Procedure: CATARACT EXTRACTION PHACO AND INTRAOCULAR LENS PLACEMENT (IOC);  Surgeon: Birder Robson, MD;  Location: [facility] ORS;  Service: Ophthalmology;  Laterality: Right;  [id number]:53 ap46.5 [id number]   COLECTOMY     COLONOSCOPY  2015   Dr Candace Cruise   COLONOSCOPY WITH PROPOFOL N/A  09/13/2017   Procedure: COLONOSCOPY WITH PROPOFOL;  Surgeon: Robert Bellow, MD;  Location: [facility] ENDOSCOPY;  Service: Endoscopy;  Laterality: N/A;   COLONOSCOPY WITH PROPOFOL N/A 11/08/2017   Procedure: COLONOSCOPY WITH PROPOFOL;  Surgeon: Robert Bellow, MD;  Location: [facility] ENDOSCOPY;  Service: Endoscopy;  Laterality: N/A;   colostomy reversal     DILATION AND CURETTAGE OF UTERUS  2014   Dr. Sabra Heck, benign per pt  EYE SURGERY     HERNIA REPAIR  07/13/12   ventral    SKIN CANCER EXCISION     TONSILLECTOMY      SOCIAL HISTORY: Social History   Socioeconomic History   Marital status: Married    Spouse name: Not on file   Number of children: Not on file   Years of education: Not on file   Highest education level: Not on file  Occupational History   Not on file  Tobacco Use   Smoking status: Former    Types: Cigarettes    Quit date: 10/12/2007    Years since quitting: 14.6   Smokeless tobacco: Never  Vaping Use   Vaping Use: Never used  Substance and Sexual Activity   Alcohol use: Yes    Alcohol/week: 1.0 standard drink of alcohol    Types: 1 Standard drinks or equivalent per week    Comment: with dinner GLASS OF WINE EACH DAY   Drug use: No   Sexual activity: Not on file  Other Topics Concern   Not on file  Social History Narrative   Lives in [place] with husband. 2 children, son lives nearby.Diet - regularExercise - none. 30 mins/in [place]. Quit smoking in 2009. Wine before dinner. Pianist in church; real estate; Adult nurse.    Social Determinants of Health   Financial Resource Strain: Low Risk  (05/19/2022)   Overall Financial Resource Strain (CARDIA)    Difficulty of Paying Living Expenses: Not very hard  Food Insecurity: No Food Insecurity (06/03/2022)   Hunger Vital Sign    Worried About Running Out of Food in the Last Year: Never true    Ran Out of Food in the Last Year: Never true  Transportation Needs: No Transportation Needs (05/28/2022)   PRAPARE  - Hydrologist (Medical): No    Lack of Transportation (Non-Medical): No  Physical Activity: Not on file  Stress: No Stress Concern Present (11/16/2020)   [place]    Feeling of Stress : Not at all  Social Connections: Not on file  Intimate Partner Violence: Not At Risk (05/28/2022)   Humiliation, Afraid, Rape, and Kick questionnaire    Fear of Current or Ex-Partner: No    Emotionally Abused: No    Physically Abused: No    Sexually Abused: No    FAMILY HISTORY: Family History  Problem Relation Age of Onset   Stroke Mother    Diabetes Mother    Cancer Father        colon   Stroke Sister    Cancer Brother        colon   Cancer Brother        brain    ALLERGIES:  is allergic to sulfa antibiotics.  MEDICATIONS:  Current Outpatient Medications  Medication Sig Dispense Refill   albuterol (VENTOLIN HFA) 108 (90 Base) MCG/ACT inhaler Inhale 2 puffs into the lungs every 6 (six) hours as needed for wheezing or shortness of breath. 8 g 2   apixaban (ELIQUIS) 5 MG TABS tablet Two tabs po twice a day for five days then 1 tab po twice a day afterwards 70 tablet 0   atorvastatin (LIPITOR) 10 MG tablet Take 1 tablet (10 mg total) by mouth daily. 90 tablet 1   Calcium Carbonate-Vit D-Min (CALCIUM 1200 PO) Take 1 tablet by mouth daily.     HYDROcodone-acetaminophen (NORCO/VICODIN) 5-325 MG tablet Take 1 tablet by mouth every 6 (  six) hours as needed for severe pain. 10 tablet 0   niacinamide 500 MG tablet Take 500 mg by mouth 2 (two) times daily.     Omega-3 Fatty Acids (FISH OIL PO) Take 1 capsule by mouth daily.     umeclidinium bromide (INCRUSE ELLIPTA) 62.5 MCG/ACT AEPB Inhale 1 puff into the lungs daily. 30 each 0   VITAMIN D, CHOLECALCIFEROL, PO Take 2,000 Units by mouth daily.     guaiFENesin (MUCINEX) 600 MG 12 hr tablet Take 1 tablet (600 mg total) by mouth 2 (two) times daily as needed for  cough or to loosen phlegm. (Patient not taking: Reported on 06/07/2022) 10 tablet 0   No current facility-administered medications for this visit.      Marland Kitchen  PHYSICAL EXAMINATION:   Vitals:   06/08/22 1101  BP: 127/88  Pulse: 78  Resp: 18  Temp: 97.8 F (36.6 C)  SpO2: 100%   Filed Weights   06/08/22 1101  Weight: 168 lb 14.4 oz (76.6 kg)    Physical Exam Vitals and nursing note reviewed.  HENT:     Head: Normocephalic and atraumatic.     Mouth/Throat:     Pharynx: Oropharynx is clear.  Eyes:     Extraocular Movements: Extraocular movements intact.     Pupils: Pupils are equal, round, and reactive to light.  Cardiovascular:     Rate and Rhythm: Normal rate and regular rhythm.  Pulmonary:     Comments: Decreased breath sounds bilaterally.  Abdominal:     Palpations: Abdomen is soft.  Musculoskeletal:        General: Normal range of motion.     Cervical back: Normal range of motion.  Skin:    General: Skin is warm.  Neurological:     General: No focal deficit present.     Mental Status: She is alert and oriented to person, place, and time.  Psychiatric:        Behavior: Behavior normal.        Judgment: Judgment normal.      LABORATORY DATA:  I have reviewed the data as listed Lab Results  Component Value Date   WBC 7.3 06/08/2022   HGB 10.3 (L) 06/08/2022   HCT 32.1 (L) 06/08/2022   MCV 100.6 (H) 06/08/2022   PLT 303 06/08/2022   Recent Labs    05/13/22 0420 05/14/22 0409 05/29/22 0929 05/30/22 0606 05/31/22 0410 06/03/22 1116 06/08/22 1031  NA 134*   < > 128* 131* 129* 135 135  K 3.1*   < > 3.8 3.4* 4.0 4.2 4.4  CL 100   < > 95* 96* 96* 98 100  CO2 24   < > '25 27 24 26 27  '$ GLUCOSE 263*   < > 108* 121* 225* 150* 113*  BUN 10   < > '14 11 16 '$ 26* 15  CREATININE 0.90   < > 0.69 0.66 0.75 0.85 0.76  CALCIUM 8.8*   < > 9.0 9.0 9.3 9.9 9.7  GFRNONAA >60   < > >60 >60 >60  --  >60  PROT 5.8*  --  5.7*  --   --   --  6.2*  ALBUMIN 2.9*  --  3.0*   --   --   --  3.3*  AST 42*  --  12*  --   --   --  18  ALT 15  --  14  --   --   --  26  ALKPHOS 78  --  47  --   --   --  57  BILITOT 0.4  --  1.1  --   --   --  0.6   < > = values in this interval not displayed.    RADIOGRAPHIC STUDIES: I have personally reviewed the radiological images as listed and agreed with the findings in the report. DG Chest Port 1 View  Result Date: 05/30/2022 CLINICAL DATA:  Wheezing, asthmatic bronchitis with exacerbation, hypertension, former smoker EXAM: PORTABLE CHEST 1 VIEW COMPARISON:  Portable exam 1354 hours compared to 05/12/2022 FINDINGS: Normal heart size, mediastinal contours, and pulmonary vascularity. Increased markings RIGHT infrahilar question atelectasis versus infiltrate. Remaining lungs clear. No pleural effusion or pneumothorax. Osseous structures unremarkable. IMPRESSION: Increased RIGHT infrahilar markings question atelectasis versus infiltrate at medial RIGHT lung base. Electronically Signed   By: Lavonia Dana M.D.   On: 05/30/2022 14:45   US Venous Img Lower Bilateral  Result Date: 05/27/2022 CLINICAL DATA:  PE EXAM: BILATERAL LOWER EXTREMITY VENOUS DOPPLER ULTRASOUND TECHNIQUE: Gray-scale sonography with graded compression, as well as color Doppler and duplex ultrasound were performed to evaluate the lower extremity deep venous systems from the level of the common femoral vein and including the common femoral, femoral, profunda femoral, popliteal and calf veins including the posterior tibial, peroneal and gastrocnemius veins when visible. The superficial great saphenous vein was also interrogated. Spectral Doppler was utilized to evaluate flow at rest and with distal augmentation maneuvers in the common femoral, femoral and popliteal veins. COMPARISON:  None Available. FINDINGS: RIGHT LOWER EXTREMITY Common Femoral Vein: No evidence of thrombus. Normal compressibility, respiratory phasicity and response to augmentation. Saphenofemoral Junction: No  evidence of thrombus. Normal compressibility and flow on color Doppler imaging. Profunda Femoral Vein: No evidence of thrombus. Normal compressibility and flow on color Doppler imaging. Femoral Vein: No evidence of thrombus. Normal compressibility, respiratory phasicity and response to augmentation. Popliteal Vein: No evidence of thrombus. Normal compressibility, respiratory phasicity and response to augmentation. Calf Veins: No evidence of thrombus. Normal compressibility and flow on color Doppler imaging. Superficial Great Saphenous Vein: No evidence of thrombus. Normal compressibility. Venous Reflux:  None. Other Findings:  None. LEFT LOWER EXTREMITY Common Femoral Vein: No evidence of thrombus. Normal compressibility, respiratory phasicity and response to augmentation. Saphenofemoral Junction: No evidence of thrombus. Normal compressibility and flow on color Doppler imaging. Profunda Femoral Vein: No evidence of thrombus. Normal compressibility and flow on color Doppler imaging. Femoral Vein: No evidence of thrombus. Normal compressibility, respiratory phasicity and response to augmentation. Popliteal Vein: There is nonocclusive thrombus within the popliteal vein. Calf Veins: There is nonocclusive thrombus within the posterior tibial and peroneal veins. Superficial Great Saphenous Vein: No evidence of thrombus. Normal compressibility. Venous Reflux:  None. Other Findings:  None. IMPRESSION: 1. Nonocclusive thrombus within the left popliteal, posterior tibial and peroneal veins. 2. No evidence of deep venous thrombosis in the right lower extremity. Electronically Signed   By: Ronney Asters M.D.   On: 05/27/2022 21:54   CT HEAD WO CONTRAST (5MM)  Result Date: 05/27/2022 CLINICAL DATA:  Metastatic disease evaluation EXAM: CT HEAD WITHOUT CONTRAST TECHNIQUE: Contiguous axial images were obtained from the base of the skull through the vertex without intravenous contrast. RADIATION DOSE REDUCTION: This exam was  performed according to the departmental dose-optimization program which includes automated exposure control, adjustment of the mA and/or kV according to patient size and/or use of iterative reconstruction technique. COMPARISON:  None Available. FINDINGS: Brain: There is no mass, hemorrhage or extra-axial collection.  The size and configuration of the ventricles and extra-axial CSF spaces are normal. The brain parenchyma is normal, without acute or chronic infarction. No enhancing lesions. Vascular: Atherosclerotic calcification of the vertebral and internal carotid arteries at the skull base. No abnormal hyperdensity of the major intracranial arteries or dural venous sinuses. Skull: The visualized skull base, calvarium and extracranial soft tissues are normal. Sinuses/Orbits: No fluid levels or advanced mucosal thickening of the visualized paranasal sinuses. No mastoid or middle ear effusion. The orbits are normal. IMPRESSION: 1. No acute intracranial abnormality. No enhancing lesions. 2. Atherosclerotic calcification of the vertebral and internal carotid arteries at the skull base. Electronically Signed   By: Ulyses Jarred M.D.   On: 05/27/2022 20:51   CT ABDOMEN PELVIS W CONTRAST  Result Date: 05/27/2022 CLINICAL DATA:  Right lower back pain since yesterday EXAM: CT ABDOMEN AND PELVIS WITH CONTRAST TECHNIQUE: Multidetector CT imaging of the abdomen and pelvis was performed using the standard protocol following bolus administration of intravenous contrast. RADIATION DOSE REDUCTION: This exam was performed according to the departmental dose-optimization program which includes automated exposure control, adjustment of the mA and/or kV according to patient size and/or use of iterative reconstruction technique. CONTRAST:  156m OMNIPAQUE IOHEXOL 350 MG/ML SOLN COMPARISON:  05/12/2022 06/12/2012 FINDINGS: Lower chest: Please see separately reported examination of the chest. Hepatobiliary: No solid liver abnormality is  seen. Gallstones and sludge. No gallbladder wall thickening, or biliary dilatation. Pancreas: Unremarkable. No pancreatic ductal dilatation or surrounding inflammatory changes. Spleen: Normal in size without significant abnormality. Adrenals/Urinary Tract: Adrenal glands are unremarkable. Simple, benign renal cortical cysts, for which no further follow-up or characterization is required. Kidneys are otherwise normal, without renal calculi, solid lesion, or hydronephrosis. Bladder is unremarkable. Stomach/Bowel: Stomach is within normal limits. Appendix appears normal. No evidence of bowel wall thickening, distention, or inflammatory changes. Large burden of stool in the proximal colon. Status post sigmoid colon resection and reanastomosis. Vascular/Lymphatic: Aortic atherosclerosis. No enlarged abdominal or pelvic lymph nodes. Reproductive: Calcified uterine fibroids. Right ovarian cyst, again unchanged compared to remote prior examination dated 2014 and definitively benign. No follow-up imaging recommended. Note: This recommendation does not apply to premenarchal patients and to those with increased risk (genetic, family history, elevated tumor markers or other high-risk factors) of ovarian cancer. Reference: JACR 2020 Feb; 17(2):248-254 Other: No abdominal wall hernia or abnormality. No ascites. Musculoskeletal: No acute or significant osseous findings. IMPRESSION: 1. No acute CT findings of the abdomen or pelvis to explain right lower quadrant pain. 2. Cholelithiasis without evidence of acute cholecystitis. 3. Large burden of stool in the proximal colon. Aortic Atherosclerosis (ICD10-I70.0). Electronically Signed   By: ADelanna AhmadiM.D.   On: 05/27/2022 20:26   CT Angio Chest PE W and/or Wo Contrast  Result Date: 05/27/2022 CLINICAL DATA:  PE suspected EXAM: CT ANGIOGRAPHY CHEST WITH CONTRAST TECHNIQUE: Multidetector CT imaging of the chest was performed using the standard protocol during bolus administration  of intravenous contrast. Multiplanar CT image reconstructions and MIPs were obtained to evaluate the vascular anatomy. RADIATION DOSE REDUCTION: This exam was performed according to the departmental dose-optimization program which includes automated exposure control, adjustment of the mA and/or kV according to patient size and/or use of iterative reconstruction technique. CONTRAST:  1059mOMNIPAQUE IOHEXOL 350 MG/ML SOLN COMPARISON:  05/12/2022 FINDINGS: Cardiovascular: Satisfactory opacification of the pulmonary arteries to the segmental level. Positive examination for pulmonary embolism, with new segmental to subsegmental embolus present in the right lower lobe (series 6, image 261).  Normal heart size. RV LV ratio 0.9. Left and right coronary artery calcifications. No pericardial effusion. Aortic atherosclerosis. Mediastinum/Nodes: No enlarged mediastinal, hilar, or axillary lymph nodes. Thyroid gland, trachea, and esophagus demonstrate no significant findings. Lungs/Pleura: Diffuse bilateral bronchial wall thickening. Background of fine centrilobular pulmonary nodules, most concentrated in the lung apices. Rounded subpleural ground-glass opacity of the dependent right lower lobe, distal to embolus (series 5, image 119). No pleural effusion or pneumothorax. Upper Abdomen: Please see separately reported examination of the abdomen and pelvis. Musculoskeletal: No chest wall abnormality. No acute osseous findings. Review of the MIP images confirms the above findings. IMPRESSION: 1. Positive examination for pulmonary embolism, with new segmental to subsegmental embolus present in the right lower lobe. 2. Rounded subpleural ground-glass opacity of the dependent right lower lobe, distal to embolus, most consistent with developing pulmonary infarct. 3. Diffuse bilateral bronchial wall thickening. 4. Background of fine centrilobular pulmonary nodules, most concentrated in the lung apices, consistent with smoking-related  respiratory bronchiolitis. 5. Coronary artery disease. These results were called by telephone at the time of interpretation on 05/27/2022 at 8:16 pm to Dr. Merlyn Lot , who verbally acknowledged these results. Aortic Atherosclerosis (ICD10-I70.0). Electronically Signed   By: Delanna Ahmadi M.D.   On: 05/27/2022 20:20   CT Abdomen Pelvis W Contrast  Result Date: 05/12/2022 CLINICAL DATA:  Abdominal pain EXAM: CT ABDOMEN AND PELVIS WITH CONTRAST TECHNIQUE: Multidetector CT imaging of the abdomen and pelvis was performed using the standard protocol following bolus administration of intravenous contrast. RADIATION DOSE REDUCTION: This exam was performed according to the departmental dose-optimization program which includes automated exposure control, adjustment of the mA and/or kV according to patient size and/or use of iterative reconstruction technique. CONTRAST:  172m OMNIPAQUE IOHEXOL 350 MG/ML SOLN COMPARISON:  06/12/2012 FINDINGS: Examination of the abdomen pelvis is generally limited by breath motion artifact. Lower chest: Please see separately reported examination of the chest. Hepatobiliary: No solid liver abnormality is seen. Tiny gallstones and or sludge (series 2, image 32). No gallbladder wall thickening, or biliary dilatation. Pancreas: Unremarkable. No pancreatic ductal dilatation or surrounding inflammatory changes. Spleen: Normal in size without significant abnormality. Adrenals/Urinary Tract: Adrenal glands are unremarkable. Kidneys are normal, without renal calculi, solid lesion, or hydronephrosis. Bladder is unremarkable. Stomach/Bowel: Stomach is within normal limits. Appendix appears normal. No evidence of bowel wall thickening, distention, or inflammatory changes. Evidence of prior sigmoid colon resection and reanastomosis. Vascular/Lymphatic: Aortic atherosclerosis. No enlarged abdominal or pelvic lymph nodes. Reproductive: Calcified uterine fibroids. Right ovarian cyst measuring 3.4 x  2.2 cm (series 2, image 67). This is unchanged compared to remote prior examination dated 2014 and definitively benign. No further follow-up or characterization is required. No follow-up imaging recommended. Note: This recommendation does not apply to premenarchal patients and to those with increased risk (genetic, family history, elevated tumor markers or other high-risk factors) of ovarian cancer. Reference: JACR 2020 Feb; 17(2):248-254 Other: No abdominal wall hernia or abnormality. No ascites. Musculoskeletal: No acute or significant osseous findings. IMPRESSION: 1. Examination of the abdomen and pelvis is generally limited by breath motion artifact. 2. Within this limitation, no acute CT findings of the abdomen or pelvis to explain abdominal pain. 3. Tiny gallstones and or sludge. No evidence of acute cholecystitis. 4. Calcified uterine fibroids. Aortic Atherosclerosis (ICD10-I70.0). Electronically Signed   By: ADelanna AhmadiM.D.   On: 05/12/2022 14:40   CT Angio Chest PE W/Cm &/Or Wo Cm  Result Date: 05/12/2022 CLINICAL DATA:  PE suspected, shortness of  breath increasing for 1 week EXAM: CT ANGIOGRAPHY CHEST WITH CONTRAST TECHNIQUE: Multidetector CT imaging of the chest was performed using the standard protocol during bolus administration of intravenous contrast. Multiplanar CT image reconstructions and MIPs were obtained to evaluate the vascular anatomy. RADIATION DOSE REDUCTION: This exam was performed according to the departmental dose-optimization program which includes automated exposure control, adjustment of the mA and/or kV according to patient size and/or use of iterative reconstruction technique. CONTRAST:  141m OMNIPAQUE IOHEXOL 350 MG/ML SOLN COMPARISON:  None Available. FINDINGS: Cardiovascular: Satisfactory opacification of the pulmonary arteries to the segmental level. No evidence of pulmonary embolism. Normal heart size. Right coronary artery calcifications. No pericardial effusion. Aortic  atherosclerosis. Mediastinum/Nodes: No enlarged mediastinal, hilar, or axillary lymph nodes. Thyroid gland, trachea, and esophagus demonstrate no significant findings. Lungs/Pleura: Diffuse bilateral bronchial wall thickening. Background of very fine centrilobular pulmonary nodules. Bandlike atelectasis or consolidation of the posterior lingula (series 5, 41). No pleural effusion or pneumothorax. Upper Abdomen: Please see separately reported examination of the abdomen and pelvis. Musculoskeletal: No chest wall abnormality. No acute osseous findings. Review of the MIP images confirms the above findings. IMPRESSION: 1. Negative examination for pulmonary embolism. 2. Diffuse bilateral bronchial wall thickening, consistent with nonspecific infectious or inflammatory bronchitis. 3. Background of very fine centrilobular pulmonary nodules, most commonly seen in smoking-related respiratory bronchiolitis although generally nonspecific and infectious or inflammatory. 4. Coronary artery disease. Aortic Atherosclerosis (ICD10-I70.0). Electronically Signed   By: ADelanna AhmadiM.D.   On: 05/12/2022 14:34   DG Chest Port 1 View  Result Date: 05/12/2022 CLINICAL DATA:  Shortness of breath for 1 week following chemotherapy EXAM: PORTABLE CHEST 1 VIEW COMPARISON:  Chest radiograph 08/18/2020 FINDINGS: The heart is at the upper limits of normal for size. The mediastinal contours are normal. There is no focal consolidation or pulmonary edema. There is no pleural effusion or pneumothorax There is no acute osseous abnormality. IMPRESSION: No radiographic evidence of acute cardiopulmonary process. Electronically Signed   By: PValetta MoleM.D.   On: 05/12/2022 13:04    ASSESSMENT & PLAN:   Carcinoma of overlapping sites of right breast in female, estrogen receptor positive ([place] [facility] -[facility]; ER;PR positive G-3; LVI + T2N1.  Stage II [OCT 2023; Dr.Byrnett] s/p Lumpectomy; node positive-Oncotype RS- 35-which  translates to approximately 30% risk of recurrence with endocrine therapy alone. Given high risk Oncotype-s/p  adjuvant chemotherapy with Taxotere-Cytoxan every 3 weeks x4 cycles. Consider abema x2 years.  # Currently s/p 4 cycles of chemotherapy-  Patient's course complicated by neutropenic sepsis/pneumonia; followed by DVT-needing admission to hospital. # Patient will need aromatase inhibitor.  However HOLD endocrine therapy- given acute issues.  Await start radiation on 06/13/2022.   # [2nd,FEB 28502]CTA-  Positive examination for pulmonary embolism, with new segmental to subsegmental embolus present in the right lower lobe.; Rounded subpleural ground-glass opacity of the dependent right lower lobe, distal to embolus, most consistent with developing pulmonary infarct. LEFT LE- .acute DVT-on anticoagulation with eliquis on 5 mg BID. But right pleuritic pain is better , not resolved. Recommend tylenol q 8 hours.      # Loose stool- low concerns for c.diff- recommend immodium prn.    # Hx of colon cancer stage III- s/p FOLFOX [2009]-residual neuropathy monitor closely on chemotherapy.   # Genetic testing: brother-& father- colon cancer; other brother- brain tumor; no breast cancers in family. We will make a referral down the line.   # IV Access: PIV today  #  DISPOSITION: # follow up in 2 weeks- MD: no labs- Dr.B    All questions were answered. The patient/family knows to call the clinic with any problems, questions or concerns.       Cammie Sickle, MD 06/08/2022 1:04 PM

## 2022-06-08 NOTE — Assessment & Plan Note (Addendum)
#   Right breast cancer -[facility]; ER;PR positive G-3; LVI + T2N1.  Stage II [OCT 2023; Dr.Byrnett] s/p Lumpectomy; node positive-Oncotype RS- 35-which translates to approximately 30% risk of recurrence with endocrine therapy alone. Given high risk Oncotype-s/p  adjuvant chemotherapy with Taxotere-Cytoxan every 3 weeks x4 cycles. Consider abema x2 years.  # Currently s/p 4 cycles of chemotherapy-  Patient's course complicated by neutropenic sepsis/pneumonia; followed by DVT-needing admission to hospital. # Patient will need aromatase inhibitor.  However HOLD endocrine therapy- given acute issues.  Await start radiation on 06/13/2022.   # [2nd,FEB 6659] CTA-  Positive examination for pulmonary embolism, with new segmental to subsegmental embolus present in the right lower lobe.; Rounded subpleural ground-glass opacity of the dependent right lower lobe, distal to embolus, most consistent with developing pulmonary infarct. LEFT LE- .acute DVT-on anticoagulation with eliquis on 5 mg BID. But right pleuritic pain is better , not resolved. Recommend tylenol q 8 hours.      # Loose stool- low concerns for c.diff- recommend immodium prn.    # Hx of colon cancer stage III- s/p FOLFOX [2009]-residual neuropathy monitor closely on chemotherapy.   # Genetic testing: brother-& father- colon cancer; other brother- brain tumor; no breast cancers in family. We will make a referral down the line.   # IV Access: PIV today  # DISPOSITION: # follow up in 2 weeks- MD: no labs- Dr.B

## 2022-06-08 NOTE — Progress Notes (Signed)
Patient's back pain was gone by the time she left the hospital. This past Saturday pain came back with such intensity, the pain is so sharp and stabbing if she turns over or takes a deep breath. No pain sitting in the chair. Slight edema in bil ankles elevating helps . Taking eliquis for PE and DVT in her leg. Appetite is good. Having 4-6 soft mushy stools per day.

## 2022-06-09 ENCOUNTER — Telehealth: Payer: Self-pay

## 2022-06-09 ENCOUNTER — Ambulatory Visit: Payer: Medicare Other

## 2022-06-09 ENCOUNTER — Ambulatory Visit
Admission: RE | Admit: 2022-06-09 | Discharge: 2022-06-09 | Disposition: A | Discharge status: Home / Self Care | Payer: Medicare Other | Source: Ambulatory Visit | Attending: Radiation Oncology | Admitting: Radiation Oncology

## 2022-06-09 DIAGNOSIS — Z510 Encounter for antineoplastic radiation therapy: Secondary | ICD-10-CM | POA: Diagnosis not present

## 2022-06-09 DIAGNOSIS — Z170 Estrogen receptor positive status [ER+]: Secondary | ICD-10-CM | POA: Diagnosis not present

## 2022-06-09 DIAGNOSIS — C50811 Malignant neoplasm of overlapping sites of right female breast: Secondary | ICD-10-CM | POA: Diagnosis not present

## 2022-06-09 NOTE — Progress Notes (Signed)
   06/09/2022  Patient ID: Sabrina Wilson, female   DOB: 1940/08/08, 82 y.o.   MRN: [id number]  Contacted patient to let her know medication assistance application for Eliquis is being sent out this week and provide more information on documentation that will be needed for that.  Explained again that 3% income expenditure must be met for approval, and that she can request a print out from her pharmacy to evaluate for this qualification.  She will look over application to determine if she would like to pursue (states current copay is affordable for now and anticipated duration of therapy), and will reach out to me if further assistance is needed.  Darlina Guys, PharmD, DPLA

## 2022-06-10 ENCOUNTER — Ambulatory Visit: Payer: Medicare Other

## 2022-06-10 ENCOUNTER — Inpatient Hospital Stay: Payer: Medicare Other | Admitting: Internal Medicine

## 2022-06-13 ENCOUNTER — Ambulatory Visit: Payer: Medicare Other

## 2022-06-13 ENCOUNTER — Other Ambulatory Visit: Payer: Self-pay

## 2022-06-13 ENCOUNTER — Ambulatory Visit
Admission: RE | Admit: 2022-06-13 | Discharge: 2022-06-13 | Disposition: A | Discharge status: Home / Self Care | Payer: Medicare Other | Source: Ambulatory Visit | Attending: Radiation Oncology | Admitting: Radiation Oncology

## 2022-06-13 DIAGNOSIS — C50811 Malignant neoplasm of overlapping sites of right female breast: Secondary | ICD-10-CM | POA: Diagnosis not present

## 2022-06-13 DIAGNOSIS — Z510 Encounter for antineoplastic radiation therapy: Secondary | ICD-10-CM | POA: Diagnosis not present

## 2022-06-13 DIAGNOSIS — Z170 Estrogen receptor positive status [ER+]: Secondary | ICD-10-CM | POA: Diagnosis not present

## 2022-06-13 LAB — RAD ONC ARIA SESSION SUMMARY
Course Elapsed Days: 0
Plan Fractions Treated to Date: 1
Plan Prescribed Dose Per Fraction: 1.8 Gy
Plan Total Fractions Prescribed: 28
Plan Total Prescribed Dose: 50.4 Gy
Reference Point Dosage Given to Date: 1.8 Gy
Reference Point Session Dosage Given: 1.8 Gy
Session Number: 1

## 2022-06-14 ENCOUNTER — Other Ambulatory Visit: Payer: Self-pay

## 2022-06-14 ENCOUNTER — Other Ambulatory Visit (HOSPITAL_COMMUNITY): Payer: Self-pay

## 2022-06-14 ENCOUNTER — Ambulatory Visit: Payer: Medicare Other

## 2022-06-14 ENCOUNTER — Ambulatory Visit
Admission: RE | Admit: 2022-06-14 | Discharge: 2022-06-14 | Disposition: A | Discharge status: Home / Self Care | Payer: Medicare Other | Source: Ambulatory Visit | Attending: Radiation Oncology | Admitting: Radiation Oncology

## 2022-06-14 ENCOUNTER — Telehealth: Payer: Self-pay

## 2022-06-14 DIAGNOSIS — Z170 Estrogen receptor positive status [ER+]: Secondary | ICD-10-CM | POA: Diagnosis not present

## 2022-06-14 DIAGNOSIS — C50811 Malignant neoplasm of overlapping sites of right female breast: Secondary | ICD-10-CM | POA: Diagnosis not present

## 2022-06-14 DIAGNOSIS — Z510 Encounter for antineoplastic radiation therapy: Secondary | ICD-10-CM | POA: Diagnosis not present

## 2022-06-14 LAB — RAD ONC ARIA SESSION SUMMARY
Course Elapsed Days: 1
Plan Fractions Treated to Date: 2
Plan Prescribed Dose Per Fraction: 1.8 Gy
Plan Total Fractions Prescribed: 28
Plan Total Prescribed Dose: 50.4 Gy
Reference Point Dosage Given to Date: 3.6 Gy
Reference Point Session Dosage Given: 1.8 Gy
Session Number: 2

## 2022-06-14 NOTE — Telephone Encounter (Signed)
Mailed [employer] PAP application to patients home for Eliquis assistance

## 2022-06-15 ENCOUNTER — Other Ambulatory Visit: Payer: Self-pay

## 2022-06-15 ENCOUNTER — Ambulatory Visit: Payer: Medicare Other | Admitting: Family Medicine

## 2022-06-15 ENCOUNTER — Ambulatory Visit: Payer: Medicare Other

## 2022-06-15 ENCOUNTER — Ambulatory Visit
Admission: RE | Admit: 2022-06-15 | Discharge: 2022-06-15 | Disposition: A | Discharge status: Home / Self Care | Payer: Medicare Other | Source: Ambulatory Visit | Attending: Radiation Oncology | Admitting: Radiation Oncology

## 2022-06-15 DIAGNOSIS — Z170 Estrogen receptor positive status [ER+]: Secondary | ICD-10-CM | POA: Diagnosis not present

## 2022-06-15 DIAGNOSIS — Z510 Encounter for antineoplastic radiation therapy: Secondary | ICD-10-CM | POA: Diagnosis not present

## 2022-06-15 DIAGNOSIS — C50811 Malignant neoplasm of overlapping sites of right female breast: Secondary | ICD-10-CM | POA: Diagnosis not present

## 2022-06-15 LAB — RAD ONC ARIA SESSION SUMMARY
Course Elapsed Days: 2
Plan Fractions Treated to Date: 3
Plan Prescribed Dose Per Fraction: 1.8 Gy
Plan Total Fractions Prescribed: 28
Plan Total Prescribed Dose: 50.4 Gy
Reference Point Dosage Given to Date: 5.4 Gy
Reference Point Session Dosage Given: 1.8 Gy
Session Number: 3

## 2022-06-16 ENCOUNTER — Inpatient Hospital Stay: Payer: Medicare Other

## 2022-06-16 ENCOUNTER — Ambulatory Visit: Payer: Medicare Other

## 2022-06-16 ENCOUNTER — Ambulatory Visit
Admission: RE | Admit: 2022-06-16 | Discharge: 2022-06-16 | Disposition: A | Discharge status: Home / Self Care | Payer: Medicare Other | Source: Ambulatory Visit | Attending: Radiation Oncology | Admitting: Radiation Oncology

## 2022-06-16 ENCOUNTER — Other Ambulatory Visit: Payer: Self-pay

## 2022-06-16 DIAGNOSIS — Z510 Encounter for antineoplastic radiation therapy: Secondary | ICD-10-CM | POA: Diagnosis not present

## 2022-06-16 DIAGNOSIS — Z86711 Personal history of pulmonary embolism: Secondary | ICD-10-CM | POA: Diagnosis not present

## 2022-06-16 DIAGNOSIS — Z170 Estrogen receptor positive status [ER+]: Secondary | ICD-10-CM

## 2022-06-16 DIAGNOSIS — M549 Dorsalgia, unspecified: Secondary | ICD-10-CM | POA: Diagnosis not present

## 2022-06-16 DIAGNOSIS — C50811 Malignant neoplasm of overlapping sites of right female breast: Secondary | ICD-10-CM | POA: Diagnosis not present

## 2022-06-16 DIAGNOSIS — Z800 Family history of malignant neoplasm of digestive organs: Secondary | ICD-10-CM | POA: Diagnosis not present

## 2022-06-16 DIAGNOSIS — Z86718 Personal history of other venous thrombosis and embolism: Secondary | ICD-10-CM | POA: Diagnosis not present

## 2022-06-16 DIAGNOSIS — Z9221 Personal history of antineoplastic chemotherapy: Secondary | ICD-10-CM | POA: Diagnosis not present

## 2022-06-16 DIAGNOSIS — Z85038 Personal history of other malignant neoplasm of large intestine: Secondary | ICD-10-CM | POA: Diagnosis not present

## 2022-06-16 LAB — RAD ONC ARIA SESSION SUMMARY
Course Elapsed Days: 3
Plan Fractions Treated to Date: 4
Plan Prescribed Dose Per Fraction: 1.8 Gy
Plan Total Fractions Prescribed: 28
Plan Total Prescribed Dose: 50.4 Gy
Reference Point Dosage Given to Date: 7.2 Gy
Reference Point Session Dosage Given: 1.8 Gy
Session Number: 4

## 2022-06-16 LAB — CBC
HCT: 32.5 % — ABNORMAL LOW (ref 36.0–46.0)
Hemoglobin: 10.2 g/dL — ABNORMAL LOW (ref 12.0–15.0)
MCH: 33.1 pg (ref 26.0–34.0)
MCHC: 31.4 g/dL (ref 30.0–36.0)
MCV: 105.5 fL — ABNORMAL HIGH (ref 80.0–100.0)
Platelets: 227 10*3/uL (ref 150–400)
RBC: 3.08 MIL/uL — ABNORMAL LOW (ref 3.87–5.11)
RDW: 15.7 % — ABNORMAL HIGH (ref 11.5–15.5)
WBC: 5.9 10*3/uL (ref 4.0–10.5)
nRBC: 0 % (ref 0.0–0.2)

## 2022-06-17 ENCOUNTER — Ambulatory Visit
Admission: RE | Admit: 2022-06-17 | Discharge: 2022-06-17 | Disposition: A | Discharge status: Home / Self Care | Payer: Medicare Other | Source: Ambulatory Visit | Attending: Radiation Oncology | Admitting: Radiation Oncology

## 2022-06-17 ENCOUNTER — Ambulatory Visit: Payer: Medicare Other

## 2022-06-17 ENCOUNTER — Other Ambulatory Visit: Payer: Self-pay

## 2022-06-17 DIAGNOSIS — Z170 Estrogen receptor positive status [ER+]: Secondary | ICD-10-CM | POA: Diagnosis not present

## 2022-06-17 DIAGNOSIS — C50811 Malignant neoplasm of overlapping sites of right female breast: Secondary | ICD-10-CM | POA: Diagnosis not present

## 2022-06-17 DIAGNOSIS — Z510 Encounter for antineoplastic radiation therapy: Secondary | ICD-10-CM | POA: Diagnosis not present

## 2022-06-17 LAB — RAD ONC ARIA SESSION SUMMARY
Course Elapsed Days: 4
Plan Fractions Treated to Date: 5
Plan Prescribed Dose Per Fraction: 1.8 Gy
Plan Total Fractions Prescribed: 28
Plan Total Prescribed Dose: 50.4 Gy
Reference Point Dosage Given to Date: 9 Gy
Reference Point Session Dosage Given: 1.8 Gy
Session Number: 5

## 2022-06-20 ENCOUNTER — Ambulatory Visit: Payer: Medicare Other

## 2022-06-20 ENCOUNTER — Ambulatory Visit
Admission: RE | Admit: 2022-06-20 | Discharge: 2022-06-20 | Disposition: A | Discharge status: Home / Self Care | Payer: Medicare Other | Source: Ambulatory Visit | Attending: Radiation Oncology | Admitting: Radiation Oncology

## 2022-06-20 ENCOUNTER — Other Ambulatory Visit: Payer: Self-pay

## 2022-06-20 DIAGNOSIS — Z170 Estrogen receptor positive status [ER+]: Secondary | ICD-10-CM | POA: Diagnosis not present

## 2022-06-20 DIAGNOSIS — Z510 Encounter for antineoplastic radiation therapy: Secondary | ICD-10-CM | POA: Diagnosis not present

## 2022-06-20 DIAGNOSIS — C50811 Malignant neoplasm of overlapping sites of right female breast: Secondary | ICD-10-CM | POA: Diagnosis not present

## 2022-06-20 LAB — RAD ONC ARIA SESSION SUMMARY
Course Elapsed Days: 7
Plan Fractions Treated to Date: 6
Plan Prescribed Dose Per Fraction: 1.8 Gy
Plan Total Fractions Prescribed: 28
Plan Total Prescribed Dose: 50.4 Gy
Reference Point Dosage Given to Date: 10.8 Gy
Reference Point Session Dosage Given: 1.8 Gy
Session Number: 6

## 2022-06-21 ENCOUNTER — Ambulatory Visit: Payer: Medicare Other

## 2022-06-21 ENCOUNTER — Other Ambulatory Visit: Payer: Self-pay

## 2022-06-21 ENCOUNTER — Ambulatory Visit
Admission: RE | Admit: 2022-06-21 | Discharge: 2022-06-21 | Disposition: A | Discharge status: Home / Self Care | Payer: Medicare Other | Source: Ambulatory Visit | Attending: Radiation Oncology | Admitting: Radiation Oncology

## 2022-06-21 DIAGNOSIS — C50811 Malignant neoplasm of overlapping sites of right female breast: Secondary | ICD-10-CM | POA: Diagnosis not present

## 2022-06-21 DIAGNOSIS — Z510 Encounter for antineoplastic radiation therapy: Secondary | ICD-10-CM | POA: Diagnosis not present

## 2022-06-21 DIAGNOSIS — Z170 Estrogen receptor positive status [ER+]: Secondary | ICD-10-CM | POA: Diagnosis not present

## 2022-06-21 LAB — RAD ONC ARIA SESSION SUMMARY
Course Elapsed Days: 8
Plan Fractions Treated to Date: 7
Plan Prescribed Dose Per Fraction: 1.8 Gy
Plan Total Fractions Prescribed: 28
Plan Total Prescribed Dose: 50.4 Gy
Reference Point Dosage Given to Date: 12.6 Gy
Reference Point Session Dosage Given: 1.8 Gy
Session Number: 7

## 2022-06-22 ENCOUNTER — Ambulatory Visit: Payer: Medicare Other

## 2022-06-22 ENCOUNTER — Ambulatory Visit
Admission: RE | Admit: 2022-06-22 | Discharge: 2022-06-22 | Disposition: A | Discharge status: Home / Self Care | Payer: Medicare Other | Source: Ambulatory Visit | Attending: Radiation Oncology | Admitting: Radiation Oncology

## 2022-06-22 ENCOUNTER — Other Ambulatory Visit: Payer: Self-pay

## 2022-06-22 DIAGNOSIS — C50811 Malignant neoplasm of overlapping sites of right female breast: Secondary | ICD-10-CM | POA: Diagnosis not present

## 2022-06-22 DIAGNOSIS — Z170 Estrogen receptor positive status [ER+]: Secondary | ICD-10-CM | POA: Diagnosis not present

## 2022-06-22 DIAGNOSIS — Z510 Encounter for antineoplastic radiation therapy: Secondary | ICD-10-CM | POA: Diagnosis not present

## 2022-06-22 LAB — RAD ONC ARIA SESSION SUMMARY
Course Elapsed Days: 9
Plan Fractions Treated to Date: 8
Plan Prescribed Dose Per Fraction: 1.8 Gy
Plan Total Fractions Prescribed: 28
Plan Total Prescribed Dose: 50.4 Gy
Reference Point Dosage Given to Date: 14.4 Gy
Reference Point Session Dosage Given: 1.8 Gy
Session Number: 8

## 2022-06-23 ENCOUNTER — Ambulatory Visit: Payer: Medicare Other

## 2022-06-23 ENCOUNTER — Ambulatory Visit
Admission: RE | Admit: 2022-06-23 | Discharge: 2022-06-23 | Disposition: A | Discharge status: Home / Self Care | Payer: Medicare Other | Source: Ambulatory Visit | Attending: Radiation Oncology | Admitting: Radiation Oncology

## 2022-06-23 ENCOUNTER — Other Ambulatory Visit: Payer: Self-pay

## 2022-06-23 DIAGNOSIS — C50811 Malignant neoplasm of overlapping sites of right female breast: Secondary | ICD-10-CM | POA: Diagnosis not present

## 2022-06-23 DIAGNOSIS — Z170 Estrogen receptor positive status [ER+]: Secondary | ICD-10-CM | POA: Diagnosis not present

## 2022-06-23 DIAGNOSIS — Z510 Encounter for antineoplastic radiation therapy: Secondary | ICD-10-CM | POA: Diagnosis not present

## 2022-06-23 LAB — RAD ONC ARIA SESSION SUMMARY
Course Elapsed Days: 10
Plan Fractions Treated to Date: 9
Plan Prescribed Dose Per Fraction: 1.8 Gy
Plan Total Fractions Prescribed: 28
Plan Total Prescribed Dose: 50.4 Gy
Reference Point Dosage Given to Date: 16.2 Gy
Reference Point Session Dosage Given: 1.8 Gy
Session Number: 9

## 2022-06-24 ENCOUNTER — Ambulatory Visit: Payer: Medicare Other

## 2022-06-24 ENCOUNTER — Other Ambulatory Visit: Payer: Self-pay

## 2022-06-24 ENCOUNTER — Ambulatory Visit
Admission: RE | Admit: 2022-06-24 | Discharge: 2022-06-24 | Disposition: A | Discharge status: Home / Self Care | Payer: Medicare Other | Source: Ambulatory Visit | Attending: Radiation Oncology | Admitting: Radiation Oncology

## 2022-06-24 DIAGNOSIS — Z170 Estrogen receptor positive status [ER+]: Secondary | ICD-10-CM | POA: Insufficient documentation

## 2022-06-24 DIAGNOSIS — C50811 Malignant neoplasm of overlapping sites of right female breast: Secondary | ICD-10-CM | POA: Diagnosis not present

## 2022-06-24 DIAGNOSIS — Z510 Encounter for antineoplastic radiation therapy: Secondary | ICD-10-CM | POA: Diagnosis not present

## 2022-06-24 LAB — RAD ONC ARIA SESSION SUMMARY
Course Elapsed Days: 11
Plan Fractions Treated to Date: 10
Plan Prescribed Dose Per Fraction: 1.8 Gy
Plan Total Fractions Prescribed: 28
Plan Total Prescribed Dose: 50.4 Gy
Reference Point Dosage Given to Date: 18 Gy
Reference Point Session Dosage Given: 1.8 Gy
Session Number: 10

## 2022-06-27 ENCOUNTER — Ambulatory Visit
Admission: RE | Admit: 2022-06-27 | Discharge: 2022-06-27 | Disposition: A | Discharge status: Home / Self Care | Payer: Medicare Other | Source: Ambulatory Visit | Attending: Radiation Oncology | Admitting: Radiation Oncology

## 2022-06-27 ENCOUNTER — Ambulatory Visit: Payer: Medicare Other

## 2022-06-27 ENCOUNTER — Other Ambulatory Visit: Payer: Self-pay

## 2022-06-27 DIAGNOSIS — Z170 Estrogen receptor positive status [ER+]: Secondary | ICD-10-CM | POA: Diagnosis not present

## 2022-06-27 DIAGNOSIS — Z510 Encounter for antineoplastic radiation therapy: Secondary | ICD-10-CM | POA: Diagnosis not present

## 2022-06-27 DIAGNOSIS — C50811 Malignant neoplasm of overlapping sites of right female breast: Secondary | ICD-10-CM | POA: Diagnosis not present

## 2022-06-27 LAB — RAD ONC ARIA SESSION SUMMARY
Course Elapsed Days: 14
Plan Fractions Treated to Date: 11
Plan Prescribed Dose Per Fraction: 1.8 Gy
Plan Total Fractions Prescribed: 28
Plan Total Prescribed Dose: 50.4 Gy
Reference Point Dosage Given to Date: 19.8 Gy
Reference Point Session Dosage Given: 1.8 Gy
Session Number: 11

## 2022-06-28 ENCOUNTER — Ambulatory Visit: Payer: Medicare Other

## 2022-06-28 ENCOUNTER — Other Ambulatory Visit: Payer: Self-pay

## 2022-06-28 ENCOUNTER — Ambulatory Visit
Admission: RE | Admit: 2022-06-28 | Discharge: 2022-06-28 | Disposition: A | Discharge status: Home / Self Care | Payer: Medicare Other | Source: Ambulatory Visit | Attending: Radiation Oncology | Admitting: Radiation Oncology

## 2022-06-28 DIAGNOSIS — Z510 Encounter for antineoplastic radiation therapy: Secondary | ICD-10-CM | POA: Diagnosis not present

## 2022-06-28 DIAGNOSIS — Z170 Estrogen receptor positive status [ER+]: Secondary | ICD-10-CM | POA: Diagnosis not present

## 2022-06-28 DIAGNOSIS — C50811 Malignant neoplasm of overlapping sites of right female breast: Secondary | ICD-10-CM | POA: Diagnosis not present

## 2022-06-28 LAB — RAD ONC ARIA SESSION SUMMARY
Course Elapsed Days: 15
Plan Fractions Treated to Date: 12
Plan Prescribed Dose Per Fraction: 1.8 Gy
Plan Total Fractions Prescribed: 28
Plan Total Prescribed Dose: 50.4 Gy
Reference Point Dosage Given to Date: 21.6 Gy
Reference Point Session Dosage Given: 1.8 Gy
Session Number: 12

## 2022-06-29 ENCOUNTER — Other Ambulatory Visit: Payer: Self-pay

## 2022-06-29 ENCOUNTER — Ambulatory Visit
Admission: RE | Admit: 2022-06-29 | Discharge: 2022-06-29 | Disposition: A | Discharge status: Home / Self Care | Payer: Medicare Other | Source: Ambulatory Visit | Attending: Radiation Oncology | Admitting: Radiation Oncology

## 2022-06-29 ENCOUNTER — Ambulatory Visit: Payer: Medicare Other

## 2022-06-29 ENCOUNTER — Encounter: Payer: Self-pay | Admitting: Internal Medicine

## 2022-06-29 ENCOUNTER — Inpatient Hospital Stay: Payer: Medicare Other | Attending: Internal Medicine | Admitting: Internal Medicine

## 2022-06-29 VITALS — BP 154/73 | HR 74 | Temp 96.9°F | Resp 15 | Wt 181.0 lb

## 2022-06-29 DIAGNOSIS — Z87891 Personal history of nicotine dependence: Secondary | ICD-10-CM | POA: Insufficient documentation

## 2022-06-29 DIAGNOSIS — M8580 Other specified disorders of bone density and structure, unspecified site: Secondary | ICD-10-CM | POA: Insufficient documentation

## 2022-06-29 DIAGNOSIS — Z7901 Long term (current) use of anticoagulants: Secondary | ICD-10-CM | POA: Diagnosis not present

## 2022-06-29 DIAGNOSIS — Z510 Encounter for antineoplastic radiation therapy: Secondary | ICD-10-CM | POA: Diagnosis not present

## 2022-06-29 DIAGNOSIS — Z86711 Personal history of pulmonary embolism: Secondary | ICD-10-CM | POA: Diagnosis not present

## 2022-06-29 DIAGNOSIS — Z170 Estrogen receptor positive status [ER+]: Secondary | ICD-10-CM | POA: Insufficient documentation

## 2022-06-29 DIAGNOSIS — I10 Essential (primary) hypertension: Secondary | ICD-10-CM | POA: Insufficient documentation

## 2022-06-29 DIAGNOSIS — Z79899 Other long term (current) drug therapy: Secondary | ICD-10-CM | POA: Insufficient documentation

## 2022-06-29 DIAGNOSIS — G629 Polyneuropathy, unspecified: Secondary | ICD-10-CM | POA: Diagnosis not present

## 2022-06-29 DIAGNOSIS — Z800 Family history of malignant neoplasm of digestive organs: Secondary | ICD-10-CM | POA: Diagnosis not present

## 2022-06-29 DIAGNOSIS — C50811 Malignant neoplasm of overlapping sites of right female breast: Secondary | ICD-10-CM

## 2022-06-29 LAB — RAD ONC ARIA SESSION SUMMARY
Course Elapsed Days: 16
Plan Fractions Treated to Date: 13
Plan Prescribed Dose Per Fraction: 1.8 Gy
Plan Total Fractions Prescribed: 28
Plan Total Prescribed Dose: 50.4 Gy
Reference Point Dosage Given to Date: 23.4 Gy
Reference Point Session Dosage Given: 1.8 Gy
Session Number: 13

## 2022-06-29 MED ORDER — APIXABAN 5 MG PO TABS: ORAL_TABLET | ORAL | 4 refills | Status: DC

## 2022-06-29 NOTE — Progress Notes (Signed)
Would like to get a refill for Eliquis '5mg'$ . Doesn't see a difference in using Incruse, she is out of refills.    She does have bilateral swelling in both ankles.

## 2022-06-29 NOTE — Progress Notes (Signed)
[facility] [facility] NOTE  Patient Care Team: Leone Haven, MD as PCP - General (Family Medicine) Kate Sable, MD as PCP - Cardiology (Cardiology) Bary Castilla Forest Gleason, MD as Consulting Physician (General Surgery) Dallas Schimke, MD (Internal Medicine) Daiva Huge, RN as Oncology Nurse Navigator  CHIEF COMPLAINTS/PURPOSE OF CONSULTATION: Breast cancer  #  Oncology History Overview Note  # 2009- Sigmoid colon cancer stage II (T3 N0 4 lymph nodes sampled M0 stage II high risk there was obstruction and perforation abscess; Dr.Hern), workup included a low CEA preoperatively and a chest x-ray and CT scan abdomen pelvis negative for metastatic disease, S/P colostomy; s/p FOLFOX x 6 months. [Dr.Gittin ]  # LATER REVERSED , K RAS  wild type B RAF not evaluated   Also initial iron deficiency with anemia thrombocytosis, high platelets considered reactive, workup includes a normal PCR for CML and a negative [id number] mutation  DIAGNOSIS:  A. BREAST, RIGHT; LUMPECTOMY:  - INVASIVE MAMMARY CARCINOMA.  - DUCTAL CARCINOMA IN SITU (DCIS).  - SEE CANCER SUMMARY BELOW.  - BIOPSY SITE CHANGE WITH CLIP (2).  - FIBROUS TISSUE WITH CYST FORMATION AND FLORID DUCTAL HYPERPLASIA.  - SKIN AND NIPPLE NEGATIVE FOR MALIGNANCY.  - VASCULAR CALCIFICATION.   B. SENTINEL LYMPH NODES 1 AND 2, RIGHT AXILLA; EXCISION:  - METASTATIC CARCINOMA INVOLVES ONE OF TWO LYMPH NODES (1/2).   C. NON-SENTINEL LYMPH NODES, RIGHT AXILLA; EXCISION:  - ONE LYMPH NODE NEGATIVE FOR MALIGNANCY (0/1).   CANCER CASE SUMMARY: INVASIVE CARCINOMA OF THE BREAST  Standard(s): AJCC-UICC 8   SPECIMEN  Procedure: Lumpectomy  Specimen Laterality: Right   TUMOR  Histologic Type: Invasive carcinoma of no special type (ductal)  Histologic Grade (Nottingham Histologic Score)       Glandular (Acinar)/Tubular Differentiation: 3       Nuclear Pleomorphism: 3       Mitotic Rate: 3       Overall Grade: 3  Tumor Size:  24 mm  Tumor Focality: Single focus of invasive carcinoma  Ductal Carcinoma In Situ (DCIS): Present, nuclear grade 2-3  Tumor Extent: Not applicable  Lymphatic and/or Vascular Invasion: Present  Treatment Effect in the Breast: No known presurgical therapy   MARGINS  Margin Status for Invasive Carcinoma: All margins negative for invasive  carcinoma       Distance from closest margin: 10 mm       Specify closest margin: Superior   Margin Status for DCIS: All margins negative for DCIS       Distance from DCIS to closest margin: 10 mm       Specify closest margin: Superior   REGIONAL LYMPH NODES  Regional Lymph Node Status: Tumor present in regional lymph node(s)       Number of Lymph Nodes with Macrometastases (greater than 2 mm): 1       Number of Lymph Nodes with Micrometastases (greater than 0.2 mm to  2 mm and/or greater than 200 cells): 0       Number of Lymph Nodes with Isolated Tumor Cells (0.2 mm or less OR  200 cells or less): 0       Size of Largest Metastatic Deposit: 3 mm      Extranodal Extension: Not identified       Total Number of Lymph Nodes Examined (sentinel and non-sentinel): 3       Number of Sentinel Nodes Examined: 2   DISTANT METASTASIS  Distant Site(s) Involved, if applicable: Not applicable  PATHOLOGIC STAGE CLASSIFICATION (pTNM, AJCC 8th Edition):  Modified Classification: Not applicable  pT Category: pT2  T Suffix: Not applicable  pN Category: pN1a  N Suffix: (sn)  pM Category: Not applicable   SPECIAL STUDIES  Breast Biomarker Testing Performed on Previous Outside Biopsy:  23-250-T11  Per outside report:  Estrogen Receptor (ER) Status: POSITIVE          Percentage of cells with nuclear positivity: Greater than 90%          Average intensity of staining: Strong   Progesterone Receptor (PgR) Status: POSITIVE          Percentage of cells with nuclear positivity: 60%          Average intensity of staining: Strong   HER2 (by  immunohistochemistry): EQUIVOCAL (Score 2+)  HER2 FISH: NEGATIVE   Ki-67: Not performed   # Cytoxan Taxotere x 4 cycles; finished JAN, 2024. [Neutropenic fevers s/p cycle #4;  -FEB 2nd, 2024-right lung PE; left lower LE  DVT- on eliquis.   # FEB 19th-start RT  IMPRESSION: 1. Complex cystic and solid-appearing mass within the subareolar RIGHT breast, overall measuring 3.1 cm greatest dimension, the solid enhancing component at the anterior-lateral aspect of the mass measuring 2 x 1 cm, abutting the overlying skin but without evidence of involvement/enhancement of the skin. Susceptibility artifact within the mass may represent a biopsy clip (if biopsy has been performed at an outside site since the diagnostic mammogram/ultrasound of 12/28/2021) or this artifact could represent calcifications within the mass. This mass corresponds to the recent ultrasound finding of a complex irregular mass at the 11 o'clock axis of the retroareolar RIGHT breast. 2. No evidence of multifocal or multicentric disease within the RIGHT breast. 3. No evidence of contralateral disease in the LEFT breast.   RECOMMENDATION: If has not already been performed, recommend ultrasound-guided biopsy of the suspicious mass in the subareolar RIGHT breast, with preferential targeting of the anterior-lateral component of the mass which is shown to be the enhancing component on today's MRI.   BI-RADS CATEGORY  4: Suspicious.     Cancer of sigmoid ([place]) (Resolved)  Carcinoma of overlapping sites of right breast in female, estrogen receptor positive ([place])  02/14/2022 Initial Diagnosis   Carcinoma of overlapping sites of right breast in female, estrogen receptor positive ([place])   02/14/2022 Cancer Staging   Staging form: Breast, AJCC 8th Edition - Pathologic: Stage IIA (pT2, pN1, cM0, G3, ER+, PR+, HER2-) - Signed by Cammie Sickle, MD on 02/14/2022 Histologic grading system: 3 grade system   02/23/2022 -   Chemotherapy   Patient is on Treatment Plan : BREAST TC q21d        HISTORY OF PRESENTING ILLNESS:   Alone.  Ambulating independently.   Sabrina Wilson 82 y.o.  female patient with ER/PR positive Her 2 Negative  breast cancer stage II -currently s/p adjuvant chemotherapy; currently on adjuvant radiation; DVT PE on Eliquis is here for follow-up.    She does have bilateral swelling in both ankles.  Mostly during end of the day.  Improves in the morning.  Denies any worsening shortness of breath or cough.  Back pain improved on Tylenol.  Denies any blood in the black or stools.  Review of Systems  Constitutional:  Negative for chills, diaphoresis, fever, malaise/fatigue and weight loss.  HENT:  Negative for nosebleeds and sore throat.   Eyes:  Negative for double vision.  Respiratory:  Negative for cough, hemoptysis, sputum production, shortness of  breath and wheezing.   Cardiovascular:  Negative for chest pain, palpitations, orthopnea and leg swelling.  Gastrointestinal:  Negative for abdominal pain, blood in stool, constipation, diarrhea, heartburn, melena, nausea and vomiting.  Genitourinary:  Negative for dysuria, frequency and urgency.  Musculoskeletal:  Negative for back pain and joint pain.  Skin: Negative.  Negative for itching and rash.  Neurological:  Negative for dizziness, tingling, focal weakness, weakness and headaches.  Endo/Heme/Allergies:  Does not bruise/bleed easily.  Psychiatric/Behavioral:  Negative for depression. The patient is not nervous/anxious and does not have insomnia.      MEDICAL HISTORY:  Past Medical History:  Diagnosis Date   Actinic keratosis 02/12/2020   R lat calf (hypertrophic)   Arthritis    Cancer of sigmoid ([place]) 06/17/2016   Colon cancer ([place]) 2009   T3,N0; s/p resection  Dr. Hulda Humphrey and chemotherapy by Dr. Cynda Acres   Diastolic dysfunction    a. 08/2020 Echo: EF 60-65%, no rwma, Gr1 DD, nl RV size/fxn. Mild MR.   Hernia of flank    History  of actinic keratoses 08/11/2020   Bx proven at left lateral ankle proximal, LN2 10/08/20   History of stress test    a. 09/2020 MV: EF >65%. No ischemia/infarct. Mild Ao Ca2+ and minimal Cor Ca2+.   Hypertension    Neuropathy    Polyp at cervical os    s/p resection Dr. Sabra Heck   Skin cancer 01/22/2020   surface of an atypical verrucous squamous proliferation    Squamous cell carcinoma of skin 07/16/2020   Left lateral ankle anterior, treated with [facility] 08-11-2020    SURGICAL HISTORY: Past Surgical History:  Procedure Laterality Date   BREAST BIOPSY Right 05/2014   Dr. Dwyane Luo office-benign   BREAST LUMPECTOMY WITH SENTINEL LYMPH NODE BIOPSY Right 01/21/2022   Procedure: BREAST LUMPECTOMY WITH SENTINEL LYMPH NODE BX;  Surgeon: Robert Bellow, MD;  Location: [facility] ORS;  Service: General;  Laterality: Right;   BREAST SURGERY Right February 2016   Vacuum assisted biopsy for recurrent cyst, fibrocystic changes without evidence of malignancy.   CATARACT EXTRACTION W/PHACO Left 01/13/2015   Procedure: CATARACT EXTRACTION PHACO AND INTRAOCULAR LENS PLACEMENT (IOC);  Surgeon: Birder Robson, MD;  Location: [facility] ORS;  Service: Ophthalmology;  Laterality: Left;  [place]  1:02.9 AP   19.2 CDE  12.06 casette lot #  [id number]   CATARACT EXTRACTION W/PHACO Right 02/03/2015   Procedure: CATARACT EXTRACTION PHACO AND INTRAOCULAR LENS PLACEMENT (IOC);  Surgeon: Birder Robson, MD;  Location: [facility] ORS;  Service: Ophthalmology;  Laterality: Right;  us00:53 ap46.5 [id number]   COLECTOMY     COLONOSCOPY  2015   Dr Candace Cruise   COLONOSCOPY WITH PROPOFOL N/A 09/13/2017   Procedure: COLONOSCOPY WITH PROPOFOL;  Surgeon: Robert Bellow, MD;  Location[facility] ENDOSCOPY;  Service: Endoscopy;  Laterality: N/A;   COLONOSCOPY WITH PROPOFOL N/A 11/08/2017   Procedure: COLONOSCOPY WITH PROPOFOL;  Surgeon: Robert Bellow, MD;  Location: [facility] ENDOSCOPY;  Service: Endoscopy;  Laterality: N/A;   colostomy reversal      DILATION AND CURETTAGE OF UTERUS  2014   Dr. Sabra Heck, benign per pt   EYE SURGERY     HERNIA REPAIR  07/13/12   ventral    SKIN CANCER EXCISION     TONSILLECTOMY      SOCIAL HISTORY: Social History   Socioeconomic History   Marital status: Married    Spouse name: Not on file   Number of children: Not on file   Years of  education: Not on file   Highest education level: Not on file  Occupational History   Not on file  Tobacco Use   Smoking status: Former    Types: Cigarettes    Quit date: 10/12/2007    Years since quitting: 14.7   Smokeless tobacco: Never  Vaping Use   Vaping Use: Never used  Substance and Sexual Activity   Alcohol use: Yes    Alcohol/week: 1.0 standard drink of alcohol    Types: 1 Standard drinks or equivalent per week    Comment: with dinner GLASS OF WINE EACH DAY   Drug use: No   Sexual activity: Not on file  Other Topics Concern   Not on file  Social History Narrative   Lives in [place] with husband. 2 children, son lives nearby.Diet - regularExercise - none. 30 mins/in [place]. Quit smoking in 2009. Wine before dinner. Pianist in church; real estate; Adult nurse.    Social Determinants of Health   Financial Resource Strain: Low Risk  (05/19/2022)   Overall Financial Resource Strain (CARDIA)    Difficulty of Paying Living Expenses: Not very hard  Food Insecurity: No Food Insecurity (06/03/2022)   Hunger Vital Sign    Worried About Running Out of Food in the Last Year: Never true    Ran Out of Food in the Last Year: Never true  Transportation Needs: No Transportation Needs (05/28/2022)   PRAPARE - Hydrologist (Medical): No    Lack of Transportation (Non-Medical): No  Physical Activity: Not on file  Stress: No Stress Concern Present (11/16/2020)   [place]    Feeling of Stress : Not at all  Social Connections: Not on file  Intimate Partner Violence:  Not At Risk (05/28/2022)   Humiliation, Afraid, Rape, and Kick questionnaire    Fear of Current or Ex-Partner: No    Emotionally Abused: No    Physically Abused: No    Sexually Abused: No    FAMILY HISTORY: Family History  Problem Relation Age of Onset   Stroke Mother    Diabetes Mother    Cancer Father        colon   Stroke Sister    Cancer Brother        colon   Cancer Brother        brain    ALLERGIES:  is allergic to sulfa antibiotics.  MEDICATIONS:  Current Outpatient Medications  Medication Sig Dispense Refill   atorvastatin (LIPITOR) 10 MG tablet Take 1 tablet (10 mg total) by mouth daily. 90 tablet 1   Calcium Carbonate-Vit D-Min (CALCIUM 1200 PO) Take 1 tablet by mouth daily.     niacinamide 500 MG tablet Take 500 mg by mouth 2 (two) times daily.     Omega-3 Fatty Acids (FISH OIL PO) Take 1 capsule by mouth daily.     umeclidinium bromide (INCRUSE ELLIPTA) 62.5 MCG/ACT AEPB Inhale 1 puff into the lungs daily. 30 each 0   VITAMIN D, CHOLECALCIFEROL, PO Take 2,000 Units by mouth daily.     albuterol (VENTOLIN HFA) 108 (90 Base) MCG/ACT inhaler Inhale 2 puffs into the lungs every 6 (six) hours as needed for wheezing or shortness of breath. (Patient not taking: Reported on 06/29/2022) 8 g 2   apixaban (ELIQUIS) 5 MG TABS tablet Two tabs po twice a day for five days then 1 tab po twice a day afterwards 60 tablet 4   guaiFENesin (MUCINEX)  600 MG 12 hr tablet Take 1 tablet (600 mg total) by mouth 2 (two) times daily as needed for cough or to loosen phlegm. (Patient not taking: Reported on 06/07/2022) 10 tablet 0   HYDROcodone-acetaminophen (NORCO/VICODIN) 5-325 MG tablet Take 1 tablet by mouth every 6 (six) hours as needed for severe pain. (Patient not taking: Reported on 06/29/2022) 10 tablet 0   No current facility-administered medications for this visit.      Marland Kitchen  PHYSICAL EXAMINATION:   Vitals:   06/29/22 1509  BP: (!) 154/73  Pulse: 74  Resp: 15  Temp: (!) 96.9 F  (36.1 C)  SpO2: 100%   Filed Weights   06/29/22 1509  Weight: 181 lb (82.1 kg)    Physical Exam Vitals and nursing note reviewed.  HENT:     Head: Normocephalic and atraumatic.     Mouth/Throat:     Pharynx: Oropharynx is clear.  Eyes:     Extraocular Movements: Extraocular movements intact.     Pupils: Pupils are equal, round, and reactive to light.  Cardiovascular:     Rate and Rhythm: Normal rate and regular rhythm.  Pulmonary:     Comments: Decreased breath sounds bilaterally.  Abdominal:     Palpations: Abdomen is soft.  Musculoskeletal:        General: Normal range of motion.     Cervical back: Normal range of motion.  Skin:    General: Skin is warm.  Neurological:     General: No focal deficit present.     Mental Status: She is alert and oriented to person, place, and time.  Psychiatric:        Behavior: Behavior normal.        Judgment: Judgment normal.      LABORATORY DATA:  I have reviewed the data as listed Lab Results  Component Value Date   WBC 5.9 06/16/2022   HGB 10.2 (L) 06/16/2022   HCT 32.5 (L) 06/16/2022   MCV 105.5 (H) 06/16/2022   PLT 227 06/16/2022   Recent Labs    05/13/22 0420 05/14/22 0409 05/29/22 0929 05/30/22 0606 05/31/22 0410 06/03/22 1116 06/08/22 1031  NA 134*   < > 128* 131* 129* 135 135  K 3.1*   < > 3.8 3.4* 4.0 4.2 4.4  CL 100   < > 95* 96* 96* 98 100  CO2 24   < > '25 27 24 26 27  '$ GLUCOSE 263*   < > 108* 121* 225* 150* 113*  BUN 10   < > '14 11 16 '$ 26* 15  CREATININE 0.90   < > 0.69 0.66 0.75 0.85 0.76  CALCIUM 8.8*   < > 9.0 9.0 9.3 9.9 9.7  GFRNONAA >60   < > >60 >60 >60  --  >60  PROT 5.8*  --  5.7*  --   --   --  6.2*  ALBUMIN 2.9*  --  3.0*  --   --   --  3.3*  AST 42*  --  12*  --   --   --  18  ALT 15  --  14  --   --   --  26  ALKPHOS 78  --  47  --   --   --  57  BILITOT 0.4  --  1.1  --   --   --  0.6   < > = values in this interval not displayed.    RADIOGRAPHIC STUDIES: I have personally  reviewed the radiological images as listed and agreed with the findings in the report. No results found.  ASSESSMENT & PLAN:   Carcinoma of overlapping sites of right breast in female, estrogen receptor positive ([place]) # Right breast cancer -[facility]; ER/PR positive Her 2 Negative ;  G-3; LVI + T2N1.  Stage II [OCT 2023; Dr.Byrnett] s/p Lumpectomy; node positive-Oncotype RS- 35- s/p  adjuvant chemotherapy with Taxotere-Cytoxan every 3 weeks x4 cycles. Consider abema x2 years/zometa.    # Currently undergoing postlumpectomy radiation.  Until April 3rd 2024.  #Discussed the mechanism of action of aromatase inhibitors-with blocking of estrogen to prevent breast cancer.  Also discussed the potential side effects including but not limited to arthralgias hot flashes and increased risk of osteoporosis.  Plan start at next visit.   # [2nd,FEB [id number] CTA-  acute pulmonary embolism, with new segmental to subsegmental embolus present in the right lower lobe/right lower lobe-pulmonary infarct. LEFT LE- Acute DVT-on anticoagulation with eliquis on 5 mg BID X6 months- [provoked sec to chemo].  New prescription sent.  # Bil LE worsened-clinically not suggestive of worsening DVT.  Suspect multifactorial varicosity/? Neuropathy-recommend bilateral compression stockings above the knee.  # Bone health-2021-osteopenic  BMD-T-score -1.1.  Continue calcium plus vitamin D.  Repeat bone density.  # Genetic testing: brother-& father- colon cancer; other brother- brain tumor; no breast cancers in family. We will make a referral today to genetics.   # IV Access: PIV today  # DISPOSITION: # referral to genetics- Hx of breast cancer/colon cancer # follow in 6 weeks- MD; labs- cbc/cmp; Bone density prior- Dr.B  All questions were answered. The patient/family knows to call the clinic with any problems, questions or concerns.    Cammie Sickle, MD 06/29/2022 4:24 PM

## 2022-06-29 NOTE — Assessment & Plan Note (Addendum)
#   Right breast cancer -[facility]; ER/PR positive Her 2 Negative ;  G-3; LVI + T2N1.  Stage II [OCT 2023; Dr.Byrnett] s/p Lumpectomy; node positive-Oncotype RS- 35- s/p  adjuvant chemotherapy with Taxotere-Cytoxan every 3 weeks x4 cycles. Consider abema x2 years/zometa.    # Currently undergoing postlumpectomy radiation.  Until April 3rd 2024.  #Discussed the mechanism of action of aromatase inhibitors-with blocking of estrogen to prevent breast cancer.  Also discussed the potential side effects including but not limited to arthralgias hot flashes and increased risk of osteoporosis.  Plan start at next visit.   # [2nd,FEB [id number] CTA-  acute pulmonary embolism, with new segmental to subsegmental embolus present in the right lower lobe/right lower lobe-pulmonary infarct. LEFT LE- Acute DVT-on anticoagulation with eliquis on 5 mg BID X6 months- [provoked sec to chemo].  New prescription sent.  # Bil LE worsened-clinically not suggestive of worsening DVT.  Suspect multifactorial varicosity/? Neuropathy-recommend bilateral compression stockings above the knee.  # Bone health-2021-osteopenic  BMD-T-score -1.1.  Continue calcium plus vitamin D.  Repeat bone density.  # Genetic testing: brother-& father- colon cancer; other brother- brain tumor; no breast cancers in family. We will make a referral today to genetics.   # IV Access: PIV today  # DISPOSITION: # referral to genetics- Hx of breast cancer/colon cancer # follow in 6 weeks- MD; labs- cbc/cmp; Bone density prior- Dr.B

## 2022-06-30 ENCOUNTER — Ambulatory Visit
Admission: RE | Admit: 2022-06-30 | Discharge: 2022-06-30 | Disposition: A | Discharge status: Home / Self Care | Payer: Medicare Other | Source: Ambulatory Visit | Attending: Radiation Oncology | Admitting: Radiation Oncology

## 2022-06-30 ENCOUNTER — Ambulatory Visit: Payer: Medicare Other

## 2022-06-30 ENCOUNTER — Inpatient Hospital Stay: Payer: Medicare Other

## 2022-06-30 ENCOUNTER — Other Ambulatory Visit: Payer: Self-pay

## 2022-06-30 DIAGNOSIS — Z170 Estrogen receptor positive status [ER+]: Secondary | ICD-10-CM

## 2022-06-30 DIAGNOSIS — Z7901 Long term (current) use of anticoagulants: Secondary | ICD-10-CM | POA: Diagnosis not present

## 2022-06-30 DIAGNOSIS — Z86711 Personal history of pulmonary embolism: Secondary | ICD-10-CM | POA: Diagnosis not present

## 2022-06-30 DIAGNOSIS — Z87891 Personal history of nicotine dependence: Secondary | ICD-10-CM | POA: Diagnosis not present

## 2022-06-30 DIAGNOSIS — M8580 Other specified disorders of bone density and structure, unspecified site: Secondary | ICD-10-CM | POA: Diagnosis not present

## 2022-06-30 DIAGNOSIS — G629 Polyneuropathy, unspecified: Secondary | ICD-10-CM | POA: Diagnosis not present

## 2022-06-30 DIAGNOSIS — I10 Essential (primary) hypertension: Secondary | ICD-10-CM | POA: Diagnosis not present

## 2022-06-30 DIAGNOSIS — Z79899 Other long term (current) drug therapy: Secondary | ICD-10-CM | POA: Diagnosis not present

## 2022-06-30 DIAGNOSIS — C50811 Malignant neoplasm of overlapping sites of right female breast: Secondary | ICD-10-CM | POA: Diagnosis not present

## 2022-06-30 DIAGNOSIS — Z800 Family history of malignant neoplasm of digestive organs: Secondary | ICD-10-CM | POA: Diagnosis not present

## 2022-06-30 DIAGNOSIS — Z510 Encounter for antineoplastic radiation therapy: Secondary | ICD-10-CM | POA: Diagnosis not present

## 2022-06-30 LAB — CBC
HCT: 33.7 % — ABNORMAL LOW (ref 36.0–46.0)
Hemoglobin: 10.6 g/dL — ABNORMAL LOW (ref 12.0–15.0)
MCH: 33.2 pg (ref 26.0–34.0)
MCHC: 31.5 g/dL (ref 30.0–36.0)
MCV: 105.6 fL — ABNORMAL HIGH (ref 80.0–100.0)
Platelets: 230 10*3/uL (ref 150–400)
RBC: 3.19 MIL/uL — ABNORMAL LOW (ref 3.87–5.11)
RDW: 15.4 % (ref 11.5–15.5)
WBC: 5.3 10*3/uL (ref 4.0–10.5)
nRBC: 0 % (ref 0.0–0.2)

## 2022-06-30 LAB — RAD ONC ARIA SESSION SUMMARY
Course Elapsed Days: 17
Plan Fractions Treated to Date: 14
Plan Prescribed Dose Per Fraction: 1.8 Gy
Plan Total Fractions Prescribed: 28
Plan Total Prescribed Dose: 50.4 Gy
Reference Point Dosage Given to Date: 25.2 Gy
Reference Point Session Dosage Given: 1.8 Gy
Session Number: 14

## 2022-07-01 ENCOUNTER — Ambulatory Visit: Payer: Medicare Other

## 2022-07-01 ENCOUNTER — Ambulatory Visit
Admission: RE | Admit: 2022-07-01 | Discharge: 2022-07-01 | Disposition: A | Discharge status: Home / Self Care | Payer: Medicare Other | Source: Ambulatory Visit | Attending: Radiation Oncology | Admitting: Radiation Oncology

## 2022-07-01 ENCOUNTER — Other Ambulatory Visit: Payer: Self-pay

## 2022-07-01 DIAGNOSIS — Z510 Encounter for antineoplastic radiation therapy: Secondary | ICD-10-CM | POA: Diagnosis not present

## 2022-07-01 DIAGNOSIS — C50811 Malignant neoplasm of overlapping sites of right female breast: Secondary | ICD-10-CM | POA: Diagnosis not present

## 2022-07-01 DIAGNOSIS — Z170 Estrogen receptor positive status [ER+]: Secondary | ICD-10-CM | POA: Diagnosis not present

## 2022-07-01 LAB — RAD ONC ARIA SESSION SUMMARY
Course Elapsed Days: 18
Plan Fractions Treated to Date: 15
Plan Prescribed Dose Per Fraction: 1.8 Gy
Plan Total Fractions Prescribed: 28
Plan Total Prescribed Dose: 50.4 Gy
Reference Point Dosage Given to Date: 27 Gy
Reference Point Session Dosage Given: 1.8 Gy
Session Number: 15

## 2022-07-03 ENCOUNTER — Other Ambulatory Visit: Payer: Self-pay | Admitting: Family Medicine

## 2022-07-04 ENCOUNTER — Ambulatory Visit
Admission: RE | Admit: 2022-07-04 | Discharge: 2022-07-04 | Disposition: A | Discharge status: Home / Self Care | Payer: Medicare Other | Source: Ambulatory Visit | Attending: Radiation Oncology | Admitting: Radiation Oncology

## 2022-07-04 ENCOUNTER — Ambulatory Visit: Payer: Medicare Other

## 2022-07-04 ENCOUNTER — Other Ambulatory Visit: Payer: Self-pay

## 2022-07-04 DIAGNOSIS — Z510 Encounter for antineoplastic radiation therapy: Secondary | ICD-10-CM | POA: Diagnosis not present

## 2022-07-04 DIAGNOSIS — Z170 Estrogen receptor positive status [ER+]: Secondary | ICD-10-CM | POA: Diagnosis not present

## 2022-07-04 DIAGNOSIS — C50811 Malignant neoplasm of overlapping sites of right female breast: Secondary | ICD-10-CM | POA: Diagnosis not present

## 2022-07-04 LAB — RAD ONC ARIA SESSION SUMMARY
Course Elapsed Days: 21
Plan Fractions Treated to Date: 16
Plan Prescribed Dose Per Fraction: 1.8 Gy
Plan Total Fractions Prescribed: 28
Plan Total Prescribed Dose: 50.4 Gy
Reference Point Dosage Given to Date: 28.8 Gy
Reference Point Session Dosage Given: 1.8 Gy
Session Number: 16

## 2022-07-05 ENCOUNTER — Ambulatory Visit: Payer: Medicare Other

## 2022-07-05 ENCOUNTER — Other Ambulatory Visit: Payer: Self-pay

## 2022-07-05 ENCOUNTER — Ambulatory Visit
Admission: RE | Admit: 2022-07-05 | Discharge: 2022-07-05 | Disposition: A | Discharge status: Home / Self Care | Payer: Medicare Other | Source: Ambulatory Visit | Attending: Radiation Oncology | Admitting: Radiation Oncology

## 2022-07-05 DIAGNOSIS — Z510 Encounter for antineoplastic radiation therapy: Secondary | ICD-10-CM | POA: Diagnosis not present

## 2022-07-05 DIAGNOSIS — Z170 Estrogen receptor positive status [ER+]: Secondary | ICD-10-CM | POA: Diagnosis not present

## 2022-07-05 DIAGNOSIS — C50811 Malignant neoplasm of overlapping sites of right female breast: Secondary | ICD-10-CM | POA: Diagnosis not present

## 2022-07-05 LAB — RAD ONC ARIA SESSION SUMMARY
Course Elapsed Days: 22
Plan Fractions Treated to Date: 17
Plan Prescribed Dose Per Fraction: 1.8 Gy
Plan Total Fractions Prescribed: 28
Plan Total Prescribed Dose: 50.4 Gy
Reference Point Dosage Given to Date: 30.6 Gy
Reference Point Session Dosage Given: 1.8 Gy
Session Number: 17

## 2022-07-06 ENCOUNTER — Ambulatory Visit
Admission: RE | Admit: 2022-07-06 | Discharge: 2022-07-06 | Disposition: A | Discharge status: Home / Self Care | Payer: Medicare Other | Source: Ambulatory Visit | Attending: Radiation Oncology | Admitting: Radiation Oncology

## 2022-07-06 ENCOUNTER — Ambulatory Visit: Payer: Medicare Other

## 2022-07-06 ENCOUNTER — Other Ambulatory Visit: Payer: Self-pay

## 2022-07-06 DIAGNOSIS — Z170 Estrogen receptor positive status [ER+]: Secondary | ICD-10-CM | POA: Diagnosis not present

## 2022-07-06 DIAGNOSIS — Z510 Encounter for antineoplastic radiation therapy: Secondary | ICD-10-CM | POA: Diagnosis not present

## 2022-07-06 DIAGNOSIS — C50811 Malignant neoplasm of overlapping sites of right female breast: Secondary | ICD-10-CM | POA: Diagnosis not present

## 2022-07-06 LAB — RAD ONC ARIA SESSION SUMMARY
Course Elapsed Days: 23
Plan Fractions Treated to Date: 18
Plan Prescribed Dose Per Fraction: 1.8 Gy
Plan Total Fractions Prescribed: 28
Plan Total Prescribed Dose: 50.4 Gy
Reference Point Dosage Given to Date: 32.4 Gy
Reference Point Session Dosage Given: 1.8 Gy
Session Number: 18

## 2022-07-07 ENCOUNTER — Ambulatory Visit
Admission: RE | Admit: 2022-07-07 | Discharge: 2022-07-07 | Disposition: A | Discharge status: Home / Self Care | Payer: Medicare Other | Source: Ambulatory Visit | Attending: Radiation Oncology | Admitting: Radiation Oncology

## 2022-07-07 ENCOUNTER — Other Ambulatory Visit: Payer: Self-pay

## 2022-07-07 ENCOUNTER — Ambulatory Visit: Payer: Medicare Other

## 2022-07-07 DIAGNOSIS — Z510 Encounter for antineoplastic radiation therapy: Secondary | ICD-10-CM | POA: Diagnosis not present

## 2022-07-07 DIAGNOSIS — Z170 Estrogen receptor positive status [ER+]: Secondary | ICD-10-CM | POA: Diagnosis not present

## 2022-07-07 DIAGNOSIS — C50811 Malignant neoplasm of overlapping sites of right female breast: Secondary | ICD-10-CM | POA: Diagnosis not present

## 2022-07-07 LAB — RAD ONC ARIA SESSION SUMMARY
Course Elapsed Days: 24
Plan Fractions Treated to Date: 19
Plan Prescribed Dose Per Fraction: 1.8 Gy
Plan Total Fractions Prescribed: 28
Plan Total Prescribed Dose: 50.4 Gy
Reference Point Dosage Given to Date: 34.2 Gy
Reference Point Session Dosage Given: 1.8 Gy
Session Number: 19

## 2022-07-08 ENCOUNTER — Ambulatory Visit
Admission: RE | Admit: 2022-07-08 | Discharge: 2022-07-08 | Disposition: A | Discharge status: Home / Self Care | Payer: Medicare Other | Source: Ambulatory Visit | Attending: Radiation Oncology | Admitting: Radiation Oncology

## 2022-07-08 ENCOUNTER — Ambulatory Visit: Payer: Medicare Other

## 2022-07-08 ENCOUNTER — Other Ambulatory Visit: Payer: Self-pay

## 2022-07-08 DIAGNOSIS — Z170 Estrogen receptor positive status [ER+]: Secondary | ICD-10-CM | POA: Diagnosis not present

## 2022-07-08 DIAGNOSIS — Z510 Encounter for antineoplastic radiation therapy: Secondary | ICD-10-CM | POA: Diagnosis not present

## 2022-07-08 DIAGNOSIS — C50811 Malignant neoplasm of overlapping sites of right female breast: Secondary | ICD-10-CM | POA: Diagnosis not present

## 2022-07-08 LAB — RAD ONC ARIA SESSION SUMMARY
Course Elapsed Days: 25
Plan Fractions Treated to Date: 20
Plan Prescribed Dose Per Fraction: 1.8 Gy
Plan Total Fractions Prescribed: 28
Plan Total Prescribed Dose: 50.4 Gy
Reference Point Dosage Given to Date: 36 Gy
Reference Point Session Dosage Given: 1.8 Gy
Session Number: 20

## 2022-07-11 ENCOUNTER — Ambulatory Visit
Admission: RE | Admit: 2022-07-11 | Discharge: 2022-07-11 | Disposition: A | Discharge status: Home / Self Care | Payer: Medicare Other | Source: Ambulatory Visit | Attending: Radiation Oncology | Admitting: Radiation Oncology

## 2022-07-11 ENCOUNTER — Ambulatory Visit: Payer: Medicare Other

## 2022-07-11 ENCOUNTER — Other Ambulatory Visit: Payer: Self-pay

## 2022-07-11 DIAGNOSIS — Z510 Encounter for antineoplastic radiation therapy: Secondary | ICD-10-CM | POA: Diagnosis not present

## 2022-07-11 DIAGNOSIS — C50811 Malignant neoplasm of overlapping sites of right female breast: Secondary | ICD-10-CM | POA: Diagnosis not present

## 2022-07-11 DIAGNOSIS — Z170 Estrogen receptor positive status [ER+]: Secondary | ICD-10-CM | POA: Diagnosis not present

## 2022-07-11 LAB — RAD ONC ARIA SESSION SUMMARY
Course Elapsed Days: 28
Plan Fractions Treated to Date: 21
Plan Prescribed Dose Per Fraction: 1.8 Gy
Plan Total Fractions Prescribed: 28
Plan Total Prescribed Dose: 50.4 Gy
Reference Point Dosage Given to Date: 37.8 Gy
Reference Point Session Dosage Given: 1.8 Gy
Session Number: 21

## 2022-07-12 ENCOUNTER — Other Ambulatory Visit: Payer: Self-pay

## 2022-07-12 ENCOUNTER — Ambulatory Visit
Admission: RE | Admit: 2022-07-12 | Discharge: 2022-07-12 | Disposition: A | Discharge status: Home / Self Care | Payer: Medicare Other | Source: Ambulatory Visit | Attending: Radiation Oncology | Admitting: Radiation Oncology

## 2022-07-12 ENCOUNTER — Ambulatory Visit: Payer: Medicare Other

## 2022-07-12 DIAGNOSIS — Z170 Estrogen receptor positive status [ER+]: Secondary | ICD-10-CM | POA: Diagnosis not present

## 2022-07-12 DIAGNOSIS — C50811 Malignant neoplasm of overlapping sites of right female breast: Secondary | ICD-10-CM | POA: Diagnosis not present

## 2022-07-12 DIAGNOSIS — Z510 Encounter for antineoplastic radiation therapy: Secondary | ICD-10-CM | POA: Diagnosis not present

## 2022-07-12 LAB — RAD ONC ARIA SESSION SUMMARY
Course Elapsed Days: 29
Plan Fractions Treated to Date: 22
Plan Prescribed Dose Per Fraction: 1.8 Gy
Plan Total Fractions Prescribed: 28
Plan Total Prescribed Dose: 50.4 Gy
Reference Point Dosage Given to Date: 39.6 Gy
Reference Point Session Dosage Given: 1.8 Gy
Session Number: 22

## 2022-07-13 ENCOUNTER — Other Ambulatory Visit: Payer: Self-pay

## 2022-07-13 ENCOUNTER — Ambulatory Visit: Payer: Medicare Other

## 2022-07-13 ENCOUNTER — Ambulatory Visit
Admission: RE | Admit: 2022-07-13 | Discharge: 2022-07-13 | Disposition: A | Discharge status: Home / Self Care | Payer: Medicare Other | Source: Ambulatory Visit | Attending: Radiation Oncology | Admitting: Radiation Oncology

## 2022-07-13 DIAGNOSIS — C50811 Malignant neoplasm of overlapping sites of right female breast: Secondary | ICD-10-CM | POA: Diagnosis not present

## 2022-07-13 DIAGNOSIS — Z510 Encounter for antineoplastic radiation therapy: Secondary | ICD-10-CM | POA: Diagnosis not present

## 2022-07-13 DIAGNOSIS — Z170 Estrogen receptor positive status [ER+]: Secondary | ICD-10-CM | POA: Diagnosis not present

## 2022-07-13 LAB — RAD ONC ARIA SESSION SUMMARY
Course Elapsed Days: 30
Plan Fractions Treated to Date: 23
Plan Prescribed Dose Per Fraction: 1.8 Gy
Plan Total Fractions Prescribed: 28
Plan Total Prescribed Dose: 50.4 Gy
Reference Point Dosage Given to Date: 41.4 Gy
Reference Point Session Dosage Given: 1.8 Gy
Session Number: 23

## 2022-07-14 ENCOUNTER — Other Ambulatory Visit: Payer: Self-pay

## 2022-07-14 ENCOUNTER — Ambulatory Visit: Payer: Medicare Other

## 2022-07-14 ENCOUNTER — Inpatient Hospital Stay: Payer: Medicare Other

## 2022-07-14 ENCOUNTER — Ambulatory Visit
Admission: RE | Admit: 2022-07-14 | Discharge: 2022-07-14 | Disposition: A | Discharge status: Home / Self Care | Payer: Medicare Other | Source: Ambulatory Visit | Attending: Internal Medicine | Admitting: Internal Medicine

## 2022-07-14 ENCOUNTER — Ambulatory Visit
Admission: RE | Admit: 2022-07-14 | Discharge: 2022-07-14 | Disposition: A | Discharge status: Home / Self Care | Payer: Medicare Other | Source: Ambulatory Visit | Attending: Radiation Oncology | Admitting: Radiation Oncology

## 2022-07-14 DIAGNOSIS — C50811 Malignant neoplasm of overlapping sites of right female breast: Secondary | ICD-10-CM | POA: Diagnosis not present

## 2022-07-14 DIAGNOSIS — Z7901 Long term (current) use of anticoagulants: Secondary | ICD-10-CM | POA: Diagnosis not present

## 2022-07-14 DIAGNOSIS — M85851 Other specified disorders of bone density and structure, right thigh: Secondary | ICD-10-CM | POA: Diagnosis not present

## 2022-07-14 DIAGNOSIS — Z780 Asymptomatic menopausal state: Secondary | ICD-10-CM | POA: Insufficient documentation

## 2022-07-14 DIAGNOSIS — Z170 Estrogen receptor positive status [ER+]: Secondary | ICD-10-CM

## 2022-07-14 DIAGNOSIS — Z510 Encounter for antineoplastic radiation therapy: Secondary | ICD-10-CM | POA: Diagnosis not present

## 2022-07-14 DIAGNOSIS — M8589 Other specified disorders of bone density and structure, multiple sites: Secondary | ICD-10-CM | POA: Diagnosis not present

## 2022-07-14 DIAGNOSIS — Z800 Family history of malignant neoplasm of digestive organs: Secondary | ICD-10-CM | POA: Diagnosis not present

## 2022-07-14 DIAGNOSIS — Z86711 Personal history of pulmonary embolism: Secondary | ICD-10-CM | POA: Diagnosis not present

## 2022-07-14 DIAGNOSIS — G629 Polyneuropathy, unspecified: Secondary | ICD-10-CM | POA: Diagnosis not present

## 2022-07-14 DIAGNOSIS — I10 Essential (primary) hypertension: Secondary | ICD-10-CM | POA: Diagnosis not present

## 2022-07-14 DIAGNOSIS — M8580 Other specified disorders of bone density and structure, unspecified site: Secondary | ICD-10-CM | POA: Diagnosis not present

## 2022-07-14 DIAGNOSIS — Z79899 Other long term (current) drug therapy: Secondary | ICD-10-CM | POA: Diagnosis not present

## 2022-07-14 DIAGNOSIS — Z87891 Personal history of nicotine dependence: Secondary | ICD-10-CM | POA: Diagnosis not present

## 2022-07-14 LAB — RAD ONC ARIA SESSION SUMMARY
Course Elapsed Days: 31
Plan Fractions Treated to Date: 24
Plan Prescribed Dose Per Fraction: 1.8 Gy
Plan Total Fractions Prescribed: 28
Plan Total Prescribed Dose: 50.4 Gy
Reference Point Dosage Given to Date: 43.2 Gy
Reference Point Session Dosage Given: 1.8 Gy
Session Number: 24

## 2022-07-14 LAB — CBC
HCT: 34.7 % — ABNORMAL LOW (ref 36.0–46.0)
Hemoglobin: 11.3 g/dL — ABNORMAL LOW (ref 12.0–15.0)
MCH: 33.7 pg (ref 26.0–34.0)
MCHC: 32.6 g/dL (ref 30.0–36.0)
MCV: 103.6 fL — ABNORMAL HIGH (ref 80.0–100.0)
Platelets: 200 10*3/uL (ref 150–400)
RBC: 3.35 MIL/uL — ABNORMAL LOW (ref 3.87–5.11)
RDW: 14.8 % (ref 11.5–15.5)
WBC: 5.8 10*3/uL (ref 4.0–10.5)
nRBC: 0 % (ref 0.0–0.2)

## 2022-07-15 ENCOUNTER — Other Ambulatory Visit: Payer: Self-pay

## 2022-07-15 ENCOUNTER — Ambulatory Visit
Admission: RE | Admit: 2022-07-15 | Discharge: 2022-07-15 | Disposition: A | Discharge status: Home / Self Care | Payer: Medicare Other | Source: Ambulatory Visit | Attending: Radiation Oncology | Admitting: Radiation Oncology

## 2022-07-15 ENCOUNTER — Ambulatory Visit: Payer: Medicare Other

## 2022-07-15 DIAGNOSIS — Z510 Encounter for antineoplastic radiation therapy: Secondary | ICD-10-CM | POA: Diagnosis not present

## 2022-07-15 DIAGNOSIS — C50811 Malignant neoplasm of overlapping sites of right female breast: Secondary | ICD-10-CM | POA: Diagnosis not present

## 2022-07-15 DIAGNOSIS — Z170 Estrogen receptor positive status [ER+]: Secondary | ICD-10-CM | POA: Diagnosis not present

## 2022-07-15 LAB — RAD ONC ARIA SESSION SUMMARY
Course Elapsed Days: 32
Plan Fractions Treated to Date: 25
Plan Prescribed Dose Per Fraction: 1.8 Gy
Plan Total Fractions Prescribed: 28
Plan Total Prescribed Dose: 50.4 Gy
Reference Point Dosage Given to Date: 45 Gy
Reference Point Session Dosage Given: 1.8 Gy
Session Number: 25

## 2022-07-18 ENCOUNTER — Other Ambulatory Visit: Payer: Self-pay | Admitting: *Deleted

## 2022-07-18 ENCOUNTER — Ambulatory Visit
Admission: RE | Admit: 2022-07-18 | Discharge: 2022-07-18 | Disposition: A | Discharge status: Home / Self Care | Payer: Medicare Other | Source: Ambulatory Visit | Attending: Radiation Oncology | Admitting: Radiation Oncology

## 2022-07-18 ENCOUNTER — Other Ambulatory Visit: Payer: Self-pay

## 2022-07-18 DIAGNOSIS — Z170 Estrogen receptor positive status [ER+]: Secondary | ICD-10-CM | POA: Diagnosis not present

## 2022-07-18 DIAGNOSIS — Z510 Encounter for antineoplastic radiation therapy: Secondary | ICD-10-CM | POA: Diagnosis not present

## 2022-07-18 DIAGNOSIS — C50811 Malignant neoplasm of overlapping sites of right female breast: Secondary | ICD-10-CM | POA: Diagnosis not present

## 2022-07-18 LAB — RAD ONC ARIA SESSION SUMMARY
Course Elapsed Days: 35
Plan Fractions Treated to Date: 26
Plan Prescribed Dose Per Fraction: 1.8 Gy
Plan Total Fractions Prescribed: 28
Plan Total Prescribed Dose: 50.4 Gy
Reference Point Dosage Given to Date: 46.8 Gy
Reference Point Session Dosage Given: 1.8 Gy
Session Number: 26

## 2022-07-18 MED ORDER — SILVER SULFADIAZINE 1 % EX CREA: 1.0000 | TOPICAL_CREAM | Freq: Two times a day (BID) | CUTANEOUS | 1 refills | Status: DC

## 2022-07-19 ENCOUNTER — Other Ambulatory Visit: Payer: Self-pay

## 2022-07-19 ENCOUNTER — Ambulatory Visit
Admission: RE | Admit: 2022-07-19 | Discharge: 2022-07-19 | Disposition: A | Discharge status: Home / Self Care | Payer: Medicare Other | Source: Ambulatory Visit | Attending: Radiation Oncology | Admitting: Radiation Oncology

## 2022-07-19 DIAGNOSIS — Z170 Estrogen receptor positive status [ER+]: Secondary | ICD-10-CM | POA: Diagnosis not present

## 2022-07-19 DIAGNOSIS — Z510 Encounter for antineoplastic radiation therapy: Secondary | ICD-10-CM | POA: Diagnosis not present

## 2022-07-19 DIAGNOSIS — C50811 Malignant neoplasm of overlapping sites of right female breast: Secondary | ICD-10-CM | POA: Diagnosis not present

## 2022-07-19 LAB — RAD ONC ARIA SESSION SUMMARY
Course Elapsed Days: 36
Plan Fractions Treated to Date: 27
Plan Prescribed Dose Per Fraction: 1.8 Gy
Plan Total Fractions Prescribed: 28
Plan Total Prescribed Dose: 50.4 Gy
Reference Point Dosage Given to Date: 48.6 Gy
Reference Point Session Dosage Given: 1.8 Gy
Session Number: 27

## 2022-07-20 ENCOUNTER — Ambulatory Visit: Admission: RE | Admit: 2022-07-20 | Payer: Medicare Other | Source: Ambulatory Visit

## 2022-07-20 ENCOUNTER — Other Ambulatory Visit: Payer: Self-pay

## 2022-07-20 ENCOUNTER — Ambulatory Visit
Admission: RE | Admit: 2022-07-20 | Discharge: 2022-07-20 | Disposition: A | Discharge status: Home / Self Care | Payer: Medicare Other | Source: Ambulatory Visit | Attending: Radiation Oncology | Admitting: Radiation Oncology

## 2022-07-20 DIAGNOSIS — Z170 Estrogen receptor positive status [ER+]: Secondary | ICD-10-CM | POA: Diagnosis not present

## 2022-07-20 DIAGNOSIS — C50811 Malignant neoplasm of overlapping sites of right female breast: Secondary | ICD-10-CM | POA: Diagnosis not present

## 2022-07-20 DIAGNOSIS — Z510 Encounter for antineoplastic radiation therapy: Secondary | ICD-10-CM | POA: Diagnosis not present

## 2022-07-20 LAB — RAD ONC ARIA SESSION SUMMARY
Course Elapsed Days: 37
Plan Fractions Treated to Date: 28
Plan Prescribed Dose Per Fraction: 1.8 Gy
Plan Total Fractions Prescribed: 28
Plan Total Prescribed Dose: 50.4 Gy
Reference Point Dosage Given to Date: 50.4 Gy
Reference Point Session Dosage Given: 1.8 Gy
Session Number: 28

## 2022-07-21 ENCOUNTER — Ambulatory Visit
Admission: RE | Admit: 2022-07-21 | Discharge: 2022-07-21 | Disposition: A | Discharge status: Home / Self Care | Payer: Medicare Other | Source: Ambulatory Visit | Attending: Radiation Oncology | Admitting: Radiation Oncology

## 2022-07-21 ENCOUNTER — Other Ambulatory Visit: Payer: Self-pay

## 2022-07-21 DIAGNOSIS — Z170 Estrogen receptor positive status [ER+]: Secondary | ICD-10-CM | POA: Diagnosis not present

## 2022-07-21 DIAGNOSIS — Z510 Encounter for antineoplastic radiation therapy: Secondary | ICD-10-CM | POA: Diagnosis not present

## 2022-07-21 DIAGNOSIS — C50811 Malignant neoplasm of overlapping sites of right female breast: Secondary | ICD-10-CM | POA: Diagnosis not present

## 2022-07-21 LAB — RAD ONC ARIA SESSION SUMMARY
Course Elapsed Days: 38
Plan Fractions Treated to Date: 1
Plan Prescribed Dose Per Fraction: 2 Gy
Plan Total Fractions Prescribed: 5
Plan Total Prescribed Dose: 10 Gy
Reference Point Dosage Given to Date: 2 Gy
Reference Point Session Dosage Given: 2 Gy
Session Number: 29

## 2022-07-22 ENCOUNTER — Other Ambulatory Visit: Payer: Self-pay

## 2022-07-22 ENCOUNTER — Ambulatory Visit
Admission: RE | Admit: 2022-07-22 | Discharge: 2022-07-22 | Disposition: A | Discharge status: Home / Self Care | Payer: Medicare Other | Source: Ambulatory Visit | Attending: Radiation Oncology | Admitting: Radiation Oncology

## 2022-07-22 DIAGNOSIS — Z510 Encounter for antineoplastic radiation therapy: Secondary | ICD-10-CM | POA: Diagnosis not present

## 2022-07-22 DIAGNOSIS — Z170 Estrogen receptor positive status [ER+]: Secondary | ICD-10-CM | POA: Diagnosis not present

## 2022-07-22 DIAGNOSIS — C50811 Malignant neoplasm of overlapping sites of right female breast: Secondary | ICD-10-CM | POA: Diagnosis not present

## 2022-07-22 LAB — RAD ONC ARIA SESSION SUMMARY
Course Elapsed Days: 39
Plan Fractions Treated to Date: 2
Plan Prescribed Dose Per Fraction: 2 Gy
Plan Total Fractions Prescribed: 5
Plan Total Prescribed Dose: 10 Gy
Reference Point Dosage Given to Date: 4 Gy
Reference Point Session Dosage Given: 2 Gy
Session Number: 30

## 2022-07-25 ENCOUNTER — Other Ambulatory Visit: Payer: Self-pay

## 2022-07-25 ENCOUNTER — Ambulatory Visit
Admission: RE | Admit: 2022-07-25 | Discharge: 2022-07-25 | Disposition: A | Discharge status: Home / Self Care | Payer: Medicare Other | Source: Ambulatory Visit | Attending: Radiation Oncology | Admitting: Radiation Oncology

## 2022-07-25 DIAGNOSIS — Z510 Encounter for antineoplastic radiation therapy: Secondary | ICD-10-CM | POA: Diagnosis not present

## 2022-07-25 DIAGNOSIS — Z170 Estrogen receptor positive status [ER+]: Secondary | ICD-10-CM | POA: Insufficient documentation

## 2022-07-25 DIAGNOSIS — C50811 Malignant neoplasm of overlapping sites of right female breast: Secondary | ICD-10-CM | POA: Diagnosis not present

## 2022-07-25 LAB — RAD ONC ARIA SESSION SUMMARY
Course Elapsed Days: 42
Plan Fractions Treated to Date: 3
Plan Prescribed Dose Per Fraction: 2 Gy
Plan Total Fractions Prescribed: 5
Plan Total Prescribed Dose: 10 Gy
Reference Point Dosage Given to Date: 6 Gy
Reference Point Session Dosage Given: 2 Gy
Session Number: 31

## 2022-07-26 ENCOUNTER — Ambulatory Visit
Admission: RE | Admit: 2022-07-26 | Discharge: 2022-07-26 | Disposition: A | Discharge status: Home / Self Care | Payer: Medicare Other | Source: Ambulatory Visit | Attending: Radiation Oncology | Admitting: Radiation Oncology

## 2022-07-26 ENCOUNTER — Other Ambulatory Visit: Payer: Self-pay

## 2022-07-26 DIAGNOSIS — C50811 Malignant neoplasm of overlapping sites of right female breast: Secondary | ICD-10-CM | POA: Diagnosis not present

## 2022-07-26 DIAGNOSIS — Z170 Estrogen receptor positive status [ER+]: Secondary | ICD-10-CM | POA: Diagnosis not present

## 2022-07-26 DIAGNOSIS — Z510 Encounter for antineoplastic radiation therapy: Secondary | ICD-10-CM | POA: Diagnosis not present

## 2022-07-26 LAB — RAD ONC ARIA SESSION SUMMARY
Course Elapsed Days: 43
Plan Fractions Treated to Date: 4
Plan Prescribed Dose Per Fraction: 2 Gy
Plan Total Fractions Prescribed: 5
Plan Total Prescribed Dose: 10 Gy
Reference Point Dosage Given to Date: 8 Gy
Reference Point Session Dosage Given: 2 Gy
Session Number: 32

## 2022-07-27 ENCOUNTER — Other Ambulatory Visit: Payer: Self-pay

## 2022-07-27 ENCOUNTER — Encounter: Payer: Self-pay | Admitting: *Deleted

## 2022-07-27 ENCOUNTER — Ambulatory Visit
Admission: RE | Admit: 2022-07-27 | Discharge: 2022-07-27 | Disposition: A | Discharge status: Home / Self Care | Payer: Medicare Other | Source: Ambulatory Visit | Attending: Radiation Oncology | Admitting: Radiation Oncology

## 2022-07-27 DIAGNOSIS — Z170 Estrogen receptor positive status [ER+]: Secondary | ICD-10-CM | POA: Diagnosis not present

## 2022-07-27 DIAGNOSIS — C50811 Malignant neoplasm of overlapping sites of right female breast: Secondary | ICD-10-CM | POA: Diagnosis not present

## 2022-07-27 DIAGNOSIS — Z510 Encounter for antineoplastic radiation therapy: Secondary | ICD-10-CM | POA: Diagnosis not present

## 2022-07-27 LAB — RAD ONC ARIA SESSION SUMMARY
Course Elapsed Days: 44
Plan Fractions Treated to Date: 5
Plan Prescribed Dose Per Fraction: 2 Gy
Plan Total Fractions Prescribed: 5
Plan Total Prescribed Dose: 10 Gy
Reference Point Dosage Given to Date: 10 Gy
Reference Point Session Dosage Given: 2 Gy
Session Number: 33

## 2022-07-28 ENCOUNTER — Telehealth: Payer: Self-pay

## 2022-07-28 ENCOUNTER — Inpatient Hospital Stay: Payer: Medicare Other | Admitting: Licensed Clinical Social Worker

## 2022-07-28 ENCOUNTER — Ambulatory Visit: Payer: Medicare Other

## 2022-07-28 ENCOUNTER — Inpatient Hospital Stay: Payer: Medicare Other

## 2022-07-28 NOTE — Telephone Encounter (Signed)
Pt left message requesting help with application.   Attempted to return patients question, left vm.

## 2022-07-28 NOTE — Telephone Encounter (Signed)
-----   Message from Darlina Guys, [facility] sent at 06/09/2022 10:44 AM EST ----- Doristine Devoid, I will call her now.  Thank you for your help! ----- Message ----- From: Delene Ruffini, CPhT Sent: 06/09/2022  10:38 AM EST To: Darlina Guys, RPH  You can give the heads up if you'd like. I will go ahead and mail the application with a letter of all documentation needed! ----- Message ----- From: Darlina Guys, [facility] Sent: 06/08/2022   3:34 PM EST To: Delene Ruffini, CPhT  Gotcha- do you all notify her of that, or should I call and give her a head's up?!  I will know to mention that moving forward with those particular manufacturers :) ----- Message ----- From: Delene Ruffini, CPhT Sent: 06/08/2022   3:26 PM EST To: Darlina Guys, [facility]  The patient will need to get a printout of how much she's spent at the pharmacy for this year only. This will need to be submitted with the application :) ----- Message ----- From: Darlina Guys, [facility] Sent: 06/07/2022   2:31 PM EST To: Delene Ruffini, CPhT  She is aware of that stipulation and did still wish to apply.  Is there a way we can verify that has been met prior to completing the application?  I'm still new to this! ----- Message ----- From: Delene Ruffini, CPhT Sent: 06/07/2022  12:22 PM EST To: Darlina Guys, [facility]; Rx Med Assistance Team  Good afternoon  If patient is part D (which it looks like she is) she will need to have spent 3% of her income on out of pocket med costs for the current year to qualify for assistance with this company ([employer]). Most patients havent hit this amount at this time of year. However, if she has we can definitely move forward! ----- Message ----- From: Darlina Guys, [facility] Sent: 06/07/2022  11:46 AM EST To: Rx Med Assistance Team  Can you all please help this patient with a medication assistance application for her Eliquis?  Thank you!

## 2022-08-02 DIAGNOSIS — C50011 Malignant neoplasm of nipple and areola, right female breast: Secondary | ICD-10-CM | POA: Diagnosis not present

## 2022-08-10 ENCOUNTER — Inpatient Hospital Stay (HOSPITAL_BASED_OUTPATIENT_CLINIC_OR_DEPARTMENT_OTHER): Payer: Medicare Other | Admitting: Internal Medicine

## 2022-08-10 ENCOUNTER — Inpatient Hospital Stay: Payer: Medicare Other | Attending: Internal Medicine

## 2022-08-10 ENCOUNTER — Encounter: Payer: Self-pay | Admitting: Internal Medicine

## 2022-08-10 VITALS — BP 148/84 | HR 76 | Temp 97.0°F | Resp 17 | Wt 179.0 lb

## 2022-08-10 DIAGNOSIS — Z79899 Other long term (current) drug therapy: Secondary | ICD-10-CM | POA: Insufficient documentation

## 2022-08-10 DIAGNOSIS — Z79811 Long term (current) use of aromatase inhibitors: Secondary | ICD-10-CM | POA: Insufficient documentation

## 2022-08-10 DIAGNOSIS — Z7901 Long term (current) use of anticoagulants: Secondary | ICD-10-CM | POA: Insufficient documentation

## 2022-08-10 DIAGNOSIS — Z87891 Personal history of nicotine dependence: Secondary | ICD-10-CM | POA: Diagnosis not present

## 2022-08-10 DIAGNOSIS — Z923 Personal history of irradiation: Secondary | ICD-10-CM | POA: Insufficient documentation

## 2022-08-10 DIAGNOSIS — Z170 Estrogen receptor positive status [ER+]: Secondary | ICD-10-CM | POA: Insufficient documentation

## 2022-08-10 DIAGNOSIS — C50811 Malignant neoplasm of overlapping sites of right female breast: Secondary | ICD-10-CM | POA: Diagnosis not present

## 2022-08-10 DIAGNOSIS — M8580 Other specified disorders of bone density and structure, unspecified site: Secondary | ICD-10-CM | POA: Insufficient documentation

## 2022-08-10 LAB — CMP (CANCER CENTER ONLY)
ALT: 15 U/L (ref 0–44)
AST: 24 U/L (ref 15–41)
Albumin: 4.1 g/dL (ref 3.5–5.0)
Alkaline Phosphatase: 64 U/L (ref 38–126)
Anion gap: 8 (ref 5–15)
BUN: 19 mg/dL (ref 8–23)
CO2: 26 mmol/L (ref 22–32)
Calcium: 9.8 mg/dL (ref 8.9–10.3)
Chloride: 102 mmol/L (ref 98–111)
Creatinine: 0.88 mg/dL (ref 0.44–1.00)
GFR, Estimated: >60 mL/min (ref >60)
Glucose, Bld: 136 mg/dL — ABNORMAL HIGH (ref 70–99)
Potassium: 3.6 mmol/L (ref 3.5–5.1)
Sodium: 136 mmol/L (ref 135–145)
Total Bilirubin: 0.5 mg/dL (ref 0.3–1.2)
Total Protein: 6.7 g/dL (ref 6.5–8.1)

## 2022-08-10 LAB — CBC WITH DIFFERENTIAL (CANCER CENTER ONLY)
Abs Immature Granulocytes: 0.01 10*3/uL (ref 0.00–0.07)
Basophils Absolute: 0.1 10*3/uL (ref 0.0–0.1)
Basophils Relative: 1 %
Eosinophils Absolute: 0.4 10*3/uL (ref 0.0–0.5)
Eosinophils Relative: 6 %
HCT: 37.5 % (ref 36.0–46.0)
Hemoglobin: 12.1 g/dL (ref 12.0–15.0)
Immature Granulocytes: 0 %
Lymphocytes Relative: 19 %
Lymphs Abs: 1.1 10*3/uL (ref 0.7–4.0)
MCH: 32.8 pg (ref 26.0–34.0)
MCHC: 32.3 g/dL (ref 30.0–36.0)
MCV: 101.6 fL — ABNORMAL HIGH (ref 80.0–100.0)
Monocytes Absolute: 0.6 10*3/uL (ref 0.1–1.0)
Monocytes Relative: 10 %
Neutro Abs: 3.7 10*3/uL (ref 1.7–7.7)
Neutrophils Relative %: 64 %
Platelet Count: 200 10*3/uL (ref 150–400)
RBC: 3.69 MIL/uL — ABNORMAL LOW (ref 3.87–5.11)
RDW: 13.7 % (ref 11.5–15.5)
WBC Count: 5.8 10*3/uL (ref 4.0–10.5)
nRBC: 0 % (ref 0.0–0.2)

## 2022-08-10 MED ORDER — ANASTROZOLE 1 MG PO TABS: 1.0000 mg | ORAL_TABLET | Freq: Every day | ORAL | 4 refills | Status: DC

## 2022-08-10 NOTE — Progress Notes (Signed)
[facility] NOTE  Patient Care Team: Leone Haven, MD as PCP - General (Family Medicine) Kate Sable, MD as PCP - Cardiology (Cardiology) Bary Castilla Forest Gleason, MD as Consulting Physician (General Surgery) Dallas Schimke, MD (Internal Medicine) Daiva Huge, RN as Oncology Nurse Navigator  CHIEF COMPLAINTS/PURPOSE OF CONSULTATION: Breast cancer  #  Oncology History Overview Note  # 2009- Sigmoid colon cancer stage II (T3 N0 4 lymph nodes sampled M0 stage II high risk there was obstruction and perforation abscess; Dr.Hern), workup included a low CEA preoperatively and a chest x-ray and CT scan abdomen pelvis negative for metastatic disease, S/P colostomy; s/p FOLFOX x 6 months. [Dr.Gittin ]  # LATER REVERSED , K RAS  wild type B RAF not evaluated   Also initial iron deficiency with anemia thrombocytosis, high platelets considered reactive, workup includes a normal PCR for CML and a negative [id number] mutation  DIAGNOSIS:  A. BREAST, RIGHT; LUMPECTOMY:  - INVASIVE MAMMARY CARCINOMA.  - DUCTAL CARCINOMA IN SITU (DCIS).  - SEE CANCER SUMMARY BELOW.  - BIOPSY SITE CHANGE WITH CLIP (2).  - FIBROUS TISSUE WITH CYST FORMATION AND FLORID DUCTAL HYPERPLASIA.  - SKIN AND NIPPLE NEGATIVE FOR MALIGNANCY.  - VASCULAR CALCIFICATION.   B. SENTINEL LYMPH NODES 1 AND 2, RIGHT AXILLA; EXCISION:  - METASTATIC CARCINOMA INVOLVES ONE OF TWO LYMPH NODES (1/2).   C. NON-SENTINEL LYMPH NODES, RIGHT AXILLA; EXCISION:  - ONE LYMPH NODE NEGATIVE FOR MALIGNANCY (0/1).   CANCER CASE SUMMARY: INVASIVE CARCINOMA OF THE BREAST  Standard(s): AJCC-UICC 8   SPECIMEN  Procedure: Lumpectomy  Specimen Laterality: Right   TUMOR  Histologic Type: Invasive carcinoma of no special type (ductal)  Histologic Grade (Nottingham Histologic Score)       Glandular (Acinar)/Tubular Differentiation: 3       Nuclear Pleomorphism: 3       Mitotic Rate: 3       Overall Grade: 3  Tumor Size:  24 mm  Tumor Focality: Single focus of invasive carcinoma  Ductal Carcinoma In Situ (DCIS): Present, nuclear grade 2-3  Tumor Extent: Not applicable  Lymphatic and/or Vascular Invasion: Present  Treatment Effect in the Breast: No known presurgical therapy   MARGINS  Margin Status for Invasive Carcinoma: All margins negative for invasive  carcinoma       Distance from closest margin: 10 mm       Specify closest margin: Superior   Margin Status for DCIS: All margins negative for DCIS       Distance from DCIS to closest margin: 10 mm       Specify closest margin: Superior   REGIONAL LYMPH NODES  Regional Lymph Node Status: Tumor present in regional lymph node(s)       Number of Lymph Nodes with Macrometastases (greater than 2 mm): 1       Number of Lymph Nodes with Micrometastases (greater than 0.2 mm to  2 mm and/or greater than 200 cells): 0       Number of Lymph Nodes with Isolated Tumor Cells (0.2 mm or less OR  200 cells or less): 0       Size of Largest Metastatic Deposit: 3 mm      Extranodal Extension: Not identified       Total Number of Lymph Nodes Examined (sentinel and non-sentinel): 3       Number of Sentinel Nodes Examined: 2   DISTANT METASTASIS  Distant Site(s) Involved, if applicable: Not applicable  PATHOLOGIC STAGE CLASSIFICATION (pTNM, AJCC 8th Edition):  Modified Classification: Not applicable  pT Category: pT2  T Suffix: Not applicable  pN Category: pN1a  N Suffix: (sn)  pM Category: Not applicable   SPECIAL STUDIES  Breast Biomarker Testing Performed on Previous Outside Biopsy:  23-250-T11  Per outside report:  Estrogen Receptor (ER) Status: POSITIVE          Percentage of cells with nuclear positivity: Greater than 90%          Average intensity of staining: Strong   Progesterone Receptor (PgR) Status: POSITIVE          Percentage of cells with nuclear positivity: 60%          Average intensity of staining: Strong   HER2 (by  immunohistochemistry): EQUIVOCAL (Score 2+)  HER2 FISH: NEGATIVE   Ki-67: Not performed   # Cytoxan Taxotere x 4 cycles; finished JAN, 2024. [Neutropenic fevers s/p cycle #4;  -FEB 2nd, 2024-right lung PE; left lower LE  DVT- on eliquis.   # FEB 19th-start RT  IMPRESSION: 1. Complex cystic and solid-appearing mass within the subareolar RIGHT breast, overall measuring 3.1 cm greatest dimension, the solid enhancing component at the anterior-lateral aspect of the mass measuring 2 x 1 cm, abutting the overlying skin but without evidence of involvement/enhancement of the skin. Susceptibility artifact within the mass may represent a biopsy clip (if biopsy has been performed at an outside site since the diagnostic mammogram/ultrasound of 12/28/2021) or this artifact could represent calcifications within the mass. This mass corresponds to the recent ultrasound finding of a complex irregular mass at the 11 o'clock axis of the retroareolar RIGHT breast. 2. No evidence of multifocal or multicentric disease within the RIGHT breast. 3. No evidence of contralateral disease in the LEFT breast.   RECOMMENDATION: If has not already been performed, recommend ultrasound-guided biopsy of the suspicious mass in the subareolar RIGHT breast, with preferential targeting of the anterior-lateral component of the mass which is shown to be the enhancing component on today's MRI.   BI-RADS CATEGORY  4: Suspicious.     Cancer of sigmoid (Resolved)  Carcinoma of overlapping sites of right breast in female, estrogen receptor positive  02/14/2022 Initial Diagnosis   Carcinoma of overlapping sites of right breast in female, estrogen receptor positive (HCC)   02/14/2022 Cancer Staging   Staging form: Breast, AJCC 8th Edition - Pathologic: Stage IIA (pT2, pN1, cM0, G3, ER+, PR+, HER2-) - Signed by [name], MD on 02/14/2022 Histologic grading system: 3 grade system   02/23/2022 -  Chemotherapy    Patient is on Treatment Plan : BREAST TC q21d        HISTORY OF PRESENTING ILLNESS:   Alone.  Ambulating independently.   Sabrina Wilson 82 y.o.  female patient with ER/PR positive Her 2 Negative  breast cancer stage II -currently s/p adjuvant chemotherapy; currently s/p adjuvant radiation; DVT PE on Eliquis is here for follow-up.  Patient finished radiation approximately 2 weeks ago.  Noted to have radiation dermatitis currently resolving.  Complains of mild to moderate swelling/limited range of motion on the right upper extremity.  Her leg swelling is improved overall. Denies any worsening shortness of breath or cough.  Back pain improved on Tylenol.  Denies any blood in the black or stools.  Review of Systems  Constitutional:  Negative for chills, diaphoresis, fever, malaise/fatigue and weight loss.  HENT:  Negative for nosebleeds and sore throat.   Eyes:  Negative for double  vision.  Respiratory:  Negative for cough, hemoptysis, sputum production, shortness of breath and wheezing.   Cardiovascular:  Negative for chest pain, palpitations, orthopnea and leg swelling.  Gastrointestinal:  Negative for abdominal pain, blood in stool, constipation, diarrhea, heartburn, melena, nausea and vomiting.  Genitourinary:  Negative for dysuria, frequency and urgency.  Musculoskeletal:  Negative for back pain and joint pain.  Skin: Negative.  Negative for itching and rash.  Neurological:  Negative for dizziness, tingling, focal weakness, weakness and headaches.  Endo/Heme/Allergies:  Does not bruise/bleed easily.  Psychiatric/Behavioral:  Negative for depression. The patient is not nervous/anxious and does not have insomnia.      MEDICAL HISTORY:  Past Medical History:  Diagnosis Date   Actinic keratosis 02/12/2020   R lat calf (hypertrophic)   Arthritis    Cancer of sigmoid 06/17/2016   Colon cancer 2009   T3,N0; s/p resection  Dr. Earnestine Leys and chemotherapy by Dr. Neale Burly   Diastolic  dysfunction    a. 08/2020 Echo: EF 60-65%, no rwma, Gr1 DD, nl RV size/fxn. Mild MR.   Hernia of flank    History of actinic keratoses 08/11/2020   Bx proven at left lateral ankle proximal, LN2 10/08/20   History of stress test    a. 09/2020 MV: EF >65%. No ischemia/infarct. Mild Ao Ca2+ and minimal Cor Ca2+.   Hypertension    Neuropathy    Polyp at cervical os    s/p resection Dr. Hyacinth Meeker   Skin cancer 01/22/2020   surface of an atypical verrucous squamous proliferation    Squamous cell carcinoma of skin 07/16/2020   Left lateral ankle anterior, [facility] with [facility] 08-11-2020    SURGICAL HISTORY: Past Surgical History:  Procedure Laterality Date   BREAST BIOPSY Right 05/2014   Dr. Rutherford Nail office-benign   BREAST LUMPECTOMY WITH SENTINEL LYMPH NODE BIOPSY Right 01/21/2022   Procedure: BREAST LUMPECTOMY WITH SENTINEL LYMPH NODE BX;  Surgeon: [name], MD;  Location: [facility] ORS;  Service: General;  Laterality: Right;   BREAST SURGERY Right February 2016   Vacuum assisted biopsy for recurrent cyst, fibrocystic changes without evidence of malignancy.   CATARACT EXTRACTION W/PHACO Left 01/13/2015   Procedure: CATARACT EXTRACTION PHACO AND INTRAOCULAR LENS PLACEMENT (IOC);  Surgeon: Galen Manila, MD;  Location: [facility] ORS;  Service: Ophthalmology;  Laterality: Left;  [place]  1:02.9 AP   19.2 CDE  12.06 casette lot #  [id number]   CATARACT EXTRACTION W/PHACO Right 02/03/2015   Procedure: CATARACT EXTRACTION PHACO AND INTRAOCULAR LENS PLACEMENT (IOC);  Surgeon: Galen Manila, MD;  Location: [facility] ORS;  Service: Ophthalmology;  Laterality: Right;  [id number]:53 ap46.5 [id number]   COLECTOMY     COLONOSCOPY  2015   Dr Bluford Kaufmann   COLONOSCOPY WITH PROPOFOL N/A 09/13/2017   Procedure: COLONOSCOPY WITH PROPOFOL;  Surgeon: [name], MD;  Location: [facility] ENDOSCOPY;  Service: Endoscopy;  Laterality: N/A;   COLONOSCOPY WITH PROPOFOL N/A 11/08/2017   Procedure: COLONOSCOPY WITH PROPOFOL;  Surgeon:  [name], MD;  Location: [facility] ENDOSCOPY;  Service: Endoscopy;  Laterality: N/A;   colostomy reversal     DILATION AND CURETTAGE OF UTERUS  2014   Dr. Hyacinth Meeker, benign per pt   EYE SURGERY     HERNIA REPAIR  07/13/12   ventral    SKIN CANCER EXCISION     TONSILLECTOMY      SOCIAL HISTORY: Social History   Socioeconomic History   Marital status: Married    Spouse name: Not on file  Number of children: Not on file   Years of education: Not on file   Highest education level: Not on file  Occupational History   Not on file  Tobacco Use   Smoking status: Former    Types: Cigarettes    Quit date: 10/12/2007    Years since quitting: 14.8   Smokeless tobacco: Never  Vaping Use   Vaping Use: Never used  Substance and Sexual Activity   Alcohol use: Yes    Alcohol/week: 1.0 standard drink of alcohol    Types: 1 Standard drinks or equivalent per week    Comment: with dinner GLASS OF WINE EACH DAY   Drug use: No   Sexual activity: Not on file  Other Topics Concern   Not on file  Social History Narrative   Lives in [place] with husband. 2 children, son lives nearby.Diet - regularExercise - none. 30 mins/in [place]. Quit smoking in 2009. Wine before dinner. Pianist in church; real estate; Diplomatic Services operational officer.    Social Determinants of Health   Financial Resource Strain: Low Risk  (05/19/2022)   Overall Financial Resource Strain (CARDIA)    Difficulty of Paying Living Expenses: Not very hard  Food Insecurity: No Food Insecurity (06/03/2022)   Hunger Vital Sign    Worried About Running Out of Food in the Last Year: Never true    Ran Out of Food in the Last Year: Never true  Transportation Needs: No Transportation Needs (05/28/2022)   PRAPARE - Administrator, Civil Service (Medical): No    Lack of Transportation (Non-Medical): No  Physical Activity: Not on file  Stress: No Stress Concern Present (11/16/2020)   [employer] of Occupational Health - Occupational  Stress Questionnaire    Feeling of Stress : Not at all  Social Connections: Not on file  Intimate Partner Violence: Not At Risk (05/28/2022)   Humiliation, Afraid, Rape, and Kick questionnaire    Fear of Current or Ex-Partner: No    Emotionally Abused: No    Physically Abused: No    Sexually Abused: No    FAMILY HISTORY: Family History  Problem Relation Age of Onset   Stroke Mother    Diabetes Mother    Cancer Father        colon   Stroke Sister    Cancer Brother        colon   Cancer Brother        brain    ALLERGIES:  is allergic to sulfa antibiotics.  MEDICATIONS:  Current Outpatient Medications  Medication Sig Dispense Refill   albuterol (VENTOLIN HFA) 108 (90 Base) MCG/ACT inhaler Inhale 2 puffs into the lungs every 6 (six) hours as needed for wheezing or shortness of breath. 8 g 2   anastrozole (ARIMIDEX) 1 MG tablet Take 1 tablet (1 mg total) by mouth daily. 30 tablet 4   atorvastatin (LIPITOR) 10 MG tablet Take 1 tablet (10 mg total) by mouth daily. 90 tablet 1   Calcium Carbonate-Vit D-Min (CALCIUM 1200 PO) Take 1 tablet by mouth daily.     niacinamide 500 MG tablet Take 500 mg by mouth 2 (two) times daily.     Omega-3 Fatty Acids (FISH OIL PO) Take 1 capsule by mouth daily.     silver sulfADIAZINE (SILVADENE) 1 % cream Apply 1 Application topically 2 (two) times daily. 50 g 1   umeclidinium bromide (INCRUSE ELLIPTA) 62.5 MCG/ACT AEPB Inhale 1 puff into the lungs daily. 30 each 0   VITAMIN D,  CHOLECALCIFEROL, PO Take 2,000 Units by mouth daily.     apixaban (ELIQUIS) 5 MG TABS tablet Two tabs po twice a day for five days then 1 tab po twice a day afterwards (Patient not taking: Reported on 08/10/2022) 60 tablet 4   guaiFENesin (MUCINEX) 600 MG 12 hr tablet Take 1 tablet (600 mg total) by mouth 2 (two) times daily as needed for cough or to loosen phlegm. (Patient not taking: Reported on 06/07/2022) 10 tablet 0   HYDROcodone-acetaminophen (NORCO/VICODIN) 5-325 MG tablet  Take 1 tablet by mouth every 6 (six) hours as needed for severe pain. (Patient not taking: Reported on 06/29/2022) 10 tablet 0   No current facility-administered medications for this visit.      Marland Kitchen  PHYSICAL EXAMINATION:   Vitals:   08/10/22 1431 08/10/22 1436  BP: (!) 164/81 (!) 148/84  Pulse: 76 76  Resp: 17   Temp: (!) 97 F (36.1 C)   SpO2: 100%    Filed Weights   08/10/22 1431  Weight: 179 lb (81.2 kg)    Physical Exam Vitals and nursing note reviewed.  HENT:     Head: Normocephalic and atraumatic.     Mouth/Throat:     Pharynx: Oropharynx is clear.  Eyes:     Extraocular Movements: Extraocular movements intact.     Pupils: Pupils are equal, round, and reactive to light.  Cardiovascular:     Rate and Rhythm: Normal rate and regular rhythm.  Pulmonary:     Comments: Decreased breath sounds bilaterally.  Abdominal:     Palpations: Abdomen is soft.  Musculoskeletal:        General: Normal range of motion.     Cervical back: Normal range of motion.  Skin:    General: Skin is warm.  Neurological:     General: No focal deficit present.     Mental Status: She is alert and oriented to person, place, and time.  Psychiatric:        Behavior: Behavior normal.        Judgment: Judgment normal.      LABORATORY DATA:  I have reviewed the data as listed Lab Results  Component Value Date   WBC 5.8 08/10/2022   HGB 12.1 08/10/2022   HCT 37.5 08/10/2022   MCV 101.6 (H) 08/10/2022   PLT 200 08/10/2022   Recent Labs    05/29/22 0929 05/30/22 0606 05/31/22 0410 06/03/22 1116 06/08/22 1031 08/10/22 1416  NA 128*   < > 129* 135 135 136  K 3.8   < > 4.0 4.2 4.4 3.6  CL 95*   < > 96* 98 100 102  CO2 25   < > GLUCOSE 108*   < > 225* 150* 113* 136*  BUN 14   < > 16 26* 15 19  CREATININE 0.69   < > 0.75 0.85 0.76 0.88  CALCIUM 9.0   < > 9.3 9.9 9.7 9.8  GFRNONAA >60   < > >60  --  >60 >60  PROT 5.7*  --   --   --  6.2* 6.7  ALBUMIN 3.0*  --   --    --  3.3* 4.1  AST 12*  --   --   --  18 24  ALT 14  --   --   --  26 15  ALKPHOS 47  --   --   --  57 64  BILITOT 1.1  --   --   --  0.6 0.5   < > = values in this interval not displayed.    RADIOGRAPHIC STUDIES: I have personally reviewed the radiological images as listed and agreed with the findings in the report. DG Bone Density  Result Date: 07/14/2022 EXAM: DUAL X-RAY ABSORPTIOMETRY (DXA) FOR BONE MINERAL DENSITY IMPRESSION: Your patient Sabrina Wilson completed a BMD test on 07/14/2022 using the [employer] iDXA DXA System (software version: 14.10) manufactured by [employer]. The following summarizes the results of our evaluation. Technologist: SCE PATIENT BIOGRAPHICAL: Name: Sabrina, Wilson Patient ID: [id number] Birth Date: [birth year] Height: 66.0 in. Gender: Female Exam Date: 07/14/2022 Weight: 178.9 lbs. Indications: Advanced Age, Caucasian, Height Loss, History of Chemo, History of colon cancer, History of Fracture (Adult), Osteoarthritis, Postmenopausal, Previous Chemo and Radiation Fractures: Right foot Treatments: calcium w/ vit D, Vitamin D DENSITOMETRY RESULTS: Site      Region        Measured Date Measured Age WHO Classification Young Adult T-score BMD         %Change vs. Previous Significant Change (*) AP Spine L1-L4 (L2,L3) 07/14/2024 79.4 Normal 1.6 1.376 g/cm2 5.6% Yes AP Spine L1-L4 (L2,L3) 12/16/2019 76.8 Normal 1.0 1.303 g/cm2 2.4% - AP Spine L1-L4 (L2,L3) 12/01/2014 71.8 Normal 0.7 1.273 g/cm2 1.7% - AP Spine L1-L4 (L2,L3) 6/28/2009 66.2 Normal 0.6 1.252 g/cm2 - - DualFemur Neck Right 07/14/2022 79.4 Osteopenia -1.1 0.887 g/cm2 0.8% - DualFemur Neck Right 12/16/2019 76.8 Osteopenia -1.1 0.880 g/cm2 -3.3% - DualFemur Neck Right 12/01/2014 71.8 Normal -0.9 0.910 g/cm2 1.6% - DualFemur Neck Right 04/28/2009 66.2 Normal -1.0 0.896 g/cm2 - - DualFemur Total Mean 07/14/2022 79.4 Normal -0.4 0.955 g/cm2 2.0% Yes DualFemur Total Mean 12/16/2019 76.8 Normal -0.6 0.936 g/cm2 -3.7%  Yes DualFemur Total Mean 12/01/2014 71.8 Normal -0.3 0.972 g/cm2 3.0% Yes DualFemur Total Mean 04/28/2009 66.2 Normal -0.5 0.944 g/cm2 - - ASSESSMENT: The BMD measured at Femur Neck Right is 0.887 g/cm2 with a T-score of -1.1. This patient is considered osteopenic according to World Health Organization [facility]) criteria. The scan quality is good. L2/L3 were excluded due to degenerative changes. Compared with prior study, there has been a significant increase in the spine. Compared with prior study, there has been a significant increase in the total hip. World Science writer [facility]) criteria for post-menopausal, Caucasian Women: Normal:                   T-score at or above -1 SD Osteopenia/low bone mass: T-score between -1 and -2.5 SD Osteoporosis:             T-score at or below -2.5 SD RECOMMENDATIONS: 1. All patients should optimize calcium and vitamin D intake. 2. Consider FDA-approved medical therapies in postmenopausal women and men aged 45 years and older, based on the following: a. A hip or vertebral(clinical or morphometric) fracture b. T-score < -2.5 at the femoral neck or spine after appropriate evaluation to exclude secondary causes c. Low bone mass (T-score between -1.0 and -2.5 at the femoral neck or spine) and a 10-year probability of a hip fracture > 3% or a 10-year probability of a major osteoporosis-related fracture > 20% based on the [place]-adapted WHO algorithm 3. Clinician judgment and/or patient preferences may indicate treatment for people with 10-year fracture probabilities above or below these levels FOLLOW-UP: People with diagnosed cases of osteoporosis or at high risk for fracture should have regular bone mineral density tests. For patients eligible for Medicare, routine testing is allowed once every 2 years. The  testing frequency can be increased to one year for patients who have rapidly progressing disease, those who are receiving or discontinuing medical therapy to restore bone mass, or  have additional risk factors. I have reviewed this report, and agree with the above findings. [facility] Dear Dr Donneta Romberg, Your patient MAYSA ESKILDSEN completed a FRAX assessment on 07/14/2022 using the [facility] iDXA DXA System (analysis version: 14.10) manufactured by [employer]. The following summarizes the results of our evaluation. PATIENT BIOGRAPHICAL: Name: Sabrina, Wilson Patient ID: [id number] Birth Date: [birth year] Height:    66.0 in. Gender:     [age]    [age]       Weight:    178.9 lbs. Ethnicity:  White                            Exam Date: 07/14/2022 FRAX* RESULTS:  (version: 3.5) 10-year Probability of Fracture1 Major Osteoporotic Fracture2 Hip Fracture 17.0% 3.0% Population: [place] (Caucasian) Risk Factors: History of Fracture (Adult) Based on Femur (Right) Neck BMD 1 -The 10-year probability of fracture may be lower than reported if the patient has received treatment. 2 -Major Osteoporotic Fracture: Clinical Spine, Forearm, Hip or Shoulder *FRAX is a Armed forces logistics/support/administrative officer of the [employer] of [employer] for Metabolic Bone Disease, a World Science writer (WHO) [employer]. ASSESSMENT: The probability of a major osteoporotic fracture is 17.0% within the next ten years. The probability of a hip fracture is 3.0% within the next ten years. . Electronically Signed   By: Gerome Sam III M.D.   On: 07/14/2022 13:01    ASSESSMENT & PLAN:   Carcinoma of overlapping sites of right breast in female, estrogen receptor positive (HCC) # Right breast cancer -[facility]; ER/PR positive Her 2 Negative ;  G-3; LVI + T2N1.  Stage II [OCT 2023; Dr.Byrnett] s/p Lumpectomy; node positive-Oncotype RS- 35- s/p  adjuvant chemotherapy with Taxotere-Cytoxan every 3 weeks x4 cycles. Consider abema x2 years/zometa.   S/p postlumpectomy radiation April 3rd 2024.    #Discussed the mechanism of action of aromatase inhibitors-with blocking of estrogen to prevent breast cancer.   Also discussed the potential side effects including but not limited to arthralgias hot flashes and increased risk of osteoporosis. Will start on anastrazole today.   # [2nd,FEB 2024] CTA-  acute pulmonary embolism, with new segmental to subsegmental embolus present in the right lower lobe/right lower lobe-pulmonary infarct. LEFT LE- Acute DVT-on anticoagulation with eliquis on 5 mg BID X6 months- [provoked sec to chemo].   # Bone health-2023 osteopenic T-score of -1.1.Continue calcium plus vitamin D.- stable.  I discussed the role of adjuvant Zometa every 6 months x 3 years. Discussed the potential side effects including but not limited to-infusion reaction; hypocalcemia; and Osteo-necrosis of jaw.  Recommend dental evaluation.  Recommend ca+Vit D BID.   # Genetic testing: brother-& father- colon cancer; other brother- brain tumor; no breast cancers in family. pending genetics.   # Right arm decreased range of motion/swelling- ?  Lymphedema-referred to [facility]/OT.  # IV Access: PIV   # DISPOSITION: # referral to [facility] re: breast cancer- ?lymphedema  # follow in 6 weeks- MD; no labs- Dr.B  All questions were answered. The patient/family knows to call the clinic with any problems, questions or concerns.    [name], MD 08/10/2022 3:49 PM

## 2022-08-10 NOTE — Progress Notes (Signed)
No new concerns today 

## 2022-08-10 NOTE — Patient Instructions (Signed)
#   recommend dental clearance re: zometa.

## 2022-08-10 NOTE — Assessment & Plan Note (Addendum)
#   Right breast cancer -[facility]; ER/PR positive Her 2 Negative ;  G-3; LVI + T2N1.  Stage II [OCT 2023; Dr.Byrnett] s/p Lumpectomy; node positive-Oncotype RS- 35- s/p  adjuvant chemotherapy with Taxotere-Cytoxan every 3 weeks x4 cycles. Consider abema x2 years/zometa.   S/p postlumpectomy radiation April 3rd 2024.    #Discussed the mechanism of action of aromatase inhibitors-with blocking of estrogen to prevent breast cancer.  Also discussed the potential side effects including but not limited to arthralgias hot flashes and increased risk of osteoporosis. Will start on anastrazole today.   # [2nd,FEB 2024] CTA-  acute pulmonary embolism, with new segmental to subsegmental embolus present in the right lower lobe/right lower lobe-pulmonary infarct. LEFT LE- Acute DVT-on anticoagulation with eliquis on 5 mg BID X6 months- [provoked sec to chemo].   # Bone health-2023 osteopenic T-score of -1.1.Continue calcium plus vitamin D.- stable.  I discussed the role of adjuvant Zometa every 6 months x 3 years. Discussed the potential side effects including but not limited to-infusion reaction; hypocalcemia; and Osteo-necrosis of jaw.  Recommend dental evaluation.  Recommend ca+Vit D BID.   # Genetic testing: brother-& father- colon cancer; other brother- brain tumor; no breast cancers in family. pending genetics.   # Right arm decreased range of motion/swelling- ?  Lymphedema-referred to [facility]/OT.  # IV Access: PIV   # DISPOSITION: # referral to [facility] re: breast cancer- ?lymphedema  # follow in 6 weeks- MD; no labs- Dr.B

## 2022-08-11 ENCOUNTER — Encounter: Payer: Self-pay | Admitting: *Deleted

## 2022-08-12 ENCOUNTER — Other Ambulatory Visit: Payer: Self-pay

## 2022-08-17 ENCOUNTER — Inpatient Hospital Stay: Payer: Medicare Other | Admitting: Occupational Therapy

## 2022-08-24 ENCOUNTER — Inpatient Hospital Stay: Payer: Medicare Other | Attending: Internal Medicine | Admitting: Occupational Therapy

## 2022-08-24 DIAGNOSIS — C50811 Malignant neoplasm of overlapping sites of right female breast: Secondary | ICD-10-CM | POA: Insufficient documentation

## 2022-08-24 DIAGNOSIS — M8580 Other specified disorders of bone density and structure, unspecified site: Secondary | ICD-10-CM | POA: Insufficient documentation

## 2022-08-24 DIAGNOSIS — L905 Scar conditions and fibrosis of skin: Secondary | ICD-10-CM

## 2022-08-24 DIAGNOSIS — Z79811 Long term (current) use of aromatase inhibitors: Secondary | ICD-10-CM | POA: Insufficient documentation

## 2022-08-24 DIAGNOSIS — Z87891 Personal history of nicotine dependence: Secondary | ICD-10-CM | POA: Insufficient documentation

## 2022-08-24 DIAGNOSIS — Z170 Estrogen receptor positive status [ER+]: Secondary | ICD-10-CM | POA: Insufficient documentation

## 2022-08-24 DIAGNOSIS — Z79899 Other long term (current) drug therapy: Secondary | ICD-10-CM | POA: Insufficient documentation

## 2022-08-24 NOTE — Therapy (Signed)
[facility] at [facility] [address], [address] [place], [place], [address] Phone: [phone]   Fax:  [phone]  Occupational Therapy Screen  Patient Details  Name: Sabrina Wilson MRN: [id number] Date of Birth: 1940-10-15 No data recorded  Encounter Date: 08/24/2022   OT End of Session - 08/24/22 1751     Visit Number 0             Past Medical History:  Diagnosis Date   Actinic keratosis 02/12/2020   R lat calf (hypertrophic)   Arthritis    Cancer of sigmoid (HCC) 06/17/2016   Colon cancer (HCC) 2009   T3,N0; s/p resection  Dr. Earnestine Leys and chemotherapy by Dr. Neale Burly   Diastolic dysfunction    a. 08/2020 Echo: EF 60-65%, no rwma, Gr1 DD, nl RV size/fxn. Mild MR.   Hernia of flank    History of actinic keratoses 08/11/2020   Bx proven at left lateral ankle proximal, LN2 10/08/20   History of stress test    a. 09/2020 MV: EF >65%. No ischemia/infarct. Mild Ao Ca2+ and minimal Cor Ca2+.   Hypertension    Neuropathy    Polyp at cervical os    s/p resection Dr. Hyacinth Meeker   Skin cancer 01/22/2020   surface of an atypical verrucous squamous proliferation    Squamous cell carcinoma of skin 07/16/2020   Left lateral ankle anterior, treated with [facility] 08-11-2020    Past Surgical History:  Procedure Laterality Date   BREAST BIOPSY Right 05/2014   Dr. Rutherford Nail office-benign   BREAST LUMPECTOMY WITH SENTINEL LYMPH NODE BIOPSY Right 01/21/2022   Procedure: BREAST LUMPECTOMY WITH SENTINEL LYMPH NODE BX;  Surgeon: [name], MD;  Location: [facility] ORS;  Service: General;  Laterality: Right;   BREAST SURGERY Right February 2016   Vacuum assisted biopsy for recurrent cyst, fibrocystic changes without evidence of malignancy.   CATARACT EXTRACTION W/PHACO Left 01/13/2015   Procedure: CATARACT EXTRACTION PHACO AND INTRAOCULAR LENS PLACEMENT (IOC);  Surgeon: Galen Manila, MD;  Location: [facility] ORS;  Service: Ophthalmology;  Laterality: Left;   [place]  1:02.9 AP   19.2 CDE  12.06 casette lot #  [id number]   CATARACT EXTRACTION W/PHACO Right 02/03/2015   Procedure: CATARACT EXTRACTION PHACO AND INTRAOCULAR LENS PLACEMENT (IOC);  Surgeon: Galen Manila, MD;  Location: [facility] ORS;  Service: Ophthalmology;  Laterality: Right;  us00:53 ap46.5 cde10.79   COLECTOMY     COLONOSCOPY  2015   Dr Bluford Kaufmann   COLONOSCOPY WITH PROPOFOL N/A 09/13/2017   Procedure: COLONOSCOPY WITH PROPOFOL;  Surgeon: [name], MD;  Location: [facility] ENDOSCOPY;  Service: Endoscopy;  Laterality: N/A;   COLONOSCOPY WITH PROPOFOL N/A 11/08/2017   Procedure: COLONOSCOPY WITH PROPOFOL;  Surgeon: [name], MD;  Location: [facility] ENDOSCOPY;  Service: Endoscopy;  Laterality: N/A;   colostomy reversal     DILATION AND CURETTAGE OF UTERUS  2014   Dr. Hyacinth Meeker, benign per pt   EYE SURGERY     HERNIA REPAIR  07/13/12   ventral    SKIN CANCER EXCISION     TONSILLECTOMY      There were no vitals filed for this visit.   Subjective Assessment - 08/24/22 1749     Subjective  I seen DR B the other day and reported I feel like a little pocket of swelling under my right armpit.  Otherwise surgery did okay.  I am recovering from radiation.  I did blister some under my breast and  my armpit.  I just started wearing my regular bra.    Currently in Pain? No/denies                 LYMPHEDEMA/ONCOLOGY QUESTIONNAIRE - 08/24/22 0001       Right Upper Extremity Lymphedema   10 cm Proximal to Olecranon Process 29 cm    Olecranon Process 24 cm    15 cm Proximal to Ulnar Styloid Process 22.4 cm      Left Upper Extremity Lymphedema   10 cm Proximal to Olecranon Process 29.5 cm    Olecranon Process 24 cm    15 cm Proximal to Ulnar Styloid Process 22.4 cm            08/10/22 Dr Donneta Romberg: ASSESSMENT & PLAN:    Carcinoma of overlapping sites of right breast in female, estrogen receptor positive (HCC) # Right breast cancer -[facility]; ER/PR positive Her 2 Negative ;  G-3;  LVI + T2N1.  Stage II [OCT 2023; Dr.Byrnett] s/p Lumpectomy; node positive-Oncotype RS- 35- s/p  adjuvant chemotherapy with Taxotere-Cytoxan every 3 weeks x4 cycles. Consider abema x2 years/zometa.   S/p postlumpectomy radiation April 3rd 2024.     #Discussed the mechanism of action of aromatase inhibitors-with blocking of estrogen to prevent breast cancer.  Also discussed the potential side effects including but not limited to arthralgias hot flashes and increased risk of osteoporosis. Will start on anastrazole today.    # [2nd,FEB 2024] CTA-  acute pulmonary embolism, with new segmental to subsegmental embolus present in the right lower lobe/right lower lobe-pulmonary infarct. LEFT LE- Acute DVT-on anticoagulation with eliquis on 5 mg BID X6 months- [provoked sec to chemo].    # Bone health-2023 osteopenic T-score of -1.1.Continue calcium plus vitamin D.- stable.  I discussed the role of adjuvant Zometa every 6 months x 3 years. Discussed the potential side effects including but not limited to-infusion reaction; hypocalcemia; and Osteo-necrosis of jaw.  Recommend dental evaluation.  Recommend ca+Vit D BID.    # Genetic testing: brother-& father- colon cancer; other brother- brain tumor; no breast cancers in family. pending genetics.    # Right arm decreased range of motion/swelling- ?  Lymphedema-referred to Iris Hairston/OT.   # IV Access: PIV    # DISPOSITION: # referral to Marisue Humble re: breast cancer- ?lymphedema  # follow in 6 weeks- MD; no labs- Dr.B   08/24/22 OT SCREEN: Patient referred to OT post right lumpectomy with 1 sentinel lymph node removed on 01/21/2022 incision across nipple about 5 cm in length.  Did had radiation.  Radiation done 07/27/22 Patient report feels a small pocket of swelling in the right armpit at times. Upon assessment feels like about the same than the left side-lumpectomy scar let if feel more pronounce No fibrosis Patient to use show some scar tissue and fibrosis along  scar in the middle of breast. Patient was educated on soft tissue massage to do about 3 minutes 2 times a day.  With arm overhead. Patient tolerated very well. Patient risk for lymphedema very low. Circumference of bilateral upper extremity as well as active range of motion in bilateral shoulders within normal limits. Patient to follow-up as needed if feel fibrosis not decreasing upon which OT can fabricate chip bag to wear in bra.                               Visit Diagnosis: Scar condition and fibrosis of skin  Problem List Patient Active Problem List   Diagnosis Date Noted   Asthmatic bronchitis with exacerbation 05/30/2022   DVT (deep venous thrombosis) (HCC) 05/29/2022   Hypomagnesemia 05/29/2022   Hyponatremia 05/29/2022   Pulmonary embolism (HCC) 05/27/2022   Bilateral pneumonia 05/13/2022   Acute hypoxic respiratory failure (HCC) 05/12/2022   Demand ischemia 05/12/2022   Carcinoma of overlapping sites of right breast in female, estrogen receptor positive (HCC) 02/14/2022   Invasive lobular carcinoma of breast in female (HCC) 01/12/2022   Breast lesion 12/22/2021   Squamous cell carcinoma of skin 11/17/2020   Chronic bilateral low back pain without sciatica 12/14/2018   Varicose veins of bilateral lower extremities with other complications 01/31/2018   Hyperlipidemia 05/02/2016   Prediabetes 05/02/2016   Multiple nevi 04/06/2016   Allergic rhinitis 04/06/2016   Neuropathy 04/06/2016   Lipoma of neck 09/27/2013   Personal history of colon cancer 11/11/2011   Hypertension 12/18/2010    Oletta Cohn, OTR/L,CLT 08/24/2022, 5:53 PM  [facility] at [facility] [address] [place], [place], [address] Phone: [phone]   Fax:  [phone]  Name: Darlin Stenseth MRN: [id number] Date of Birth: [birth year]

## 2022-08-29 ENCOUNTER — Ambulatory Visit
Admission: RE | Admit: 2022-08-29 | Discharge: 2022-08-29 | Disposition: A | Discharge status: Home / Self Care | Payer: Medicare Other | Source: Ambulatory Visit | Attending: Radiation Oncology | Admitting: Radiation Oncology

## 2022-08-29 ENCOUNTER — Encounter: Payer: Self-pay | Admitting: Radiation Oncology

## 2022-08-29 VITALS — BP 158/79 | HR 72 | Temp 98.5°F | Resp 16 | Ht 66.5 in | Wt 178.0 lb

## 2022-08-29 DIAGNOSIS — Z170 Estrogen receptor positive status [ER+]: Secondary | ICD-10-CM | POA: Diagnosis not present

## 2022-08-29 DIAGNOSIS — Z79811 Long term (current) use of aromatase inhibitors: Secondary | ICD-10-CM | POA: Insufficient documentation

## 2022-08-29 DIAGNOSIS — C50811 Malignant neoplasm of overlapping sites of right female breast: Secondary | ICD-10-CM | POA: Diagnosis not present

## 2022-08-29 DIAGNOSIS — Z923 Personal history of irradiation: Secondary | ICD-10-CM | POA: Insufficient documentation

## 2022-08-29 NOTE — Progress Notes (Signed)
Radiation Oncology Follow up Note  Name: Sabrina Wilson   Date:   08/29/2022 MRN:  [id number] DOB: April 05, 1941    This 82 y.o. female presents to the clinic today for 1 month follow-up whole breast radiation to her right breast for stage IIb ER/PR positive invasive mammary carcinoma.  REFERRING PROVIDER: [name], MD  HPI: Patient is a 82 year old female now out 1 month having completed whole breast radiation to her right breast for stage IIb (pT2 pN1a M0) ER/PR positive invasive mammary carcinoma status post wide local excision.  Seen today in routine follow-up she is doing well she has had a little pain from what I believe is scar tissue in her right axilla.  She otherwise is without complaint.  She specifically denies breast tenderness cough or bone pain..  She is currently on Arimidex tolerating it well without side effect.  COMPLICATIONS OF TREATMENT: none  FOLLOW UP COMPLIANCE: keeps appointments   PHYSICAL EXAM:  BP (!) 158/79   Pulse 72   Temp 98.5 F (36.9 C) (Tympanic)   Resp 16   Ht 5' 6.5" (1.689 m)   Wt 178 lb (80.7 kg)   BMI 28.30 kg/m  Lungs are clear to A&P cardiac examination essentially unremarkable with regular rate and rhythm. No dominant mass or nodularity is noted in either breast in 2 positions examined. Incision is well-healed. No axillary or supraclavicular adenopathy is appreciated. Cosmetic result is excellent.  Still some slight hyperpigmentation of the skin which have assured her will resolve.  Well-developed well-nourished patient in NAD. HEENT reveals PERLA, EOMI, discs not visualized.  Oral cavity is clear. No oral mucosal lesions are identified. Neck is clear without evidence of cervical or supraclavicular adenopathy. Lungs are clear to A&P. Cardiac examination is essentially unremarkable with regular rate and rhythm without murmur rub or thrill. Abdomen is benign with no organomegaly or masses noted. Motor sensory and DTR levels are equal and  symmetric in the upper and lower extremities. Cranial nerves II through XII are grossly intact. Proprioception is intact. No peripheral adenopathy or edema is identified. No motor or sensory levels are noted. Crude visual fields are within normal range.  RADIOLOGY RESULTS: No current films for review  PLAN: Present time patient is out 1 month from whole breast radiation doing well very low side effect profile she has started endocrine therapy.  I have asked to see her back in 6 months for follow-up.  Patient is to call with any concerns at any time.  I would like to take this opportunity to thank you for allowing me to participate in the care of your patient.[name], MD

## 2022-08-30 ENCOUNTER — Other Ambulatory Visit: Payer: Self-pay

## 2022-08-30 NOTE — Telephone Encounter (Signed)
Rec'd completed application back from patient.   Which provider do I need to send this to for signature?

## 2022-08-31 NOTE — Telephone Encounter (Signed)
Faxed provider page to provider 08/31/22.

## 2022-09-02 ENCOUNTER — Ambulatory Visit: Payer: Medicare Other | Admitting: Family Medicine

## 2022-09-06 NOTE — Telephone Encounter (Signed)
Submitted application for ELIQUIS to [employer] for patient assistance.   Phone: [phone]  

## 2022-09-20 ENCOUNTER — Inpatient Hospital Stay: Payer: Medicare Other | Admitting: Licensed Clinical Social Worker

## 2022-09-20 ENCOUNTER — Encounter: Payer: Self-pay | Admitting: Licensed Clinical Social Worker

## 2022-09-20 ENCOUNTER — Inpatient Hospital Stay (HOSPITAL_BASED_OUTPATIENT_CLINIC_OR_DEPARTMENT_OTHER): Payer: Medicare Other | Admitting: Licensed Clinical Social Worker

## 2022-09-20 DIAGNOSIS — Z170 Estrogen receptor positive status [ER+]: Secondary | ICD-10-CM

## 2022-09-20 DIAGNOSIS — C50811 Malignant neoplasm of overlapping sites of right female breast: Secondary | ICD-10-CM | POA: Diagnosis not present

## 2022-09-20 DIAGNOSIS — Z85038 Personal history of other malignant neoplasm of large intestine: Secondary | ICD-10-CM

## 2022-09-20 DIAGNOSIS — Z800 Family history of malignant neoplasm of digestive organs: Secondary | ICD-10-CM

## 2022-09-20 NOTE — Progress Notes (Signed)
REFERRING PROVIDER: [name], MD [address] [place],  [place] [address]   PRIMARY PROVIDER:  [name], MD   PRIMARY REASON FOR VISIT:  1. Carcinoma of overlapping sites of right breast in female, estrogen receptor positive (HCC)   2. Personal history of colon cancer   3. Family history of colon cancer     I connected with Sabrina Wilson on 09/20/2022 at 2:00 PM EDT by telephone and verified that I am speaking with the correct person using two identifiers.    Patient location: home Provider location: [facility]   HISTORY OF PRESENT ILLNESS:   Sabrina Wilson, a 82 y.o. female, was seen for a [place] cancer genetics consultation due to a personal and family history of cancer.  Sabrina Wilson presents to clinic today to discuss the possibility of a hereditary predisposition to cancer, genetic testing, and to further clarify her future cancer risks, as well as potential cancer risks for family members.    In 2009, at the age of 5, Sabrina Wilson was diagnosed with stage II colon cancer, this was treated with surgery and chemotherapy. In 2023 at the age of 40,  of the invasive mammary carcinoma of the right breast. The treatment plan included lumpectomy adjuvant chemotherapy, adjuvant radiation, and now is on anastrazole.   CANCER HISTORY:      Oncology History Overview Note   # 2009- Sigmoid colon cancer stage II (T3 N0 4 lymph nodes sampled M0 stage II high risk there was obstruction and perforation abscess; Dr.Hern), workup included a low CEA preoperatively and a chest x-ray and CT scan abdomen pelvis negative for metastatic disease, S/P colostomy; s/p FOLFOX x 6 months. [Dr.Gittin ]   # LATER REVERSED , K RAS  wild type B RAF not evaluated   Also initial iron deficiency with anemia thrombocytosis, high platelets considered reactive, workup includes a normal PCR for CML and a negative [id number] mutation   DIAGNOSIS:  A. BREAST, RIGHT; LUMPECTOMY:  - INVASIVE MAMMARY  CARCINOMA.  - DUCTAL CARCINOMA IN SITU (DCIS).  - SEE CANCER SUMMARY BELOW.  - BIOPSY SITE CHANGE WITH CLIP (2).  - FIBROUS TISSUE WITH CYST FORMATION AND FLORID DUCTAL HYPERPLASIA.  - SKIN AND NIPPLE NEGATIVE FOR MALIGNANCY.  - VASCULAR CALCIFICATION.   B. SENTINEL LYMPH NODES 1 AND 2, RIGHT AXILLA; EXCISION:  - METASTATIC CARCINOMA INVOLVES ONE OF TWO LYMPH NODES (1/2).   C. NON-SENTINEL LYMPH NODES, RIGHT AXILLA; EXCISION:  - ONE LYMPH NODE NEGATIVE FOR MALIGNANCY (0/1).   CANCER CASE SUMMARY: INVASIVE CARCINOMA OF THE BREAST  Standard(s): AJCC-UICC 8   SPECIMEN  Procedure: Lumpectomy  Specimen Laterality: Right   TUMOR  Histologic Type: Invasive carcinoma of no special type (ductal)  Histologic Grade (Nottingham Histologic Score)       Glandular (Acinar)/Tubular Differentiation: 3       Nuclear Pleomorphism: 3       Mitotic Rate: 3       Overall Grade: 3  Tumor Size: 24 mm  Tumor Focality: Single focus of invasive carcinoma  Ductal Carcinoma In Situ (DCIS): Present, nuclear grade 2-3  Tumor Extent: Not applicable  Lymphatic and/or Vascular Invasion: Present  Treatment Effect in the Breast: No known presurgical therapy   MARGINS  Margin Status for Invasive Carcinoma: All margins negative for invasive  carcinoma       Distance from closest margin: 10 mm       Specify closest margin: Superior   Margin Status for  DCIS: All margins negative for DCIS       Distance from DCIS to closest margin: 10 mm       Specify closest margin: Superior   REGIONAL LYMPH NODES  Regional Lymph Node Status: Tumor present in regional lymph node(s)       Number of Lymph Nodes with Macrometastases (greater than 2 mm): 1       Number of Lymph Nodes with Micrometastases (greater than 0.2 mm to  2 mm and/or greater than 200 cells): 0       Number of Lymph Nodes with Isolated Tumor Cells (0.2 mm or less OR  200 cells or less): 0       Size of Largest Metastatic Deposit: 3 mm      Extranodal  Extension: Not identified       Total Number of Lymph Nodes Examined (sentinel and non-sentinel): 3       Number of Sentinel Nodes Examined: 2   DISTANT METASTASIS  Distant Site(s) Involved, if applicable: Not applicable   PATHOLOGIC STAGE CLASSIFICATION (pTNM, AJCC 8th Edition):  Modified Classification: Not applicable  pT Category: pT2  T Suffix: Not applicable  pN Category: pN1a  N Suffix: (sn)  pM Category: Not applicable   SPECIAL STUDIES  Breast Biomarker Testing Performed on Previous Outside Biopsy:  23-250-T11  Per outside report:  Estrogen Receptor (ER) Status: POSITIVE          Percentage of cells with nuclear positivity: Greater than 90%          Average intensity of staining: Strong   Progesterone Receptor (PgR) Status: POSITIVE          Percentage of cells with nuclear positivity: 60%          Average intensity of staining: Strong   HER2 (by immunohistochemistry): EQUIVOCAL (Score 2+)  HER2 FISH: NEGATIVE   Ki-67: Not performed    # Cytoxan Taxotere x 4 cycles; finished JAN, 2024. [Neutropenic fevers s/p cycle #4;  -FEB 2nd, 2024-right lung PE; left lower LE  DVT- on eliquis.    # FEB 19th-start RT   IMPRESSION: 1. Complex cystic and solid-appearing mass within the subareolar RIGHT breast, overall measuring 3.1 cm greatest dimension, the solid enhancing component at the anterior-lateral aspect of the mass measuring 2 x 1 cm, abutting the overlying skin but without evidence of involvement/enhancement of the skin. Susceptibility artifact within the mass may represent a biopsy clip (if biopsy has been performed at an outside site since the diagnostic mammogram/ultrasound of 12/28/2021) or this artifact could represent calcifications within the mass. This mass corresponds to the recent ultrasound finding of a complex irregular mass at the 11 o'clock axis of the retroareolar RIGHT breast. 2. No evidence of multifocal or multicentric disease within the RIGHT  breast. 3. No evidence of contralateral disease in the LEFT breast.   RECOMMENDATION: If has not already been performed, recommend ultrasound-guided biopsy of the suspicious mass in the subareolar RIGHT breast, with preferential targeting of the anterior-lateral component of the mass which is shown to be the enhancing component on today's MRI.   BI-RADS CATEGORY  4: Suspicious.      Cancer of sigmoid (HCC) (Resolved)   Carcinoma of overlapping sites of right breast in female, estrogen receptor positive (HCC)   02/14/2022 Initial Diagnosis     Carcinoma of overlapping sites of right breast in female, estrogen receptor positive (HCC)     02/14/2022 Cancer Staging     Staging form: Breast, AJCC  8th Edition - Pathologic: Stage IIA (pT2, pN1, cM0, G3, ER+, PR+, HER2-) - Signed by [name], MD on 02/14/2022 Histologic grading system: 3 grade system     02/23/2022 -  Chemotherapy     Patient is on Treatment Plan : BREAST TC q21d          RISK FACTORS:  Menarche was at age 15.  First live birth at age 52.  Ovaries intact: yes.  Hysterectomy: no.  Menopausal status: postmenopausal.  Colonoscopy: yes;  has one scheduled in July/plans to schedule .       Past Medical History:  Diagnosis Date   Actinic keratosis 02/12/2020    R lat calf (hypertrophic)   Arthritis     Cancer of sigmoid (HCC) 06/17/2016   Colon cancer (HCC) 2009    T3,N0; s/p resection  Dr. Earnestine Leys and chemotherapy by Dr. Neale Burly   Diastolic dysfunction      a. 08/2020 Echo: EF 60-65%, no rwma, Gr1 DD, nl RV size/fxn. Mild MR.   Hernia of flank     History of actinic keratoses 08/11/2020    Bx proven at left lateral ankle proximal, LN2 10/08/20   History of stress test      a. 09/2020 MV: EF >65%. No ischemia/infarct. Mild Ao Ca2+ and minimal Cor Ca2+.   Hypertension     Neuropathy     Polyp at cervical os      s/p resection Dr. Hyacinth Meeker   Skin cancer 01/22/2020    surface of an atypical verrucous  squamous proliferation    Squamous cell carcinoma of skin 07/16/2020    Left lateral ankle anterior, treated with [facility] 08-11-2020           Past Surgical History:  Procedure Laterality Date   BREAST BIOPSY Right 05/2014    Dr. Rutherford Nail office-benign   BREAST LUMPECTOMY WITH SENTINEL LYMPH NODE BIOPSY Right 01/21/2022    Procedure: BREAST LUMPECTOMY WITH SENTINEL LYMPH NODE BX;  Surgeon: [name], MD;  Location: [facility] ORS;  Service: General;  Laterality: Right;   BREAST SURGERY Right February 2016    Vacuum assisted biopsy for recurrent cyst, fibrocystic changes without evidence of malignancy.   CATARACT EXTRACTION W/PHACO Left 01/13/2015    Procedure: CATARACT EXTRACTION PHACO AND INTRAOCULAR LENS PLACEMENT (IOC);  Surgeon: Galen Manila, MD;  Location: [facility] ORS;  Service: Ophthalmology;  Laterality: Left;  [place]  1:02.9 AP   19.2 CDE  12.06 casette lot #  [id number]   CATARACT EXTRACTION W/PHACO Right 02/03/2015    Procedure: CATARACT EXTRACTION PHACO AND INTRAOCULAR LENS PLACEMENT (IOC);  Surgeon: Galen Manila, MD;  Location: [facility] ORS;  Service: Ophthalmology;  Laterality: Right;  us00:53 ap46.5 [id number]   COLECTOMY       COLONOSCOPY   2015    Dr Bluford Kaufmann   COLONOSCOPY WITH PROPOFOL N/A 09/13/2017    Procedure: COLONOSCOPY WITH PROPOFOL;  Surgeon: [name], MD;  Location: [facility];  Service: Endoscopy;  Laterality: N/A;   COLONOSCOPY WITH PROPOFOL N/A 11/08/2017    Procedure: COLONOSCOPY WITH PROPOFOL;  Surgeon: [name], MD;  Location: [facility] END[facility]COPY;  Service: Endoscopy;  Laterality: N/A;   colostomy reversal       DILATION AND CURETTAGE OF UTERUS   2014    Dr. Hyacinth Meeker, benign per pt   EYE SURGERY       HERNIA REPAIR   07/13/12    ventral    SKIN CANCER EXCISION       TONSILLECTOMY  FAMILY HISTORY:  We obtained a detailed, 4-generation family history.  Significant diagnoses are listed below:      Family History  Problem Relation Age of  Onset   Stroke Mother     Diabetes Mother     Colon cancer Father          dx 20s   Stroke Sister     Colon cancer Brother          dx 23s-70s   Brain cancer Brother          dx 33s-70s    Sabrina Wilson has 1 son, 1 daughter, no cancers. She had 1 sister and 2 brothers. One brother had brain cancer and died in his 40s-70s and her other brother had colon cancer and died in his 45s-70s.   Sabrina Wilson mother passed at 14 of a stroke. No known cancers on this side of the family.   Sabrina Wilson father passed of colon cancer in his 73s. No other known cancers on this side of the family.   Sabrina Wilson is unaware of previous family history of genetic testing for hereditary cancer risks. There is no reported Ashkenazi Jewish ancestry. There is no known consanguinity.     GENETIC COUNSELING ASSESSMENT: Sabrina Wilson is a 82 y.o. female with a personal and family history of colon cancer which is somewhat suggestive of a hereditary cancer syndrome and predisposition to cancer. We, therefore, discussed and recommended the following at today's visit.    DISCUSSION: We discussed that approximately 10% of colorectal cancer is hereditary. Most cases of hereditary colon cancer are associated with Lynch syndrome genes, although there are other genes associated with hereditary cancer as well. Cancers and risks are gene specific. We discussed that testing is beneficial for several reasons including knowing about cancer risks, identifying potential screening and risk-reduction options that may be appropriate, and to understand if other family members could be at risk for cancer and allow them to undergo genetic testing.    We reviewed the characteristics, features and inheritance patterns of hereditary cancer syndromes. We also discussed genetic testing, including the appropriate family members to test, the process of testing, insurance coverage and turn-around-time for results. We discussed the implications of a negative,  positive and/or variant of uncertain significant result. We recommended Sabrina Wilson pursue genetic testing for the [facility] Multi-Cancer+RNA gene panel.    Based on Sabrina Wilson's personal and family history of cancer, she meets medical criteria for genetic testing. Despite that she meets criteria, she may still have an out of pocket cost.    PLAN: After considering the risks, benefits, and limitations, Sabrina Wilson provided informed consent to pursue genetic testing and the blood sample was sent to [facility] for analysis of the Multi-Cancer+RNA panel. Results should be available within approximately 2-3 weeks' time, at which point they will be disclosed by telephone to Sabrina Wilson, as will any additional recommendations warranted by these results. Sabrina Wilson will receive a summary of her genetic counseling visit and a copy of her results once available. This information will also be available in Epic.    Sabrina Wilson. Abramowicz questions were answered to her satisfaction today. Our contact information was provided should additional questions or concerns arise. Thank you for the referral and allowing [place] to share in the care of your patient.    Lacy Duverney, Sabrina Wilson, [facility] Genetic Counselor [email or web address] Phone: [phone]   The patient was seen for a total of 20 minutes in virtual genetic counseling.  Dr. Orlie Dakin was available for discussion regarding this case.    _______________________________________________________________________ For Office Staff:  Number of people involved in session: 1 Was an Intern/ student involved with case: no

## 2022-09-20 NOTE — Progress Notes (Unsigned)
REFERRING PROVIDER: Brahmanday, Govinda R, MD [address] [place],  [place] [address]   PRIMARY PROVIDER:  Sonnenberg, Eric G, MD   PRIMARY REASON FOR VISIT:  1. Carcinoma of overlapping sites of right breast in female, estrogen receptor positive (HCC)   2. Personal history of colon cancer   3. Family history of colon cancer     I connected with Sabrina Wilson on 09/20/2022 at 2:00 PM EDT by telephone and verified that I am speaking with the correct person using two identifiers.    Patient location: home Provider location: [facility]   HISTORY OF PRESENT ILLNESS:   [name], a 82 y.o. female, was seen for a [place] cancer genetics consultation due to a personal and family history of cancer.  Sabrina Wilson presents to clinic today to discuss the possibility of a hereditary predisposition to cancer, genetic testing, and to further clarify her future cancer risks, as well as potential cancer risks for family members.    In 2009, at the age of 66, Sabrina Wilson was diagnosed with stage II colon cancer, this was treated with surgery and chemotherapy. In 2023 at the age of 82,  of the invasive mammary carcinoma of the right breast. The treatment plan included lumpectomy adjuvant chemotherapy, adjuvant radiation, and now is on anastrazole.   CANCER HISTORY:      Oncology History Overview Note   # 2009- Sigmoid colon cancer stage II (T3 N0 4 lymph nodes sampled M0 stage II high risk there was obstruction and perforation abscess; Dr.Hern), workup included a low CEA preoperatively and a chest x-ray and CT scan abdomen pelvis negative for metastatic disease, S/P colostomy; s/p FOLFOX x 6 months. [Dr.Gittin ]   # LATER REVERSED , K RAS  wild type B RAF not evaluated   Also initial iron deficiency with anemia thrombocytosis, high platelets considered reactive, workup includes a normal PCR for CML and a negative [id number] mutation   DIAGNOSIS:  A. BREAST, RIGHT; LUMPECTOMY:  - INVASIVE MAMMARY  CARCINOMA.  - DUCTAL CARCINOMA IN SITU (DCIS).  - SEE CANCER SUMMARY BELOW.  - BIOPSY SITE CHANGE WITH CLIP (2).  - FIBROUS TISSUE WITH CYST FORMATION AND FLORID DUCTAL HYPERPLASIA.  - SKIN AND NIPPLE NEGATIVE FOR MALIGNANCY.  - VASCULAR CALCIFICATION.   B. SENTINEL LYMPH NODES 1 AND 2, RIGHT AXILLA; EXCISION:  - METASTATIC CARCINOMA INVOLVES ONE OF TWO LYMPH NODES (1/2).   C. NON-SENTINEL LYMPH NODES, RIGHT AXILLA; EXCISION:  - ONE LYMPH NODE NEGATIVE FOR MALIGNANCY (0/1).   CANCER CASE SUMMARY: INVASIVE CARCINOMA OF THE BREAST  Standard(s): AJCC-UICC 8   SPECIMEN  Procedure: Lumpectomy  Specimen Laterality: Right   TUMOR  Histologic Type: Invasive carcinoma of no special type (ductal)  Histologic Grade (Nottingham Histologic Score)       Glandular (Acinar)/Tubular Differentiation: 3       Nuclear Pleomorphism: 3       Mitotic Rate: 3       Overall Grade: 3  Tumor Size: 24 mm  Tumor Focality: Single focus of invasive carcinoma  Ductal Carcinoma In Situ (DCIS): Present, nuclear grade 2-3  Tumor Extent: Not applicable  Lymphatic and/or Vascular Invasion: Present  Treatment Effect in the Breast: No known presurgical therapy   MARGINS  Margin Status for Invasive Carcinoma: All margins negative for invasive  carcinoma       Distance from closest margin: 10 mm       Specify closest margin: Superior   Margin Status for   DCIS: All margins negative for DCIS       Distance from DCIS to closest margin: 10 mm       Specify closest margin: Superior   REGIONAL LYMPH NODES  Regional Lymph Node Status: Tumor present in regional lymph node(s)       Number of Lymph Nodes with Macrometastases (greater than 2 mm): 1       Number of Lymph Nodes with Micrometastases (greater than 0.2 mm to  2 mm and/or greater than 200 cells): 0       Number of Lymph Nodes with Isolated Tumor Cells (0.2 mm or less OR  200 cells or less): 0       Size of Largest Metastatic Deposit: 3 mm      Extranodal  Extension: Not identified       Total Number of Lymph Nodes Examined (sentinel and non-sentinel): 3       Number of Sentinel Nodes Examined: 2   DISTANT METASTASIS  Distant Site(s) Involved, if applicable: Not applicable   PATHOLOGIC STAGE CLASSIFICATION (pTNM, AJCC 8th Edition):  Modified Classification: Not applicable  pT Category: pT2  T Suffix: Not applicable  pN Category: pN1a  N Suffix: (sn)  pM Category: Not applicable   SPECIAL STUDIES  Breast Biomarker Testing Performed on Previous Outside Biopsy:  [id number]  Per outside report:  Estrogen Receptor (ER) Status: POSITIVE          Percentage of cells with nuclear positivity: Greater than 90%          Average intensity of staining: Strong   Progesterone Receptor (PgR) Status: POSITIVE          Percentage of cells with nuclear positivity: 60%          Average intensity of staining: Strong   HER2 (by immunohistochemistry): EQUIVOCAL (Score 2+)  HER2 FISH: NEGATIVE   Ki-67: Not performed    # Cytoxan Taxotere x 4 cycles; finished JAN, 2024. [Neutropenic fevers s/p cycle #4;  -FEB 2nd, 2024-right lung PE; left lower LE  DVT- on eliquis.    # FEB 19th-start RT   IMPRESSION: 1. Complex cystic and solid-appearing mass within the subareolar RIGHT breast, overall measuring 3.1 cm greatest dimension, the solid enhancing component at the anterior-lateral aspect of the mass measuring 2 x 1 cm, abutting the overlying skin but without evidence of involvement/enhancement of the skin. Susceptibility artifact within the mass may represent a biopsy clip (if biopsy has been performed at an outside site since the diagnostic mammogram/ultrasound of 12/28/2021) or this artifact could represent calcifications within the mass. This mass corresponds to the recent ultrasound finding of a complex irregular mass at the 11 o'clock axis of the retroareolar RIGHT breast. 2. No evidence of multifocal or multicentric disease within the RIGHT  breast. 3. No evidence of contralateral disease in the LEFT breast.   RECOMMENDATION: If has not already been performed, recommend ultrasound-guided biopsy of the suspicious mass in the subareolar RIGHT breast, with preferential targeting of the anterior-lateral component of the mass which is shown to be the enhancing component on today's MRI.   BI-RADS CATEGORY  4: Suspicious.      Cancer of sigmoid (HCC) (Resolved)   Carcinoma of overlapping sites of right breast in female, estrogen receptor positive (HCC)   02/14/2022 Initial Diagnosis     Carcinoma of overlapping sites of right breast in female, estrogen receptor positive (HCC)     02/14/2022 Cancer Staging     Staging form: Breast, AJCC   8th Edition - Pathologic: Stage IIA (pT2, pN1, cM0, G3, ER+, PR+, HER2-) - Signed by Brahmanday, Govinda R, MD on 02/14/2022 Histologic grading system: 3 grade system     02/23/2022 -  Chemotherapy     Patient is on Treatment Plan : BREAST TC q21d          RISK FACTORS:  Menarche was at age 13.  First live birth at age 16.  Ovaries intact: yes.  Hysterectomy: no.  Menopausal status: postmenopausal.  Colonoscopy: yes;  has one scheduled in July/plans to schedule .       Past Medical History:  Diagnosis Date   Actinic keratosis 02/12/2020    R lat calf (hypertrophic)   Arthritis     Cancer of sigmoid (HCC) 06/17/2016   Colon cancer (HCC) 2009    T3,N0; s/p resection  Dr. Hearn and chemotherapy by Dr. Gitten   Diastolic dysfunction      a. 08/2020 Echo: EF 60-65%, no rwma, Gr1 DD, nl RV size/fxn. Mild MR.   Hernia of flank     History of actinic keratoses 08/11/2020    Bx proven at left lateral ankle proximal, LN2 10/08/20   History of stress test      a. 09/2020 MV: EF >65%. No ischemia/infarct. Mild Ao Ca2+ and minimal Cor Ca2+.   Hypertension     Neuropathy     Polyp at cervical os      s/p resection Dr. Miller   Skin cancer 01/22/2020    surface of an atypical verrucous  squamous proliferation    Squamous cell carcinoma of skin 07/16/2020    Left lateral ankle anterior, treated with EDC 08-11-2020           Past Surgical History:  Procedure Laterality Date   BREAST BIOPSY Right 05/2014    Dr. Byrnett's office-benign   BREAST LUMPECTOMY WITH SENTINEL LYMPH NODE BIOPSY Right 01/21/2022    Procedure: BREAST LUMPECTOMY WITH SENTINEL LYMPH NODE BX;  Surgeon: Byrnett, Jeffrey W, MD;  Location: [facility];  Service: General;  Laterality: Right;   BREAST SURGERY Right February 2016    Vacuum assisted biopsy for recurrent cyst, fibrocystic changes without evidence of malignancy.   CATARACT EXTRACTION W/PHACO Left 01/13/2015    Procedure: CATARACT EXTRACTION PHACO AND INTRAOCULAR LENS PLACEMENT (IOC);  Surgeon: William Porfilio, MD;  Location: [facility];  Service: Ophthalmology;  Laterality: Left;  US  1:02.9 AP   19.2 CDE  12.06 casette lot #  [id number]   CATARACT EXTRACTION W/PHACO Right 02/03/2015    Procedure: CATARACT EXTRACTION PHACO AND INTRAOCULAR LENS PLACEMENT (IOC);  Surgeon: William Porfilio, MD;  Location: [facility];  Service: Ophthalmology;  Laterality: Right;  us00:53 ap46.5 cde10.79   COLECTOMY       COLONOSCOPY   2015    Dr Oh   COLONOSCOPY WITH PROPOFOL N/A 09/13/2017    Procedure: COLONOSCOPY WITH PROPOFOL;  Surgeon: Byrnett, Jeffrey W, MD;  Location: [facility];  Service: Endoscopy;  Laterality: N/A;   COLONOSCOPY WITH PROPOFOL N/A 11/08/2017    Procedure: COLONOSCOPY WITH PROPOFOL;  Surgeon: Byrnett, Jeffrey W, MD;  Location: [facility];  Service: Endoscopy;  Laterality: N/A;   colostomy reversal       DILATION AND CURETTAGE OF UTERUS   2014    Dr. Miller, benign per pt   EYE SURGERY       HERNIA REPAIR   07/13/12    ventral    SKIN CANCER EXCISION       TONSILLECTOMY            FAMILY HISTORY:  We obtained a detailed, 4-generation family history.  Significant diagnoses are listed below:      Family History  Problem Relation Age of  Onset   Stroke Mother     Diabetes Mother     Colon cancer Father          dx 60s   Stroke Sister     Colon cancer Brother          dx 60s-70s   Brain cancer Brother          dx 60s-70s    Ms. Deems has 1 son, 1 daughter, no cancers. She had 1 sister and 2 brothers. One brother had brain cancer and died in his 60s-70s and her other brother had colon cancer and died in his 60s-70s.   Ms. Flansburg's mother passed at 81 of a stroke. No known cancers on this side of the family.   Ms. Garofano's father passed of colon cancer in his 60s. No other known cancers on this side of the family.   Ms. Zerkle is unaware of previous family history of genetic testing for hereditary cancer risks. There is no reported Ashkenazi Jewish ancestry. There is no known consanguinity.     GENETIC COUNSELING ASSESSMENT: Ms. Gomez is a 82 y.o. female with a personal and family history of colon cancer which is somewhat suggestive of a hereditary cancer syndrome and predisposition to cancer. We, therefore, discussed and recommended the following at today's visit.    DISCUSSION: We discussed that approximately 10% of colorectal cancer is hereditary. Most cases of hereditary colon cancer are associated with Lynch syndrome genes, although there are other genes associated with hereditary cancer as well. Cancers and risks are gene specific. We discussed that testing is beneficial for several reasons including knowing about cancer risks, identifying potential screening and risk-reduction options that may be appropriate, and to understand if other family members could be at risk for cancer and allow them to undergo genetic testing.    We reviewed the characteristics, features and inheritance patterns of hereditary cancer syndromes. We also discussed genetic testing, including the appropriate family members to test, the process of testing, insurance coverage and turn-around-time for results. We discussed the implications of a negative,  positive and/or variant of uncertain significant result. We recommended Ms. Zollars pursue genetic testing for the [facility] Multi-Cancer+RNA gene panel.    Based on Ms. Arias's personal and family history of cancer, she meets medical criteria for genetic testing. Despite that she meets criteria, she may still have an out of pocket cost.    PLAN: After considering the risks, benefits, and limitations, Ms. Purdy provided informed consent to pursue genetic testing and the blood sample was sent to [facility] for analysis of the Multi-Cancer+RNA panel. Results should be available within approximately 2-3 weeks' time, at which point they will be disclosed by telephone to Ms. Geyer, as will any additional recommendations warranted by these results. Ms. Helbert will receive a summary of her genetic counseling visit and a copy of her results once available. This information will also be available in Epic.    Ms. Want's questions were answered to her satisfaction today. Our contact information was provided should additional questions or concerns arise. Thank you for the referral and allowing us to share in the care of your patient.    Haylee Mcanany, MS, LCGC Genetic Counselor [email or web address] Phone: [phone]   The patient was seen for a total of 20 minutes in virtual genetic counseling.

## 2022-09-21 ENCOUNTER — Inpatient Hospital Stay (HOSPITAL_BASED_OUTPATIENT_CLINIC_OR_DEPARTMENT_OTHER): Payer: Medicare Other | Admitting: Internal Medicine

## 2022-09-21 ENCOUNTER — Inpatient Hospital Stay: Payer: Medicare Other | Admitting: Pharmacist

## 2022-09-21 ENCOUNTER — Inpatient Hospital Stay: Payer: Medicare Other

## 2022-09-21 ENCOUNTER — Encounter: Payer: Self-pay | Admitting: Internal Medicine

## 2022-09-21 VITALS — BP 169/82 | HR 79 | Temp 97.9°F | Ht 66.5 in | Wt 180.2 lb

## 2022-09-21 DIAGNOSIS — Z79811 Long term (current) use of aromatase inhibitors: Secondary | ICD-10-CM | POA: Diagnosis not present

## 2022-09-21 DIAGNOSIS — M8580 Other specified disorders of bone density and structure, unspecified site: Secondary | ICD-10-CM | POA: Diagnosis not present

## 2022-09-21 DIAGNOSIS — C50811 Malignant neoplasm of overlapping sites of right female breast: Secondary | ICD-10-CM | POA: Diagnosis not present

## 2022-09-21 DIAGNOSIS — Z170 Estrogen receptor positive status [ER+]: Secondary | ICD-10-CM

## 2022-09-21 DIAGNOSIS — Z87891 Personal history of nicotine dependence: Secondary | ICD-10-CM | POA: Diagnosis not present

## 2022-09-21 DIAGNOSIS — Z800 Family history of malignant neoplasm of digestive organs: Secondary | ICD-10-CM | POA: Diagnosis not present

## 2022-09-21 DIAGNOSIS — Z85038 Personal history of other malignant neoplasm of large intestine: Secondary | ICD-10-CM | POA: Diagnosis not present

## 2022-09-21 DIAGNOSIS — Z79899 Other long term (current) drug therapy: Secondary | ICD-10-CM | POA: Diagnosis not present

## 2022-09-21 NOTE — Assessment & Plan Note (Addendum)
#   Right breast cancer -[facility]; ER/PR positive Her 2 Negative ;  G-3; LVI + T2N1.  Stage II [OCT 2023; Dr.Byrnett] s/p Lumpectomy; node positive-Oncotype RS- 35- s/p  adjuvant chemotherapy with Taxotere-Cytoxan every 3 weeks x4 cycles. Consider abema x 2 years/zometa.   S/p postlumpectomy radiation April 3rd 2024.    # Currently on anastrozole once a day [April 2024]-tolerating well without any major side effects.  Continue for total of 5 to 10 years.  Also discussed regarding use of adjuvant abemaciclib-CDK inhibitor for her history of high risk breast cancer T2 N1.  Also discussed regarding the potential side effects including but not limited to diarrhea nausea vomiting/low blood counts risk of infections etc.  Patient interested.  Discussed with pharmacy regarding co-pay assistance program.  Patient will need to follow-up with MD/APP prior to starting treatment.  # [2nd,FEB 2024] CTA-  acute pulmonary embolism, with new segmental to subsegmental embolus present in the right lower lobe/right lower lobe-pulmonary infarct. LEFT LE- Acute DVT-on anticoagulation with eliquis on 5 mg BID X6 months- [provoked sec to chemo]. Stop eliquis in End of July/2024.  Patient would be cleared to have surgery for the eyes after coming off her anticoagulation.   # Bone health-2023 osteopenic T-score of -1.1.Continue calcium plus vitamin D.- stable.  I discussed the role of adjuvant Zometa every 6 months x 3 years. Awaiting dental evaluation [[facility] vs others- will send referral if needed].  Continue ca+Vit D BID.   # Genetic testing: brother-& father- colon cancer; other brother- brain tumor; no breast cancers in family. pending genetic blood work today.   # Right arm decreased range of motion/swelling- ?  Lymphedema- s/p evaluation- Maureen/OT- stable.   # IV Access: PIV   # DISPOSITION: # follow in 8 weeks-  MD;labs- cbc/cmp; Dr.B

## 2022-09-21 NOTE — Progress Notes (Signed)
C/o BP being high, she was taken off her hctz due to being on eliquis.  Would like to know when she could r/s her eye surgery?

## 2022-09-21 NOTE — Progress Notes (Signed)
Oral Chemotherapy Clinic [facility]  [phone] [phone]  Patient Care Team: [name], MD as PCP - General (Family Medicine) [name], MD as PCP - Cardiology (Cardiology) [name], [name], MD as Consulting Physician (General Surgery) [name], MD (Internal Medicine) [name], RN as Oncology Nurse Navigator [name], MD as Consulting Physician (Radiation Oncology) [name], MD as Consulting Physician (Internal Medicine) Sung Amabile, DO as Consulting Physician (General Surgery)   Name of the patient: Sabrina Wilson  [id number]  14-Dec-1940   Date of visit: 09/21/22  HPI: Patient is a 82 y.o. female with stage II, ER/PR positive, HER2 negative, breast cancer. Planned adjuvant treatment with abemaciclib with a planned duration of 2 years.  Reason for Consult: abemaciclib oral chemotherapy education.   PAST MEDICAL HISTORY: Past Medical History:  Diagnosis Date   Actinic keratosis 02/12/2020   R lat calf (hypertrophic)   Arthritis    Cancer of sigmoid (HCC) 06/17/2016   Colon cancer (HCC) 2009   T3,N0; s/p resection  Dr. Earnestine Leys and chemotherapy by Dr. Neale Burly   Diastolic dysfunction    a. 08/2020 Echo: EF 60-65%, no rwma, Gr1 DD, nl RV size/fxn. Mild MR.   Hernia of flank    History of actinic keratoses 08/11/2020   Bx proven at left lateral ankle proximal, LN2 10/08/20   History of stress test    a. 09/2020 MV: EF >65%. No ischemia/infarct. Mild Ao Ca2+ and minimal Cor Ca2+.   Hypertension    Neuropathy    Polyp at cervical os    s/p resection Dr. Hyacinth Meeker   Skin cancer 01/22/2020   surface of an atypical verrucous squamous proliferation    Squamous cell carcinoma of skin 07/16/2020   Left lateral ankle anterior, treated with [facility] 08-11-2020    HEMATOLOGY/ONCOLOGY HISTORY:  Oncology History Overview Note  # 2009- Sigmoid colon cancer stage II (T3 N0 4 lymph nodes sampled M0 stage II  high risk there was obstruction and perforation abscess; Dr.Hern), workup included a low CEA preoperatively and a chest x-ray and CT scan abdomen pelvis negative for metastatic disease, S/P colostomy; s/p FOLFOX x 6 months. [Dr.Gittin ]  # LATER REVERSED , K RAS  wild type B RAF not evaluated   Also initial iron deficiency with anemia thrombocytosis, high platelets considered reactive, workup includes a normal PCR for CML and a negative [id number] mutation  DIAGNOSIS:  A. BREAST, RIGHT; LUMPECTOMY:  - INVASIVE MAMMARY CARCINOMA.  - DUCTAL CARCINOMA IN SITU (DCIS).  - SEE CANCER SUMMARY BELOW.  - BIOPSY SITE CHANGE WITH CLIP (2).  - FIBROUS TISSUE WITH CYST FORMATION AND FLORID DUCTAL HYPERPLASIA.  - SKIN AND NIPPLE NEGATIVE FOR MALIGNANCY.  - VASCULAR CALCIFICATION.   B. SENTINEL LYMPH NODES 1 AND 2, RIGHT AXILLA; EXCISION:  - METASTATIC CARCINOMA INVOLVES ONE OF TWO LYMPH NODES (1/2).   C. NON-SENTINEL LYMPH NODES, RIGHT AXILLA; EXCISION:  - ONE LYMPH NODE NEGATIVE FOR MALIGNANCY (0/1).   CANCER CASE SUMMARY: INVASIVE CARCINOMA OF THE BREAST  Standard(s): AJCC-UICC 8   SPECIMEN  Procedure: Lumpectomy  Specimen Laterality: Right   TUMOR  Histologic Type: Invasive carcinoma of no special type (ductal)  Histologic Grade (Nottingham Histologic Score)       Glandular (Acinar)/Tubular Differentiation: 3       Nuclear Pleomorphism: 3       Mitotic Rate: 3       Overall Grade: 3  Tumor Size: 24 mm  Tumor Focality:  Single focus of invasive carcinoma  Ductal Carcinoma In Situ (DCIS): Present, nuclear grade 2-3  Tumor Extent: Not applicable  Lymphatic and/or Vascular Invasion: Present  Treatment Effect in the Breast: No known presurgical therapy   MARGINS  Margin Status for Invasive Carcinoma: All margins negative for invasive  carcinoma       Distance from closest margin: 10 mm       Specify closest margin: Superior   Margin Status for DCIS: All margins negative for DCIS        Distance from DCIS to closest margin: 10 mm       Specify closest margin: Superior   REGIONAL LYMPH NODES  Regional Lymph Node Status: Tumor present in regional lymph node(s)       Number of Lymph Nodes with Macrometastases (greater than 2 mm): 1       Number of Lymph Nodes with Micrometastases (greater than 0.2 mm to  2 mm and/or greater than 200 cells): 0       Number of Lymph Nodes with Isolated Tumor Cells (0.2 mm or less OR  200 cells or less): 0       Size of Largest Metastatic Deposit: 3 mm      Extranodal Extension: Not identified       Total Number of Lymph Nodes Examined (sentinel and non-sentinel): 3       Number of Sentinel Nodes Examined: 2   DISTANT METASTASIS  Distant Site(s) Involved, if applicable: Not applicable   PATHOLOGIC STAGE CLASSIFICATION (pTNM, AJCC 8th Edition):  Modified Classification: Not applicable  pT Category: pT2  T Suffix: Not applicable  pN Category: pN1a  N Suffix: (sn)  pM Category: Not applicable   SPECIAL STUDIES  Breast Biomarker Testing Performed on Previous Outside Biopsy:  [id number]  Per outside report:  Estrogen Receptor (ER) Status: POSITIVE          Percentage of cells with nuclear positivity: Greater than 90%          Average intensity of staining: Strong   Progesterone Receptor (PgR) Status: POSITIVE          Percentage of cells with nuclear positivity: 60%          Average intensity of staining: Strong   HER2 (by immunohistochemistry): EQUIVOCAL (Score 2+)  HER2 FISH: NEGATIVE   Ki-67: Not performed   # Cytoxan Taxotere x 4 cycles; finished JAN, 2024. [Neutropenic fevers s/p cycle #4;  -FEB 2nd, 2024-right lung PE; left lower LE  DVT- on eliquis.   # FEB 19th-start RT  IMPRESSION: 1. Complex cystic and solid-appearing mass within the subareolar RIGHT breast, overall measuring 3.1 cm greatest dimension, the solid enhancing component at the anterior-lateral aspect of the mass measuring 2 x 1 cm, abutting the  overlying skin but without evidence of involvement/enhancement of the skin. Susceptibility artifact within the mass may represent a biopsy clip (if biopsy has been performed at an outside site since the diagnostic mammogram/ultrasound of 12/28/2021) or this artifact could represent calcifications within the mass. This mass corresponds to the recent ultrasound finding of a complex irregular mass at the 11 o'clock axis of the retroareolar RIGHT breast. 2. No evidence of multifocal or multicentric disease within the RIGHT breast. 3. No evidence of contralateral disease in the LEFT breast.   RECOMMENDATION: If has not already been performed, recommend ultrasound-guided biopsy of the suspicious mass in the subareolar RIGHT breast, with preferential targeting of the anterior-lateral component of the mass which is shown to  be the enhancing component on today's MRI.   BI-RADS CATEGORY  4: Suspicious.     Cancer of sigmoid (HCC) (Resolved)  Carcinoma of overlapping sites of right breast in female, estrogen receptor positive (HCC)  02/14/2022 Initial Diagnosis   Carcinoma of overlapping sites of right breast in female, estrogen receptor positive (HCC)   02/14/2022 Cancer Staging   Staging form: Breast, AJCC 8th Edition - Pathologic: Stage IIA (pT2, pN1, cM0, G3, ER+, PR+, HER2-) - Signed by [name], MD on 02/14/2022 Histologic grading system: 3 grade system   02/23/2022 -  Chemotherapy   Patient is on Treatment Plan : BREAST TC q21d       ALLERGIES:  is allergic to sulfa antibiotics.  MEDICATIONS:  Current Outpatient Medications  Medication Sig Dispense Refill   albuterol (VENTOLIN HFA) 108 (90 Base) MCG/ACT inhaler Inhale 2 puffs into the lungs every 6 (six) hours as needed for wheezing or shortness of breath. 8 g 2   anastrozole (ARIMIDEX) 1 MG tablet Take 1 tablet (1 mg total) by mouth daily. 30 tablet 4   apixaban (ELIQUIS) 5 MG TABS tablet Two tabs po twice a day  for five days then 1 tab po twice a day afterwards 60 tablet 4   atorvastatin (LIPITOR) 10 MG tablet Take 1 tablet (10 mg total) by mouth daily. 90 tablet 1   Calcium Carbonate-Vit D-Min (CALCIUM 1200 PO) Take 1 tablet by mouth daily.     guaiFENesin (MUCINEX) 600 MG 12 hr tablet Take 1 tablet (600 mg total) by mouth 2 (two) times daily as needed for cough or to loosen phlegm. 10 tablet 0   niacinamide 500 MG tablet Take 500 mg by mouth 2 (two) times daily.     Omega-3 Fatty Acids (FISH OIL PO) Take 1 capsule by mouth daily.     umeclidinium bromide (INCRUSE ELLIPTA) 62.5 MCG/ACT AEPB Inhale 1 puff into the lungs daily. 30 each 0   VITAMIN D, CHOLECALCIFEROL, PO Take 2,000 Units by mouth daily.     No current facility-administered medications for this visit.    VITAL SIGNS: There were no vitals taken for this visit. There were no vitals filed for this visit.  Estimated body mass index is 28.65 kg/m as calculated from the following:   Height as of an earlier encounter on 09/21/22: 5' 6.5" (1.689 m).   Weight as of an earlier encounter on 09/21/22: 81.7 kg (180 lb 3.2 oz).  LABS: CBC:    Component Value Date/Time   WBC 5.8 08/10/2022 1416   WBC 5.8 07/14/2022 1505   HGB 12.1 08/10/2022 1416   HGB 12.4 05/15/2013 1445   HCT 37.5 08/10/2022 1416   HCT 38.2 05/15/2013 1445   PLT 200 08/10/2022 1416   PLT 255 05/15/2013 1445   MCV 101.6 (H) 08/10/2022 1416   MCV 96 05/15/2013 1445   NEUTROABS 3.7 08/10/2022 1416   NEUTROABS 3.9 05/15/2013 1445   LYMPHSABS 1.1 08/10/2022 1416   LYMPHSABS 2.0 05/15/2013 1445   MONOABS 0.6 08/10/2022 1416   MONOABS 0.6 05/15/2013 1445   EOSABS 0.4 08/10/2022 1416   EOSABS 0.4 05/15/2013 1445   BASOSABS 0.1 08/10/2022 1416   BASOSABS 0.1 05/15/2013 1445   Comprehensive Metabolic Panel:    Component Value Date/Time   NA 136 08/10/2022 1416   NA 133 (L) 07/15/2012 2105   K 3.6 08/10/2022 1416   K 3.8 07/18/2012 0503   CL 102 08/10/2022 1416    CL 100 07/15/2012 2105  CO2 26 08/10/2022 1416   CO2 28 07/15/2012 2105   BUN 19 08/10/2022 1416   BUN 10 07/15/2012 2105   CREATININE 0.88 08/10/2022 1416   CREATININE 0.68 07/15/2012 2105   GLUCOSE 136 (H) 08/10/2022 1416   GLUCOSE 136 (H) 07/15/2012 2105   CALCIUM 9.8 08/10/2022 1416   CALCIUM 8.4 (L) 07/15/2012 2105   AST 24 08/10/2022 1416   ALT 15 08/10/2022 1416   ALT 24 06/22/2011 1323   ALKPHOS 64 08/10/2022 1416   ALKPHOS 72 06/22/2011 1323   BILITOT 0.5 08/10/2022 1416   PROT 6.7 08/10/2022 1416   PROT 7.5 06/22/2011 1323   ALBUMIN 4.1 08/10/2022 1416   ALBUMIN 3.8 06/22/2011 1323     Present during today's visit: patient only  Start plan: Pending MD determination, patient will return to clinic prior to starting   Patient Education I spoke with patient for overview of new oral chemotherapy medication: abemaciclib   CMP from 08/10/22 assessed, no relevant lab abnormalities. Prescription dose and frequency assessed.   Administration: Counseled patient on administration, dosing, side effects, monitoring, drug-food interactions, safe handling, storage, and disposal. Patient will take 1 tablet (100 mg total) by mouth 2 (two) times daily.  Side Effects: Side effects include but not limited to: nausea, diarrhea, fatigue, decreased wbc/hgb/plt.    Drug-drug Interactions (DDI): No current DDIs with abemaciclib  Adherence: After discussion with patient no patient barriers to medication adherence identified.  Reviewed with patient importance of keeping a medication schedule and plan for any missed doses.  Ms. Fetterman voiced understanding and appreciation. All questions answered. Medication handout provided.  Provided patient with Oral Chemotherapy Navigation Clinic phone number. Patient knows to call the office with questions or concerns. Oral Chemotherapy Navigation Clinic will continue to follow.  Patient expressed understanding and was in agreement with this plan.  She also understands that She can call clinic at any time with any questions, concerns, or complaints.   Medication Access Issues: PA pending for abemaciclib  Follow-up plan: RTC pending medication access  Thank you for allowing me to participate in the care of this patient.   Time Total: 20 mins  Visit consisted of counseling and education on dealing with issues of symptom management in the setting of serious and potentially life-threatening illness.Greater than 50%  of this time was spent counseling and coordinating care related to the above assessment and plan.  Signed by: Remi Haggard, PharmD, BCPS, Nolon Bussing, CPP Hematology/Oncology Clinical Pharmacist Practitioner [place]/DB/AP Oral Chemotherapy Navigation Clinic [phone]  09/21/2022 4:41 PM

## 2022-09-21 NOTE — Progress Notes (Signed)
[facility] CONSULT NOTE  Patient Care Team: [name], MD as PCP - General (Family Medicine) [name], MD as PCP - Cardiology (Cardiology) [name], [name], MD as Consulting Physician (General Surgery) [name], MD (Internal Medicine) [name], RN as Oncology Nurse Navigator [name], MD as Consulting Physician (Radiation Oncology) [name], MD as Consulting Physician (Internal Medicine) Sung Amabile, DO as Consulting Physician (General Surgery)  CHIEF COMPLAINTS/PURPOSE OF CONSULTATION: Breast cancer  #  Oncology History Overview Note  # 2009- Sigmoid colon cancer stage II (T3 N0 4 lymph nodes sampled M0 stage II high risk there was obstruction and perforation abscess; Dr.Hern), workup included a low CEA preoperatively and a chest x-ray and CT scan abdomen pelvis negative for metastatic disease, S/P colostomy; s/p FOLFOX x 6 months. [Dr.Gittin ]  # LATER REVERSED , K RAS  wild type B RAF not evaluated   Also initial iron deficiency with anemia thrombocytosis, high platelets considered reactive, workup includes a normal PCR for CML and a negative [id number] mutation  DIAGNOSIS:  A. BREAST, RIGHT; LUMPECTOMY:  - INVASIVE MAMMARY CARCINOMA.  - DUCTAL CARCINOMA IN SITU (DCIS).  - SEE CANCER SUMMARY BELOW.  - BIOPSY SITE CHANGE WITH CLIP (2).  - FIBROUS TISSUE WITH CYST FORMATION AND FLORID DUCTAL HYPERPLASIA.  - SKIN AND NIPPLE NEGATIVE FOR MALIGNANCY.  - VASCULAR CALCIFICATION.   B. SENTINEL LYMPH NODES 1 AND 2, RIGHT AXILLA; EXCISION:  - METASTATIC CARCINOMA INVOLVES ONE OF TWO LYMPH NODES (1/2).   C. NON-SENTINEL LYMPH NODES, RIGHT AXILLA; EXCISION:  - ONE LYMPH NODE NEGATIVE FOR MALIGNANCY (0/1).   CANCER CASE SUMMARY: INVASIVE CARCINOMA OF THE BREAST  Standard(s): AJCC-UICC 8   SPECIMEN  Procedure: Lumpectomy  Specimen Laterality: Right   TUMOR  Histologic Type: Invasive carcinoma of no special type  (ductal)  Histologic Grade (Nottingham Histologic Score)       Glandular (Acinar)/Tubular Differentiation: 3       Nuclear Pleomorphism: 3       Mitotic Rate: 3       Overall Grade: 3  Tumor Size: 24 mm  Tumor Focality: Single focus of invasive carcinoma  Ductal Carcinoma In Situ (DCIS): Present, nuclear grade 2-3  Tumor Extent: Not applicable  Lymphatic and/or Vascular Invasion: Present  Treatment Effect in the Breast: No known presurgical therapy   MARGINS  Margin Status for Invasive Carcinoma: All margins negative for invasive  carcinoma       Distance from closest margin: 10 mm       Specify closest margin: Superior   Margin Status for DCIS: All margins negative for DCIS       Distance from DCIS to closest margin: 10 mm       Specify closest margin: Superior   REGIONAL LYMPH NODES  Regional Lymph Node Status: Tumor present in regional lymph node(s)       Number of Lymph Nodes with Macrometastases (greater than 2 mm): 1       Number of Lymph Nodes with Micrometastases (greater than 0.2 mm to  2 mm and/or greater than 200 cells): 0       Number of Lymph Nodes with Isolated Tumor Cells (0.2 mm or less OR  200 cells or less): 0       Size of Largest Metastatic Deposit: 3 mm      Extranodal Extension: Not identified       Total Number of Lymph Nodes Examined (sentinel and non-sentinel): 3  Number of Sentinel Nodes Examined: 2   DISTANT METASTASIS  Distant Site(s) Involved, if applicable: Not applicable   PATHOLOGIC STAGE CLASSIFICATION (pTNM, AJCC 8th Edition):  Modified Classification: Not applicable  pT Category: pT2  T Suffix: Not applicable  pN Category: pN1a  N Suffix: (sn)  pM Category: Not applicable   SPECIAL STUDIES  Breast Biomarker Testing Performed on Previous Outside Biopsy:  23-250-T11  Per outside report:  Estrogen Receptor (ER) Status: POSITIVE          Percentage of cells with nuclear positivity: Greater than 90%          Average intensity of  staining: Strong   Progesterone Receptor (PgR) Status: POSITIVE          Percentage of cells with nuclear positivity: 60%          Average intensity of staining: Strong   HER2 (by immunohistochemistry): EQUIVOCAL (Score 2+)  HER2 FISH: NEGATIVE   Ki-67: Not performed   # Cytoxan Taxotere x 4 cycles; finished JAN, 2024. [Neutropenic fevers s/p cycle #4;  -FEB 2nd, 2024-right lung PE; left lower LE  DVT- on eliquis.   # FEB 19th-start RT  IMPRESSION: 1. Complex cystic and solid-appearing mass within the subareolar RIGHT breast, overall measuring 3.1 cm greatest dimension, the solid enhancing component at the anterior-lateral aspect of the mass measuring 2 x 1 cm, abutting the overlying skin but without evidence of involvement/enhancement of the skin. Susceptibility artifact within the mass may represent a biopsy clip (if biopsy has been performed at an outside site since the diagnostic mammogram/ultrasound of 12/28/2021) or this artifact could represent calcifications within the mass. This mass corresponds to the recent ultrasound finding of a complex irregular mass at the 11 o'clock axis of the retroareolar RIGHT breast. 2. No evidence of multifocal or multicentric disease within the RIGHT breast. 3. No evidence of contralateral disease in the LEFT breast.   RECOMMENDATION: If has not already been performed, recommend ultrasound-guided biopsy of the suspicious mass in the subareolar RIGHT breast, with preferential targeting of the anterior-lateral component of the mass which is shown to be the enhancing component on today's MRI.   BI-RADS CATEGORY  4: Suspicious.     Cancer of sigmoid (HCC) (Resolved)  Carcinoma of overlapping sites of right breast in female, estrogen receptor positive (HCC)  02/14/2022 Initial Diagnosis   Carcinoma of overlapping sites of right breast in female, estrogen receptor positive (HCC)   02/14/2022 Cancer Staging   Staging form: Breast, AJCC  8th Edition - Pathologic: Stage IIA (pT2, pN1, cM0, G3, ER+, PR+, HER2-) - Signed by [name], MD on 02/14/2022 Histologic grading system: 3 grade system   02/23/2022 -  Chemotherapy   Patient is on Treatment Plan : BREAST TC q21d      HISTORY OF PRESENTING ILLNESS:   Alone.  Ambulating independently.   [name] 82 y.o.  female patient with ER/PR positive Her 2 Negative  breast cancer stage II T2 N1-currently s/p adjuvant chemotherapy; currently s/p adjuvant radiation; DVT PE on Eliquis is here for follow-up.  Patient currently on adjuvant anastrozole.  S/p evaluation with lymphedema occupational therapist.   Would like to know when she could r/s her eye surgery?  Her leg swelling is improved overall. Denies any worsening shortness of breath or cough.  Back pain improved on Tylenol.  Denies any blood in the black or stools.  Review of Systems  Constitutional:  Negative for chills, diaphoresis, fever, malaise/fatigue and weight loss.  HENT:  Negative for nosebleeds and sore throat.   Eyes:  Negative for double vision.  Respiratory:  Negative for cough, hemoptysis, sputum production, shortness of breath and wheezing.   Cardiovascular:  Negative for chest pain, palpitations, orthopnea and leg swelling.  Gastrointestinal:  Negative for abdominal pain, blood in stool, constipation, diarrhea, heartburn, melena, nausea and vomiting.  Genitourinary:  Negative for dysuria, frequency and urgency.  Musculoskeletal:  Negative for back pain and joint pain.  Skin: Negative.  Negative for itching and rash.  Neurological:  Negative for dizziness, tingling, focal weakness, weakness and headaches.  Endo/Heme/Allergies:  Does not bruise/bleed easily.  Psychiatric/Behavioral:  Negative for depression. The patient is not nervous/anxious and does not have insomnia.      MEDICAL HISTORY:  Past Medical History:  Diagnosis Date   Actinic keratosis 02/12/2020   R lat calf  (hypertrophic)   Arthritis    Cancer of sigmoid (HCC) 06/17/2016   Colon cancer (HCC) 2009   T3,N0; s/p resection  Dr. Earnestine Leys and chemotherapy by Dr. Neale Burly   Diastolic dysfunction    a. 08/2020 Echo: EF 60-65%, no rwma, Gr1 DD, nl RV size/fxn. Mild MR.   Hernia of flank    History of actinic keratoses 08/11/2020   Bx proven at left lateral ankle proximal, LN2 10/08/20   History of stress test    a. 09/2020 MV: EF >65%. No ischemia/infarct. Mild Ao Ca2+ and minimal Cor Ca2+.   Hypertension    Neuropathy    Polyp at cervical os    s/p resection Dr. Hyacinth Meeker   Skin cancer 01/22/2020   surface of an atypical verrucous squamous proliferation    Squamous cell carcinoma of skin 07/16/2020   Left lateral ankle anterior, treated with [facility] 08-11-2020    SURGICAL HISTORY: Past Surgical History:  Procedure Laterality Date   BREAST BIOPSY Right 05/2014   Dr. Rutherford Nail office-benign   BREAST LUMPECTOMY WITH SENTINEL LYMPH NODE BIOPSY Right 01/21/2022   Procedure: BREAST LUMPECTOMY WITH SENTINEL LYMPH NODE BX;  Surgeon: [name], MD;  Location: [facility] ORS;  Service: General;  Laterality: Right;   BREAST SURGERY Right February 2016   Vacuum assisted biopsy for recurrent cyst, fibrocystic changes without evidence of malignancy.   CATARACT EXTRACTION W/PHACO Left 01/13/2015   Procedure: CATARACT EXTRACTION PHACO AND INTRAOCULAR LENS PLACEMENT (IOC);  Surgeon: Galen Manila, MD;  Location: [facility] ORS;  Service: Ophthalmology;  Laterality: Left;  [place]  1:02.9 AP   19.2 CDE  12.06 casette lot #  [id number]   CATARACT EXTRACTION W/PHACO Right 02/03/2015   Procedure: CATARACT EXTRACTION PHACO AND INTRAOCULAR LENS PLACEMENT (IOC);  Surgeon: Galen Manila, MD;  Location: [facility] ORS;  Service: Ophthalmology;  Laterality: Right;  us00:53 ap46.5 cde10.79   COLECTOMY     COLONOSCOPY  2015   Dr Bluford Kaufmann   COLONOSCOPY WITH PROPOFOL N/A 09/13/2017   Procedure: COLONOSCOPY WITH PROPOFOL;  Surgeon: [name], MD;  Location: [facility];  Service: Endoscopy;  Laterality: N/A;   COLONOSCOPY WITH PROPOFOL N/A 11/08/2017   Procedure: COLONOSCOPY WITH PROPOFOL;  Surgeon: [name], MD;  Location: [facility] ENDOSCOPY;  Service: Endoscopy;  Laterality: N/A;   colostomy reversal     DILATION AND CURETTAGE OF UTERUS  2014   Dr. Hyacinth Meeker, benign per pt   EYE SURGERY     HERNIA REPAIR  07/13/12   ventral    SKIN CANCER EXCISION     TONSILLECTOMY      SOCIAL HISTORY: Social History  Socioeconomic History   Marital status: Married    Spouse name: Not on file   Number of children: Not on file   Years of education: Not on file   Highest education level: Not on file  Occupational History   Not on file  Tobacco Use   Smoking status: Former    Types: Cigarettes    Quit date: 10/12/2007    Years since quitting: 14.9   Smokeless tobacco: Never  Vaping Use   Vaping Use: Never used  Substance and Sexual Activity   Alcohol use: Yes    Alcohol/week: 1.0 standard drink of alcohol    Types: 1 Standard drinks or equivalent per week    Comment: with dinner GLASS OF WINE EACH DAY   Drug use: No   Sexual activity: Not on file  Other Topics Concern   Not on file  Social History Narrative   Lives in [place] with husband. 2 children, son lives nearby.Diet - regularExercise - none. 30 mins/in [place]. Quit smoking in 2009. Wine before dinner. Pianist in church; real estate; Diplomatic Services operational officer.    Social Determinants of Health   Financial Resource Strain: Low Risk  (05/19/2022)   Overall Financial Resource Strain (CARDIA)    Difficulty of Paying Living Expenses: Not very hard  Food Insecurity: No Food Insecurity (06/03/2022)   Hunger Vital Sign    Worried About Running Out of Food in the Last Year: Never true    Ran Out of Food in the Last Year: Never true  Transportation Needs: No Transportation Needs (05/28/2022)   PRAPARE - Administrator, Civil Service (Medical): No    Lack of  Transportation (Non-Medical): No  Physical Activity: Not on file  Stress: No Stress Concern Present (11/16/2020)   [employer] of Occupational Health - Occupational Stress Questionnaire    Feeling of Stress : Not at all  Social Connections: Not on file  Intimate Partner Violence: Not At Risk (05/28/2022)   Humiliation, Afraid, Rape, and Kick questionnaire    Fear of Current or Ex-Partner: No    Emotionally Abused: No    Physically Abused: No    Sexually Abused: No    FAMILY HISTORY: Family History  Problem Relation Age of Onset   Stroke Mother    Diabetes Mother    Colon cancer Father        dx 19s   Stroke Sister    Colon cancer Brother        dx 64s-70s   Brain cancer Brother        dx 60s-70s    ALLERGIES:  is allergic to sulfa antibiotics.  MEDICATIONS:  Current Outpatient Medications  Medication Sig Dispense Refill   albuterol (VENTOLIN HFA) 108 (90 Base) MCG/ACT inhaler Inhale 2 puffs into the lungs every 6 (six) hours as needed for wheezing or shortness of breath. 8 g 2   anastrozole (ARIMIDEX) 1 MG tablet Take 1 tablet (1 mg total) by mouth daily. 30 tablet 4   apixaban (ELIQUIS) 5 MG TABS tablet Two tabs po twice a day for five days then 1 tab po twice a day afterwards 60 tablet 4   atorvastatin (LIPITOR) 10 MG tablet Take 1 tablet (10 mg total) by mouth daily. 90 tablet 1   Calcium Carbonate-Vit D-Min (CALCIUM 1200 PO) Take 1 tablet by mouth daily.     guaiFENesin (MUCINEX) 600 MG 12 hr tablet Take 1 tablet (600 mg total) by mouth 2 (two) times daily as needed for  cough or to loosen phlegm. 10 tablet 0   niacinamide 500 MG tablet Take 500 mg by mouth 2 (two) times daily.     Omega-3 Fatty Acids (FISH OIL PO) Take 1 capsule by mouth daily.     umeclidinium bromide (INCRUSE ELLIPTA) 62.5 MCG/ACT AEPB Inhale 1 puff into the lungs daily. 30 each 0   VITAMIN D, CHOLECALCIFEROL, PO Take 2,000 Units by mouth daily.     No current facility-administered medications for  this visit.      Marland Kitchen  PHYSICAL EXAMINATION:   Vitals:   09/21/22 1509  BP: (!) 169/82  Pulse: 79  Temp: 97.9 F (36.6 C)  SpO2: 97%    Filed Weights   09/21/22 1509  Weight: 180 lb 3.2 oz (81.7 kg)     Physical Exam Vitals and nursing note reviewed.  HENT:     Head: Normocephalic and atraumatic.     Mouth/Throat:     Pharynx: Oropharynx is clear.  Eyes:     Extraocular Movements: Extraocular movements intact.     Pupils: Pupils are equal, round, and reactive to light.  Cardiovascular:     Rate and Rhythm: Normal rate and regular rhythm.  Pulmonary:     Comments: Decreased breath sounds bilaterally.  Abdominal:     Palpations: Abdomen is soft.  Musculoskeletal:        General: Normal range of motion.     Cervical back: Normal range of motion.  Skin:    General: Skin is warm.  Neurological:     General: No focal deficit present.     Mental Status: She is alert and oriented to person, place, and time.  Psychiatric:        Behavior: Behavior normal.        Judgment: Judgment normal.      LABORATORY DATA:  I have reviewed the data as listed Lab Results  Component Value Date   WBC 5.8 08/10/2022   HGB 12.1 08/10/2022   HCT 37.5 08/10/2022   MCV 101.6 (H) 08/10/2022   PLT 200 08/10/2022   Recent Labs    05/29/22 0929 05/30/22 0606 05/31/22 0410 06/03/22 1116 06/08/22 1031 08/10/22 1416  NA 128*   < > 129* 135 135 136  K 3.8   < > 4.0 4.2 4.4 3.6  CL 95*   < > 96* 98 100 102  CO2 25   < > 24 26 27 26   GLUCOSE 108*   < > 225* 150* 113* 136*  BUN 14   < > 16 26* 15 19  CREATININE 0.69   < > 0.75 0.85 0.76 0.88  CALCIUM 9.0   < > 9.3 9.9 9.7 9.8  GFRNONAA >60   < > >60  --  >60 >60  PROT 5.7*  --   --   --  6.2* 6.7  ALBUMIN 3.0*  --   --   --  3.3* 4.1  AST 12*  --   --   --  18 24  ALT 14  --   --   --  26 15  ALKPHOS 47  --   --   --  57 64  BILITOT 1.1  --   --   --  0.6 0.5   < > = values in this interval not displayed.    RADIOGRAPHIC  STUDIES: I have personally reviewed the radiological images as listed and agreed with the findings in the report. No results found.  ASSESSMENT & PLAN:  Carcinoma of overlapping sites of right breast in female, estrogen receptor positive (HCC) # Right breast cancer -[facility]; ER/PR positive Her 2 Negative ;  G-3; LVI + T2N1.  Stage II [OCT 2023; Dr.Byrnett] s/p Lumpectomy; node positive-Oncotype RS- 35- s/p  adjuvant chemotherapy with Taxotere-Cytoxan every 3 weeks x4 cycles. Consider abema x 2 years/zometa.   S/p postlumpectomy radiation April 3rd 2024.    # Currently on anastrozole once a day [April 2024]-tolerating well without any major side effects.  Continue for total of 5 to 10 years.  Also discussed regarding use of adjuvant abemaciclib-CDK inhibitor for her history of high risk breast cancer T2 N1.  Also discussed regarding the potential side effects including but not limited to diarrhea nausea vomiting/low blood counts risk of infections etc.  Patient interested.  Discussed with pharmacy regarding co-pay assistance program.  Patient will need to follow-up with MD/APP prior to starting treatment.  # [2nd,FEB 2024] CTA-  acute pulmonary embolism, with new segmental to subsegmental embolus present in the right lower lobe/right lower lobe-pulmonary infarct. LEFT LE- Acute DVT-on anticoagulation with eliquis on 5 mg BID X6 months- [provoked sec to chemo]. Stop eliquis in End of July/2024.  Patient would be cleared to have surgery for the eyes after coming off her anticoagulation.   # Bone health-2023 osteopenic T-score of -1.1.Continue calcium plus vitamin D.- stable.  I discussed the role of adjuvant Zometa every 6 months x 3 years. Awaiting dental evaluation [[facility] vs others- will send referral if needed].  Continue ca+Vit D BID.   # Genetic testing: brother-& father- colon cancer; other brother- brain tumor; no breast cancers in family. pending genetic blood work today.   # Right arm decreased  range of motion/swelling- ?  Lymphedema- s/p evaluation- Maureen/OT- stable.   # IV Access: PIV   # DISPOSITION: # follow in 8 weeks-  MD;labs- cbc/cmp; Dr.B  All questions were answered. The patient/family knows to call the clinic with any problems, questions or concerns.    [name], MD 09/21/2022 4:22 PM

## 2022-09-22 ENCOUNTER — Telehealth: Payer: Self-pay

## 2022-09-22 ENCOUNTER — Other Ambulatory Visit (HOSPITAL_COMMUNITY): Payer: Self-pay

## 2022-09-22 MED ORDER — ABEMACICLIB 100 MG PO TABS
100.0000 mg | ORAL_TABLET | Freq: Two times a day (BID) | ORAL | 0 refills | Status: DC
Start: 2022-09-22 — End: 2022-09-27

## 2022-09-22 NOTE — Telephone Encounter (Signed)
Oral Oncology Patient Advocate Encounter  New authorization   Received notification that prior authorization for Verzenio is required.   PA submitted on 09/22/22  Key [id number]  Status is pending     Ardeen Fillers, CPhT Oncology Pharmacy Patient Advocate  [facility]  [phone] (phone) [phone] (fax) 09/22/2022 9:07 AM

## 2022-09-22 NOTE — Telephone Encounter (Signed)
Oral Oncology Patient Advocate Encounter  Received notification that the request for prior authorization for Verzenio has been denied due to missing Ki-67 Score.      Ardeen Fillers, CPhT Oncology Pharmacy Patient Advocate  [facility]  [phone] (phone) [phone] (fax) 09/22/2022 11:37 AM

## 2022-09-22 NOTE — Telephone Encounter (Signed)
Oral Oncology Patient Advocate Encounter   An urgent appeal for the prior authorization denial of Verzenio has been started by the pharmacist on 09/22/22.   This encounter will continue to be updated until final appeal determination.      Ardeen Fillers, CPhT Oncology Pharmacy Patient Advocate  [facility]  [phone] (phone) [phone] (fax) 09/22/2022 11:37 AM

## 2022-09-23 DIAGNOSIS — C50811 Malignant neoplasm of overlapping sites of right female breast: Secondary | ICD-10-CM

## 2022-09-23 NOTE — Progress Notes (Signed)
Survivorship Care Plan visit completed.  Treatment summary reviewed and given to patient.  ASCO answers booklet reviewed and given to patient.  CARE program and Cancer Transitions discussed with patient along with other resources cancer center offers to patients and caregivers.  Patient verbalized understanding.    

## 2022-09-26 ENCOUNTER — Telehealth: Payer: Self-pay

## 2022-09-26 ENCOUNTER — Other Ambulatory Visit (HOSPITAL_COMMUNITY): Payer: Self-pay

## 2022-09-26 DIAGNOSIS — C50811 Malignant neoplasm of overlapping sites of right female breast: Secondary | ICD-10-CM

## 2022-09-26 NOTE — Telephone Encounter (Addendum)
Oral Oncology Patient Advocate Encounter  Prior Authorization for Kathlen Mody has been approved.    PA# [id number]  Effective dates: 09/22/22 through 09/25/23  Patients co-pay is $2,783.30.   PAP initiated. See additional encounter.   Ardeen Fillers, CPhT Oncology Pharmacy Patient Advocate  [facility]  [phone] (phone) [phone] (fax) 09/26/2022 10:26 AM

## 2022-09-26 NOTE — Telephone Encounter (Signed)
Patient would like to come in to the office to sign application Tuesday, 09/27/22, between 10am and 11am. I will meet patient in office to obtain signatures and submit application for Manufacturer Assistance. I will continue to follow and update until final determination.    Ardeen Fillers, CPhT Oncology Pharmacy Patient Advocate  [facility]  [phone] (phone) [phone] (fax) 09/26/2022 3:12 PM

## 2022-09-26 NOTE — Telephone Encounter (Signed)
Oral Oncology Patient Advocate Encounter   Began application for assistance for Verzenio through [employer]. Patient Assistance Program.   Application will be submitted upon completion of necessary supporting documentation.   Antony Odea' phone number [phone].   I will continue to check the status until final determination.    Ardeen Fillers, CPhT Oncology Pharmacy Patient Advocate  [facility]  [phone] (phone) [phone] (fax) 09/26/2022 10:56 AM

## 2022-09-27 ENCOUNTER — Encounter: Payer: Self-pay | Admitting: Internal Medicine

## 2022-09-27 ENCOUNTER — Ambulatory Visit: Payer: Medicare Other | Admitting: Dermatology

## 2022-09-27 ENCOUNTER — Other Ambulatory Visit (HOSPITAL_COMMUNITY): Payer: Self-pay

## 2022-09-27 ENCOUNTER — Encounter: Payer: Self-pay | Admitting: Dermatology

## 2022-09-27 VITALS — BP 127/77 | HR 75

## 2022-09-27 DIAGNOSIS — L820 Inflamed seborrheic keratosis: Secondary | ICD-10-CM | POA: Diagnosis not present

## 2022-09-27 DIAGNOSIS — L821 Other seborrheic keratosis: Secondary | ICD-10-CM

## 2022-09-27 DIAGNOSIS — L601 Onycholysis: Secondary | ICD-10-CM | POA: Diagnosis not present

## 2022-09-27 DIAGNOSIS — L300 Nummular dermatitis: Secondary | ICD-10-CM | POA: Diagnosis not present

## 2022-09-27 DIAGNOSIS — L578 Other skin changes due to chronic exposure to nonionizing radiation: Secondary | ICD-10-CM

## 2022-09-27 DIAGNOSIS — W908XXA Exposure to other nonionizing radiation, initial encounter: Secondary | ICD-10-CM

## 2022-09-27 DIAGNOSIS — L570 Actinic keratosis: Secondary | ICD-10-CM | POA: Diagnosis not present

## 2022-09-27 DIAGNOSIS — Z86007 Personal history of in-situ neoplasm of skin: Secondary | ICD-10-CM

## 2022-09-27 DIAGNOSIS — X32XXXA Exposure to sunlight, initial encounter: Secondary | ICD-10-CM

## 2022-09-27 MED ORDER — KETOCONAZOLE 2 % EX CREA
1.0000 | TOPICAL_CREAM | Freq: Two times a day (BID) | CUTANEOUS | 0 refills | Status: AC
Start: 2022-09-27 — End: 2022-11-08

## 2022-09-27 MED ORDER — HALOBETASOL PROPIONATE 0.05 % EX CREA
TOPICAL_CREAM | CUTANEOUS | 1 refills | Status: AC
Start: 2022-09-27 — End: ?

## 2022-09-27 MED ORDER — TACROLIMUS 0.1 % EX OINT: TOPICAL_OINTMENT | CUTANEOUS | 1 refills | Status: DC

## 2022-09-27 MED ORDER — ABEMACICLIB 100 MG PO TABS
100.0000 mg | ORAL_TABLET | Freq: Two times a day (BID) | ORAL | 0 refills | Status: DC
  Filled 2022-09-27 – 2022-09-28 (×2): qty 56, 28d supply, fill #0

## 2022-09-27 NOTE — Telephone Encounter (Signed)
Oral Oncology Patient Advocate Encounter  Funding opened while in process of PAP. PAP cancelled and grant funding obtained.   Was successful in securing patient a $4,000.00 grant from [employer] to provide copayment coverage for Verzenio.  This will keep the out of pocket expense at $0.      The billing information is as follows and has been shared with [facility].   Member ID: [id number] Group ID: [id number] RxBin: [id number] PCN: [id number] Dates of Eligibility: 09/27/22 through 09/27/23  Fund name:  Breast Cancer   Ardeen Fillers, CPhT Oncology Pharmacy Patient Advocate  [facility]  [phone] (phone) [phone] (fax) 09/27/2022 2:10 PM

## 2022-09-27 NOTE — Progress Notes (Unsigned)
Follow-Up Visit   Subjective  Sabrina Wilson is a 82 y.o. female who presents for the following: recheck spot at left leg, hx of sccis   The following portions of the chart were reviewed this encounter and updated as appropriate: medications, allergies, medical history  Review of Systems:  No other skin or systemic complaints except as noted in HPI or Assessment and Plan.  Objective  Well appearing patient in no apparent distress; mood and affect are within normal limits.   A focused examination was performed of the following areas: Left leg  Relevant exam findings are noted in the Assessment and Plan.  left lateral ankle x 2, right pretibia x 1 (3) Erythematous thin papules/macules with gritty scale.   Left Lower Leg - Anterior Erythematous stuck-on, waxy papule or plaque    Assessment & Plan   NUMMULAR DERMATITIS  Exam: Scaly pink nummular plaques  Chronic and persistent condition with duration over one year. Condition is symptomatic/ bothersome to patient. Not currently at goal.  Treatment Plan: She does well with opzelura but it's not covered. Clobetasol cream not clearing spots well.  Stop clobetasol cream  Start Tacrolimus twice a day 5 days a week Start Halobetasol ointment on weekened twice a day at affected areas. Topical steroids (such as triamcinolone, fluocinolone, fluocinonide, mometasone, clobetasol, halobetasol, betamethasone, hydrocortisone) can cause thinning and lightening of the skin if they are used for too long in the same area. Your physician has selected the right strength medicine for your problem and area affected on the body. Please use your medication only as directed by your physician to prevent side effects.    Topical steroids (such as triamcinolone, fluocinolone, fluocinonide, mometasone, clobetasol, halobetasol, betamethasone, hydrocortisone) can cause thinning and lightening of the skin if they are used for too long in the same area.  Your physician has selected the right strength medicine for your problem and area affected on the body. Please use your medication only as directed by your physician to prevent side effects.   Gentle skin care recommended.   Onycholysis  Exam: onycholysis of fingernails  Treatment Plan: Recommend trimming white area of nail Keep nails dry. Recommend gloves for wet work. Start ketoconazole 2 % cream - apply to fingernails and surrounding skin bid.    SEBORRHEIC KERATOSIS - Stuck-on, waxy, tan-brown papules and/or plaques  - Benign-appearing - Discussed benign etiology and prognosis. - Observe - Call for any changes   ACTINIC DAMAGE - chronic, secondary to cumulative UV radiation exposure/sun exposure over time - diffuse scaly erythematous macules with underlying dyspigmentation - Recommend daily broad spectrum sunscreen SPF 30+ to sun-exposed areas, reapply every 2 hours as needed.  - Recommend staying in the shade or wearing long sleeves, sun glasses (UVA+UVB protection) and wide brim hats (4-inch brim around the entire circumference of the hat). - Call for new or changing lesions.   Actinic keratosis (3) left lateral ankle x 2, right pretibia x 1  Actinic keratoses are precancerous spots that appear secondary to cumulative UV radiation exposure/sun exposure over time. They are chronic with expected duration over 1 year. A portion of actinic keratoses will progress to squamous cell carcinoma of the skin. It is not possible to reliably predict which spots will progress to skin cancer and so treatment is recommended to prevent development of skin cancer.  Recommend daily broad spectrum sunscreen SPF 30+ to sun-exposed areas, reapply every 2 hours as needed.  Recommend staying in the shade or wearing long sleeves, sun  glasses (UVA+UVB protection) and wide brim hats (4-inch brim around the entire circumference of the hat). Call for new or changing lesions.  Destruction of lesion -  left lateral ankle x 2, right pretibia x 1  Destruction method: cryotherapy   Informed consent: discussed and consent obtained   Lesion destroyed using liquid nitrogen: Yes   Cryotherapy cycles:  2 Outcome: patient tolerated procedure well with no complications   Post-procedure details: wound care instructions given   Additional details:  Prior to procedure, discussed risks of blister formation, small wound, skin dyspigmentation, or rare scar following cryotherapy. Recommend Vaseline ointment to treated areas while healing.   Inflamed seborrheic keratosis Left Lower Leg - Anterior  Symptomatic, irritating, patient would like treated.  Benign-appearing.  Call clinic for new or changing lesions.   Prior to procedure, discussed risks of blister formation, small wound, skin dyspigmentation, or rare scar following treatment. Recommend Vaseline ointment to treated areas while healing.  Destruction of lesion - Left Lower Leg - Anterior  Destruction method: cryotherapy   Informed consent: discussed and consent obtained   Lesion destroyed using liquid nitrogen: Yes   Cryotherapy cycles:  2 Outcome: patient tolerated procedure well with no complications   Post-procedure details: wound care instructions given   Additional details:  Prior to procedure, discussed risks of blister formation, small wound, skin dyspigmentation, or rare scar following cryotherapy. Recommend Vaseline ointment to treated areas while healing.   History of Squamous Cell Carcinoma of the Skin. Left lateral ankle. EDC 08/11/2020 - No evidence of recurrence today - Recommend regular full body skin exams - Recommend daily broad spectrum sunscreen SPF 30+ to sun-exposed areas, reapply every 2 hours as needed.  - Call if any new or changing lesions are noted between office visits  Return for tbse recheck aks with Dr. Katrinka Blazing .  I, Asher Muir, CMA, am acting as scribe for Darden Dates, MD.   Documentation: I have  reviewed the above documentation for accuracy and completeness, and I agree with the above.  Darden Dates, MD

## 2022-09-27 NOTE — Patient Instructions (Addendum)
For dermatitis at body  Start tacrolimus ointment - apply twice daily to affected areas of itchy rash on weekdays only.   Start halobetasol ointment - apply twice daily to affected areas of itchy rash on weekends only    For finger nails  Can start Ketoconazole cream apply twice daily to fingernails as needed.     Melanoma ABCDEs  Melanoma is the most dangerous type of skin cancer, and is the leading cause of death from skin disease.  You are more likely to develop melanoma if you: Have light-colored skin, light-colored eyes, or red or blond hair Spend a lot of time in the sun Tan regularly, either outdoors or in a tanning bed Have had blistering sunburns, especially during childhood Have a close family member who has had a melanoma Have atypical moles or large birthmarks  Early detection of melanoma is key since treatment is typically straightforward and cure rates are extremely high if we catch it early.   The first sign of melanoma is often a change in a mole or a new dark spot.  The ABCDE system is a way of remembering the signs of melanoma.  A for asymmetry:  The two halves do not match. B for border:  The edges of the growth are irregular. C for color:  A mixture of colors are present instead of an even brown color. D for diameter:  Melanomas are usually (but not always) greater than 6mm - the size of a pencil eraser. E for evolution:  The spot keeps changing in size, shape, and color.  Please check your skin once per month between visits. You can use a small mirror in front and a large mirror behind you to keep an eye on the back side or your body.   If you see any new or changing lesions before your next follow-up, please call to schedule a visit.  Please continue daily skin protection including broad spectrum sunscreen SPF 30+ to sun-exposed areas, reapplying every 2 hours as needed when you're outdoors.   Staying in the shade or wearing long sleeves, sun glasses  (UVA+UVB protection) and wide brim hats (4-inch brim around the entire circumference of the hat) are also recommended for sun protection.      Actinic keratoses are precancerous spots that appear secondary to cumulative UV radiation exposure/sun exposure over time. They are chronic with expected duration over 1 year. A portion of actinic keratoses will progress to squamous cell carcinoma of the skin. It is not possible to reliably predict which spots will progress to skin cancer and so treatment is recommended to prevent development of skin cancer.  Recommend daily broad spectrum sunscreen SPF 30+ to sun-exposed areas, reapply every 2 hours as needed.  Recommend staying in the shade or wearing long sleeves, sun glasses (UVA+UVB protection) and wide brim hats (4-inch brim around the entire circumference of the hat). Call for new or changing lesions.   Cryotherapy Aftercare  Wash gently with soap and water everyday.   Apply Vaseline and Band-Aid daily until healed.    Seborrheic Keratosis  What causes seborrheic keratoses? Seborrheic keratoses are harmless, common skin growths that first appear during adult life.  As time goes by, more growths appear.  Some people may develop a large number of them.  Seborrheic keratoses appear on both covered and uncovered body parts.  They are not caused by sunlight.  The tendency to develop seborrheic keratoses can be inherited.  They vary in color from skin-colored to gray,  brown, or even black.  They can be either smooth or have a rough, warty surface.   Seborrheic keratoses are superficial and look as if they were stuck on the skin.  Under the microscope this type of keratosis looks like layers upon layers of skin.  That is why at times the top layer may seem to fall off, but the rest of the growth remains and re-grows.    Treatment Seborrheic keratoses do not need to be treated, but can easily be removed in the office.  Seborrheic keratoses often cause  symptoms when they rub on clothing or jewelry.  Lesions can be in the way of shaving.  If they become inflamed, they can cause itching, soreness, or burning.  Removal of a seborrheic keratosis can be accomplished by freezing, burning, or surgery. If any spot bleeds, scabs, or grows rapidly, please return to have it checked, as these can be an indication of a skin cancer.      Due to recent changes in healthcare laws, you may see results of your pathology and/or laboratory studies on MyChart before the doctors have had a chance to review them. We understand that in some cases there may be results that are confusing or concerning to you. Please understand that not all results are received at the same time and often the doctors may need to interpret multiple results in order to provide you with the best plan of care or course of treatment. Therefore, we ask that you please give [place] 2 business days to thoroughly review all your results before contacting the office for clarification. Should we see a critical lab result, you will be contacted sooner.   If You Need Anything After Your Visit  If you have any questions or concerns for your doctor, please call our main line at [phone] and press option 4 to reach your doctor's medical assistant. If no one answers, please leave a voicemail as directed and we will return your call as soon as possible. Messages left after 4 pm will be answered the following business day.   You may also send [place] a message via MyChart. We typically respond to MyChart messages within 1-2 business days.  For prescription refills, please ask your pharmacy to contact our office. Our fax number is [phone].  If you have an urgent issue when the clinic is closed that cannot wait until the next business day, you can page your doctor at the number below.    Please note that while we do our best to be available for urgent issues outside of office hours, we are not available 24/7.    If you have an urgent issue and are unable to reach [place], you may choose to seek medical care at your doctor's office, retail clinic, urgent care center, or emergency room.  If you have a medical emergency, please immediately call 911 or go to the emergency department.  Pager Numbers  - Dr. Gwen Pounds: [phone]  - Dr. Neale Burly: [phone]  - Dr. Roseanne Reno: [phone]  In the event of inclement weather, please call our main line at [phone] for an update on the status of any delays or closures.  Dermatology Medication Tips: Please keep the boxes that topical medications come in in order to help keep track of the instructions about where and how to use these. Pharmacies typically print the medication instructions only on the boxes and not directly on the medication tubes.   If your medication is too expensive, please contact our office  at [phone] option 4 or send [place] a message through MyChart.   We are unable to tell what your co-pay for medications will be in advance as this is different depending on your insurance coverage. However, we may be able to find a substitute medication at lower cost or fill out paperwork to get insurance to cover a needed medication.   If a prior authorization is required to get your medication covered by your insurance company, please allow [place] 1-2 business days to complete this process.  Drug prices often vary depending on where the prescription is filled and some pharmacies may offer cheaper prices.  The website www.goodrx.com contains coupons for medications through different pharmacies. The prices here do not account for what the cost may be with help from insurance (it may be cheaper with your insurance), but the website can give you the price if you did not use any insurance.  - You can print the associated coupon and take it with your prescription to the pharmacy.  - You may also stop by our office during regular business hours and pick up a  GoodRx coupon card.  - If you need your prescription sent electronically to a different pharmacy, notify our office through [facility] or by phone at [phone] option 4.     Si Usted Necesita Algo Despus de Su Visita  Tambin puede enviarnos un mensaje a travs de Clinical cytogeneticist. Por lo general respondemos a los mensajes de MyChart en el transcurso de 1 a 2 das hbiles.  Para renovar recetas, por favor pida a su farmacia que se ponga en contacto con nuestra oficina. Annie Sable de fax es [place] [phone].  Si tiene un asunto urgente cuando la clnica est cerrada y que no puede esperar hasta el siguiente da hbil, puede llamar/localizar a su doctor(a) al nmero que aparece a continuacin.   Por favor, tenga en cuenta que aunque hacemos todo lo posible para estar disponibles para asuntos urgentes fuera del horario de [place], no estamos disponibles las 24 horas del da, los 7 [address]  [address] de la [place].   Si tiene un problema urgente y no puede comunicarse con nosotros, puede optar por buscar atencin mdica  en el consultorio de su doctor(a), en una clnica privada, en un centro de atencin urgente o en una sala de emergencias.  Si tiene Engineer, drilling, por favor llame inmediatamente al 911 o vaya a la sala de emergencias.  Nmeros de bper  - Dr. Gwen Pounds: [phone]  - Dra. Moye: [phone]  - Dra. Roseanne Reno: [phone]  En caso de inclemencias del [place], por favor llame a Lacy Duverney principal al [phone] para una actualizacin sobre el [place] de cualquier retraso o cierre.  Consejos para la medicacin en dermatologa: Por favor, guarde las cajas en las que vienen los medicamentos de uso tpico para ayudarle a seguir las instrucciones sobre dnde y cmo usarlos. Las farmacias generalmente imprimen las instrucciones del medicamento slo en las cajas y no directamente en los tubos del [place].   Si su medicamento es muy caro, por favor, pngase en contacto con  Rolm Gala llamando al [phone] y presione la opcin 4 o envenos un mensaje a travs de Clinical cytogeneticist.   No podemos decirle cul ser su copago por los medicamentos por adelantado ya que esto es diferente dependiendo de la cobertura de su seguro. Sin embargo, es posible que podamos encontrar un medicamento sustituto a Audiological scientist un formulario para que el seguro cubra el medicamento que se  considera necesario.   Si se requiere una autorizacin previa para que su compaa de seguros [place] su medicamento, por favor permtanos de 1 a 2 das hbiles para completar [address]  Los precios de los medicamentos varan con frecuencia dependiendo del Environmental consultant de dnde se surte la receta y alguna farmacias pueden ofrecer precios ms baratos.  El sitio web www.goodrx.com tiene cupones para medicamentos de Health and safety inspector. Los precios aqu no tienen en cuenta lo que podra costar con la ayuda del seguro (puede ser ms barato con su seguro), pero el sitio web puede darle el precio si no utiliz Tourist information centre manager.  - Puede imprimir el cupn correspondiente y llevarlo con su receta a la farmacia.  - Tambin puede pasar por nuestra oficina durante el horario de atencin regular y Education officer, museum una tarjeta de cupones de GoodRx.  - Si necesita que su receta se enve electrnicamente a una farmacia diferente, informe a nuestra oficina a travs de MyChart de [place] o por telfono llamando al [phone] y presione la opcin 4.

## 2022-09-28 ENCOUNTER — Other Ambulatory Visit: Payer: Self-pay

## 2022-09-28 ENCOUNTER — Other Ambulatory Visit (HOSPITAL_COMMUNITY): Payer: Self-pay

## 2022-09-28 ENCOUNTER — Telehealth: Payer: Self-pay

## 2022-09-28 NOTE — Telephone Encounter (Signed)
Patient successfully OnBoarded and drug education provided by pharmacist. Medication scheduled to be shipped on 09/28/22 for delivery on 09/29/22 from [facility] to patient's address. Patient also knows to call me at [phone] with any questions or concerns regarding receiving medication or if there is any unexpected change in co-pay.    Ardeen Fillers, CPhT Oncology Pharmacy Patient Advocate  [facility]  [phone] (phone) [phone] (fax) 09/28/2022 10:17 AM

## 2022-09-28 NOTE — Telephone Encounter (Signed)
The patient is due for their Colonoscopy. Their last colonoscopy was done...by Dr [name]. Dr [name] is no longer at our office and non of our providers do this service. We would like to know if you would like for [place] to send a referral to [facility] or to [facility] to have them contact you to get this scheduled.  She will follow up with Dr Tonna Boehringer to have this done.

## 2022-10-10 ENCOUNTER — Encounter: Payer: Self-pay | Admitting: Licensed Clinical Social Worker

## 2022-10-10 ENCOUNTER — Telehealth: Payer: Self-pay | Admitting: Licensed Clinical Social Worker

## 2022-10-10 ENCOUNTER — Ambulatory Visit: Payer: Self-pay | Admitting: Licensed Clinical Social Worker

## 2022-10-10 DIAGNOSIS — Z1379 Encounter for other screening for genetic and chromosomal anomalies: Secondary | ICD-10-CM | POA: Insufficient documentation

## 2022-10-10 NOTE — Progress Notes (Signed)
HPI:   Ms. Kaiser was previously seen in the [facility] due to a personal and family history of cancer and concerns regarding a hereditary predisposition to cancer. Please refer to our prior cancer genetics clinic note for more information regarding our discussion, assessment and recommendations, at the time. Ms. Chavolla recent genetic test results were disclosed to her, as were recommendations warranted by these results. These results and recommendations are discussed in more detail below.  CANCER HISTORY:  Oncology History Overview Note  # 2009- Sigmoid colon cancer stage II (T3 N0 4 lymph nodes sampled M0 stage II high risk there was obstruction and perforation abscess; Dr.Hern), workup included a low CEA preoperatively and a chest x-ray and CT scan abdomen pelvis negative for metastatic disease, S/P colostomy; s/p FOLFOX x 6 months. [Dr.Gittin ]  # LATER REVERSED , K RAS  wild type B RAF not evaluated   Also initial iron deficiency with anemia thrombocytosis, high platelets considered reactive, workup includes a normal PCR for CML and a negative [id number] mutation  DIAGNOSIS:  A. BREAST, RIGHT; LUMPECTOMY:  - INVASIVE MAMMARY CARCINOMA.  - DUCTAL CARCINOMA IN SITU (DCIS).  - SEE CANCER SUMMARY BELOW.  - BIOPSY SITE CHANGE WITH CLIP (2).  - FIBROUS TISSUE WITH CYST FORMATION AND FLORID DUCTAL HYPERPLASIA.  - SKIN AND NIPPLE NEGATIVE FOR MALIGNANCY.  - VASCULAR CALCIFICATION.   B. SENTINEL LYMPH NODES 1 AND 2, RIGHT AXILLA; EXCISION:  - METASTATIC CARCINOMA INVOLVES ONE OF TWO LYMPH NODES (1/2).   C. NON-SENTINEL LYMPH NODES, RIGHT AXILLA; EXCISION:  - ONE LYMPH NODE NEGATIVE FOR MALIGNANCY (0/1).   CANCER CASE SUMMARY: INVASIVE CARCINOMA OF THE BREAST  Standard(s): AJCC-UICC 8   SPECIMEN  Procedure: Lumpectomy  Specimen Laterality: Right   TUMOR  Histologic Type: Invasive carcinoma of no special type (ductal)  Histologic Grade (Nottingham Histologic Score)        Glandular (Acinar)/Tubular Differentiation: 3       Nuclear Pleomorphism: 3       Mitotic Rate: 3       Overall Grade: 3  Tumor Size: 24 mm  Tumor Focality: Single focus of invasive carcinoma  Ductal Carcinoma In Situ (DCIS): Present, nuclear grade 2-3  Tumor Extent: Not applicable  Lymphatic and/or Vascular Invasion: Present  Treatment Effect in the Breast: No known presurgical therapy   MARGINS  Margin Status for Invasive Carcinoma: All margins negative for invasive  carcinoma       Distance from closest margin: 10 mm       Specify closest margin: Superior   Margin Status for DCIS: All margins negative for DCIS       Distance from DCIS to closest margin: 10 mm       Specify closest margin: Superior   REGIONAL LYMPH NODES  Regional Lymph Node Status: Tumor present in regional lymph node(s)       Number of Lymph Nodes with Macrometastases (greater than 2 mm): 1       Number of Lymph Nodes with Micrometastases (greater than 0.2 mm to  2 mm and/or greater than 200 cells): 0       Number of Lymph Nodes with Isolated Tumor Cells (0.2 mm or less OR  200 cells or less): 0       Size of Largest Metastatic Deposit: 3 mm      Extranodal Extension: Not identified       Total Number of Lymph Nodes Examined (sentinel and non-sentinel): 3  Number of Sentinel Nodes Examined: 2   DISTANT METASTASIS  Distant Site(s) Involved, if applicable: Not applicable   PATHOLOGIC STAGE CLASSIFICATION (pTNM, AJCC 8th Edition):  Modified Classification: Not applicable  pT Category: pT2  T Suffix: Not applicable  pN Category: pN1a  N Suffix: (sn)  pM Category: Not applicable   SPECIAL STUDIES  Breast Biomarker Testing Performed on Previous Outside Biopsy:  23-250-T11  Per outside report:  Estrogen Receptor (ER) Status: POSITIVE          Percentage of cells with nuclear positivity: Greater than 90%          Average intensity of staining: Strong   Progesterone Receptor (PgR) Status:  POSITIVE          Percentage of cells with nuclear positivity: 60%          Average intensity of staining: Strong   HER2 (by immunohistochemistry): EQUIVOCAL (Score 2+)  HER2 FISH: NEGATIVE   Ki-67: Not performed   # Cytoxan Taxotere x 4 cycles; finished JAN, 2024. [Neutropenic fevers s/p cycle #4;  -FEB 2nd, 2024-right lung PE; left lower LE  DVT- on eliquis.   # FEB 19th-start RT  IMPRESSION: 1. Complex cystic and solid-appearing mass within the subareolar RIGHT breast, overall measuring 3.1 cm greatest dimension, the solid enhancing component at the anterior-lateral aspect of the mass measuring 2 x 1 cm, abutting the overlying skin but without evidence of involvement/enhancement of the skin. Susceptibility artifact within the mass may represent a biopsy clip (if biopsy has been performed at an outside site since the diagnostic mammogram/ultrasound of 12/28/2021) or this artifact could represent calcifications within the mass. This mass corresponds to the recent ultrasound finding of a complex irregular mass at the 11 o'clock axis of the retroareolar RIGHT breast. 2. No evidence of multifocal or multicentric disease within the RIGHT breast. 3. No evidence of contralateral disease in the LEFT breast.   RECOMMENDATION: If has not already been performed, recommend ultrasound-guided biopsy of the suspicious mass in the subareolar RIGHT breast, with preferential targeting of the anterior-lateral component of the mass which is shown to be the enhancing component on today's MRI.   BI-RADS CATEGORY  4: Suspicious.     Cancer of sigmoid (HCC) (Resolved)  Carcinoma of overlapping sites of right breast in female, estrogen receptor positive (HCC)  02/14/2022 Initial Diagnosis   Carcinoma of overlapping sites of right breast in female, estrogen receptor positive (HCC)   02/14/2022 Cancer Staging   Staging form: Breast, AJCC 8th Edition - Pathologic: Stage IIA (pT2, pN1, cM0, G3,  ER+, PR+, HER2-) - Signed by [name], MD on 02/14/2022 Histologic grading system: 3 grade system   02/23/2022 -  Chemotherapy   Patient is on Treatment Plan : BREAST TC q21d       FAMILY HISTORY:  We obtained a detailed, 4-generation family history.  Significant diagnoses are listed below: Family History  Problem Relation Age of Onset   Stroke Mother    Diabetes Mother    Colon cancer Father        dx 62s   Stroke Sister    Colon cancer Brother        dx 63s-70s   Brain cancer Brother        dx 72s-70s   Ms. Carreno has 1 son, 1 daughter, no cancers. She had 1 sister and 2 brothers. One brother had brain cancer and died in his 62s-70s and her other brother had colon cancer and died in  his 60s-70s.   Ms. Dewan mother passed at 61 of a stroke. No known cancers on this side of the family.   Ms. Michelini father passed of colon cancer in his 54s. No other known cancers on this side of the family.   Ms. Limbacher is unaware of previous family history of genetic testing for hereditary cancer risks. There is no reported Ashkenazi Jewish ancestry. There is no known consanguinity.     GENETIC TEST RESULTS:  The [facility] Multi-Cancer+RNA Panel found no pathogenic mutations.  The Multi-Cancer + RNA Panel offered by [facility] includes sequencing and/or deletion/duplication analysis of the following 70 genes:  AIP*, ALK, APC*, ATM*, AXIN2*, BAP1*, BARD1*, BLM*, BMPR1A*, BRCA1*, BRCA2*, BRIP1*, CDC73*, CDH1*, CDK4, CDKN1B*, CDKN2A, CHEK2*, CTNNA1*, DICER1*, EPCAM, EGFR, FH*, FLCN*, GREM1, HOXB13, KIT, LZTR1, MAX*, MBD4, MEN1*, MET, MITF, MLH1*, MSH2*, MSH3*, MSH6*, MUTYH*, NF1*, NF2*, NTHL1*, PALB2*, PDGFRA, PMS2*, POLD1*, POLE*, POT1*, PRKAR1A*, PTCH1*, PTEN*, RAD51C*, RAD51D*, RB1*, RET, SDHA*, SDHAF2*, SDHB*, SDHC*, SDHD*, SMAD4*, SMARCA4*, SMARCB1*, SMARCE1*, STK11*, SUFU*, TMEM127*, TP53*, TSC1*, TSC2*, VHL*. RNA analysis is performed for * genes.   The test report has been scanned  into EPIC and is located under the Molecular Pathology section of the Results Review tab.  A portion of the result report is included below for reference. Genetic testing reported out on 10/10/2022.     Even though a pathogenic variant was not identified, possible explanations for the cancer in the family may include: There may be no hereditary risk for cancer in the family. The cancers in Ms. Roeske and/or her family may be sporadic/familial or due to other genetic and environmental factors. There may be a gene mutation in one of these genes that current testing methods cannot detect but that chance is small. There could be another gene that has not yet been discovered, or that we have not yet tested, that is responsible for the cancer diagnoses in the family.  It is also possible there is a hereditary cause for the cancer in the family that Ms. Ghuman did not inherit.  Therefore, it is important to remain in touch with cancer genetics in the future so that we can continue to offer Ms. Santry the most up to date genetic testing.   ADDITIONAL GENETIC TESTING:  We discussed with Ms. Mizzi that her genetic testing was fairly extensive.  If there are additional relevant genes identified to increase cancer risk that can be analyzed in the future, we would be happy to discuss and coordinate this testing at that time.    CANCER SCREENING RECOMMENDATIONS:  Ms. Dolson test result is considered negative (normal).  This means that we have not identified a hereditary cause for her personal and family history of cancer at this time.   An individual's cancer risk and medical management are not determined by genetic test results alone. Overall cancer risk assessment incorporates additional factors, including personal medical history, family history, and any available genetic information that may result in a personalized plan for cancer prevention and surveillance. Therefore, it is recommended she continue to follow  the cancer management and screening guidelines provided by her oncology and primary healthcare provider.  RECOMMENDATIONS FOR FAMILY MEMBERS:   Since she did not inherit a identifiable mutation in a cancer predisposition gene included on this panel, her children could not have inherited a known mutation from her in one of these genes. Individuals in this family might be at some increased risk of developing cancer, over the general population risk, due to  the family history of cancer.  Individuals in the family should notify their providers of the family history of cancer. We recommend women in this family have a yearly mammogram beginning at age 44, or 37 years younger than the earliest onset of cancer, an annual clinical breast exam, and perform monthly breast self-exams.  Family members should have colonoscopies by at age 24, or earlier, as recommended by their providers.  FOLLOW-UP:  Lastly, we discussed with Ms. Filippini that cancer genetics is a rapidly advancing field and it is possible that new genetic tests will be appropriate for her and/or her family members in the future. We encouraged her to remain in contact with cancer genetics on an annual basis so we can update her personal and family histories and let her know of advances in cancer genetics that may benefit this family.   Our contact number was provided. Ms. Stanfill questions were answered to her satisfaction, and she knows she is welcome to call us at anytime with additional questions or concerns.    Lacy Duverney, MS, [facility] Genetic Counselor [email or web address] Phone: [phone]

## 2022-10-10 NOTE — Telephone Encounter (Signed)
I contacted [name] to discuss her genetic testing results. No pathogenic variants were identified in the 70 genes analyzed. Detailed clinic note to follow.   The test report has been scanned into EPIC and is located under the Molecular Pathology section of the Results Review tab.  A portion of the result report is included below for reference.      Sabrina Duverney, MS, [facility] Genetic Counselor [name] Phone: [phone]

## 2022-10-13 ENCOUNTER — Other Ambulatory Visit: Payer: Self-pay

## 2022-10-13 ENCOUNTER — Other Ambulatory Visit: Payer: Self-pay | Admitting: Family Medicine

## 2022-10-13 DIAGNOSIS — E785 Hyperlipidemia, unspecified: Secondary | ICD-10-CM

## 2022-10-14 ENCOUNTER — Inpatient Hospital Stay: Payer: Medicare Other | Attending: Internal Medicine

## 2022-10-14 ENCOUNTER — Encounter: Payer: Self-pay | Admitting: Nurse Practitioner

## 2022-10-14 ENCOUNTER — Inpatient Hospital Stay (HOSPITAL_BASED_OUTPATIENT_CLINIC_OR_DEPARTMENT_OTHER): Payer: Medicare Other | Admitting: Nurse Practitioner

## 2022-10-14 VITALS — BP 132/62 | HR 84 | Temp 97.4°F | Wt 179.0 lb

## 2022-10-14 DIAGNOSIS — C50811 Malignant neoplasm of overlapping sites of right female breast: Secondary | ICD-10-CM

## 2022-10-14 DIAGNOSIS — Z86711 Personal history of pulmonary embolism: Secondary | ICD-10-CM | POA: Diagnosis not present

## 2022-10-14 DIAGNOSIS — R7989 Other specified abnormal findings of blood chemistry: Secondary | ICD-10-CM

## 2022-10-14 DIAGNOSIS — Z923 Personal history of irradiation: Secondary | ICD-10-CM | POA: Diagnosis not present

## 2022-10-14 DIAGNOSIS — Z170 Estrogen receptor positive status [ER+]: Secondary | ICD-10-CM

## 2022-10-14 DIAGNOSIS — Z79811 Long term (current) use of aromatase inhibitors: Secondary | ICD-10-CM | POA: Diagnosis not present

## 2022-10-14 DIAGNOSIS — Z7901 Long term (current) use of anticoagulants: Secondary | ICD-10-CM | POA: Diagnosis not present

## 2022-10-14 DIAGNOSIS — R197 Diarrhea, unspecified: Secondary | ICD-10-CM | POA: Diagnosis not present

## 2022-10-14 DIAGNOSIS — M8580 Other specified disorders of bone density and structure, unspecified site: Secondary | ICD-10-CM | POA: Insufficient documentation

## 2022-10-14 DIAGNOSIS — Z87891 Personal history of nicotine dependence: Secondary | ICD-10-CM | POA: Insufficient documentation

## 2022-10-14 DIAGNOSIS — Z9221 Personal history of antineoplastic chemotherapy: Secondary | ICD-10-CM | POA: Insufficient documentation

## 2022-10-14 DIAGNOSIS — D696 Thrombocytopenia, unspecified: Secondary | ICD-10-CM | POA: Diagnosis not present

## 2022-10-14 DIAGNOSIS — D72819 Decreased white blood cell count, unspecified: Secondary | ICD-10-CM | POA: Diagnosis not present

## 2022-10-14 DIAGNOSIS — Z79899 Other long term (current) drug therapy: Secondary | ICD-10-CM | POA: Insufficient documentation

## 2022-10-14 DIAGNOSIS — Z86718 Personal history of other venous thrombosis and embolism: Secondary | ICD-10-CM | POA: Diagnosis not present

## 2022-10-14 LAB — CBC WITH DIFFERENTIAL (CANCER CENTER ONLY)
Abs Immature Granulocytes: 0.01 10*3/uL (ref 0.00–0.07)
Basophils Absolute: 0 10*3/uL (ref 0.0–0.1)
Basophils Relative: 1 %
Eosinophils Absolute: 0.2 10*3/uL (ref 0.0–0.5)
Eosinophils Relative: 6 %
HCT: 32.7 % — ABNORMAL LOW (ref 36.0–46.0)
Hemoglobin: 11 g/dL — ABNORMAL LOW (ref 12.0–15.0)
Immature Granulocytes: 0 %
Lymphocytes Relative: 23 %
Lymphs Abs: 0.9 10*3/uL (ref 0.7–4.0)
MCH: 33.1 pg (ref 26.0–34.0)
MCHC: 33.6 g/dL (ref 30.0–36.0)
MCV: 98.5 fL (ref 80.0–100.0)
Monocytes Absolute: 0.2 10*3/uL (ref 0.1–1.0)
Monocytes Relative: 5 %
Neutro Abs: 2.5 10*3/uL (ref 1.7–7.7)
Neutrophils Relative %: 65 %
Platelet Count: 135 10*3/uL — ABNORMAL LOW (ref 150–400)
RBC: 3.32 MIL/uL — ABNORMAL LOW (ref 3.87–5.11)
RDW: 13.2 % (ref 11.5–15.5)
WBC Count: 3.9 10*3/uL — ABNORMAL LOW (ref 4.0–10.5)
nRBC: 0 % (ref 0.0–0.2)

## 2022-10-14 LAB — CMP (CANCER CENTER ONLY)
ALT: 13 U/L (ref 0–44)
AST: 19 U/L (ref 15–41)
Albumin: 4.1 g/dL (ref 3.5–5.0)
Alkaline Phosphatase: 64 U/L (ref 38–126)
Anion gap: 9 (ref 5–15)
BUN: 25 mg/dL — ABNORMAL HIGH (ref 8–23)
CO2: 25 mmol/L (ref 22–32)
Calcium: 10 mg/dL (ref 8.9–10.3)
Chloride: 103 mmol/L (ref 98–111)
Creatinine: 1.27 mg/dL — ABNORMAL HIGH (ref 0.44–1.00)
GFR, Estimated: 43 mL/min — ABNORMAL LOW (ref >60)
Glucose, Bld: 141 mg/dL — ABNORMAL HIGH (ref 70–99)
Potassium: 3.9 mmol/L (ref 3.5–5.1)
Sodium: 137 mmol/L (ref 135–145)
Total Bilirubin: 1 mg/dL (ref 0.3–1.2)
Total Protein: 6.8 g/dL (ref 6.5–8.1)

## 2022-10-14 NOTE — Progress Notes (Signed)
[facility] CONSULT NOTE  Patient Care Team: [name], MD as PCP - General (Family Medicine) [name], MD as PCP - Cardiology (Cardiology) [name], [name], MD as Consulting Physician (General Surgery) [name], MD (Internal Medicine) [name], RN as Oncology Nurse Navigator [name], MD as Consulting Physician (Radiation Oncology) [name], MD as Consulting Physician (Internal Medicine) Sung Amabile, DO as Consulting Physician (General Surgery)  CHIEF COMPLAINTS/PURPOSE OF CONSULTATION: Breast cancer  #  Oncology History Overview Note  # 2009- Sigmoid colon cancer stage II (T3 N0 4 lymph nodes sampled M0 stage II high risk there was obstruction and perforation abscess; Dr.Hern), workup included a low CEA preoperatively and a chest x-ray and CT scan abdomen pelvis negative for metastatic disease, S/P colostomy; s/p FOLFOX x 6 months. [Dr.Gittin ]  # LATER REVERSED , K RAS  wild type B RAF not evaluated   Also initial iron deficiency with anemia thrombocytosis, high platelets considered reactive, workup includes a normal PCR for CML and a negative [id number] mutation  DIAGNOSIS:  A. BREAST, RIGHT; LUMPECTOMY:  - INVASIVE MAMMARY CARCINOMA.  - DUCTAL CARCINOMA IN SITU (DCIS).  - SEE CANCER SUMMARY BELOW.  - BIOPSY SITE CHANGE WITH CLIP (2).  - FIBROUS TISSUE WITH CYST FORMATION AND FLORID DUCTAL HYPERPLASIA.  - SKIN AND NIPPLE NEGATIVE FOR MALIGNANCY.  - VASCULAR CALCIFICATION.   B. SENTINEL LYMPH NODES 1 AND 2, RIGHT AXILLA; EXCISION:  - METASTATIC CARCINOMA INVOLVES ONE OF TWO LYMPH NODES (1/2).   C. NON-SENTINEL LYMPH NODES, RIGHT AXILLA; EXCISION:  - ONE LYMPH NODE NEGATIVE FOR MALIGNANCY (0/1).   CANCER CASE SUMMARY: INVASIVE CARCINOMA OF THE BREAST  Standard(s): AJCC-UICC 8   SPECIMEN  Procedure: Lumpectomy  Specimen Laterality: Right   TUMOR  Histologic Type: Invasive carcinoma of no special type  (ductal)  Histologic Grade (Nottingham Histologic Score)       Glandular (Acinar)/Tubular Differentiation: 3       Nuclear Pleomorphism: 3       Mitotic Rate: 3       Overall Grade: 3  Tumor Size: 24 mm  Tumor Focality: Single focus of invasive carcinoma  Ductal Carcinoma In Situ (DCIS): Present, nuclear grade 2-3  Tumor Extent: Not applicable  Lymphatic and/or Vascular Invasion: Present  Treatment Effect in the Breast: No known presurgical therapy   MARGINS  Margin Status for Invasive Carcinoma: All margins negative for invasive  carcinoma       Distance from closest margin: 10 mm       Specify closest margin: Superior   Margin Status for DCIS: All margins negative for DCIS       Distance from DCIS to closest margin: 10 mm       Specify closest margin: Superior   REGIONAL LYMPH NODES  Regional Lymph Node Status: Tumor present in regional lymph node(s)       Number of Lymph Nodes with Macrometastases (greater than 2 mm): 1       Number of Lymph Nodes with Micrometastases (greater than 0.2 mm to  2 mm and/or greater than 200 cells): 0       Number of Lymph Nodes with Isolated Tumor Cells (0.2 mm or less OR  200 cells or less): 0       Size of Largest Metastatic Deposit: 3 mm      Extranodal Extension: Not identified       Total Number of Lymph Nodes Examined (sentinel and non-sentinel): 3  Number of Sentinel Nodes Examined: 2   DISTANT METASTASIS  Distant Site(s) Involved, if applicable: Not applicable   PATHOLOGIC STAGE CLASSIFICATION (pTNM, AJCC 8th Edition):  Modified Classification: Not applicable  pT Category: pT2  T Suffix: Not applicable  pN Category: pN1a  N Suffix: (sn)  pM Category: Not applicable   SPECIAL STUDIES  Breast Biomarker Testing Performed on Previous Outside Biopsy:  23-250-T11  Per outside report:  Estrogen Receptor (ER) Status: POSITIVE          Percentage of cells with nuclear positivity: Greater than 90%          Average intensity of  staining: Strong   Progesterone Receptor (PgR) Status: POSITIVE          Percentage of cells with nuclear positivity: 60%          Average intensity of staining: Strong   HER2 (by immunohistochemistry): EQUIVOCAL (Score 2+)  HER2 FISH: NEGATIVE   Ki-67: Not performed   # Cytoxan Taxotere x 4 cycles; finished JAN, 2024. [Neutropenic fevers s/p cycle #4;  -FEB 2nd, 2024-right lung PE; left lower LE  DVT- on eliquis.   # FEB 19th-start RT  IMPRESSION: 1. Complex cystic and solid-appearing mass within the subareolar RIGHT breast, overall measuring 3.1 cm greatest dimension, the solid enhancing component at the anterior-lateral aspect of the mass measuring 2 x 1 cm, abutting the overlying skin but without evidence of involvement/enhancement of the skin. Susceptibility artifact within the mass may represent a biopsy clip (if biopsy has been performed at an outside site since the diagnostic mammogram/ultrasound of 12/28/2021) or this artifact could represent calcifications within the mass. This mass corresponds to the recent ultrasound finding of a complex irregular mass at the 11 o'clock axis of the retroareolar RIGHT breast. 2. No evidence of multifocal or multicentric disease within the RIGHT breast. 3. No evidence of contralateral disease in the LEFT breast.   RECOMMENDATION: If has not already been performed, recommend ultrasound-guided biopsy of the suspicious mass in the subareolar RIGHT breast, with preferential targeting of the anterior-lateral component of the mass which is shown to be the enhancing component on today's MRI.   BI-RADS CATEGORY  4: Suspicious.     Cancer of sigmoid (HCC) (Resolved)  Carcinoma of overlapping sites of right breast in female, estrogen receptor positive (HCC)  02/14/2022 Initial Diagnosis   Carcinoma of overlapping sites of right breast in female, estrogen receptor positive (HCC)   02/14/2022 Cancer Staging   Staging form: Breast, AJCC  8th Edition - Pathologic: Stage IIA (pT2, pN1, cM0, G3, ER+, PR+, HER2-) - Signed by [name], MD on 02/14/2022 Histologic grading system: 3 grade system   02/23/2022 -  Chemotherapy   Patient is on Treatment Plan : BREAST TC q21d      HISTORY OF PRESENTING ILLNESS:   Alone.  Ambulating independently.   Sabrina Wilson 82 y.o.  female patient with Stage II, ER/PR positive Her 2 Negative breast cancer, T2 N1, currently s/p adjuvant chemotherapy and adjuvant radiation, had DVT PE, on eliquis, who presents for follow up. She continues anastrozole and most recently on added abemaciclib. Tolerating well. Has some diarrhea that is tolerable. Hasn't tried imodium d/t concerns of constipation. She has met with genetics. Aches and pains are stable. No interval infections. Denies other complaints    Review of Systems  Constitutional:  Negative for chills, diaphoresis, fever, malaise/fatigue and weight loss.  HENT:  Negative for nosebleeds and sore throat.   Eyes:  Negative for double vision.  Respiratory:  Negative for cough, hemoptysis, sputum production, shortness of breath and wheezing.   Cardiovascular:  Negative for chest pain, palpitations, orthopnea and leg swelling.  Gastrointestinal:  Positive for diarrhea. Negative for abdominal pain, blood in stool, constipation, heartburn, melena, nausea and vomiting.  Genitourinary:  Negative for dysuria, flank pain, frequency, hematuria and urgency.  Musculoskeletal:  Negative for back pain and joint pain.  Skin:  Negative for itching and rash.  Neurological:  Negative for dizziness, tingling, focal weakness, weakness and headaches.  Endo/Heme/Allergies:  Does not bruise/bleed easily.  Psychiatric/Behavioral:  Negative for depression. The patient is not nervous/anxious and does not have insomnia.      MEDICAL HISTORY:  Past Medical History:  Diagnosis Date   Actinic keratosis 02/12/2020   R lat calf (hypertrophic)   Arthritis     Cancer of sigmoid (HCC) 06/17/2016   Colon cancer (HCC) 2009   T3,N0; s/p resection  Dr. Earnestine Leys and chemotherapy by Dr. Neale Burly   Diastolic dysfunction    a. 08/2020 Echo: EF 60-65%, no rwma, Gr1 DD, nl RV size/fxn. Mild MR.   Hernia of flank    History of actinic keratoses 08/11/2020   Bx proven at left lateral ankle proximal, LN2 10/08/20   History of stress test    a. 09/2020 MV: EF >65%. No ischemia/infarct. Mild Ao Ca2+ and minimal Cor Ca2+.   Hypertension    Neuropathy    Polyp at cervical os    s/p resection Dr. Hyacinth Meeker   Skin cancer 01/22/2020   surface of an atypical verrucous squamous proliferation    Squamous cell carcinoma of skin 07/16/2020   Left lateral ankle anterior, [facility] with [facility] 08-11-2020    SURGICAL HISTORY: Past Surgical History:  Procedure Laterality Date   BREAST BIOPSY Right 05/2014   Dr. Rutherford Nail office-benign   BREAST LUMPECTOMY WITH SENTINEL LYMPH NODE BIOPSY Right 01/21/2022   Procedure: BREAST LUMPECTOMY WITH SENTINEL LYMPH NODE BX;  Surgeon: [name], MD;  Location: [facility] ORS;  Service: General;  Laterality: Right;   BREAST SURGERY Right February 2016   Vacuum assisted biopsy for recurrent cyst, fibrocystic changes without evidence of malignancy.   CATARACT EXTRACTION W/PHACO Left 01/13/2015   Procedure: CATARACT EXTRACTION PHACO AND INTRAOCULAR LENS PLACEMENT (IOC);  Surgeon: Galen Manila, MD;  Location: [facility] ORS;  Service: Ophthalmology;  Laterality: Left;  [place]  1:02.9 AP   19.2 CDE  12.06 casette lot #  [id number]   CATARACT EXTRACTION W/PHACO Right 02/03/2015   Procedure: CATARACT EXTRACTION PHACO AND INTRAOCULAR LENS PLACEMENT (IOC);  Surgeon: Galen Manila, MD;  Location: [facility] ORS;  Service: Ophthalmology;  Laterality: Right;  us00:53 ap46.5 [id number]   COLECTOMY     COLONOSCOPY  2015   Dr Bluford Kaufmann   COLONOSCOPY WITH PROPOFOL N/A 09/13/2017   Procedure: COLONOSCOPY WITH PROPOFOL;  Surgeon: [name], MD;  Location: [facility] ENDOSCOPY;   Service: Endoscopy;  Laterality: N/A;   COLONOSCOPY WITH PROPOFOL N/A 11/08/2017   Procedure: COLONOSCOPY WITH PROPOFOL;  Surgeon: [name], MD;  Location: [facility] ENDOSCOPY;  Service: Endoscopy;  Laterality: N/A;   colostomy reversal     DILATION AND CURETTAGE OF UTERUS  2014   Dr. Hyacinth Meeker, benign per pt   EYE SURGERY     HERNIA REPAIR  07/13/12   ventral    SKIN CANCER EXCISION     TONSILLECTOMY      SOCIAL HISTORY: Social History   Socioeconomic History   Marital status: Married  Spouse name: Not on file   Number of children: Not on file   Years of education: Not on file   Highest education level: Not on file  Occupational History   Not on file  Tobacco Use   Smoking status: Former    Types: Cigarettes    Quit date: 10/12/2007    Years since quitting: 15.0   Smokeless tobacco: Never  Vaping Use   Vaping Use: Never used  Substance and Sexual Activity   Alcohol use: Yes    Alcohol/week: 1.0 standard drink of alcohol    Types: 1 Standard drinks or equivalent per week    Comment: with dinner GLASS OF WINE EACH DAY   Drug use: No   Sexual activity: Not on file  Other Topics Concern   Not on file  Social History Narrative   Lives in [place] with husband. 2 children, son lives nearby.Diet - regularExercise - none. 30 mins/in [place]. Quit smoking in 2009. Wine before dinner. Pianist in church; real estate; Diplomatic Services operational officer.    Social Determinants of Health   Financial Resource Strain: Low Risk  (05/19/2022)   Overall Financial Resource Strain (CARDIA)    Difficulty of Paying Living Expenses: Not very hard  Food Insecurity: No Food Insecurity (06/03/2022)   Hunger Vital Sign    Worried About Running Out of Food in the Last Year: Never true    Ran Out of Food in the Last Year: Never true  Transportation Needs: No Transportation Needs (05/28/2022)   PRAPARE - Administrator, Civil Service (Medical): No    Lack of Transportation (Non-Medical): No   Physical Activity: Not on file  Stress: No Stress Concern Present (11/16/2020)   [employer] of Occupational Health - Occupational Stress Questionnaire    Feeling of Stress : Not at all  Social Connections: Not on file  Intimate Partner Violence: Not At Risk (05/28/2022)   Humiliation, Afraid, Rape, and Kick questionnaire    Fear of Current or Ex-Partner: No    Emotionally Abused: No    Physically Abused: No    Sexually Abused: No    FAMILY HISTORY: Family History  Problem Relation Age of Onset   Stroke Mother    Diabetes Mother    Colon cancer Father        dx 6s   Stroke Sister    Colon cancer Brother        dx 14s-70s   Brain cancer Brother        dx 60s-70s    ALLERGIES:  is allergic to sulfa antibiotics.  MEDICATIONS:  Current Outpatient Medications  Medication Sig Dispense Refill   abemaciclib (VERZENIO) 100 MG tablet Take 1 tablet (100 mg total) by mouth 2 (two) times daily. 56 tablet 0   albuterol (VENTOLIN HFA) 108 (90 Base) MCG/ACT inhaler Inhale 2 puffs into the lungs every 6 (six) hours as needed for wheezing or shortness of breath. 8 g 2   anastrozole (ARIMIDEX) 1 MG tablet Take 1 tablet (1 mg total) by mouth daily. 30 tablet 4   apixaban (ELIQUIS) 5 MG TABS tablet Two tabs po twice a day for five days then 1 tab po twice a day afterwards 60 tablet 4   atorvastatin (LIPITOR) 10 MG tablet TAKE 1 TABLET BY MOUTH DAILY 90 tablet 1   Calcium Carbonate-Vit D-Min (CALCIUM 1200 PO) Take 1 tablet by mouth daily.     guaiFENesin (MUCINEX) 600 MG 12 hr tablet Take 1 tablet (600 mg total)  by mouth 2 (two) times daily as needed for cough or to loosen phlegm. 10 tablet 0   halobetasol (ULTRAVATE) 0.05 % cream Apply topically to aa's of rash at body twice daily on weekends only as needed. Avoid applying to face, groin, and axilla. Use as directed. 50 g 1   ketoconazole (NIZORAL) 2 % cream Apply 1 Application topically 2 (two) times daily. Apply to fingernails 30 g 0    niacinamide 500 MG tablet Take 500 mg by mouth 2 (two) times daily.     Omega-3 Fatty Acids (FISH OIL PO) Take 1 capsule by mouth daily.     tacrolimus (PROTOPIC) 0.1 % ointment Apply topically to affected areas of rash twice daily on week days only prn 60 g 1   umeclidinium bromide (INCRUSE ELLIPTA) 62.5 MCG/ACT AEPB Inhale 1 puff into the lungs daily. 30 each 0   VITAMIN D, CHOLECALCIFEROL, PO Take 2,000 Units by mouth daily.     No current facility-administered medications for this visit.    PHYSICAL EXAMINATION: Vitals:   10/14/22 1426  BP: 132/62  Pulse: 84  Temp: (!) 97.4 F (36.3 C)  SpO2: 99%   Filed Weights   10/14/22 1426  Weight: 179 lb (81.2 kg)   Physical Exam Vitals reviewed.  Constitutional:      Appearance: She is not ill-appearing.  HENT:     Head: Normocephalic and atraumatic.  Cardiovascular:     Rate and Rhythm: Normal rate and regular rhythm.  Pulmonary:     Effort: No respiratory distress.     Breath sounds: No wheezing.     Comments: Decreased breath sounds bilaterally.  Abdominal:     General: There is no distension.     Tenderness: There is no abdominal tenderness.  Skin:    General: Skin is warm.     Coloration: Skin is not pale.  Neurological:     Mental Status: She is alert and oriented to person, place, and time.  Psychiatric:        Mood and Affect: Mood normal.        Behavior: Behavior normal.    LABORATORY DATA:  I have reviewed the data as listed Lab Results  Component Value Date   WBC 3.9 (L) 10/14/2022   HGB 11.0 (L) 10/14/2022   HCT 32.7 (L) 10/14/2022   MCV 98.5 10/14/2022   PLT 135 (L) 10/14/2022   Recent Labs    06/08/22 1031 08/10/22 1416 10/14/22 1402  NA 135 136 137  K 4.4 3.6 3.9  CL 100 102 103  CO2 27 26 25   GLUCOSE 113* 136* 141*  BUN 15 19 25*  CREATININE 0.76 0.88 1.27*  CALCIUM 9.7 9.8 10.0  GFRNONAA >60 >60 43*  PROT 6.2* 6.7 6.8  ALBUMIN 3.3* 4.1 4.1  AST 18 24 19   ALT 26 15 13   ALKPHOS 57  64 64  BILITOT 0.6 0.5 1.0    RADIOGRAPHIC STUDIES: I have personally reviewed the radiological images as listed and agreed with the findings in the report. No results found.  ASSESSMENT & PLAN:   # Right breast cancer -[facility]; ER/PR positive Her 2 Negative; G-3; LVI + T2N1. Stage II [OCT 2023; Dr.Byrnett] s/p Lumpectomy; node positive-Oncotype RS- 35- s/p adjuvant chemotherapy with Taxotere-Cytoxan every 3 weeks x 4 cycles. Completed post lumpectomy radiation April 3rd 2024. Abema x 2 years and zometa. Currently on anastrozole daily (April 2024), tolerating well, with plan for 5-10 years. Adjuvant abemaciclib-CDK inhibitor was recommended given her  high risk malignancy. She started May 2024. Tolerating well- diarrhea and elevated serum creatinine.   # Diarrhea- secondary to verzenio. G1. Recommended trial imodium 1/2 tablet or 1 tablet for loose stool.   # elevated serum creatinine - secondary to verzenio. Creatinine 1.27. Baseline  0.6-0.8. No dose adjustment necessary. Monitor for now.    # [2nd, FEB 2024] CTA- acute pulmonary embolism, with new segmental to subsegmental embolus present in the right lower lobe/right lower lobe-pulmonary infarct. LEFT LE- Acute DVT- on anticoagulation with eliquis on 5 mg BID X6 months- [provoked sec to chemo]. Stop eliquis in End of July/2024.  Patient would be cleared to have surgery for the eyes after coming off her anticoagulation.    # Bone health-2023 osteopenic T-score of -1.1.Continue calcium plus vitamin D.- stable.  I discussed the role of adjuvant Zometa every 6 months x 3 years. Awaiting dental evaluation [[facility] vs others- will send referral if needed].  Continue ca+Vit D BID.    # Genetic testing: brother-& father- colon cancer; other brother- brain tumor; no breast cancers in family. pending genetic blood work today.   # Thrombocytopenia- plt 135. Likely related to abemaciclib. Monitor.   # Leukopenia- wbc decreased but normal anc. Monitor.    #  Right arm decreased range of motion/swelling- ?  Lymphedema- s/p evaluation- Maureen/OT- stable.    # IV Access: PIV    # DISPOSITION: Follow up with Dr Donneta Romberg in 4 weeks as scheduled- la  No problem-specific Assessment & Plan notes found for this encounter.  All questions were answered. The patient/family knows to call the clinic with any problems, questions or concerns.    Alinda Dooms, NP 10/14/2022

## 2022-10-17 ENCOUNTER — Other Ambulatory Visit: Payer: Self-pay

## 2022-10-19 ENCOUNTER — Other Ambulatory Visit (HOSPITAL_COMMUNITY): Payer: Self-pay

## 2022-10-25 ENCOUNTER — Other Ambulatory Visit: Payer: Self-pay

## 2022-10-25 ENCOUNTER — Other Ambulatory Visit (HOSPITAL_COMMUNITY): Payer: Self-pay

## 2022-10-25 ENCOUNTER — Other Ambulatory Visit: Payer: Self-pay | Admitting: Pharmacist

## 2022-10-25 DIAGNOSIS — C50811 Malignant neoplasm of overlapping sites of right female breast: Secondary | ICD-10-CM

## 2022-10-25 MED ORDER — ABEMACICLIB 100 MG PO TABS
100.0000 mg | ORAL_TABLET | Freq: Two times a day (BID) | ORAL | 0 refills | Status: DC
Start: 2022-10-25 — End: 2022-11-15
  Filled 2022-10-25: qty 56, 28d supply, fill #0

## 2022-11-04 ENCOUNTER — Other Ambulatory Visit: Payer: Self-pay | Admitting: Internal Medicine

## 2022-11-11 DIAGNOSIS — H353131 Nonexudative age-related macular degeneration, bilateral, early dry stage: Secondary | ICD-10-CM | POA: Diagnosis not present

## 2022-11-11 DIAGNOSIS — H02403 Unspecified ptosis of bilateral eyelids: Secondary | ICD-10-CM | POA: Diagnosis not present

## 2022-11-11 DIAGNOSIS — H57813 Brow ptosis, bilateral: Secondary | ICD-10-CM | POA: Diagnosis not present

## 2022-11-11 DIAGNOSIS — Z0100 Encounter for examination of eyes and vision without abnormal findings: Secondary | ICD-10-CM | POA: Diagnosis not present

## 2022-11-11 DIAGNOSIS — H43813 Vitreous degeneration, bilateral: Secondary | ICD-10-CM | POA: Diagnosis not present

## 2022-11-15 ENCOUNTER — Other Ambulatory Visit: Payer: Self-pay | Admitting: Pharmacist

## 2022-11-15 ENCOUNTER — Other Ambulatory Visit: Payer: Self-pay | Admitting: *Deleted

## 2022-11-15 ENCOUNTER — Other Ambulatory Visit (HOSPITAL_COMMUNITY): Payer: Self-pay

## 2022-11-15 DIAGNOSIS — C50811 Malignant neoplasm of overlapping sites of right female breast: Secondary | ICD-10-CM

## 2022-11-16 ENCOUNTER — Inpatient Hospital Stay: Payer: Medicare Other | Attending: Internal Medicine

## 2022-11-16 ENCOUNTER — Inpatient Hospital Stay (HOSPITAL_BASED_OUTPATIENT_CLINIC_OR_DEPARTMENT_OTHER): Payer: Medicare Other | Admitting: Internal Medicine

## 2022-11-16 ENCOUNTER — Encounter: Payer: Self-pay | Admitting: Internal Medicine

## 2022-11-16 VITALS — BP 184/81 | HR 80 | Temp 98.0°F | Resp 16 | Ht 66.5 in | Wt 182.0 lb

## 2022-11-16 DIAGNOSIS — Z9221 Personal history of antineoplastic chemotherapy: Secondary | ICD-10-CM | POA: Insufficient documentation

## 2022-11-16 DIAGNOSIS — Z87891 Personal history of nicotine dependence: Secondary | ICD-10-CM | POA: Diagnosis not present

## 2022-11-16 DIAGNOSIS — Z85038 Personal history of other malignant neoplasm of large intestine: Secondary | ICD-10-CM | POA: Diagnosis not present

## 2022-11-16 DIAGNOSIS — Z170 Estrogen receptor positive status [ER+]: Secondary | ICD-10-CM

## 2022-11-16 DIAGNOSIS — M8580 Other specified disorders of bone density and structure, unspecified site: Secondary | ICD-10-CM | POA: Insufficient documentation

## 2022-11-16 DIAGNOSIS — C50811 Malignant neoplasm of overlapping sites of right female breast: Secondary | ICD-10-CM

## 2022-11-16 DIAGNOSIS — Z79811 Long term (current) use of aromatase inhibitors: Secondary | ICD-10-CM | POA: Diagnosis not present

## 2022-11-16 LAB — CMP (CANCER CENTER ONLY)
ALT: 13 U/L (ref 0–44)
AST: 19 U/L (ref 15–41)
Albumin: 4.1 g/dL (ref 3.5–5.0)
Alkaline Phosphatase: 51 U/L (ref 38–126)
Anion gap: 7 (ref 5–15)
BUN: 25 mg/dL — ABNORMAL HIGH (ref 8–23)
CO2: 26 mmol/L (ref 22–32)
Calcium: 9.6 mg/dL (ref 8.9–10.3)
Chloride: 103 mmol/L (ref 98–111)
Creatinine: 1.14 mg/dL — ABNORMAL HIGH (ref 0.44–1.00)
GFR, Estimated: 49 mL/min — ABNORMAL LOW (ref >60)
Glucose, Bld: 112 mg/dL — ABNORMAL HIGH (ref 70–99)
Potassium: 3.9 mmol/L (ref 3.5–5.1)
Sodium: 136 mmol/L (ref 135–145)
Total Bilirubin: 0.6 mg/dL (ref 0.3–1.2)
Total Protein: 6.5 g/dL (ref 6.5–8.1)

## 2022-11-16 LAB — CBC WITH DIFFERENTIAL (CANCER CENTER ONLY)
Abs Immature Granulocytes: 0.01 10*3/uL (ref 0.00–0.07)
Basophils Absolute: 0.1 10*3/uL (ref 0.0–0.1)
Basophils Relative: 2 %
Eosinophils Absolute: 0.1 10*3/uL (ref 0.0–0.5)
Eosinophils Relative: 4 %
HCT: 30.7 % — ABNORMAL LOW (ref 36.0–46.0)
Hemoglobin: 10.1 g/dL — ABNORMAL LOW (ref 12.0–15.0)
Immature Granulocytes: 0 %
Lymphocytes Relative: 27 %
Lymphs Abs: 0.9 10*3/uL (ref 0.7–4.0)
MCH: 34 pg (ref 26.0–34.0)
MCHC: 32.9 g/dL (ref 30.0–36.0)
MCV: 103.4 fL — ABNORMAL HIGH (ref 80.0–100.0)
Monocytes Absolute: 0.4 10*3/uL (ref 0.1–1.0)
Monocytes Relative: 12 %
Neutro Abs: 1.8 10*3/uL (ref 1.7–7.7)
Neutrophils Relative %: 55 %
Platelet Count: 172 10*3/uL (ref 150–400)
RBC: 2.97 MIL/uL — ABNORMAL LOW (ref 3.87–5.11)
RDW: 18.4 % — ABNORMAL HIGH (ref 11.5–15.5)
WBC Count: 3.3 10*3/uL — ABNORMAL LOW (ref 4.0–10.5)
nRBC: 0 % (ref 0.0–0.2)

## 2022-11-16 NOTE — Assessment & Plan Note (Addendum)
#   Right breast cancer -[facility]; ER/PR positive Her 2 Negative ;  G-3; LVI + T2N1.  Stage II [OCT 2023; Dr.Byrnett] s/p Lumpectomy; node positive-Oncotype RS- 35- s/p  adjuvant chemotherapy with Taxotere-Cytoxan every 3 weeks x4 cycles. Consider abema x 2 years/zometa.   S/p postlumpectomy radiation April 3rd 2024.    # Currently on anastrozole once a day [April 2024]-tolerating well without any major side effects.    # on  adjuvant abemaciclib 100 mg BID [started second week of june]-tolerating except for fatigue.  care program referral re: fatigue. Labs-CBC/chemistries were reviewed with the patient.  # [2nd,FEB 2024] CTA-  acute pulmonary embolism, with new segmental to subsegmental embolus present in the right lower lobe/right lower lobe-pulmonary infarct. LEFT LE- Acute DVT-on anticoagulation with eliquis on 5 mg BID X 6 months- [provoked sec to chemo]. Stop eliquis in End of  AUG 2024.    # Bone health-2023 osteopenic T-score of -1.1.Continue calcium plus vitamin D.- stable.  I discussed the role of adjuvant Zometa every 6 months x 3 years. Awaiting dental evaluation [[facility] vs others- will send referral if needed].  Continue ca+Vit D BID.   # Genetic testing: brother-& father- colon cancer; other brother- brain tumor; no breast cancers in family. pending genetic blood work today.   # Right arm decreased range of motion/swelling- ?  Lymphedema- s/p evaluation- Maureen/OT- stable.   # IV Access: PIV   # DISPOSITION: # care program referral re: fatigue # follow in 4 weeks-  MD;labs- cbc/cmp;vit D 25-OH- Dr.B

## 2022-11-16 NOTE — Progress Notes (Signed)
[facility] CONSULT NOTE  Patient Care Team: [name], MD as PCP - General (Family Medicine) [name], MD as PCP - Cardiology (Cardiology) [name], [name], MD as Consulting Physician (General Surgery) [name], MD (Internal Medicine) [name], RN as Oncology Nurse Navigator [name], MD as Consulting Physician (Radiation Oncology) [name], MD as Consulting Physician (Internal Medicine) Sung Amabile, DO as Consulting Physician (General Surgery)  CHIEF COMPLAINTS/PURPOSE OF CONSULTATION: Breast cancer  #  Oncology History Overview Note  # 2009- Sigmoid colon cancer stage II (T3 N0 4 lymph nodes sampled M0 stage II high risk there was obstruction and perforation abscess; Dr.Hern), workup included a low CEA preoperatively and a chest x-ray and CT scan abdomen pelvis negative for metastatic disease, S/P colostomy; s/p FOLFOX x 6 months. [Dr.Gittin ]  # LATER REVERSED , K RAS  wild type B RAF not evaluated   Also initial iron deficiency with anemia thrombocytosis, high platelets considered reactive, workup includes a normal PCR for CML and a negative [id number] mutation  DIAGNOSIS:  A. BREAST, RIGHT; LUMPECTOMY:  - INVASIVE MAMMARY CARCINOMA.  - DUCTAL CARCINOMA IN SITU (DCIS).  - SEE CANCER SUMMARY BELOW.  - BIOPSY SITE CHANGE WITH CLIP (2).  - FIBROUS TISSUE WITH CYST FORMATION AND FLORID DUCTAL HYPERPLASIA.  - SKIN AND NIPPLE NEGATIVE FOR MALIGNANCY.  - VASCULAR CALCIFICATION.   B. SENTINEL LYMPH NODES 1 AND 2, RIGHT AXILLA; EXCISION:  - METASTATIC CARCINOMA INVOLVES ONE OF TWO LYMPH NODES (1/2).   C. NON-SENTINEL LYMPH NODES, RIGHT AXILLA; EXCISION:  - ONE LYMPH NODE NEGATIVE FOR MALIGNANCY (0/1).   CANCER CASE SUMMARY: INVASIVE CARCINOMA OF THE BREAST  Standard(s): AJCC-UICC 8   SPECIMEN  Procedure: Lumpectomy  Specimen Laterality: Right   TUMOR  Histologic Type: Invasive carcinoma of no special type  (ductal)  Histologic Grade (Nottingham Histologic Score)       Glandular (Acinar)/Tubular Differentiation: 3       Nuclear Pleomorphism: 3       Mitotic Rate: 3       Overall Grade: 3  Tumor Size: 24 mm  Tumor Focality: Single focus of invasive carcinoma  Ductal Carcinoma In Situ (DCIS): Present, nuclear grade 2-3  Tumor Extent: Not applicable  Lymphatic and/or Vascular Invasion: Present  Treatment Effect in the Breast: No known presurgical therapy   MARGINS  Margin Status for Invasive Carcinoma: All margins negative for invasive  carcinoma       Distance from closest margin: 10 mm       Specify closest margin: Superior   Margin Status for DCIS: All margins negative for DCIS       Distance from DCIS to closest margin: 10 mm       Specify closest margin: Superior   REGIONAL LYMPH NODES  Regional Lymph Node Status: Tumor present in regional lymph node(s)       Number of Lymph Nodes with Macrometastases (greater than 2 mm): 1       Number of Lymph Nodes with Micrometastases (greater than 0.2 mm to  2 mm and/or greater than 200 cells): 0       Number of Lymph Nodes with Isolated Tumor Cells (0.2 mm or less OR  200 cells or less): 0       Size of Largest Metastatic Deposit: 3 mm      Extranodal Extension: Not identified       Total Number of Lymph Nodes Examined (sentinel and non-sentinel): 3  Number of Sentinel Nodes Examined: 2   DISTANT METASTASIS  Distant Site(s) Involved, if applicable: Not applicable   PATHOLOGIC STAGE CLASSIFICATION (pTNM, AJCC 8th Edition):  Modified Classification: Not applicable  pT Category: pT2  T Suffix: Not applicable  pN Category: pN1a  N Suffix: (sn)  pM Category: Not applicable   SPECIAL STUDIES  Breast Biomarker Testing Performed on Previous Outside Biopsy:  23-250-T11  Per outside report:  Estrogen Receptor (ER) Status: POSITIVE          Percentage of cells with nuclear positivity: Greater than 90%          Average intensity of  staining: Strong   Progesterone Receptor (PgR) Status: POSITIVE          Percentage of cells with nuclear positivity: 60%          Average intensity of staining: Strong   HER2 (by immunohistochemistry): EQUIVOCAL (Score 2+)  HER2 FISH: NEGATIVE   Ki-67: Not performed   # Cytoxan Taxotere x 4 cycles; finished JAN, 2024. [Neutropenic fevers s/p cycle #4;  -FEB 2nd, 2024-right lung PE; left lower LE  DVT- on eliquis.   # FEB 19th-start RT  IMPRESSION: 1. Complex cystic and solid-appearing mass within the subareolar RIGHT breast, overall measuring 3.1 cm greatest dimension, the solid enhancing component at the anterior-lateral aspect of the mass measuring 2 x 1 cm, abutting the overlying skin but without evidence of involvement/enhancement of the skin. Susceptibility artifact within the mass may represent a biopsy clip (if biopsy has been performed at an outside site since the diagnostic mammogram/ultrasound of 12/28/2021) or this artifact could represent calcifications within the mass. This mass corresponds to the recent ultrasound finding of a complex irregular mass at the 11 o'clock axis of the retroareolar RIGHT breast. 2. No evidence of multifocal or multicentric disease within the RIGHT breast. 3. No evidence of contralateral disease in the LEFT breast.   RECOMMENDATION: If has not already been performed, recommend ultrasound-guided biopsy of the suspicious mass in the subareolar RIGHT breast, with preferential targeting of the anterior-lateral component of the mass which is shown to be the enhancing component on today's MRI.   BI-RADS CATEGORY  4: Suspicious.     Cancer of sigmoid (HCC) (Resolved)  Carcinoma of overlapping sites of right breast in female, estrogen receptor positive (HCC)  02/14/2022 Initial Diagnosis   Carcinoma of overlapping sites of right breast in female, estrogen receptor positive (HCC)   02/14/2022 Cancer Staging   Staging form: Breast, AJCC  8th Edition - Pathologic: Stage IIA (pT2, pN1, cM0, G3, ER+, PR+, HER2-) - Signed by [name], MD on 02/14/2022 Histologic grading system: 3 grade system   02/23/2022 -  Chemotherapy   Patient is on Treatment Plan : BREAST TC q21d      HISTORY OF PRESENTING ILLNESS:   Alone.  Ambulating independently.   Sabrina Wilson 82 y.o.  female patient with ER/PR positive Her 2 Negative  breast cancer stage II T2 N1-currently  adjuvant anastrozole; and verzenio [started second week of June 2024] is here for a follow up.  Diarrhea resolved- self limited. Complains of moderate to severe fatigue which is intermittent.   Her leg swelling is improved overall. Denies any worsening shortness of breath or cough.  Back pain improved on Tylenol.  Denies any blood in the black or stools.  Review of Systems  Constitutional:  Negative for chills, diaphoresis, fever, malaise/fatigue and weight loss.  HENT:  Negative for nosebleeds and sore throat.  Eyes:  Negative for double vision.  Respiratory:  Negative for cough, hemoptysis, sputum production, shortness of breath and wheezing.   Cardiovascular:  Negative for chest pain, palpitations, orthopnea and leg swelling.  Gastrointestinal:  Negative for abdominal pain, blood in stool, constipation, diarrhea, heartburn, melena, nausea and vomiting.  Genitourinary:  Negative for dysuria, frequency and urgency.  Musculoskeletal:  Negative for back pain and joint pain.  Skin: Negative.  Negative for itching and rash.  Neurological:  Negative for dizziness, tingling, focal weakness, weakness and headaches.  Endo/Heme/Allergies:  Does not bruise/bleed easily.  Psychiatric/Behavioral:  Negative for depression. The patient is not nervous/anxious and does not have insomnia.      MEDICAL HISTORY:  Past Medical History:  Diagnosis Date   Actinic keratosis 02/12/2020   R lat calf (hypertrophic)   Arthritis    Cancer of sigmoid (HCC) 06/17/2016   Colon  cancer (HCC) 2009   T3,N0; s/p resection  Dr. Earnestine Leys and chemotherapy by Dr. Neale Burly   Diastolic dysfunction    a. 08/2020 Echo: EF 60-65%, no rwma, Gr1 DD, nl RV size/fxn. Mild MR.   Hernia of flank    History of actinic keratoses 08/11/2020   Bx proven at left lateral ankle proximal, LN2 10/08/20   History of stress test    a. 09/2020 MV: EF >65%. No ischemia/infarct. Mild Ao Ca2+ and minimal Cor Ca2+.   Hypertension    Neuropathy    Polyp at cervical os    s/p resection Dr. Hyacinth Meeker   Skin cancer 01/22/2020   surface of an atypical verrucous squamous proliferation    Squamous cell carcinoma of skin 07/16/2020   Left lateral ankle anterior, treated with [facility] 08-11-2020    SURGICAL HISTORY: Past Surgical History:  Procedure Laterality Date   BREAST BIOPSY Right 05/2014   Dr. Rutherford Nail office-benign   BREAST LUMPECTOMY WITH SENTINEL LYMPH NODE BIOPSY Right 01/21/2022   Procedure: BREAST LUMPECTOMY WITH SENTINEL LYMPH NODE BX;  Surgeon: [name], MD;  Location: [facility] ORS;  Service: General;  Laterality: Right;   BREAST SURGERY Right February 2016   Vacuum assisted biopsy for recurrent cyst, fibrocystic changes without evidence of malignancy.   CATARACT EXTRACTION W/PHACO Left 01/13/2015   Procedure: CATARACT EXTRACTION PHACO AND INTRAOCULAR LENS PLACEMENT (IOC);  Surgeon: Galen Manila, MD;  Location: [facility] ORS;  Service: Ophthalmology;  Laterality: Left;  [place]  1:02.9 AP   19.2 CDE  12.06 casette lot #  [id number]   CATARACT EXTRACTION W/PHACO Right 02/03/2015   Procedure: CATARACT EXTRACTION PHACO AND INTRAOCULAR LENS PLACEMENT (IOC);  Surgeon: Galen Manila, MD;  Location: [facility] ORS;  Service: Ophthalmology;  Laterality: Right;  [id number]:53 ap46.5 [id number]   COLECTOMY     COLONOSCOPY  2015   Dr Bluford Kaufmann   COLONOSCOPY WITH PROPOFOL N/A 09/13/2017   Procedure: COLONOSCOPY WITH PROPOFOL;  Surgeon: [name], MD;  Location: [facility] ENDOSCOPY;  Service: Endoscopy;  Laterality: N/A;    COLONOSCOPY WITH PROPOFOL N/A 11/08/2017   Procedure: COLONOSCOPY WITH PROPOFOL;  Surgeon: [name], MD;  Location: [facility] ENDOSCOPY;  Service: Endoscopy;  Laterality: N/A;   colostomy reversal     DILATION AND CURETTAGE OF UTERUS  2014   Dr. Hyacinth Meeker, benign per pt   EYE SURGERY     HERNIA REPAIR  07/13/12   ventral    SKIN CANCER EXCISION     TONSILLECTOMY      SOCIAL HISTORY: Social History   Socioeconomic History   Marital status: Married  Spouse name: Not on file   Number of children: Not on file   Years of education: Not on file   Highest education level: Not on file  Occupational History   Not on file  Tobacco Use   Smoking status: Former    Current packs/day: 0.00    Types: Cigarettes    Quit date: 10/12/2007    Years since quitting: 15.1   Smokeless tobacco: Never  Vaping Use   Vaping status: Never Used  Substance and Sexual Activity   Alcohol use: Yes    Alcohol/week: 1.0 standard drink of alcohol    Types: 1 Standard drinks or equivalent per week    Comment: with dinner GLASS OF WINE EACH DAY   Drug use: No   Sexual activity: Not on file  Other Topics Concern   Not on file  Social History Narrative   Lives in [place] with husband. 2 children, son lives nearby.Diet - regularExercise - none. 30 mins/in [place]. Quit smoking in 2009. Wine before dinner. Pianist in church; real estate; Diplomatic Services operational officer.    Social Determinants of Health   Financial Resource Strain: Low Risk  (05/19/2022)   Overall Financial Resource Strain (CARDIA)    Difficulty of Paying Living Expenses: Not very hard  Food Insecurity: No Food Insecurity (06/03/2022)   Hunger Vital Sign    Worried About Running Out of Food in the Last Year: Never true    Ran Out of Food in the Last Year: Never true  Transportation Needs: No Transportation Needs (05/28/2022)   PRAPARE - Administrator, Civil Service (Medical): No    Lack of Transportation (Non-Medical): No  Physical Activity:  Not on file  Stress: No Stress Concern Present (11/16/2020)   [employer] of Occupational Health - Occupational Stress Questionnaire    Feeling of Stress : Not at all  Social Connections: Not on file  Intimate Partner Violence: Not At Risk (05/28/2022)   Humiliation, Afraid, Rape, and Kick questionnaire    Fear of Current or Ex-Partner: No    Emotionally Abused: No    Physically Abused: No    Sexually Abused: No    FAMILY HISTORY: Family History  Problem Relation Age of Onset   Stroke Mother    Diabetes Mother    Colon cancer Father        dx 85s   Stroke Sister    Colon cancer Brother        dx 67s-70s   Brain cancer Brother        dx 60s-70s    ALLERGIES:  is allergic to sulfa antibiotics.  MEDICATIONS:  Current Outpatient Medications  Medication Sig Dispense Refill   abemaciclib (VERZENIO) 100 MG tablet Take 1 tablet (100 mg total) by mouth 2 (two) times daily. 56 tablet 0   anastrozole (ARIMIDEX) 1 MG tablet TAKE 1 TABLET BY MOUTH DAILY 90 tablet 0   apixaban (ELIQUIS) 5 MG TABS tablet Two tabs po twice a day for five days then 1 tab po twice a day afterwards 60 tablet 4   atorvastatin (LIPITOR) 10 MG tablet TAKE 1 TABLET BY MOUTH DAILY 90 tablet 1   Calcium Carbonate-Vit D-Min (CALCIUM 1200 PO) Take 1 tablet by mouth daily.     guaiFENesin (MUCINEX) 600 MG 12 hr tablet Take 1 tablet (600 mg total) by mouth 2 (two) times daily as needed for cough or to loosen phlegm. 10 tablet 0   halobetasol (ULTRAVATE) 0.05 % cream Apply topically to aa's  of rash at body twice daily on weekends only as needed. Avoid applying to face, groin, and axilla. Use as directed. 50 g 1   Multiple Vitamins-Minerals (PRESERVISION AREDS 2 PO)      niacinamide 500 MG tablet Take 500 mg by mouth 2 (two) times daily.     Omega-3 Fatty Acids (FISH OIL PO) Take 1 capsule by mouth daily.     tacrolimus (PROTOPIC) 0.1 % ointment Apply topically to affected areas of rash twice daily on week days only  prn 60 g 1   VITAMIN D, CHOLECALCIFEROL, PO Take 2,000 Units by mouth daily.     albuterol (VENTOLIN HFA) 108 (90 Base) MCG/ACT inhaler Inhale 2 puffs into the lungs every 6 (six) hours as needed for wheezing or shortness of breath. (Patient not taking: Reported on 11/16/2022) 8 g 2   umeclidinium bromide (INCRUSE ELLIPTA) 62.5 MCG/ACT AEPB Inhale 1 puff into the lungs daily. (Patient not taking: Reported on 11/16/2022) 30 each 0   No current facility-administered medications for this visit.      Marland Kitchen  PHYSICAL EXAMINATION:   Vitals:   11/16/22 1441  BP: (!) 184/81  Pulse: 80  Resp: 16  Temp: 98 F (36.7 C)  SpO2: 99%    Filed Weights   11/16/22 1441  Weight: 182 lb (82.6 kg)     Physical Exam Vitals and nursing note reviewed.  HENT:     Head: Normocephalic and atraumatic.     Mouth/Throat:     Pharynx: Oropharynx is clear.  Eyes:     Extraocular Movements: Extraocular movements intact.     Pupils: Pupils are equal, round, and reactive to light.  Cardiovascular:     Rate and Rhythm: Normal rate and regular rhythm.  Pulmonary:     Comments: Decreased breath sounds bilaterally.  Abdominal:     Palpations: Abdomen is soft.  Musculoskeletal:        General: Normal range of motion.     Cervical back: Normal range of motion.  Skin:    General: Skin is warm.  Neurological:     General: No focal deficit present.     Mental Status: She is alert and oriented to person, place, and time.  Psychiatric:        Behavior: Behavior normal.        Judgment: Judgment normal.      LABORATORY DATA:  I have reviewed the data as listed Lab Results  Component Value Date   WBC 3.3 (L) 11/16/2022   HGB 10.1 (L) 11/16/2022   HCT 30.7 (L) 11/16/2022   MCV 103.4 (H) 11/16/2022   PLT 172 11/16/2022   Recent Labs    08/10/22 1416 10/14/22 1402 11/16/22 1433  NA 136 137 136  K 3.6 3.9 3.9  CL 102 103 103  CO2 26 25 26   GLUCOSE 136* 141* 112*  BUN 19 25* 25*  CREATININE  0.88 1.27* 1.14*  CALCIUM 9.8 10.0 9.6  GFRNONAA >60 43* 49*  PROT 6.7 6.8 6.5  ALBUMIN 4.1 4.1 4.1  AST 24 19 19   ALT 15 13 13   ALKPHOS 64 64 51  BILITOT 0.5 1.0 0.6    RADIOGRAPHIC STUDIES: I have personally reviewed the radiological images as listed and agreed with the findings in the report. No results found.  ASSESSMENT & PLAN:   Carcinoma of overlapping sites of right breast in female, estrogen receptor positive (HCC) # Right breast cancer -[facility]; ER/PR positive Her 2 Negative ;  G-3; LVI +  T2N1.  Stage II [OCT 2023; Dr.Byrnett] s/p Lumpectomy; node positive-Oncotype RS- 35- s/p  adjuvant chemotherapy with Taxotere-Cytoxan every 3 weeks x4 cycles. Consider abema x 2 years/zometa.   S/p postlumpectomy radiation April 3rd 2024.    # Currently on anastrozole once a day [April 2024]-tolerating well without any major side effects.    # on  adjuvant abemaciclib 100 mg BID [started second week of june]-tolerating except for fatigue.  care program referral re: fatigue. Labs-CBC/chemistries were reviewed with the patient.  # [2nd,FEB 2024] CTA-  acute pulmonary embolism, with new segmental to subsegmental embolus present in the right lower lobe/right lower lobe-pulmonary infarct. LEFT LE- Acute DVT-on anticoagulation with eliquis on 5 mg BID X 6 months- [provoked sec to chemo]. Stop eliquis in End of  AUG 2024.    # Bone health-2023 osteopenic T-score of -1.1.Continue calcium plus vitamin D.- stable.  I discussed the role of adjuvant Zometa every 6 months x 3 years. Awaiting dental evaluation [[facility] vs others- will send referral if needed].  Continue ca+Vit D BID.   # Genetic testing: brother-& father- colon cancer; other brother- brain tumor; no breast cancers in family. pending genetic blood work today.   # Right arm decreased range of motion/swelling- ?  Lymphedema- s/p evaluation- Maureen/OT- stable.   # IV Access: PIV   # DISPOSITION: # care program referral re: fatigue # follow in  4 weeks-  MD;labs- cbc/cmp;vit D 25-OH- Dr.B   All questions were answered. The patient/family knows to call the clinic with any problems, questions or concerns.    [name], MD 11/16/2022 4:15 PM

## 2022-11-16 NOTE — Progress Notes (Signed)
Patient expresses concerns about tiredness on verzenio.

## 2022-11-17 ENCOUNTER — Other Ambulatory Visit (HOSPITAL_COMMUNITY): Payer: Self-pay

## 2022-11-17 ENCOUNTER — Other Ambulatory Visit: Payer: Self-pay

## 2022-11-17 MED ORDER — ABEMACICLIB 100 MG PO TABS
100.0000 mg | ORAL_TABLET | Freq: Two times a day (BID) | ORAL | 0 refills | Status: DC
Start: 2022-11-17 — End: 2022-12-14
  Filled 2022-11-17: qty 56, 28d supply, fill #0

## 2022-11-21 ENCOUNTER — Other Ambulatory Visit: Payer: Self-pay

## 2022-11-21 ENCOUNTER — Ambulatory Visit (INDEPENDENT_AMBULATORY_CARE_PROVIDER_SITE_OTHER): Payer: Medicare Other | Admitting: *Deleted

## 2022-11-21 VITALS — Ht 66.0 in | Wt 180.0 lb

## 2022-11-21 DIAGNOSIS — Z0000 Encounter for general adult medical examination without abnormal findings: Secondary | ICD-10-CM

## 2022-11-21 NOTE — Progress Notes (Signed)
Subjective:   Sabrina Wilson is a 82 y.o. female who presents for Medicare Annual (Subsequent) preventive examination.  Visit Complete: Virtual  I connected with  [name] on 11/21/22 by a audio enabled telemedicine application and verified that I am speaking with the correct person using two identifiers.  Patient Location: Home  Provider Location: Office/Clinic  I discussed the limitations of evaluation and management by telemedicine. The patient expressed understanding and agreed to proceed.  Vital Signs: Unable to obtain new vitals due to this being a telehealth visit.   Review of Systems     Cardiac Risk Factors include: advanced age (>66men, >67 women);dyslipidemia;hypertension     Objective:    Today's Vitals   11/21/22 1535  Weight: 180 lb (81.6 kg)  Height: 5\' 6"  (1.676 m)   Body mass index is 29.05 kg/m.     11/21/2022    4:00 PM 10/14/2022    2:21 PM 09/21/2022    3:10 PM 08/29/2022   11:09 AM 08/10/2022    2:34 PM 06/29/2022    2:59 PM 06/08/2022   10:57 AM  Advanced Directives  Does Patient Have a Medical Advance Directive? Yes Yes Yes Yes Yes Yes Yes  Type of Estate agent of [place];Living will  Healthcare Power of [place];Living will Healthcare Power of [place];Living will Healthcare Power of [place];Living will Healthcare Power of [place];Living will Healthcare Power of [place];Living will  Does patient want to make changes to medical advance directive?    No - Patient declined  No - Patient declined No - Patient declined  Copy of Healthcare Power of Attorney in Chart? No - copy requested   No - copy requested No - copy requested No - copy requested No - copy requested  Would patient like information on creating a medical advance directive?    No - Patient declined       Current Medications (verified) Outpatient Encounter Medications as of 11/21/2022  Medication Sig   abemaciclib (VERZENIO) 100 MG tablet Take 1 tablet  (100 mg total) by mouth 2 (two) times daily.   anastrozole (ARIMIDEX) 1 MG tablet TAKE 1 TABLET BY MOUTH DAILY   apixaban (ELIQUIS) 5 MG TABS tablet Two tabs po twice a day for five days then 1 tab po twice a day afterwards   atorvastatin (LIPITOR) 10 MG tablet TAKE 1 TABLET BY MOUTH DAILY   Calcium Carbonate-Vit D-Min (CALCIUM 1200 PO) Take 1 tablet by mouth daily.   guaiFENesin (MUCINEX) 600 MG 12 hr tablet Take 1 tablet (600 mg total) by mouth 2 (two) times daily as needed for cough or to loosen phlegm.   halobetasol (ULTRAVATE) 0.05 % cream Apply topically to aa's of rash at body twice daily on weekends only as needed. Avoid applying to face, groin, and axilla. Use as directed.   Multiple Vitamins-Minerals (PRESERVISION AREDS 2 PO)    niacinamide 500 MG tablet Take 500 mg by mouth 2 (two) times daily.   Omega-3 Fatty Acids (FISH OIL PO) Take 1 capsule by mouth daily.   tacrolimus (PROTOPIC) 0.1 % ointment Apply topically to affected areas of rash twice daily on week days only prn   umeclidinium bromide (INCRUSE ELLIPTA) 62.5 MCG/ACT AEPB Inhale 1 puff into the lungs daily.   VITAMIN D, CHOLECALCIFEROL, PO Take 2,000 Units by mouth daily.   albuterol (VENTOLIN HFA) 108 (90 Base) MCG/ACT inhaler Inhale 2 puffs into the lungs every 6 (six) hours as needed for wheezing or shortness of breath. (  Patient not taking: Reported on 11/21/2022)   No facility-administered encounter medications on file as of 11/21/2022.    Allergies (verified) Sulfa antibiotics   History: Past Medical History:  Diagnosis Date   Actinic keratosis 02/12/2020   R lat calf (hypertrophic)   Arthritis    Cancer of sigmoid (HCC) 06/17/2016   Colon cancer (HCC) 2009   T3,N0; s/p resection  Dr. Earnestine Leys and chemotherapy by Dr. Neale Burly   Diastolic dysfunction    a. 08/2020 Echo: EF 60-65%, no rwma, Gr1 DD, nl RV size/fxn. Mild MR.   Hernia of flank    History of actinic keratoses 08/11/2020   Bx proven at left lateral ankle  proximal, LN2 10/08/20   History of stress test    a. 09/2020 MV: EF >65%. No ischemia/infarct. Mild Ao Ca2+ and minimal Cor Ca2+.   Hypertension    Neuropathy    Polyp at cervical os    s/p resection Dr. Hyacinth Meeker   Skin cancer 01/22/2020   surface of an atypical verrucous squamous proliferation    Squamous cell carcinoma of skin 07/16/2020   Left lateral ankle anterior, treated with [facility] 08-11-2020   Past Surgical History:  Procedure Laterality Date   BREAST BIOPSY Right 05/2014   Dr. Rutherford Nail office-benign   BREAST LUMPECTOMY WITH SENTINEL LYMPH NODE BIOPSY Right 01/21/2022   Procedure: BREAST LUMPECTOMY WITH SENTINEL LYMPH NODE BX;  Surgeon: [name], MD;  Location: [facility] ORS;  Service: General;  Laterality: Right;   BREAST SURGERY Right February 2016   Vacuum assisted biopsy for recurrent cyst, fibrocystic changes without evidence of malignancy.   CATARACT EXTRACTION W/PHACO Left 01/13/2015   Procedure: CATARACT EXTRACTION PHACO AND INTRAOCULAR LENS PLACEMENT (IOC);  Surgeon: Galen Manila, MD;  Location: [facility] ORS;  Service: Ophthalmology;  Laterality: Left;  [place]  1:02.9 AP   19.2 CDE  12.06 casette lot #  [id number]   CATARACT EXTRACTION W/PHACO Right 02/03/2015   Procedure: CATARACT EXTRACTION PHACO AND INTRAOCULAR LENS PLACEMENT (IOC);  Surgeon: Galen Manila, MD;  Location: [facility] ORS;  Service: Ophthalmology;  Laterality: Right;  [id number] ap46.5 [id number]   COLECTOMY     COLONOSCOPY  2015   Dr Bluford Kaufmann   COLONOSCOPY WITH PROPOFOL N/A 09/13/2017   Procedure: COLONOSCOPY WITH PROPOFOL;  Surgeon: [name], MD;  Location[facility] ENDOSCOPY;  Service: Endoscopy;  Laterality: N/A;   COLONOSCOPY WITH PROPOFOL N/A 11/08/2017   Procedure: COLONOSCOPY WITH PROPOFOL;  Surgeon: [name], MD;  Location: [facility] ENDOSCOPY;  Service: Endoscopy;  Laterality: N/A;   colostomy reversal     DILATION AND CURETTAGE OF UTERUS  2014   Dr. Hyacinth Meeker, benign per pt   EYE SURGERY     HERNIA  REPAIR  07/13/12   ventral    SKIN CANCER EXCISION     TONSILLECTOMY     Family History  Problem Relation Age of Onset   Stroke Mother    Diabetes Mother    Colon cancer Father        dx 45s   Stroke Sister    Colon cancer Brother        dx 12s-70s   Brain cancer Brother        dx 55s-70s   Social History   Socioeconomic History   Marital status: Married    Spouse name: Not on file   Number of children: Not on file   Years of education: Not on file   Highest education level: Not on file  Occupational History  Not on file  Tobacco Use   Smoking status: Former    Current packs/day: 0.00    Types: Cigarettes    Quit date: 10/12/2007    Years since quitting: 15.1   Smokeless tobacco: Never  Vaping Use   Vaping status: Never Used  Substance and Sexual Activity   Alcohol use: Yes    Alcohol/week: 1.0 standard drink of alcohol    Types: 1 Standard drinks or equivalent per week    Comment: with dinner GLASS OF WINE EACH DAY   Drug use: No   Sexual activity: Not on file  Other Topics Concern   Not on file  Social History Narrative   Lives in [place] with husband. 2 children, son lives nearby.Diet - regularExercise - none. 30 mins/in [place]. Quit smoking in 2009. Wine before dinner. Pianist in church; real estate; Diplomatic Services operational officer.    Social Determinants of Health   Financial Resource Strain: Low Risk  (11/21/2022)   Overall Financial Resource Strain (CARDIA)    Difficulty of Paying Living Expenses: Not hard at all  Food Insecurity: No Food Insecurity (11/21/2022)   Hunger Vital Sign    Worried About Running Out of Food in the Last Year: Never true    Ran Out of Food in the Last Year: Never true  Transportation Needs: No Transportation Needs (11/21/2022)   PRAPARE - Administrator, Civil Service (Medical): No    Lack of Transportation (Non-Medical): No  Physical Activity: Inactive (11/21/2022)   Exercise Vital Sign    Days of Exercise per Week: 0 days     Minutes of Exercise per Session: 0 min  Stress: No Stress Concern Present (11/21/2022)   [employer] of Occupational Health - Occupational Stress Questionnaire    Feeling of Stress : Not at all  Social Connections: Socially Integrated (11/21/2022)   Social Connection and Isolation Panel [NHANES]    Frequency of Communication with Friends and Family: More than three times a week    Frequency of Social Gatherings with Friends and Family: More than three times a week    Attends Religious Services: More than 4 times per year    Active Member of [employer] or Organizations: Yes    Attends Engineer, structural: More than 4 times per year    Marital Status: Married    Tobacco Counseling Counseling given: Not Answered   Clinical Intake:  Pre-visit preparation completed: Yes  Pain : No/denies pain     BMI - recorded: 29.05 Nutritional Status: BMI 25 -29 Overweight Nutritional Risks: None (nausea due to new medication) Diabetes: No  How often do you need to have someone help you when you read instructions, pamphlets, or other written materials from your doctor or pharmacy?: 1 - Never  Interpreter Needed?: No  Information entered by :: R. Aveline Daus LPN   Activities of Daily Living    11/21/2022    3:39 PM 05/28/2022    6:03 PM  In your present state of health, do you have any difficulty performing the following activities:  Hearing? 0   Vision? 1   Comment needs eye surgery   Difficulty concentrating or making decisions? 0   Walking or climbing stairs? 0   Dressing or bathing? 0   Doing errands, shopping? 0 0  Preparing Food and eating ? N   Using the Toilet? N   In the past six months, have you accidently leaked urine? N   Do you have problems with loss of bowel  control? N   Managing your Medications? N   Managing your Finances? N   Housekeeping or managing your Housekeeping? N     Patient Care Team: [name], MD as PCP - General (Family  Medicine) [name], MD as PCP - Cardiology (Cardiology) [name], [name], MD as Consulting Physician (General Surgery) [name], MD (Internal Medicine) [name], RN as Oncology Nurse Navigator [name], MD as Consulting Physician (Radiation Oncology) [name], MD as Consulting Physician (Internal Medicine) Sung Amabile, DO as Consulting Physician (General Surgery)  Indicate any recent Medical Services you may have received from other than [facility] providers in the past year (date may be approximate).     Assessment:   This is a routine wellness examination for [place].  Hearing/Vision screen Hearing Screening - Comments:: No issues Vision Screening - Comments:: Needs eye surgery  Dietary issues and exercise activities discussed:     Goals Addressed             This Visit's Progress    Patient Stated       Patient Stated       Going to try to do an exercise class       Depression Screen    11/21/2022    3:53 PM 06/03/2022   10:49 AM 02/11/2022    3:40 PM 11/24/2021   10:36 AM 05/24/2021    2:26 PM 11/17/2020   12:46 PM 11/16/2020    1:24 PM  PHQ 2/9 Scores  PHQ - 2 Score 0 0 0 0 0 0 0  PHQ- 9 Score 3          Fall Risk    11/21/2022    3:42 PM 06/03/2022   10:48 AM 11/24/2021   10:36 AM 05/24/2021    2:26 PM 11/16/2020    1:47 PM  Fall Risk   Falls in the past year? 1 0 0 0 0  Number falls in past yr: 0 0 0 0 0  Injury with Fall? 0 0 0 0 0  Risk for fall due to : History of fall(s);Impaired balance/gait Impaired balance/gait No Fall Risks No Fall Risks   Follow up Falls evaluation completed;Education provided Falls evaluation completed Falls evaluation completed Falls evaluation completed Falls evaluation completed    MEDICARE RISK AT HOME:  Medicare Risk at Home - 11/21/22 1544     Any stairs in or around the home? Yes    If so, are there any without handrails? No    Home free of loose throw rugs in walkways, pet beds,  electrical cords, etc? No   disucss getting a different type rug   Adequate lighting in your home to reduce risk of falls? Yes    Life alert? No    Use of a cane, walker or w/c? No    Grab bars in the bathroom? Yes    Shower chair or bench in shower? No    Elevated toilet seat or a handicapped toilet? No             Cognitive Function:    11/14/2019   11:25 AM  MMSE - Mini Mental State Exam  Not completed: Unable to complete        11/21/2022    4:00 PM 11/13/2018   11:18 AM  6CIT Screen  What Year? 0 points 0 points  What month? 0 points 0 points  What time? 0 points 0 points  Count back from 20 0 points  0 points  Months in reverse 4 points 0 points  Repeat phrase 0 points 0 points  Total Score 4 points 0 points    Immunizations Immunization History  Administered Date(s) Administered   Fluad Quad(high Dose 65+) 12/19/2018, 12/21/2018, 01/14/2020, 01/12/2022   Hepb-cpg 12/11/2019, 01/14/2020   Influenza Split 01/19/2011, 03/26/2012   Influenza, High Dose Seasonal PF 02/03/2014, 02/07/2017, 01/31/2018, 01/18/2021   Influenza,inj,Quad PF,6+ Mos 02/07/2013, 01/22/2015, 02/01/2016   Influenza-Unspecified 02/06/2014   Moderna Covid-19 Vaccine Bivalent Booster 61yrs & up 01/18/2021, 09/01/2021, 01/23/2022   Moderna Sars-Covid-2 Vaccination 05/03/2019, 05/31/2019, 02/17/2020, 08/11/2020   Pneumococcal Conjugate-13 09/27/2013   Pneumococcal Polysaccharide-23 04/12/2009   Respiratory Syncytial Virus Vaccine,Recomb Aduvanted(Arexvy) 01/12/2022   Tdap 11/26/2019   Zoster Recombinant(Shingrix) 06/11/2018, 08/30/2018   Zoster, Live 02/03/2011    TDAP status: Up to date  Flu Vaccine status: Up to date  Pneumococcal vaccine status: Up to date  Covid-19 vaccine status: Completed vaccines  Qualifies for Shingles Vaccine? Yes   Zostavax completed Yes   Shingrix Completed?: Yes  Screening Tests Health Maintenance  Topic Date Due   Medicare Annual Wellness (AWV)   11/18/2022   COVID-19 Vaccine (8 - 2023-24 season) 01/24/2023 (Originally 03/20/2022)   INFLUENZA VACCINE  11/24/2022   DTaP/Tdap/Td (2 - Td or Tdap) 11/25/2029   Pneumonia Vaccine 71+ Years old  Completed   DEXA SCAN  Completed   Hepatitis C Screening  Completed   Zoster Vaccines- Shingrix  Completed   HPV VACCINES  Aged Out   Colonoscopy  Discontinued    Health Maintenance  Health Maintenance Due  Topic Date Due   Medicare Annual Wellness (AWV)  11/18/2022    Colorectal cancer screening: Type of screening: Colonoscopy. Completed 7/19. Repeat every 5 years Patient is to contact GI   Mammogram Completed 9/23, diagnosed with breast cancer  Bone Density status: Completed 3/24. Results reflect: Bone density results: OSTEOPOROSIS. Repeat every 2 years.  Lung Cancer Screening: (Low Dose CT Chest recommended if Age 83-80 years, 20 pack-year currently smoking OR have quit w/in 15years.) does not qualify.     Additional Screening:  Hepatitis C Screening: does not qualify; Completed 8/21  Vision Screening: Recommended annual ophthalmology exams for early detection of glaucoma and other disorders of the eye. Is the patient up to date with their annual eye exam?  Yes  Who is the provider or what is the name of the office in which the patient attends annual eye exams? [facility] If pt is not established with a provider, would they like to be referred to a provider to establish care? No .   Dental Screening: Recommended annual dental exams for proper oral hygiene    Community Resource Referral / Chronic Care Management: CRR required this visit?  No   CCM required this visit?  No     Plan:     I have personally reviewed and noted the following in the patient's chart:   Medical and social history Use of alcohol, tobacco or illicit drugs  Current medications and supplements including opioid prescriptions. Patient is not currently taking opioid prescriptions. Functional  ability and status Nutritional status Physical activity Advanced directives List of other physicians Hospitalizations, surgeries, and ER visits in previous 12 months Vitals Screenings to include cognitive, depression, and falls Referrals and appointments  In addition, I have reviewed and discussed with patient certain preventive protocols, quality metrics, and best practice recommendations. A written personalized care plan for preventive services as well as general preventive health recommendations were  provided to patient.     Sydell Axon, LPN   08/01/8117   After Visit Summary: (MyChart) Due to this being a telephonic visit, the after visit summary with patients personalized plan was offered to patient via MyChart   Nurse Notes: None

## 2022-11-21 NOTE — Patient Instructions (Signed)
Sabrina Wilson , Thank you for taking time to come for your Medicare Wellness Visit. I appreciate your ongoing commitment to your health goals. Please review the following plan we discussed and let me know if I can assist you in the future.   Referrals/Orders/Follow-Ups/Clinician Recommendations: None  This is a list of the screening recommended for you and due dates:  Health Maintenance  Topic Date Due   COVID-19 Vaccine (8 - 2023-24 season) 01/24/2023*   Flu Shot  11/24/2022   Mammogram  01/12/2023   Medicare Annual Wellness Visit  11/21/2023   DTaP/Tdap/Td vaccine (2 - Td or Tdap) 11/25/2029   Pneumonia Vaccine  Completed   DEXA scan (bone density measurement)  Completed   Hepatitis C Screening  Completed   Zoster (Shingles) Vaccine  Completed   HPV Vaccine  Aged Out   Colon Cancer Screening  Discontinued  *Topic was postponed. The date shown is not the original due date.    Advanced directives: (Copy Requested) Please bring a copy of your health care power of attorney and living will to the office to be added to your chart at your convenience.  Next Medicare Annual Wellness Visit scheduled for next year: Yes 11/27/23 @ 1:30  Preventive Care 65 Years and Older, Female Preventive care refers to lifestyle choices and visits with your health care provider that can promote health and wellness. What does preventive care include? A yearly physical exam. This is also called an annual well check. Dental exams once or twice a year. Routine eye exams. Ask your health care provider how often you should have your eyes checked. Personal lifestyle choices, including: Daily care of your teeth and gums. Regular physical activity. Eating a healthy diet. Avoiding tobacco and drug use. Limiting alcohol use. Practicing safe sex. Taking low-dose aspirin every day. Taking vitamin and mineral supplements as recommended by your health care provider. What happens during an annual well check? The  services and screenings done by your health care provider during your annual well check will depend on your age, overall health, lifestyle risk factors, and family history of disease. Counseling  Your health care provider may ask you questions about your: Alcohol use. Tobacco use. Drug use. Emotional well-being. Home and relationship well-being. Sexual activity. Eating habits. History of falls. Memory and ability to understand (cognition). Work and work Astronomer. Reproductive health. Screening  You may have the following tests or measurements: Height, weight, and BMI. Blood pressure. Lipid and cholesterol levels. These may be checked every 5 years, or more frequently if you are over 60 years old. Skin check. Lung cancer screening. You may have this screening every year starting at age 37 if you have a 30-pack-year history of smoking and currently smoke or have quit within the past 15 years. Fecal occult blood test (FOBT) of the stool. You may have this test every year starting at age 46. Flexible sigmoidoscopy or colonoscopy. You may have a sigmoidoscopy every 5 years or a colonoscopy every 10 years starting at age 8. Hepatitis C blood test. Hepatitis B blood test. Sexually transmitted disease (STD) testing. Diabetes screening. This is done by checking your blood sugar (glucose) after you have not eaten for a while (fasting). You may have this done every 1-3 years. Bone density scan. This is done to screen for osteoporosis. You may have this done starting at age 61. Mammogram. This may be done every 1-2 years. Talk to your health care provider about how often you should have regular mammograms. Talk  with your health care provider about your test results, treatment options, and if necessary, the need for more tests. Vaccines  Your health care provider may recommend certain vaccines, such as: Influenza vaccine. This is recommended every year. Tetanus, diphtheria, and acellular  pertussis (Tdap, Td) vaccine. You may need a Td booster every 10 years. Zoster vaccine. You may need this after age 52. Pneumococcal 13-valent conjugate (PCV13) vaccine. One dose is recommended after age 52. Pneumococcal polysaccharide (PPSV23) vaccine. One dose is recommended after age 102. Talk to your health care provider about which screenings and vaccines you need and how often you need them. This information is not intended to replace advice given to you by your health care provider. Make sure you discuss any questions you have with your health care provider. Document Released: 05/08/2015 Document Revised: 12/30/2015 Document Reviewed: 02/10/2015 Elsevier Interactive Patient Education  2017 [employer].  Fall Prevention in the Home Falls can cause injuries. They can happen to people of all ages. There are many things you can do to make your home safe and to help prevent falls. What can I do on the outside of my home? Regularly fix the edges of walkways and driveways and fix any cracks. Remove anything that might make you trip as you walk through a door, such as a raised step or threshold. Trim any bushes or trees on the path to your home. Use bright outdoor lighting. Clear any walking paths of anything that might make someone trip, such as rocks or tools. Regularly check to see if handrails are loose or broken. Make sure that both sides of any steps have handrails. Any raised decks and porches should have guardrails on the edges. Have any leaves, snow, or ice cleared regularly. Use sand or salt on walking paths during winter. Clean up any spills in your garage right away. This includes oil or grease spills. What can I do in the bathroom? Use night lights. Install grab bars by the toilet and in the tub and shower. Do not use towel bars as grab bars. Use non-skid mats or decals in the tub or shower. If you need to sit down in the shower, use a plastic, non-slip stool. Keep the floor  dry. Clean up any water that spills on the floor as soon as it happens. Remove soap buildup in the tub or shower regularly. Attach bath mats securely with double-sided non-slip rug tape. Do not have throw rugs and other things on the floor that can make you trip. What can I do in the bedroom? Use night lights. Make sure that you have a light by your bed that is easy to reach. Do not use any sheets or blankets that are too big for your bed. They should not hang down onto the floor. Have a firm chair that has side arms. You can use this for support while you get dressed. Do not have throw rugs and other things on the floor that can make you trip. What can I do in the kitchen? Clean up any spills right away. Avoid walking on wet floors. Keep items that you use a lot in easy-to-reach places. If you need to reach something above you, use a strong step stool that has a grab bar. Keep electrical cords out of the way. Do not use floor polish or wax that makes floors slippery. If you must use wax, use non-skid floor wax. Do not have throw rugs and other things on the floor that can make you  trip. What can I do with my stairs? Do not leave any items on the stairs. Make sure that there are handrails on both sides of the stairs and use them. Fix handrails that are broken or loose. Make sure that handrails are as long as the stairways. Check any carpeting to make sure that it is firmly attached to the stairs. Fix any carpet that is loose or worn. Avoid having throw rugs at the top or bottom of the stairs. If you do have throw rugs, attach them to the floor with carpet tape. Make sure that you have a light switch at the top of the stairs and the bottom of the stairs. If you do not have them, ask someone to add them for you. What else can I do to help prevent falls? Wear shoes that: Do not have high heels. Have rubber bottoms. Are comfortable and fit you well. Are closed at the toe. Do not wear  sandals. If you use a stepladder: Make sure that it is fully opened. Do not climb a closed stepladder. Make sure that both sides of the stepladder are locked into place. Ask someone to hold it for you, if possible. Clearly mark and make sure that you can see: Any grab bars or handrails. First and last steps. Where the edge of each step is. Use tools that help you move around (mobility aids) if they are needed. These include: Canes. Walkers. Scooters. Crutches. Turn on the lights when you go into a dark area. Replace any light bulbs as soon as they burn out. Set up your furniture so you have a clear path. Avoid moving your furniture around. If any of your floors are uneven, fix them. If there are any pets around you, be aware of where they are. Review your medicines with your doctor. Some medicines can make you feel dizzy. This can increase your chance of falling. Ask your doctor what other things that you can do to help prevent falls. This information is not intended to replace advice given to you by your health care provider. Make sure you discuss any questions you have with your health care provider. Document Released: 02/05/2009 Document Revised: 09/17/2015 Document Reviewed: 05/16/2014 Elsevier Interactive Patient Education  2017 [employer].

## 2022-11-22 ENCOUNTER — Other Ambulatory Visit: Payer: Self-pay

## 2022-11-28 ENCOUNTER — Other Ambulatory Visit: Payer: Self-pay | Admitting: Internal Medicine

## 2022-12-14 ENCOUNTER — Other Ambulatory Visit (HOSPITAL_COMMUNITY): Payer: Self-pay

## 2022-12-14 ENCOUNTER — Other Ambulatory Visit: Payer: Self-pay | Admitting: Internal Medicine

## 2022-12-14 DIAGNOSIS — Z170 Estrogen receptor positive status [ER+]: Secondary | ICD-10-CM

## 2022-12-15 ENCOUNTER — Other Ambulatory Visit: Payer: Self-pay

## 2022-12-15 ENCOUNTER — Other Ambulatory Visit (HOSPITAL_COMMUNITY): Payer: Self-pay

## 2022-12-15 MED ORDER — ABEMACICLIB 100 MG PO TABS
100.0000 mg | ORAL_TABLET | Freq: Two times a day (BID) | ORAL | 0 refills | Status: DC
Start: 2022-12-15 — End: 2023-05-03
  Filled 2022-12-15: qty 56, 28d supply, fill #0

## 2022-12-16 ENCOUNTER — Other Ambulatory Visit (HOSPITAL_COMMUNITY): Payer: Self-pay

## 2022-12-21 ENCOUNTER — Ambulatory Visit: Payer: Medicare Other | Admitting: Dermatology

## 2022-12-28 ENCOUNTER — Telehealth: Payer: Self-pay | Admitting: *Deleted

## 2022-12-28 NOTE — Telephone Encounter (Signed)
Patient called stating that her COPD is getting really bad and she called Dr Barnie Del office and was told that she would need a referral back to him and is asking if Dr B would please refer her to Dr Nolon Stalls.

## 2022-12-29 ENCOUNTER — Other Ambulatory Visit: Payer: Self-pay

## 2022-12-29 ENCOUNTER — Encounter: Payer: Self-pay | Admitting: Internal Medicine

## 2022-12-29 DIAGNOSIS — R197 Diarrhea, unspecified: Secondary | ICD-10-CM

## 2022-12-29 DIAGNOSIS — Z170 Estrogen receptor positive status [ER+]: Secondary | ICD-10-CM

## 2022-12-29 NOTE — Telephone Encounter (Signed)
Patient informed that referral has been placed.

## 2022-12-29 NOTE — Telephone Encounter (Signed)
Referral placed.

## 2023-01-02 ENCOUNTER — Encounter: Payer: Self-pay | Admitting: Dermatology

## 2023-01-02 ENCOUNTER — Ambulatory Visit: Payer: Medicare Other | Admitting: Dermatology

## 2023-01-02 VITALS — BP 134/82 | HR 81

## 2023-01-02 DIAGNOSIS — Z1283 Encounter for screening for malignant neoplasm of skin: Secondary | ICD-10-CM | POA: Diagnosis not present

## 2023-01-02 DIAGNOSIS — L814 Other melanin hyperpigmentation: Secondary | ICD-10-CM | POA: Diagnosis not present

## 2023-01-02 DIAGNOSIS — L578 Other skin changes due to chronic exposure to nonionizing radiation: Secondary | ICD-10-CM

## 2023-01-02 DIAGNOSIS — W908XXA Exposure to other nonionizing radiation, initial encounter: Secondary | ICD-10-CM

## 2023-01-02 DIAGNOSIS — Z85828 Personal history of other malignant neoplasm of skin: Secondary | ICD-10-CM

## 2023-01-02 DIAGNOSIS — D229 Melanocytic nevi, unspecified: Secondary | ICD-10-CM

## 2023-01-02 DIAGNOSIS — L820 Inflamed seborrheic keratosis: Secondary | ICD-10-CM

## 2023-01-02 DIAGNOSIS — L821 Other seborrheic keratosis: Secondary | ICD-10-CM

## 2023-01-02 DIAGNOSIS — L853 Xerosis cutis: Secondary | ICD-10-CM

## 2023-01-02 DIAGNOSIS — D1801 Hemangioma of skin and subcutaneous tissue: Secondary | ICD-10-CM

## 2023-01-02 NOTE — Progress Notes (Signed)
   Follow-Up Visit   Subjective  Sabrina Wilson is a 82 y.o. female who presents for the following: Skin Cancer Screening and Full Body Skin Exam. Hx of SCC.  Recheck AK's at left lateral ankle and right pretibial. Tx with LN2 3 months ago.   The patient presents for Total-Body Skin Exam (TBSE) for skin cancer screening and mole check. The patient has spots, moles and lesions to be evaluated, some may be new or changing and the patient may have concern these could be cancer.    The following portions of the chart were reviewed this encounter and updated as appropriate: medications, allergies, medical history  Review of Systems:  No other skin or systemic complaints except as noted in HPI or Assessment and Plan.  Objective  Well appearing patient in no apparent distress; mood and affect are within normal limits.  A full examination was performed including scalp, head, eyes, ears, nose, lips, neck, chest, axillae, abdomen, back, buttocks, bilateral upper extremities, bilateral lower extremities, hands, feet, fingers, toes, fingernails, and toenails. All findings within normal limits unless otherwise noted below.   Relevant physical exam findings are noted in the Assessment and Plan.  Right Lower Leg - Anterior x1 Erythematous keratotic or waxy stuck-on papule or plaque.    Assessment & Plan   HISTORY OF SQUAMOUS CELL CARCINOMA OF THE SKIN. Left lateral ankle. [facility] 08/11/2020  - No evidence of recurrence today - No lymphadenopathy - Recommend regular full body skin exams - Recommend daily broad spectrum sunscreen SPF 30+ to sun-exposed areas, reapply every 2 hours as needed.  - Call if any new or changing lesions are noted between office visits     SKIN CANCER SCREENING PERFORMED TODAY.  ACTINIC DAMAGE - Chronic condition, secondary to cumulative UV/sun exposure - diffuse scaly erythematous macules with underlying dyspigmentation - Recommend daily broad spectrum sunscreen SPF  30+ to sun-exposed areas, reapply every 2 hours as needed.  - Staying in the shade or wearing long sleeves, sun glasses (UVA+UVB protection) and wide brim hats (4-inch brim around the entire circumference of the hat) are also recommended for sun protection.  - Call for new or changing lesions.  LENTIGINES, SEBORRHEIC KERATOSES, HEMANGIOMAS - Benign normal skin lesions - Benign-appearing - Call for any changes  MELANOCYTIC NEVI - Tan-brown and/or pink-flesh-colored symmetric macules and papules - Benign appearing on exam today - Observation - Call clinic for new or changing moles - Recommend daily use of broad spectrum spf 30+ sunscreen to sun-exposed areas.       Inflamed seborrheic keratosis Right Lower Leg - Anterior x1  Symptomatic, irritating, patient would like treated.  Destruction of lesion - Right Lower Leg - Anterior x1  Destruction method: cryotherapy   Informed consent: discussed and consent obtained   Lesion destroyed using liquid nitrogen: Yes   Region frozen until ice ball extended beyond lesion: Yes   Outcome: patient tolerated procedure well with no complications   Post-procedure details: wound care instructions given   Additional details:  Prior to procedure, discussed risks of blister formation, small wound, skin dyspigmentation, or rare scar following cryotherapy. Recommend Vaseline ointment to treated areas while healing.    Return in about 1 year (around 01/02/2024) for TBSE. HxSCC.  I, Lawson Radar, CMA, am acting as scribe for Elie Goody, MD.   Documentation: I have reviewed the above documentation for accuracy and completeness, and I agree with the above.  Elie Goody, MD

## 2023-01-02 NOTE — Patient Instructions (Addendum)
Cryotherapy Aftercare  Wash gently with soap and water everyday.   Apply Vaseline Jelly and Band-Aid daily until healed.    Recommend daily broad spectrum sunscreen SPF 30+ to sun-exposed areas, reapply every 2 hours as needed. Call for new or changing lesions.  Staying in the shade or wearing long sleeves, sun glasses (UVA+UVB protection) and wide brim hats (4-inch brim around the entire circumference of the hat) are also recommended for sun protection.    Melanoma ABCDEs  Melanoma is the most dangerous type of skin cancer, and is the leading cause of death from skin disease.  You are more likely to develop melanoma if you: Have light-colored skin, light-colored eyes, or red or blond hair Spend a lot of time in the sun Tan regularly, either outdoors or in a tanning bed Have had blistering sunburns, especially during childhood Have a close family member who has had a melanoma Have atypical moles or large birthmarks  Early detection of melanoma is key since treatment is typically straightforward and cure rates are extremely high if we catch it early.   The first sign of melanoma is often a change in a mole or a new dark spot.  The ABCDE system is a way of remembering the signs of melanoma.  A for asymmetry:  The two halves do not match. B for border:  The edges of the growth are irregular. C for color:  A mixture of colors are present instead of an even brown color. D for diameter:  Melanomas are usually (but not always) greater than 6mm - the size of a pencil eraser. E for evolution:  The spot keeps changing in size, shape, and color.  Please check your skin once per month between visits. You can use a small mirror in front and a large mirror behind you to keep an eye on the back side or your body.   If you see any new or changing lesions before your next follow-up, please call to schedule a visit.  Please continue daily skin protection including broad spectrum sunscreen SPF 30+ to  sun-exposed areas, reapplying every 2 hours as needed when you're outdoors.   Staying in the shade or wearing long sleeves, sun glasses (UVA+UVB protection) and wide brim hats (4-inch brim around the entire circumference of the hat) are also recommended for sun protection.    Due to recent changes in healthcare laws, you may see results of your pathology and/or laboratory studies on MyChart before the doctors have had a chance to review them. We understand that in some cases there may be results that are confusing or concerning to you. Please understand that not all results are received at the same time and often the doctors may need to interpret multiple results in order to provide you with the best plan of care or course of treatment. Therefore, we ask that you please give [place] 2 business days to thoroughly review all your results before contacting the office for clarification. Should we see a critical lab result, you will be contacted sooner.   If You Need Anything After Your Visit  If you have any questions or concerns for your doctor, please call our main line at [phone] and press option 4 to reach your doctor's medical assistant. If no one answers, please leave a voicemail as directed and we will return your call as soon as possible. Messages left after 4 pm will be answered the following business day.   You may also send [place] a message  via MyChart. We typically respond to MyChart messages within 1-2 business days.  For prescription refills, please ask your pharmacy to contact our office. Our fax number is [phone].  If you have an urgent issue when the clinic is closed that cannot wait until the next business day, you can page your doctor at the number below.    Please note that while we do our best to be available for urgent issues outside of office hours, we are not available 24/7.   If you have an urgent issue and are unable to reach [place], you may choose to seek medical care at your  doctor's office, retail clinic, urgent care center, or emergency room.  If you have a medical emergency, please immediately call 911 or go to the emergency department.  Pager Numbers  - Dr. Gwen Pounds: [phone]  - Dr. Roseanne Reno: [phone]  - Dr. Katrinka Blazing: [phone]   In the event of inclement weather, please call our main line at [phone] for an update on the status of any delays or closures.  Dermatology Medication Tips: Please keep the boxes that topical medications come in in order to help keep track of the instructions about where and how to use these. Pharmacies typically print the medication instructions only on the boxes and not directly on the medication tubes.   If your medication is too expensive, please contact our office at [phone] option 4 or send [place] a message through MyChart.   We are unable to tell what your co-pay for medications will be in advance as this is different depending on your insurance coverage. However, we may be able to find a substitute medication at lower cost or fill out paperwork to get insurance to cover a needed medication.   If a prior authorization is required to get your medication covered by your insurance company, please allow [place] 1-2 business days to complete this process.  Drug prices often vary depending on where the prescription is filled and some pharmacies may offer cheaper prices.  The website www.goodrx.com contains coupons for medications through different pharmacies. The prices here do not account for what the cost may be with help from insurance (it may be cheaper with your insurance), but the website can give you the price if you did not use any insurance.  - You can print the associated coupon and take it with your prescription to the pharmacy.  - You may also stop by our office during regular business hours and pick up a GoodRx coupon card.  - If you need your prescription sent electronically to a different pharmacy, notify  our office through [place] or by phone at [phone] option 4.     Si Usted Necesita Algo Despus de Su Visita  Tambin puede enviarnos un mensaje a travs de Clinical cytogeneticist. Por lo general respondemos a los mensajes de MyChart en el transcurso de 1 a 2 das hbiles.  Para renovar recetas, por favor pida a su farmacia que se ponga en contacto con nuestra oficina. Annie Sable de fax es [place] [phone].  Si tiene un asunto urgente cuando la clnica est cerrada y que no puede esperar hasta el siguiente da hbil, puede llamar/localizar a su doctor(a) al nmero que aparece a continuacin.   Por favor, tenga en cuenta que aunque hacemos todo lo posible para estar disponibles para asuntos urgentes fuera del horario de [place], no estamos disponibles las 24 horas del da, los 7 [address]  [address] de la [place].   Si tiene un problema urgente  y no puede comunicarse con nosotros, puede optar por buscar atencin mdica  en el consultorio de su doctor(a), en una clnica privada, en un centro de atencin urgente o en una sala de emergencias.  Si tiene Engineer, drilling, por favor llame inmediatamente al 911 o vaya a la sala de emergencias.  Nmeros de bper  - Dr. Gwen Pounds: [phone]  - Dra. Roseanne Reno: [phone]  - Dr. Katrinka Blazing: [phone]   En caso de inclemencias del tiempo, por favor llame a Lacy Duverney principal al [phone] para una actualizacin sobre el [place] de cualquier retraso o cierre.  Consejos para la medicacin en dermatologa: Por favor, guarde las cajas en las que vienen los medicamentos de uso tpico para ayudarle a seguir las instrucciones sobre dnde y cmo usarlos. Las farmacias generalmente imprimen las instrucciones del medicamento slo en las cajas y no directamente en los tubos del [place].   Si su medicamento es muy caro, por favor, pngase en contacto con Rolm [place]ala llamando al [phone] y presione la opcin 4 o envenos un mensaje a travs de  Clinical cytogeneticist.   No podemos decirle cul ser su copago por los medicamentos por adelantado ya que esto es diferente dependiendo de la cobertura de su seguro. Sin embargo, es posible que podamos encontrar un medicamento sustituto a Audiological scientist un formulario para que el seguro cubra el medicamento que se considera necesario.   Si se requiere una autorizacin previa para que su compaa de seguros [place] su medicamento, por favor permtanos de 1 a 2 das hbiles para completar [address]  Los precios de los medicamentos varan con frecuencia dependiendo del Environmental consultant de dnde se surte la receta y alguna farmacias pueden ofrecer precios ms baratos.  El sitio web www.goodrx.com tiene cupones para medicamentos de Health and safety inspector. Los precios aqu no tienen en cuenta lo que podra costar con la ayuda del seguro (puede ser ms barato con su seguro), pero el sitio web puede darle el precio si no utiliz Tourist information centre manager.  - Puede imprimir el cupn correspondiente y llevarlo con su receta a la farmacia.  - Tambin puede pasar por nuestra oficina durante el horario de atencin regular y Education officer, museum una tarjeta de cupones de GoodRx.  - Si necesita que su receta se enve electrnicamente a una farmacia diferente, informe a nuestra oficina a travs de MyChart de [place] o por telfono llamando al [phone] y presione la opcin 4.

## 2023-01-04 ENCOUNTER — Other Ambulatory Visit: Payer: Self-pay

## 2023-01-08 ENCOUNTER — Encounter: Payer: Self-pay | Admitting: Family Medicine

## 2023-01-09 ENCOUNTER — Other Ambulatory Visit: Payer: Self-pay | Admitting: Internal Medicine

## 2023-01-09 ENCOUNTER — Other Ambulatory Visit (HOSPITAL_COMMUNITY): Payer: Self-pay

## 2023-01-09 DIAGNOSIS — C50811 Malignant neoplasm of overlapping sites of right female breast: Secondary | ICD-10-CM

## 2023-01-09 NOTE — Telephone Encounter (Signed)
CBC with Differential (Cancer Center Only) Order: [id number] Status: Final result     Visible to patient: Yes (seen)     Next appt: 01/23/2023 at 11:00 AM in Pulmonology [facility] Aundria Rud, MD)     Dx: Carcinoma of overlapping sites of rig...   0 Result Notes          Component Ref Range & Units 1 mo ago (11/16/22) 2 mo ago (10/14/22) 5 mo ago (08/10/22) 5 mo ago (07/14/22) 6 mo ago (06/30/22) 6 mo ago (06/16/22) 7 mo ago (06/08/22)  WBC Count 4.0 - 10.5 K/uL 3.3 Low  3.9 Low  5.8 5.8 5.3 5.9 7.3  RBC 3.87 - 5.11 MIL/uL 2.97 Low  3.32 Low  3.69 Low  3.35 Low  3.19 Low  3.08 Low  3.19 Low   Hemoglobin 12.0 - 15.0 g/dL 40.9 Low  81.1 Low  91.4 11.3 Low  10.6 Low  10.2 Low  10.3 Low   HCT 36.0 - 46.0 % 30.7 Low  32.7 Low  37.5 34.7 Low  33.7 Low  32.5 Low  32.1 Low   MCV 80.0 - 100.0 fL 103.4 High  98.5 101.6 High  103.6 High  105.6 High  105.5 High  100.6 High   MCH 26.0 - 34.0 pg 34.0 33.1 32.8 33.7 33.2 33.1 32.3  MCHC 30.0 - 36.0 g/dL 78.2 95.6 21.3 08.6 57.8 31.4 32.1  RDW 11.5 - 15.5 % 18.4 High  13.2 13.7 14.8 15.4 15.7 High  15.6 High   Platelet Count 150 - 400 K/uL 172 135 Low  200 200 230 227 303  nRBC 0.0 - 0.2 % 0.0 0.0 0.0 0.0 CM 0.0 CM 0.0 CM 0.0  Neutrophils Relative % % 55 65 64    67  Neutro Abs 1.7 - 7.7 K/uL 1.8 2.5 3.7    4.9  Lymphocytes Relative % 27 23 19    16   Lymphs Abs 0.7 - 4.0 K/uL 0.9 0.9 1.1    1.2  Monocytes Relative % 12 5 10    11   Monocytes Absolute 0.1 - 1.0 K/uL 0.4 0.2 0.6    0.8  Eosinophils Relative % 4 6 6    6   Eosinophils Absolute 0.0 - 0.5 K/uL 0.1 0.2 0.4    0.4  Basophils Relative % 2 1 1     0  Basophils Absolute 0.0 - 0.1 K/uL 0.1 0.0 0.1    0.0  Immature Granulocytes % 0 0 0    0  Abs Immature Granulocytes 0.00 - 0.07 K/uL 0.01 0.01 CM 0.01 CM    0.02 CM  Comment: Performed at [facility], [address] [place], [place] [address]  Resulting Agency [facility] CLIN LAB CH CLIN LAB CH CLIN LAB CH CLIN LAB CH CLIN LAB CH  CLIN LAB CH CLIN LAB         Specimen Collected: 11/16/22 14:33 Last Resulted: 11/16/22 14:49      Lab Flowsheet      Order Details      View Encounter      Lab and Collection Details      Routing      Result History    View All Conversations on this Encounter      CM=Additional comments      Result Care Coordination   Patient Communication   Add Comments   Seen Back to Top    Other Results from 11/16/2022   Contains abnormal data CMP (Cancer Center only) Order: [id number] Status:  Final result      Visible to patient: Yes (seen)      Next appt: 01/23/2023 at 11:00 AM in Pulmonology [facility] Aundria Rud, MD)      Dx: Carcinoma of overlapping sites of rig...    0 Result Notes            Component Ref Range & Units 1 mo ago (11/16/22) 2 mo ago (10/14/22) 5 mo ago (08/10/22) 7 mo ago (06/08/22) 7 mo ago (06/03/22) 7 mo ago (05/31/22) 7 mo ago (05/30/22)  Sodium 135 - 145 mmol/L 136 137 136 135 135 R 129 Low  131 Low   Potassium 3.5 - 5.1 mmol/L 3.9 3.9 3.6 4.4 4.2 R 4.0 3.4 Low   Chloride 98 - 111 mmol/L 103 103 102 100 98 R 96 Low  96 Low   CO2 22 - 32 mmol/L 26 25 26 27 26  R 24 27  Glucose, Bld 70 - 99 mg/dL 161 High  096 High  CM 136 High  CM 113 High  CM 150 High  225 High  CM 121 High  CM  Comment: Glucose reference range applies only to samples taken after fasting for at least 8 hours.  BUN 8 - 23 mg/dL 25 High  25 High  19 15 26  High  R 16 11  Creatinine 0.44 - 1.00 mg/dL 0.45 High  4.09 High  8.11 0.76 0.85 R 0.75 0.66  Calcium 8.9 - 10.3 mg/dL 9.6 91.4 9.8 9.7 9.9 R 9.3 9.0  Total Protein 6.5 - 8.1 g/dL 6.5 6.8 6.7 6.2 Low      Albumin 3.5 - 5.0 g/dL 4.1 4.1 4.1 3.3 Low      AST 15 - 41 U/L 19 19 24 18      ALT 0 - 44 U/L 13 13 15 26      Alkaline Phosphatase 38 - 126 U/L 51 64 64 57     Total Bilirubin 0.3 - 1.2 mg/dL 0.6 1.0 0.5 0.6     GFR, Estimated >60 mL/min 49 Low  43 Low  CM >60 CM      Comment: (NOTE) Calculated using the CKD-EPI  Creatinine Equation (2021)  Anion gap 5 - 15 7 9  CM 8 CM 8 CM  9 CM 8 CM  Comment: Performed at [facility], [address] [place], [place] [address]  Resulting Agency [facility] CLIN LAB CH CLIN LAB CH CLIN LAB CH CLIN LAB [place] [facility] CLIN LAB CH CLIN LAB         Specimen Collected: 11/16/22 14:33 Last Resulted: 11/16/22 14:55

## 2023-01-10 ENCOUNTER — Other Ambulatory Visit (HOSPITAL_COMMUNITY): Payer: Self-pay

## 2023-01-11 ENCOUNTER — Other Ambulatory Visit: Payer: Self-pay

## 2023-01-13 ENCOUNTER — Inpatient Hospital Stay (HOSPITAL_BASED_OUTPATIENT_CLINIC_OR_DEPARTMENT_OTHER): Payer: Medicare Other | Admitting: Internal Medicine

## 2023-01-13 ENCOUNTER — Ambulatory Visit
Admission: RE | Admit: 2023-01-13 | Discharge: 2023-01-13 | Disposition: A | Discharge status: Home / Self Care | Payer: Medicare Other | Attending: Internal Medicine | Admitting: Internal Medicine

## 2023-01-13 ENCOUNTER — Inpatient Hospital Stay: Payer: Medicare Other | Attending: Internal Medicine

## 2023-01-13 ENCOUNTER — Ambulatory Visit
Admission: RE | Admit: 2023-01-13 | Discharge: 2023-01-13 | Disposition: A | Discharge status: Home / Self Care | Payer: Medicare Other | Source: Ambulatory Visit | Attending: Internal Medicine | Admitting: Internal Medicine

## 2023-01-13 VITALS — BP 142/80 | HR 82 | Temp 99.3°F | Wt 176.0 lb

## 2023-01-13 DIAGNOSIS — R0600 Dyspnea, unspecified: Secondary | ICD-10-CM | POA: Insufficient documentation

## 2023-01-13 DIAGNOSIS — Z79899 Other long term (current) drug therapy: Secondary | ICD-10-CM | POA: Insufficient documentation

## 2023-01-13 DIAGNOSIS — Z808 Family history of malignant neoplasm of other organs or systems: Secondary | ICD-10-CM | POA: Insufficient documentation

## 2023-01-13 DIAGNOSIS — Z87891 Personal history of nicotine dependence: Secondary | ICD-10-CM | POA: Insufficient documentation

## 2023-01-13 DIAGNOSIS — R0602 Shortness of breath: Secondary | ICD-10-CM | POA: Diagnosis not present

## 2023-01-13 DIAGNOSIS — C50811 Malignant neoplasm of overlapping sites of right female breast: Secondary | ICD-10-CM | POA: Diagnosis not present

## 2023-01-13 DIAGNOSIS — Z79811 Long term (current) use of aromatase inhibitors: Secondary | ICD-10-CM | POA: Insufficient documentation

## 2023-01-13 DIAGNOSIS — Z170 Estrogen receptor positive status [ER+]: Secondary | ICD-10-CM | POA: Insufficient documentation

## 2023-01-13 DIAGNOSIS — M8580 Other specified disorders of bone density and structure, unspecified site: Secondary | ICD-10-CM | POA: Insufficient documentation

## 2023-01-13 DIAGNOSIS — M7989 Other specified soft tissue disorders: Secondary | ICD-10-CM | POA: Insufficient documentation

## 2023-01-13 DIAGNOSIS — Z800 Family history of malignant neoplasm of digestive organs: Secondary | ICD-10-CM | POA: Insufficient documentation

## 2023-01-13 DIAGNOSIS — R059 Cough, unspecified: Secondary | ICD-10-CM | POA: Diagnosis not present

## 2023-01-13 LAB — CMP (CANCER CENTER ONLY)
ALT: 15 U/L (ref 0–44)
AST: 20 U/L (ref 15–41)
Albumin: 4.3 g/dL (ref 3.5–5.0)
Alkaline Phosphatase: 44 U/L (ref 38–126)
Anion gap: 9 (ref 5–15)
BUN: 20 mg/dL (ref 8–23)
CO2: 25 mmol/L (ref 22–32)
Calcium: 10 mg/dL (ref 8.9–10.3)
Chloride: 106 mmol/L (ref 98–111)
Creatinine: 0.98 mg/dL (ref 0.44–1.00)
GFR, Estimated: 59 mL/min — ABNORMAL LOW (ref >60)
Glucose, Bld: 100 mg/dL — ABNORMAL HIGH (ref 70–99)
Potassium: 3.7 mmol/L (ref 3.5–5.1)
Sodium: 140 mmol/L (ref 135–145)
Total Bilirubin: 0.9 mg/dL (ref 0.3–1.2)
Total Protein: 6.8 g/dL (ref 6.5–8.1)

## 2023-01-13 LAB — CBC WITH DIFFERENTIAL (CANCER CENTER ONLY)
Abs Immature Granulocytes: 0.03 10*3/uL (ref 0.00–0.07)
Basophils Absolute: 0.1 10*3/uL (ref 0.0–0.1)
Basophils Relative: 1 %
Eosinophils Absolute: 0.2 10*3/uL (ref 0.0–0.5)
Eosinophils Relative: 3 %
HCT: 30.5 % — ABNORMAL LOW (ref 36.0–46.0)
Hemoglobin: 10.3 g/dL — ABNORMAL LOW (ref 12.0–15.0)
Immature Granulocytes: 1 %
Lymphocytes Relative: 20 %
Lymphs Abs: 1 10*3/uL (ref 0.7–4.0)
MCH: 37.9 pg — ABNORMAL HIGH (ref 26.0–34.0)
MCHC: 33.8 g/dL (ref 30.0–36.0)
MCV: 112.1 fL — ABNORMAL HIGH (ref 80.0–100.0)
Monocytes Absolute: 0.5 10*3/uL (ref 0.1–1.0)
Monocytes Relative: 9 %
Neutro Abs: 3.3 10*3/uL (ref 1.7–7.7)
Neutrophils Relative %: 66 %
Platelet Count: 128 10*3/uL — ABNORMAL LOW (ref 150–400)
RBC: 2.72 MIL/uL — ABNORMAL LOW (ref 3.87–5.11)
RDW: 15.2 % (ref 11.5–15.5)
WBC Count: 5.1 10*3/uL (ref 4.0–10.5)
nRBC: 0 % (ref 0.0–0.2)

## 2023-01-13 LAB — BRAIN NATRIURETIC PEPTIDE: B Natriuretic Peptide: 316.5 pg/mL — ABNORMAL HIGH (ref 0.0–100.0)

## 2023-01-13 LAB — VITAMIN D 25 HYDROXY (VIT D DEFICIENCY, FRACTURES): Vit D, 25-Hydroxy: 53.51 ng/mL (ref 30–100)

## 2023-01-13 NOTE — Patient Instructions (Signed)
#   STOP VERZENIO unitl further directions.

## 2023-01-13 NOTE — Progress Notes (Unsigned)
Patient 95% while ambulating lowest 93%

## 2023-01-13 NOTE — Progress Notes (Unsigned)
Patient is having shortness of breath and off balance, plus patient has some other questions for doctor today. She does have stomach pain a lot.

## 2023-01-13 NOTE — Progress Notes (Unsigned)
[facility] CONSULT NOTE  Patient Care Team: [name], MD as PCP - General (Family Medicine) [name], MD as PCP - Cardiology (Cardiology) [name], [name], MD as Consulting Physician (General Surgery) [name], MD (Internal Medicine) [name], RN as Oncology Nurse Navigator [name], MD as Consulting Physician (Radiation Oncology) [name], MD as Consulting Physician (Internal Medicine) Sung Amabile, DO as Consulting Physician (General Surgery)  CHIEF COMPLAINTS/PURPOSE OF CONSULTATION: Breast cancer  #  Oncology History Overview Note  # 2009- Sigmoid colon cancer stage II (T3 N0 4 lymph nodes sampled M0 stage II high risk there was obstruction and perforation abscess; Dr.Hern), workup included a low CEA preoperatively and a chest x-ray and CT scan abdomen pelvis negative for metastatic disease, S/P colostomy; s/p FOLFOX x 6 months. [Dr.Gittin ]  # LATER REVERSED , K RAS  wild type B RAF not evaluated   Also initial iron deficiency with anemia thrombocytosis, high platelets considered reactive, workup includes a normal PCR for CML and a negative [id number] mutation  DIAGNOSIS:  A. BREAST, RIGHT; LUMPECTOMY:  - INVASIVE MAMMARY CARCINOMA.  - DUCTAL CARCINOMA IN SITU (DCIS).  - SEE CANCER SUMMARY BELOW.  - BIOPSY SITE CHANGE WITH CLIP (2).  - FIBROUS TISSUE WITH CYST FORMATION AND FLORID DUCTAL HYPERPLASIA.  - SKIN AND NIPPLE NEGATIVE FOR MALIGNANCY.  - VASCULAR CALCIFICATION.   B. SENTINEL LYMPH NODES 1 AND 2, RIGHT AXILLA; EXCISION:  - METASTATIC CARCINOMA INVOLVES ONE OF TWO LYMPH NODES (1/2).   C. NON-SENTINEL LYMPH NODES, RIGHT AXILLA; EXCISION:  - ONE LYMPH NODE NEGATIVE FOR MALIGNANCY (0/1).   CANCER CASE SUMMARY: INVASIVE CARCINOMA OF THE BREAST  Standard(s): AJCC-UICC 8   SPECIMEN  Procedure: Lumpectomy  Specimen Laterality: Right   TUMOR  Histologic Type: Invasive carcinoma of no special type  (ductal)  Histologic Grade (Nottingham Histologic Score)       Glandular (Acinar)/Tubular Differentiation: 3       Nuclear Pleomorphism: 3       Mitotic Rate: 3       Overall Grade: 3  Tumor Size: 24 mm  Tumor Focality: Single focus of invasive carcinoma  Ductal Carcinoma In Situ (DCIS): Present, nuclear grade 2-3  Tumor Extent: Not applicable  Lymphatic and/or Vascular Invasion: Present  Treatment Effect in the Breast: No known presurgical therapy   MARGINS  Margin Status for Invasive Carcinoma: All margins negative for invasive  carcinoma       Distance from closest margin: 10 mm       Specify closest margin: Superior   Margin Status for DCIS: All margins negative for DCIS       Distance from DCIS to closest margin: 10 mm       Specify closest margin: Superior   REGIONAL LYMPH NODES  Regional Lymph Node Status: Tumor present in regional lymph node(s)       Number of Lymph Nodes with Macrometastases (greater than 2 mm): 1       Number of Lymph Nodes with Micrometastases (greater than 0.2 mm to  2 mm and/or greater than 200 cells): 0       Number of Lymph Nodes with Isolated Tumor Cells (0.2 mm or less OR  200 cells or less): 0       Size of Largest Metastatic Deposit: 3 mm      Extranodal Extension: Not identified       Total Number of Lymph Nodes Examined (sentinel and non-sentinel): 3  Number of Sentinel Nodes Examined: 2   DISTANT METASTASIS  Distant Site(s) Involved, if applicable: Not applicable   PATHOLOGIC STAGE CLASSIFICATION (pTNM, AJCC 8th Edition):  Modified Classification: Not applicable  pT Category: pT2  T Suffix: Not applicable  pN Category: pN1a  N Suffix: (sn)  pM Category: Not applicable   SPECIAL STUDIES  Breast Biomarker Testing Performed on Previous Outside Biopsy:  23-250-T11  Per outside report:  Estrogen Receptor (ER) Status: POSITIVE          Percentage of cells with nuclear positivity: Greater than 90%          Average intensity of  staining: Strong   Progesterone Receptor (PgR) Status: POSITIVE          Percentage of cells with nuclear positivity: 60%          Average intensity of staining: Strong   HER2 (by immunohistochemistry): EQUIVOCAL (Score 2+)  HER2 FISH: NEGATIVE   Ki-67: Not performed   # Cytoxan Taxotere x 4 cycles; finished JAN, 2024. [Neutropenic fevers s/p cycle #4;  -FEB 2nd, 2024-right lung PE; left lower LE  DVT- on eliquis.   # FEB 19th-start RT  IMPRESSION: 1. Complex cystic and solid-appearing mass within the subareolar RIGHT breast, overall measuring 3.1 cm greatest dimension, the solid enhancing component at the anterior-lateral aspect of the mass measuring 2 x 1 cm, abutting the overlying skin but without evidence of involvement/enhancement of the skin. Susceptibility artifact within the mass may represent a biopsy clip (if biopsy has been performed at an outside site since the diagnostic mammogram/ultrasound of 12/28/2021) or this artifact could represent calcifications within the mass. This mass corresponds to the recent ultrasound finding of a complex irregular mass at the 11 o'clock axis of the retroareolar RIGHT breast. 2. No evidence of multifocal or multicentric disease within the RIGHT breast. 3. No evidence of contralateral disease in the LEFT breast.   RECOMMENDATION: If has not already been performed, recommend ultrasound-guided biopsy of the suspicious mass in the subareolar RIGHT breast, with preferential targeting of the anterior-lateral component of the mass which is shown to be the enhancing component on today's MRI.   BI-RADS CATEGORY  4: Suspicious.     Cancer of sigmoid (HCC) (Resolved)  Carcinoma of overlapping sites of right breast in female, estrogen receptor positive (HCC)  02/14/2022 Initial Diagnosis   Carcinoma of overlapping sites of right breast in female, estrogen receptor positive (HCC)   02/14/2022 Cancer Staging   Staging form: Breast, AJCC  8th Edition - Pathologic: Stage IIA (pT2, pN1, cM0, G3, ER+, PR+, HER2-) - Signed by [name], MD on 02/14/2022 Histologic grading system: 3 grade system   02/23/2022 -  Chemotherapy   Patient is on Treatment Plan : BREAST TC q21d      HISTORY OF PRESENTING ILLNESS:   Alone.  Ambulating independently.   Suanne Damboise 82 y.o.  female patient with ER/PR positive Her 2 Negative  breast cancer stage II T2 N1-currently  adjuvant anastrozole; and verzenio [started second week of June 2024] is here for a follow up.  Patient is having shortness of breath and off balance.  Mild productive cough.  No fevers.  She does have abdominal discomfort.  Diarrhea resolved- self limited.  Complains of extreme fatigue.  Her leg swelling is improved overall.  Denies any blood in the black or stools.  Review of Systems  Constitutional:  Positive for malaise/fatigue and weight loss. Negative for chills, diaphoresis and fever.  HENT:  Negative for nosebleeds and sore throat.   Eyes:  Negative for double vision.  Respiratory:  Positive for cough and shortness of breath. Negative for hemoptysis, sputum production and wheezing.   Cardiovascular:  Negative for chest pain, palpitations, orthopnea and leg swelling.  Gastrointestinal:  Positive for abdominal pain and nausea. Negative for blood in stool, constipation, diarrhea, heartburn, melena and vomiting.  Genitourinary:  Negative for dysuria, frequency and urgency.  Musculoskeletal:  Negative for back pain and joint pain.  Skin: Negative.  Negative for itching and rash.  Neurological:  Negative for dizziness, tingling, focal weakness, weakness and headaches.  Endo/Heme/Allergies:  Does not bruise/bleed easily.  Psychiatric/Behavioral:  Negative for depression. The patient is not nervous/anxious and does not have insomnia.      MEDICAL HISTORY:  Past Medical History:  Diagnosis Date   Actinic keratosis 02/12/2020   R lat calf (hypertrophic)    Arthritis    Cancer of sigmoid (HCC) 06/17/2016   Colon cancer (HCC) 2009   T3,N0; s/p resection  Dr. Earnestine Leys and chemotherapy by Dr. Neale Burly   Diastolic dysfunction    a. 08/2020 Echo: EF 60-65%, no rwma, Gr1 DD, nl RV size/fxn. Mild MR.   Hernia of flank    History of actinic keratoses 08/11/2020   Bx proven at left lateral ankle proximal, LN2 10/08/20   History of stress test    a. 09/2020 MV: EF >65%. No ischemia/infarct. Mild Ao Ca2+ and minimal Cor Ca2+.   Hypertension    Neuropathy    Polyp at cervical os    s/p resection Dr. Hyacinth Meeker   Skin cancer 01/22/2020   surface of an atypical verrucous squamous proliferation    Squamous cell carcinoma of skin 07/16/2020   Left lateral ankle anterior, treated with [facility] 08-11-2020    SURGICAL HISTORY: Past Surgical History:  Procedure Laterality Date   BREAST BIOPSY Right 05/2014   Dr. Rutherford Nail office-benign   BREAST LUMPECTOMY WITH SENTINEL LYMPH NODE BIOPSY Right 01/21/2022   Procedure: BREAST LUMPECTOMY WITH SENTINEL LYMPH NODE BX;  Surgeon: [name], MD;  Location: [facility] ORS;  Service: General;  Laterality: Right;   BREAST SURGERY Right February 2016   Vacuum assisted biopsy for recurrent cyst, fibrocystic changes without evidence of malignancy.   CATARACT EXTRACTION W/PHACO Left 01/13/2015   Procedure: CATARACT EXTRACTION PHACO AND INTRAOCULAR LENS PLACEMENT (IOC);  Surgeon: Galen Manila, MD;  Location: [facility] ORS;  Service: Ophthalmology;  Laterality: Left;  [place]  1:02.9 AP   19.2 CDE  12.06 casette lot #  [id number]   CATARACT EXTRACTION W/PHACO Right 02/03/2015   Procedure: CATARACT EXTRACTION PHACO AND INTRAOCULAR LENS PLACEMENT (IOC);  Surgeon: Galen Manila, MD;  Location: [facility] ORS;  Service: Ophthalmology;  Laterality: Right;  [id number]:53 ap46.5 [id number]   COLECTOMY     COLONOSCOPY  2015   Dr Bluford Kaufmann   COLONOSCOPY WITH PROPOFOL N/A 09/13/2017   Procedure: COLONOSCOPY WITH PROPOFOL;  Surgeon: [name], MD;  Location:  [facility] END[facility]COPY;  Service: Endoscopy;  Laterality: N/A;   COLONOSCOPY WITH PROPOFOL N/A 11/08/2017   Procedure: COLONOSCOPY WITH PROPOFOL;  Surgeon: [name], MD;  Location: [facility] ENDOSCOPY;  Service: Endoscopy;  Laterality: N/A;   colostomy reversal     DILATION AND CURETTAGE OF UTERUS  2014   Dr. Hyacinth Meeker, benign per pt   EYE SURGERY     HERNIA REPAIR  07/13/12   ventral    SKIN CANCER EXCISION     TONSILLECTOMY      SOCIAL HISTORY:  Social History   Socioeconomic History   Marital status: Married    Spouse name: Not on file   Number of children: Not on file   Years of education: Not on file   Highest education level: Not on file  Occupational History   Not on file  Tobacco Use   Smoking status: Former    Current packs/day: 0.00    Types: Cigarettes    Quit date: 10/12/2007    Years since quitting: 15.2   Smokeless tobacco: Never  Vaping Use   Vaping status: Never Used  Substance and Sexual Activity   Alcohol use: Yes    Alcohol/week: 1.0 standard drink of alcohol    Types: 1 Standard drinks or equivalent per week    Comment: with dinner GLASS OF WINE EACH DAY   Drug use: No   Sexual activity: Not on file  Other Topics Concern   Not on file  Social History Narrative   Lives in [place] with husband. 2 children, son lives nearby.Diet - regularExercise - none. 30 mins/in [place]. Quit smoking in 2009. Wine before dinner. Pianist in church; real estate; Diplomatic Services operational officer.    Social Determinants of Health   Financial Resource Strain: Low Risk  (11/21/2022)   Overall Financial Resource Strain (CARDIA)    Difficulty of Paying Living Expenses: Not hard at all  Food Insecurity: No Food Insecurity (11/21/2022)   Hunger Vital Sign    Worried About Running Out of Food in the Last Year: Never true    Ran Out of Food in the Last Year: Never true  Transportation Needs: No Transportation Needs (11/21/2022)   PRAPARE - Administrator, Civil Service (Medical): No     Lack of Transportation (Non-Medical): No  Physical Activity: Inactive (11/21/2022)   Exercise Vital Sign    Days of Exercise per Week: 0 days    Minutes of Exercise per Session: 0 min  Stress: No Stress Concern Present (11/21/2022)   [employer] of Occupational Health - Occupational Stress Questionnaire    Feeling of Stress : Not at all  Social Connections: Socially Integrated (11/21/2022)   Social Connection and Isolation Panel [NHANES]    Frequency of Communication with Friends and Family: More than three times a week    Frequency of Social Gatherings with Friends and Family: More than three times a week    Attends Religious Services: More than 4 times per year    Active Member of [employer] or Organizations: Yes    Attends Banker Meetings: More than 4 times per year    Marital Status: Married  Catering manager Violence: Not At Risk (11/21/2022)   Humiliation, Afraid, Rape, and Kick questionnaire    Fear of Current or Ex-Partner: No    Emotionally Abused: No    Physically Abused: No    Sexually Abused: No    FAMILY HISTORY: Family History  Problem Relation Age of Onset   Stroke Mother    Diabetes Mother    Colon cancer Father        dx 44s   Stroke Sister    Colon cancer Brother        dx 1s-70s   Brain cancer Brother        dx 60s-70s    ALLERGIES:  is allergic to sulfa antibiotics.  MEDICATIONS:  Current Outpatient Medications  Medication Sig Dispense Refill   abemaciclib (VERZENIO) 100 MG tablet Take 1 tablet (100 mg total) by mouth 2 (two) times daily.  56 tablet 0   albuterol (VENTOLIN HFA) 108 (90 Base) MCG/ACT inhaler Inhale 2 puffs into the lungs every 6 (six) hours as needed for wheezing or shortness of breath. 8 g 2   anastrozole (ARIMIDEX) 1 MG tablet TAKE 1 TABLET BY MOUTH DAILY 90 tablet 0   atorvastatin (LIPITOR) 10 MG tablet TAKE 1 TABLET BY MOUTH DAILY 90 tablet 1   Calcium Carbonate-Vit D-Min (CALCIUM 1200 PO) Take 1 tablet by mouth daily.      guaiFENesin (MUCINEX) 600 MG 12 hr tablet Take 1 tablet (600 mg total) by mouth 2 (two) times daily as needed for cough or to loosen phlegm. 10 tablet 0   halobetasol (ULTRAVATE) 0.05 % cream Apply topically to aa's of rash at body twice daily on weekends only as needed. Avoid applying to face, groin, and axilla. Use as directed. 50 g 1   Multiple Vitamins-Minerals (PRESERVISION AREDS 2 PO)      niacinamide 500 MG tablet Take 500 mg by mouth 2 (two) times daily.     Omega-3 Fatty Acids (FISH OIL PO) Take 1 capsule by mouth daily.     tacrolimus (PROTOPIC) 0.1 % ointment Apply topically to affected areas of rash twice daily on week days only prn 60 g 1   umeclidinium bromide (INCRUSE ELLIPTA) 62.5 MCG/ACT AEPB Inhale 1 puff into the lungs daily. 30 each 0   VITAMIN D, CHOLECALCIFEROL, PO Take 2,000 Units by mouth daily.     No current facility-administered medications for this visit.    PHYSICAL EXAMINATION:   Vitals:   01/13/23 1451 01/13/23 1516  BP: (!) 162/75 (!) 142/80  Pulse: 82   Temp: 99.3 F (37.4 C)   SpO2: 100%     Filed Weights   01/13/23 1451  Weight: 176 lb (79.8 kg)     Physical Exam Vitals and nursing note reviewed.  HENT:     Head: Normocephalic and atraumatic.     Mouth/Throat:     Pharynx: Oropharynx is clear.  Eyes:     Extraocular Movements: Extraocular movements intact.     Pupils: Pupils are equal, round, and reactive to light.  Cardiovascular:     Rate and Rhythm: Normal rate and regular rhythm.  Pulmonary:     Comments: Decreased breath sounds bilaterally.  Abdominal:     Palpations: Abdomen is soft.  Musculoskeletal:        General: Normal range of motion.     Cervical back: Normal range of motion.  Skin:    General: Skin is warm.  Neurological:     General: No focal deficit present.     Mental Status: She is alert and oriented to person, place, and time.  Psychiatric:        Behavior: Behavior normal.        Judgment: Judgment  normal.      LABORATORY DATA:  I have reviewed the data as listed Lab Results  Component Value Date   WBC 5.1 01/13/2023   HGB 10.3 (L) 01/13/2023   HCT 30.5 (L) 01/13/2023   MCV 112.1 (H) 01/13/2023   PLT 128 (L) 01/13/2023   Recent Labs    10/14/22 1402 11/16/22 1433 01/13/23 1432  NA 137 136 140  K 3.9 3.9 3.7  CL 103 103 106  CO2 25 26 25   GLUCOSE 141* 112* 100*  BUN 25* 25* 20  CREATININE 1.27* 1.14* 0.98  CALCIUM 10.0 9.6 10.0  GFRNONAA 43* 49* 59*  PROT 6.8 6.5 6.8  ALBUMIN 4.1 4.1 4.3  AST 19 19 20   ALT 13 13 15   ALKPHOS 64 51 44  BILITOT 1.0 0.6 0.9    RADIOGRAPHIC STUDIES: I have personally reviewed the radiological images as listed and agreed with the findings in the report. DG Chest 2 View  Result Date: 01/13/2023 CLINICAL DATA:  Cough and shortness of breath. EXAM: CHEST - 2 VIEW COMPARISON:  05/30/2022 FINDINGS: Lungs are adequately inflated with mild flattening of the hemidiaphragms. No focal lobar consolidation or effusion. Cardiomediastinal silhouette and remainder of the exam is unchanged. IMPRESSION: No acute cardiopulmonary disease. Electronically Signed   By: Elberta Fortis M.D.   On: 01/13/2023 16:48    ASSESSMENT & PLAN:   Carcinoma of overlapping sites of right breast in female, estrogen receptor positive (HCC) # Right breast cancer -[facility]; ER/PR positive Her 2 Negative ;  G-3; LVI + T2N1.  Stage II [OCT 2023; Dr.Byrnett] s/p Lumpectomy; node positive-Oncotype RS- 35- s/p  adjuvant chemotherapy with Taxotere-Cytoxan every 3 weeks x4 cycles.  S/p postlumpectomy radiation April 3rd 2024.    # Currently on anastrozole once a day [April 2024]-tolerating well without any major side effects.  # Patient also on  adjuvant abemaciclib 100 mg BID [started second week of june]-tolerating poorly given ongoing fatigue/dyspnea-see below.  Recommend holding abemaciclib at this time.  # Worsening dyspnea- ? Etiology- prodcutive cough/ no fevers; leg  swelling-check chest x-ray.  BNP slightly elevated.  Recommend Lasix 20 mg a day/Kdur.  2D echo 2022- EF- preserved; recommend follow up with Cardiology; Dr.Etaang.  # Diarrhea- Improved with immoidum pnr.   # [2nd,FEB 2024] CTA-  acute pulmonary embolism, with new segmental to subsegmental embolus present in the right lower lobe/right lower lobe-pulmonary infarct. LEFT LE- Acute DVT-on anticoagulation with eliquis on 5 mg BID X 6 months- [provoked sec to chemo]. Stop eliquis in End of  AUG 2024.    # Bone health-2023 osteopenic T-score of -1.1.Continue calcium plus vitamin D.- stable.  I discussed the role of adjuvant Zometa every 6 months x 3 years. Awaiting dental evaluation [[facility] vs others- will send referral if needed].  Continue ca+Vit D BID.   # Genetic testing: brother-& father- colon cancer; other brother- brain tumor; no breast cancers in family. pending genetic blood work today.   # Right arm decreased range of motion/swelling- ?  Lymphedema- s/p evaluation- Maureen/OT- stable.   # IV Access: PIV   # DISPOSITION: # O2 sat after ambulating # ADD BNP # CXR today # follow in 2  weeks-  MD;labs- cbc/cmp; BNP- Dr.B  Addendum: I spoke to patient regarding results of the chest x-ray-elevated BNP recommend Lasix 20 mg once a day; also K-Dur 20 milliequivalents once a day.  Recommend follow-up with cardiology.  Please send MyChart message to patient with Dr.Agbor-Etang's office number.   All questions were answered. The patient/family knows to call the clinic with any problems, questions or concerns.    [name], MD 01/18/2023 1:25 PM

## 2023-01-13 NOTE — Assessment & Plan Note (Signed)
#   Right breast cancer -[facility]; ER/PR positive Her 2 Negative ;  G-3; LVI + T2N1.  Stage II [OCT 2023; Dr.Byrnett] s/p Lumpectomy; node positive-Oncotype RS- 37- s/p  adjuvant chemotherapy with Taxotere-Cytoxan every 3 weeks x4 cycles. Consider abema x 2 years/zometa.   S/p postlumpectomy radiation April 3rd 2024.    # Currently on anastrozole once a day [April 2024]-tolerating well without any major side effects; on  adjuvant abemaciclib 100 mg BID [started second week of june]-tolerating except for fatigue/dyspnea  # Worsening dyspnea- ? Etiology- prodcutive cough/ no fevers; leg swelling-   # Diarrhea- Improved with immoidum pnr.   # [2nd,FEB 2024] CTA-  acute pulmonary embolism, with new segmental to subsegmental embolus present in the right lower lobe/right lower lobe-pulmonary infarct. LEFT LE- Acute DVT-on anticoagulation with eliquis on 5 mg BID X 6 months- [provoked sec to chemo]. Stop eliquis in End of  AUG 2024.    # Bone health-2023 osteopenic T-score of -1.1.Continue calcium plus vitamin D.- stable.  I discussed the role of adjuvant Zometa every 6 months x 3 years. Awaiting dental evaluation [[facility] vs others- will send referral if needed].  Continue ca+Vit D BID.   # Genetic testing: brother-& father- colon cancer; other brother- brain tumor; no breast cancers in family. pending genetic blood work today.   # Right arm decreased range of motion/swelling- ?  Lymphedema- s/p evaluation- Maureen/OT- stable.   # IV Access: PIV   # DISPOSITION: # O2 sat after ambulating # ADD BNP # CXR today # follow in 2  weeks-  MD;labs- cbc/cmp- Dr.B

## 2023-01-16 ENCOUNTER — Other Ambulatory Visit (HOSPITAL_COMMUNITY): Payer: Self-pay

## 2023-01-16 ENCOUNTER — Other Ambulatory Visit: Payer: Self-pay

## 2023-01-18 ENCOUNTER — Encounter: Payer: Self-pay | Admitting: Internal Medicine

## 2023-01-18 MED ORDER — POTASSIUM CHLORIDE CRYS ER 20 MEQ PO TBCR: EXTENDED_RELEASE_TABLET | ORAL | 0 refills | Status: DC

## 2023-01-18 MED ORDER — FUROSEMIDE 20 MG PO TABS: 20.0000 mg | ORAL_TABLET | Freq: Every day | ORAL | 0 refills | Status: DC

## 2023-01-19 ENCOUNTER — Other Ambulatory Visit: Payer: Self-pay

## 2023-01-23 ENCOUNTER — Ambulatory Visit: Payer: Medicare Other | Admitting: Student in an Organized Health Care Education/Training Program

## 2023-01-23 ENCOUNTER — Encounter: Payer: Self-pay | Admitting: Student in an Organized Health Care Education/Training Program

## 2023-01-23 VITALS — BP 124/74 | HR 86 | Temp 98.4°F | Ht 66.0 in | Wt 169.4 lb

## 2023-01-23 DIAGNOSIS — R0602 Shortness of breath: Secondary | ICD-10-CM | POA: Diagnosis not present

## 2023-01-23 MED ORDER — BREZTRI AEROSPHERE 160-9-4.8 MCG/ACT IN AERO: 2.0000 | INHALATION_SPRAY | Freq: Two times a day (BID) | RESPIRATORY_TRACT | Status: DC

## 2023-01-23 NOTE — Progress Notes (Signed)
Synopsis: Referred in for shortness of breath by [name], *  Assessment & Plan:   1. Shortness of breath  Patient is presenting for the evaluation of shortness of breath with an unremarkable physical exam. Workup has included a few CT scans of the chest in the past that show bronchial wall thickening. Given her history of smoking, and current symptoms of cough, chronic bronchitis is on the differential. I will initiate the workup with a pulmonary function test on which we will base further workup and management.  Furthermore, given symptoms of cough and wheeze, I will trial her on a long acting inhaler and assess symptoms on follow up. Should she respond to the treatment, and should pulmonary function testing be consistent with obstructive disease, we will proceed with long term initiation of LABA/LAMA/ICS therapy.  - Pulmonary Function Test [facility] Only; Future -Trial ICS/LABA/LAMA therapy   Return in about 1 month (around 02/22/2023).  I spent 60 minutes caring for this patient today, including preparing to see the patient, obtaining a medical history , reviewing a separately obtained history, performing a medically appropriate examination and/or evaluation, counseling and educating the patient/family/caregiver, ordering medications, tests, or procedures, documenting clinical information in the electronic health record, and independently interpreting results (not separately reported/billed) and communicating results to the patient/family/caregiver  Raechel Chute, MD [facility] 01/23/2023 5:01 PM    End of visit medications:  Meds ordered this encounter  Medications   Budeson-Glycopyrrol-Formoterol (BREZTRI AEROSPHERE) 160-9-4.8 MCG/ACT AERO    Sig: Inhale 2 puffs into the lungs in the morning and at bedtime.    Dispense:  2 each    Order Specific Question:   Lot Number?    Answer:   [id number]    Order Specific Question:   Expiration Date?     Answer:   05/26/2025    Order Specific Question:   Manufacturer?    Answer:   [employer] [[id number]]    Order Specific Question:   Quantity    Answer:   2     Current Outpatient Medications:    abemaciclib (VERZENIO) 100 MG tablet, Take 1 tablet (100 mg total) by mouth 2 (two) times daily., Disp: 56 tablet, Rfl: 0   albuterol (VENTOLIN HFA) 108 (90 Base) MCG/ACT inhaler, Inhale 2 puffs into the lungs every 6 (six) hours as needed for wheezing or shortness of breath., Disp: 8 g, Rfl: 2   anastrozole (ARIMIDEX) 1 MG tablet, TAKE 1 TABLET BY MOUTH DAILY, Disp: 90 tablet, Rfl: 0   atorvastatin (LIPITOR) 10 MG tablet, TAKE 1 TABLET BY MOUTH DAILY, Disp: 90 tablet, Rfl: 1   Budeson-Glycopyrrol-Formoterol (BREZTRI AEROSPHERE) 160-9-4.8 MCG/ACT AERO, Inhale 2 puffs into the lungs in the morning and at bedtime., Disp: 2 each, Rfl:    Calcium Carbonate-Vit D-Min (CALCIUM 1200 PO), Take 1 tablet by mouth daily., Disp: , Rfl:    furosemide (LASIX) 20 MG tablet, Take 1 tablet (20 mg total) by mouth daily. Take in AM, Disp: 30 tablet, Rfl: 0   halobetasol (ULTRAVATE) 0.05 % cream, Apply topically to aa's of rash at body twice daily on weekends only as needed. Avoid applying to face, groin, and axilla. Use as directed., Disp: 50 g, Rfl: 1   Multiple Vitamins-Minerals (PRESERVISION AREDS 2 PO), , Disp: , Rfl:    niacinamide 500 MG tablet, Take 500 mg by mouth 2 (two) times daily., Disp: , Rfl:    Omega-3 Fatty Acids (FISH OIL PO), Take 1 capsule by mouth daily.,  Disp: , Rfl:    potassium chloride SA (KLOR-CON M) 20 MEQ tablet, 1 pill a day., Disp: 30 tablet, Rfl: 0   tacrolimus (PROTOPIC) 0.1 % ointment, Apply topically to affected areas of rash twice daily on week days only prn, Disp: 60 g, Rfl: 1   umeclidinium bromide (INCRUSE ELLIPTA) 62.5 MCG/ACT AEPB, Inhale 1 puff into the lungs daily., Disp: 30 each, Rfl: 0   VITAMIN D, CHOLECALCIFEROL, PO, Take 2,000 Units by mouth daily., Disp: , Rfl:    guaiFENesin  (MUCINEX) 600 MG 12 hr tablet, Take 1 tablet (600 mg total) by mouth 2 (two) times daily as needed for cough or to loosen phlegm. (Patient not taking: Reported on 01/23/2023), Disp: 10 tablet, Rfl: 0   Subjective:   PATIENT ID: Sabrina Wilson GENDER: female DOB: 28-May-1940, MRN: [id number]  Chief Complaint  Patient presents with   pulmonary consult    SOB with exertion and occ at rest, occ wheezing and prod cough with clear sputum.     HPI  Patient is a pleasant 82 year old female presenting to clinic for the evaluation of shortness of breath.  She reports symptoms started a few years ago. She's mostly experiencing exertional dyspnea, with a component of it occurring at rest. She is unable to rush or exert her self secondary to the dyspnea. She reports occasional wheezing and an occasional cough that is productive of clear sputum. There is no report of hemoptysis, chest pain, chest tightness, weight loss, or weight changes. There are no fevers, chills, or night sweats.  Patient follows with Dr. Donneta Romberg from Oncology and was diagnosed with Stage IIA breast cancer for which she's receiving treatment. She did not tolerate the Abemaciclib and is not currently on it. She was also diagnosed with provoked pulmonary embolism in February of 2024 for which she received 6 months of Apixaban (discontinued August of 2024). Patient was also referred to cardiology for evaluation of her symptoms.  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 She has a history of smoking, having quit in 2009.  Ancillary information including prior medications, full medical/surgical/family/social histories, and PFTs (when available) are listed below and have been reviewed.   Review of Systems  Constitutional:  Negative for chills, fever and weight loss.  Respiratory:  Positive for cough and shortness of breath. Negative for hemoptysis, sputum production and wheezing.   Cardiovascular:   Negative for chest pain and palpitations.     Objective:   Vitals:   01/23/23 1112  BP: 124/74  Pulse: 86  Temp: 98.4 F (36.9 C)  TempSrc: Oral  SpO2: 96%  Weight: 169 lb 6.4 oz (76.8 kg)  Height: 5\' 6"  (1.676 m)   96% on RA  BMI Readings from Last 3 Encounters:  01/23/23 27.34 kg/m  01/13/23 28.41 kg/m  11/21/22 29.05 kg/m   Wt Readings from Last 3 Encounters:  01/23/23 169 lb 6.4 oz (76.8 kg)  01/13/23 176 lb (79.8 kg)  11/21/22 180 lb (81.6 kg)    Physical Exam Constitutional:      Appearance: Normal appearance.  Cardiovascular:     Rate and Rhythm: Normal rate and regular rhythm.     Pulses: Normal pulses.     Heart sounds: Normal heart sounds.  Pulmonary:     Effort: Pulmonary effort is normal.     Breath sounds: Normal breath sounds.  Neurological:     General: No focal deficit present.     Mental Status:  She is alert and oriented to person, place, and time. Mental status is at baseline.       Ancillary Information    Past Medical History:  Diagnosis Date   Actinic keratosis 02/12/2020   R lat calf (hypertrophic)   Arthritis    Cancer of sigmoid (HCC) 06/17/2016   Colon cancer (HCC) 2009   T3,N0; s/p resection  Dr. Earnestine Leys and chemotherapy by Dr. Neale Burly   Diastolic dysfunction    a. 08/2020 Echo: EF 60-65%, no rwma, Gr1 DD, nl RV size/fxn. Mild MR.   Hernia of flank    History of actinic keratoses 08/11/2020   Bx proven at left lateral ankle proximal, LN2 10/08/20   History of stress test    a. 09/2020 MV: EF >65%. No ischemia/infarct. Mild Ao Ca2+ and minimal Cor Ca2+.   Hypertension    Neuropathy    Polyp at cervical os    s/p resection Dr. Hyacinth Meeker   Skin cancer 01/22/2020   surface of an atypical verrucous squamous proliferation    Squamous cell carcinoma of skin 07/16/2020   Left lateral ankle anterior, [facility] with [facility] 08-11-2020     Family History  Problem Relation Age of Onset   Stroke Mother    Diabetes Mother    Colon cancer  Father        dx 20s   Stroke Sister    Colon cancer Brother        dx 89s-70s   Brain cancer Brother        dx 60s-70s     Past Surgical History:  Procedure Laterality Date   BREAST BIOPSY Right 05/2014   Dr. Rutherford Nail office-benign   BREAST LUMPECTOMY WITH SENTINEL LYMPH NODE BIOPSY Right 01/21/2022   Procedure: BREAST LUMPECTOMY WITH SENTINEL LYMPH NODE BX;  Surgeon: [name], MD;  Location: [facility] ORS;  Service: General;  Laterality: Right;   BREAST SURGERY Right February 2016   Vacuum assisted biopsy for recurrent cyst, fibrocystic changes without evidence of malignancy.   CATARACT EXTRACTION W/PHACO Left 01/13/2015   Procedure: CATARACT EXTRACTION PHACO AND INTRAOCULAR LENS PLACEMENT (IOC);  Surgeon: Galen Manila, MD;  Location: [facility] ORS;  Service: Ophthalmology;  Laterality: Left;  [place]  1:02.9 AP   19.2 CDE  12.06 casette lot #  [id number]   CATARACT EXTRACTION W/PHACO Right 02/03/2015   Procedure: CATARACT EXTRACTION PHACO AND INTRAOCULAR LENS PLACEMENT (IOC);  Surgeon: Galen Manila, MD;  Location: [facility] ORS;  Service: Ophthalmology;  Laterality: Right;  [id number] ap46.5 [id number]   COLECTOMY     COLONOSCOPY  2015   Dr Bluford Kaufmann   COLONOSCOPY WITH PROPOFOL N/A 09/13/2017   Procedure: COLONOSCOPY WITH PROPOFOL;  Surgeon: [name], MD;  Location[facility];  Service: Endoscopy;  Laterality: N/A;   COLONOSCOPY WITH PROPOFOL N/A 11/08/2017   Procedure: COLONOSCOPY WITH PROPOFOL;  Surgeon: [name], MD;  Location: [facility] ENDOSCOPY;  Service: Endoscopy;  Laterality: N/A;   colostomy reversal     DILATION AND CURETTAGE OF UTERUS  2014   Dr. Hyacinth Meeker, benign per pt   EYE SURGERY     HERNIA REPAIR  07/13/12   ventral    SKIN CANCER EXCISION     TONSILLECTOMY      Social History   Socioeconomic History   Marital status: Married    Spouse name: Not on file   Number of children: Not on file   Years of education: Not on file   Highest education level: Not  on file  Occupational History   Not on file  Tobacco Use   Smoking status: Former    Current packs/day: 0.00    Types: Cigarettes    Quit date: 10/12/2007    Years since quitting: 15.2   Smokeless tobacco: Never  Vaping Use   Vaping status: Never Used  Substance and Sexual Activity   Alcohol use: Yes    Alcohol/week: 1.0 standard drink of alcohol    Types: 1 Standard drinks or equivalent per week    Comment: with dinner GLASS OF WINE EACH DAY   Drug use: No   Sexual activity: Not on file  Other Topics Concern   Not on file  Social History Narrative   Lives in [place] with husband. 2 children, son lives nearby.Diet - regularExercise - none. 30 mins/in [place]. Quit smoking in 2009. Wine before dinner. Pianist in church; real estate; Diplomatic Services operational officer.    Social Determinants of Health   Financial Resource Strain: Low Risk  (11/21/2022)   Overall Financial Resource Strain (CARDIA)    Difficulty of Paying Living Expenses: Not hard at all  Food Insecurity: No Food Insecurity (11/21/2022)   Hunger Vital Sign    Worried About Running Out of Food in the Last Year: Never true    Ran Out of Food in the Last Year: Never true  Transportation Needs: No Transportation Needs (11/21/2022)   PRAPARE - Administrator, Civil Service (Medical): No    Lack of Transportation (Non-Medical): No  Physical Activity: Inactive (11/21/2022)   Exercise Vital Sign    Days of Exercise per Week: 0 days    Minutes of Exercise per Session: 0 min  Stress: No Stress Concern Present (11/21/2022)   [employer] of Occupational Health - Occupational Stress Questionnaire    Feeling of Stress : Not at all  Social Connections: Socially Integrated (11/21/2022)   Social Connection and Isolation Panel [NHANES]    Frequency of Communication with Friends and Family: More than three times a week    Frequency of Social Gatherings with Friends and Family: More than three times a week    Attends Religious  Services: More than 4 times per year    Active Member of Clubs or Organizations: Yes    Attends Banker Meetings: More than 4 times per year    Marital Status: Married  Catering manager Violence: Not At Risk (11/21/2022)   Humiliation, Afraid, Rape, and Kick questionnaire    Fear of Current or Ex-Partner: No    Emotionally Abused: No    Physically Abused: No    Sexually Abused: No     Allergies  Allergen Reactions   Sulfa Antibiotics Hives    As a child     CBC    Component Value Date/Time   WBC 5.1 01/13/2023 1431   WBC 5.8 07/14/2022 1505   RBC 2.72 (L) 01/13/2023 1431   HGB 10.3 (L) 01/13/2023 1431   HGB 12.4 05/15/2013 1445   HCT 30.5 (L) 01/13/2023 1431   HCT 38.2 05/15/2013 1445   PLT 128 (L) 01/13/2023 1431   PLT 255 05/15/2013 1445   MCV 112.1 (H) 01/13/2023 1431   MCV 96 05/15/2013 1445   MCH 37.9 (H) 01/13/2023 1431   MCHC 33.8 01/13/2023 1431   RDW 15.2 01/13/2023 1431   RDW 13.3 05/15/2013 1445   LYMPHSABS 1.0 01/13/2023 1431   LYMPHSABS 2.0 05/15/2013 1445   MONOABS 0.5 01/13/2023 1431   MONOABS 0.6 05/15/2013 1445   EOSABS 0.2  01/13/2023 1431   EOSABS 0.4 05/15/2013 1445   BASOSABS 0.1 01/13/2023 1431   BASOSABS 0.1 05/15/2013 1445    Pulmonary Functions Testing Results:     No data to display          Outpatient Medications Prior to Visit  Medication Sig Dispense Refill   abemaciclib (VERZENIO) 100 MG tablet Take 1 tablet (100 mg total) by mouth 2 (two) times daily. 56 tablet 0   albuterol (VENTOLIN HFA) 108 (90 Base) MCG/ACT inhaler Inhale 2 puffs into the lungs every 6 (six) hours as needed for wheezing or shortness of breath. 8 g 2   anastrozole (ARIMIDEX) 1 MG tablet TAKE 1 TABLET BY MOUTH DAILY 90 tablet 0   atorvastatin (LIPITOR) 10 MG tablet TAKE 1 TABLET BY MOUTH DAILY 90 tablet 1   Calcium Carbonate-Vit D-Min (CALCIUM 1200 PO) Take 1 tablet by mouth daily.     furosemide (LASIX) 20 MG tablet Take 1 tablet (20 mg total)  by mouth daily. Take in AM 30 tablet 0   halobetasol (ULTRAVATE) 0.05 % cream Apply topically to aa's of rash at body twice daily on weekends only as needed. Avoid applying to face, groin, and axilla. Use as directed. 50 g 1   Multiple Vitamins-Minerals (PRESERVISION AREDS 2 PO)      niacinamide 500 MG tablet Take 500 mg by mouth 2 (two) times daily.     Omega-3 Fatty Acids (FISH OIL PO) Take 1 capsule by mouth daily.     potassium chloride SA (KLOR-CON M) 20 MEQ tablet 1 pill a day. 30 tablet 0   tacrolimus (PROTOPIC) 0.1 % ointment Apply topically to affected areas of rash twice daily on week days only prn 60 g 1   umeclidinium bromide (INCRUSE ELLIPTA) 62.5 MCG/ACT AEPB Inhale 1 puff into the lungs daily. 30 each 0   VITAMIN D, CHOLECALCIFEROL, PO Take 2,000 Units by mouth daily.     guaiFENesin (MUCINEX) 600 MG 12 hr tablet Take 1 tablet (600 mg total) by mouth 2 (two) times daily as needed for cough or to loosen phlegm. (Patient not taking: Reported on 01/23/2023) 10 tablet 0   No facility-administered medications prior to visit.

## 2023-01-27 ENCOUNTER — Other Ambulatory Visit: Payer: Medicare Other

## 2023-01-27 ENCOUNTER — Ambulatory Visit: Payer: Medicare Other | Admitting: Internal Medicine

## 2023-02-01 ENCOUNTER — Encounter: Payer: Self-pay | Admitting: Internal Medicine

## 2023-02-01 ENCOUNTER — Inpatient Hospital Stay: Payer: Medicare Other | Attending: Internal Medicine

## 2023-02-01 ENCOUNTER — Inpatient Hospital Stay (HOSPITAL_BASED_OUTPATIENT_CLINIC_OR_DEPARTMENT_OTHER): Payer: Medicare Other | Admitting: Internal Medicine

## 2023-02-01 VITALS — BP 143/88 | HR 76 | Temp 98.4°F | Ht 66.0 in | Wt 170.2 lb

## 2023-02-01 DIAGNOSIS — Z79811 Long term (current) use of aromatase inhibitors: Secondary | ICD-10-CM | POA: Insufficient documentation

## 2023-02-01 DIAGNOSIS — C50811 Malignant neoplasm of overlapping sites of right female breast: Secondary | ICD-10-CM | POA: Diagnosis not present

## 2023-02-01 DIAGNOSIS — M8580 Other specified disorders of bone density and structure, unspecified site: Secondary | ICD-10-CM | POA: Diagnosis not present

## 2023-02-01 DIAGNOSIS — Z87891 Personal history of nicotine dependence: Secondary | ICD-10-CM | POA: Insufficient documentation

## 2023-02-01 DIAGNOSIS — Z170 Estrogen receptor positive status [ER+]: Secondary | ICD-10-CM | POA: Diagnosis not present

## 2023-02-01 LAB — CMP (CANCER CENTER ONLY)
ALT: 18 U/L (ref 0–44)
AST: 27 U/L (ref 15–41)
Albumin: 4.4 g/dL (ref 3.5–5.0)
Alkaline Phosphatase: 55 U/L (ref 38–126)
Anion gap: 10 (ref 5–15)
BUN: 19 mg/dL (ref 8–23)
CO2: 27 mmol/L (ref 22–32)
Calcium: 9.8 mg/dL (ref 8.9–10.3)
Chloride: 100 mmol/L (ref 98–111)
Creatinine: 1.19 mg/dL — ABNORMAL HIGH (ref 0.44–1.00)
GFR, Estimated: 46 mL/min — ABNORMAL LOW (ref >60)
Glucose, Bld: 109 mg/dL — ABNORMAL HIGH (ref 70–99)
Potassium: 3.9 mmol/L (ref 3.5–5.1)
Sodium: 137 mmol/L (ref 135–145)
Total Bilirubin: 0.8 mg/dL (ref 0.3–1.2)
Total Protein: 7.2 g/dL (ref 6.5–8.1)

## 2023-02-01 LAB — CBC (CANCER CENTER ONLY)
HCT: 33.3 % — ABNORMAL LOW (ref 36.0–46.0)
Hemoglobin: 11.4 g/dL — ABNORMAL LOW (ref 12.0–15.0)
MCH: 37.6 pg — ABNORMAL HIGH (ref 26.0–34.0)
MCHC: 34.2 g/dL (ref 30.0–36.0)
MCV: 109.9 fL — ABNORMAL HIGH (ref 80.0–100.0)
Platelet Count: 246 10*3/uL (ref 150–400)
RBC: 3.03 MIL/uL — ABNORMAL LOW (ref 3.87–5.11)
RDW: 12.9 % (ref 11.5–15.5)
WBC Count: 6.2 10*3/uL (ref 4.0–10.5)
nRBC: 0 % (ref 0.0–0.2)

## 2023-02-01 NOTE — Progress Notes (Signed)
Having test for copd with pulmonology coming up, PFT 02/09/23.  Appt with cardiology 02/23/23 .

## 2023-02-01 NOTE — Progress Notes (Signed)
[facility] CONSULT NOTE  Patient Care Team: [name], MD as PCP - General (Family Medicine) [name], MD as PCP - Cardiology (Cardiology) [name], [name], MD as Consulting Physician (General Surgery) [name], MD (Internal Medicine) [name], RN as Oncology Nurse Navigator [name], MD as Consulting Physician (Radiation Oncology) [name], MD as Consulting Physician (Internal Medicine) Sung Amabile, DO as Consulting Physician (General Surgery)  CHIEF COMPLAINTS/PURPOSE OF CONSULTATION: Breast cancer  #  Oncology History Overview Note  # 2009- Sigmoid colon cancer stage II (T3 N0 4 lymph nodes sampled M0 stage II high risk there was obstruction and perforation abscess; Dr.Hern), workup included a low CEA preoperatively and a chest x-ray and CT scan abdomen pelvis negative for metastatic disease, S/P colostomy; s/p FOLFOX x 6 months. [Dr.Gittin ]  # LATER REVERSED , K RAS  wild type B RAF not evaluated   Also initial iron deficiency with anemia thrombocytosis, high platelets considered reactive, workup includes a normal PCR for CML and a negative [id number] mutation  DIAGNOSIS:  A. BREAST, RIGHT; LUMPECTOMY:  - INVASIVE MAMMARY CARCINOMA.  - DUCTAL CARCINOMA IN SITU (DCIS).  - SEE CANCER SUMMARY BELOW.  - BIOPSY SITE CHANGE WITH CLIP (2).  - FIBROUS TISSUE WITH CYST FORMATION AND FLORID DUCTAL HYPERPLASIA.  - SKIN AND NIPPLE NEGATIVE FOR MALIGNANCY.  - VASCULAR CALCIFICATION.   B. SENTINEL LYMPH NODES 1 AND 2, RIGHT AXILLA; EXCISION:  - METASTATIC CARCINOMA INVOLVES ONE OF TWO LYMPH NODES (1/2).   C. NON-SENTINEL LYMPH NODES, RIGHT AXILLA; EXCISION:  - ONE LYMPH NODE NEGATIVE FOR MALIGNANCY (0/1).   CANCER CASE SUMMARY: INVASIVE CARCINOMA OF THE BREAST  Standard(s): AJCC-UICC 8   SPECIMEN  Procedure: Lumpectomy  Specimen Laterality: Right   TUMOR  Histologic Type: Invasive carcinoma of no special type  (ductal)  Histologic Grade (Nottingham Histologic Score)       Glandular (Acinar)/Tubular Differentiation: 3       Nuclear Pleomorphism: 3       Mitotic Rate: 3       Overall Grade: 3  Tumor Size: 24 mm  Tumor Focality: Single focus of invasive carcinoma  Ductal Carcinoma In Situ (DCIS): Present, nuclear grade 2-3  Tumor Extent: Not applicable  Lymphatic and/or Vascular Invasion: Present  Treatment Effect in the Breast: No known presurgical therapy   MARGINS  Margin Status for Invasive Carcinoma: All margins negative for invasive  carcinoma       Distance from closest margin: 10 mm       Specify closest margin: Superior   Margin Status for DCIS: All margins negative for DCIS       Distance from DCIS to closest margin: 10 mm       Specify closest margin: Superior   REGIONAL LYMPH NODES  Regional Lymph Node Status: Tumor present in regional lymph node(s)       Number of Lymph Nodes with Macrometastases (greater than 2 mm): 1       Number of Lymph Nodes with Micrometastases (greater than 0.2 mm to  2 mm and/or greater than 200 cells): 0       Number of Lymph Nodes with Isolated Tumor Cells (0.2 mm or less OR  200 cells or less): 0       Size of Largest Metastatic Deposit: 3 mm      Extranodal Extension: Not identified       Total Number of Lymph Nodes Examined (sentinel and non-sentinel): 3  Number of Sentinel Nodes Examined: 2   DISTANT METASTASIS  Distant Site(s) Involved, if applicable: Not applicable   PATHOLOGIC STAGE CLASSIFICATION (pTNM, AJCC 8th Edition):  Modified Classification: Not applicable  pT Category: pT2  T Suffix: Not applicable  pN Category: pN1a  N Suffix: (sn)  pM Category: Not applicable   SPECIAL STUDIES  Breast Biomarker Testing Performed on Previous Outside Biopsy:  23-250-T11  Per outside report:  Estrogen Receptor (ER) Status: POSITIVE          Percentage of cells with nuclear positivity: Greater than 90%          Average intensity of  staining: Strong   Progesterone Receptor (PgR) Status: POSITIVE          Percentage of cells with nuclear positivity: 60%          Average intensity of staining: Strong   HER2 (by immunohistochemistry): EQUIVOCAL (Score 2+)  HER2 FISH: NEGATIVE   Ki-67: Not performed   # Cytoxan Taxotere x 4 cycles; finished JAN, 2024. [Neutropenic fevers s/p cycle #4;  -FEB 2nd, 2024-right lung PE; left lower LE  DVT- on eliquis.   # FEB 19th-start RT  IMPRESSION: 1. Complex cystic and solid-appearing mass within the subareolar RIGHT breast, overall measuring 3.1 cm greatest dimension, the solid enhancing component at the anterior-lateral aspect of the mass measuring 2 x 1 cm, abutting the overlying skin but without evidence of involvement/enhancement of the skin. Susceptibility artifact within the mass may represent a biopsy clip (if biopsy has been performed at an outside site since the diagnostic mammogram/ultrasound of 12/28/2021) or this artifact could represent calcifications within the mass. This mass corresponds to the recent ultrasound finding of a complex irregular mass at the 11 o'clock axis of the retroareolar RIGHT breast. 2. No evidence of multifocal or multicentric disease within the RIGHT breast. 3. No evidence of contralateral disease in the LEFT breast.   RECOMMENDATION: If has not already been performed, recommend ultrasound-guided biopsy of the suspicious mass in the subareolar RIGHT breast, with preferential targeting of the anterior-lateral component of the mass which is shown to be the enhancing component on today's MRI.   BI-RADS CATEGORY  4: Suspicious.     Cancer of sigmoid (HCC) (Resolved)  Carcinoma of overlapping sites of right breast in female, estrogen receptor positive (HCC)  02/14/2022 Initial Diagnosis   Carcinoma of overlapping sites of right breast in female, estrogen receptor positive (HCC)   02/14/2022 Cancer Staging   Staging form: Breast, AJCC  8th Edition - Pathologic: Stage IIA (pT2, pN1, cM0, G3, ER+, PR+, HER2-) - Signed by [name], MD on 02/14/2022 Histologic grading system: 3 grade system   02/23/2022 -  Chemotherapy   Patient is on Treatment Plan : BREAST TC q21d      HISTORY OF PRESENTING ILLNESS:   Alone.  Ambulating independently.   Sabrina Wilson 82 y.o.  female patient with ER/PR positive Her 2 Negative  breast cancer stage II T2 N1-currently  adjuvant anastrozole; and verzenio [started second week of June 2024] is here for a follow up.  Currently of verzinio for last 2 weeks. Significant improvement of fatigue; and dyspnea improved. On inhalers.  Appt with cardiology 10/31/2  Her leg swelling is improved overall, but not resolved..  Denies any blood in the black or stools.  Review of Systems  Constitutional:  Positive for malaise/fatigue and weight loss. Negative for chills, diaphoresis and fever.  HENT:  Negative for nosebleeds and sore throat.  Eyes:  Negative for double vision.  Respiratory:  Positive for cough and shortness of breath. Negative for hemoptysis, sputum production and wheezing.   Cardiovascular:  Negative for chest pain, palpitations, orthopnea and leg swelling.  Gastrointestinal:  Positive for abdominal pain and nausea. Negative for blood in stool, constipation, diarrhea, heartburn, melena and vomiting.  Genitourinary:  Negative for dysuria, frequency and urgency.  Musculoskeletal:  Negative for back pain and joint pain.  Skin: Negative.  Negative for itching and rash.  Neurological:  Negative for dizziness, tingling, focal weakness, weakness and headaches.  Endo/Heme/Allergies:  Does not bruise/bleed easily.  Psychiatric/Behavioral:  Negative for depression. The patient is not nervous/anxious and does not have insomnia.      MEDICAL HISTORY:  Past Medical History:  Diagnosis Date   Actinic keratosis 02/12/2020   R lat calf (hypertrophic)   Arthritis    Cancer of sigmoid  (HCC) 06/17/2016   Colon cancer (HCC) 2009   T3,N0; s/p resection  Dr. Earnestine Leys and chemotherapy by Dr. Neale Burly   Diastolic dysfunction    a. 08/2020 Echo: EF 60-65%, no rwma, Gr1 DD, nl RV size/fxn. Mild MR.   Hernia of flank    History of actinic keratoses 08/11/2020   Bx proven at left lateral ankle proximal, LN2 10/08/20   History of stress test    a. 09/2020 MV: EF >65%. No ischemia/infarct. Mild Ao Ca2+ and minimal Cor Ca2+.   Hypertension    Neuropathy    Polyp at cervical os    s/p resection Dr. Hyacinth Meeker   Skin cancer 01/22/2020   surface of an atypical verrucous squamous proliferation    Squamous cell carcinoma of skin 07/16/2020   Left lateral ankle anterior, treated with [facility] 08-11-2020    SURGICAL HISTORY: Past Surgical History:  Procedure Laterality Date   BREAST BIOPSY Right 05/2014   Dr. Rutherford Nail office-benign   BREAST LUMPECTOMY WITH SENTINEL LYMPH NODE BIOPSY Right 01/21/2022   Procedure: BREAST LUMPECTOMY WITH SENTINEL LYMPH NODE BX;  Surgeon: [name], MD;  Location: [facility] ORS;  Service: General;  Laterality: Right;   BREAST SURGERY Right February 2016   Vacuum assisted biopsy for recurrent cyst, fibrocystic changes without evidence of malignancy.   CATARACT EXTRACTION W/PHACO Left 01/13/2015   Procedure: CATARACT EXTRACTION PHACO AND INTRAOCULAR LENS PLACEMENT (IOC);  Surgeon: Galen Manila, MD;  Location: [facility] ORS;  Service: Ophthalmology;  Laterality: Left;  [place]  1:02.9 AP   19.2 CDE  12.06 casette lot #  [id number]   CATARACT EXTRACTION W/PHACO Right 02/03/2015   Procedure: CATARACT EXTRACTION PHACO AND INTRAOCULAR LENS PLACEMENT (IOC);  Surgeon: Galen Manila, MD;  Location: [facility] ORS;  Service: Ophthalmology;  Laterality: Right;  [id number]:53 ap46.5 [id number]   COLECTOMY     COLONOSCOPY  2015   Dr Bluford Kaufmann   COLONOSCOPY WITH PROPOFOL N/A 09/13/2017   Procedure: COLONOSCOPY WITH PROPOFOL;  Surgeon: [name], MD;  Location: [facility] ENDOSCOPY;  Service:  Endoscopy;  Laterality: N/A;   COLONOSCOPY WITH PROPOFOL N/A 11/08/2017   Procedure: COLONOSCOPY WITH PROPOFOL;  Surgeon: [name], MD;  Location: [facility] ENDOSCOPY;  Service: Endoscopy;  Laterality: N/A;   colostomy reversal     DILATION AND CURETTAGE OF UTERUS  2014   Dr. Hyacinth Meeker, benign per pt   EYE SURGERY     HERNIA REPAIR  07/13/12   ventral    SKIN CANCER EXCISION     TONSILLECTOMY      SOCIAL HISTORY: Social History   Socioeconomic History  Marital status: Married    Spouse name: Not on file   Number of children: Not on file   Years of education: Not on file   Highest education level: Not on file  Occupational History   Not on file  Tobacco Use   Smoking status: Former    Current packs/day: 0.00    Types: Cigarettes    Quit date: 10/12/2007    Years since quitting: 15.3   Smokeless tobacco: Never  Vaping Use   Vaping status: Never Used  Substance and Sexual Activity   Alcohol use: Yes    Alcohol/week: 1.0 standard drink of alcohol    Types: 1 Standard drinks or equivalent per week    Comment: with dinner GLASS OF WINE EACH DAY   Drug use: No   Sexual activity: Not on file  Other Topics Concern   Not on file  Social History Narrative   Lives in [place] with husband. 2 children, son lives nearby.Diet - regularExercise - none. 30 mins/in . Quit smoking in 2009. Wine before dinner. Pianist in church; real estate; Diplomatic Services operational officer.    Social Determinants of Health   Financial Resource Strain: Low Risk  (11/21/2022)   Overall Financial Resource Strain (CARDIA)    Difficulty of Paying Living Expenses: Not hard at all  Food Insecurity: No Food Insecurity (11/21/2022)   Hunger Vital Sign    Worried About Running Out of Food in the Last Year: Never true    Ran Out of Food in the Last Year: Never true  Transportation Needs: No Transportation Needs (11/21/2022)   PRAPARE - Administrator, Civil Service (Medical): No    Lack of Transportation  (Non-Medical): No  Physical Activity: Inactive (11/21/2022)   Exercise Vital Sign    Days of Exercise per Week: 0 days    Minutes of Exercise per Session: 0 min  Stress: No Stress Concern Present (11/21/2022)   [employer] of Occupational Health - Occupational Stress Questionnaire    Feeling of Stress : Not at all  Social Connections: Socially Integrated (11/21/2022)   Social Connection and Isolation Panel [NHANES]    Frequency of Communication with Friends and Family: More than three times a week    Frequency of Social Gatherings with Friends and Family: More than three times a week    Attends Religious Services: More than 4 times per year    Active Member of [employer] or Organizations: Yes    Attends Banker Meetings: More than 4 times per year    Marital Status: Married  Catering manager Violence: Not At Risk (11/21/2022)   Humiliation, Afraid, Rape, and Kick questionnaire    Fear of Current or Ex-Partner: No    Emotionally Abused: No    Physically Abused: No    Sexually Abused: No    FAMILY HISTORY: Family History  Problem Relation Age of Onset   Stroke Mother    Diabetes Mother    Colon cancer Father        dx 55s   Stroke Sister    Colon cancer Brother        dx 59s-70s   Brain cancer Brother        dx 60s-70s    ALLERGIES:  is allergic to sulfa antibiotics.  MEDICATIONS:  Current Outpatient Medications  Medication Sig Dispense Refill   abemaciclib (VERZENIO) 100 MG tablet Take 1 tablet (100 mg total) by mouth 2 (two) times daily. 56 tablet 0   anastrozole (ARIMIDEX) 1  MG tablet TAKE 1 TABLET BY MOUTH DAILY 90 tablet 0   atorvastatin (LIPITOR) 10 MG tablet TAKE 1 TABLET BY MOUTH DAILY 90 tablet 1   Budeson-Glycopyrrol-Formoterol (BREZTRI AEROSPHERE) 160-9-4.8 MCG/ACT AERO Inhale 2 puffs into the lungs in the morning and at bedtime. 2 each    Calcium Carbonate-Vit D-Min (CALCIUM 1200 PO) Take 1 tablet by mouth daily.     furosemide (LASIX) 20 MG tablet  Take 1 tablet (20 mg total) by mouth daily. Take in AM 30 tablet 0   halobetasol (ULTRAVATE) 0.05 % cream Apply topically to aa's of rash at body twice daily on weekends only as needed. Avoid applying to face, groin, and axilla. Use as directed. 50 g 1   loratadine (CLARITIN) 10 MG tablet Take 10 mg by mouth daily.     Multiple Vitamins-Minerals (PRESERVISION AREDS 2 PO)      niacinamide 500 MG tablet Take 500 mg by mouth 2 (two) times daily.     Omega-3 Fatty Acids (FISH OIL PO) Take 1 capsule by mouth daily.     potassium chloride SA (KLOR-CON M) 20 MEQ tablet 1 pill a day. 30 tablet 0   tacrolimus (PROTOPIC) 0.1 % ointment Apply topically to affected areas of rash twice daily on week days only prn 60 g 1   umeclidinium bromide (INCRUSE ELLIPTA) 62.5 MCG/ACT AEPB Inhale 1 puff into the lungs daily. 30 each 0   VITAMIN D, CHOLECALCIFEROL, PO Take 2,000 Units by mouth daily.     albuterol (VENTOLIN HFA) 108 (90 Base) MCG/ACT inhaler Inhale 2 puffs into the lungs every 6 (six) hours as needed for wheezing or shortness of breath. (Patient not taking: Reported on 02/01/2023) 8 g 2   guaiFENesin (MUCINEX) 600 MG 12 hr tablet Take 1 tablet (600 mg total) by mouth 2 (two) times daily as needed for cough or to loosen phlegm. (Patient not taking: Reported on 01/23/2023) 10 tablet 0   No current facility-administered medications for this visit.    PHYSICAL EXAMINATION:   Vitals:   02/01/23 0933 02/01/23 0946  BP: (!) 156/84 (!) 143/88  Pulse: 76   Temp: 98.4 F (36.9 C)   SpO2: 98%     Filed Weights   02/01/23 0933  Weight: 170 lb 3.2 oz (77.2 kg)     Physical Exam Vitals and nursing note reviewed.  HENT:     Head: Normocephalic and atraumatic.     Mouth/Throat:     Pharynx: Oropharynx is clear.  Eyes:     Extraocular Movements: Extraocular movements intact.     Pupils: Pupils are equal, round, and reactive to light.  Cardiovascular:     Rate and Rhythm: Normal rate and regular  rhythm.  Pulmonary:     Comments: Decreased breath sounds bilaterally.  Abdominal:     Palpations: Abdomen is soft.  Musculoskeletal:        General: Normal range of motion.     Cervical back: Normal range of motion.  Skin:    General: Skin is warm.  Neurological:     General: No focal deficit present.     Mental Status: She is alert and oriented to person, place, and time.  Psychiatric:        Behavior: Behavior normal.        Judgment: Judgment normal.      LABORATORY DATA:  I have reviewed the data as listed Lab Results  Component Value Date   WBC 6.2 02/01/2023   HGB 11.4 (L)  02/01/2023   HCT 33.3 (L) 02/01/2023   MCV 109.9 (H) 02/01/2023   PLT 246 02/01/2023   Recent Labs    11/16/22 1433 01/13/23 1432 02/01/23 0920  NA 136 140 137  K 3.9 3.7 3.9  CL 103 106 100  CO2 26 25 27   GLUCOSE 112* 100* 109*  BUN 25* 20 19  CREATININE 1.14* 0.98 1.19*  CALCIUM 9.6 10.0 9.8  GFRNONAA 49* 59* 46*  PROT 6.5 6.8 7.2  ALBUMIN 4.1 4.3 4.4  AST 19 20 27   ALT 13 15 18   ALKPHOS 51 44 55  BILITOT 0.6 0.9 0.8    RADIOGRAPHIC STUDIES: I have personally reviewed the radiological images as listed and agreed with the findings in the report. DG Chest 2 View  Result Date: 01/13/2023 CLINICAL DATA:  Cough and shortness of breath. EXAM: CHEST - 2 VIEW COMPARISON:  05/30/2022 FINDINGS: Lungs are adequately inflated with mild flattening of the hemidiaphragms. No focal lobar consolidation or effusion. Cardiomediastinal silhouette and remainder of the exam is unchanged. IMPRESSION: No acute cardiopulmonary disease. Electronically Signed   By: Elberta Fortis M.D.   On: 01/13/2023 16:48    ASSESSMENT & PLAN:   Carcinoma of overlapping sites of right breast in female, estrogen receptor positive (HCC) # Right breast cancer -[facility]; ER/PR positive Her 2 Negative ;  G-3; LVI + T2N1.  Stage II [OCT 2023; Dr.Byrnett] s/p Lumpectomy; node positive-Oncotype RS- 35- s/p  adjuvant chemotherapy with  Taxotere-Cytoxan every 3 weeks x4 cycles.  S/p postlumpectomy radiation April 3rd 2024.   # Currently on anastrozole once a day [April 2024]-tolerating well without any major side effects. Poor tolerance to adjuvant abemaciclib 100 mg BID [started second week of june-given ongoing fatigue/dyspnea-see below.  Recommend CONTINUE HOLDING abemaciclib at this time. Consider starting at reduced dose at next visit. Adjuvant zometa discuss.   # END SEP 2024] Worsening dyspnea  2D echo 2022- EF- preserved; currently awaiting evaluation follow up with Cardiology; Dr.Etaang. Currently dyspnea improved- -s/p Lasix 20 mg a day/Kdur-.recommend lasix/ kdur 20/20 every other day. Recocmmdm compression stocking. Defer further to cardiology.   # Diarrhea- Improved with immoidum pnr.   # [2nd,FEB 2024] CTA-  acute pulmonary embolism, with new segmental to subsegmental embolus present in the right lower lobe/right lower lobe-pulmonary infarct. LEFT LE- Acute DVT-on anticoagulation with eliquis on 5 mg BID X 6 months- [provoked sec to chemo]. Stop eliquis in End of  AUG 2024.    # Bone health-2023 osteopenic T-score of -1.1.Continue calcium plus vitamin D.- stable.  I discussed the role of adjuvant Zometa every 6 months x 3 years. Awaiting dental evaluation [[facility] vs others- will send referral if needed].  Continue ca+Vit D BID.   # Genetic testing: brother-& father- colon cancer; other brother- brain tumor; no breast cancers in family. pending genetic blood work today.   # Right arm decreased range of motion/swelling- ?  Lymphedema- s/p evaluation- Maureen/OT- stable.   # IV Access: PIV   # DISPOSITION: # follow in  6 weeks- -  MD;labs- cbc/cmp; BNP- Dr.B   All questions were answered. The patient/family knows to call the clinic with any problems, questions or concerns.    [name], MD 02/01/2023 10:34 AM

## 2023-02-01 NOTE — Patient Instructions (Signed)
Take lasix/potassium pill-1 each every other day.

## 2023-02-01 NOTE — Assessment & Plan Note (Signed)
#   Right breast cancer -[facility]; ER/PR positive Her 2 Negative ;  G-3; LVI + T2N1.  Stage II [OCT 2023; Dr.Byrnett] s/p Lumpectomy; node positive-Oncotype RS- 35- s/p  adjuvant chemotherapy with Taxotere-Cytoxan every 3 weeks x4 cycles.  S/p postlumpectomy radiation April 3rd 2024.   # Currently on anastrozole once a day [April 2024]-tolerating well without any major side effects. Poor tolerance to adjuvant abemaciclib 100 mg BID [started second week of june-given ongoing fatigue/dyspnea-see below.  Recommend CONTINUE HOLDING abemaciclib at this time. Consider starting at reduced dose at next visit. Adjuvant zometa discuss.   # END SEP 2024] Worsening dyspnea  2D echo 2022- EF- preserved; currently awaiting evaluation follow up with Cardiology; Dr.Etaang. Currently dyspnea improved- -s/p Lasix 20 mg a day/Kdur-.recommend lasix/ kdur 20/20 every other day. Recocmmdm compression stocking. Defer further to cardiology.   # Diarrhea- Improved with immoidum pnr.   # [2nd,FEB 2024] CTA-  acute pulmonary embolism, with new segmental to subsegmental embolus present in the right lower lobe/right lower lobe-pulmonary infarct. LEFT LE- Acute DVT-on anticoagulation with eliquis on 5 mg BID X 6 months- [provoked sec to chemo]. Stop eliquis in End of  AUG 2024.    # Bone health-2023 osteopenic T-score of -1.1.Continue calcium plus vitamin D.- stable.  I discussed the role of adjuvant Zometa every 6 months x 3 years. Awaiting dental evaluation [[facility] vs others- will send referral if needed].  Continue ca+Vit D BID.   # Genetic testing: brother-& father- colon cancer; other brother- brain tumor; no breast cancers in family. pending genetic blood work today.   # Right arm decreased range of motion/swelling- ?  Lymphedema- s/p evaluation- Maureen/OT- stable.   # IV Access: PIV   # DISPOSITION: # follow in  6 weeks- -  MD;labs- cbc/cmp; BNP- Dr.B

## 2023-02-02 ENCOUNTER — Other Ambulatory Visit: Payer: Self-pay

## 2023-02-02 ENCOUNTER — Other Ambulatory Visit: Payer: Self-pay | Admitting: Internal Medicine

## 2023-02-02 DIAGNOSIS — H02831 Dermatochalasis of right upper eyelid: Secondary | ICD-10-CM | POA: Diagnosis not present

## 2023-02-03 ENCOUNTER — Encounter: Payer: Self-pay | Admitting: Cardiology

## 2023-02-03 ENCOUNTER — Ambulatory Visit: Payer: Medicare Other | Attending: Cardiology | Admitting: Cardiology

## 2023-02-03 VITALS — BP 140/80 | HR 82 | Ht 66.0 in | Wt 170.4 lb

## 2023-02-03 DIAGNOSIS — R5382 Chronic fatigue, unspecified: Secondary | ICD-10-CM | POA: Diagnosis not present

## 2023-02-03 DIAGNOSIS — I10 Essential (primary) hypertension: Secondary | ICD-10-CM

## 2023-02-03 DIAGNOSIS — R0602 Shortness of breath: Secondary | ICD-10-CM | POA: Diagnosis not present

## 2023-02-03 NOTE — Patient Instructions (Signed)
Medication Instructions:   Your physician recommends that you continue on your current medications as directed. Please refer to the Current Medication list given to you today.  *If you need a refill on your cardiac medications before your next appointment, please call your pharmacy*   Lab Work:  None Ordered  If you have labs (blood work) drawn today and your tests are completely normal, you will receive your results only by: MyChart Message (if you have MyChart) OR A paper copy in the mail If you have any lab test that is abnormal or we need to change your treatment, we will call you to review the results.   Testing/Procedures:  Your physician has requested that you have an echocardiogram. Echocardiography is a painless test that uses sound waves to create images of your heart. It provides your doctor with information about the size and shape of your heart and how well your heart's chambers and valves are working. This procedure takes approximately one hour. There are no restrictions for this procedure. Please do NOT wear cologne, perfume, aftershave, or lotions (deodorant is allowed). Please arrive 15 minutes prior to your appointment time.    Follow-Up: At [facility], you and your health needs are our priority.  As part of our continuing mission to provide you with exceptional heart care, we have created designated Provider Care Teams.  These Care Teams include your primary Cardiologist (physician) and Advanced Practice Providers (APPs -  Physician Assistants and Nurse Practitioners) who all work together to provide you with the care you need, when you need it.  We recommend signing up for the patient portal called "MyChart".  Sign up information is provided on this After Visit Summary.  MyChart is used to connect with patients for Virtual Visits (Telemedicine).  Patients are able to view lab/test results, encounter notes, upcoming appointments, etc.  Non-urgent messages can  be sent to your provider as well.   To learn more about what you can do with MyChart, go to [email or web address].    Your next appointment:   6 month(s)  Provider:   You may see Brian Agbor-Etang, MD or one of the following Advanced Practice Providers on your designated Care Team:   Christopher Berge, NP Ryan Dunn, PA-C Cadence Furth, PA-C Sheri Hammock, NP 

## 2023-02-03 NOTE — Progress Notes (Signed)
Cardiology Office Note:    Date:  02/03/2023   ID:  Sabrina Wilson, DOB 1941/01/13, MRN [id number]  PCP:  Sabrina Luis, MD   [facility] Providers Cardiologist:  [name], MD     Referring MD: Sabrina Luis, MD   No chief complaint on file.   History of Present Illness:    Sabrina Wilson is a 82 y.o. female with a hx of hypertension, hyperlipidemia, breast cancer s/p right lumpectomy 9/23 +chemo who presents with shortness of breath.  Diagnosed with breast cancer last year, underwent chemo x 3 months.  States starting Verzenio for maintenance per oncology team.  Had significant fatigue/shortness of breath while on Verzenio.  Verzenio has been held with improvement in her symptoms.  States feeling like a brand-new person.  Oncology team wanted to make sure cardiac function was stable.  Last seen in 2022 with symptoms of shortness of breath, workup with echocardiogram and Lexiscan Myoview was unrevealing.  Blood pressures are adequately controlled at home with systolics usually in the 120s, with rare 140s.  Prior notes/tests [employer] 09/2020 no ischemia, low risk study Echo 08/2020 EF 60 to 65%  Past Medical History:  Diagnosis Date   Actinic keratosis 02/12/2020   R lat calf (hypertrophic)   Arthritis    Cancer of sigmoid (HCC) 06/17/2016   Colon cancer (HCC) 2009   T3,N0; s/p resection  Dr. Earnestine Leys and chemotherapy by Dr. Neale Burly   Diastolic dysfunction    a. 08/2020 Echo: EF 60-65%, no rwma, Gr1 DD, nl RV size/fxn. Mild MR.   Hernia of flank    History of actinic keratoses 08/11/2020   Bx proven at left lateral ankle proximal, LN2 10/08/20   History of stress test    a. 09/2020 MV: EF >65%. No ischemia/infarct. Mild Ao Ca2+ and minimal Cor Ca2+.   Hypertension    Neuropathy    Polyp at cervical os    s/p resection Dr. Hyacinth Meeker   Skin cancer 01/22/2020   surface of an atypical verrucous squamous proliferation    Squamous cell carcinoma of skin  07/16/2020   Left lateral ankle anterior, treated with [facility] 08-11-2020    Past Surgical History:  Procedure Laterality Date   BREAST BIOPSY Right 05/2014   Dr. Rutherford Nail office-benign   BREAST LUMPECTOMY WITH SENTINEL LYMPH NODE BIOPSY Right 01/21/2022   Procedure: BREAST LUMPECTOMY WITH SENTINEL LYMPH NODE BX;  Surgeon: [name], MD;  Location: [facility] ORS;  Service: General;  Laterality: Right;   BREAST SURGERY Right February 2016   Vacuum assisted biopsy for recurrent cyst, fibrocystic changes without evidence of malignancy.   CATARACT EXTRACTION W/PHACO Left 01/13/2015   Procedure: CATARACT EXTRACTION PHACO AND INTRAOCULAR LENS PLACEMENT (IOC);  Surgeon: Galen Manila, MD;  Location: [facility] ORS;  Service: Ophthalmology;  Laterality: Left;  [place]  1:02.9 AP   19.2 CDE  12.06 casette lot #  [id number]   CATARACT EXTRACTION W/PHACO Right 02/03/2015   Procedure: CATARACT EXTRACTION PHACO AND INTRAOCULAR LENS PLACEMENT (IOC);  Surgeon: Galen Manila, MD;  Location: [facility] ORS;  Service: Ophthalmology;  Laterality: Right;  [id number] ap46.5 [id number]   COLECTOMY     COLONOSCOPY  2015   Dr Bluford Kaufmann   COLONOSCOPY WITH PROPOFOL N/A 09/13/2017   Procedure: COLONOSCOPY WITH PROPOFOL;  Surgeon: [name], MD;  Location: [facility];  Service: Endoscopy;  Laterality: N/A;   COLONOSCOPY WITH PROPOFOL N/A 11/08/2017   Procedure: COLONOSCOPY WITH PROPOFOL;  Surgeon: [name], MD;  Location: [facility] ENDOSCOPY;  Service: Endoscopy;  Laterality: N/A;   colostomy reversal     DILATION AND CURETTAGE OF UTERUS  2014   Dr. Hyacinth Meeker, benign per pt   EYE SURGERY     HERNIA REPAIR  07/13/12   ventral    SKIN CANCER EXCISION     TONSILLECTOMY      Current Medications: Current Meds  Medication Sig   abemaciclib (VERZENIO) 100 MG tablet Take 1 tablet (100 mg total) by mouth 2 (two) times daily.   albuterol (VENTOLIN HFA) 108 (90 Base) MCG/ACT inhaler Inhale 2 puffs into the lungs every 6 (six)  hours as needed for wheezing or shortness of breath.   anastrozole (ARIMIDEX) 1 MG tablet TAKE 1 TABLET BY MOUTH DAILY   atorvastatin (LIPITOR) 10 MG tablet TAKE 1 TABLET BY MOUTH DAILY   Budeson-Glycopyrrol-Formoterol (BREZTRI AEROSPHERE) 160-9-4.8 MCG/ACT AERO Inhale 2 puffs into the lungs in the morning and at bedtime.   Calcium Carbonate-Vit D-Min (CALCIUM 1200 PO) Take 1 tablet by mouth daily.   furosemide (LASIX) 20 MG tablet Take 1 tablet (20 mg total) by mouth daily. Take in AM   halobetasol (ULTRAVATE) 0.05 % cream Apply topically to aa's of rash at body twice daily on weekends only as needed. Avoid applying to face, groin, and axilla. Use as directed.   loratadine (CLARITIN) 10 MG tablet Take 10 mg by mouth daily.   Multiple Vitamins-Minerals (PRESERVISION AREDS 2 PO)    niacinamide 500 MG tablet Take 500 mg by mouth 2 (two) times daily.   Omega-3 Fatty Acids (FISH OIL PO) Take 1 capsule by mouth daily.   potassium chloride SA (KLOR-CON M) 20 MEQ tablet 1 pill a day.   tacrolimus (PROTOPIC) 0.1 % ointment Apply topically to affected areas of rash twice daily on week days only prn   VITAMIN D, CHOLECALCIFEROL, PO Take 2,000 Units by mouth daily.     Allergies:   Sulfa antibiotics   Social History   Socioeconomic History   Marital status: Married    Spouse name: Not on file   Number of children: Not on file   Years of education: Not on file   Highest education level: Not on file  Occupational History   Not on file  Tobacco Use   Smoking status: Former    Current packs/day: 0.00    Types: Cigarettes    Quit date: 10/12/2007    Years since quitting: 15.3   Smokeless tobacco: Never  Vaping Use   Vaping status: Never Used  Substance and Sexual Activity   Alcohol use: Yes    Alcohol/week: 1.0 standard drink of alcohol    Types: 1 Standard drinks or equivalent per week    Comment: with dinner GLASS OF WINE EACH DAY   Drug use: No   Sexual activity: Not on file  Other  Topics Concern   Not on file  Social History Narrative   Lives in [place] with husband. 2 children, son lives nearby.Diet - regularExercise - none. 30 mins/in . Quit smoking in 2009. Wine before dinner. Pianist in church; real estate; Diplomatic Services operational officer.    Social Determinants of Health   Financial Resource Strain: Low Risk  (11/21/2022)   Overall Financial Resource Strain (CARDIA)    Difficulty of Paying Living Expenses: Not hard at all  Food Insecurity: No Food Insecurity (11/21/2022)   Hunger Vital Sign    Worried About Running Out of Food in the Last Year: Never true    Ran  Out of Food in the Last Year: Never true  Transportation Needs: No Transportation Needs (11/21/2022)   PRAPARE - Administrator, Civil Service (Medical): No    Lack of Transportation (Non-Medical): No  Physical Activity: Inactive (11/21/2022)   Exercise Vital Sign    Days of Exercise per Week: 0 days    Minutes of Exercise per Session: 0 min  Stress: No Stress Concern Present (11/21/2022)   [employer] of Occupational Health - Occupational Stress Questionnaire    Feeling of Stress : Not at all  Social Connections: Socially Integrated (11/21/2022)   Social Connection and Isolation Panel [NHANES]    Frequency of Communication with Friends and Family: More than three times a week    Frequency of Social Gatherings with Friends and Family: More than three times a week    Attends Religious Services: More than 4 times per year    Active Member of [employer] or Organizations: Yes    Attends Engineer, structural: More than 4 times per year    Marital Status: Married     Family History: The patient's family history includes Brain cancer in her brother; Colon cancer in her brother and father; Diabetes in her mother; Stroke in her mother and sister.  ROS:   Please see the history of present illness.     All other systems reviewed and are negative.  EKGs/Labs/Other Studies Reviewed:    The  following studies were reviewed today:  EKG Interpretation Date/Time:  Friday February 03 2023 10:39:59 EDT Ventricular Rate:  82 PR Interval:  182 QRS Duration:  88 QT Interval:  404 QTC Calculation: 472 R Axis:   -51  Text Interpretation: Sinus rhythm with Premature atrial complexes with Abberant conduction Left axis deviation Pulmonary disease pattern Confirmed by [name] ([id number]) on 02/03/2023 10:59:04 AM    Recent Labs: 06/08/2022: Magnesium 2.1 01/13/2023: B Natriuretic Peptide 316.5 02/01/2023: ALT 18; BUN 19; Creatinine 1.19; Hemoglobin 11.4; Platelet Count 246; Potassium 3.9; Sodium 137  Recent Lipid Panel    Component Value Date/Time   CHOL 201 (H) 05/24/2021 1447   TRIG 65.0 05/24/2021 1447   HDL 110.70 05/24/2021 1447   CHOLHDL 2 05/24/2021 1447   VLDL 13.0 05/24/2021 1447   LDLCALC 77 05/24/2021 1447   LDLDIRECT 66.0 09/08/2017 0925     Risk Assessment/Calculations:      Physical Exam:    VS:  BP (!) 140/80   Pulse 82   Ht 5\' 6"  (1.676 m)   Wt 170 lb 6.4 oz (77.3 kg)   SpO2 96%   BMI 27.50 kg/m     Wt Readings from Last 3 Encounters:  02/03/23 170 lb 6.4 oz (77.3 kg)  02/01/23 170 lb 3.2 oz (77.2 kg)  01/23/23 169 lb 6.4 oz (76.8 kg)     GEN:  Well nourished, well developed in no acute distress HEENT: Normal NECK: No JVD; No carotid bruits CARDIAC: RRR, no murmurs, rubs, gallops RESPIRATORY:  Clear to auscultation without rales, wheezing or rhonchi  ABDOMEN: Soft, non-tender, non-distended MUSCULOSKELETAL:  No edema; No deformity  SKIN: Warm and dry NEUROLOGIC:  Alert and oriented x 3 PSYCHIATRIC:  Normal affect   ASSESSMENT:    1. SOB (shortness of breath)   2. Primary hypertension   3. Chronic fatigue    PLAN:    In order of problems listed above:  Shortness of breath associated with breast cancer regimen.  Improved with holding Verzenio.  Previous echocardiogram in 2020 with  normal systolic function.  Repeat echo to evaluate  any structural abnormalities.  Oncology regimen likely contributing to symptoms of shortness of breath. Hypertension, BP reasonably controlled off BP meds.  Monitor for now. Hyperlipidemia, continue Lipitor 10 mg daily.  Follow-up after echocardiogram.   Medication Adjustments/Labs and Tests Ordered: Current medicines are reviewed at length with the patient today.  Concerns regarding medicines are outlined above.  Orders Placed This Encounter  Procedures   EKG 12-Lead   ECHOCARDIOGRAM COMPLETE   No orders of the defined types were placed in this encounter.   Patient Instructions  .Medication Instructions:   Your physician recommends that you continue on your current medications as directed. Please refer to the Current Medication list given to you today.  *If you need a refill on your cardiac medications before your next appointment, please call your pharmacy*   Lab Work:  None Ordered  If you have labs (blood work) drawn today and your tests are completely normal, you will receive your results only by: MyChart Message (if you have MyChart) OR A paper copy in the mail If you have any lab test that is abnormal or we need to change your treatment, we will call you to review the results.   Testing/Procedures:  Your physician has requested that you have an echocardiogram. Echocardiography is a painless test that uses sound waves to create images of your heart. It provides your doctor with information about the size and shape of your heart and how well your heart's chambers and valves are working. This procedure takes approximately one hour. There are no restrictions for this procedure. Please do NOT wear cologne, perfume, aftershave, or lotions (deodorant is allowed). Please arrive 15 minutes prior to your appointment time.    Follow-Up: At [facility], you and your health needs are our priority.  As part of our continuing mission to provide you with exceptional heart  care, we have created designated Provider Care Teams.  These Care Teams include your primary Cardiologist (physician) and Advanced Practice Providers (APPs -  Physician Assistants and Nurse Practitioners) who all work together to provide you with the care you need, when you need it.  We recommend signing up for the patient portal called "MyChart".  Sign up information is provided on this After Visit Summary.  MyChart is used to connect with patients for Virtual Visits (Telemedicine).  Patients are able to view lab/test results, encounter notes, upcoming appointments, etc.  Non-urgent messages can be sent to your provider as well.   To learn more about what you can do with MyChart, go to [email or web address].    Your next appointment:   6 month(s)  Provider:   You may see [name], MD or one of the following Advanced Practice Providers on your designated Care Team:   Nicolasa Ducking, NP Eula Listen, PA-C Cadence Fransico Michael, PA-C Charlsie Quest, NP\    Signed, [name], MD  02/03/2023 11:47 AM    [facility]

## 2023-02-09 ENCOUNTER — Ambulatory Visit: Payer: Medicare Other | Attending: Student in an Organized Health Care Education/Training Program

## 2023-02-09 DIAGNOSIS — Z87891 Personal history of nicotine dependence: Secondary | ICD-10-CM | POA: Diagnosis not present

## 2023-02-09 DIAGNOSIS — R0609 Other forms of dyspnea: Secondary | ICD-10-CM | POA: Diagnosis not present

## 2023-02-09 DIAGNOSIS — R0602 Shortness of breath: Secondary | ICD-10-CM | POA: Insufficient documentation

## 2023-02-09 LAB — PULMONARY FUNCTION TEST ARMC ONLY
DL/VA % pred: 72 %
DL/VA: 2.89 ml/min/mmHg/L
DLCO unc % pred: 50 %
DLCO unc: 10.35 ml/min/mmHg
FEF 25-75 Post: 1.66 L/s
FEF 25-75 Pre: 0.62 L/s
FEF2575-%Change-Post: 170 %
FEF2575-%Pred-Post: 107 %
FEF2575-%Pred-Pre: 39 %
FEV1-%Change-Post: 44 %
FEV1-%Pred-Post: 64 %
FEV1-%Pred-Pre: 44 %
FEV1-Post: 1.41 L
FEV1-Pre: 0.98 L
FEV1FVC-%Change-Post: 18 %
FEV1FVC-%Pred-Pre: 83 %
FEV6-%Change-Post: 21 %
FEV6-%Pred-Post: 69 %
FEV6-%Pred-Pre: 57 %
FEV6-Post: 1.92 L
FEV6-Pre: 1.59 L
FEV6FVC-%Change-Post: 0 %
FEV6FVC-%Pred-Post: 104 %
FEV6FVC-%Pred-Pre: 105 %
FVC-%Change-Post: 21 %
FVC-%Pred-Post: 66 %
FVC-%Pred-Pre: 54 %
FVC-Post: 1.94 L
FVC-Pre: 1.59 L
Post FEV1/FVC ratio: 73 %
Post FEV6/FVC ratio: 99 %
Pre FEV1/FVC ratio: 62 %
Pre FEV6/FVC Ratio: 100 %
RV % pred: 120 %
RV: 3.03 L
TLC % pred: 90 %
TLC: 4.93 L

## 2023-02-09 MED ORDER — ALBUTEROL SULFATE (2.5 MG/3ML) 0.083% IN NEBU
2.5000 mg | INHALATION_SOLUTION | Freq: Once | RESPIRATORY_TRACT | Status: AC
  Administered 2023-02-09: 2.5 mg via RESPIRATORY_TRACT
  Filled 2023-02-09: qty 3

## 2023-02-13 ENCOUNTER — Other Ambulatory Visit: Payer: Self-pay | Admitting: Internal Medicine

## 2023-02-13 NOTE — Telephone Encounter (Signed)
Component Ref Range & Units 12 d ago (02/01/23) 1 mo ago (01/13/23) 2 mo ago (11/16/22) 4 mo ago (10/14/22) 6 mo ago (08/10/22) 8 mo ago (06/08/22) 8 mo ago (06/03/22)  Potassium 3.5 - 5.1 mmol/L 3.9 3.7 3.9 3.9 3.6 4.4 4.2 R

## 2023-02-15 ENCOUNTER — Ambulatory Visit: Payer: Medicare Other | Attending: Cardiology

## 2023-02-15 DIAGNOSIS — R0602 Shortness of breath: Secondary | ICD-10-CM | POA: Diagnosis not present

## 2023-02-15 LAB — ECHOCARDIOGRAM COMPLETE
AV Mean grad: 6 mm[Hg]
AV Peak grad: 11.2 mm[Hg]
Ao pk vel: 1.67 m/s
Area-P 1/2: 4.39 cm2
S' Lateral: 3.2 cm

## 2023-02-20 ENCOUNTER — Telehealth: Payer: Self-pay | Admitting: Cardiology

## 2023-02-20 ENCOUNTER — Telehealth: Payer: Self-pay | Admitting: Student in an Organized Health Care Education/Training Program

## 2023-02-20 MED ORDER — BREZTRI AEROSPHERE 160-9-4.8 MCG/ACT IN AERO: 2.0000 | INHALATION_SPRAY | Freq: Two times a day (BID) | RESPIRATORY_TRACT | 11 refills | Status: DC

## 2023-02-20 NOTE — Telephone Encounter (Signed)
New Message:    Patient wants to know if she needs to have an appointment with Dr Azucena Cecil? She said her appointment on 10-31- 25 was cancelled and she did not know why it was cancelled.

## 2023-02-20 NOTE — Telephone Encounter (Signed)
The patient called inquiring about the rescheduling of her appointment on 02/23/23. The nurse informed the patient that, based on her last office visit, the doctor recommended a follow-up in six months.  The patient also inquired about her ECHO results. She was informed of the results and verbalized her understanding   [name], MD 02/16/2023  9:09 AM EDT     Echo with normal systolic function, no significant structural abnormalities to suggest etiology of shortness of breath.

## 2023-02-20 NOTE — Telephone Encounter (Signed)
I have sent in the refill and notified the patient.  Nothing further needed. 

## 2023-02-20 NOTE — Telephone Encounter (Signed)
Patient states needs new RX for [employer]. Out of medication. Pharmacy is [facility] [place]. Patient phone number is [phone].

## 2023-02-23 ENCOUNTER — Ambulatory Visit: Payer: Medicare Other | Admitting: Cardiology

## 2023-02-24 DIAGNOSIS — H57813 Brow ptosis, bilateral: Secondary | ICD-10-CM | POA: Diagnosis not present

## 2023-02-24 DIAGNOSIS — Z87891 Personal history of nicotine dependence: Secondary | ICD-10-CM | POA: Diagnosis not present

## 2023-02-24 DIAGNOSIS — Z79899 Other long term (current) drug therapy: Secondary | ICD-10-CM | POA: Diagnosis not present

## 2023-02-24 DIAGNOSIS — H02834 Dermatochalasis of left upper eyelid: Secondary | ICD-10-CM | POA: Diagnosis not present

## 2023-02-24 DIAGNOSIS — I10 Essential (primary) hypertension: Secondary | ICD-10-CM | POA: Diagnosis not present

## 2023-02-24 DIAGNOSIS — Z882 Allergy status to sulfonamides status: Secondary | ICD-10-CM | POA: Diagnosis not present

## 2023-02-24 DIAGNOSIS — I340 Nonrheumatic mitral (valve) insufficiency: Secondary | ICD-10-CM | POA: Diagnosis not present

## 2023-02-24 DIAGNOSIS — H02831 Dermatochalasis of right upper eyelid: Secondary | ICD-10-CM | POA: Diagnosis not present

## 2023-02-24 DIAGNOSIS — Z853 Personal history of malignant neoplasm of breast: Secondary | ICD-10-CM | POA: Diagnosis not present

## 2023-02-24 DIAGNOSIS — Z85038 Personal history of other malignant neoplasm of large intestine: Secondary | ICD-10-CM | POA: Diagnosis not present

## 2023-02-24 DIAGNOSIS — M1990 Unspecified osteoarthritis, unspecified site: Secondary | ICD-10-CM | POA: Diagnosis not present

## 2023-03-02 ENCOUNTER — Ambulatory Visit: Payer: Medicare Other | Admitting: Student in an Organized Health Care Education/Training Program

## 2023-03-09 ENCOUNTER — Ambulatory Visit: Payer: Medicare Other | Admitting: Radiation Oncology

## 2023-03-13 ENCOUNTER — Other Ambulatory Visit: Payer: Self-pay

## 2023-03-13 ENCOUNTER — Ambulatory Visit: Payer: Medicare Other | Admitting: Student in an Organized Health Care Education/Training Program

## 2023-03-13 ENCOUNTER — Encounter: Payer: Self-pay | Admitting: Student in an Organized Health Care Education/Training Program

## 2023-03-13 VITALS — BP 138/72 | HR 84 | Temp 98.2°F | Ht 66.0 in | Wt 169.6 lb

## 2023-03-13 DIAGNOSIS — J42 Unspecified chronic bronchitis: Secondary | ICD-10-CM | POA: Diagnosis not present

## 2023-03-13 DIAGNOSIS — J4531 Mild persistent asthma with (acute) exacerbation: Secondary | ICD-10-CM | POA: Diagnosis not present

## 2023-03-13 DIAGNOSIS — R0602 Shortness of breath: Secondary | ICD-10-CM

## 2023-03-13 MED ORDER — AEROCHAMBER MV MISC: 0 refills | Status: AC

## 2023-03-13 NOTE — Progress Notes (Unsigned)
Synopsis: Referred in *** by [name], MD  Assessment & Plan:   1. Shortness of breath *** - Spacer/Aero-Holding Chambers (AEROCHAMBER MV) inhaler; Use as instructed  Dispense: 1 each; Refill: 0  2. Mild persistent asthmatic bronchitis with acute exacerbation ***  Feels much better with breztri. Will continue, with spacer  Return in about 6 months (around 09/10/2023).  I spent *** minutes caring for this patient today, including {EM billing:[id number]}  Raechel Chute, MD [facility] 03/13/2023 3:32 PM    End of visit medications:  Meds ordered this encounter  Medications   Spacer/Aero-Holding Chambers (AEROCHAMBER MV) inhaler    Sig: Use as instructed    Dispense:  1 each    Refill:  0     Current Outpatient Medications:    albuterol (VENTOLIN HFA) 108 (90 Base) MCG/ACT inhaler, Inhale 2 puffs into the lungs every 6 (six) hours as needed for wheezing or shortness of breath., Disp: 8 g, Rfl: 2   anastrozole (ARIMIDEX) 1 MG tablet, TAKE 1 TABLET BY MOUTH DAILY, Disp: 90 tablet, Rfl: 0   atorvastatin (LIPITOR) 10 MG tablet, TAKE 1 TABLET BY MOUTH DAILY, Disp: 90 tablet, Rfl: 1   Budeson-Glycopyrrol-Formoterol (BREZTRI AEROSPHERE) 160-9-4.8 MCG/ACT AERO, Inhale 2 puffs into the lungs in the morning and at bedtime., Disp: 1 each, Rfl: 11   Calcium Carbonate-Vit D-Min (CALCIUM 1200 PO), Take 1 tablet by mouth daily., Disp: , Rfl:    furosemide (LASIX) 20 MG tablet, 1 pill  p.o. every other day, Disp: 30 tablet, Rfl: 0   halobetasol (ULTRAVATE) 0.05 % cream, Apply topically to aa's of rash at body twice daily on weekends only as needed. Avoid applying to face, groin, and axilla. Use as directed., Disp: 50 g, Rfl: 1   loratadine (CLARITIN) 10 MG tablet, Take 10 mg by mouth daily., Disp: , Rfl:    Multiple Vitamins-Minerals (PRESERVISION AREDS 2 PO), , Disp: , Rfl:    niacinamide 500 MG tablet, Take 500 mg by mouth 2 (two) times daily., Disp: , Rfl:     Omega-3 Fatty Acids (FISH OIL PO), Take 1 capsule by mouth daily., Disp: , Rfl:    potassium chloride SA (KLOR-CON M20) 20 MEQ tablet, 1 pill  p.o. every other day, Disp: 30 tablet, Rfl: 0   Spacer/Aero-Holding Chambers (AEROCHAMBER MV) inhaler, Use as instructed, Disp: 1 each, Rfl: 0   tacrolimus (PROTOPIC) 0.1 % ointment, Apply topically to affected areas of rash twice daily on week days only prn, Disp: 60 g, Rfl: 1   VITAMIN D, CHOLECALCIFEROL, PO, Take 2,000 Units by mouth daily., Disp: , Rfl:    abemaciclib (VERZENIO) 100 MG tablet, Take 1 tablet (100 mg total) by mouth 2 (two) times daily. (Patient not taking: Reported on 03/13/2023), Disp: 56 tablet, Rfl: 0   Subjective:   PATIENT ID: Sabrina Wilson GENDER: female DOB: [birth year], MRN: [id number]  Chief Complaint  Patient presents with   Follow-up    Shortness of breath on exertion. No cough or wheezing.     HPI ***  Ancillary information including prior medications, full medical/surgical/family/social histories, and PFTs (when available) are listed below and have been reviewed.   ROS   Objective:   Vitals:   03/13/23 1518  BP: 138/72  Pulse: 84  Temp: 98.2 F (36.8 C)  TempSrc: Temporal  SpO2: 98%  Weight: 169 lb 9.6 oz (76.9 kg)  Height: 5\' 6"  (1.676 m)   98% on *** LPM *** RA BMI  Readings from Last 3 Encounters:  03/13/23 27.37 kg/m  02/03/23 27.50 kg/m  02/01/23 27.47 kg/m   Wt Readings from Last 3 Encounters:  03/13/23 169 lb 9.6 oz (76.9 kg)  02/03/23 170 lb 6.4 oz (77.3 kg)  02/01/23 170 lb 3.2 oz (77.2 kg)    Physical Exam    Ancillary Information    Past Medical History:  Diagnosis Date   Actinic keratosis 02/12/2020   R lat calf (hypertrophic)   Arthritis    Cancer of sigmoid (HCC) 06/17/2016   Colon cancer (HCC) 2009   T3,N0; s/p resection  Dr. Earnestine Leys and chemotherapy by Dr. Neale Burly   Diastolic dysfunction    a. 08/2020 Echo: EF 60-65%, no rwma, Gr1 DD, nl RV size/fxn. Mild MR.    Hernia of flank    History of actinic keratoses 08/11/2020   Bx proven at left lateral ankle proximal, LN2 10/08/20   History of stress test    a. 09/2020 MV: EF >65%. No ischemia/infarct. Mild Ao Ca2+ and minimal Cor Ca2+.   Hypertension    Neuropathy    Polyp at cervical os    s/p resection Dr. Hyacinth Meeker   Skin cancer 01/22/2020   surface of an atypical verrucous squamous proliferation    Squamous cell carcinoma of skin 07/16/2020   Left lateral ankle anterior, treated with [facility] 08-11-2020     Family History  Problem Relation Age of Onset   Stroke Mother    Diabetes Mother    Colon cancer Father        dx 3s   Stroke Sister    Colon cancer Brother        dx 36s-70s   Brain cancer Brother        dx 60s-70s     Past Surgical History:  Procedure Laterality Date   BREAST BIOPSY Right 05/2014   Dr. Rutherford Nail office-benign   BREAST LUMPECTOMY WITH SENTINEL LYMPH NODE BIOPSY Right 01/21/2022   Procedure: BREAST LUMPECTOMY WITH SENTINEL LYMPH NODE BX;  Surgeon: [name], MD;  Location: [facility] ORS;  Service: General;  Laterality: Right;   BREAST SURGERY Right February 2016   Vacuum assisted biopsy for recurrent cyst, fibrocystic changes without evidence of malignancy.   CATARACT EXTRACTION W/PHACO Left 01/13/2015   Procedure: CATARACT EXTRACTION PHACO AND INTRAOCULAR LENS PLACEMENT (IOC);  Surgeon: Galen Manila, MD;  Location: [facility] ORS;  Service: Ophthalmology;  Laterality: Left;  [place]  1:02.9 AP   19.2 CDE  12.06 casette lot #  [id number]   CATARACT EXTRACTION W/PHACO Right 02/03/2015   Procedure: CATARACT EXTRACTION PHACO AND INTRAOCULAR LENS PLACEMENT (IOC);  Surgeon: Galen Manila, MD;  Location: [facility] ORS;  Service: Ophthalmology;  Laterality: Right;  [id number] [id number] [id number]   COLECTOMY     COLONOSCOPY  2015   Dr Bluford Kaufmann   COLONOSCOPY WITH PROPOFOL N/A 09/13/2017   Procedure: COLONOSCOPY WITH PROPOFOL;  Surgeon: [name], MD;  Location[facility] ENDOSCOPY;  Service:  Endoscopy;  Laterality: N/A;   COLONOSCOPY WITH PROPOFOL N/A 11/08/2017   Procedure: COLONOSCOPY WITH PROPOFOL;  Surgeon: [name], MD;  Location: [facility] ENDOSCOPY;  Service: Endoscopy;  Laterality: N/A;   colostomy reversal     DILATION AND CURETTAGE OF UTERUS  2014   Dr. Hyacinth Meeker, benign per pt   EYE SURGERY     HERNIA REPAIR  07/13/12   ventral    SKIN CANCER EXCISION     TONSILLECTOMY      Social History   Socioeconomic History  Marital status: Married    Spouse name: Not on file   Number of children: Not on file   Years of education: Not on file   Highest education level: Not on file  Occupational History   Not on file  Tobacco Use   Smoking status: Former    Current packs/day: 0.00    Types: Cigarettes    Quit date: 10/12/2007    Years since quitting: 15.4   Smokeless tobacco: Never  Vaping Use   Vaping status: Never Used  Substance and Sexual Activity   Alcohol use: Yes    Alcohol/week: 1.0 standard drink of alcohol    Types: 1 Standard drinks or equivalent per week    Comment: with dinner GLASS OF WINE EACH DAY   Drug use: No   Sexual activity: Not on file  Other Topics Concern   Not on file  Social History Narrative   Lives in [place] with husband. 2 children, son lives nearby.Diet - regularExercise - none. 30 mins/in [place]. Quit smoking in 2009. Wine before dinner. Pianist in church; real estate; Diplomatic Services operational officer.    Social Determinants of Health   Financial Resource Strain: Low Risk  (11/21/2022)   Overall Financial Resource Strain (CARDIA)    Difficulty of Paying Living Expenses: Not hard at all  Food Insecurity: No Food Insecurity (11/21/2022)   Hunger Vital Sign    Worried About Running Out of Food in the Last Year: Never true    Ran Out of Food in the Last Year: Never true  Transportation Needs: No Transportation Needs (11/21/2022)   PRAPARE - Administrator, Civil Service (Medical): No    Lack of Transportation (Non-Medical): No   Physical Activity: Inactive (11/21/2022)   Exercise Vital Sign    Days of Exercise per Week: 0 days    Minutes of Exercise per Session: 0 min  Stress: No Stress Concern Present (11/21/2022)   [employer] of Occupational Health - Occupational Stress Questionnaire    Feeling of Stress : Not at all  Social Connections: Socially Integrated (11/21/2022)   Social Connection and Isolation Panel [NHANES]    Frequency of Communication with Friends and Family: More than three times a week    Frequency of Social Gatherings with Friends and Family: More than three times a week    Attends Religious Services: More than 4 times per year    Active Member of [employer] or Organizations: Yes    Attends Banker Meetings: More than 4 times per year    Marital Status: Married  Catering manager Violence: Not At Risk (11/21/2022)   Humiliation, Afraid, Rape, and Kick questionnaire    Fear of Current or Ex-Partner: No    Emotionally Abused: No    Physically Abused: No    Sexually Abused: No     Allergies  Allergen Reactions   Sulfa Antibiotics Hives    As a child     CBC    Component Value Date/Time   WBC 6.2 02/01/2023 0920   WBC 5.8 07/14/2022 1505   RBC 3.03 (L) 02/01/2023 0920   HGB 11.4 (L) 02/01/2023 0920   HGB 12.4 05/15/2013 1445   HCT 33.3 (L) 02/01/2023 0920   HCT 38.2 05/15/2013 1445   PLT 246 02/01/2023 0920   PLT 255 05/15/2013 1445   MCV 109.9 (H) 02/01/2023 0920   MCV 96 05/15/2013 1445   MCH 37.6 (H) 02/01/2023 0920   MCHC 34.2 02/01/2023 0920   RDW 12.9 02/01/2023  0920   RDW 13.3 05/15/2013 1445   LYMPHSABS 1.0 01/13/2023 1431   LYMPHSABS 2.0 05/15/2013 1445   MONOABS 0.5 01/13/2023 1431   MONOABS 0.6 05/15/2013 1445   EOSABS 0.2 01/13/2023 1431   EOSABS 0.4 05/15/2013 1445   BASOSABS 0.1 01/13/2023 1431   BASOSABS 0.1 05/15/2013 1445    Pulmonary Functions Testing Results:    Latest Ref Rng & Units 02/09/2023    4:36 PM  PFT Results  FVC-Pre L 1.59    FVC-Predicted Pre % 54   FVC-Post L 1.94   FVC-Predicted Post % 66   Pre FEV1/FVC % % 62   Post FEV1/FCV % % 73   FEV1-Pre L 0.98   FEV1-Predicted Pre % 44   FEV1-Post L 1.41   DLCO uncorrected ml/min/mmHg 10.35   DLCO UNC% % 50   DLVA Predicted % 72   TLC L 4.93   TLC % Predicted % 90   RV % Predicted % 120     Outpatient Medications Prior to Visit  Medication Sig Dispense Refill   albuterol (VENTOLIN HFA) 108 (90 Base) MCG/ACT inhaler Inhale 2 puffs into the lungs every 6 (six) hours as needed for wheezing or shortness of breath. 8 g 2   anastrozole (ARIMIDEX) 1 MG tablet TAKE 1 TABLET BY MOUTH DAILY 90 tablet 0   atorvastatin (LIPITOR) 10 MG tablet TAKE 1 TABLET BY MOUTH DAILY 90 tablet 1   Budeson-Glycopyrrol-Formoterol (BREZTRI AEROSPHERE) 160-9-4.8 MCG/ACT AERO Inhale 2 puffs into the lungs in the morning and at bedtime. 1 each 11   Calcium Carbonate-Vit D-Min (CALCIUM 1200 PO) Take 1 tablet by mouth daily.     furosemide (LASIX) 20 MG tablet 1 pill  p.o. every other day 30 tablet 0   halobetasol (ULTRAVATE) 0.05 % cream Apply topically to aa's of rash at body twice daily on weekends only as needed. Avoid applying to face, groin, and axilla. Use as directed. 50 g 1   loratadine (CLARITIN) 10 MG tablet Take 10 mg by mouth daily.     Multiple Vitamins-Minerals (PRESERVISION AREDS 2 PO)      niacinamide 500 MG tablet Take 500 mg by mouth 2 (two) times daily.     Omega-3 Fatty Acids (FISH OIL PO) Take 1 capsule by mouth daily.     potassium chloride SA (KLOR-CON M20) 20 MEQ tablet 1 pill  p.o. every other day 30 tablet 0   tacrolimus (PROTOPIC) 0.1 % ointment Apply topically to affected areas of rash twice daily on week days only prn 60 g 1   VITAMIN D, CHOLECALCIFEROL, PO Take 2,000 Units by mouth daily.     abemaciclib (VERZENIO) 100 MG tablet Take 1 tablet (100 mg total) by mouth 2 (two) times daily. (Patient not taking: Reported on 03/13/2023) 56 tablet 0   No  facility-administered medications prior to visit.

## 2023-03-14 ENCOUNTER — Other Ambulatory Visit: Payer: Self-pay

## 2023-03-14 ENCOUNTER — Other Ambulatory Visit: Payer: Self-pay | Admitting: *Deleted

## 2023-03-14 DIAGNOSIS — J9601 Acute respiratory failure with hypoxia: Secondary | ICD-10-CM

## 2023-03-15 ENCOUNTER — Inpatient Hospital Stay (HOSPITAL_BASED_OUTPATIENT_CLINIC_OR_DEPARTMENT_OTHER): Payer: Medicare Other | Admitting: Internal Medicine

## 2023-03-15 ENCOUNTER — Inpatient Hospital Stay: Payer: Medicare Other | Attending: Internal Medicine

## 2023-03-15 ENCOUNTER — Other Ambulatory Visit: Payer: Self-pay

## 2023-03-15 ENCOUNTER — Encounter: Payer: Self-pay | Admitting: Internal Medicine

## 2023-03-15 VITALS — BP 149/92 | HR 50 | Temp 98.7°F | Ht 66.0 in | Wt 169.0 lb

## 2023-03-15 DIAGNOSIS — Z86711 Personal history of pulmonary embolism: Secondary | ICD-10-CM | POA: Diagnosis not present

## 2023-03-15 DIAGNOSIS — Z170 Estrogen receptor positive status [ER+]: Secondary | ICD-10-CM

## 2023-03-15 DIAGNOSIS — Z79811 Long term (current) use of aromatase inhibitors: Secondary | ICD-10-CM | POA: Diagnosis not present

## 2023-03-15 DIAGNOSIS — I10 Essential (primary) hypertension: Secondary | ICD-10-CM | POA: Insufficient documentation

## 2023-03-15 DIAGNOSIS — C50811 Malignant neoplasm of overlapping sites of right female breast: Secondary | ICD-10-CM | POA: Diagnosis not present

## 2023-03-15 DIAGNOSIS — M8580 Other specified disorders of bone density and structure, unspecified site: Secondary | ICD-10-CM | POA: Insufficient documentation

## 2023-03-15 DIAGNOSIS — Z79899 Other long term (current) drug therapy: Secondary | ICD-10-CM | POA: Insufficient documentation

## 2023-03-15 DIAGNOSIS — J9601 Acute respiratory failure with hypoxia: Secondary | ICD-10-CM

## 2023-03-15 DIAGNOSIS — Z7901 Long term (current) use of anticoagulants: Secondary | ICD-10-CM | POA: Diagnosis not present

## 2023-03-15 DIAGNOSIS — R0600 Dyspnea, unspecified: Secondary | ICD-10-CM | POA: Insufficient documentation

## 2023-03-15 LAB — CMP (CANCER CENTER ONLY)
ALT: 14 U/L (ref 0–44)
AST: 20 U/L (ref 15–41)
Albumin: 4.2 g/dL (ref 3.5–5.0)
Alkaline Phosphatase: 78 U/L (ref 38–126)
Anion gap: 13 (ref 5–15)
BUN: 22 mg/dL (ref 8–23)
CO2: 25 mmol/L (ref 22–32)
Calcium: 10.3 mg/dL (ref 8.9–10.3)
Chloride: 100 mmol/L (ref 98–111)
Creatinine: 0.95 mg/dL (ref 0.44–1.00)
GFR, Estimated: >60 mL/min (ref >60)
Glucose, Bld: 102 mg/dL — ABNORMAL HIGH (ref 70–99)
Potassium: 3.7 mmol/L (ref 3.5–5.1)
Sodium: 138 mmol/L (ref 135–145)
Total Bilirubin: 0.3 mg/dL (ref <1.2)
Total Protein: 7.6 g/dL (ref 6.5–8.1)

## 2023-03-15 LAB — CBC WITH DIFFERENTIAL (CANCER CENTER ONLY)
Abs Immature Granulocytes: 0.02 10*3/uL (ref 0.00–0.07)
Basophils Absolute: 0.1 10*3/uL (ref 0.0–0.1)
Basophils Relative: 1 %
Eosinophils Absolute: 0.6 10*3/uL — ABNORMAL HIGH (ref 0.0–0.5)
Eosinophils Relative: 10 %
HCT: 36.6 % (ref 36.0–46.0)
Hemoglobin: 12 g/dL (ref 12.0–15.0)
Immature Granulocytes: 0 %
Lymphocytes Relative: 17 %
Lymphs Abs: 1.1 10*3/uL (ref 0.7–4.0)
MCH: 35.4 pg — ABNORMAL HIGH (ref 26.0–34.0)
MCHC: 32.8 g/dL (ref 30.0–36.0)
MCV: 108 fL — ABNORMAL HIGH (ref 80.0–100.0)
Monocytes Absolute: 0.9 10*3/uL (ref 0.1–1.0)
Monocytes Relative: 14 %
Neutro Abs: 3.7 10*3/uL (ref 1.7–7.7)
Neutrophils Relative %: 58 %
Platelet Count: 217 10*3/uL (ref 150–400)
RBC: 3.39 MIL/uL — ABNORMAL LOW (ref 3.87–5.11)
RDW: 12.1 % (ref 11.5–15.5)
WBC Count: 6.4 10*3/uL (ref 4.0–10.5)
nRBC: 0 % (ref 0.0–0.2)

## 2023-03-15 LAB — BRAIN NATRIURETIC PEPTIDE: B Natriuretic Peptide: 120 pg/mL — ABNORMAL HIGH (ref 0.0–100.0)

## 2023-03-15 NOTE — Progress Notes (Signed)
[facility] CONSULT NOTE  Patient Care Team: [name], MD as PCP - General (Family Medicine) [name], MD as PCP - Cardiology (Cardiology) [name], [name], MD as Consulting Physician (General Surgery) [name], MD (Internal Medicine) [name], RN as Oncology Nurse Navigator [name], MD as Consulting Physician (Radiation Oncology) [name], MD as Consulting Physician (Internal Medicine) Sung Amabile, DO as Consulting Physician (General Surgery)  CHIEF COMPLAINTS/PURPOSE OF CONSULTATION: Breast cancer  #  Oncology History Overview Note  # 2009- Sigmoid colon cancer stage II (T3 N0 4 lymph nodes sampled M0 stage II high risk there was obstruction and perforation abscess; Dr.Hern), workup included a low CEA preoperatively and a chest x-ray and CT scan abdomen pelvis negative for metastatic disease, S/P colostomy; s/p FOLFOX x 6 months. [Dr.Gittin ]  # LATER REVERSED , K RAS  wild type B RAF not evaluated   Also initial iron deficiency with anemia thrombocytosis, high platelets considered reactive, workup includes a normal PCR for CML and a negative [id number] mutation  DIAGNOSIS:  A. BREAST, RIGHT; LUMPECTOMY:  - INVASIVE MAMMARY CARCINOMA.  - DUCTAL CARCINOMA IN SITU (DCIS).  - SEE CANCER SUMMARY BELOW.  - BIOPSY SITE CHANGE WITH CLIP (2).  - FIBROUS TISSUE WITH CYST FORMATION AND FLORID DUCTAL HYPERPLASIA.  - SKIN AND NIPPLE NEGATIVE FOR MALIGNANCY.  - VASCULAR CALCIFICATION.   B. SENTINEL LYMPH NODES 1 AND 2, RIGHT AXILLA; EXCISION:  - METASTATIC CARCINOMA INVOLVES ONE OF TWO LYMPH NODES (1/2).   C. NON-SENTINEL LYMPH NODES, RIGHT AXILLA; EXCISION:  - ONE LYMPH NODE NEGATIVE FOR MALIGNANCY (0/1).   CANCER CASE SUMMARY: INVASIVE CARCINOMA OF THE BREAST  Standard(s): AJCC-UICC 8   SPECIMEN  Procedure: Lumpectomy  Specimen Laterality: Right   TUMOR  Histologic Type: Invasive carcinoma of no special type  (ductal)  Histologic Grade (Nottingham Histologic Score)       Glandular (Acinar)/Tubular Differentiation: 3       Nuclear Pleomorphism: 3       Mitotic Rate: 3       Overall Grade: 3  Tumor Size: 24 mm  Tumor Focality: Single focus of invasive carcinoma  Ductal Carcinoma In Situ (DCIS): Present, nuclear grade 2-3  Tumor Extent: Not applicable  Lymphatic and/or Vascular Invasion: Present  Treatment Effect in the Breast: No known presurgical therapy   MARGINS  Margin Status for Invasive Carcinoma: All margins negative for invasive  carcinoma       Distance from closest margin: 10 mm       Specify closest margin: Superior   Margin Status for DCIS: All margins negative for DCIS       Distance from DCIS to closest margin: 10 mm       Specify closest margin: Superior   REGIONAL LYMPH NODES  Regional Lymph Node Status: Tumor present in regional lymph node(s)       Number of Lymph Nodes with Macrometastases (greater than 2 mm): 1       Number of Lymph Nodes with Micrometastases (greater than 0.2 mm to  2 mm and/or greater than 200 cells): 0       Number of Lymph Nodes with Isolated Tumor Cells (0.2 mm or less OR  200 cells or less): 0       Size of Largest Metastatic Deposit: 3 mm      Extranodal Extension: Not identified       Total Number of Lymph Nodes Examined (sentinel and non-sentinel): 3  Number of Sentinel Nodes Examined: 2   DISTANT METASTASIS  Distant Site(s) Involved, if applicable: Not applicable   PATHOLOGIC STAGE CLASSIFICATION (pTNM, AJCC 8th Edition):  Modified Classification: Not applicable  pT Category: pT2  T Suffix: Not applicable  pN Category: pN1a  N Suffix: (sn)  pM Category: Not applicable   SPECIAL STUDIES  Breast Biomarker Testing Performed on Previous Outside Biopsy:  23-250-T11  Per outside report:  Estrogen Receptor (ER) Status: POSITIVE          Percentage of cells with nuclear positivity: Greater than 90%          Average intensity of  staining: Strong   Progesterone Receptor (PgR) Status: POSITIVE          Percentage of cells with nuclear positivity: 60%          Average intensity of staining: Strong   HER2 (by immunohistochemistry): EQUIVOCAL (Score 2+)  HER2 FISH: NEGATIVE   Ki-67: Not performed   # Cytoxan Taxotere x 4 cycles; finished JAN, 2024. [Neutropenic fevers s/p cycle #4;  -FEB 2nd, 2024-right lung PE; left lower LE  DVT- on eliquis.   # FEB 19th-start RT  IMPRESSION: 1. Complex cystic and solid-appearing mass within the subareolar RIGHT breast, overall measuring 3.1 cm greatest dimension, the solid enhancing component at the anterior-lateral aspect of the mass measuring 2 x 1 cm, abutting the overlying skin but without evidence of involvement/enhancement of the skin. Susceptibility artifact within the mass may represent a biopsy clip (if biopsy has been performed at an outside site since the diagnostic mammogram/ultrasound of 12/28/2021) or this artifact could represent calcifications within the mass. This mass corresponds to the recent ultrasound finding of a complex irregular mass at the 11 o'clock axis of the retroareolar RIGHT breast. 2. No evidence of multifocal or multicentric disease within the RIGHT breast. 3. No evidence of contralateral disease in the LEFT breast.   RECOMMENDATION: If has not already been performed, recommend ultrasound-guided biopsy of the suspicious mass in the subareolar RIGHT breast, with preferential targeting of the anterior-lateral component of the mass which is shown to be the enhancing component on today's MRI.   BI-RADS CATEGORY  4: Suspicious.     Cancer of sigmoid (HCC) (Resolved)  Carcinoma of overlapping sites of right breast in female, estrogen receptor positive (HCC)  02/14/2022 Initial Diagnosis   Carcinoma of overlapping sites of right breast in female, estrogen receptor positive (HCC)   02/14/2022 Cancer Staging   Staging form: Breast, AJCC  8th Edition - Pathologic: Stage IIA (pT2, pN1, cM0, G3, ER+, PR+, HER2-) - Signed by [name], MD on 02/14/2022 Histologic grading system: 3 grade system   02/23/2022 -  Chemotherapy   Patient is on Treatment Plan : BREAST TC q21d      HISTORY OF PRESENTING ILLNESS:   Alone.  Ambulating independently.   Sabrina Wilson 82 y.o.  female patient with ER/PR positive Her 2 Negative  breast cancer stage II T2 N1-currently  adjuvant anastrozole; and verzenio [on HOLD since SEP 2-24] is here for a follow up.   Currently off verzinio for last 2 months.  Noted to have improvement of the fatigue. Dyspnea also improved.   Her leg swelling is improved overall. .  Denies any blood in the black or stools.  Review of Systems  Constitutional:  Positive for malaise/fatigue and weight loss. Negative for chills, diaphoresis and fever.  HENT:  Negative for nosebleeds and sore throat.   Eyes:  Negative  for double vision.  Respiratory:  Positive for cough and shortness of breath. Negative for hemoptysis, sputum production and wheezing.   Cardiovascular:  Negative for chest pain, palpitations, orthopnea and leg swelling.  Gastrointestinal:  Positive for abdominal pain and nausea. Negative for blood in stool, constipation, diarrhea, heartburn, melena and vomiting.  Genitourinary:  Negative for dysuria, frequency and urgency.  Musculoskeletal:  Negative for back pain and joint pain.  Skin: Negative.  Negative for itching and rash.  Neurological:  Negative for dizziness, tingling, focal weakness, weakness and headaches.  Endo/Heme/Allergies:  Does not bruise/bleed easily.  Psychiatric/Behavioral:  Negative for depression. The patient is not nervous/anxious and does not have insomnia.      MEDICAL HISTORY:  Past Medical History:  Diagnosis Date   Actinic keratosis 02/12/2020   R lat calf (hypertrophic)   Arthritis    Cancer of sigmoid (HCC) 06/17/2016   Colon cancer (HCC) 2009   T3,N0; s/p  resection  Dr. Earnestine Leys and chemotherapy by Dr. Neale Burly   Diastolic dysfunction    a. 08/2020 Echo: EF 60-65%, no rwma, Gr1 DD, nl RV size/fxn. Mild MR.   Hernia of flank    History of actinic keratoses 08/11/2020   Bx proven at left lateral ankle proximal, LN2 10/08/20   History of stress test    a. 09/2020 MV: EF >65%. No ischemia/infarct. Mild Ao Ca2+ and minimal Cor Ca2+.   Hypertension    Neuropathy    Polyp at cervical os    s/p resection Dr. Hyacinth Meeker   Skin cancer 01/22/2020   surface of an atypical verrucous squamous proliferation    Squamous cell carcinoma of skin 07/16/2020   Left lateral ankle anterior, treated with [facility] 08-11-2020    SURGICAL HISTORY: Past Surgical History:  Procedure Laterality Date   BREAST BIOPSY Right 05/2014   Dr. Rutherford Nail office-benign   BREAST LUMPECTOMY WITH SENTINEL LYMPH NODE BIOPSY Right 01/21/2022   Procedure: BREAST LUMPECTOMY WITH SENTINEL LYMPH NODE BX;  Surgeon: [name], MD;  Location: [facility] ORS;  Service: General;  Laterality: Right;   BREAST SURGERY Right February 2016   Vacuum assisted biopsy for recurrent cyst, fibrocystic changes without evidence of malignancy.   CATARACT EXTRACTION W/PHACO Left 01/13/2015   Procedure: CATARACT EXTRACTION PHACO AND INTRAOCULAR LENS PLACEMENT (IOC);  Surgeon: Galen Manila, MD;  Location: [facility] ORS;  Service: Ophthalmology;  Laterality: Left;  [place]  1:02.9 AP   19.2 CDE  12.06 casette lot #  [id number]   CATARACT EXTRACTION W/PHACO Right 02/03/2015   Procedure: CATARACT EXTRACTION PHACO AND INTRAOCULAR LENS PLACEMENT (IOC);  Surgeon: Galen Manila, MD;  Location: [facility] ORS;  Service: Ophthalmology;  Laterality: Right;  us00:53 ap46.5 [id number]   COLECTOMY     COLONOSCOPY  2015   Dr Bluford Kaufmann   COLONOSCOPY WITH PROPOFOL N/A 09/13/2017   Procedure: COLONOSCOPY WITH PROPOFOL;  Surgeon: [name], MD;  Location: [facility] ENDOSCOPY;  Service: Endoscopy;  Laterality: N/A;   COLONOSCOPY WITH PROPOFOL N/A  11/08/2017   Procedure: COLONOSCOPY WITH PROPOFOL;  Surgeon: [name], MD;  Location: [facility] ENDOSCOPY;  Service: Endoscopy;  Laterality: N/A;   colostomy reversal     DILATION AND CURETTAGE OF UTERUS  2014   Dr. Hyacinth Meeker, benign per pt   EYE SURGERY     HERNIA REPAIR  07/13/12   ventral    SKIN CANCER EXCISION     TONSILLECTOMY      SOCIAL HISTORY: Social History   Socioeconomic History   Marital status: Married  Spouse name: Not on file   Number of children: Not on file   Years of education: Not on file   Highest education level: Not on file  Occupational History   Not on file  Tobacco Use   Smoking status: Former    Current packs/day: 0.00    Types: Cigarettes    Quit date: 10/12/2007    Years since quitting: 15.4   Smokeless tobacco: Never  Vaping Use   Vaping status: Never Used  Substance and Sexual Activity   Alcohol use: Yes    Alcohol/week: 1.0 standard drink of alcohol    Types: 1 Standard drinks or equivalent per week    Comment: with dinner GLASS OF WINE EACH DAY   Drug use: No   Sexual activity: Not on file  Other Topics Concern   Not on file  Social History Narrative   Lives in [place] with husband. 2 children, son lives nearby.Diet - regularExercise - none. 30 mins/in [place]. Quit smoking in 2009. Wine before dinner. Pianist in church; real estate; Diplomatic Services operational officer.    Social Determinants of Health   Financial Resource Strain: Low Risk  (11/21/2022)   Overall Financial Resource Strain (CARDIA)    Difficulty of Paying Living Expenses: Not hard at all  Food Insecurity: No Food Insecurity (11/21/2022)   Hunger Vital Sign    Worried About Running Out of Food in the Last Year: Never true    Ran Out of Food in the Last Year: Never true  Transportation Needs: No Transportation Needs (11/21/2022)   PRAPARE - Administrator, Civil Service (Medical): No    Lack of Transportation (Non-Medical): No  Physical Activity: Inactive (11/21/2022)    Exercise Vital Sign    Days of Exercise per Week: 0 days    Minutes of Exercise per Session: 0 min  Stress: No Stress Concern Present (11/21/2022)   [employer] of Occupational Health - Occupational Stress Questionnaire    Feeling of Stress : Not at all  Social Connections: Socially Integrated (11/21/2022)   Social Connection and Isolation Panel [NHANES]    Frequency of Communication with Friends and Family: More than three times a week    Frequency of Social Gatherings with Friends and Family: More than three times a week    Attends Religious Services: More than 4 times per year    Active Member of [employer] or Organizations: Yes    Attends Banker Meetings: More than 4 times per year    Marital Status: Married  Catering manager Violence: Not At Risk (11/21/2022)   Humiliation, Afraid, Rape, and Kick questionnaire    Fear of Current or Ex-Partner: No    Emotionally Abused: No    Physically Abused: No    Sexually Abused: No    FAMILY HISTORY: Family History  Problem Relation Age of Onset   Stroke Mother    Diabetes Mother    Colon cancer Father        dx 11s   Stroke Sister    Colon cancer Brother        dx 27s-70s   Brain cancer Brother        dx 60s-70s    ALLERGIES:  is allergic to sulfa antibiotics.  MEDICATIONS:  Current Outpatient Medications  Medication Sig Dispense Refill   albuterol (VENTOLIN HFA) 108 (90 Base) MCG/ACT inhaler Inhale 2 puffs into the lungs every 6 (six) hours as needed for wheezing or shortness of breath. 8 g 2  anastrozole (ARIMIDEX) 1 MG tablet TAKE 1 TABLET BY MOUTH DAILY 90 tablet 0   atorvastatin (LIPITOR) 10 MG tablet TAKE 1 TABLET BY MOUTH DAILY 90 tablet 1   Budeson-Glycopyrrol-Formoterol (BREZTRI AEROSPHERE) 160-9-4.8 MCG/ACT AERO Inhale 2 puffs into the lungs in the morning and at bedtime. 1 each 11   Calcium Carbonate-Vit D-Min (CALCIUM 1200 PO) Take 1 tablet by mouth daily.     furosemide (LASIX) 20 MG tablet 1 pill   p.o. every other day 30 tablet 0   halobetasol (ULTRAVATE) 0.05 % cream Apply topically to aa's of rash at body twice daily on weekends only as needed. Avoid applying to face, groin, and axilla. Use as directed. 50 g 1   loratadine (CLARITIN) 10 MG tablet Take 10 mg by mouth daily.     Multiple Vitamins-Minerals (PRESERVISION AREDS 2 PO)      niacinamide 500 MG tablet Take 500 mg by mouth 2 (two) times daily.     Omega-3 Fatty Acids (FISH OIL PO) Take 1 capsule by mouth daily.     potassium chloride SA (KLOR-CON M20) 20 MEQ tablet 1 pill  p.o. every other day 30 tablet 0   Spacer/Aero-Holding Chambers (AEROCHAMBER MV) inhaler Use as instructed 1 each 0   tacrolimus (PROTOPIC) 0.1 % ointment Apply topically to affected areas of rash twice daily on week days only prn 60 g 1   VITAMIN D, CHOLECALCIFEROL, PO Take 2,000 Units by mouth daily.     abemaciclib (VERZENIO) 100 MG tablet Take 1 tablet (100 mg total) by mouth 2 (two) times daily. (Patient not taking: Reported on 03/13/2023) 56 tablet 0   No current facility-administered medications for this visit.    PHYSICAL EXAMINATION:   Vitals:   03/15/23 1513  BP: (!) 149/92  Pulse: (!) 50  Temp: 98.7 F (37.1 C)  SpO2: 98%    Filed Weights   03/15/23 1513  Weight: 169 lb (76.7 kg)     Physical Exam Vitals and nursing note reviewed.  HENT:     Head: Normocephalic and atraumatic.     Mouth/Throat:     Pharynx: Oropharynx is clear.  Eyes:     Extraocular Movements: Extraocular movements intact.     Pupils: Pupils are equal, round, and reactive to light.  Cardiovascular:     Rate and Rhythm: Normal rate and regular rhythm.  Pulmonary:     Comments: Decreased breath sounds bilaterally.  Abdominal:     Palpations: Abdomen is soft.  Musculoskeletal:        General: Normal range of motion.     Cervical back: Normal range of motion.  Skin:    General: Skin is warm.  Neurological:     General: No focal deficit present.      Mental Status: She is alert and oriented to person, place, and time.  Psychiatric:        Behavior: Behavior normal.        Judgment: Judgment normal.      LABORATORY DATA:  I have reviewed the data as listed Lab Results  Component Value Date   WBC 6.4 03/15/2023   HGB 12.0 03/15/2023   HCT 36.6 03/15/2023   MCV 108.0 (H) 03/15/2023   PLT 217 03/15/2023   Recent Labs    01/13/23 1432 02/01/23 0920 03/15/23 1505  NA 140 137 138  K 3.7 3.9 3.7  CL 106 100 100  CO2 25 27 25   GLUCOSE 100* 109* 102*  BUN 20 19 22  CREATININE 0.98 1.19* 0.95  CALCIUM 10.0 9.8 10.3  GFRNONAA 59* 46* >60  PROT 6.8 7.2 7.6  ALBUMIN 4.3 4.4 4.2  AST 20 27 20   ALT 15 18 14   ALKPHOS 44 55 78  BILITOT 0.9 0.8 0.3    RADIOGRAPHIC STUDIES: I have personally reviewed the radiological images as listed and agreed with the findings in the report. ECHOCARDIOGRAM COMPLETE  Result Date: 02/15/2023    ECHOCARDIOGRAM REPORT   Patient Name:   Sabrina Wilson Date of Exam: 02/15/2023 Medical Rec #:  [id number]        Height:       66.0 in Accession #:    [id number]       Weight:       170.4 lb Date of Birth:  [birth year]        BSA:          1.868 m Patient Age:    82 years         BP:           140/80 mmHg Patient Gender: F                HR:           81 bpm. Exam Location:  [facility] Procedure: 2D Echo, Color Doppler and Cardiac Doppler Indications:    R06.9 DOE  History:        Patient has prior history of Echocardiogram examinations, most                 recent 09/18/2020. Signs/Symptoms:Dyspnea, Shortness of Breath                 and Edema; Risk Factors:Former Smoker, Hypertension and                 Dyslipidemia.  Sonographer:    Ilda Mori MHA, BS, RDCS Referring Phys: [id number] BRIAN AGBOR-ETANG IMPRESSIONS  1. Left ventricular ejection fraction, by estimation, is 55 to 60%. The left ventricle has normal function. The left ventricle has no regional wall motion abnormalities. Left ventricular diastolic  parameters are consistent with Grade I diastolic dysfunction (impaired relaxation).  2. Right ventricular systolic function is normal. The right ventricular size is normal. There is normal pulmonary artery systolic pressure. The estimated right ventricular systolic pressure is 30.8 mmHg.  3. The mitral valve is normal in structure. Mild mitral valve regurgitation. No evidence of mitral stenosis.  4. The aortic valve has an indeterminant number of cusps. Aortic valve regurgitation is mild. Aortic valve sclerosis is present, with no evidence of aortic valve stenosis.  5. The inferior vena cava is normal in size with greater than 50% respiratory variability, suggesting right atrial pressure of 3 mmHg. FINDINGS  Left Ventricle: Left ventricular ejection fraction, by estimation, is 55 to 60%. The left ventricle has normal function. The left ventricle has no regional wall motion abnormalities. The left ventricular internal cavity size was normal in size. There is  no left ventricular hypertrophy. Left ventricular diastolic parameters are consistent with Grade I diastolic dysfunction (impaired relaxation). Right Ventricle: The right ventricular size is normal. No increase in right ventricular wall thickness. Right ventricular systolic function is normal. There is normal pulmonary artery systolic pressure. The tricuspid regurgitant velocity is 2.54 m/s, and  with an assumed right atrial pressure of 5 mmHg, the estimated right ventricular systolic pressure is 30.8 mmHg. Left Atrium: Left atrial size was normal in size. Right Atrium: Right atrial size was normal in size.  Pericardium: There is no evidence of pericardial effusion. Mitral Valve: The mitral valve is normal in structure. Mild mitral valve regurgitation. No evidence of mitral valve stenosis. Tricuspid Valve: The tricuspid valve is normal in structure. Tricuspid valve regurgitation is mild . No evidence of tricuspid stenosis. Aortic Valve: The aortic valve has an  indeterminant number of cusps. Aortic valve regurgitation is mild. Aortic valve sclerosis is present, with no evidence of aortic valve stenosis. Aortic valve mean gradient measures 6.0 mmHg. Aortic valve peak gradient measures 11.2 mmHg. Pulmonic Valve: The pulmonic valve was normal in structure. Pulmonic valve regurgitation is mild. No evidence of pulmonic stenosis. Aorta: The aortic root is normal in size and structure. Venous: The inferior vena cava is normal in size with greater than 50% respiratory variability, suggesting right atrial pressure of 3 mmHg. IAS/Shunts: No atrial level shunt detected by color flow Doppler.  LEFT VENTRICLE PLAX 2D LVIDd:         4.40 cm Diastology LVIDs:         3.20 cm LV e' medial:    6.09 cm/s LV PW:         1.00 cm LV E/e' medial:  14.6 LV IVS:        0.90 cm LV e' lateral:   8.05 cm/s                        LV E/e' lateral: 11.1  RIGHT VENTRICLE             IVC RV Basal diam:  3.20 cm     IVC diam: 1.70 cm RV S prime:     17.20 cm/s TAPSE (M-mode): 2.2 cm LEFT ATRIUM           Index        RIGHT ATRIUM           Index LA diam:      2.90 cm 1.55 cm/m   RA Area:     17.70 cm LA Vol (A2C): 39.3 ml 21.04 ml/m  RA Volume:   53.90 ml  28.85 ml/m LA Vol (A4C): 41.3 ml 22.11 ml/m  AORTIC VALVE AV Vmax:           167.00 cm/s AV Vmean:          112.000 cm/s AV VTI:            0.344 m AV Peak Grad:      11.2 mmHg AV Mean Grad:      6.0 mmHg LVOT Vmax:         99.80 cm/s LVOT Vmean:        70.000 cm/s LVOT VTI:          0.228 m LVOT/AV VTI ratio: 0.66  AORTA Ao Root diam: 3.30 cm Ao Asc diam:  3.40 cm MITRAL VALVE                TRICUSPID VALVE MV Area (PHT): 4.39 cm     TR Peak grad:   25.8 mmHg MV Decel Time: 173 msec     TR Vmax:        254.00 cm/s MV E velocity: 89.10 cm/s MV A velocity: 107.00 cm/s  SHUNTS MV E/A ratio:  0.83         Systemic VTI: 0.23 m Julien Nordmann MD Electronically signed by Julien Nordmann MD Signature Date/Time: 02/15/2023/5:50:53 PM    Final      ASSESSMENT & PLAN:   Carcinoma  of overlapping sites of right breast in female, estrogen receptor positive (HCC) # Right breast cancer -[facility]; ER/PR positive Her 2 Negative ;  G-3; LVI + T2N1.  Stage II [OCT 2023; Dr.Byrnett] s/p Lumpectomy; node positive-Oncotype RS- 35- s/p  adjuvant chemotherapy with Taxotere-Cytoxan every 3 weeks x4 cycles.  S/p postlumpectomy radiation April 3rd 2024. # Currently on anastrozole once a day [April 2024]-tolerating well without any major side effects.   # Given poor tolerance to adjuvant abemaciclib 100 mg BID [started second week of june-given ongoing fatigue/dyspnea-see below.  Recommend CONTINUE HOLDING abemaciclib at this time.   # Discussed regarding re-starting at reduced dose at 50 mg BID. Patient wants to start after the holidays. Also dicussed re:  Adjuvant zometa. Pt is interested; plan at next visit.   # END SEP 2024] Worsening dyspnea  2D echo 2022- EF- preserved; currently awaiting evaluation follow up with Cardiology; Dr.Etaang. Currently dyspnea improved- continue lasix/ kdur 20/20 every other day.  Stable.   # COPD [Dr.Gonzalez]-on Breztri/albuterol - stable.   # [2nd,FEB 2024] CTA-  acute pulmonary embolism, with new segmental to subsegmental embolus present in the right lower lobe/right lower lobe-pulmonary infarct. LEFT LE- Acute DVT-on anticoagulation with eliquis on 5 mg BID X 6 months- [provoked sec to chemo]. Stop eliquis in End of  AUG 2024.     # Bone health-2023 osteopenic T-score of -1.1.Continue calcium plus vitamin D.- stable.  I discussed the role of adjuvant Zometa every 6 months x 3 years. S/p dental evaluation -plan start next visit stable.    # Genetic testing: brother-& father- colon cancer; other brother- brain tumor; no breast cancers in family. pending genetic blood work today.   # Right arm decreased range of motion/swelling- ?  Lymphedema- s/p evaluation- Maureen/OT- stable.   # Vaccination: s/p COVID; Flu; OK with RSV.    # IV Access: PIV   # DISPOSITION: # Bilateral diagnostic mammogram  # follow in  6 weeks- -  MD;labs- cbc/cmp; BNP; zometa- Dr.B    All questions were answered. The patient/family knows to call the clinic with any problems, questions or concerns.    [name], MD 03/15/2023 3:50 PM

## 2023-03-15 NOTE — Assessment & Plan Note (Signed)
#   Right breast cancer -[facility]; ER/PR positive Her 2 Negative ;  G-3; LVI + T2N1.  Stage II [OCT 2023; Dr.Byrnett] s/p Lumpectomy; node positive-Oncotype RS- 35- s/p  adjuvant chemotherapy with Taxotere-Cytoxan every 3 weeks x4 cycles.  S/p postlumpectomy radiation April 3rd 2024. # Currently on anastrozole once a day [April 2024]-tolerating well without any major side effects.   # Given poor tolerance to adjuvant abemaciclib 100 mg BID [started second week of june-given ongoing fatigue/dyspnea-see below.  Recommend CONTINUE HOLDING abemaciclib at this time.   # Discussed regarding re-starting at reduced dose at 50 mg BID. Patient wants to start after the holidays. Also dicussed re:  Adjuvant zometa. Pt is interested; plan at next visit.   # END SEP 2024] Worsening dyspnea  2D echo 2022- EF- preserved; currently awaiting evaluation follow up with Cardiology; Dr.Etaang. Currently dyspnea improved- continue lasix/ kdur 20/20 every other day.  Stable.   # COPD [Dr.Gonzalez]-on Breztri/albuterol - stable.   # [2nd,FEB 2024] CTA-  acute pulmonary embolism, with new segmental to subsegmental embolus present in the right lower lobe/right lower lobe-pulmonary infarct. LEFT LE- Acute DVT-on anticoagulation with eliquis on 5 mg BID X 6 months- [provoked sec to chemo]. Stop eliquis in End of  AUG 2024.     # Bone health-2023 osteopenic T-score of -1.1.Continue calcium plus vitamin D.- stable.  I discussed the role of adjuvant Zometa every 6 months x 3 years. S/p dental evaluation -plan start next visit stable.    # Genetic testing: brother-& father- colon cancer; other brother- brain tumor; no breast cancers in family. pending genetic blood work today.   # Right arm decreased range of motion/swelling- ?  Lymphedema- s/p evaluation- Maureen/OT- stable.   # Vaccination: s/p COVID; Flu; OK with RSV.   # IV Access: PIV   # DISPOSITION: # Bilateral diagnostic mammogram  # follow in  6 weeks- -  MD;labs- cbc/cmp;  BNP; zometa- Dr.B

## 2023-03-15 NOTE — Progress Notes (Signed)
Eye surgery, eye lids 02/24/2023.

## 2023-03-16 ENCOUNTER — Other Ambulatory Visit: Payer: Self-pay

## 2023-03-20 ENCOUNTER — Ambulatory Visit: Payer: Medicare Other | Admitting: Internal Medicine

## 2023-03-20 ENCOUNTER — Other Ambulatory Visit: Payer: Medicare Other

## 2023-03-27 ENCOUNTER — Encounter: Payer: Self-pay | Admitting: Radiation Oncology

## 2023-03-27 ENCOUNTER — Ambulatory Visit
Admission: RE | Admit: 2023-03-27 | Discharge: 2023-03-27 | Disposition: A | Discharge status: Home / Self Care | Payer: Medicare Other | Source: Ambulatory Visit | Attending: Radiation Oncology | Admitting: Radiation Oncology

## 2023-03-27 VITALS — BP 149/91 | HR 83 | Temp 98.0°F | Resp 16 | Ht 66.0 in | Wt 170.5 lb

## 2023-03-27 DIAGNOSIS — Z170 Estrogen receptor positive status [ER+]: Secondary | ICD-10-CM | POA: Diagnosis not present

## 2023-03-27 DIAGNOSIS — C50811 Malignant neoplasm of overlapping sites of right female breast: Secondary | ICD-10-CM | POA: Diagnosis not present

## 2023-03-27 DIAGNOSIS — Z923 Personal history of irradiation: Secondary | ICD-10-CM | POA: Insufficient documentation

## 2023-03-27 DIAGNOSIS — Z79811 Long term (current) use of aromatase inhibitors: Secondary | ICD-10-CM | POA: Insufficient documentation

## 2023-03-27 NOTE — Progress Notes (Signed)
Radiation Oncology Follow up Note  Name: Sabrina Wilson   Date:   03/27/2023 MRN:  [id number] DOB: 23-Apr-1941    This 82 y.o. female presents to the clinic today for 71-month follow-up status post whole breast radiation to her right breast for stage II ER/PR positive invasive mammary carcinoma.  REFERRING PROVIDER: [name], MD  HPI: The patient, an [age]-year-old individual, is seven months post whole breast radiation for stage IIb ERPR positive invasive mammary carcinoma. She is currently on Arimidex, which she initially confused with another medication, but later confirmed. She also mentioned having been on Verzenio, which she found difficult to tolerate. She reports no current symptoms such as hot flashes. She does mention tenderness in the area of radiation, which she attributes to the treatment. She also recently had eye surgery, which led to a previous appointment cancellation..  Patient has mammogram tomorrow which I will review when the become available.  COMPLICATIONS OF TREATMENT: none  FOLLOW UP COMPLIANCE: keeps appointments   PHYSICAL EXAM:  BP (!) 149/91   Pulse 83   Temp 98 F (36.7 C)   Resp 16   Ht 5\' 6"  (1.676 m)   Wt 170 lb 8 oz (77.3 kg)   BMI 27.52 kg/m  Lungs are clear to A&P cardiac examination essentially unremarkable with regular rate and rhythm. No dominant mass or nodularity is noted in either breast in 2 positions examined. Incision is well-healed. No axillary or supraclavicular adenopathy is appreciated. Cosmetic result is excellent.  Well-developed well-nourished patient in NAD. HEENT reveals PERLA, EOMI, discs not visualized.  Oral cavity is clear. No oral mucosal lesions are identified. Neck is clear without evidence of cervical or supraclavicular adenopathy. Lungs are clear to A&P. Cardiac examination is essentially unremarkable with regular rate and rhythm without murmur rub or thrill. Abdomen is benign with no organomegaly or masses noted.  Motor sensory and DTR levels are equal and symmetric in the upper and lower extremities. Cranial nerves II through XII are grossly intact. Proprioception is intact. No peripheral adenopathy or edema is identified. No motor or sensory levels are noted. Crude visual fields are within normal range.  RADIOLOGY RESULTS: Mammograms to review reviewed after being performed tomorrow.  PLAN: Stage 2B ER/PR positive invasive mammary carcinoma Status post whole breast radiation to the right breast 7 months ago. Currently on Arimidex. Mammograms scheduled for tomorrow. No new symptoms or physical findings on examination. -Continue Arimidex. -Ensure mammogram results are shared with this office. -Follow-up in 6 months.    [name], MD

## 2023-03-28 ENCOUNTER — Ambulatory Visit
Admission: RE | Admit: 2023-03-28 | Discharge: 2023-03-28 | Disposition: A | Discharge status: Home / Self Care | Payer: Medicare Other | Source: Ambulatory Visit | Attending: Internal Medicine | Admitting: Internal Medicine

## 2023-03-28 DIAGNOSIS — C50811 Malignant neoplasm of overlapping sites of right female breast: Secondary | ICD-10-CM

## 2023-03-28 DIAGNOSIS — R92323 Mammographic fibroglandular density, bilateral breasts: Secondary | ICD-10-CM | POA: Diagnosis not present

## 2023-03-28 DIAGNOSIS — Z170 Estrogen receptor positive status [ER+]: Secondary | ICD-10-CM | POA: Insufficient documentation

## 2023-04-09 ENCOUNTER — Other Ambulatory Visit: Payer: Self-pay | Admitting: Family Medicine

## 2023-04-09 DIAGNOSIS — E785 Hyperlipidemia, unspecified: Secondary | ICD-10-CM

## 2023-04-14 ENCOUNTER — Other Ambulatory Visit: Payer: Self-pay | Admitting: Internal Medicine

## 2023-04-14 NOTE — Telephone Encounter (Signed)
  Component Ref Range & Units (hover) 1 mo ago 2 mo ago 3 mo ago 4 mo ago 6 mo ago 8 mo ago 10 mo ago  Potassium 3.7 3.9 3.7 3.9 3.9

## 2023-04-29 ENCOUNTER — Other Ambulatory Visit: Payer: Self-pay | Admitting: Internal Medicine

## 2023-05-01 ENCOUNTER — Encounter: Payer: Self-pay | Admitting: Internal Medicine

## 2023-05-01 ENCOUNTER — Other Ambulatory Visit (HOSPITAL_COMMUNITY): Payer: Self-pay

## 2023-05-02 ENCOUNTER — Other Ambulatory Visit: Payer: Self-pay

## 2023-05-03 ENCOUNTER — Other Ambulatory Visit (HOSPITAL_COMMUNITY): Payer: Self-pay

## 2023-05-03 ENCOUNTER — Inpatient Hospital Stay: Payer: Medicare Other

## 2023-05-03 ENCOUNTER — Inpatient Hospital Stay: Payer: Medicare Other | Attending: Internal Medicine

## 2023-05-03 ENCOUNTER — Inpatient Hospital Stay (HOSPITAL_BASED_OUTPATIENT_CLINIC_OR_DEPARTMENT_OTHER): Payer: Medicare Other | Admitting: Internal Medicine

## 2023-05-03 ENCOUNTER — Encounter: Payer: Self-pay | Admitting: Internal Medicine

## 2023-05-03 ENCOUNTER — Other Ambulatory Visit: Payer: Self-pay

## 2023-05-03 VITALS — BP 166/81 | HR 72 | Temp 96.7°F | Ht 66.0 in | Wt 171.8 lb

## 2023-05-03 DIAGNOSIS — M7989 Other specified soft tissue disorders: Secondary | ICD-10-CM | POA: Diagnosis not present

## 2023-05-03 DIAGNOSIS — Z79811 Long term (current) use of aromatase inhibitors: Secondary | ICD-10-CM | POA: Diagnosis not present

## 2023-05-03 DIAGNOSIS — M8580 Other specified disorders of bone density and structure, unspecified site: Secondary | ICD-10-CM | POA: Insufficient documentation

## 2023-05-03 DIAGNOSIS — Z1732 Human epidermal growth factor receptor 2 negative status: Secondary | ICD-10-CM | POA: Insufficient documentation

## 2023-05-03 DIAGNOSIS — Z170 Estrogen receptor positive status [ER+]: Secondary | ICD-10-CM | POA: Diagnosis not present

## 2023-05-03 DIAGNOSIS — Z85038 Personal history of other malignant neoplasm of large intestine: Secondary | ICD-10-CM | POA: Insufficient documentation

## 2023-05-03 DIAGNOSIS — R0600 Dyspnea, unspecified: Secondary | ICD-10-CM | POA: Diagnosis not present

## 2023-05-03 DIAGNOSIS — Z9221 Personal history of antineoplastic chemotherapy: Secondary | ICD-10-CM | POA: Diagnosis not present

## 2023-05-03 DIAGNOSIS — Z1721 Progesterone receptor positive status: Secondary | ICD-10-CM | POA: Diagnosis not present

## 2023-05-03 DIAGNOSIS — C50811 Malignant neoplasm of overlapping sites of right female breast: Secondary | ICD-10-CM

## 2023-05-03 DIAGNOSIS — Z87891 Personal history of nicotine dependence: Secondary | ICD-10-CM | POA: Insufficient documentation

## 2023-05-03 LAB — CMP (CANCER CENTER ONLY)
ALT: 17 U/L (ref 0–44)
AST: 24 U/L (ref 15–41)
Albumin: 4.3 g/dL (ref 3.5–5.0)
Alkaline Phosphatase: 49 U/L (ref 38–126)
Anion gap: 10 (ref 5–15)
BUN: 26 mg/dL — ABNORMAL HIGH (ref 8–23)
CO2: 26 mmol/L (ref 22–32)
Calcium: 10.2 mg/dL (ref 8.9–10.3)
Chloride: 101 mmol/L (ref 98–111)
Creatinine: 0.87 mg/dL (ref 0.44–1.00)
GFR, Estimated: >60 mL/min (ref >60)
Glucose, Bld: 103 mg/dL — ABNORMAL HIGH (ref 70–99)
Potassium: 3.8 mmol/L (ref 3.5–5.1)
Sodium: 137 mmol/L (ref 135–145)
Total Bilirubin: 0.8 mg/dL (ref 0.0–1.2)
Total Protein: 6.9 g/dL (ref 6.5–8.1)

## 2023-05-03 LAB — CBC WITH DIFFERENTIAL (CANCER CENTER ONLY)
Abs Immature Granulocytes: 0.02 10*3/uL (ref 0.00–0.07)
Basophils Absolute: 0.1 10*3/uL (ref 0.0–0.1)
Basophils Relative: 1 %
Eosinophils Absolute: 0.3 10*3/uL (ref 0.0–0.5)
Eosinophils Relative: 5 %
HCT: 36.6 % (ref 36.0–46.0)
Hemoglobin: 12 g/dL (ref 12.0–15.0)
Immature Granulocytes: 0 %
Lymphocytes Relative: 20 %
Lymphs Abs: 1.3 10*3/uL (ref 0.7–4.0)
MCH: 33.1 pg (ref 26.0–34.0)
MCHC: 32.8 g/dL (ref 30.0–36.0)
MCV: 101.1 fL — ABNORMAL HIGH (ref 80.0–100.0)
Monocytes Absolute: 0.7 10*3/uL (ref 0.1–1.0)
Monocytes Relative: 11 %
Neutro Abs: 3.9 10*3/uL (ref 1.7–7.7)
Neutrophils Relative %: 63 %
Platelet Count: 200 10*3/uL (ref 150–400)
RBC: 3.62 MIL/uL — ABNORMAL LOW (ref 3.87–5.11)
RDW: 13.6 % (ref 11.5–15.5)
WBC Count: 6.2 10*3/uL (ref 4.0–10.5)
nRBC: 0 % (ref 0.0–0.2)

## 2023-05-03 LAB — BRAIN NATRIURETIC PEPTIDE: B Natriuretic Peptide: 230.1 pg/mL — ABNORMAL HIGH (ref 0.0–100.0)

## 2023-05-03 MED ORDER — ABEMACICLIB 50 MG PO TABS
50.0000 mg | ORAL_TABLET | Freq: Two times a day (BID) | ORAL | 3 refills | Status: DC
  Filled 2023-05-03: qty 70, 35d supply, fill #0

## 2023-05-03 MED ORDER — ABEMACICLIB 50 MG PO TABS: 100.0000 mg | ORAL_TABLET | Freq: Two times a day (BID) | ORAL | 3 refills | Status: DC

## 2023-05-03 MED ORDER — SODIUM CHLORIDE 0.9 % IV SOLN
INTRAVENOUS | Status: DC
Start: 2023-05-03 — End: 2023-05-03
  Filled 2023-05-03: qty 250

## 2023-05-03 MED ORDER — ZOLEDRONIC ACID 4 MG/100ML IV SOLN
4.0000 mg | Freq: Once | INTRAVENOUS | Status: AC
  Administered 2023-05-03: 4 mg via INTRAVENOUS
  Filled 2023-05-03: qty 100

## 2023-05-03 NOTE — Progress Notes (Signed)
 Mammogram done 03/28/23.

## 2023-05-03 NOTE — Patient Instructions (Signed)

## 2023-05-03 NOTE — Assessment & Plan Note (Signed)
#   Right breast cancer -[facility]; ER/PR positive Her 2 Negative ;  G-3; LVI + T2N1.  Stage II [OCT 2023; Dr.Byrnett] s/p Lumpectomy; node positive-Oncotype RS- 35- s/p  adjuvant chemotherapy with Taxotere -Cytoxan  every 3 weeks x4 cycles.  S/p postlumpectomy radiation April 3rd 2024. # Currently on anastrozole  once a day [April 2024]-tolerating well without any major side effects. Poor tolerance to adjuvant abemaciclib  100 mg BID [started second week of June].  -given ongoing fatigue/dyspnea-see below.  Recommend CONTINUE HOLDING abemaciclib  at this time.   # Discussed regarding re-starting at reduced dose at 50 mg BID. Patient wants to start after the holidays. Also dicussed re:  Adjuvant zometa . Pt is interested; plan at next visit.   # END SEP 2024] Worsening dyspnea  2D echo 2022- EF- preserved; currently awaiting evaluation follow up with Cardiology; Dr.Etaang. Currently dyspnea improved- continue lasix / kdur 20/20 every other day.  Stable.   # COPD [Dr.Gonzalez]-on Breztri /albuterol  - stable.   # [2nd,FEB 2024] CTA-  acute pulmonary embolism, with new segmental to subsegmental embolus present in the right lower lobe/right lower lobe-pulmonary infarct. LEFT LE- Acute DVT-on anticoagulation with eliquis  on 5 mg BID X 6 months- [provoked sec to chemo]. Stop eliquis  in End of  AUG 2024.     # Bone health-2023 osteopenic T-score of -1.1.Continue calcium  plus vitamin D .- stable.  I discussed the role of adjuvant Zometa  every 6 months x 3 years. S/p dental evaluation -plan start  today.    # Genetic testing: brother-& father- colon cancer; other brother- brain tumor; no breast cancers in family. pending genetic blood work today.   # Right arm decreased range of motion/swelling- ?  Lymphedema- s/p evaluation- Maureen/OT- stable.   # Vaccination: s/p COVID; Flu; OK with RSV.   # IV Access: PIV   # DISPOSITION: # Zometa  today # follow in  4  weeks- -  MD;labs- cbc/cmp- Dr.B

## 2023-05-03 NOTE — Progress Notes (Signed)
 [facility] CONSULT NOTE  Patient Care Team: Maribeth Camellia MATSU, MD as PCP - General (Family Medicine) [name], MD as PCP - Cardiology (Cardiology) Dessa, [name], MD as Consulting Physician (General Surgery) [name], MD (Internal Medicine) [name], RN as Oncology Nurse Navigator [name], MD as Consulting Physician (Radiation Oncology) [name], MD as Consulting Physician (Internal Medicine) [name], DO as Consulting Physician (General Surgery)  CHIEF COMPLAINTS/PURPOSE OF CONSULTATION: Breast cancer  #  Oncology History Overview Note  # 2009- Sigmoid colon cancer stage II (T3 N0 4 lymph nodes sampled M0 stage II high risk there was obstruction and perforation abscess; Dr.Hern), workup included a low CEA preoperatively and a chest x-ray and CT scan abdomen pelvis negative for metastatic disease, S/P colostomy; s/p FOLFOX x 6 months. [Dr.Gittin ]  # LATER REVERSED , K RAS  wild type B RAF not evaluated   Also initial iron deficiency with anemia thrombocytosis, high platelets considered reactive, workup includes a normal PCR for CML and a negative [id number] mutation  DIAGNOSIS:  A. BREAST, RIGHT; LUMPECTOMY:  - INVASIVE MAMMARY CARCINOMA.  - DUCTAL CARCINOMA IN SITU (DCIS).  - SEE CANCER SUMMARY BELOW.  - BIOPSY SITE CHANGE WITH CLIP (2).  - FIBROUS TISSUE WITH CYST FORMATION AND FLORID DUCTAL HYPERPLASIA.  - SKIN AND NIPPLE NEGATIVE FOR MALIGNANCY.  - VASCULAR CALCIFICATION.   B. SENTINEL LYMPH NODES 1 AND 2, RIGHT AXILLA; EXCISION:  - METASTATIC CARCINOMA INVOLVES ONE OF TWO LYMPH NODES (1/2).   C. NON-SENTINEL LYMPH NODES, RIGHT AXILLA; EXCISION:  - ONE LYMPH NODE NEGATIVE FOR MALIGNANCY (0/1).   CANCER CASE SUMMARY: INVASIVE CARCINOMA OF THE BREAST  Standard(s): AJCC-UICC 8   SPECIMEN  Procedure: Lumpectomy  Specimen Laterality: Right   TUMOR  Histologic Type: Invasive carcinoma of no special type  (ductal)  Histologic Grade (Nottingham Histologic Score)       Glandular (Acinar)/Tubular Differentiation: 3       Nuclear Pleomorphism: 3       Mitotic Rate: 3       Overall Grade: 3  Tumor Size: 24 mm  Tumor Focality: Single focus of invasive carcinoma  Ductal Carcinoma In Situ (DCIS): Present, nuclear grade 2-3  Tumor Extent: Not applicable  Lymphatic and/or Vascular Invasion: Present  Treatment Effect in the Breast: No known presurgical therapy   MARGINS  Margin Status for Invasive Carcinoma: All margins negative for invasive  carcinoma       Distance from closest margin: 10 mm       Specify closest margin: Superior   Margin Status for DCIS: All margins negative for DCIS       Distance from DCIS to closest margin: 10 mm       Specify closest margin: Superior   REGIONAL LYMPH NODES  Regional Lymph Node Status: Tumor present in regional lymph node(s)       Number of Lymph Nodes with Macrometastases (greater than 2 mm): 1       Number of Lymph Nodes with Micrometastases (greater than 0.2 mm to  2 mm and/or greater than 200 cells): 0       Number of Lymph Nodes with Isolated Tumor Cells (0.2 mm or less OR  200 cells or less): 0       Size of Largest Metastatic Deposit: 3 mm      Extranodal Extension: Not identified       Total Number of Lymph Nodes Examined (sentinel and non-sentinel): 3  Number of Sentinel Nodes Examined: 2   DISTANT METASTASIS  Distant Site(s) Involved, if applicable: Not applicable   PATHOLOGIC STAGE CLASSIFICATION (pTNM, AJCC 8th Edition):  Modified Classification: Not applicable  pT Category: pT2  T Suffix: Not applicable  pN Category: pN1a  N Suffix: (sn)  pM Category: Not applicable   SPECIAL STUDIES  Breast Biomarker Testing Performed on Previous Outside Biopsy:  23-250-T11  Per outside report:  Estrogen Receptor (ER) Status: POSITIVE          Percentage of cells with nuclear positivity: Greater than 90%          Average intensity of  staining: Strong   Progesterone Receptor (PgR) Status: POSITIVE          Percentage of cells with nuclear positivity: 60%          Average intensity of staining: Strong   HER2 (by immunohistochemistry): EQUIVOCAL (Score 2+)  HER2 FISH: NEGATIVE   Ki-67: Not performed   # Cytoxan  Taxotere  x 4 cycles; finished JAN, 2024. [Neutropenic fevers s/p cycle #4;   -FEB 2nd, 2024-right lung PE; left lower LE  DVT- on eliquis .   # FEB 19th-start RT; mpectomy radiation April 3rd 2024.  # anastrozole  once a day [April 2024]-t #  Poor tolerance to adjuvant abemaciclib  100 mg BID [started second week of June] stopped x 1 month.    # JAN 8th 2024- start abemaciclib  50 mg BID       Cancer of sigmoid (HCC) (Resolved)  Carcinoma of overlapping sites of right breast in female, estrogen receptor positive (HCC)  02/14/2022 Initial Diagnosis   Carcinoma of overlapping sites of right breast in female, estrogen receptor positive (HCC)   02/14/2022 Cancer Staging   Staging form: Breast, AJCC 8th Edition - Pathologic: Stage IIA (pT2, pN1, cM0, G3, ER+, PR+, HER2-) - Signed by [name], MD on 02/14/2022 Histologic grading system: 3 grade system   02/23/2022 -  Chemotherapy   Patient is on Treatment Plan : BREAST TC q21d      HISTORY OF PRESENTING ILLNESS:   Alone.  Ambulating independently.   Sabrina Wilson 83 y.o.  female patient with ER/PR positive Her 2 Negative  breast cancer stage II T2 N1-currently  adjuvant anastrozole  is here for a follow up/ and review the mammogram.   Since stopping verzinio she has noted to have improvement of the fatigue. Dyspnea also improved.   Her leg swelling is improved overall. .  Denies any blood in the black or stools.  Review of Systems  Constitutional:  Positive for malaise/fatigue and weight loss. Negative for chills, diaphoresis and fever.  HENT:  Negative for nosebleeds and sore throat.   Eyes:  Negative for double vision.  Respiratory:   Positive for cough and shortness of breath. Negative for hemoptysis, sputum production and wheezing.   Cardiovascular:  Negative for chest pain, palpitations, orthopnea and leg swelling.  Gastrointestinal:  Positive for abdominal pain and nausea. Negative for blood in stool, constipation, diarrhea, heartburn, melena and vomiting.  Genitourinary:  Negative for dysuria, frequency and urgency.  Musculoskeletal:  Negative for back pain and joint pain.  Skin: Negative.  Negative for itching and rash.  Neurological:  Negative for dizziness, tingling, focal weakness, weakness and headaches.  Endo/Heme/Allergies:  Does not bruise/bleed easily.  Psychiatric/Behavioral:  Negative for depression. The patient is not nervous/anxious and does not have insomnia.      MEDICAL HISTORY:  Past Medical History:  Diagnosis Date   Actinic keratosis  02/12/2020   R lat calf (hypertrophic)   Arthritis    Cancer of sigmoid (HCC) 06/17/2016   Colon cancer (HCC) 2009   T3,N0; s/p resection  Dr. Hearn and chemotherapy by Dr. Brooks   Diastolic dysfunction    a. 08/2020 Echo: EF 60-65%, no rwma, Gr1 DD, nl RV size/fxn. Mild MR.   Hernia of flank    History of actinic keratoses 08/11/2020   Bx proven at left lateral ankle proximal, LN2 10/08/20   History of stress test    a. 09/2020 MV: EF >65%. No ischemia/infarct. Mild Ao Ca2+ and minimal Cor Ca2+.   Hypertension    Neuropathy    Personal history of radiation therapy 2023   right breast CA   Polyp at cervical os    s/p resection Dr. Cleotilde   Skin cancer 01/22/2020   surface of an atypical verrucous squamous proliferation    Squamous cell carcinoma of skin 07/16/2020   Left lateral ankle anterior, treated with [facility] 08-11-2020    SURGICAL HISTORY: Past Surgical History:  Procedure Laterality Date   BREAST BIOPSY Right 05/2014   Dr. Fredirick office-benign   BREAST LUMPECTOMY WITH SENTINEL LYMPH NODE BIOPSY Right 01/21/2022   Procedure: BREAST LUMPECTOMY WITH  SENTINEL LYMPH NODE BX;  Surgeon: Dessa [name], MD;  Location: [facility] ORS;  Service: General;  Laterality: Right;   BREAST SURGERY Right February 2016   Vacuum assisted biopsy for recurrent cyst, fibrocystic changes without evidence of malignancy.   CATARACT EXTRACTION W/PHACO Left 01/13/2015   Procedure: CATARACT EXTRACTION PHACO AND INTRAOCULAR LENS PLACEMENT (IOC);  Surgeon: Elsie Carmine, MD;  Location: [facility] ORS;  Service: Ophthalmology;  Laterality: Left;  US   1:02.9 AP   19.2 CDE  12.06 casette lot #  [id number]   CATARACT EXTRACTION W/PHACO Right 02/03/2015   Procedure: CATARACT EXTRACTION PHACO AND INTRAOCULAR LENS PLACEMENT (IOC);  Surgeon: Elsie Carmine, MD;  Location: [facility] ORS;  Service: Ophthalmology;  Laterality: Right;  us00:53 ap46.5 cde10.79   COLECTOMY     COLONOSCOPY  2015   Dr Ora   COLONOSCOPY WITH PROPOFOL  N/A 09/13/2017   Procedure: COLONOSCOPY WITH PROPOFOL ;  Surgeon: Dessa [name], MD;  Location: [facility] ENDOSCOPY;  Service: Endoscopy;  Laterality: N/A;   COLONOSCOPY WITH PROPOFOL  N/A 11/08/2017   Procedure: COLONOSCOPY WITH PROPOFOL ;  Surgeon: Dessa [name], MD;  Location: [facility] ENDOSCOPY;  Service: Endoscopy;  Laterality: N/A;   colostomy reversal     DILATION AND CURETTAGE OF UTERUS  2014   Dr. Cleotilde, benign per pt   EYE SURGERY     HERNIA REPAIR  07/13/12   ventral    SKIN CANCER EXCISION     TONSILLECTOMY      SOCIAL HISTORY: Social History   Socioeconomic History   Marital status: Married    Spouse name: Not on file   Number of children: Not on file   Years of education: Not on file   Highest education level: Not on file  Occupational History   Not on file  Tobacco Use   Smoking status: Former    Current packs/day: 0.00    Types: Cigarettes    Quit date: 10/12/2007    Years since quitting: 15.5   Smokeless tobacco: Never  Vaping Use   Vaping status: Never Used  Substance and Sexual Activity   Alcohol use: Yes    Alcohol/week:  1.0 standard drink of alcohol    Types: 1 Standard drinks or equivalent per week    Comment: with  dinner GLASS OF WINE EACH DAY   Drug use: No   Sexual activity: Not on file  Other Topics Concern   Not on file  Social History Narrative   Lives in [place] with husband. 2 children, son lives nearby.Diet - regularExercise - none. 30 mins/in [place]. Quit smoking in 2009. Wine before dinner. Pianist in church; real estate; diplomatic services operational officer.    Social Drivers of Corporate Investment Banker Strain: Low Risk  (11/21/2022)   Overall Financial Resource Strain (CARDIA)    Difficulty of Paying Living Expenses: Not hard at all  Food Insecurity: No Food Insecurity (11/21/2022)   Hunger Vital Sign    Worried About Running Out of Food in the Last Year: Never true    Ran Out of Food in the Last Year: Never true  Transportation Needs: No Transportation Needs (11/21/2022)   PRAPARE - Administrator, Civil Service (Medical): No    Lack of Transportation (Non-Medical): No  Physical Activity: Inactive (11/21/2022)   Exercise Vital Sign    Days of Exercise per Week: 0 days    Minutes of Exercise per Session: 0 min  Stress: No Stress Concern Present (11/21/2022)   [employer] of Occupational Health - Occupational Stress Questionnaire    Feeling of Stress : Not at all  Social Connections: Socially Integrated (11/21/2022)   Social Connection and Isolation Panel [NHANES]    Frequency of Communication with Friends and Family: More than three times a week    Frequency of Social Gatherings with Friends and Family: More than three times a week    Attends Religious Services: More than 4 times per year    Active Member of [employer] or Organizations: Yes    Attends Engineer, Structural: More than 4 times per year    Marital Status: Married  Catering Manager Violence: Not At Risk (11/21/2022)   Humiliation, Afraid, Rape, and Kick questionnaire    Fear of Current or Ex-Partner: No     Emotionally Abused: No    Physically Abused: No    Sexually Abused: No    FAMILY HISTORY: Family History  Problem Relation Age of Onset   Stroke Mother    Diabetes Mother    Colon cancer Father        dx 17s   Stroke Sister    Colon cancer Brother        dx 54s-70s   Brain cancer Brother        dx 60s-70s   Breast cancer Neg Hx     ALLERGIES:  is allergic to sulfa antibiotics.  MEDICATIONS:  Current Outpatient Medications  Medication Sig Dispense Refill   albuterol  (VENTOLIN  HFA) 108 (90 Base) MCG/ACT inhaler Inhale 2 puffs into the lungs every 6 (six) hours as needed for wheezing or shortness of breath. 8 g 2   anastrozole  (ARIMIDEX ) 1 MG tablet TAKE 1 TABLET BY MOUTH DAILY 90 tablet 0   atorvastatin  (LIPITOR) 10 MG tablet TAKE 1 TABLET BY MOUTH DAILY 90 tablet 0   Budeson-Glycopyrrol-Formoterol (BREZTRI  AEROSPHERE) 160-9-4.8 MCG/ACT AERO Inhale 2 puffs into the lungs in the morning and at bedtime. 1 each 11   Calcium  Carbonate-Vit D-Min (CALCIUM  1200 PO) Take 1 tablet by mouth daily.     furosemide  (LASIX ) 20 MG tablet TAKE 1 TABLET BY MOUTH EVERY OTHER DAY 30 tablet 0   halobetasol  (ULTRAVATE ) 0.05 % cream Apply topically to aa's of rash at body twice daily on weekends only as needed. Avoid applying  to face, groin, and axilla. Use as directed. 50 g 1   KLOR-CON  M20 20 MEQ tablet TAKE 1 TABLET BY MOUTH EVERY OTHER DAY 30 tablet 0   loratadine (CLARITIN) 10 MG tablet Take 10 mg by mouth daily.     Multiple Vitamins-Minerals (PRESERVISION AREDS 2 PO)      niacinamide 500 MG tablet Take 500 mg by mouth 2 (two) times daily.     Omega-3 Fatty Acids (FISH OIL PO) Take 1 capsule by mouth daily.     Spacer/Aero-Holding Chambers (AEROCHAMBER MV) inhaler Use as instructed 1 each 0   tacrolimus  (PROTOPIC ) 0.1 % ointment Apply topically to affected areas of rash twice daily on week days only prn 60 g 1   VITAMIN D , CHOLECALCIFEROL, PO Take 2,000 Units by mouth daily.     abemaciclib   (VERZENIO ) 50 MG tablet Take 1 tablet (50 mg total) by mouth 2 (two) times daily. 60 tablet 3   No current facility-administered medications for this visit.   Facility-Administered Medications Ordered in Other Visits  Medication Dose Route Frequency Provider Last Rate Last Admin   0.9 %  sodium chloride  infusion   Intravenous Continuous Kendricks Reap R, MD       Zoledronic  Acid (ZOMETA ) IVPB 4 mg  4 mg Intravenous Once Kimmy Parish R, MD        PHYSICAL EXAMINATION:   Vitals:   05/03/23 1507 05/03/23 1517  BP: (!) 156/83 (!) 166/81  Pulse: 72   Temp: (!) 96.7 F (35.9 C)   SpO2: 99%     Filed Weights   05/03/23 1507  Weight: 171 lb 12.8 oz (77.9 kg)     Physical Exam Vitals and nursing note reviewed.  HENT:     Head: Normocephalic and atraumatic.     Mouth/Throat:     Pharynx: Oropharynx is clear.  Eyes:     Extraocular Movements: Extraocular movements intact.     Pupils: Pupils are equal, round, and reactive to light.  Cardiovascular:     Rate and Rhythm: Normal rate and regular rhythm.  Pulmonary:     Comments: Decreased breath sounds bilaterally.  Abdominal:     Palpations: Abdomen is soft.  Musculoskeletal:        General: Normal range of motion.     Cervical back: Normal range of motion.  Skin:    General: Skin is warm.  Neurological:     General: No focal deficit present.     Mental Status: She is alert and oriented to person, place, and time.  Psychiatric:        Behavior: Behavior normal.        Judgment: Judgment normal.      LABORATORY DATA:  I have reviewed the data as listed Lab Results  Component Value Date   WBC 6.2 05/03/2023   HGB 12.0 05/03/2023   HCT 36.6 05/03/2023   MCV 101.1 (H) 05/03/2023   PLT 200 05/03/2023   Recent Labs    02/01/23 0920 03/15/23 1505 05/03/23 1500  NA 137 138 137  K 3.9 3.7 3.8  CL 100 100 101  CO2 27 25 26   GLUCOSE 109* 102* 103*  BUN 19 22 26*  CREATININE 1.19* 0.95 0.87  CALCIUM   9.8 10.3 10.2  GFRNONAA 46* >60 >60  PROT 7.2 7.6 6.9  ALBUMIN 4.4 4.2 4.3  AST 27 20 24   ALT 18 14 17   ALKPHOS 55 78 49  BILITOT 0.8 0.3 0.8    RADIOGRAPHIC STUDIES: I  have personally reviewed the radiological images as listed and agreed with the findings in the report. No results found.  ASSESSMENT & PLAN:   Carcinoma of overlapping sites of right breast in female, estrogen receptor positive (HCC) # Right breast cancer -[facility]; ER/PR positive Her 2 Negative ;  G-3; LVI + T2N1.  Stage II [OCT 2023; Dr.Byrnett] s/p Lumpectomy; node positive-Oncotype RS- 35- s/p  adjuvant chemotherapy with Taxotere -Cytoxan  every 3 weeks x4 cycles.  S/p postlumpectomy radiation April 3rd 2024. # Currently on anastrozole  once a day [April 2024]-tolerating well without any major side effects. Poor tolerance to adjuvant abemaciclib  100 mg BID [started second week of June].  -given ongoing fatigue/dyspnea-see below.  Recommend CONTINUE HOLDING abemaciclib  at this time.   # Discussed regarding re-starting at reduced dose at 50 mg BID. Patient wants to start after the holidays. Also dicussed re:  Adjuvant zometa . Pt is interested; plan at next visit.   # END SEP 2024] Worsening dyspnea  2D echo 2022- EF- preserved; currently awaiting evaluation follow up with Cardiology; Dr.Etaang. Currently dyspnea improved- continue lasix / kdur 20/20 every other day.  Stable.   # COPD [Dr.Gonzalez]-on Breztri /albuterol  - stable.   # [2nd,FEB 2024] CTA-  acute pulmonary embolism, with new segmental to subsegmental embolus present in the right lower lobe/right lower lobe-pulmonary infarct. LEFT LE- Acute DVT-on anticoagulation with eliquis  on 5 mg BID X 6 months- [provoked sec to chemo]. Stop eliquis  in End of  AUG 2024.     # Bone health-2023 osteopenic T-score of -1.1.Continue calcium  plus vitamin D .- stable.  I discussed the role of adjuvant Zometa  every 6 months x 3 years. S/p dental evaluation -plan start  today.    #  Genetic testing: brother-& father- colon cancer; other brother- brain tumor; no breast cancers in family. pending genetic blood work today.   # Right arm decreased range of motion/swelling- ?  Lymphedema- s/p evaluation- Maureen/OT- stable.   # Vaccination: s/p COVID; Flu; OK with RSV.   # IV Access: PIV   # DISPOSITION: # Zometa  today # follow in  4  weeks- -  MD;labs- cbc/cmp- Dr.B     All questions were answered. The patient/family knows to call the clinic with any problems, questions or concerns.    Cindy JONELLE Joe, MD 05/03/2023 3:51 PM

## 2023-05-04 ENCOUNTER — Telehealth: Payer: Self-pay

## 2023-05-04 ENCOUNTER — Other Ambulatory Visit (HOSPITAL_COMMUNITY): Payer: Self-pay

## 2023-05-04 ENCOUNTER — Other Ambulatory Visit: Payer: Self-pay

## 2023-05-04 ENCOUNTER — Other Ambulatory Visit: Payer: Self-pay | Admitting: Pharmacist

## 2023-05-04 ENCOUNTER — Telehealth: Payer: Self-pay | Admitting: Internal Medicine

## 2023-05-04 DIAGNOSIS — C50811 Malignant neoplasm of overlapping sites of right female breast: Secondary | ICD-10-CM

## 2023-05-04 MED ORDER — ABEMACICLIB 50 MG PO TABS
50.0000 mg | ORAL_TABLET | Freq: Two times a day (BID) | ORAL | 3 refills | Status: DC
  Filled 2023-05-04: qty 56, 28d supply, fill #0
  Filled 2023-05-23: qty 56, 28d supply, fill #1
  Filled 2023-06-19: qty 56, 28d supply, fill #2
  Filled 2023-07-20: qty 56, 28d supply, fill #3

## 2023-05-04 NOTE — Progress Notes (Signed)
 Specialty Pharmacy Ongoing Clinical Assessment Note  Sabrina Wilson is a 83 y.o. female who is being followed by the specialty pharmacy service for RxSp Oncology   Patient's specialty medication(s) reviewed today: Verzenio , which is being restarted at a lower dose of 50 mg BID  Missed doses in the last 4 weeks: More than 5 (medication was on hold over the holidays)   Patient/Caregiver did not have any additional questions or concerns.   Therapeutic benefit summary: Patient is achieving benefit   Adverse events/side effects summary: Experienced adverse events/side effects (on higher dose patient was experiencing very low energy levels, taste disturbances, diarrhea, and SOB)   Patient's therapy is appropriate to: Continue    Goals Addressed             This Visit's Progress    Achieve or maintain remission       Patient is on track. Patient will maintain adherence.          Follow up:  6 months  Silvano LOISE Dolly Specialty Pharmacist

## 2023-05-04 NOTE — Progress Notes (Signed)
 PAF Grant Obtained 11/04/22 - 05/02/24. Please bill PAF secondary for Verzenio.    Ardeen Fillers, CPhT Oncology Pharmacy Patient Advocate  [facility]  [phone] (phone) [phone] (fax) 05/04/2023 10:29 AM

## 2023-05-04 NOTE — Telephone Encounter (Signed)
 Oral Oncology Patient Advocate Encounter   Was successful in securing patient a $6,500.00 grant from [employer] (PAF) to provide copayment coverage for Verzenio .  This will keep the out of pocket expense at $0.    The billing information is as follows and has been shared with [facility].   RxBin: [id number] PCN:  [id number] Member ID: [id number] Group ID: [id number] Dates of Eligibility: 11/04/22 through 05/02/24   Morene Potters, CPhT Oncology Pharmacy Patient Advocate  [facility]  [phone] (phone) [phone] (fax) 05/04/2023 10:13 AM

## 2023-05-04 NOTE — Telephone Encounter (Signed)
 Patient left VM requesting a call back to r/s her appointment with Dr. Donneta Romberg on 05/31/23.

## 2023-05-04 NOTE — Progress Notes (Signed)
 Specialty Pharmacy Refill Coordination Note  Sabrina Wilson is a 83 y.o. female contacted today regarding refills of specialty medication(s) No medications found.   Patient requested Delivery   Delivery date: 05/05/23   Verified address: [address]  [place] [address]   Medication will be filled on 05/04/23.

## 2023-05-23 ENCOUNTER — Other Ambulatory Visit (HOSPITAL_COMMUNITY): Payer: Self-pay

## 2023-05-23 ENCOUNTER — Other Ambulatory Visit (HOSPITAL_COMMUNITY): Payer: Self-pay | Admitting: Pharmacy Technician

## 2023-05-23 NOTE — Progress Notes (Signed)
Specialty Pharmacy Refill Coordination Note  Sabrina Wilson is a 83 y.o. female contacted today regarding refills of specialty medication(s) Abemaciclib Sabrina Wilson)   Patient requested Delivery   Delivery date: 05/31/23   Verified address: [address] [place]   Medication will be filled on 05/30/23.

## 2023-05-29 ENCOUNTER — Encounter: Payer: Self-pay | Admitting: Internal Medicine

## 2023-05-29 ENCOUNTER — Ambulatory Visit: Payer: Medicare Other | Admitting: Internal Medicine

## 2023-05-29 ENCOUNTER — Inpatient Hospital Stay: Payer: Medicare Other | Attending: Internal Medicine

## 2023-05-29 ENCOUNTER — Inpatient Hospital Stay (HOSPITAL_BASED_OUTPATIENT_CLINIC_OR_DEPARTMENT_OTHER): Payer: Medicare Other | Admitting: Internal Medicine

## 2023-05-29 ENCOUNTER — Other Ambulatory Visit: Payer: Medicare Other

## 2023-05-29 VITALS — BP 166/91 | HR 74 | Temp 97.8°F | Ht 66.0 in | Wt 171.6 lb

## 2023-05-29 DIAGNOSIS — C50811 Malignant neoplasm of overlapping sites of right female breast: Secondary | ICD-10-CM

## 2023-05-29 DIAGNOSIS — Z170 Estrogen receptor positive status [ER+]: Secondary | ICD-10-CM

## 2023-05-29 DIAGNOSIS — Z79811 Long term (current) use of aromatase inhibitors: Secondary | ICD-10-CM | POA: Insufficient documentation

## 2023-05-29 DIAGNOSIS — M8580 Other specified disorders of bone density and structure, unspecified site: Secondary | ICD-10-CM | POA: Diagnosis not present

## 2023-05-29 LAB — CBC WITH DIFFERENTIAL (CANCER CENTER ONLY)
Abs Immature Granulocytes: 0.02 10*3/uL (ref 0.00–0.07)
Basophils Absolute: 0.1 10*3/uL (ref 0.0–0.1)
Basophils Relative: 1 %
Eosinophils Absolute: 0.2 10*3/uL (ref 0.0–0.5)
Eosinophils Relative: 4 %
HCT: 35.1 % — ABNORMAL LOW (ref 36.0–46.0)
Hemoglobin: 11.6 g/dL — ABNORMAL LOW (ref 12.0–15.0)
Immature Granulocytes: 0 %
Lymphocytes Relative: 25 %
Lymphs Abs: 1.1 10*3/uL (ref 0.7–4.0)
MCH: 33 pg (ref 26.0–34.0)
MCHC: 33 g/dL (ref 30.0–36.0)
MCV: 99.7 fL (ref 80.0–100.0)
Monocytes Absolute: 0.4 10*3/uL (ref 0.1–1.0)
Monocytes Relative: 9 %
Neutro Abs: 2.7 10*3/uL (ref 1.7–7.7)
Neutrophils Relative %: 61 %
Platelet Count: 163 10*3/uL (ref 150–400)
RBC: 3.52 MIL/uL — ABNORMAL LOW (ref 3.87–5.11)
RDW: 14.7 % (ref 11.5–15.5)
WBC Count: 4.5 10*3/uL (ref 4.0–10.5)
nRBC: 0 % (ref 0.0–0.2)

## 2023-05-29 LAB — CMP (CANCER CENTER ONLY)
ALT: 15 U/L (ref 0–44)
AST: 22 U/L (ref 15–41)
Albumin: 4.2 g/dL (ref 3.5–5.0)
Alkaline Phosphatase: 49 U/L (ref 38–126)
Anion gap: 11 (ref 5–15)
BUN: 26 mg/dL — ABNORMAL HIGH (ref 8–23)
CO2: 24 mmol/L (ref 22–32)
Calcium: 9.8 mg/dL (ref 8.9–10.3)
Chloride: 102 mmol/L (ref 98–111)
Creatinine: 1.16 mg/dL — ABNORMAL HIGH (ref 0.44–1.00)
GFR, Estimated: 48 mL/min — ABNORMAL LOW (ref >60)
Glucose, Bld: 106 mg/dL — ABNORMAL HIGH (ref 70–99)
Potassium: 4 mmol/L (ref 3.5–5.1)
Sodium: 137 mmol/L (ref 135–145)
Total Bilirubin: 0.6 mg/dL (ref 0.0–1.2)
Total Protein: 7 g/dL (ref 6.5–8.1)

## 2023-05-29 NOTE — Assessment & Plan Note (Addendum)
#   Right breast cancer -[facility]; ER/PR positive Her 2 Negative ;  G-3; LVI + T2N1.  Stage II [OCT 2023; Dr.Byrnett] s/p Lumpectomy; node positive-Oncotype RS- 35- s/p  adjuvant chemotherapy with Taxotere-Cytoxan every 3 weeks x4 cycles.  S/p postlumpectomy radiation April 3rd 2024. # Currently on anastrozole once a day [April 2024]-tolerating well without any major side effects. Poor tolerance to adjuvant abemaciclib 100 mg BID [started second week of June].   #JAN 2025- re-started reduced dose at 50 mg BID.  Labs-CBC/chemistries were reviewed with the patient.  # COPD [Dr.Gonzalez]-on Breztri/albuterol -  Stable.   # [2nd,FEB 2024] CTA-  acute pulmonary embolism, with new segmental to subsegmental embolus present in the right lower lobe/right lower lobe-pulmonary infarct. LEFT LE- Acute DVT-on anticoagulation with eliquis on 5 mg BID X 6 months- [provoked sec to chemo]. Stop eliquis in End of  AUG 2024.    Stable.   # Bone health-2023 osteopenic T-score of -1.1.Continue calcium plus vitamin D.- stable.  I discussed the role of adjuvant Zometa every 6 months x 3 years. S/p dental evaluation - stable.    # Right arm decreased range of motion/swelling- ?  Lymphedema- s/p evaluation- Maureen/OT- stable.   # Vaccination: s/p COVID; Flu; OK with RSV.   # IV Access: PIV   # Debility recent fall-recommend permanent parking permit for mobility.  # Zometa - 05/03/2023.   # DISPOSITION: #  handicap parking permit # follow in 4  weeks- MD;labs- cbc/cmp- Dr.B

## 2023-05-29 NOTE — Progress Notes (Signed)
Larey Seat about 2-3 weeks ago coming out of [employer], no injuries other than a scratched knee.

## 2023-05-29 NOTE — Progress Notes (Signed)
[facility] CONSULT NOTE  Patient Care Team: [name], MD as PCP - General (Family Medicine) [name], MD as PCP - Cardiology (Cardiology) [name], [name], MD as Consulting Physician (General Surgery) [name], MD (Internal Medicine) [name], RN as Oncology Nurse Navigator [name], MD as Consulting Physician (Radiation Oncology) [name], MD as Consulting Physician (Internal Medicine) Sung Amabile, DO as Consulting Physician (General Surgery)  CHIEF COMPLAINTS/PURPOSE OF CONSULTATION: Breast cancer  #  Oncology History Overview Note  # 2009- Sigmoid colon cancer stage II (T3 N0 4 lymph nodes sampled M0 stage II high risk there was obstruction and perforation abscess; Dr.Hern), workup included a low CEA preoperatively and a chest x-ray and CT scan abdomen pelvis negative for metastatic disease, S/P colostomy; s/p FOLFOX x 6 months. [Dr.Gittin ]  # LATER REVERSED , K RAS  wild type B RAF not evaluated   Also initial iron deficiency with anemia thrombocytosis, high platelets considered reactive, workup includes a normal PCR for CML and a negative [id number] mutation  DIAGNOSIS:  A. BREAST, RIGHT; LUMPECTOMY:  - INVASIVE MAMMARY CARCINOMA.  - DUCTAL CARCINOMA IN SITU (DCIS).  - SEE CANCER SUMMARY BELOW.  - BIOPSY SITE CHANGE WITH CLIP (2).  - FIBROUS TISSUE WITH CYST FORMATION AND FLORID DUCTAL HYPERPLASIA.  - SKIN AND NIPPLE NEGATIVE FOR MALIGNANCY.  - VASCULAR CALCIFICATION.   B. SENTINEL LYMPH NODES 1 AND 2, RIGHT AXILLA; EXCISION:  - METASTATIC CARCINOMA INVOLVES ONE OF TWO LYMPH NODES (1/2).   C. NON-SENTINEL LYMPH NODES, RIGHT AXILLA; EXCISION:  - ONE LYMPH NODE NEGATIVE FOR MALIGNANCY (0/1).   CANCER CASE SUMMARY: INVASIVE CARCINOMA OF THE BREAST  Standard(s): AJCC-UICC 8   SPECIMEN  Procedure: Lumpectomy  Specimen Laterality: Right   TUMOR  Histologic Type: Invasive carcinoma of no special type  (ductal)  Histologic Grade (Nottingham Histologic Score)       Glandular (Acinar)/Tubular Differentiation: 3       Nuclear Pleomorphism: 3       Mitotic Rate: 3       Overall Grade: 3  Tumor Size: 24 mm  Tumor Focality: Single focus of invasive carcinoma  Ductal Carcinoma In Situ (DCIS): Present, nuclear grade 2-3  Tumor Extent: Not applicable  Lymphatic and/or Vascular Invasion: Present  Treatment Effect in the Breast: No known presurgical therapy   MARGINS  Margin Status for Invasive Carcinoma: All margins negative for invasive  carcinoma       Distance from closest margin: 10 mm       Specify closest margin: Superior   Margin Status for DCIS: All margins negative for DCIS       Distance from DCIS to closest margin: 10 mm       Specify closest margin: Superior   REGIONAL LYMPH NODES  Regional Lymph Node Status: Tumor present in regional lymph node(s)       Number of Lymph Nodes with Macrometastases (greater than 2 mm): 1       Number of Lymph Nodes with Micrometastases (greater than 0.2 mm to  2 mm and/or greater than 200 cells): 0       Number of Lymph Nodes with Isolated Tumor Cells (0.2 mm or less OR  200 cells or less): 0       Size of Largest Metastatic Deposit: 3 mm      Extranodal Extension: Not identified       Total Number of Lymph Nodes Examined (sentinel and non-sentinel): 3  Number of Sentinel Nodes Examined: 2   DISTANT METASTASIS  Distant Site(s) Involved, if applicable: Not applicable   PATHOLOGIC STAGE CLASSIFICATION (pTNM, AJCC 8th Edition):  Modified Classification: Not applicable  pT Category: pT2  T Suffix: Not applicable  pN Category: pN1a  N Suffix: (sn)  pM Category: Not applicable   SPECIAL STUDIES  Breast Biomarker Testing Performed on Previous Outside Biopsy:  23-250-T11  Per outside report:  Estrogen Receptor (ER) Status: POSITIVE          Percentage of cells with nuclear positivity: Greater than 90%          Average intensity of  staining: Strong   Progesterone Receptor (PgR) Status: POSITIVE          Percentage of cells with nuclear positivity: 60%          Average intensity of staining: Strong   HER2 (by immunohistochemistry): EQUIVOCAL (Score 2+)  HER2 FISH: NEGATIVE   Ki-67: Not performed   # Cytoxan Taxotere x 4 cycles; finished JAN, 2024. [Neutropenic fevers s/p cycle #4;   -FEB 2nd, 2024-right lung PE; left lower LE  DVT- on eliquis.   # FEB 19th-start RT; mpectomy radiation April 3rd 2024.  # anastrozole once a day [April 2024]-t #  Poor tolerance to adjuvant abemaciclib 100 mg BID [started second week of June] stopped x 1 month.    # JAN 8th 2024- start abemaciclib 50 mg BID       Cancer of sigmoid (HCC) (Resolved)  Carcinoma of overlapping sites of right breast in female, estrogen receptor positive (HCC)  02/14/2022 Initial Diagnosis   Carcinoma of overlapping sites of right breast in female, estrogen receptor positive (HCC)   02/14/2022 Cancer Staging   Staging form: Breast, AJCC 8th Edition - Pathologic: Stage IIA (pT2, pN1, cM0, G3, ER+, PR+, HER2-) - Signed by [name], MD on 02/14/2022 Histologic grading system: 3 grade system   02/23/2022 -  Chemotherapy   Patient is on Treatment Plan : BREAST TC q21d      HISTORY OF PRESENTING ILLNESS:   Alone.  Ambulating independently.   Sabrina Wilson 83 y.o.  female patient with ER/PR positive Her 2 Negative  breast cancer stage II T2 N1-currently  adjuvant anastrozole-and verzinio [re-started in [place] 2025] is here for a follow up.  Larey Seat about 2-3 weeks ago coming out of [employer], no injuries other than a scratched knee.    Her leg swelling is improved overall. .  Denies any blood in the black or stools.  Denies any nausea vomiting or diarrhea.  Review of Systems  Constitutional:  Negative for chills, diaphoresis and fever.  HENT:  Negative for nosebleeds and sore throat.   Eyes:  Negative for double vision.   Respiratory:  Negative for hemoptysis, sputum production and wheezing.   Cardiovascular:  Negative for chest pain, palpitations, orthopnea and leg swelling.  Gastrointestinal:  Negative for blood in stool, constipation, diarrhea, heartburn, melena and vomiting.  Genitourinary:  Negative for dysuria, frequency and urgency.  Musculoskeletal:  Positive for back pain and joint pain.  Skin: Negative.  Negative for itching and rash.  Neurological:  Negative for dizziness, tingling, focal weakness, weakness and headaches.  Endo/Heme/Allergies:  Does not bruise/bleed easily.  Psychiatric/Behavioral:  Negative for depression. The patient is not nervous/anxious and does not have insomnia.      MEDICAL HISTORY:  Past Medical History:  Diagnosis Date   Actinic keratosis 02/12/2020   R lat calf (hypertrophic)  Arthritis    Cancer of sigmoid (HCC) 06/17/2016   Colon cancer (HCC) 2009   T3,N0; s/p resection  Dr. Earnestine Leys and chemotherapy by Dr. Neale Burly   Diastolic dysfunction    a. 08/2020 Echo: EF 60-65%, no rwma, Gr1 DD, nl RV size/fxn. Mild MR.   Hernia of flank    History of actinic keratoses 08/11/2020   Bx proven at left lateral ankle proximal, LN2 10/08/20   History of stress test    a. 09/2020 MV: EF >65%. No ischemia/infarct. Mild Ao Ca2+ and minimal Cor Ca2+.   Hypertension    Neuropathy    Personal history of radiation therapy 2023   right breast CA   Polyp at cervical os    s/p resection Dr. Hyacinth Meeker   Skin cancer 01/22/2020   surface of an atypical verrucous squamous proliferation    Squamous cell carcinoma of skin 07/16/2020   Left lateral ankle anterior, treated with [facility] 08-11-2020    SURGICAL HISTORY: Past Surgical History:  Procedure Laterality Date   BREAST BIOPSY Right 05/2014   Dr. Rutherford Nail office-benign   BREAST LUMPECTOMY WITH SENTINEL LYMPH NODE BIOPSY Right 01/21/2022   Procedure: BREAST LUMPECTOMY WITH SENTINEL LYMPH NODE BX;  Surgeon: [name], MD;  Location:  [facility] ORS;  Service: General;  Laterality: Right;   BREAST SURGERY Right February 2016   Vacuum assisted biopsy for recurrent cyst, fibrocystic changes without evidence of malignancy.   CATARACT EXTRACTION W/PHACO Left 01/13/2015   Procedure: CATARACT EXTRACTION PHACO AND INTRAOCULAR LENS PLACEMENT (IOC);  Surgeon: Galen Manila, MD;  Location: [facility] ORS;  Service: Ophthalmology;  Laterality: Left;  [place]  1:02.9 AP   19.2 CDE  12.06 casette lot #  [id number]   CATARACT EXTRACTION W/PHACO Right 02/03/2015   Procedure: CATARACT EXTRACTION PHACO AND INTRAOCULAR LENS PLACEMENT (IOC);  Surgeon: Galen Manila, MD;  Location: [facility] ORS;  Service: Ophthalmology;  Laterality: Right;  us00:53 ap46.5 [id number]   COLECTOMY     COLONOSCOPY  2015   Dr Bluford Kaufmann   COLONOSCOPY WITH PROPOFOL N/A 09/13/2017   Procedure: COLONOSCOPY WITH PROPOFOL;  Surgeon: [name], MD;  [facility];  Service: Endoscopy;  Laterality: N/A;   COLONOSCOPY WITH PROPOFOL N/A 11/08/2017   Procedure: COLONOSCOPY WITH PROPOFOL;  Surgeon: [name], MD;  Location: [facility] ENDOSCOPY;  Service: Endoscopy;  Laterality: N/A;   colostomy reversal     DILATION AND CURETTAGE OF UTERUS  2014   Dr. Hyacinth Meeker, benign per pt   EYE SURGERY     HERNIA REPAIR  07/13/12   ventral    SKIN CANCER EXCISION     TONSILLECTOMY      SOCIAL HISTORY: Social History   Socioeconomic History   Marital status: Married    Spouse name: Not on file   Number of children: Not on file   Years of education: Not on file   Highest education level: Not on file  Occupational History   Not on file  Tobacco Use   Smoking status: Former    Current packs/day: 0.00    Types: Cigarettes    Quit date: 10/12/2007    Years since quitting: 15.6   Smokeless tobacco: Never  Vaping Use   Vaping status: Never Used  Substance and Sexual Activity   Alcohol use: Yes    Alcohol/week: 1.0 standard drink of alcohol    Types: 1 Standard drinks or  equivalent per week    Comment: with dinner GLASS OF WINE EACH DAY   Drug  use: No   Sexual activity: Not on file  Other Topics Concern   Not on file  Social History Narrative   Lives in [place] with husband. 2 children, son lives nearby.Diet - regularExercise - none. 30 mins/in [place]. Quit smoking in 2009. Wine before dinner. Pianist in church; real estate; Diplomatic Services operational officer.    Social Drivers of Corporate investment banker Strain: Low Risk  (11/21/2022)   Overall Financial Resource Strain (CARDIA)    Difficulty of Paying Living Expenses: Not hard at all  Food Insecurity: No Food Insecurity (11/21/2022)   Hunger Vital Sign    Worried About Running Out of Food in the Last Year: Never true    Ran Out of Food in the Last Year: Never true  Transportation Needs: No Transportation Needs (11/21/2022)   PRAPARE - Administrator, Civil Service (Medical): No    Lack of Transportation (Non-Medical): No  Physical Activity: Inactive (11/21/2022)   Exercise Vital Sign    Days of Exercise per Week: 0 days    Minutes of Exercise per Session: 0 min  Stress: No Stress Concern Present (11/21/2022)   [employer] of Occupational Health - Occupational Stress Questionnaire    Feeling of Stress : Not at all  Social Connections: Socially Integrated (11/21/2022)   Social Connection and Isolation Panel [NHANES]    Frequency of Communication with Friends and Family: More than three times a week    Frequency of Social Gatherings with Friends and Family: More than three times a week    Attends Religious Services: More than 4 times per year    Active Member of [employer] or Organizations: Yes    Attends Engineer, structural: More than 4 times per year    Marital Status: Married  Catering manager Violence: Not At Risk (11/21/2022)   Humiliation, Afraid, Rape, and Kick questionnaire    Fear of Current or Ex-Partner: No    Emotionally Abused: No    Physically Abused: No    Sexually Abused: No     FAMILY HISTORY: Family History  Problem Relation Age of Onset   Stroke Mother    Diabetes Mother    Colon cancer Father        dx 7s   Stroke Sister    Colon cancer Brother        dx 72s-70s   Brain cancer Brother        dx 60s-70s   Breast cancer Neg Hx     ALLERGIES:  is allergic to sulfa antibiotics.  MEDICATIONS:  Current Outpatient Medications  Medication Sig Dispense Refill   abemaciclib (VERZENIO) 50 MG tablet Take 1 tablet (50 mg total) by mouth 2 (two) times daily. 56 tablet 3   albuterol (VENTOLIN HFA) 108 (90 Base) MCG/ACT inhaler Inhale 2 puffs into the lungs every 6 (six) hours as needed for wheezing or shortness of breath. 8 g 2   anastrozole (ARIMIDEX) 1 MG tablet TAKE 1 TABLET BY MOUTH DAILY 90 tablet 0   atorvastatin (LIPITOR) 10 MG tablet TAKE 1 TABLET BY MOUTH DAILY 90 tablet 0   Budeson-Glycopyrrol-Formoterol (BREZTRI AEROSPHERE) 160-9-4.8 MCG/ACT AERO Inhale 2 puffs into the lungs in the morning and at bedtime. 1 each 11   Calcium Carbonate-Vit D-Min (CALCIUM 1200 PO) Take 1 tablet by mouth daily.     furosemide (LASIX) 20 MG tablet TAKE 1 TABLET BY MOUTH EVERY OTHER DAY 30 tablet 0   halobetasol (ULTRAVATE) 0.05 % cream Apply topically to aa's  of rash at body twice daily on weekends only as needed. Avoid applying to face, groin, and axilla. Use as directed. 50 g 1   KLOR-CON M20 20 MEQ tablet TAKE 1 TABLET BY MOUTH EVERY OTHER DAY 30 tablet 0   loratadine (CLARITIN) 10 MG tablet Take 10 mg by mouth daily.     Multiple Vitamins-Minerals (PRESERVISION AREDS 2 PO)      niacinamide 500 MG tablet Take 500 mg by mouth 2 (two) times daily.     Omega-3 Fatty Acids (FISH OIL PO) Take 1 capsule by mouth daily.     Spacer/Aero-Holding Chambers (AEROCHAMBER MV) inhaler Use as instructed 1 each 0   tacrolimus (PROTOPIC) 0.1 % ointment Apply topically to affected areas of rash twice daily on week days only prn 60 g 1   VITAMIN D, CHOLECALCIFEROL, PO Take 2,000 Units  by mouth daily.     No current facility-administered medications for this visit.    PHYSICAL EXAMINATION:   Vitals:   05/29/23 1455 05/29/23 1503  BP: (!) 155/83 (!) 166/91  Pulse: 74   Temp: 97.8 F (36.6 C)   SpO2: 100%      Filed Weights   05/29/23 1455  Weight: 171 lb 9.6 oz (77.8 kg)    Physical Exam Vitals and nursing note reviewed.  HENT:     Head: Normocephalic and atraumatic.     Mouth/Throat:     Pharynx: Oropharynx is clear.  Eyes:     Extraocular Movements: Extraocular movements intact.     Pupils: Pupils are equal, round, and reactive to light.  Cardiovascular:     Rate and Rhythm: Normal rate and regular rhythm.     Heart sounds: Murmur heard.  Pulmonary:     Comments: Decreased breath sounds bilaterally.  Abdominal:     Palpations: Abdomen is soft.  Musculoskeletal:        General: Normal range of motion.     Cervical back: Normal range of motion.  Skin:    General: Skin is warm.  Neurological:     General: No focal deficit present.     Mental Status: She is alert and oriented to person, place, and time.  Psychiatric:        Behavior: Behavior normal.        Judgment: Judgment normal.      LABORATORY DATA:  I have reviewed the data as listed Lab Results  Component Value Date   WBC 4.5 05/29/2023   HGB 11.6 (L) 05/29/2023   HCT 35.1 (L) 05/29/2023   MCV 99.7 05/29/2023   PLT 163 05/29/2023   Recent Labs    03/15/23 1505 05/03/23 1500 05/29/23 1435  NA 138 137 137  K 3.7 3.8 4.0  CL 100 101 102  CO2 25 26 24   GLUCOSE 102* 103* 106*  BUN 22 26* 26*  CREATININE 0.95 0.87 1.16*  CALCIUM 10.3 10.2 9.8  GFRNONAA >60 >60 48*  PROT 7.6 6.9 7.0  ALBUMIN 4.2 4.3 4.2  AST 20 24 22   ALT 14 17 15   ALKPHOS 78 49 49  BILITOT 0.3 0.8 0.6    RADIOGRAPHIC STUDIES: I have personally reviewed the radiological images as listed and agreed with the findings in the report. No results found.  ASSESSMENT & PLAN:   Carcinoma of  overlapping sites of right breast in female, estrogen receptor positive (HCC) # Right breast cancer -[facility]; ER/PR positive Her 2 Negative ;  G-3; LVI + T2N1.  Stage II [OCT 2023; Dr.Byrnett]  s/p Lumpectomy; node positive-Oncotype RS- 35- s/p  adjuvant chemotherapy with Taxotere-Cytoxan every 3 weeks x4 cycles.  S/p postlumpectomy radiation April 3rd 2024. # Currently on anastrozole once a day [April 2024]-tolerating well without any major side effects. Poor tolerance to adjuvant abemaciclib 100 mg BID [started second week of June].   #JAN 2025- re-started reduced dose at 50 mg BID.  Labs-CBC/chemistries were reviewed with the patient.  # COPD [Dr.Gonzalez]-on Breztri/albuterol -  Stable.   # [2nd,FEB 2024] CTA-  acute pulmonary embolism, with new segmental to subsegmental embolus present in the right lower lobe/right lower lobe-pulmonary infarct. LEFT LE- Acute DVT-on anticoagulation with eliquis on 5 mg BID X 6 months- [provoked sec to chemo]. Stop eliquis in End of  AUG 2024.    Stable.   # Bone health-2023 osteopenic T-score of -1.1.Continue calcium plus vitamin D.- stable.  I discussed the role of adjuvant Zometa every 6 months x 3 years. S/p dental evaluation - stable.    # Right arm decreased range of motion/swelling- ?  Lymphedema- s/p evaluation- Maureen/OT- stable.   # Vaccination: s/p COVID; Flu; OK with RSV.   # IV Access: PIV   # Debility recent fall-recommend permanent parking permit for mobility.  # Zometa - 05/03/2023.   # DISPOSITION: #  handicap parking permit # follow in 4  weeks- MD;labs- cbc/cmp- Dr.B  All questions were answered. The patient/family knows to call the clinic with any problems, questions or concerns.    [name], MD 05/29/2023 3:33 PM

## 2023-05-31 ENCOUNTER — Ambulatory Visit: Payer: Medicare Other | Admitting: Internal Medicine

## 2023-05-31 ENCOUNTER — Other Ambulatory Visit: Payer: Medicare Other

## 2023-06-01 ENCOUNTER — Encounter: Payer: Self-pay | Admitting: *Deleted

## 2023-06-07 ENCOUNTER — Other Ambulatory Visit: Payer: Medicare Other

## 2023-06-07 ENCOUNTER — Ambulatory Visit: Payer: Medicare Other | Admitting: Internal Medicine

## 2023-06-09 ENCOUNTER — Other Ambulatory Visit: Payer: Self-pay | Admitting: Internal Medicine

## 2023-06-14 ENCOUNTER — Telehealth: Payer: Self-pay | Admitting: *Deleted

## 2023-06-14 ENCOUNTER — Encounter: Payer: Self-pay | Admitting: Internal Medicine

## 2023-06-14 MED ORDER — FUROSEMIDE 20 MG PO TABS: 20.0000 mg | ORAL_TABLET | ORAL | 0 refills | Status: DC

## 2023-06-14 MED ORDER — POTASSIUM CHLORIDE CRYS ER 20 MEQ PO TBCR: 20.0000 meq | EXTENDED_RELEASE_TABLET | ORAL | 0 refills | Status: DC

## 2023-06-14 NOTE — Telephone Encounter (Signed)
Refills sent to pharmacy per request. ?

## 2023-06-19 ENCOUNTER — Other Ambulatory Visit: Payer: Self-pay

## 2023-06-19 NOTE — Progress Notes (Signed)
 Specialty Pharmacy Refill Coordination Note  Sabrina Wilson is a 83 y.o. female contacted today regarding refills of specialty medication(s) Abemaciclib Sabrina Wilson)   Patient requested Delivery   Delivery date: 06/23/23   Verified address: [address] [place]   Medication will be filled on 06/22/23.

## 2023-06-22 ENCOUNTER — Other Ambulatory Visit: Payer: Self-pay

## 2023-06-27 ENCOUNTER — Inpatient Hospital Stay: Payer: Medicare Other | Attending: Internal Medicine

## 2023-06-27 ENCOUNTER — Inpatient Hospital Stay (HOSPITAL_BASED_OUTPATIENT_CLINIC_OR_DEPARTMENT_OTHER): Payer: Medicare Other | Admitting: Internal Medicine

## 2023-06-27 ENCOUNTER — Encounter: Payer: Self-pay | Admitting: Internal Medicine

## 2023-06-27 VITALS — BP 118/80 | HR 89 | Temp 98.2°F | Resp 14 | Wt 170.0 lb

## 2023-06-27 DIAGNOSIS — Z170 Estrogen receptor positive status [ER+]: Secondary | ICD-10-CM

## 2023-06-27 DIAGNOSIS — M8580 Other specified disorders of bone density and structure, unspecified site: Secondary | ICD-10-CM | POA: Insufficient documentation

## 2023-06-27 DIAGNOSIS — Z79811 Long term (current) use of aromatase inhibitors: Secondary | ICD-10-CM | POA: Insufficient documentation

## 2023-06-27 DIAGNOSIS — N183 Chronic kidney disease, stage 3 unspecified: Secondary | ICD-10-CM | POA: Insufficient documentation

## 2023-06-27 DIAGNOSIS — Z1721 Progesterone receptor positive status: Secondary | ICD-10-CM | POA: Diagnosis not present

## 2023-06-27 DIAGNOSIS — Z79899 Other long term (current) drug therapy: Secondary | ICD-10-CM | POA: Diagnosis not present

## 2023-06-27 DIAGNOSIS — Z808 Family history of malignant neoplasm of other organs or systems: Secondary | ICD-10-CM | POA: Diagnosis not present

## 2023-06-27 DIAGNOSIS — Z87891 Personal history of nicotine dependence: Secondary | ICD-10-CM | POA: Diagnosis not present

## 2023-06-27 DIAGNOSIS — J449 Chronic obstructive pulmonary disease, unspecified: Secondary | ICD-10-CM | POA: Insufficient documentation

## 2023-06-27 DIAGNOSIS — Z800 Family history of malignant neoplasm of digestive organs: Secondary | ICD-10-CM | POA: Diagnosis not present

## 2023-06-27 DIAGNOSIS — C50811 Malignant neoplasm of overlapping sites of right female breast: Secondary | ICD-10-CM | POA: Insufficient documentation

## 2023-06-27 DIAGNOSIS — Z1732 Human epidermal growth factor receptor 2 negative status: Secondary | ICD-10-CM | POA: Diagnosis not present

## 2023-06-27 LAB — CBC WITH DIFFERENTIAL (CANCER CENTER ONLY)
Abs Immature Granulocytes: 0.01 10*3/uL (ref 0.00–0.07)
Basophils Absolute: 0.1 10*3/uL (ref 0.0–0.1)
Basophils Relative: 2 %
Eosinophils Absolute: 0.2 10*3/uL (ref 0.0–0.5)
Eosinophils Relative: 4 %
HCT: 31.5 % — ABNORMAL LOW (ref 36.0–46.0)
Hemoglobin: 10.6 g/dL — ABNORMAL LOW (ref 12.0–15.0)
Immature Granulocytes: 0 %
Lymphocytes Relative: 27 %
Lymphs Abs: 1.3 10*3/uL (ref 0.7–4.0)
MCH: 33.8 pg (ref 26.0–34.0)
MCHC: 33.7 g/dL (ref 30.0–36.0)
MCV: 100.3 fL — ABNORMAL HIGH (ref 80.0–100.0)
Monocytes Absolute: 0.5 10*3/uL (ref 0.1–1.0)
Monocytes Relative: 10 %
Neutro Abs: 2.7 10*3/uL (ref 1.7–7.7)
Neutrophils Relative %: 57 %
Platelet Count: 175 10*3/uL (ref 150–400)
RBC: 3.14 MIL/uL — ABNORMAL LOW (ref 3.87–5.11)
RDW: 16.5 % — ABNORMAL HIGH (ref 11.5–15.5)
WBC Count: 4.8 10*3/uL (ref 4.0–10.5)
nRBC: 0 % (ref 0.0–0.2)

## 2023-06-27 LAB — CMP (CANCER CENTER ONLY)
ALT: 16 U/L (ref 0–44)
AST: 24 U/L (ref 15–41)
Albumin: 4.2 g/dL (ref 3.5–5.0)
Alkaline Phosphatase: 43 U/L (ref 38–126)
Anion gap: 10 (ref 5–15)
BUN: 28 mg/dL — ABNORMAL HIGH (ref 8–23)
CO2: 24 mmol/L (ref 22–32)
Calcium: 9.7 mg/dL (ref 8.9–10.3)
Chloride: 104 mmol/L (ref 98–111)
Creatinine: 1.24 mg/dL — ABNORMAL HIGH (ref 0.44–1.00)
GFR, Estimated: 44 mL/min — ABNORMAL LOW (ref >60)
Glucose, Bld: 104 mg/dL — ABNORMAL HIGH (ref 70–99)
Potassium: 3.7 mmol/L (ref 3.5–5.1)
Sodium: 138 mmol/L (ref 135–145)
Total Bilirubin: 0.9 mg/dL (ref 0.0–1.2)
Total Protein: 6.9 g/dL (ref 6.5–8.1)

## 2023-06-27 NOTE — Progress Notes (Signed)
 [facility] CONSULT NOTE  Patient Care Team: [name], MD (Inactive) as PCP - General (Family Medicine) [name], MD as PCP - Cardiology (Cardiology) [name], [name], MD as Consulting Physician (General Surgery) [name], MD (Internal Medicine) [name], RN as Oncology Nurse Navigator [name], MD as Consulting Physician (Radiation Oncology) [name], MD as Consulting Physician (Internal Medicine) Sung Amabile, DO as Consulting Physician (General Surgery)  CHIEF COMPLAINTS/PURPOSE OF CONSULTATION: Breast cancer  #  Oncology History Overview Note  # 2009- Sigmoid colon cancer stage II (T3 N0 4 lymph nodes sampled M0 stage II high risk there was obstruction and perforation abscess; Dr.Hern), workup included a low CEA preoperatively and a chest x-ray and CT scan abdomen pelvis negative for metastatic disease, S/P colostomy; s/p FOLFOX x 6 months. [Dr.Gittin ]  # LATER REVERSED , K RAS  wild type B RAF not evaluated   Also initial iron deficiency with anemia thrombocytosis, high platelets considered reactive, workup includes a normal PCR for CML and a negative [id number] mutation  DIAGNOSIS:  A. BREAST, RIGHT; LUMPECTOMY:  - INVASIVE MAMMARY CARCINOMA.  - DUCTAL CARCINOMA IN SITU (DCIS).  - SEE CANCER SUMMARY BELOW.  - BIOPSY SITE CHANGE WITH CLIP (2).  - FIBROUS TISSUE WITH CYST FORMATION AND FLORID DUCTAL HYPERPLASIA.  - SKIN AND NIPPLE NEGATIVE FOR MALIGNANCY.  - VASCULAR CALCIFICATION.   B. SENTINEL LYMPH NODES 1 AND 2, RIGHT AXILLA; EXCISION:  - METASTATIC CARCINOMA INVOLVES ONE OF TWO LYMPH NODES (1/2).   C. NON-SENTINEL LYMPH NODES, RIGHT AXILLA; EXCISION:  - ONE LYMPH NODE NEGATIVE FOR MALIGNANCY (0/1).   CANCER CASE SUMMARY: INVASIVE CARCINOMA OF THE BREAST  Standard(s): AJCC-UICC 8   SPECIMEN  Procedure: Lumpectomy  Specimen Laterality: Right   TUMOR  Histologic Type: Invasive carcinoma of no  special type (ductal)  Histologic Grade (Nottingham Histologic Score)       Glandular (Acinar)/Tubular Differentiation: 3       Nuclear Pleomorphism: 3       Mitotic Rate: 3       Overall Grade: 3  Tumor Size: 24 mm  Tumor Focality: Single focus of invasive carcinoma  Ductal Carcinoma In Situ (DCIS): Present, nuclear grade 2-3  Tumor Extent: Not applicable  Lymphatic and/or Vascular Invasion: Present  Treatment Effect in the Breast: No known presurgical therapy   MARGINS  Margin Status for Invasive Carcinoma: All margins negative for invasive  carcinoma       Distance from closest margin: 10 mm       Specify closest margin: Superior   Margin Status for DCIS: All margins negative for DCIS       Distance from DCIS to closest margin: 10 mm       Specify closest margin: Superior   REGIONAL LYMPH NODES  Regional Lymph Node Status: Tumor present in regional lymph node(s)       Number of Lymph Nodes with Macrometastases (greater than 2 mm): 1       Number of Lymph Nodes with Micrometastases (greater than 0.2 mm to  2 mm and/or greater than 200 cells): 0       Number of Lymph Nodes with Isolated Tumor Cells (0.2 mm or less OR  200 cells or less): 0       Size of Largest Metastatic Deposit: 3 mm      Extranodal Extension: Not identified       Total Number of Lymph Nodes Examined (sentinel and non-sentinel):  3       Number of Sentinel Nodes Examined: 2   DISTANT METASTASIS  Distant Site(s) Involved, if applicable: Not applicable   PATHOLOGIC STAGE CLASSIFICATION (pTNM, AJCC 8th Edition):  Modified Classification: Not applicable  pT Category: pT2  T Suffix: Not applicable  pN Category: pN1a  N Suffix: (sn)  pM Category: Not applicable   SPECIAL STUDIES  Breast Biomarker Testing Performed on Previous Outside Biopsy:  23-250-T11  Per outside report:  Estrogen Receptor (ER) Status: POSITIVE          Percentage of cells with nuclear positivity: Greater than 90%          Average  intensity of staining: Strong   Progesterone Receptor (PgR) Status: POSITIVE          Percentage of cells with nuclear positivity: 60%          Average intensity of staining: Strong   HER2 (by immunohistochemistry): EQUIVOCAL (Score 2+)  HER2 FISH: NEGATIVE   Ki-67: Not performed   # Cytoxan Taxotere x 4 cycles; finished JAN, 2024. [Neutropenic fevers s/p cycle #4;   -FEB 2nd, 2024-right lung PE; left lower LE  DVT- on eliquis.   # FEB 19th-start RT; mpectomy radiation April 3rd 2024.  # anastrozole once a day [April 2024]-t #  Poor tolerance to adjuvant abemaciclib 100 mg BID [started second week of June] stopped x 1 month.    # JAN 8th 2024- start abemaciclib 50 mg BID       Cancer of sigmoid (HCC) (Resolved)  Carcinoma of overlapping sites of right breast in female, estrogen receptor positive (HCC)  02/14/2022 Initial Diagnosis   Carcinoma of overlapping sites of right breast in female, estrogen receptor positive (HCC)   02/14/2022 Cancer Staging   Staging form: Breast, AJCC 8th Edition - Pathologic: Stage IIA (pT2, pN1, cM0, G3, ER+, PR+, HER2-) - Signed by [name], MD on 02/14/2022 Histologic grading system: 3 grade system   02/23/2022 -  Chemotherapy   Patient is on Treatment Plan : BREAST TC q21d      HISTORY OF PRESENTING ILLNESS:   Alone.  Ambulating independently.   Sabrina Wilson 83 y.o.  female patient with ER/PR positive Her 2 Negative  breast cancer stage II T2 N1-currently  adjuvant anastrozole-and verzinio [re-started in [facility] 2025] is here for a follow up.   Her leg swelling is improved overall.   Denies any blood in the black or stools.  Denies any nausea vomiting or diarrhea.  Review of Systems  Constitutional:  Negative for chills, diaphoresis and fever.  HENT:  Negative for nosebleeds and sore throat.   Eyes:  Negative for double vision.  Respiratory:  Negative for hemoptysis, sputum production and wheezing.   Cardiovascular:   Negative for chest pain, palpitations, orthopnea and leg swelling.  Gastrointestinal:  Negative for blood in stool, constipation, diarrhea, heartburn, melena and vomiting.  Genitourinary:  Negative for dysuria, frequency and urgency.  Musculoskeletal:  Positive for back pain and joint pain.  Skin: Negative.  Negative for itching and rash.  Neurological:  Negative for dizziness, tingling, focal weakness, weakness and headaches.  Endo/Heme/Allergies:  Does not bruise/bleed easily.  Psychiatric/Behavioral:  Negative for depression. The patient is not nervous/anxious and does not have insomnia.      MEDICAL HISTORY:  Past Medical History:  Diagnosis Date   Actinic keratosis 02/12/2020   R lat calf (hypertrophic)   Arthritis    Cancer of sigmoid (HCC) 06/17/2016   Colon  cancer [facility]) 2009   T3,N0; s/p resection  Dr. Earnestine Leys and chemotherapy by Dr. Neale Burly   Diastolic dysfunction    a. 08/2020 Echo: EF 60-65%, no rwma, Gr1 DD, nl RV size/fxn. Mild MR.   Hernia of flank    History of actinic keratoses 08/11/2020   Bx proven at left lateral ankle proximal, LN2 10/08/20   History of stress test    a. 09/2020 MV: EF >65%. No ischemia/infarct. Mild Ao Ca2+ and minimal Cor Ca2+.   Hypertension    Neuropathy    Personal history of radiation therapy 2023   right breast CA   Polyp at cervical os    s/p resection Dr. Hyacinth Meeker   Skin cancer 01/22/2020   surface of an atypical verrucous squamous proliferation    Squamous cell carcinoma of skin 07/16/2020   Left lateral ankle anterior, treated with [facility] 08-11-2020    SURGICAL HISTORY: Past Surgical History:  Procedure Laterality Date   BREAST BIOPSY Right 05/2014   Dr. Rutherford Nail office-benign   BREAST LUMPECTOMY WITH SENTINEL LYMPH NODE BIOPSY Right 01/21/2022   Procedure: BREAST LUMPECTOMY WITH SENTINEL LYMPH NODE BX;  Surgeon: [name], MD;  Location: [facility] ORS;  Service: General;  Laterality: Right;   BREAST SURGERY Right February 2016    Vacuum assisted biopsy for recurrent cyst, fibrocystic changes without evidence of malignancy.   CATARACT EXTRACTION W/PHACO Left 01/13/2015   Procedure: CATARACT EXTRACTION PHACO AND INTRAOCULAR LENS PLACEMENT (IOC);  Surgeon: Galen Manila, MD;  Location: [facility] ORS;  Service: Ophthalmology;  Laterality: Left;  [place]  1:02.9 AP   19.2 CDE  12.06 casette lot #  [id number]   CATARACT EXTRACTION W/PHACO Right 02/03/2015   Procedure: CATARACT EXTRACTION PHACO AND INTRAOCULAR LENS PLACEMENT (IOC);  Surgeon: Galen Manila, MD;  Location: [facility] ORS;  Service: Ophthalmology;  Laterality: Right;  us00:53 ap46.5 [id number]   COLECTOMY     COLONOSCOPY  2015   Dr Bluford Kaufmann   COLONOSCOPY WITH PROPOFOL N/A 09/13/2017   Procedure: COLONOSCOPY WITH PROPOFOL;  Surgeon: [name], MD;  Location: [facility] ENDOSCOPY;  Service: Endoscopy;  Laterality: N/A;   COLONOSCOPY WITH PROPOFOL N/A 11/08/2017   Procedure: COLONOSCOPY WITH PROPOFOL;  Surgeon: [name], MD;  Location: [facility] ENDOSCOPY;  Service: Endoscopy;  Laterality: N/A;   colostomy reversal     DILATION AND CURETTAGE OF UTERUS  2014   Dr. Hyacinth Meeker, benign per pt   EYE SURGERY     HERNIA REPAIR  07/13/12   ventral    SKIN CANCER EXCISION     TONSILLECTOMY      SOCIAL HISTORY: Social History   Socioeconomic History   Marital status: Married    Spouse name: Not on file   Number of children: Not on file   Years of education: Not on file   Highest education level: Not on file  Occupational History   Not on file  Tobacco Use   Smoking status: Former    Current packs/day: 0.00    Types: Cigarettes    Quit date: 10/12/2007    Years since quitting: 15.7   Smokeless tobacco: Never  Vaping Use   Vaping status: Never Used  Substance and Sexual Activity   Alcohol use: Yes    Alcohol/week: 1.0 standard drink of alcohol    Types: 1 Standard drinks or equivalent per week    Comment: with dinner GLASS OF WINE EACH DAY   Drug use: No   Sexual  activity: Not on file  Other Topics  Concern   Not on file  Social History Narrative   Lives in [place] with husband. 2 children, son lives nearby.Diet - regularExercise - none. 30 mins/in [place]. Quit smoking in 2009. Wine before dinner. Pianist in church; real estate; Diplomatic Services operational officer.    Social Drivers of Corporate investment banker Strain: Low Risk  (11/21/2022)   Overall Financial Resource Strain (CARDIA)    Difficulty of Paying Living Expenses: Not hard at all  Food Insecurity: No Food Insecurity (11/21/2022)   Hunger Vital Sign    Worried About Running Out of Food in the Last Year: Never true    Ran Out of Food in the Last Year: Never true  Transportation Needs: No Transportation Needs (11/21/2022)   PRAPARE - Administrator, Civil Service (Medical): No    Lack of Transportation (Non-Medical): No  Physical Activity: Inactive (11/21/2022)   Exercise Vital Sign    Days of Exercise per Week: 0 days    Minutes of Exercise per Session: 0 min  Stress: No Stress Concern Present (11/21/2022)   [employer] of Occupational Health - Occupational Stress Questionnaire    Feeling of Stress : Not at all  Social Connections: Socially Integrated (11/21/2022)   Social Connection and Isolation Panel [NHANES]    Frequency of Communication with Friends and Family: More than three times a week    Frequency of Social Gatherings with Friends and Family: More than three times a week    Attends Religious Services: More than 4 times per year    Active Member of [employer] or Organizations: Yes    Attends Engineer, structural: More than 4 times per year    Marital Status: Married  Catering manager Violence: Not At Risk (11/21/2022)   Humiliation, Afraid, Rape, and Kick questionnaire    Fear of Current or Ex-Partner: No    Emotionally Abused: No    Physically Abused: No    Sexually Abused: No    FAMILY HISTORY: Family History  Problem Relation Age of Onset   Stroke Mother     Diabetes Mother    Colon cancer Father        dx 25s   Stroke Sister    Colon cancer Brother        dx 45s-70s   Brain cancer Brother        dx 60s-70s   Breast cancer Neg Hx     ALLERGIES:  is allergic to sulfa antibiotics.  MEDICATIONS:  Current Outpatient Medications  Medication Sig Dispense Refill   abemaciclib (VERZENIO) 50 MG tablet Take 1 tablet (50 mg total) by mouth 2 (two) times daily. 56 tablet 3   albuterol (VENTOLIN HFA) 108 (90 Base) MCG/ACT inhaler Inhale 2 puffs into the lungs every 6 (six) hours as needed for wheezing or shortness of breath. 8 g 2   anastrozole (ARIMIDEX) 1 MG tablet TAKE 1 TABLET BY MOUTH DAILY 90 tablet 0   atorvastatin (LIPITOR) 10 MG tablet TAKE 1 TABLET BY MOUTH DAILY 90 tablet 0   Budeson-Glycopyrrol-Formoterol (BREZTRI AEROSPHERE) 160-9-4.8 MCG/ACT AERO Inhale 2 puffs into the lungs in the morning and at bedtime. 1 each 11   Calcium Carbonate-Vit D-Min (CALCIUM 1200 PO) Take 1 tablet by mouth daily.     furosemide (LASIX) 20 MG tablet Take 1 tablet (20 mg total) by mouth every other day. 30 tablet 0   halobetasol (ULTRAVATE) 0.05 % cream Apply topically to aa's of rash at body twice daily on weekends only  as needed. Avoid applying to face, groin, and axilla. Use as directed. 50 g 1   loratadine (CLARITIN) 10 MG tablet Take 10 mg by mouth daily.     Multiple Vitamins-Minerals (PRESERVISION AREDS 2 PO)      niacinamide 500 MG tablet Take 500 mg by mouth 2 (two) times daily.     Omega-3 Fatty Acids (FISH OIL PO) Take 1 capsule by mouth daily.     potassium chloride SA (KLOR-CON M20) 20 MEQ tablet Take 1 tablet (20 mEq total) by mouth every other day. 30 tablet 0   Spacer/Aero-Holding Chambers (AEROCHAMBER MV) inhaler Use as instructed 1 each 0   tacrolimus (PROTOPIC) 0.1 % ointment Apply topically to affected areas of rash twice daily on week days only prn 60 g 1   VITAMIN D, CHOLECALCIFEROL, PO Take 2,000 Units by mouth daily.     No current  facility-administered medications for this visit.   PHYSICAL EXAMINATION:   Vitals:   06/27/23 1516  BP: 118/80  Pulse: 89  Resp: 14  Temp: 98.2 F (36.8 C)  SpO2: 97%    Filed Weights   06/27/23 1516  Weight: 170 lb (77.1 kg)    Physical Exam Vitals and nursing note reviewed.  HENT:     Head: Normocephalic and atraumatic.     Mouth/Throat:     Pharynx: Oropharynx is clear.  Eyes:     Extraocular Movements: Extraocular movements intact.     Pupils: Pupils are equal, round, and reactive to light.  Cardiovascular:     Rate and Rhythm: Normal rate and regular rhythm.     Heart sounds: Murmur heard.  Pulmonary:     Comments: Decreased breath sounds bilaterally.  Abdominal:     Palpations: Abdomen is soft.  Musculoskeletal:        General: Normal range of motion.     Cervical back: Normal range of motion.  Skin:    General: Skin is warm.  Neurological:     General: No focal deficit present.     Mental Status: She is alert and oriented to person, place, and time.  Psychiatric:        Behavior: Behavior normal.        Judgment: Judgment normal.      LABORATORY DATA:  I have reviewed the data as listed Lab Results  Component Value Date   WBC 4.8 06/27/2023   HGB 10.6 (L) 06/27/2023   HCT 31.5 (L) 06/27/2023   MCV 100.3 (H) 06/27/2023   PLT 175 06/27/2023   Recent Labs    05/03/23 1500 05/29/23 1435 06/27/23 1505  NA 137 137 138  K 3.8 4.0 3.7  CL 101 102 104  CO2 26 24 24   GLUCOSE 103* 106* 104*  BUN 26* 26* 28*  CREATININE 0.87 1.16* 1.24*  CALCIUM 10.2 9.8 9.7  GFRNONAA >60 48* 44*  PROT 6.9 7.0 6.9  ALBUMIN 4.3 4.2 4.2  AST 24 22 24   ALT 17 15 16   ALKPHOS 49 49 43  BILITOT 0.8 0.6 0.9    RADIOGRAPHIC STUDIES: I have personally reviewed the radiological images as listed and agreed with the findings in the report. No results found.  ASSESSMENT & PLAN:   Carcinoma of overlapping sites of right breast in female, estrogen receptor  positive (HCC) # Right breast cancer -[facility]; ER/PR positive Her 2 Negative ;  G-3; LVI + T2N1.  Stage II [OCT 2023; Dr.Byrnett] s/p Lumpectomy; node positive-Oncotype RS- 35- s/p  adjuvant chemotherapy with  Taxotere-Cytoxan every 3 weeks x4 cycles.  S/p postlumpectomy radiation April 3rd 2024. # Currently on anastrozole once a day [April 2024]-tolerating well without any major side effects. Poor tolerance to adjuvant abemaciclib 100 mg BID [started second week of June].  #JAN 2025- re-started reduced dose at 50 mg BID.  Labs-CBC/chemistries were reviewed with the patient. Stable.   # Hx of colon cancer- 2019- adenoma polyps [Dr.Byrnett]- discussed re: mutual discussion beterween patient/endoscopist. Patient will speak to Dr.Byrnett.   # COPD [Dr.Gonzalez]-on Breztri/albuterol -  Stable.   # [2nd,FEB 2024] CTA-  acute pulmonary embolism, with new segmental to subsegmental embolus present in the right lower lobe/right lower lobe-pulmonary infarct. LEFT LE- Acute DVT-on anticoagulation with eliquis on 5 mg BID X 6 months- [provoked sec to chemo]. Stop eliquis in End of  AUG 2024.    Stable.   # Bone health-2023 osteopenic T-score of -1.1.Continue calcium plus vitamin D.- stable.  I discussed the role of adjuvant Zometa every 6 months x 3 years. S/p dental evaluation - stable.    # CKD- stage III- monitor for now.   # Right arm decreased range of motion/swelling- ?  Lymphedema- s/p evaluation- Maureen/OT- stable.   # Vaccination: s/p COVID; Flu; OK with RSV.   # IV Access: PIV   # Zometa - 05/03/2023.   # DISPOSITION: # follow in 4  weeks- MD;labs- cbc/cmp- Dr.B   All questions were answered. The patient/family knows to call the clinic with any problems, questions or concerns.    [name], MD 06/27/2023 4:25 PM

## 2023-06-27 NOTE — Assessment & Plan Note (Addendum)
#   Right breast cancer -[facility]; ER/PR positive Her 2 Negative ;  G-3; LVI + T2N1.  Stage II [OCT 2023; Dr.Byrnett] s/p Lumpectomy; node positive-Oncotype RS- 35- s/p  adjuvant chemotherapy with Taxotere-Cytoxan every 3 weeks x4 cycles.  S/p postlumpectomy radiation April 3rd 2024. # Currently on anastrozole once a day [April 2024]-tolerating well without any major side effects. Poor tolerance to adjuvant abemaciclib 100 mg BID [started second week of June].  #JAN 2025- re-started reduced dose at 50 mg BID.  Labs-CBC/chemistries were reviewed with the patient. Stable.   # Hx of colon cancer- 2019- adenoma polyps [Dr.Byrnett]- discussed re: mutual discussion beterween patient/endoscopist. Patient will speak to Dr.Byrnett.   # COPD [Dr.Gonzalez]-on Breztri/albuterol -  Stable.   # [2nd,FEB 2024] CTA-  acute pulmonary embolism, with new segmental to subsegmental embolus present in the right lower lobe/right lower lobe-pulmonary infarct. LEFT LE- Acute DVT-on anticoagulation with eliquis on 5 mg BID X 6 months- [provoked sec to chemo]. Stop eliquis in End of  AUG 2024.    Stable.   # Bone health-2023 osteopenic T-score of -1.1.Continue calcium plus vitamin D.- stable.  I discussed the role of adjuvant Zometa every 6 months x 3 years. S/p dental evaluation - stable.    # CKD- stage III- monitor for now.   # Right arm decreased range of motion/swelling- ?  Lymphedema- s/p evaluation- Maureen/OT- stable.   # Vaccination: s/p COVID; Flu; OK with RSV.   # IV Access: PIV   # Zometa - 05/03/2023.   # DISPOSITION: # follow in 4  weeks- MD;labs- cbc/cmp- Dr.B

## 2023-06-27 NOTE — Progress Notes (Signed)
 Patient is doing fine, no new questions or concerns for the doctor today.

## 2023-07-05 ENCOUNTER — Telehealth: Payer: Self-pay

## 2023-07-05 NOTE — Telephone Encounter (Signed)
 I left a voicemail for patient letting her know that Dr. Marikay Alar has left our practice.  I asked patient to please call and let us know if she would like to schedule a transfer of care to one of our providers who are accepting new patients.  When patient calls back, please schedule a transfer of care appointment for her.

## 2023-07-06 ENCOUNTER — Telehealth: Payer: Self-pay

## 2023-07-06 NOTE — Telephone Encounter (Unsigned)
 Copied from CRM [id number]. Topic: Appointments - Appointment Scheduling >> Jul 06, 2023  4:23 PM [place] J wrote: Patient would like to round back up later regarding scheduling and establishing with a new provider. Patient would like a call back from [facility] Monday is possible.

## 2023-07-10 ENCOUNTER — Other Ambulatory Visit: Payer: Self-pay

## 2023-07-10 DIAGNOSIS — E785 Hyperlipidemia, unspecified: Secondary | ICD-10-CM

## 2023-07-10 MED ORDER — ATORVASTATIN CALCIUM 10 MG PO TABS: 10.0000 mg | ORAL_TABLET | Freq: Every day | ORAL | 0 refills | Status: DC

## 2023-07-18 ENCOUNTER — Other Ambulatory Visit: Payer: Self-pay

## 2023-07-20 ENCOUNTER — Other Ambulatory Visit: Payer: Self-pay | Admitting: Pharmacy Technician

## 2023-07-20 ENCOUNTER — Other Ambulatory Visit: Payer: Self-pay

## 2023-07-20 NOTE — Progress Notes (Signed)
 Specialty Pharmacy Refill Coordination Note  Sabrina Wilson is a 83 y.o. female contacted today regarding refills of specialty medication(s) Abemaciclib Kathlen Mody)   Patient requested Delivery   Delivery date: 07/24/23   Verified address: [address]  [place]   Medication will be filled on 07/21/23.

## 2023-07-21 ENCOUNTER — Other Ambulatory Visit: Payer: Self-pay

## 2023-07-25 ENCOUNTER — Other Ambulatory Visit

## 2023-07-25 ENCOUNTER — Ambulatory Visit: Admitting: Internal Medicine

## 2023-07-26 ENCOUNTER — Inpatient Hospital Stay (HOSPITAL_BASED_OUTPATIENT_CLINIC_OR_DEPARTMENT_OTHER): Admitting: Internal Medicine

## 2023-07-26 ENCOUNTER — Encounter: Payer: Self-pay | Admitting: Internal Medicine

## 2023-07-26 ENCOUNTER — Inpatient Hospital Stay: Attending: Internal Medicine

## 2023-07-26 DIAGNOSIS — Z170 Estrogen receptor positive status [ER+]: Secondary | ICD-10-CM | POA: Diagnosis not present

## 2023-07-26 DIAGNOSIS — M8580 Other specified disorders of bone density and structure, unspecified site: Secondary | ICD-10-CM | POA: Insufficient documentation

## 2023-07-26 DIAGNOSIS — Z79899 Other long term (current) drug therapy: Secondary | ICD-10-CM | POA: Insufficient documentation

## 2023-07-26 DIAGNOSIS — Z79811 Long term (current) use of aromatase inhibitors: Secondary | ICD-10-CM | POA: Diagnosis not present

## 2023-07-26 DIAGNOSIS — N183 Chronic kidney disease, stage 3 unspecified: Secondary | ICD-10-CM | POA: Insufficient documentation

## 2023-07-26 DIAGNOSIS — J449 Chronic obstructive pulmonary disease, unspecified: Secondary | ICD-10-CM | POA: Diagnosis not present

## 2023-07-26 DIAGNOSIS — C50811 Malignant neoplasm of overlapping sites of right female breast: Secondary | ICD-10-CM | POA: Insufficient documentation

## 2023-07-26 DIAGNOSIS — Z87891 Personal history of nicotine dependence: Secondary | ICD-10-CM | POA: Diagnosis not present

## 2023-07-26 DIAGNOSIS — I129 Hypertensive chronic kidney disease with stage 1 through stage 4 chronic kidney disease, or unspecified chronic kidney disease: Secondary | ICD-10-CM | POA: Diagnosis not present

## 2023-07-26 LAB — CBC WITH DIFFERENTIAL (CANCER CENTER ONLY)
Abs Immature Granulocytes: 0.01 10*3/uL (ref 0.00–0.07)
Basophils Absolute: 0.1 10*3/uL (ref 0.0–0.1)
Basophils Relative: 2 %
Eosinophils Absolute: 0.2 10*3/uL (ref 0.0–0.5)
Eosinophils Relative: 4 %
HCT: 33.8 % — ABNORMAL LOW (ref 36.0–46.0)
Hemoglobin: 11.2 g/dL — ABNORMAL LOW (ref 12.0–15.0)
Immature Granulocytes: 0 %
Lymphocytes Relative: 26 %
Lymphs Abs: 1.3 10*3/uL (ref 0.7–4.0)
MCH: 34.9 pg — ABNORMAL HIGH (ref 26.0–34.0)
MCHC: 33.1 g/dL (ref 30.0–36.0)
MCV: 105.3 fL — ABNORMAL HIGH (ref 80.0–100.0)
Monocytes Absolute: 0.5 10*3/uL (ref 0.1–1.0)
Monocytes Relative: 10 %
Neutro Abs: 2.8 10*3/uL (ref 1.7–7.7)
Neutrophils Relative %: 58 %
Platelet Count: 195 10*3/uL (ref 150–400)
RBC: 3.21 MIL/uL — ABNORMAL LOW (ref 3.87–5.11)
RDW: 17 % — ABNORMAL HIGH (ref 11.5–15.5)
WBC Count: 4.8 10*3/uL (ref 4.0–10.5)
nRBC: 0 % (ref 0.0–0.2)

## 2023-07-26 LAB — CMP (CANCER CENTER ONLY)
ALT: 15 U/L (ref 0–44)
AST: 21 U/L (ref 15–41)
Albumin: 4.5 g/dL (ref 3.5–5.0)
Alkaline Phosphatase: 45 U/L (ref 38–126)
Anion gap: 10 (ref 5–15)
BUN: 28 mg/dL — ABNORMAL HIGH (ref 8–23)
CO2: 24 mmol/L (ref 22–32)
Calcium: 10.4 mg/dL — ABNORMAL HIGH (ref 8.9–10.3)
Chloride: 101 mmol/L (ref 98–111)
Creatinine: 1.41 mg/dL — ABNORMAL HIGH (ref 0.44–1.00)
GFR, Estimated: 38 mL/min — ABNORMAL LOW (ref >60)
Glucose, Bld: 99 mg/dL (ref 70–99)
Potassium: 4 mmol/L (ref 3.5–5.1)
Sodium: 135 mmol/L (ref 135–145)
Total Bilirubin: 0.8 mg/dL (ref 0.0–1.2)
Total Protein: 7.3 g/dL (ref 6.5–8.1)

## 2023-07-26 NOTE — Progress Notes (Signed)
 [facility] CONSULT NOTE  Patient Care Team: [name], MD (Inactive) as PCP - General (Family Medicine) [name], MD as PCP - Cardiology (Cardiology) [name], [name], MD as Consulting Physician (General Surgery) [name], MD (Internal Medicine) [name], RN as Oncology Nurse Navigator [name], MD as Consulting Physician (Radiation Oncology) [name], MD as Consulting Physician (Internal Medicine) Sung Amabile, DO as Consulting Physician (General Surgery)  CHIEF COMPLAINTS/PURPOSE OF CONSULTATION: Breast cancer  #  Oncology History Overview Note  # 2009- Sigmoid colon cancer stage II (T3 N0 4 lymph nodes sampled M0 stage II high risk there was obstruction and perforation abscess; Dr.Hern), workup included a low CEA preoperatively and a chest x-ray and CT scan abdomen pelvis negative for metastatic disease, S/P colostomy; s/p FOLFOX x 6 months. [Dr.Gittin ]  # LATER REVERSED , K RAS  wild type B RAF not evaluated   Also initial iron deficiency with anemia thrombocytosis, high platelets considered reactive, workup includes a normal PCR for CML and a negative [id number] mutation  DIAGNOSIS:  A. BREAST, RIGHT; LUMPECTOMY:  - INVASIVE MAMMARY CARCINOMA.  - DUCTAL CARCINOMA IN SITU (DCIS).  - SEE CANCER SUMMARY BELOW.  - BIOPSY SITE CHANGE WITH CLIP (2).  - FIBROUS TISSUE WITH CYST FORMATION AND FLORID DUCTAL HYPERPLASIA.  - SKIN AND NIPPLE NEGATIVE FOR MALIGNANCY.  - VASCULAR CALCIFICATION.   B. SENTINEL LYMPH NODES 1 AND 2, RIGHT AXILLA; EXCISION:  - METASTATIC CARCINOMA INVOLVES ONE OF TWO LYMPH NODES (1/2).   C. NON-SENTINEL LYMPH NODES, RIGHT AXILLA; EXCISION:  - ONE LYMPH NODE NEGATIVE FOR MALIGNANCY (0/1).   CANCER CASE SUMMARY: INVASIVE CARCINOMA OF THE BREAST  Standard(s): AJCC-UICC 8   SPECIMEN  Procedure: Lumpectomy  Specimen Laterality: Right   TUMOR  Histologic Type: Invasive carcinoma of no  special type (ductal)  Histologic Grade (Nottingham Histologic Score)       Glandular (Acinar)/Tubular Differentiation: 3       Nuclear Pleomorphism: 3       Mitotic Rate: 3       Overall Grade: 3  Tumor Size: 24 mm  Tumor Focality: Single focus of invasive carcinoma  Ductal Carcinoma In Situ (DCIS): Present, nuclear grade 2-3  Tumor Extent: Not applicable  Lymphatic and/or Vascular Invasion: Present  Treatment Effect in the Breast: No known presurgical therapy   MARGINS  Margin Status for Invasive Carcinoma: All margins negative for invasive  carcinoma       Distance from closest margin: 10 mm       Specify closest margin: Superior   Margin Status for DCIS: All margins negative for DCIS       Distance from DCIS to closest margin: 10 mm       Specify closest margin: Superior   REGIONAL LYMPH NODES  Regional Lymph Node Status: Tumor present in regional lymph node(s)       Number of Lymph Nodes with Macrometastases (greater than 2 mm): 1       Number of Lymph Nodes with Micrometastases (greater than 0.2 mm to  2 mm and/or greater than 200 cells): 0       Number of Lymph Nodes with Isolated Tumor Cells (0.2 mm or less OR  200 cells or less): 0       Size of Largest Metastatic Deposit: 3 mm      Extranodal Extension: Not identified       Total Number of Lymph Nodes Examined (sentinel and non-sentinel):  3       Number of Sentinel Nodes Examined: 2   DISTANT METASTASIS  Distant Site(s) Involved, if applicable: Not applicable   PATHOLOGIC STAGE CLASSIFICATION (pTNM, AJCC 8th Edition):  Modified Classification: Not applicable  pT Category: pT2  T Suffix: Not applicable  pN Category: pN1a  N Suffix: (sn)  pM Category: Not applicable   SPECIAL STUDIES  Breast Biomarker Testing Performed on Previous Outside Biopsy:  23-250-T11  Per outside report:  Estrogen Receptor (ER) Status: POSITIVE          Percentage of cells with nuclear positivity: Greater than 90%          Average  intensity of staining: Strong   Progesterone Receptor (PgR) Status: POSITIVE          Percentage of cells with nuclear positivity: 60%          Average intensity of staining: Strong   HER2 (by immunohistochemistry): EQUIVOCAL (Score 2+)  HER2 FISH: NEGATIVE   Ki-67: Not performed   # Cytoxan Taxotere x 4 cycles; finished JAN, 2024. [Neutropenic fevers s/p cycle #4;   -FEB 2nd, 2024-right lung PE; left lower LE  DVT- on eliquis.   # FEB 19th-start RT; mpectomy radiation April 3rd 2024.  # anastrozole once a day [April 2024]-t #  Poor tolerance to adjuvant abemaciclib 100 mg BID [started second week of June] stopped x 1 month.    # JAN 8th 2024- start abemaciclib 50 mg BID       Cancer of sigmoid (HCC) (Resolved)  Carcinoma of overlapping sites of right breast in female, estrogen receptor positive (HCC)  02/14/2022 Initial Diagnosis   Carcinoma of overlapping sites of right breast in female, estrogen receptor positive (HCC)   02/14/2022 Cancer Staging   Staging form: Breast, AJCC 8th Edition - Pathologic: Stage IIA (pT2, pN1, cM0, G3, ER+, PR+, HER2-) - Signed by [name], MD on 02/14/2022 Histologic grading system: 3 grade system   02/23/2022 -  Chemotherapy   Patient is on Treatment Plan : BREAST TC q21d      HISTORY OF PRESENTING ILLNESS:   Alone.  Ambulating independently.   Sabrina Wilson 83 y.o.  female patient with ER/PR positive Her 2 Negative  breast cancer stage II T2 N1-currently  adjuvant anastrozole-and verzinio [re-started in [place] 2025] is here for a follow up.   Doing well on lower dosage of abemaciclib. Energy is low but not as bad as it was. Bowels are okay. Neuropathy in fingertips. Still able to play the piano. Denies pain. Appetite is normal.  Denies any nausea vomiting or diarrhea.  Review of Systems  Constitutional:  Negative for chills, diaphoresis and fever.  HENT:  Negative for nosebleeds and sore throat.   Eyes:  Negative for double  vision.  Respiratory:  Negative for hemoptysis, sputum production and wheezing.   Cardiovascular:  Negative for chest pain, palpitations, orthopnea and leg swelling.  Gastrointestinal:  Negative for blood in stool, constipation, diarrhea, heartburn, melena and vomiting.  Genitourinary:  Negative for dysuria, frequency and urgency.  Musculoskeletal:  Positive for back pain and joint pain.  Skin: Negative.  Negative for itching and rash.  Neurological:  Negative for dizziness, tingling, focal weakness, weakness and headaches.  Endo/Heme/Allergies:  Does not bruise/bleed easily.  Psychiatric/Behavioral:  Negative for depression. The patient is not nervous/anxious and does not have insomnia.      MEDICAL HISTORY:  Past Medical History:  Diagnosis Date   Actinic keratosis 02/12/2020  R lat calf (hypertrophic)   Arthritis    Cancer of sigmoid (HCC) 06/17/2016   Colon cancer (HCC) 2009   T3,N0; s/p resection  Dr. Earnestine Leys and chemotherapy by Dr. Neale Burly   Diastolic dysfunction    a. 08/2020 Echo: EF 60-65%, no rwma, Gr1 DD, nl RV size/fxn. Mild MR.   Hernia of flank    History of actinic keratoses 08/11/2020   Bx proven at left lateral ankle proximal, LN2 10/08/20   History of stress test    a. 09/2020 MV: EF >65%. No ischemia/infarct. Mild Ao Ca2+ and minimal Cor Ca2+.   Hypertension    Neuropathy    Personal history of radiation therapy 2023   right breast CA   Polyp at cervical os    s/p resection Dr. Hyacinth Meeker   Skin cancer 01/22/2020   surface of an atypical verrucous squamous proliferation    Squamous cell carcinoma of skin 07/16/2020   Left lateral ankle anterior, treated with [facility] 08-11-2020    SURGICAL HISTORY: Past Surgical History:  Procedure Laterality Date   BREAST BIOPSY Right 05/2014   Dr. Rutherford Nail office-benign   BREAST LUMPECTOMY WITH SENTINEL LYMPH NODE BIOPSY Right 01/21/2022   Procedure: BREAST LUMPECTOMY WITH SENTINEL LYMPH NODE BX;  Surgeon: [name], MD;   Location: [facility] ORS;  Service: General;  Laterality: Right;   BREAST SURGERY Right February 2016   Vacuum assisted biopsy for recurrent cyst, fibrocystic changes without evidence of malignancy.   CATARACT EXTRACTION W/PHACO Left 01/13/2015   Procedure: CATARACT EXTRACTION PHACO AND INTRAOCULAR LENS PLACEMENT (IOC);  Surgeon: Galen Manila, MD;  Location: [facility] ORS;  Service: Ophthalmology;  Laterality: Left;  [place]  1:02.9 AP   19.2 CDE  12.06 casette lot #  [id number]   CATARACT EXTRACTION W/PHACO Right 02/03/2015   Procedure: CATARACT EXTRACTION PHACO AND INTRAOCULAR LENS PLACEMENT (IOC);  Surgeon: Galen Manila, MD;  Location: [facility] ORS;  Service: Ophthalmology;  Laterality: Right;  us00:53 ap46.5 [id number]   COLECTOMY     COLONOSCOPY  2015   Dr Bluford Kaufmann   COLONOSCOPY WITH PROPOFOL N/A 09/13/2017   Procedure: COLONOSCOPY WITH PROPOFOL;  Surgeon: [name], MD;  Location: [facility] ENDOSCOPY;  Service: Endoscopy;  Laterality: N/A;   COLONOSCOPY WITH PROPOFOL N/A 11/08/2017   Procedure: COLONOSCOPY WITH PROPOFOL;  Surgeon: [name], MD;  Location: [facility] ENDOSCOPY;  Service: Endoscopy;  Laterality: N/A;   colostomy reversal     DILATION AND CURETTAGE OF UTERUS  2014   Dr. Hyacinth Meeker, benign per pt   EYE SURGERY     HERNIA REPAIR  07/13/12   ventral    SKIN CANCER EXCISION     TONSILLECTOMY      SOCIAL HISTORY: Social History   Socioeconomic History   Marital status: Married    Spouse name: Not on file   Number of children: Not on file   Years of education: Not on file   Highest education level: Not on file  Occupational History   Not on file  Tobacco Use   Smoking status: Former    Current packs/day: 0.00    Types: Cigarettes    Quit date: 10/12/2007    Years since quitting: 15.7   Smokeless tobacco: Never  Vaping Use   Vaping status: Never Used  Substance and Sexual Activity   Alcohol use: Yes    Alcohol/week: 1.0 standard drink of alcohol    Types: 1 Standard drinks  or equivalent per week    Comment: with dinner GLASS OF  WINE EACH DAY   Drug use: No   Sexual activity: Not on file  Other Topics Concern   Not on file  Social History Narrative   Lives in [place] with husband. 2 children, son lives nearby.Diet - regularExercise - none. 30 mins/in [place]. Quit smoking in 2009. Wine before dinner. Pianist in church; real estate; Diplomatic Services operational officer.    Social Drivers of Corporate investment banker Strain: Low Risk  (11/21/2022)   Overall Financial Resource Strain (CARDIA)    Difficulty of Paying Living Expenses: Not hard at all  Food Insecurity: No Food Insecurity (11/21/2022)   Hunger Vital Sign    Worried About Running Out of Food in the Last Year: Never true    Ran Out of Food in the Last Year: Never true  Transportation Needs: No Transportation Needs (11/21/2022)   PRAPARE - Administrator, Civil Service (Medical): No    Lack of Transportation (Non-Medical): No  Physical Activity: Inactive (11/21/2022)   Exercise Vital Sign    Days of Exercise per Week: 0 days    Minutes of Exercise per Session: 0 min  Stress: No Stress Concern Present (11/21/2022)   [employer] of Occupational Health - Occupational Stress Questionnaire    Feeling of Stress : Not at all  Social Connections: Socially Integrated (11/21/2022)   Social Connection and Isolation Panel [NHANES]    Frequency of Communication with Friends and Family: More than three times a week    Frequency of Social Gatherings with Friends and Family: More than three times a week    Attends Religious Services: More than 4 times per year    Active Member of [employer] or Organizations: Yes    Attends Engineer, structural: More than 4 times per year    Marital Status: Married  Catering manager Violence: Not At Risk (11/21/2022)   Humiliation, Afraid, Rape, and Kick questionnaire    Fear of Current or Ex-Partner: No    Emotionally Abused: No    Physically Abused: No    Sexually Abused:  No    FAMILY HISTORY: Family History  Problem Relation Age of Onset   Stroke Mother    Diabetes Mother    Colon cancer Father        dx 63s   Stroke Sister    Colon cancer Brother        dx 75s-70s   Brain cancer Brother        dx 60s-70s   Breast cancer Neg Hx     ALLERGIES:  is allergic to sulfa antibiotics.  MEDICATIONS:  Current Outpatient Medications  Medication Sig Dispense Refill   abemaciclib (VERZENIO) 50 MG tablet Take 1 tablet (50 mg total) by mouth 2 (two) times daily. 56 tablet 3   anastrozole (ARIMIDEX) 1 MG tablet TAKE 1 TABLET BY MOUTH DAILY 90 tablet 0   atorvastatin (LIPITOR) 10 MG tablet Take 1 tablet (10 mg total) by mouth daily. 30 tablet 0   Budeson-Glycopyrrol-Formoterol (BREZTRI AEROSPHERE) 160-9-4.8 MCG/ACT AERO Inhale 2 puffs into the lungs in the morning and at bedtime. 1 each 11   Calcium Carbonate-Vit D-Min (CALCIUM 1200 PO) Take 1 tablet by mouth daily.     furosemide (LASIX) 20 MG tablet Take 1 tablet (20 mg total) by mouth every other day. 30 tablet 0   halobetasol (ULTRAVATE) 0.05 % cream Apply topically to aa's of rash at body twice daily on weekends only as needed. Avoid applying to face, groin, and axilla. Use  as directed. 50 g 1   loratadine (CLARITIN) 10 MG tablet Take 10 mg by mouth daily.     Multiple Vitamins-Minerals (PRESERVISION AREDS 2 PO)      niacinamide 500 MG tablet Take 500 mg by mouth 2 (two) times daily.     Omega-3 Fatty Acids (FISH OIL PO) Take 1 capsule by mouth daily.     potassium chloride SA (KLOR-CON M20) 20 MEQ tablet Take 1 tablet (20 mEq total) by mouth every other day. 30 tablet 0   Spacer/Aero-Holding Chambers (AEROCHAMBER MV) inhaler Use as instructed 1 each 0   tacrolimus (PROTOPIC) 0.1 % ointment Apply topically to affected areas of rash twice daily on week days only prn 60 g 1   VITAMIN D, CHOLECALCIFEROL, PO Take 2,000 Units by mouth daily.     albuterol (VENTOLIN HFA) 108 (90 Base) MCG/ACT inhaler Inhale 2  puffs into the lungs every 6 (six) hours as needed for wheezing or shortness of breath. (Patient not taking: Reported on 07/26/2023) 8 g 2   No current facility-administered medications for this visit.   PHYSICAL EXAMINATION:   Vitals:   07/26/23 1434  BP: (!) 157/84  Pulse: 80  Resp: 17  Temp: (!) 97 F (36.1 C)  SpO2: 100%    Filed Weights   07/26/23 1434  Weight: 173 lb 3.2 oz (78.6 kg)    Physical Exam Vitals and nursing note reviewed.  HENT:     Head: Normocephalic and atraumatic.     Mouth/Throat:     Pharynx: Oropharynx is clear.  Eyes:     Extraocular Movements: Extraocular movements intact.     Pupils: Pupils are equal, round, and reactive to light.  Cardiovascular:     Rate and Rhythm: Normal rate and regular rhythm.     Heart sounds: Murmur heard.  Pulmonary:     Comments: Decreased breath sounds bilaterally.  Abdominal:     Palpations: Abdomen is soft.  Musculoskeletal:        General: Normal range of motion.     Cervical back: Normal range of motion.  Skin:    General: Skin is warm.  Neurological:     General: No focal deficit present.     Mental Status: She is alert and oriented to person, place, and time.  Psychiatric:        Behavior: Behavior normal.        Judgment: Judgment normal.      LABORATORY DATA:  I have reviewed the data as listed Lab Results  Component Value Date   WBC 4.8 07/26/2023   HGB 11.2 (L) 07/26/2023   HCT 33.8 (L) 07/26/2023   MCV 105.3 (H) 07/26/2023   PLT 195 07/26/2023   Recent Labs    05/29/23 1435 06/27/23 1505 07/26/23 1424  NA 137 138 135  K 4.0 3.7 4.0  CL 102 104 101  CO2 24 24 24   GLUCOSE 106* 104* 99  BUN 26* 28* 28*  CREATININE 1.16* 1.24* 1.41*  CALCIUM 9.8 9.7 10.4*  GFRNONAA 48* 44* 38*  PROT 7.0 6.9 7.3  ALBUMIN 4.2 4.2 4.5  AST 22 24 21   ALT 15 16 15   ALKPHOS 49 43 45  BILITOT 0.6 0.9 0.8    RADIOGRAPHIC STUDIES: I have personally reviewed the radiological images as listed and  agreed with the findings in the report. No results found.  ASSESSMENT & PLAN:   Carcinoma of overlapping sites of right breast in female, estrogen receptor positive (HCC) # Right  breast cancer -[facility]; ER/PR positive Her 2 Negative ;  G-3; LVI + T2N1.  Stage II [OCT 2023; Dr.Byrnett] s/p Lumpectomy; node positive-Oncotype RS- 35- s/p  adjuvant chemotherapy with Taxotere-Cytoxan every 3 weeks x4 cycles.  S/p postlumpectomy radiation April 3rd 2024. Poor tolerance to adjuvant abemaciclib 100 mg BID [started second week of June].  # Currently on anastrozole once a day [April 2024]-tolerating well without any major side effects.   # JAN 2025- re-started reduced dose at 50 mg BID.  Labs-CBC/chemistries were reviewed with the patient. Stable.   # COPD [Dr.Gonzalez]-on Breztri/albuterol -  Stable.   # [2nd,FEB 2024] CTA-  acute pulmonary embolism/ LEFT LE- Acute DVT-- [provoked sec to chemo]. Stop eliquis in End of  AUG 2024.    Stable.   # Bone health-2023 osteopenic T-score of -1.1.Continue calcium plus vitamin D.- stable.  I discussed the role of adjuvant Zometa every 6 months x 3 years. S/p dental evaluation - stable. Last zometa in JAN 8th, 2025-    # CKD- stage III- monitor for now./Calcium- 10. 4- HOLD vit D and Calcium- will recheck vit d 25-OH levels. Check PTH levels.   # Right arm decreased range of motion/swelling- ?  Lymphedema- s/p evaluation- Maureen/OT- stable.   # Vaccination: s/p COVID; Flu; OK with RSV.   # IV Access: PIV   # Zometa - 05/03/2023.   PTH-added # DISPOSITION: # follow in 4  weeks- MD;labs- cbc/cmp; vit d 25-OH levels.- Dr.B    All questions were answered. The patient/family knows to call the clinic with any problems, questions or concerns.    [name], MD 07/26/2023 3:25 PM

## 2023-07-26 NOTE — Assessment & Plan Note (Addendum)
#   Right breast cancer -[facility]; ER/PR positive Her 2 Negative ;  G-3; LVI + T2N1.  Stage II [OCT 2023; Dr.Byrnett] s/p Lumpectomy; node positive-Oncotype RS- 35- s/p  adjuvant chemotherapy with Taxotere-Cytoxan every 3 weeks x4 cycles.  S/p postlumpectomy radiation April 3rd 2024. Poor tolerance to adjuvant abemaciclib 100 mg BID [started second week of June].  # Currently on anastrozole once a day [April 2024]-tolerating well without any major side effects.   # JAN 2025- re-started reduced dose at 50 mg BID.  Labs-CBC/chemistries were reviewed with the patient. Stable.   # COPD [Dr.Gonzalez]-on Breztri/albuterol -  Stable.   # [2nd,FEB 2024] CTA-  acute pulmonary embolism/ LEFT LE- Acute DVT-- [provoked sec to chemo]. Stop eliquis in End of  AUG 2024.    Stable.   # Bone health-2023 osteopenic T-score of -1.1.Continue calcium plus vitamin D.- stable.  I discussed the role of adjuvant Zometa every 6 months x 3 years. S/p dental evaluation - stable. Last zometa in JAN 8th, 2025-    # CKD- stage III- monitor for now./Calcium- 10. 4- HOLD vit D and Calcium- will recheck vit d 25-OH levels. Check PTH levels.   # Right arm decreased range of motion/swelling- ?  Lymphedema- s/p evaluation- Maureen/OT- stable.   # Vaccination: s/p COVID; Flu; OK with RSV.   # IV Access: PIV   # Zometa - 05/03/2023.   PTH-added # DISPOSITION: # follow in 4  weeks- MD;labs- cbc/cmp; vit d 25-OH levels.- Dr.B

## 2023-07-26 NOTE — Patient Instructions (Signed)
#   Stop taking vitamin D  # Stop taking calcium until next visit.

## 2023-07-26 NOTE — Progress Notes (Signed)
 Doing well on lower dosage of abemaciclib. Energy is low but not as bad as it was. Bowels are okay. Neuropathy in fingertips. Still able to play the piano. Denies pain. Appetite is normal.

## 2023-07-28 ENCOUNTER — Other Ambulatory Visit: Payer: Self-pay | Admitting: Internal Medicine

## 2023-08-06 ENCOUNTER — Other Ambulatory Visit: Payer: Self-pay | Admitting: Internal Medicine

## 2023-08-06 DIAGNOSIS — E785 Hyperlipidemia, unspecified: Secondary | ICD-10-CM

## 2023-08-10 DIAGNOSIS — H57813 Brow ptosis, bilateral: Secondary | ICD-10-CM | POA: Diagnosis not present

## 2023-08-11 ENCOUNTER — Other Ambulatory Visit: Payer: Self-pay | Admitting: Internal Medicine

## 2023-08-15 ENCOUNTER — Other Ambulatory Visit: Payer: Self-pay

## 2023-08-15 ENCOUNTER — Other Ambulatory Visit (HOSPITAL_COMMUNITY): Payer: Self-pay

## 2023-08-16 ENCOUNTER — Other Ambulatory Visit: Payer: Self-pay

## 2023-08-16 ENCOUNTER — Other Ambulatory Visit: Payer: Self-pay | Admitting: Internal Medicine

## 2023-08-16 ENCOUNTER — Other Ambulatory Visit (HOSPITAL_COMMUNITY): Payer: Self-pay

## 2023-08-16 DIAGNOSIS — Z170 Estrogen receptor positive status [ER+]: Secondary | ICD-10-CM

## 2023-08-16 NOTE — Progress Notes (Signed)
 Specialty Pharmacy Refill Coordination Note  Sabrina Wilson is a 83 y.o. female contacted today regarding refills of specialty medication(s) Verzenio .  Patient requested (Patient-Rptd) Delivery   Delivery date: (Patient-Rptd) 08/21/23   Verified address: (Patient-Rptd) [address] [place]  [address]   Medication will be filled on 08/18/23.   This fill date is pending response to refill request from provider. Patient is aware and if they have not received fill by intended date, they must follow up with pharmacy.

## 2023-08-17 ENCOUNTER — Other Ambulatory Visit: Payer: Self-pay

## 2023-08-17 ENCOUNTER — Other Ambulatory Visit (HOSPITAL_COMMUNITY): Payer: Self-pay

## 2023-08-17 ENCOUNTER — Encounter: Payer: Self-pay | Admitting: Internal Medicine

## 2023-08-17 MED ORDER — ABEMACICLIB 50 MG PO TABS
50.0000 mg | ORAL_TABLET | Freq: Two times a day (BID) | ORAL | 3 refills | Status: DC
  Filled 2023-08-17: qty 56, 28d supply, fill #0
  Filled 2023-09-12: qty 56, 28d supply, fill #1
  Filled 2023-10-16: qty 56, 28d supply, fill #2
  Filled 2023-11-10: qty 56, 28d supply, fill #3

## 2023-08-18 ENCOUNTER — Other Ambulatory Visit: Payer: Self-pay

## 2023-08-23 ENCOUNTER — Inpatient Hospital Stay: Admitting: Internal Medicine

## 2023-08-23 ENCOUNTER — Encounter: Payer: Self-pay | Admitting: Internal Medicine

## 2023-08-23 ENCOUNTER — Inpatient Hospital Stay

## 2023-08-23 VITALS — BP 149/79 | HR 74 | Temp 97.7°F | Resp 16 | Ht 66.0 in | Wt 174.8 lb

## 2023-08-23 DIAGNOSIS — C50811 Malignant neoplasm of overlapping sites of right female breast: Secondary | ICD-10-CM

## 2023-08-23 DIAGNOSIS — Z79899 Other long term (current) drug therapy: Secondary | ICD-10-CM | POA: Diagnosis not present

## 2023-08-23 DIAGNOSIS — J449 Chronic obstructive pulmonary disease, unspecified: Secondary | ICD-10-CM | POA: Diagnosis not present

## 2023-08-23 DIAGNOSIS — Z87891 Personal history of nicotine dependence: Secondary | ICD-10-CM | POA: Diagnosis not present

## 2023-08-23 DIAGNOSIS — M8580 Other specified disorders of bone density and structure, unspecified site: Secondary | ICD-10-CM | POA: Diagnosis not present

## 2023-08-23 DIAGNOSIS — Z170 Estrogen receptor positive status [ER+]: Secondary | ICD-10-CM

## 2023-08-23 DIAGNOSIS — I129 Hypertensive chronic kidney disease with stage 1 through stage 4 chronic kidney disease, or unspecified chronic kidney disease: Secondary | ICD-10-CM | POA: Diagnosis not present

## 2023-08-23 DIAGNOSIS — Z79811 Long term (current) use of aromatase inhibitors: Secondary | ICD-10-CM | POA: Diagnosis not present

## 2023-08-23 DIAGNOSIS — N183 Chronic kidney disease, stage 3 unspecified: Secondary | ICD-10-CM | POA: Diagnosis not present

## 2023-08-23 LAB — CBC WITH DIFFERENTIAL (CANCER CENTER ONLY)
Abs Immature Granulocytes: 0.02 10*3/uL (ref 0.00–0.07)
Basophils Absolute: 0.1 10*3/uL (ref 0.0–0.1)
Basophils Relative: 1 %
Eosinophils Absolute: 0.1 10*3/uL (ref 0.0–0.5)
Eosinophils Relative: 3 %
HCT: 32.7 % — ABNORMAL LOW (ref 36.0–46.0)
Hemoglobin: 11.1 g/dL — ABNORMAL LOW (ref 12.0–15.0)
Immature Granulocytes: 0 %
Lymphocytes Relative: 22 %
Lymphs Abs: 1.1 10*3/uL (ref 0.7–4.0)
MCH: 36.4 pg — ABNORMAL HIGH (ref 26.0–34.0)
MCHC: 33.9 g/dL (ref 30.0–36.0)
MCV: 107.2 fL — ABNORMAL HIGH (ref 80.0–100.0)
Monocytes Absolute: 0.5 10*3/uL (ref 0.1–1.0)
Monocytes Relative: 10 %
Neutro Abs: 3.1 10*3/uL (ref 1.7–7.7)
Neutrophils Relative %: 64 %
Platelet Count: 190 10*3/uL (ref 150–400)
RBC: 3.05 MIL/uL — ABNORMAL LOW (ref 3.87–5.11)
RDW: 15.3 % (ref 11.5–15.5)
WBC Count: 4.9 10*3/uL (ref 4.0–10.5)
nRBC: 0 % (ref 0.0–0.2)

## 2023-08-23 LAB — CMP (CANCER CENTER ONLY)
ALT: 15 U/L (ref 0–44)
AST: 22 U/L (ref 15–41)
Albumin: 4.3 g/dL (ref 3.5–5.0)
Alkaline Phosphatase: 39 U/L (ref 38–126)
Anion gap: 10 (ref 5–15)
BUN: 27 mg/dL — ABNORMAL HIGH (ref 8–23)
CO2: 24 mmol/L (ref 22–32)
Calcium: 9.3 mg/dL (ref 8.9–10.3)
Chloride: 102 mmol/L (ref 98–111)
Creatinine: 1.45 mg/dL — ABNORMAL HIGH (ref 0.44–1.00)
GFR, Estimated: 36 mL/min — ABNORMAL LOW (ref >60)
Glucose, Bld: 110 mg/dL — ABNORMAL HIGH (ref 70–99)
Potassium: 4.2 mmol/L (ref 3.5–5.1)
Sodium: 136 mmol/L (ref 135–145)
Total Bilirubin: 0.6 mg/dL (ref 0.0–1.2)
Total Protein: 6.8 g/dL (ref 6.5–8.1)

## 2023-08-23 LAB — VITAMIN D 25 HYDROXY (VIT D DEFICIENCY, FRACTURES): Vit D, 25-Hydroxy: 45.96 ng/mL (ref 30–100)

## 2023-08-23 NOTE — Progress Notes (Signed)
 [facility] CONSULT NOTE  Patient Care Team: Pcp, No as PCP - General [name], MD as PCP - Cardiology (Cardiology) Byrnett, Magali Schmitz, MD as Consulting Physician (General Surgery) Elyn Han, MD (Internal Medicine) Waverly Hageman, RN as Oncology Nurse Navigator Glenis Langdon, MD as Consulting Physician (Radiation Oncology) [name], MD as Consulting Physician (Internal Medicine) Conrado Delay, DO as Consulting Physician (General Surgery)  CHIEF COMPLAINTS/PURPOSE OF CONSULTATION: Breast cancer  #  Oncology History Overview Note  # 2009- Sigmoid colon cancer stage II (T3 N0 4 lymph nodes sampled M0 stage II high risk there was obstruction and perforation abscess; Dr.Hern), workup included a low CEA preoperatively and a chest x-ray and CT scan abdomen pelvis negative for metastatic disease, S/P colostomy; s/p FOLFOX x 6 months. [Dr.Gittin ]  # LATER REVERSED , K RAS  wild type B RAF not evaluated   Also initial iron deficiency with anemia thrombocytosis, high platelets considered reactive, workup includes a normal PCR for CML and a negative [id number] mutation  DIAGNOSIS:  A. BREAST, RIGHT; LUMPECTOMY:  - INVASIVE MAMMARY CARCINOMA.  - DUCTAL CARCINOMA IN SITU (DCIS).  - SEE CANCER SUMMARY BELOW.  - BIOPSY SITE CHANGE WITH CLIP (2).  - FIBROUS TISSUE WITH CYST FORMATION AND FLORID DUCTAL HYPERPLASIA.  - SKIN AND NIPPLE NEGATIVE FOR MALIGNANCY.  - VASCULAR CALCIFICATION.   B. SENTINEL LYMPH NODES 1 AND 2, RIGHT AXILLA; EXCISION:  - METASTATIC CARCINOMA INVOLVES ONE OF TWO LYMPH NODES (1/2).   C. NON-SENTINEL LYMPH NODES, RIGHT AXILLA; EXCISION:  - ONE LYMPH NODE NEGATIVE FOR MALIGNANCY (0/1).   CANCER CASE SUMMARY: INVASIVE CARCINOMA OF THE BREAST  Standard(s): AJCC-UICC 8   SPECIMEN  Procedure: Lumpectomy  Specimen Laterality: Right   TUMOR  Histologic Type: Invasive carcinoma of no special type (ductal)  Histologic Grade  (Nottingham Histologic Score)       Glandular (Acinar)/Tubular Differentiation: 3       Nuclear Pleomorphism: 3       Mitotic Rate: 3       Overall Grade: 3  Tumor Size: 24 mm  Tumor Focality: Single focus of invasive carcinoma  Ductal Carcinoma In Situ (DCIS): Present, nuclear grade 2-3  Tumor Extent: Not applicable  Lymphatic and/or Vascular Invasion: Present  Treatment Effect in the Breast: No known presurgical therapy   MARGINS  Margin Status for Invasive Carcinoma: All margins negative for invasive  carcinoma       Distance from closest margin: 10 mm       Specify closest margin: Superior   Margin Status for DCIS: All margins negative for DCIS       Distance from DCIS to closest margin: 10 mm       Specify closest margin: Superior   REGIONAL LYMPH NODES  Regional Lymph Node Status: Tumor present in regional lymph node(s)       Number of Lymph Nodes with Macrometastases (greater than 2 mm): 1       Number of Lymph Nodes with Micrometastases (greater than 0.2 mm to  2 mm and/or greater than 200 cells): 0       Number of Lymph Nodes with Isolated Tumor Cells (0.2 mm or less OR  200 cells or less): 0       Size of Largest Metastatic Deposit: 3 mm      Extranodal Extension: Not identified       Total Number of Lymph Nodes Examined (sentinel and non-sentinel): 3  Number of Sentinel Nodes Examined: 2   DISTANT METASTASIS  Distant Site(s) Involved, if applicable: Not applicable   PATHOLOGIC STAGE CLASSIFICATION (pTNM, AJCC 8th Edition):  Modified Classification: Not applicable  pT Category: pT2  T Suffix: Not applicable  pN Category: pN1a  N Suffix: (sn)  pM Category: Not applicable   SPECIAL STUDIES  Breast Biomarker Testing Performed on Previous Outside Biopsy:  23-250-T11  Per outside report:  Estrogen Receptor (ER) Status: POSITIVE          Percentage of cells with nuclear positivity: Greater than 90%          Average intensity of staining: Strong    Progesterone Receptor (PgR) Status: POSITIVE          Percentage of cells with nuclear positivity: 60%          Average intensity of staining: Strong   HER2 (by immunohistochemistry): EQUIVOCAL (Score 2+)  HER2 FISH: NEGATIVE   Ki-67: Not performed   # Cytoxan  Taxotere  x 4 cycles; finished JAN, 2024. [Neutropenic fevers s/p cycle #4;   -FEB 2nd, 2024-right lung PE; left lower LE  DVT- on eliquis .   # FEB 19th-start RT; mpectomy radiation April 3rd 2024.  # anastrozole  once a day [April 2024]-t #  Poor tolerance to adjuvant abemaciclib  100 mg BID [started second week of June] stopped x 1 month.    # JAN 8th 2024- start abemaciclib  50 mg BID       Cancer of sigmoid (HCC) (Resolved)  Carcinoma of overlapping sites of right breast in female, estrogen receptor positive (HCC)  02/14/2022 Initial Diagnosis   Carcinoma of overlapping sites of right breast in female, estrogen receptor positive (HCC)   02/14/2022 Cancer Staging   Staging form: Breast, AJCC 8th Edition - Pathologic: Stage IIA (pT2, pN1, cM0, G3, ER+, PR+, HER2-) - Signed by [name], MD on 02/14/2022 Histologic grading system: 3 grade system   02/23/2022 -  Chemotherapy   Patient is on Treatment Plan : BREAST TC q21d      HISTORY OF PRESENTING ILLNESS:   Alone.  Ambulating independently.   Sabrina Wilson 83 y.o.  female patient with ER/PR positive Her 2 Negative  breast cancer stage II T2 N1-currently  adjuvant anastrozole -and verzinio [re-started in JAN 2025] is here for a follow up.   Doing well on lower dosage of abemaciclib . Energy is low but not as bad as it was. Diarrhea 3-4 times a month. No anti-diarrheal.   Neuropathy in fingertips. Still able to play the piano. Denies pain. Appetite is normal.  Denies any nausea vomiting.  Review of Systems  Constitutional:  Negative for chills, diaphoresis and fever.  HENT:  Negative for nosebleeds and sore throat.   Eyes:  Negative for double vision.   Respiratory:  Negative for hemoptysis, sputum production and wheezing.   Cardiovascular:  Negative for chest pain, palpitations, orthopnea and leg swelling.  Gastrointestinal:  Negative for blood in stool, constipation, diarrhea, heartburn, melena and vomiting.  Genitourinary:  Negative for dysuria, frequency and urgency.  Musculoskeletal:  Positive for back pain and joint pain.  Skin: Negative.  Negative for itching and rash.  Neurological:  Negative for dizziness, tingling, focal weakness, weakness and headaches.  Endo/Heme/Allergies:  Does not bruise/bleed easily.  Psychiatric/Behavioral:  Negative for depression. The patient is not nervous/anxious and does not have insomnia.      MEDICAL HISTORY:  Past Medical History:  Diagnosis Date   Actinic keratosis 02/12/2020   R lat calf (  hypertrophic)   Arthritis    Cancer of sigmoid (HCC) 06/17/2016   Colon cancer (HCC) 2009   T3,N0; s/p resection  Dr. Jerre Moots and chemotherapy by Dr. Sabrina Crandall   Diastolic dysfunction    a. 08/2020 Echo: EF 60-65%, no rwma, Gr1 DD, nl RV size/fxn. Mild MR.   Hernia of flank    History of actinic keratoses 08/11/2020   Bx proven at left lateral ankle proximal, LN2 10/08/20   History of stress test    a. 09/2020 MV: EF >65%. No ischemia/infarct. Mild Ao Ca2+ and minimal Cor Ca2+.   Hypertension    Neuropathy    Personal history of radiation therapy 2023   right breast CA   Polyp at cervical os    s/p resection Dr. Annabell Key   Skin cancer 01/22/2020   surface of an atypical verrucous squamous proliferation    Squamous cell carcinoma of skin 07/16/2020   Left lateral ankle anterior, treated with [facility] 08-11-2020    SURGICAL HISTORY: Past Surgical History:  Procedure Laterality Date   BREAST BIOPSY Right 05/2014   Dr. Butch Cashing office-benign   BREAST LUMPECTOMY WITH SENTINEL LYMPH NODE BIOPSY Right 01/21/2022   Procedure: BREAST LUMPECTOMY WITH SENTINEL LYMPH NODE BX;  Surgeon: Marshall Skeeter, MD;  Location:  [facility] ORS;  Service: General;  Laterality: Right;   BREAST SURGERY Right February 2016   Vacuum assisted biopsy for recurrent cyst, fibrocystic changes without evidence of malignancy.   CATARACT EXTRACTION W/PHACO Left 01/13/2015   Procedure: CATARACT EXTRACTION PHACO AND INTRAOCULAR LENS PLACEMENT (IOC);  Surgeon: Clair Crews, MD;  Location: [facility] ORS;  Service: Ophthalmology;  Laterality: Left;  US   1:02.9 AP   19.2 CDE  12.06 casette lot #  [id number]   CATARACT EXTRACTION W/PHACO Right 02/03/2015   Procedure: CATARACT EXTRACTION PHACO AND INTRAOCULAR LENS PLACEMENT (IOC);  Surgeon: Clair Crews, MD;  Location: [facility] ORS;  Service: Ophthalmology;  Laterality: Right;  us00:53 ap46.5 [id number]   COLECTOMY     COLONOSCOPY  2015   Dr Janine Melbourne   COLONOSCOPY WITH PROPOFOL  N/A 09/13/2017   Procedure: COLONOSCOPY WITH PROPOFOL ;  Surgeon: Marshall Skeeter, MD;  Location: [facility] ENDOSCOPY;  Service: Endoscopy;  Laterality: N/A;   COLONOSCOPY WITH PROPOFOL  N/A 11/08/2017   Procedure: COLONOSCOPY WITH PROPOFOL ;  Surgeon: Marshall Skeeter, MD;  Location: [facility] ENDOSCOPY;  Service: Endoscopy;  Laterality: N/A;   colostomy reversal     DILATION AND CURETTAGE OF UTERUS  2014   Dr. Annabell Key, benign per pt   EYE SURGERY     HERNIA REPAIR  07/13/12   ventral    SKIN CANCER EXCISION     TONSILLECTOMY      SOCIAL HISTORY: Social History   Socioeconomic History   Marital status: Married    Spouse name: Not on file   Number of children: Not on file   Years of education: Not on file   Highest education level: Not on file  Occupational History   Not on file  Tobacco Use   Smoking status: Former    Current packs/day: 0.00    Types: Cigarettes    Quit date: 10/12/2007    Years since quitting: 15.8   Smokeless tobacco: Never  Vaping Use   Vaping status: Never Used  Substance and Sexual Activity   Alcohol use: Yes    Alcohol/week: 1.0 standard drink of alcohol    Types: 1 Standard drinks or  equivalent per week    Comment: with dinner GLASS OF WINE EACH DAY  Drug use: No   Sexual activity: Not on file  Other Topics Concern   Not on file  Social History Narrative   Lives in [place] with husband. 2 children, son lives nearby.Diet - regularExercise - none. 30 mins/in [place]. Quit smoking in 2009. Wine before dinner. Pianist in church; real estate; Diplomatic Services operational officer.    Social Drivers of Corporate investment banker Strain: Low Risk  (11/21/2022)   Overall Financial Resource Strain (CARDIA)    Difficulty of Paying Living Expenses: Not hard at all  Food Insecurity: No Food Insecurity (11/21/2022)   Hunger Vital Sign    Worried About Running Out of Food in the Last Year: Never true    Ran Out of Food in the Last Year: Never true  Transportation Needs: No Transportation Needs (11/21/2022)   PRAPARE - Administrator, Civil Service (Medical): No    Lack of Transportation (Non-Medical): No  Physical Activity: Inactive (11/21/2022)   Exercise Vital Sign    Days of Exercise per Week: 0 days    Minutes of Exercise per Session: 0 min  Stress: No Stress Concern Present (11/21/2022)   [employer] of Occupational Health - Occupational Stress Questionnaire    Feeling of Stress : Not at all  Social Connections: Socially Integrated (11/21/2022)   Social Connection and Isolation Panel [NHANES]    Frequency of Communication with Friends and Family: More than three times a week    Frequency of Social Gatherings with Friends and Family: More than three times a week    Attends Religious Services: More than 4 times per year    Active Member of [employer] or Organizations: Yes    Attends Engineer, structural: More than 4 times per year    Marital Status: Married  Catering manager Violence: Not At Risk (11/21/2022)   Humiliation, Afraid, Rape, and Kick questionnaire    Fear of Current or Ex-Partner: No    Emotionally Abused: No    Physically Abused: No    Sexually Abused: No     FAMILY HISTORY: Family History  Problem Relation Age of Onset   Stroke Mother    Diabetes Mother    Colon cancer Father        dx 61s   Stroke Sister    Colon cancer Brother        dx 1s-70s   Brain cancer Brother        dx 60s-70s   Breast cancer Neg Hx     ALLERGIES:  is allergic to sulfa antibiotics.  MEDICATIONS:  Current Outpatient Medications  Medication Sig Dispense Refill   abemaciclib  (VERZENIO ) 50 MG tablet Take 1 tablet (50 mg total) by mouth 2 (two) times daily. 56 tablet 3   anastrozole  (ARIMIDEX ) 1 MG tablet TAKE 1 TABLET BY MOUTH DAILY 90 tablet 0   atorvastatin  (LIPITOR) 10 MG tablet TAKE 1 TABLET BY MOUTH DAILY 30 tablet 1   Budeson-Glycopyrrol-Formoterol (BREZTRI  AEROSPHERE) 160-9-4.8 MCG/ACT AERO Inhale 2 puffs into the lungs in the morning and at bedtime. 1 each 11   furosemide  (LASIX ) 20 MG tablet TAKE 1 TABLET BY MOUTH EVERY OTHER DAY 30 tablet 0   halobetasol  (ULTRAVATE ) 0.05 % cream Apply topically to aa's of rash at body twice daily on weekends only as needed. Avoid applying to face, groin, and axilla. Use as directed. 50 g 1   KLOR-CON  M20 20 MEQ tablet TAKE 1 TABLET BY MOUTH EVERY OTHER DAY 30 tablet 0   loratadine (CLARITIN)  10 MG tablet Take 10 mg by mouth daily.     Multiple Vitamins-Minerals (PRESERVISION AREDS 2 PO)      niacinamide 500 MG tablet Take 500 mg by mouth 2 (two) times daily.     Omega-3 Fatty Acids (FISH OIL PO) Take 1 capsule by mouth daily.     Spacer/Aero-Holding Chambers (AEROCHAMBER MV) inhaler Use as instructed 1 each 0   tacrolimus  (PROTOPIC ) 0.1 % ointment Apply topically to affected areas of rash twice daily on week days only prn 60 g 1   albuterol  (VENTOLIN  HFA) 108 (90 Base) MCG/ACT inhaler Inhale 2 puffs into the lungs every 6 (six) hours as needed for wheezing or shortness of breath. (Patient not taking: Reported on 08/23/2023) 8 g 2   Calcium  Carbonate-Vit D-Min (CALCIUM  1200 PO) Take 1 tablet by mouth daily. (Patient  not taking: Reported on 08/23/2023)     VITAMIN D , CHOLECALCIFEROL, PO Take 2,000 Units by mouth daily. (Patient not taking: Reported on 08/23/2023)     No current facility-administered medications for this visit.   PHYSICAL EXAMINATION:   Vitals:   08/23/23 1307  BP: (!) 149/79  Pulse: 74  Resp: 16  Temp: 97.7 F (36.5 C)  SpO2: 100%    Filed Weights   08/23/23 1307  Weight: 174 lb 12.8 oz (79.3 kg)    Physical Exam Vitals and nursing note reviewed.  HENT:     Head: Normocephalic and atraumatic.     Mouth/Throat:     Pharynx: Oropharynx is clear.  Eyes:     Extraocular Movements: Extraocular movements intact.     Pupils: Pupils are equal, round, and reactive to light.  Cardiovascular:     Rate and Rhythm: Normal rate and regular rhythm.     Heart sounds: Murmur heard.  Pulmonary:     Comments: Decreased breath sounds bilaterally.  Abdominal:     Palpations: Abdomen is soft.  Musculoskeletal:        General: Normal range of motion.     Cervical back: Normal range of motion.  Skin:    General: Skin is warm.  Neurological:     General: No focal deficit present.     Mental Status: She is alert and oriented to person, place, and time.  Psychiatric:        Behavior: Behavior normal.        Judgment: Judgment normal.      LABORATORY DATA:  I have reviewed the data as listed Lab Results  Component Value Date   WBC 4.9 08/23/2023   HGB 11.1 (L) 08/23/2023   HCT 32.7 (L) 08/23/2023   MCV 107.2 (H) 08/23/2023   PLT 190 08/23/2023   Recent Labs    06/27/23 1505 07/26/23 1424 08/23/23 1318  NA 138 135 136  K 3.7 4.0 4.2  CL 104 101 102  CO2 24 24 24   GLUCOSE 104* 99 110*  BUN 28* 28* 27*  CREATININE 1.24* 1.41* 1.45*  CALCIUM  9.7 10.4* 9.3  GFRNONAA 44* 38* 36*  PROT 6.9 7.3 6.8  ALBUMIN 4.2 4.5 4.3  AST 24 21 22   ALT 16 15 15   ALKPHOS 43 45 39  BILITOT 0.9 0.8 0.6    RADIOGRAPHIC STUDIES: I have personally reviewed the radiological images as  listed and agreed with the findings in the report. No results found.  ASSESSMENT & PLAN:   Carcinoma of overlapping sites of right breast in female, estrogen receptor positive (HCC) # Right breast cancer -[facility]; ER/PR positive Her  2 Negative ;  G-3; LVI + T2N1.  Stage II [OCT 2023; Dr.Byrnett] s/p Lumpectomy; node positive-Oncotype RS- 35- s/p  adjuvant chemotherapy with Taxotere -Cytoxan  every 3 weeks x4 cycles.  S/p postlumpectomy radiation April 3rd 2024. Poor tolerance to adjuvant abemaciclib  100 mg BID [started second week of June].  # Currently on anastrozole  once a day [April 2024]; AND in JAN 2025- re-started reduced dose at 50 mg BID.  Labs-CBC/chemistries were reviewed with the patient. Stable.   # COPD [Dr.Gonzalez]-on Breztri /albuterol  -  Stable.   # [2nd,FEB 2024] CTA-  acute pulmonary embolism/ LEFT LE- Acute DVT-- [provoked sec to chemo]. Stop eliquis  in End of  AUG 2024.    Stable.   # Bone health-2023 osteopenic T-score of -1.1.Continue calcium  plus vitamin D .- stable.  I discussed the role of adjuvant Zometa  every 6 months x 3 years. S/p dental evaluation - stable. Last zometa  in JAN 8th, 2025-    # CKD- stage III- monitor for now./ APRIL 2025- CA 10.4; ca+vitD on HOLD-  recheck vit d 25-OH levels; PTH  # Right arm decreased range of motion/swelling- ?  Lymphedema- s/p evaluation- Maureen/OT- stable.   # Vaccination: s/p COVID; Flu; OK with RSV.   # IV Access: PIV   # Zometa  - 05/03/2023 q.   # DISPOSITION: # follow in 5  weeks- MD;labs- cbc/cmp .- Dr.B    All questions were answered. The patient/family knows to call the clinic with any problems, questions or concerns.    [name], MD 08/23/2023 2:18 PM

## 2023-08-23 NOTE — Progress Notes (Signed)
 No concerns today

## 2023-08-23 NOTE — Assessment & Plan Note (Addendum)
#   Right breast cancer -[facility]; ER/PR positive Her 2 Negative ;  G-3; LVI + T2N1.  Stage II [OCT 2023; Dr.Byrnett] s/p Lumpectomy; node positive-Oncotype RS- 35- s/p  adjuvant chemotherapy with Taxotere -Cytoxan  every 3 weeks x4 cycles.  S/p postlumpectomy radiation April 3rd 2024. Poor tolerance to adjuvant abemaciclib  100 mg BID [started second week of June].  # Currently on anastrozole  once a day [April 2024]; AND in JAN 2025- re-started reduced dose at 50 mg BID.  Labs-CBC/chemistries were reviewed with the patient. Stable.   # COPD [Dr.Gonzalez]-on Breztri /albuterol  -  Stable.   # [2nd,FEB 2024] CTA-  acute pulmonary embolism/ LEFT LE- Acute DVT-- [provoked sec to chemo]. Stop eliquis  in End of  AUG 2024.    Stable.   # Bone health-2023 osteopenic T-score of -1.1.Continue calcium  plus vitamin D .- stable.  I discussed the role of adjuvant Zometa  every 6 months x 3 years. S/p dental evaluation - stable. Last zometa  in JAN 8th, 2025-    # CKD- stage III- monitor for now./ APRIL 2025- CA 10.4; ca+vitD on HOLD-  recheck vit d 25-OH levels; PTH  # Right arm decreased range of motion/swelling- ?  Lymphedema- s/p evaluation- Maureen/OT- stable.   # Vaccination: s/p COVID; Flu; OK with RSV.   # IV Access: PIV   # Zometa  - 05/03/2023 q.   # DISPOSITION: # follow in 5  weeks- MD;labs- cbc/cmp .- Dr.B

## 2023-08-24 LAB — PARATHYROID HORMONE, INTACT (NO CA): PTH: 61 pg/mL (ref 15–65)

## 2023-08-25 NOTE — Progress Notes (Unsigned)
 Cardiology Clinic Note   Date: 08/28/2023 ID: Molene Mulloy, DOB 1940-12-14, MRN [id number]  Primary Cardiologist:  [name], MD  Chief Complaint   Jerrilee Dimodica is a 83 y.o. female who presents to the clinic today for routine follow up.   Patient Profile   Lauraetta Orey is followed by Dr. Junnie Olives for the history outlined below.      Past medical history significant for: Dyspnea. Echo 02/15/2023: EF 55 to 60%.  No RWMA.  Grade I DD.  Normal RV size/function.  Normal PA pressure.  Mild MR/AI. Hypertension. Hyperlipidemia. Lipid panel 05/24/2021: LDL 77, HDL 111, TG 65, total 201. PE/DVT. COPD. Breast cancer. Colon cancer.  In summary, patient was first evaluated by Dr. Junnie Olives on 09/14/2020 for DOE.  Patient reported a 67-month history of worsening shortness of breath with exertion.  Echo demonstrated normal LV/RV function, Grade I DD.  Nuclear stress testing was normal low risk study.  Patient was last seen in the office by Dr. Junnie Olives on 02/03/2023 for shortness of breath.  She reported undergoing chemo for breast cancer 3 months prior.  She was started on Verzenio  for maintenance and experienced significant fatigue and shortness of breath.  Verzenio  was held with improvement in symptoms.  She underwent repeat echo which was essentially unchanged from prior echo in 2022 as detailed above.     History of Present Illness    Today, patient reports she is doing well. She was restarted on Verzenio  by oncology at a lower dose and is tolerating it well. Patient denies shortness of breath, lower extremity edema, orthopnea or PND. She reports dyspnea with longer distance walking but none with routine activities. No chest pain, pressure, or tightness. No palpitations.  She checks her BP at home and it typically runs in the 120s. She does not exercise regularly secondary to neuropathy in bilateral feet.     ROS: All other systems reviewed and are otherwise  negative except as noted in History of Present Illness.  EKGs/Labs Reviewed        08/23/2023: ALT 15; AST 22; BUN 27; Creatinine 1.45; Potassium 4.2; Sodium 136   08/23/2023: Hemoglobin 11.1; WBC Count 4.9   05/03/2023: B Natriuretic Peptide 230.1    Physical Exam    VS:  BP 136/80 (BP Location: Left Arm, Patient Position: Sitting, Cuff Size: Normal)   Pulse 73   Ht 5\' 6"  (1.676 m)   Wt 175 lb (79.4 kg)   SpO2 97%   BMI 28.25 kg/m  , BMI Body mass index is 28.25 kg/m.  GEN: Well nourished, well developed, in no acute distress. Neck: No JVD or carotid bruits. Cardiac:  RRR. 2/6 systolic murmur. No rubs or gallops.   Respiratory:  Respirations regular and unlabored. Clear to auscultation without rales, wheezing or rhonchi. GI: Soft, nontender, nondistended. Extremities: Radials/DP/PT 2+ and equal bilaterally. No clubbing or cyanosis. No edema.  Skin: Warm and dry, no rash. Neuro: Strength intact.  Assessment & Plan   Dyspnea/lower extremity edema.  Echo October 2024 showed normal LV/RV function, normal PA pressure, grade 1 DD, mild MR/AI.  Patient reports dyspnea with longer distance walking but none with routine activities. She does not follow a regular exercise routine secondary to neuropathy. Discussed trying out chair exercises.  - Increase physical activity with chair exercising as tolerated.  - Continue Lasix .  Hypertension BP today 142/66 on intake and 136/80 on my recheck. Home SBP typically in the 120s. No report of headaches or  dizziness.  - Continue to monitor at home.   Hyperlipidemia LDL 77 January 2023, at goal. - Continue atorvastatin . - She reports getting set up with new PCP with upcoming visit in the next couple of months. She will have her lipid panel drawn at that time.   Disposition:  Return in 1 year or sooner as needed.          Signed, Lonell Rives. Tequlia Gonsalves, DNP, NP-C

## 2023-08-28 ENCOUNTER — Ambulatory Visit: Attending: Student | Admitting: Student

## 2023-08-28 ENCOUNTER — Encounter: Payer: Self-pay | Admitting: Student

## 2023-08-28 VITALS — BP 136/80 | HR 73 | Ht 66.0 in | Wt 175.0 lb

## 2023-08-28 DIAGNOSIS — R0609 Other forms of dyspnea: Secondary | ICD-10-CM | POA: Diagnosis not present

## 2023-08-28 DIAGNOSIS — I10 Essential (primary) hypertension: Secondary | ICD-10-CM

## 2023-08-28 DIAGNOSIS — R600 Localized edema: Secondary | ICD-10-CM | POA: Diagnosis not present

## 2023-08-28 DIAGNOSIS — E7800 Pure hypercholesterolemia, unspecified: Secondary | ICD-10-CM

## 2023-08-28 NOTE — Patient Instructions (Signed)
 Medication Instructions:  Your Physician recommend you continue on your current medication as directed.    *If you need a refill on your cardiac medications before your next appointment, please call your pharmacy*   Follow-Up: At [facility], you and your health needs are our priority.  As part of our continuing mission to provide you with exceptional heart care, our providers are all part of one team.  This team includes your primary Cardiologist (physician) and Advanced Practice Providers or APPs (Physician Assistants and Nurse Practitioners) who all work together to provide you with the care you need, when you need it.  Your next appointment:   1 year(s)  Provider:   You may see [name], MD or one of the following Advanced Practice Providers on your designated Care Team:   Laneta Pintos, NP Gildardo Labrador, PA-C Varney Gentleman, PA-C Cadence Homer C Jones, PA-C Ronald Cockayne, NP Morey Ar, NP    We recommend signing up for the patient portal called "MyChart".  Sign up information is provided on this After Visit Summary.  MyChart is used to connect with patients for Virtual Visits (Telemedicine).  Patients are able to view lab/test results, encounter notes, upcoming appointments, etc.  Non-urgent messages can be sent to your provider as well.   To learn more about what you can do with MyChart, go to [email or web address].

## 2023-09-12 ENCOUNTER — Other Ambulatory Visit: Payer: Self-pay

## 2023-09-12 ENCOUNTER — Other Ambulatory Visit: Payer: Self-pay | Admitting: Pharmacy Technician

## 2023-09-12 NOTE — Progress Notes (Signed)
 Specialty Pharmacy Refill Coordination Note  Sabrina Wilson is a 83 y.o. female contacted today regarding refills of specialty medication(s) Abemaciclib  (VERZENIO )   Patient requested Delivery   Delivery date: 09/19/23 (Closed 09/18/23)   Verified address: [address] [place], [place] [address]   Medication will be filled on 09/15/23.   Sent patient mychart message for new delivery date of 09/19/23. We are closed 09/18/23.

## 2023-09-14 ENCOUNTER — Encounter: Payer: Self-pay | Admitting: Student in an Organized Health Care Education/Training Program

## 2023-09-14 ENCOUNTER — Ambulatory Visit: Payer: Medicare Other | Admitting: Student in an Organized Health Care Education/Training Program

## 2023-09-14 VITALS — BP 148/88 | HR 72 | Ht 66.0 in | Wt 177.0 lb

## 2023-09-14 DIAGNOSIS — J42 Unspecified chronic bronchitis: Secondary | ICD-10-CM

## 2023-09-14 DIAGNOSIS — J4531 Mild persistent asthma with (acute) exacerbation: Secondary | ICD-10-CM | POA: Diagnosis not present

## 2023-09-14 DIAGNOSIS — R0602 Shortness of breath: Secondary | ICD-10-CM

## 2023-09-14 MED ORDER — BREZTRI AEROSPHERE 160-9-4.8 MCG/ACT IN AERO: 2.0000 | INHALATION_SPRAY | Freq: Two times a day (BID) | RESPIRATORY_TRACT | 11 refills | Status: AC

## 2023-09-14 NOTE — Progress Notes (Signed)
 Assessment & Plan:   1. Shortness of breath 2. Mild persistent asthmatic bronchitis 3. Chronic bronchitis, unspecified chronic bronchitis type (HCC)  She is presenting today for follow up of shortness of breath, with history of smoking and report of wheeze. Pulmonary function testing showed obstruction on spirometry, with excellent response to bronchodilator challenge. There was a moderate drop in DLCO. This is overall consistent with COPD, with a possible component of asthma (eosinophil count historically as high as 500-600).   Given this, we started her on triple ICS/LABA/LAMA therapy with Breztri  which she has felt was very helpful and has resolved her symptoms.  I will maintain her on this current dose of the ICS given resolution.  Should she have exacerbations down the road, we would consider increasing the ICS dose and her inhalers.   - Continue Breztri , 2 puffs twice daily.  Prescription refill sent.  Return in about 1 year (around 09/13/2024).  I spent 30 minutes caring for this patient today, including preparing to see the patient, obtaining a medical history , reviewing a separately obtained history, performing a medically appropriate examination and/or evaluation, counseling and educating the patient/family/caregiver, ordering medications, tests, or procedures, documenting clinical information in the electronic health record, and independently interpreting results (not separately reported/billed) and communicating results to the patient/family/caregiver  Sabrina Glasgow, MD [facility]   End of visit medications:  No orders of the defined types were placed in this encounter.    Current Outpatient Medications:    abemaciclib  (VERZENIO ) 50 MG tablet, Take 1 tablet (50 mg total) by mouth 2 (two) times daily., Disp: 56 tablet, Rfl: 3   albuterol  (VENTOLIN  HFA) 108 (90 Base) MCG/ACT inhaler, Inhale 2 puffs into the lungs every 6 (six) hours as needed for  wheezing or shortness of breath., Disp: 8 g, Rfl: 2   anastrozole  (ARIMIDEX ) 1 MG tablet, TAKE 1 TABLET BY MOUTH DAILY, Disp: 90 tablet, Rfl: 0   atorvastatin  (LIPITOR) 10 MG tablet, TAKE 1 TABLET BY MOUTH DAILY, Disp: 30 tablet, Rfl: 1   Budeson-Glycopyrrol-Formoterol (BREZTRI  AEROSPHERE) 160-9-4.8 MCG/ACT AERO, Inhale 2 puffs into the lungs in the morning and at bedtime., Disp: 1 each, Rfl: 11   furosemide  (LASIX ) 20 MG tablet, TAKE 1 TABLET BY MOUTH EVERY OTHER DAY, Disp: 30 tablet, Rfl: 0   halobetasol  (ULTRAVATE ) 0.05 % cream, Apply topically to aa's of rash at body twice daily on weekends only as needed. Avoid applying to face, groin, and axilla. Use as directed., Disp: 50 g, Rfl: 1   KLOR-CON  M20 20 MEQ tablet, TAKE 1 TABLET BY MOUTH EVERY OTHER DAY, Disp: 30 tablet, Rfl: 0   loratadine (CLARITIN) 10 MG tablet, Take 10 mg by mouth daily., Disp: , Rfl:    Multiple Vitamins-Minerals (PRESERVISION AREDS 2 PO), , Disp: , Rfl:    niacinamide 500 MG tablet, Take 500 mg by mouth 2 (two) times daily., Disp: , Rfl:    Omega-3 Fatty Acids (FISH OIL PO), Take 1 capsule by mouth daily., Disp: , Rfl:    Spacer/Aero-Holding Chambers (AEROCHAMBER MV) inhaler, Use as instructed, Disp: 1 each, Rfl: 0   tacrolimus  (PROTOPIC ) 0.1 % ointment, Apply topically to affected areas of rash twice daily on week days only prn, Disp: 60 g, Rfl: 1   Subjective:   PATIENT ID: Sabrina Wilson GENDER: female DOB: November 22, 1940, MRN: [id number]  Chief Complaint  Patient presents with   Shortness of Breath    HPI  Sabrina Wilson is a pleasant  83 year old female presenting today for follow up. I first met her on 01/23/2023 at which point I ordered PFT's and initiated her on ICS/LABA/LAMA.   Patient continues to feel very well with her Breztri  (ICS/LABA/LAMA).  She is reporting that symptoms are 1/10 in intensity and are much improved.  Cough and wheeze are also resolved.  She has not had any hemoptysis, fevers, chills, night  sweats, or weight changes.  She is using her inhaler regularly with a spacer device.  She has not noticed any difference with the spacer device versus without it.  Patient follows with Dr. Valentine Gasmen from Oncology and was diagnosed with Stage IIA breast cancer for which she's receiving treatment. She did not tolerate the Abemaciclib  and is not currently on it. She was also diagnosed with provoked pulmonary embolism in February of 2024 for which she received 6 months of Apixaban  (discontinued August of 2024). Patient was also referred to cardiology for evaluation of her symptoms.  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 She has a history of smoking, having quit in 2009.  Ancillary information including prior medications, full medical/surgical/family/social histories, and PFTs (when available) are listed below and have been reviewed.   Review of Systems  Constitutional:  Negative for chills, fever and weight loss.  Respiratory:  Negative for cough, hemoptysis, sputum production, shortness of breath and wheezing.   Cardiovascular:  Negative for chest pain and palpitations.     Objective:   Vitals:   09/14/23 1331  BP: (!) 148/88  Pulse: 72  SpO2: 100%  Weight: 177 lb (80.3 kg)  Height: 5\' 6"  (1.676 m)   100% on RA  BMI Readings from Last 3 Encounters:  09/14/23 28.57 kg/m  08/28/23 28.25 kg/m  08/23/23 28.21 kg/m   Wt Readings from Last 3 Encounters:  09/14/23 177 lb (80.3 kg)  08/28/23 175 lb (79.4 kg)  08/23/23 174 lb 12.8 oz (79.3 kg)    Physical Exam Constitutional:      Appearance: Normal appearance.  Cardiovascular:     Rate and Rhythm: Normal rate and regular rhythm.     Pulses: Normal pulses.     Heart sounds: Normal heart sounds.  Pulmonary:     Effort: Pulmonary effort is normal.     Breath sounds: Normal breath sounds.  Neurological:     General: No focal deficit present.     Mental Status: She is alert and oriented  to person, place, and time. Mental status is at baseline.       Ancillary Information    Past Medical History:  Diagnosis Date   Actinic keratosis 02/12/2020   R lat calf (hypertrophic)   Arthritis    Cancer of sigmoid (HCC) 06/17/2016   Colon cancer (HCC) 2009   T3,N0; s/p resection  Dr. Jerre Moots and chemotherapy by Dr. Sabrina Crandall   Diastolic dysfunction    a. 08/2020 Echo: EF 60-65%, no rwma, Gr1 DD, nl RV size/fxn. Mild MR.   Hernia of flank    History of actinic keratoses 08/11/2020   Bx proven at left lateral ankle proximal, LN2 10/08/20   History of stress test    a. 09/2020 MV: EF >65%. No ischemia/infarct. Mild Ao Ca2+ and minimal Cor Ca2+.   Hypertension    Neuropathy    Personal history of radiation therapy 2023   right breast CA   Polyp at cervical os    s/p resection Dr. Annabell Key   Skin cancer 01/22/2020  surface of an atypical verrucous squamous proliferation    Squamous cell carcinoma of skin 07/16/2020   Left lateral ankle anterior, treated with [facility] 08-11-2020     Family History  Problem Relation Age of Onset   Stroke Mother    Diabetes Mother    Colon cancer Father        dx 25s   Stroke Sister    Colon cancer Brother        dx 61s-70s   Brain cancer Brother        dx 60s-70s   Breast cancer Neg Hx      Past Surgical History:  Procedure Laterality Date   BREAST BIOPSY Right 05/2014   Dr. Butch Cashing office-benign   BREAST LUMPECTOMY WITH SENTINEL LYMPH NODE BIOPSY Right 01/21/2022   Procedure: BREAST LUMPECTOMY WITH SENTINEL LYMPH NODE BX;  Surgeon: Marshall Skeeter, MD;  Location: [facility] ORS;  Service: General;  Laterality: Right;   BREAST SURGERY Right February 2016   Vacuum assisted biopsy for recurrent cyst, fibrocystic changes without evidence of malignancy.   CATARACT EXTRACTION W/PHACO Left 01/13/2015   Procedure: CATARACT EXTRACTION PHACO AND INTRAOCULAR LENS PLACEMENT (IOC);  Surgeon: Clair Crews, MD;  Location: [facility] ORS;  Service:  Ophthalmology;  Laterality: Left;  US   1:02.9 AP   19.2 CDE  12.06 casette lot #  [id number]   CATARACT EXTRACTION W/PHACO Right 02/03/2015   Procedure: CATARACT EXTRACTION PHACO AND INTRAOCULAR LENS PLACEMENT (IOC);  Surgeon: Clair Crews, MD;  Location: [facility] ORS;  Service: Ophthalmology;  Laterality: Right;  [id number] ap46.5 [id number]   COLECTOMY     COLONOSCOPY  2015   Dr Janine Melbourne   COLONOSCOPY WITH PROPOFOL  N/A 09/13/2017   Procedure: COLONOSCOPY WITH PROPOFOL ;  Surgeon: Marshall Skeeter, MD;  Location: [facility] ENDOSCOPY;  Service: Endoscopy;  Laterality: N/A;   COLONOSCOPY WITH PROPOFOL  N/A 11/08/2017   Procedure: COLONOSCOPY WITH PROPOFOL ;  Surgeon: Marshall Skeeter, MD;  Location: [facility] ENDOSCOPY;  Service: Endoscopy;  Laterality: N/A;   colostomy reversal     DILATION AND CURETTAGE OF UTERUS  2014   Dr. Annabell Key, benign per pt   EYE SURGERY     HERNIA REPAIR  07/13/12   ventral    SKIN CANCER EXCISION     TONSILLECTOMY      Social History   Socioeconomic History   Marital status: Married    Spouse name: Not on file   Number of children: Not on file   Years of education: Not on file   Highest education level: Not on file  Occupational History   Not on file  Tobacco Use   Smoking status: Former    Current packs/day: 0.00    Types: Cigarettes    Quit date: 10/12/2007    Years since quitting: 15.9   Smokeless tobacco: Never  Vaping Use   Vaping status: Never Used  Substance and Sexual Activity   Alcohol use: Yes    Alcohol/week: 1.0 standard drink of alcohol    Types: 1 Standard drinks or equivalent per week    Comment: with dinner GLASS OF WINE EACH DAY   Drug use: No   Sexual activity: Not on file  Other Topics Concern   Not on file  Social History Narrative   Lives in [place] with husband. 2 children, son lives nearby.Diet - regularExercise - none. 30 mins/in [place]. Quit smoking in 2009. Wine before dinner. Pianist in church; real estate; Diplomatic Services operational officer.     Social Drivers of Health  Financial Resource Strain: Low Risk  (11/21/2022)   Overall Financial Resource Strain (CARDIA)    Difficulty of Paying Living Expenses: Not hard at all  Food Insecurity: No Food Insecurity (11/21/2022)   Hunger Vital Sign    Worried About Running Out of Food in the Last Year: Never true    Ran Out of Food in the Last Year: Never true  Transportation Needs: No Transportation Needs (11/21/2022)   PRAPARE - Administrator, Civil Service (Medical): No    Lack of Transportation (Non-Medical): No  Physical Activity: Inactive (11/21/2022)   Exercise Vital Sign    Days of Exercise per Week: 0 days    Minutes of Exercise per Session: 0 min  Stress: No Stress Concern Present (11/21/2022)   [employer] of Occupational Health - Occupational Stress Questionnaire    Feeling of Stress : Not at all  Social Connections: Socially Integrated (11/21/2022)   Social Connection and Isolation Panel [NHANES]    Frequency of Communication with Friends and Family: More than three times a week    Frequency of Social Gatherings with Friends and Family: More than three times a week    Attends Religious Services: More than 4 times per year    Active Member of Clubs or Organizations: Yes    Attends Banker Meetings: More than 4 times per year    Marital Status: Married  Catering manager Violence: Not At Risk (11/21/2022)   Humiliation, Afraid, Rape, and Kick questionnaire    Fear of Current or Ex-Partner: No    Emotionally Abused: No    Physically Abused: No    Sexually Abused: No     Allergies  Allergen Reactions   Sulfa Antibiotics Hives    As a child     CBC    Component Value Date/Time   WBC 4.9 08/23/2023 1317   WBC 5.8 07/14/2022 1505   RBC 3.05 (L) 08/23/2023 1317   HGB 11.1 (L) 08/23/2023 1317   HGB 12.4 05/15/2013 1445   HCT 32.7 (L) 08/23/2023 1317   HCT 38.2 05/15/2013 1445   PLT 190 08/23/2023 1317   PLT 255 05/15/2013 1445   MCV  107.2 (H) 08/23/2023 1317   MCV 96 05/15/2013 1445   MCH 36.4 (H) 08/23/2023 1317   MCHC 33.9 08/23/2023 1317   RDW 15.3 08/23/2023 1317   RDW 13.3 05/15/2013 1445   LYMPHSABS 1.1 08/23/2023 1317   LYMPHSABS 2.0 05/15/2013 1445   MONOABS 0.5 08/23/2023 1317   MONOABS 0.6 05/15/2013 1445   EOSABS 0.1 08/23/2023 1317   EOSABS 0.4 05/15/2013 1445   BASOSABS 0.1 08/23/2023 1317   BASOSABS 0.1 05/15/2013 1445    Pulmonary Functions Testing Results:    Latest Ref Rng & Units 02/09/2023    4:36 PM  PFT Results  FVC-Pre L 1.59   FVC-Predicted Pre % 54   FVC-Post L 1.94   FVC-Predicted Post % 66   Pre FEV1/FVC % % 62   Post FEV1/FCV % % 73   FEV1-Pre L 0.98   FEV1-Predicted Pre % 44   FEV1-Post L 1.41   DLCO uncorrected ml/min/mmHg 10.35   DLCO UNC% % 50   DLVA Predicted % 72   TLC L 4.93   TLC % Predicted % 90   RV % Predicted % 120     Outpatient Medications Prior to Visit  Medication Sig Dispense Refill   abemaciclib  (VERZENIO ) 50 MG tablet Take 1 tablet (50 mg total) by mouth 2 (  two) times daily. 56 tablet 3   albuterol  (VENTOLIN  HFA) 108 (90 Base) MCG/ACT inhaler Inhale 2 puffs into the lungs every 6 (six) hours as needed for wheezing or shortness of breath. 8 g 2   anastrozole  (ARIMIDEX ) 1 MG tablet TAKE 1 TABLET BY MOUTH DAILY 90 tablet 0   atorvastatin  (LIPITOR) 10 MG tablet TAKE 1 TABLET BY MOUTH DAILY 30 tablet 1   Budeson-Glycopyrrol-Formoterol (BREZTRI  AEROSPHERE) 160-9-4.8 MCG/ACT AERO Inhale 2 puffs into the lungs in the morning and at bedtime. 1 each 11   furosemide  (LASIX ) 20 MG tablet TAKE 1 TABLET BY MOUTH EVERY OTHER DAY 30 tablet 0   halobetasol  (ULTRAVATE ) 0.05 % cream Apply topically to aa's of rash at body twice daily on weekends only as needed. Avoid applying to face, groin, and axilla. Use as directed. 50 g 1   KLOR-CON  M20 20 MEQ tablet TAKE 1 TABLET BY MOUTH EVERY OTHER DAY 30 tablet 0   loratadine (CLARITIN) 10 MG tablet Take 10 mg by mouth daily.      Multiple Vitamins-Minerals (PRESERVISION AREDS 2 PO)      niacinamide 500 MG tablet Take 500 mg by mouth 2 (two) times daily.     Omega-3 Fatty Acids (FISH OIL PO) Take 1 capsule by mouth daily.     Spacer/Aero-Holding Chambers (AEROCHAMBER MV) inhaler Use as instructed 1 each 0   tacrolimus  (PROTOPIC ) 0.1 % ointment Apply topically to affected areas of rash twice daily on week days only prn 60 g 1   Calcium  Carbonate-Vit D-Min (CALCIUM  1200 PO) Take 1 tablet by mouth daily. (Patient not taking: Reported on 08/23/2023)     VITAMIN D , CHOLECALCIFEROL, PO Take 2,000 Units by mouth daily. (Patient not taking: Reported on 08/23/2023)     No facility-administered medications prior to visit.

## 2023-09-15 ENCOUNTER — Other Ambulatory Visit: Payer: Self-pay

## 2023-09-26 ENCOUNTER — Encounter: Payer: Self-pay | Admitting: Internal Medicine

## 2023-09-26 ENCOUNTER — Inpatient Hospital Stay: Admitting: Internal Medicine

## 2023-09-26 ENCOUNTER — Inpatient Hospital Stay: Attending: Internal Medicine

## 2023-09-26 DIAGNOSIS — M8580 Other specified disorders of bone density and structure, unspecified site: Secondary | ICD-10-CM | POA: Diagnosis not present

## 2023-09-26 DIAGNOSIS — Z79811 Long term (current) use of aromatase inhibitors: Secondary | ICD-10-CM | POA: Diagnosis not present

## 2023-09-26 DIAGNOSIS — N183 Chronic kidney disease, stage 3 unspecified: Secondary | ICD-10-CM | POA: Insufficient documentation

## 2023-09-26 DIAGNOSIS — C50811 Malignant neoplasm of overlapping sites of right female breast: Secondary | ICD-10-CM

## 2023-09-26 DIAGNOSIS — I129 Hypertensive chronic kidney disease with stage 1 through stage 4 chronic kidney disease, or unspecified chronic kidney disease: Secondary | ICD-10-CM | POA: Diagnosis not present

## 2023-09-26 DIAGNOSIS — Z87891 Personal history of nicotine dependence: Secondary | ICD-10-CM | POA: Diagnosis not present

## 2023-09-26 DIAGNOSIS — Z170 Estrogen receptor positive status [ER+]: Secondary | ICD-10-CM

## 2023-09-26 DIAGNOSIS — Z85828 Personal history of other malignant neoplasm of skin: Secondary | ICD-10-CM | POA: Insufficient documentation

## 2023-09-26 DIAGNOSIS — J449 Chronic obstructive pulmonary disease, unspecified: Secondary | ICD-10-CM | POA: Insufficient documentation

## 2023-09-26 DIAGNOSIS — Z79899 Other long term (current) drug therapy: Secondary | ICD-10-CM | POA: Diagnosis not present

## 2023-09-26 LAB — CBC WITH DIFFERENTIAL (CANCER CENTER ONLY)
Abs Immature Granulocytes: 0.03 10*3/uL (ref 0.00–0.07)
Basophils Absolute: 0.1 10*3/uL (ref 0.0–0.1)
Basophils Relative: 2 %
Eosinophils Absolute: 0.2 10*3/uL (ref 0.0–0.5)
Eosinophils Relative: 4 %
HCT: 33 % — ABNORMAL LOW (ref 36.0–46.0)
Hemoglobin: 10.9 g/dL — ABNORMAL LOW (ref 12.0–15.0)
Immature Granulocytes: 1 %
Lymphocytes Relative: 24 %
Lymphs Abs: 1.1 10*3/uL (ref 0.7–4.0)
MCH: 36.1 pg — ABNORMAL HIGH (ref 26.0–34.0)
MCHC: 33 g/dL (ref 30.0–36.0)
MCV: 109.3 fL — ABNORMAL HIGH (ref 80.0–100.0)
Monocytes Absolute: 0.5 10*3/uL (ref 0.1–1.0)
Monocytes Relative: 10 %
Neutro Abs: 2.7 10*3/uL (ref 1.7–7.7)
Neutrophils Relative %: 59 %
Platelet Count: 177 10*3/uL (ref 150–400)
RBC: 3.02 MIL/uL — ABNORMAL LOW (ref 3.87–5.11)
RDW: 13 % (ref 11.5–15.5)
WBC Count: 4.6 10*3/uL (ref 4.0–10.5)
nRBC: 0 % (ref 0.0–0.2)

## 2023-09-26 LAB — CMP (CANCER CENTER ONLY)
ALT: 14 U/L (ref 0–44)
AST: 22 U/L (ref 15–41)
Albumin: 4.3 g/dL (ref 3.5–5.0)
Alkaline Phosphatase: 44 U/L (ref 38–126)
Anion gap: 9 (ref 5–15)
BUN: 27 mg/dL — ABNORMAL HIGH (ref 8–23)
CO2: 25 mmol/L (ref 22–32)
Calcium: 9.9 mg/dL (ref 8.9–10.3)
Chloride: 103 mmol/L (ref 98–111)
Creatinine: 1.24 mg/dL — ABNORMAL HIGH (ref 0.44–1.00)
GFR, Estimated: 44 mL/min — ABNORMAL LOW (ref >60)
Glucose, Bld: 111 mg/dL — ABNORMAL HIGH (ref 70–99)
Potassium: 3.9 mmol/L (ref 3.5–5.1)
Sodium: 137 mmol/L (ref 135–145)
Total Bilirubin: 0.6 mg/dL (ref 0.0–1.2)
Total Protein: 7.1 g/dL (ref 6.5–8.1)

## 2023-09-26 LAB — VITAMIN D 25 HYDROXY (VIT D DEFICIENCY, FRACTURES): Vit D, 25-Hydroxy: 48.65 ng/mL (ref 30–100)

## 2023-09-26 NOTE — Progress Notes (Signed)
 Patient is doing well, no new questions or concerns for the doctor today.

## 2023-09-26 NOTE — Assessment & Plan Note (Signed)
#   Right breast cancer -[facility]; ER/PR positive Her 2 Negative ;  G-3; LVI + T2N1.  Stage II [OCT 2023; Dr.Byrnett] s/p Lumpectomy; node positive-Oncotype RS- 35- s/p  adjuvant chemotherapy with Taxotere -Cytoxan  every 3 weeks x4 cycles.  S/p postlumpectomy radiation April 3rd 2024. Poor tolerance to adjuvant abemaciclib  100 mg BID [started second week of June].  # Currently on anastrozole  once a day [April 2024]; AND in JAN 2025- re-started reduced dose at 50 mg BID.  Labs-CBC/chemistries were reviewed with the patient. Stable.   # COPD [Dr.Gonzalez]-on Breztri /albuterol  -stable.   # [2nd,FEB 2024] CTA-  acute pulmonary embolism/ LEFT LE- Acute DVT-- [provoked sec to chemo]. Stop eliquis  in End of  AUG 2024. Stable.    # Bone health-2023 osteopenic T-score of -1.1.Continue calcium  plus vitamin D .- stable.  I discussed the role of adjuvant Zometa  every 6 months x 3 years. S/p dental evaluation - stable. Last zometa  in JAN 8th, 2025-    # CKD- stage III- monitor for now./ APRIL 2025- CA 10.4; ca+vitD on stable.   # Right arm decreased range of motion/swelling- ?  Lymphedema- s/p evaluation- Maureen/OT- stable.   # Vaccination: s/p COVID; Flu; OK with RSV.   # IV Access: PIV   # Zometa  - 05/03/2023 q.   # DISPOSITION: # follow in 5  weeks- MD;labs- cbc/cmp; vit D 25-OH; possible zometa .- Dr.B

## 2023-09-26 NOTE — Progress Notes (Signed)
 [facility] CONSULT NOTE  Patient Care Team: Pcp, No as PCP - General Sabrina Delton, MD as PCP - Cardiology (Cardiology) Byrnett, Sabrina Schmitz, MD as Consulting Physician (General Surgery) Sabrina Han, MD (Internal Medicine) Sabrina Hageman, RN as Oncology Nurse Navigator Sabrina Langdon, MD as Consulting Physician (Radiation Oncology) Sabrina Leos, MD as Consulting Physician (Internal Medicine) Sabrina Delay, DO as Consulting Physician (General Surgery)  CHIEF COMPLAINTS/PURPOSE OF CONSULTATION: Breast cancer  #  Oncology History Overview Note  # 2009- Sigmoid colon cancer stage II (T3 N0 4 lymph nodes sampled M0 stage II high risk there was obstruction and perforation abscess; Dr.Hern), workup included a low CEA preoperatively and a chest x-ray and CT scan abdomen pelvis negative for metastatic disease, S/P colostomy; s/p FOLFOX x 6 months. [Dr.Gittin ]  # LATER REVERSED , K RAS  wild type B RAF not evaluated   Also initial iron deficiency with anemia thrombocytosis, high platelets considered reactive, workup includes a normal PCR for CML and a negative [id number] mutation  DIAGNOSIS:  A. BREAST, RIGHT; LUMPECTOMY:  - INVASIVE MAMMARY CARCINOMA.  - DUCTAL CARCINOMA IN SITU (DCIS).  - SEE CANCER SUMMARY BELOW.  - BIOPSY SITE CHANGE WITH CLIP (2).  - FIBROUS TISSUE WITH CYST FORMATION AND FLORID DUCTAL HYPERPLASIA.  - SKIN AND NIPPLE NEGATIVE FOR MALIGNANCY.  - VASCULAR CALCIFICATION.   B. SENTINEL LYMPH NODES 1 AND 2, RIGHT AXILLA; EXCISION:  - METASTATIC CARCINOMA INVOLVES ONE OF TWO LYMPH NODES (1/2).   C. NON-SENTINEL LYMPH NODES, RIGHT AXILLA; EXCISION:  - ONE LYMPH NODE NEGATIVE FOR MALIGNANCY (0/1).   CANCER CASE SUMMARY: INVASIVE CARCINOMA OF THE BREAST  Standard(s): AJCC-UICC 8   SPECIMEN  Procedure: Lumpectomy  Specimen Laterality: Right   TUMOR  Histologic Type: Invasive carcinoma of no special type (ductal)  Histologic Grade  (Nottingham Histologic Score)       Glandular (Acinar)/Tubular Differentiation: 3       Nuclear Pleomorphism: 3       Mitotic Rate: 3       Overall Grade: 3  Tumor Size: 24 mm  Tumor Focality: Single focus of invasive carcinoma  Ductal Carcinoma In Situ (DCIS): Present, nuclear grade 2-3  Tumor Extent: Not applicable  Lymphatic and/or Vascular Invasion: Present  Treatment Effect in the Breast: No known presurgical therapy   MARGINS  Margin Status for Invasive Carcinoma: All margins negative for invasive  carcinoma       Distance from closest margin: 10 mm       Specify closest margin: Superior   Margin Status for DCIS: All margins negative for DCIS       Distance from DCIS to closest margin: 10 mm       Specify closest margin: Superior   REGIONAL LYMPH NODES  Regional Lymph Node Status: Tumor present in regional lymph node(s)       Number of Lymph Nodes with Macrometastases (greater than 2 mm): 1       Number of Lymph Nodes with Micrometastases (greater than 0.2 mm to  2 mm and/or greater than 200 cells): 0       Number of Lymph Nodes with Isolated Tumor Cells (0.2 mm or less OR  200 cells or less): 0       Size of Largest Metastatic Deposit: 3 mm      Extranodal Extension: Not identified       Total Number of Lymph Nodes Examined (sentinel and non-sentinel): 3  Number of Sentinel Nodes Examined: 2   DISTANT METASTASIS  Distant Site(s) Involved, if applicable: Not applicable   PATHOLOGIC STAGE CLASSIFICATION (pTNM, AJCC 8th Edition):  Modified Classification: Not applicable  pT Category: pT2  T Suffix: Not applicable  pN Category: pN1a  N Suffix: (sn)  pM Category: Not applicable   SPECIAL STUDIES  Breast Biomarker Testing Performed on Previous Outside Biopsy:  23-250-T11  Per outside report:  Estrogen Receptor (ER) Status: POSITIVE          Percentage of cells with nuclear positivity: Greater than 90%          Average intensity of staining: Strong    Progesterone Receptor (PgR) Status: POSITIVE          Percentage of cells with nuclear positivity: 60%          Average intensity of staining: Strong   HER2 (by immunohistochemistry): EQUIVOCAL (Score 2+)  HER2 FISH: NEGATIVE   Ki-67: Not performed   # Cytoxan  Taxotere  x 4 cycles; finished JAN, 2024. [Sabrina Wilson s/p cycle #4;   -FEB 2nd, 2024-right lung PE; left lower LE  DVT- on eliquis .   # FEB 19th-start RT; mpectomy radiation April 3rd 2024.  # anastrozole  once a day [April 2024]-t #  Poor tolerance to adjuvant abemaciclib  100 mg BID [started second week of June] stopped x 1 month.    # JAN 8th 2024- start abemaciclib  50 mg BID       Cancer of sigmoid (HCC) (Resolved)  Carcinoma of overlapping sites of right breast in female, estrogen receptor positive (HCC)  02/14/2022 Initial Diagnosis   Carcinoma of overlapping sites of right breast in female, estrogen receptor positive (HCC)   02/14/2022 Cancer Staging   Staging form: Breast, AJCC 8th Edition - Pathologic: Stage IIA (pT2, pN1, cM0, G3, ER+, PR+, HER2-) - Signed by Sabrina Leos, MD on 02/14/2022 Histologic grading system: 3 grade system   02/23/2022 -  Chemotherapy   Patient is on Treatment Plan : BREAST TC q21d      HISTORY OF PRESENTING ILLNESS:   Alone.  Ambulating independently.   Sabrina Wilson 83 y.o.  female patient with ER/PR positive Her 2 Negative  breast cancer stage II T2 N1-currently  adjuvant anastrozole -and verzinio  [re-started in JAN 2025] is here for a follow up.   Patient is doing well, no new questions or concerns for the doctor today. Diarrhea 3-4 times a month. No anti-diarrheal.   Neuropathy in fingertips. Still able to play the piano. Denies pain. Appetite is normal.  Denies any nausea vomiting.  Review of Systems  Constitutional:  Negative for chills, diaphoresis and fever.  HENT:  Negative for nosebleeds and sore throat.   Eyes:  Negative for double vision.   Respiratory:  Negative for hemoptysis, sputum production and wheezing.   Cardiovascular:  Negative for chest pain, palpitations, orthopnea and leg swelling.  Gastrointestinal:  Negative for blood in stool, constipation, diarrhea, heartburn, melena and vomiting.  Genitourinary:  Negative for dysuria, frequency and urgency.  Musculoskeletal:  Positive for back pain and joint pain.  Skin: Negative.  Negative for itching and rash.  Neurological:  Negative for dizziness, tingling, focal weakness, weakness and headaches.  Endo/Heme/Allergies:  Does not bruise/bleed easily.  Psychiatric/Behavioral:  Negative for depression. The patient is not nervous/anxious and does not have insomnia.      MEDICAL HISTORY:  Past Medical History:  Diagnosis Date   Actinic keratosis 02/12/2020   R lat calf (hypertrophic)  Arthritis    Cancer of sigmoid (HCC) 06/17/2016   Colon cancer (HCC) 2009   T3,N0; s/p resection  Dr. Jerre Moots and chemotherapy by Dr. Sabrina Crandall   Diastolic dysfunction    a. 08/2020 Echo: EF 60-65%, no rwma, Gr1 DD, nl RV size/fxn. Mild MR.   Hernia of flank    History of actinic keratoses 08/11/2020   Bx proven at left lateral ankle proximal, LN2 10/08/20   History of stress test    a. 09/2020 MV: EF >65%. No ischemia/infarct. Mild Ao Ca2+ and minimal Cor Ca2+.   Hypertension    Neuropathy    Personal history of radiation therapy 2023   right breast CA   Polyp at cervical os    s/p resection Dr. Annabell Key   Skin cancer 01/22/2020   surface of an atypical verrucous squamous proliferation    Squamous cell carcinoma of skin 07/16/2020   Left lateral ankle anterior, [facility] with [facility] 08-11-2020    SURGICAL HISTORY: Past Surgical History:  Procedure Laterality Date   BREAST BIOPSY Right 05/2014   Dr. Butch Cashing office-benign   BREAST LUMPECTOMY WITH SENTINEL LYMPH NODE BIOPSY Right 01/21/2022   Procedure: BREAST LUMPECTOMY WITH SENTINEL LYMPH NODE BX;  Surgeon: Marshall Skeeter, MD;  Location:  [facility] ORS;  Service: General;  Laterality: Right;   BREAST SURGERY Right February 2016   Vacuum assisted biopsy for recurrent cyst, fibrocystic changes without evidence of malignancy.   CATARACT EXTRACTION W/PHACO Left 01/13/2015   Procedure: CATARACT EXTRACTION PHACO AND INTRAOCULAR LENS PLACEMENT (IOC);  Surgeon: Clair Crews, MD;  Location: [facility] ORS;  Service: Ophthalmology;  Laterality: Left;  US   1:02.9 AP   19.2 CDE  12.06 casette lot #  [id number]   CATARACT EXTRACTION W/PHACO Right 02/03/2015   Procedure: CATARACT EXTRACTION PHACO AND INTRAOCULAR LENS PLACEMENT (IOC);  Surgeon: Clair Crews, MD;  Location: [facility] ORS;  Service: Ophthalmology;  Laterality: Right;  us00:53 ap46.5 [id number]   COLECTOMY     COLONOSCOPY  2015   Dr Janine Melbourne   COLONOSCOPY WITH PROPOFOL  N/A 09/13/2017   Procedure: COLONOSCOPY WITH PROPOFOL ;  Surgeon: Marshall Skeeter, MD;  Location: [facility] ENDOSCOPY;  Service: Endoscopy;  Laterality: N/A;   COLONOSCOPY WITH PROPOFOL  N/A 11/08/2017   Procedure: COLONOSCOPY WITH PROPOFOL ;  Surgeon: Marshall Skeeter, MD;  Location: [facility] ENDOSCOPY;  Service: Endoscopy;  Laterality: N/A;   colostomy reversal     DILATION AND CURETTAGE OF UTERUS  2014   Dr. Annabell Key, benign per pt   EYE SURGERY     HERNIA REPAIR  07/13/12   ventral    SKIN CANCER EXCISION     TONSILLECTOMY      SOCIAL HISTORY: Social History   Socioeconomic History   Marital status: Married    Spouse name: Not on file   Number of children: Not on file   Years of education: Not on file   Highest education level: Not on file  Occupational History   Not on file  Tobacco Use   Smoking status: Former    Current packs/day: 0.00    Types: Cigarettes    Quit date: 10/12/2007    Years since quitting: 15.9   Smokeless tobacco: Never  Vaping Use   Vaping status: Never Used  Substance and Sexual Activity   Alcohol use: Yes    Alcohol/week: 1.0 standard drink of alcohol    Types: 1 Standard drinks or  equivalent per week    Comment: with dinner GLASS OF WINE EACH DAY   Drug  use: No   Sexual activity: Not on file  Other Topics Concern   Not on file  Social History Narrative   Lives in [place] with husband. 2 children, son lives nearby.Diet - regularExercise - none. 30 mins/in [place]. Quit smoking in 2009. Wine before dinner. Pianist in church; real estate; Diplomatic Services operational officer.    Social Drivers of Corporate investment banker Strain: Low Risk  (11/21/2022)   Overall Financial Resource Strain (CARDIA)    Difficulty of Paying Living Expenses: Not hard at all  Food Insecurity: No Food Insecurity (11/21/2022)   Hunger Vital Sign    Worried About Running Out of Food in the Last Year: Never true    Ran Out of Food in the Last Year: Never true  Transportation Needs: No Transportation Needs (11/21/2022)   PRAPARE - Administrator, Civil Service (Medical): No    Lack of Transportation (Non-Medical): No  Physical Activity: Inactive (11/21/2022)   Exercise Vital Sign    Days of Exercise per Week: 0 days    Minutes of Exercise per Session: 0 min  Stress: No Stress Concern Present (11/21/2022)   [employer] of Occupational Health - Occupational Stress Questionnaire    Feeling of Stress : Not at all  Social Connections: Socially Integrated (11/21/2022)   Social Connection and Isolation Panel [NHANES]    Frequency of Communication with Friends and Family: More than three times a week    Frequency of Social Gatherings with Friends and Family: More than three times a week    Attends Religious Services: More than 4 times per year    Active Member of [employer] or Organizations: Yes    Attends Engineer, structural: More than 4 times per year    Marital Status: Married  Catering manager Violence: Not At Risk (11/21/2022)   Humiliation, Afraid, Rape, and Kick questionnaire    Fear of Current or Ex-Partner: No    Emotionally Abused: No    Physically Abused: No    Sexually Abused: No     FAMILY HISTORY: Family History  Problem Relation Age of Onset   Stroke Mother    Diabetes Mother    Colon cancer Father        dx 47s   Stroke Sister    Colon cancer Brother        dx 72s-70s   Brain cancer Brother        dx 60s-70s   Breast cancer Neg Hx     ALLERGIES:  is allergic to sulfa antibiotics.  MEDICATIONS:  Current Outpatient Medications  Medication Sig Dispense Refill   abemaciclib  (VERZENIO ) 50 MG tablet Take 1 tablet (50 mg total) by mouth 2 (two) times daily. 56 tablet 3   albuterol  (VENTOLIN  HFA) 108 (90 Base) MCG/ACT inhaler Inhale 2 puffs into the lungs every 6 (six) hours as needed for wheezing or shortness of breath. 8 g 2   anastrozole  (ARIMIDEX ) 1 MG tablet TAKE 1 TABLET BY MOUTH DAILY 90 tablet 0   atorvastatin  (LIPITOR) 10 MG tablet TAKE 1 TABLET BY MOUTH DAILY 30 tablet 1   budesonide-glycopyrrolate -formoterol (BREZTRI  AEROSPHERE) 160-9-4.8 MCG/ACT AERO inhaler Inhale 2 puffs into the lungs in the morning and at bedtime. 1 each 11   furosemide  (LASIX ) 20 MG tablet TAKE 1 TABLET BY MOUTH EVERY OTHER DAY 30 tablet 0   halobetasol  (ULTRAVATE ) 0.05 % cream Apply topically to aa's of rash at body twice daily on weekends only as needed. Avoid applying to face,  groin, and axilla. Use as directed. 50 g 1   KLOR-CON  M20 20 MEQ tablet TAKE 1 TABLET BY MOUTH EVERY OTHER DAY 30 tablet 0   loratadine (CLARITIN) 10 MG tablet Take 10 mg by mouth daily.     Multiple Vitamins-Minerals (PRESERVISION AREDS 2 PO)      niacinamide 500 MG tablet Take 500 mg by mouth 2 (two) times daily.     Omega-3 Fatty Acids (FISH OIL PO) Take 1 capsule by mouth daily.     Spacer/Aero-Holding Chambers (AEROCHAMBER MV) inhaler Use as instructed 1 each 0   tacrolimus  (PROTOPIC ) 0.1 % ointment Apply topically to affected areas of rash twice daily on week days only prn 60 g 1   No current facility-administered medications for this visit.   PHYSICAL EXAMINATION:   Vitals:   09/26/23  1425  BP: (!) 158/80  Pulse: 77  Resp: 18  Temp: 97.8 F (36.6 C)  SpO2: 100%    Filed Weights   09/26/23 1425  Weight: 177 lb (80.3 kg)    Physical Exam Vitals and nursing note reviewed.  HENT:     Head: Normocephalic and atraumatic.     Mouth/Throat:     Pharynx: Oropharynx is clear.  Eyes:     Extraocular Movements: Extraocular movements intact.     Pupils: Pupils are equal, round, and reactive to light.  Cardiovascular:     Rate and Rhythm: Normal rate and regular rhythm.     Heart sounds: Murmur heard.  Pulmonary:     Comments: Decreased breath sounds bilaterally.  Abdominal:     Palpations: Abdomen is soft.  Musculoskeletal:        General: Normal range of motion.     Cervical back: Normal range of motion.  Skin:    General: Skin is warm.  Neurological:     General: No focal deficit present.     Mental Status: She is alert and oriented to person, place, and time.  Psychiatric:        Behavior: Behavior normal.        Judgment: Judgment normal.      LABORATORY DATA:  I have reviewed the data as listed Lab Results  Component Value Date   WBC 4.6 09/26/2023   HGB 10.9 (L) 09/26/2023   HCT 33.0 (L) 09/26/2023   MCV 109.3 (H) 09/26/2023   PLT 177 09/26/2023   Recent Labs    07/26/23 1424 08/23/23 1318 09/26/23 1441  NA 135 136 137  K 4.0 4.2 3.9  CL 101 102 103  CO2 24 24 25   GLUCOSE 99 110* 111*  BUN 28* 27* 27*  CREATININE 1.41* 1.45* 1.24*  CALCIUM  10.4* 9.3 9.9  GFRNONAA 38* 36* 44*  PROT 7.3 6.8 7.1  ALBUMIN 4.5 4.3 4.3  AST 21 22 22   ALT 15 15 14   ALKPHOS 45 39 44  BILITOT 0.8 0.6 0.6    RADIOGRAPHIC STUDIES: I have personally reviewed the radiological images as listed and agreed with the findings in the report. No results found.  ASSESSMENT & PLAN:   Carcinoma of overlapping sites of right breast in female, estrogen receptor positive (HCC) # Right breast cancer -[facility]; ER/PR positive Her 2 Negative ;  G-3; LVI + T2N1.  Stage II  [OCT 2023; Dr.Byrnett] s/p Lumpectomy; node positive-Oncotype RS- 35- s/p  adjuvant chemotherapy with Taxotere -Cytoxan  every 3 weeks x4 cycles.  S/p postlumpectomy radiation April 3rd 2024. Poor tolerance to adjuvant abemaciclib  100 mg BID [started second week of  June].  # Currently on anastrozole  once a day [April 2024]; AND in JAN 2025- re-started reduced dose at 50 mg BID.  Labs-CBC/chemistries were reviewed with the patient. Stable.   # COPD [Dr.Gonzalez]-on Breztri /albuterol  -stable.   # [2nd,FEB 2024] CTA-  acute pulmonary embolism/ LEFT LE- Acute DVT-- [provoked sec to chemo]. Stop eliquis  in End of  AUG 2024. Stable.    # Bone health-2023 osteopenic T-score of -1.1.Continue calcium  plus vitamin D .- stable.  I discussed the role of adjuvant Zometa  every 6 months x 3 years. S/p dental evaluation - stable. Last zometa  in JAN 8th, 2025-    # CKD- stage III- monitor for now./ APRIL 2025- CA 10.4; ca+vitD on stable.   # Right arm decreased range of motion/swelling- ?  Lymphedema- s/p evaluation- Maureen/OT- stable.   # Vaccination: s/p COVID; Flu; OK with RSV.   # IV Access: PIV   # Zometa  - 05/03/2023 q.   # DISPOSITION: # follow in 5  weeks- MD;labs- cbc/cmp; vit D 25-OH; possible zometa .- Dr.B     All questions were answered. The patient/family knows to call the clinic with any problems, questions or concerns.    Sabrina Leos, MD 09/26/2023 3:44 PM

## 2023-10-02 ENCOUNTER — Encounter: Payer: Self-pay | Admitting: Radiation Oncology

## 2023-10-02 ENCOUNTER — Ambulatory Visit
Admission: RE | Admit: 2023-10-02 | Discharge: 2023-10-02 | Disposition: A | Discharge status: Home / Self Care | Payer: Medicare Other | Source: Ambulatory Visit | Attending: Radiation Oncology | Admitting: Radiation Oncology

## 2023-10-02 VITALS — BP 155/81 | HR 72 | Temp 97.1°F | Resp 16 | Wt 176.0 lb

## 2023-10-02 DIAGNOSIS — C50811 Malignant neoplasm of overlapping sites of right female breast: Secondary | ICD-10-CM | POA: Diagnosis not present

## 2023-10-02 DIAGNOSIS — R5383 Other fatigue: Secondary | ICD-10-CM | POA: Diagnosis not present

## 2023-10-02 DIAGNOSIS — Z79811 Long term (current) use of aromatase inhibitors: Secondary | ICD-10-CM | POA: Diagnosis not present

## 2023-10-02 DIAGNOSIS — Z170 Estrogen receptor positive status [ER+]: Secondary | ICD-10-CM | POA: Diagnosis not present

## 2023-10-02 DIAGNOSIS — Z923 Personal history of irradiation: Secondary | ICD-10-CM | POA: Insufficient documentation

## 2023-10-02 NOTE — Progress Notes (Signed)
 Radiation Oncology Follow up Note  Name: Sabrina Wilson   Date:   10/02/2023 MRN:  [id number] DOB: 10/24/1940    This 83 y.o. female presents to the clinic today for 39-month follow-up status post whole breast radiation to her right breast for stage II ER PR positive invasive mammary carcinoma  REFERRING PROVIDER: Kent Pear, MD  HPI: Patient is an 83 year old female now out 13 months having completed whole breast radiation to her right breast for stage II ER/PR positive invasive mammary carcinoma.  Seen today in routine follow-up her major concern is still some thickness of her right breast.  She also quite fatigued.  She specifically [place] breast tenderness cough or bone pain..  She had mammograms last December which I reviewed were BI-RADS 2 benign.  She is currently on Arimidex  and tolerating that well.  COMPLICATIONS OF TREATMENT: none  FOLLOW UP COMPLIANCE: keeps appointments   PHYSICAL EXAM:  BP (!) 155/81 Comment: Patient denies symptoms of HTN  Pulse 72   Temp (!) 97.1 F (36.2 C)   Resp 16   Wt 176 lb (79.8 kg)   BMI 28.41 kg/m  Right breast is somewhat thickened as compared to the left breast.  No dominant masses noted in either breast.  There is still some hyperpigmentation of the skin.  No axillary or supraclavicular adenopathy is appreciated.  Well-developed well-nourished patient in NAD. HEENT reveals PERLA, EOMI, discs not visualized.  Oral cavity is clear. No oral mucosal lesions are identified. Neck is clear without evidence of cervical or supraclavicular adenopathy. Lungs are clear to A&P. Cardiac examination is essentially unremarkable with regular rate and rhythm without murmur rub or thrill. Abdomen is benign with no organomegaly or masses noted. Motor sensory and DTR levels are equal and symmetric in the upper and lower extremities. Cranial nerves II through XII are grossly intact. Proprioception is intact. No peripheral adenopathy or edema is identified. No  motor or sensory levels are noted. Crude visual fields are within normal range.  RADIOLOGY RESULTS: Mammograms reviewed  PLAN: At the present time patient is doing well now without 13 months status post whole breast radiation.  Of asked to see her back in 1 year for follow-up.  Patient continues on Arimidex  without side effect.  Patient knows to call with any concerns.  I would like to take this opportunity to thank you for allowing me to participate in the care of your patient.Glenis Langdon, MD

## 2023-10-03 ENCOUNTER — Other Ambulatory Visit: Payer: Self-pay

## 2023-10-03 ENCOUNTER — Ambulatory Visit (INDEPENDENT_AMBULATORY_CARE_PROVIDER_SITE_OTHER)

## 2023-10-03 VITALS — BP 120/76 | HR 77 | Temp 97.9°F | Ht 66.0 in | Wt 177.0 lb

## 2023-10-03 DIAGNOSIS — N1832 Chronic kidney disease, stage 3b: Secondary | ICD-10-CM | POA: Insufficient documentation

## 2023-10-03 DIAGNOSIS — I10 Essential (primary) hypertension: Secondary | ICD-10-CM

## 2023-10-03 DIAGNOSIS — R7303 Prediabetes: Secondary | ICD-10-CM | POA: Diagnosis not present

## 2023-10-03 DIAGNOSIS — Z85038 Personal history of other malignant neoplasm of large intestine: Secondary | ICD-10-CM | POA: Diagnosis not present

## 2023-10-03 DIAGNOSIS — G629 Polyneuropathy, unspecified: Secondary | ICD-10-CM

## 2023-10-03 DIAGNOSIS — I83893 Varicose veins of bilateral lower extremities with other complications: Secondary | ICD-10-CM

## 2023-10-03 DIAGNOSIS — E785 Hyperlipidemia, unspecified: Secondary | ICD-10-CM

## 2023-10-03 MED ORDER — ATORVASTATIN CALCIUM 10 MG PO TABS
10.0000 mg | ORAL_TABLET | Freq: Every day | ORAL | 3 refills | Status: AC
Start: 2023-10-03 — End: ?

## 2023-10-03 NOTE — Assessment & Plan Note (Addendum)
 BP within goal, <130/80 mmHg. On Lasix  20 mg, every other day. I do not recommend adding antihypertensive medication at this time.

## 2023-10-03 NOTE — Assessment & Plan Note (Addendum)
  Lifestyle Modifications: Advise on leg elevation, weight management, and regular exercise. Recommend graduated compression stockings, patient does not like compression stockings.

## 2023-10-03 NOTE — Assessment & Plan Note (Signed)
 Reviewed recent renal function.  Repeat BMP.  Briefly discussed considering Farxiga for renal protection.

## 2023-10-03 NOTE — Progress Notes (Signed)
 Established Patient Office Visit TOC from Dr. Lovetta Rucks last OV with him on 06/03/22.    Subjective  Patient ID: Sabrina Wilson, female    DOB: 19-Jul-1940  Age: 83 y.o. MRN: [id number]  Chief Complaint  Patient presents with   Establish Care   Transitions Of Care    She  has a past medical history of Actinic keratosis (02/12/2020), Arthritis, Cancer of sigmoid (HCC) (06/17/2016), Colon cancer (HCC) (2009), Diastolic dysfunction, DVT (deep venous thrombosis) (HCC) (05/29/2022), Hernia of flank, History of actinic keratoses (08/11/2020), History of stress test, Hypertension, Hypomagnesemia (05/29/2022), Hyponatremia (05/29/2022), Neuropathy, Personal history of radiation therapy (2023), Polyp at cervical os, Pulmonary embolism (HCC) (05/27/2022), Skin cancer (01/22/2020), and Squamous cell carcinoma of skin (07/16/2020).  HPI - DCIS of right breast: She is seeing heme/onc every 5 weeks. On Verzenio  50 mg, BID (is expected to be on this for 2 years). Anastrozole  1 mg daily. Anemia, CKD IIIb suspected secondary to treatment.    - Hypertension:  Home BP SBP in 120- 140 mmHg, DBP 70-80 mmHg.  Was taking hydrochlorothiazide  in the past but stopped it when she was on Eliquis .   Currently on Furosemide  20 mg every other day with potassium.   - COPD: Sees pulmonology, On Breztri  and prn Albuterol . On Claritin 10 mg once a day.   - CKD IIIb: 09/26/23  Does not take NSAIDs.   - [facility] for dermatology concern. Has Halobetasol  0.05% cream and Tacrolimus  0.1% prescribed to her for occasional leg lesions which helps. Also takes Niacinamide 500 mg, twice a day.   - Mixed hyperlipidemia: On 10 mg Atorvastatin . Eats mostly home made food.   - H/O colon cancer: Last colonoscopy 10/2017 with colon polyp. She has talked to her heme/onc on repeating colonoscopy.   ROS As per HPI    Objective:      BP 120/76   Pulse 77   Temp 97.9 F (36.6 C) (Oral)   Ht 5\' 6"  (1.676 m)   Wt 177 lb  (80.3 kg)   SpO2 95%   BMI 28.57 kg/m      10/03/2023    3:04 PM 11/21/2022    3:53 PM 06/03/2022   10:49 AM  Depression screen PHQ 2/9  Decreased Interest 0 0 0  Down, Depressed, Hopeless 0 0 0  PHQ - 2 Score 0 0 0  Altered sleeping 0 0   Tired, decreased energy 0 3   Change in appetite 0 0   Feeling bad or failure about yourself  0 0   Trouble concentrating 0 0   Moving slowly or fidgety/restless 0 0   Suicidal thoughts 0 0   PHQ-9 Score 0 3   Difficult doing work/chores Not difficult at all Not difficult at all       10/03/2023    3:04 PM  GAD 7 : Generalized Anxiety Score  Nervous, Anxious, on Edge 0  Control/stop worrying 0  Worry too much - different things 0  Trouble relaxing 0  Restless 0  Easily annoyed or irritable 0  Afraid - awful might happen 0  Total GAD 7 Score 0  Anxiety Difficulty Not difficult at all      10/03/2023    3:04 PM 11/21/2022    3:53 PM 06/03/2022   10:49 AM  Depression screen PHQ 2/9  Decreased Interest 0 0 0  Down, Depressed, Hopeless 0 0 0  PHQ - 2 Score 0 0 0  Altered sleeping 0 0  Tired, decreased energy 0 3   Change in appetite 0 0   Feeling bad or failure about yourself  0 0   Trouble concentrating 0 0   Moving slowly or fidgety/restless 0 0   Suicidal thoughts 0 0   PHQ-9 Score 0 3   Difficult doing work/chores Not difficult at all Not difficult at all       10/03/2023    3:04 PM  GAD 7 : Generalized Anxiety Score  Nervous, Anxious, on Edge 0  Control/stop worrying 0  Worry too much - different things 0  Trouble relaxing 0  Restless 0  Easily annoyed or irritable 0  Afraid - awful might happen 0  Total GAD 7 Score 0  Anxiety Difficulty Not difficult at all   SDOH Screenings   Food Insecurity: No Food Insecurity (11/21/2022)  Housing: Low Risk  (11/21/2022)  Transportation Needs: No Transportation Needs (11/21/2022)  Utilities: Not At Risk (11/21/2022)  Alcohol Screen: Low Risk  (11/21/2022)  Depression (PHQ2-9): Low  Risk  (10/03/2023)  Financial Resource Strain: Low Risk  (11/21/2022)  Physical Activity: Inactive (11/21/2022)  Social Connections: Socially Integrated (11/21/2022)  Stress: No Stress Concern Present (11/21/2022)  Tobacco Use: Medium Risk (10/03/2023)  Health Literacy: Adequate Health Literacy (11/21/2022)     Physical Exam Constitutional:      Appearance: She is normal weight.  HENT:     Head: Normocephalic and atraumatic.     Right Ear: Tympanic membrane normal.     Left Ear: Tympanic membrane normal.     Mouth/Throat:     Mouth: Mucous membranes are moist.  Cardiovascular:     Rate and Rhythm: Normal rate.     Pulses:          Posterior tibial pulses are 2+ on the right side and 2+ on the left side.     Heart sounds: Murmur (faint systolic murmur on left second ICS space) heard.  Pulmonary:     Effort: Pulmonary effort is normal.     Breath sounds: No wheezing.  Abdominal:     Palpations: Abdomen is soft.     Tenderness: There is no abdominal tenderness. There is no guarding.  Musculoskeletal:     Cervical back: Normal range of motion and neck supple.     Right lower leg: No edema.     Left lower leg: No edema.     Right foot: Normal range of motion.     Left foot: Normal range of motion.     Comments: Inspection: Bilateral varicose veins noted on b/l lower extremities without skin.  Palpation: No tenderness or palpable cords. Normal skin temperature. No edema noted.    Feet:     Right foot:     Protective Sensation: 7 sites tested.  7 sites sensed.     Skin integrity: No ulcer.     Toenail Condition: Right toenails are normal.     Left foot:     Protective Sensation: 7 sites tested.  7 sites sensed.     Skin integrity: No ulcer.     Toenail Condition: Left toenails are normal.  Lymphadenopathy:     Cervical: No cervical adenopathy.  Skin:    General: Skin is warm.  Neurological:     Mental Status: She is alert and oriented to person, place, and time.  Psychiatric:         Mood and Affect: Mood normal.        No results found for any visits  on 10/03/23.  The ASCVD Risk score (Arnett DK, et al., 2019) failed to calculate for the following reasons:   The 2019 ASCVD risk score is only valid for ages 42 to 44     Assessment & Plan:   Hyperlipidemia, unspecified hyperlipidemia type Assessment & Plan: Check lipid panel.  Continue Atorvastatin  10 mg once daily.  Orders: -     Lipid panel -     Atorvastatin  Calcium ; Take 1 tablet (10 mg total) by mouth daily.  Dispense: 90 tablet; Refill: 3  Prediabetes Assessment & Plan: I encouraged continuing with healthy diet. Check A1c.  Orders: -     Hemoglobin A1c  Stage 3b chronic kidney disease (HCC) Assessment & Plan: Reviewed recent renal function.  Repeat BMP.  Briefly discussed considering Farxiga for renal protection.   Orders: -     Basic metabolic panel with GFR  Personal history of colon cancer Assessment & Plan: Discussed risk and benefits of colonoscopy at this time. Given current treatment for breast cancer, patient's age, CKD III, need for bowel preparation, may outweigh the benefit of getting colonoscopy at this time. However, if she develops change in bowel habits, blood in stool may require colonoscopy. I recommend patient also discuss this with her heme/onc to help make decision on diagnostic colonoscopy.    Varicose veins of bilateral lower extremities with other complications Assessment & Plan:  Lifestyle Modifications: Advise on leg elevation, weight management, and regular exercise. Recommend graduated compression stockings, patient does not like compression stockings.    Neuropathy Assessment & Plan: Chronic and stable related to prior chemotherapy.  She has tried Gabapentin in the past which did not improve her symptoms. Continue monitor.    Primary hypertension Assessment & Plan: BP within goal, <130/80 mmHg. On Lasix  20 mg, every other day. I do not recommend  adding antihypertensive medication at this time.    I spent 40 minutes on the day of this face-to-face encounter reviewing the patient's medical and surgical history, medications, ongoing concerns, and reviewing the assessment and plan with the patient. This time also included counseling the patient on their health conditions and management options. Additionally, I spent time post-visit ordering and reviewing diagnostics and therapeutics with the patient.   Return in about 1 year (around 10/02/2024) for Chronic .   Jacklin Mascot, MD

## 2023-10-03 NOTE — Assessment & Plan Note (Addendum)
 Discussed risk and benefits of colonoscopy at this time. Given current treatment for breast cancer, patient's age, CKD III, need for bowel preparation, may outweigh the benefit of getting colonoscopy at this time. However, if she develops change in bowel habits, blood in stool may require colonoscopy. I recommend patient also discuss this with her heme/onc to help make decision on diagnostic colonoscopy.

## 2023-10-03 NOTE — Assessment & Plan Note (Signed)
 Chronic and stable related to prior chemotherapy.  She has tried Gabapentin in the past which did not improve her symptoms. Continue monitor.

## 2023-10-03 NOTE — Assessment & Plan Note (Signed)
 Check lipid panel.  Continue Atorvastatin  10 mg once daily.

## 2023-10-03 NOTE — Assessment & Plan Note (Signed)
 I encouraged continuing with healthy diet. Check A1c.

## 2023-10-04 ENCOUNTER — Ambulatory Visit: Payer: Self-pay

## 2023-10-04 ENCOUNTER — Encounter: Payer: Self-pay | Admitting: Internal Medicine

## 2023-10-04 ENCOUNTER — Other Ambulatory Visit: Payer: Self-pay

## 2023-10-04 LAB — LIPID PANEL
Chol/HDL Ratio: 1.6 ratio (ref 0.0–4.4)
Cholesterol, Total: 225 mg/dL — ABNORMAL HIGH (ref 100–199)
HDL: 145 mg/dL (ref >39)
LDL Chol Calc (NIH): 68 mg/dL (ref 0–99)
Triglycerides: 71 mg/dL (ref 0–149)
VLDL Cholesterol Cal: 12 mg/dL (ref 5–40)

## 2023-10-04 LAB — BASIC METABOLIC PANEL WITH GFR
BUN/Creatinine Ratio: 22 (ref 12–28)
BUN: 27 mg/dL (ref 8–27)
CO2: 20 mmol/L (ref 20–29)
Calcium: 9.8 mg/dL (ref 8.7–10.3)
Chloride: 103 mmol/L (ref 96–106)
Creatinine, Ser: 1.23 mg/dL — ABNORMAL HIGH (ref 0.57–1.00)
Glucose: 103 mg/dL — ABNORMAL HIGH (ref 70–99)
Potassium: 4.6 mmol/L (ref 3.5–5.2)
Sodium: 140 mmol/L (ref 134–144)
eGFR: 44 mL/min/{1.73_m2} — ABNORMAL LOW (ref >59)

## 2023-10-04 LAB — HEMOGLOBIN A1C
Est. average glucose Bld gHb Est-mCnc: 120 mg/dL
Hgb A1c MFr Bld: 5.8 % — ABNORMAL HIGH (ref 4.8–5.6)

## 2023-10-04 NOTE — Progress Notes (Signed)
 Please let the patient know her labs shows stable kidney function, cholesterol and A1c. No change in treatment recommended.   Thank you,  Jacklin Mascot, MD

## 2023-10-11 ENCOUNTER — Other Ambulatory Visit: Payer: Self-pay | Admitting: Internal Medicine

## 2023-10-16 ENCOUNTER — Other Ambulatory Visit: Payer: Self-pay

## 2023-10-16 ENCOUNTER — Other Ambulatory Visit (HOSPITAL_COMMUNITY): Payer: Self-pay

## 2023-10-16 NOTE — Progress Notes (Signed)
 Specialty Pharmacy Refill Coordination Note  Spoke with __.   Sabrina Wilson is a 83 y.o. female contacted today regarding refills of specialty medication(s) Abemaciclib  (VERZENIO )  Patient requested: Delivery   Delivery date: 10/18/23   Verified address: [address]  [place] [address]  Medication will be filled on 10/17/23.

## 2023-10-17 ENCOUNTER — Other Ambulatory Visit: Payer: Self-pay

## 2023-10-18 ENCOUNTER — Encounter (INDEPENDENT_AMBULATORY_CARE_PROVIDER_SITE_OTHER): Payer: Self-pay

## 2023-10-18 ENCOUNTER — Other Ambulatory Visit: Payer: Self-pay

## 2023-10-24 ENCOUNTER — Other Ambulatory Visit: Payer: Self-pay | Admitting: Internal Medicine

## 2023-10-25 ENCOUNTER — Other Ambulatory Visit: Payer: Self-pay

## 2023-10-25 ENCOUNTER — Encounter: Payer: Self-pay | Admitting: Internal Medicine

## 2023-10-31 ENCOUNTER — Inpatient Hospital Stay: Attending: Internal Medicine

## 2023-10-31 ENCOUNTER — Inpatient Hospital Stay

## 2023-10-31 ENCOUNTER — Encounter: Payer: Self-pay | Admitting: Internal Medicine

## 2023-10-31 ENCOUNTER — Inpatient Hospital Stay (HOSPITAL_BASED_OUTPATIENT_CLINIC_OR_DEPARTMENT_OTHER): Admitting: Internal Medicine

## 2023-10-31 VITALS — BP 146/80 | HR 85

## 2023-10-31 VITALS — BP 155/81 | HR 81 | Temp 97.8°F | Resp 16 | Ht 66.0 in | Wt 175.2 lb

## 2023-10-31 DIAGNOSIS — Z86711 Personal history of pulmonary embolism: Secondary | ICD-10-CM | POA: Insufficient documentation

## 2023-10-31 DIAGNOSIS — Z170 Estrogen receptor positive status [ER+]: Secondary | ICD-10-CM | POA: Insufficient documentation

## 2023-10-31 DIAGNOSIS — Z79811 Long term (current) use of aromatase inhibitors: Secondary | ICD-10-CM | POA: Diagnosis not present

## 2023-10-31 DIAGNOSIS — Z86718 Personal history of other venous thrombosis and embolism: Secondary | ICD-10-CM | POA: Insufficient documentation

## 2023-10-31 DIAGNOSIS — C50811 Malignant neoplasm of overlapping sites of right female breast: Secondary | ICD-10-CM | POA: Insufficient documentation

## 2023-10-31 DIAGNOSIS — Z1732 Human epidermal growth factor receptor 2 negative status: Secondary | ICD-10-CM | POA: Insufficient documentation

## 2023-10-31 DIAGNOSIS — Z87891 Personal history of nicotine dependence: Secondary | ICD-10-CM | POA: Diagnosis not present

## 2023-10-31 DIAGNOSIS — M8580 Other specified disorders of bone density and structure, unspecified site: Secondary | ICD-10-CM | POA: Diagnosis not present

## 2023-10-31 DIAGNOSIS — Z1721 Progesterone receptor positive status: Secondary | ICD-10-CM | POA: Diagnosis not present

## 2023-10-31 DIAGNOSIS — Z79899 Other long term (current) drug therapy: Secondary | ICD-10-CM | POA: Insufficient documentation

## 2023-10-31 LAB — CBC WITH DIFFERENTIAL (CANCER CENTER ONLY)
Abs Immature Granulocytes: 0.02 K/uL (ref 0.00–0.07)
Basophils Absolute: 0.1 K/uL (ref 0.0–0.1)
Basophils Relative: 2 %
Eosinophils Absolute: 0.3 K/uL (ref 0.0–0.5)
Eosinophils Relative: 5 %
HCT: 32.2 % — ABNORMAL LOW (ref 36.0–46.0)
Hemoglobin: 11 g/dL — ABNORMAL LOW (ref 12.0–15.0)
Immature Granulocytes: 0 %
Lymphocytes Relative: 17 %
Lymphs Abs: 0.8 K/uL (ref 0.7–4.0)
MCH: 36.2 pg — ABNORMAL HIGH (ref 26.0–34.0)
MCHC: 34.2 g/dL (ref 30.0–36.0)
MCV: 105.9 fL — ABNORMAL HIGH (ref 80.0–100.0)
Monocytes Absolute: 0.6 K/uL (ref 0.1–1.0)
Monocytes Relative: 11 %
Neutro Abs: 3.2 K/uL (ref 1.7–7.7)
Neutrophils Relative %: 65 %
Platelet Count: 188 K/uL (ref 150–400)
RBC: 3.04 MIL/uL — ABNORMAL LOW (ref 3.87–5.11)
RDW: 13.2 % (ref 11.5–15.5)
WBC Count: 5 K/uL (ref 4.0–10.5)
nRBC: 0 % (ref 0.0–0.2)

## 2023-10-31 LAB — CMP (CANCER CENTER ONLY)
ALT: 16 U/L (ref 0–44)
AST: 23 U/L (ref 15–41)
Albumin: 4.1 g/dL (ref 3.5–5.0)
Alkaline Phosphatase: 46 U/L (ref 38–126)
Anion gap: 10 (ref 5–15)
BUN: 22 mg/dL (ref 8–23)
CO2: 23 mmol/L (ref 22–32)
Calcium: 9.9 mg/dL (ref 8.9–10.3)
Chloride: 104 mmol/L (ref 98–111)
Creatinine: 1.45 mg/dL — ABNORMAL HIGH (ref 0.44–1.00)
GFR, Estimated: 36 mL/min — ABNORMAL LOW (ref >60)
Glucose, Bld: 107 mg/dL — ABNORMAL HIGH (ref 70–99)
Potassium: 4.7 mmol/L (ref 3.5–5.1)
Sodium: 137 mmol/L (ref 135–145)
Total Bilirubin: 0.7 mg/dL (ref 0.0–1.2)
Total Protein: 7 g/dL (ref 6.5–8.1)

## 2023-10-31 LAB — VITAMIN D 25 HYDROXY (VIT D DEFICIENCY, FRACTURES): Vit D, 25-Hydroxy: 42.49 ng/mL (ref 30–100)

## 2023-10-31 MED ORDER — ZOLEDRONIC ACID 4 MG/5ML IV CONC
3.0000 mg | Freq: Once | INTRAVENOUS | Status: AC
  Administered 2023-10-31: 3 mg via INTRAVENOUS
  Filled 2023-10-31: qty 3.75

## 2023-10-31 MED ORDER — SODIUM CHLORIDE 0.9 % IV SOLN
Freq: Once | INTRAVENOUS | Status: AC
  Filled 2023-10-31: qty 250

## 2023-10-31 MED ORDER — SODIUM CHLORIDE 0.9% FLUSH
10.0000 mL | Freq: Once | INTRAVENOUS | Status: AC | PRN
  Administered 2023-10-31: 10 mL
  Filled 2023-10-31: qty 10

## 2023-10-31 NOTE — Assessment & Plan Note (Addendum)
#   Right breast cancer -[facility]; ER/PR positive Her 2 Negative ;  G-3; LVI + T2N1.  Stage II [OCT 2023; Dr.Byrnett] s/p Lumpectomy; node positive-Oncotype RS- 35- s/p  adjuvant chemotherapy with Taxotere -Cytoxan  every 3 weeks x4 cycles.  S/p postlumpectomy radiation April 3rd 2024. Poor tolerance to adjuvant abemaciclib  100 mg BID [started second week of June].  # Currently on anastrozole  once a day [April 2024]; AND in JAN 2025- re-started reduced dose at 50 mg BID.  Labs-CBC/chemistries were reviewed with the patient. Stable.   # # Bone health-2023 osteopenic T-score of -1.1.Continue calcium  plus vitamin D .- ON [JAN 2025- #1 of planned- 6] adjuvant Zometa  every 6 months x 3 years. S/p dental evaluation - stable.    Continue adjuvant Zometa - reduce the dose to 3 mg [given GFR 36]   # pain right lower sharp back pain ? Radiation to groin-  worse in last 1-2 weeks. Worse with movement. Just tylenol  x1- improved; has ben using TENS unit. [No Hx of kidney stones]- continue prn tylenol ; if not improved recommend further work up. Reluctant with tramadol.   # COPD [Dr.Gonzalez]-on Breztri /albuterol  -stable.   # [2nd,FEB 2024] CTA-  acute pulmonary embolism/ LEFT LE- Acute DVT-- [provoked sec to chemo]. Stop eliquis  in End of  AUG 2024. Stable.    # CKD- stage III- monitor for now./ APRIL 2025- CA 10.4; ca+vitD on stable.   # Right arm decreased range of motion/swelling- ?  Lymphedema- s/p evaluation- Maureen/OT- stable.   # Vaccination: s/p COVID; Flu; OK with RSV.   # IV Access: PIV   # Zometa  - 7/11-2023 q 6 m [3mg ]  # DISPOSITION: # Zometa  today-  # follow in 5 weeks- MD;labs- cbc/cmp Dr.B

## 2023-10-31 NOTE — Patient Instructions (Signed)

## 2023-10-31 NOTE — Progress Notes (Signed)
  Cancer Center CONSULT NOTE  Patient Care Team: Pcp, No as PCP - General [name], MD as PCP - Cardiology (Cardiology) Byrnett, [name], MD as Consulting Physician (General Surgery) [name], MD (Internal Medicine) [name], RN as Oncology Nurse Navigator [name], MD as Consulting Physician (Radiation Oncology) [name], MD as Consulting Physician (Internal Medicine) [name], DO as Consulting Physician (General Surgery)  CHIEF COMPLAINTS/PURPOSE OF CONSULTATION: Breast cancer  #  Oncology History Overview Note  # 2009- Sigmoid colon cancer stage II (T3 N0 4 lymph nodes sampled M0 stage II high risk there was obstruction and perforation abscess; Dr.Hern), workup included a low CEA preoperatively and a chest x-ray and CT scan abdomen pelvis negative for metastatic disease, S/P colostomy; s/p FOLFOX x 6 months. [Dr.Gittin ]  # LATER REVERSED , K RAS  wild type B RAF not evaluated   Also initial iron deficiency with anemia thrombocytosis, high platelets considered reactive, workup includes a normal PCR for CML and a negative [id number] mutation  DIAGNOSIS:  A. BREAST, RIGHT; LUMPECTOMY:  - INVASIVE MAMMARY CARCINOMA.  - DUCTAL CARCINOMA IN SITU (DCIS).  - SEE CANCER SUMMARY BELOW.  - BIOPSY SITE CHANGE WITH CLIP (2).  - FIBROUS TISSUE WITH CYST FORMATION AND FLORID DUCTAL HYPERPLASIA.  - SKIN AND NIPPLE NEGATIVE FOR MALIGNANCY.  - VASCULAR CALCIFICATION.   B. SENTINEL LYMPH NODES 1 AND 2, RIGHT AXILLA; EXCISION:  - METASTATIC CARCINOMA INVOLVES ONE OF TWO LYMPH NODES (1/2).   C. NON-SENTINEL LYMPH NODES, RIGHT AXILLA; EXCISION:  - ONE LYMPH NODE NEGATIVE FOR MALIGNANCY (0/1).   CANCER CASE SUMMARY: INVASIVE CARCINOMA OF THE BREAST  Standard(s): AJCC-UICC 8   SPECIMEN  Procedure: Lumpectomy  Specimen Laterality: Right   TUMOR  Histologic Type: Invasive carcinoma of no special type (ductal)  Histologic Grade  (Nottingham Histologic Score)       Glandular (Acinar)/Tubular Differentiation: 3       Nuclear Pleomorphism: 3       Mitotic Rate: 3       Overall Grade: 3  Tumor Size: 24 mm  Tumor Focality: Single focus of invasive carcinoma  Ductal Carcinoma In Situ (DCIS): Present, nuclear grade 2-3  Tumor Extent: Not applicable  Lymphatic and/or Vascular Invasion: Present  Treatment Effect in the Breast: No known presurgical therapy   MARGINS  Margin Status for Invasive Carcinoma: All margins negative for invasive  carcinoma       Distance from closest margin: 10 mm       Specify closest margin: Superior   Margin Status for DCIS: All margins negative for DCIS       Distance from DCIS to closest margin: 10 mm       Specify closest margin: Superior   REGIONAL LYMPH NODES  Regional Lymph Node Status: Tumor present in regional lymph node(s)       Number of Lymph Nodes with Macrometastases (greater than 2 mm): 1       Number of Lymph Nodes with Micrometastases (greater than 0.2 mm to  2 mm and/or greater than 200 cells): 0       Number of Lymph Nodes with Isolated Tumor Cells (0.2 mm or less OR  200 cells or less): 0       Size of Largest Metastatic Deposit: 3 mm      Extranodal Extension: Not identified       Total Number of Lymph Nodes Examined (sentinel and non-sentinel): 3  Number of Sentinel Nodes Examined: 2   DISTANT METASTASIS  Distant Site(s) Involved, if applicable: Not applicable   PATHOLOGIC STAGE CLASSIFICATION (pTNM, AJCC 8th Edition):  Modified Classification: Not applicable  pT Category: pT2  T Suffix: Not applicable  pN Category: pN1a  N Suffix: (sn)  pM Category: Not applicable   SPECIAL STUDIES  Breast Biomarker Testing Performed on Previous Outside Biopsy:  23-250-T11  Per outside report:  Estrogen Receptor (ER) Status: POSITIVE          Percentage of cells with nuclear positivity: Greater than 90%          Average intensity of staining: Strong    Progesterone Receptor (PgR) Status: POSITIVE          Percentage of cells with nuclear positivity: 60%          Average intensity of staining: Strong   HER2 (by immunohistochemistry): EQUIVOCAL (Score 2+)  HER2 FISH: NEGATIVE   Ki-67: Not performed   # Cytoxan  Taxotere  x 4 cycles; finished JAN, 2024. [Neutropenic fevers s/p cycle #4;   -FEB 2nd, 2024-right lung PE; left lower LE  DVT- on eliquis .   # FEB 19th-start RT; mpectomy radiation April 3rd 2024.  # anastrozole  once a day [April 2024]-t #  Poor tolerance to adjuvant abemaciclib  100 mg BID [started second week of June] stopped x 1 month.    # JAN 8th 2024- start abemaciclib  50 mg BID       Cancer of sigmoid (HCC) (Resolved)  Carcinoma of overlapping sites of right breast in female, estrogen receptor positive (HCC)  02/14/2022 Initial Diagnosis   Carcinoma of overlapping sites of right breast in female, estrogen receptor positive (HCC)   02/14/2022 Cancer Staging   Staging form: Breast, AJCC 8th Edition - Pathologic: Stage IIA (pT2, pN1, cM0, G3, ER+, PR+, HER2-) - Signed by [name], MD on 02/14/2022 Histologic grading system: 3 grade system   02/23/2022 -  Chemotherapy   Patient is on Treatment Plan : BREAST TC q21d      HISTORY OF PRESENTING ILLNESS:   Alone.  Ambulating independently.   Sabrina Wilson 83 y.o.  female patient with ER/PR positive Her 2 Negative  breast cancer stage II T2 N1-currently  adjuvant anastrozole -and verzinio  [re-started in JAN 2025] is here for a follow up.   Patient noted to gave pain left lower sharp back pain  worse in last 1-2 weeks. Worse with movement. Just tylenol  x1- improved; has ben using TENS unit.   Diarrhea 3-4 times a month. No anti-diarrheal.   Neuropathy in fingertips. Still able to play the piano. Denies pain. Appetite is normal.  Denies any nausea vomiting.  Review of Systems  Constitutional:  Negative for chills, diaphoresis and fever.  HENT:   Negative for nosebleeds and sore throat.   Eyes:  Negative for double vision.  Respiratory:  Negative for hemoptysis, sputum production and wheezing.   Cardiovascular:  Negative for chest pain, palpitations, orthopnea and leg swelling.  Gastrointestinal:  Negative for blood in stool, constipation, diarrhea, heartburn, melena and vomiting.  Genitourinary:  Negative for dysuria, frequency and urgency.  Musculoskeletal:  Positive for back pain and joint pain.  Skin: Negative.  Negative for itching and rash.  Neurological:  Negative for dizziness, tingling, focal weakness, weakness and headaches.  Endo/Heme/Allergies:  Does not bruise/bleed easily.  Psychiatric/Behavioral:  Negative for depression. The patient is not nervous/anxious and does not have insomnia.      MEDICAL HISTORY:  Past Medical  History:  Diagnosis Date   Actinic keratosis 02/12/2020   R lat calf (hypertrophic)   Arthritis    Cancer of sigmoid (HCC) 06/17/2016   Colon cancer (HCC) 2009   T3,N0; s/p resection  Dr. Primus and chemotherapy by Dr. Brooks   Diastolic dysfunction    a. 08/2020 Echo: EF 60-65%, no rwma, Gr1 DD, nl RV size/fxn. Mild MR.   DVT (deep venous thrombosis) (HCC) 05/29/2022   Hernia of flank    History of actinic keratoses 08/11/2020   Bx proven at left lateral ankle proximal, LN2 10/08/20   History of stress test    a. 09/2020 MV: EF >65%. No ischemia/infarct. Mild Ao Ca2+ and minimal Cor Ca2+.   Hypertension    Hypomagnesemia 05/29/2022   Hyponatremia 05/29/2022   Neuropathy    Personal history of radiation therapy 2023   right breast CA   Polyp at cervical os    s/p resection Dr. Cleotilde   Pulmonary embolism (HCC) 05/27/2022   Skin cancer 01/22/2020   surface of an atypical verrucous squamous proliferation    Squamous cell carcinoma of skin 07/16/2020   Left lateral ankle anterior, [facility] with [facility] 08-11-2020    SURGICAL HISTORY: Past Surgical History:  Procedure Laterality Date   BREAST  BIOPSY Right 05/2014   Dr. Fredirick office-benign   BREAST LUMPECTOMY WITH SENTINEL LYMPH NODE BIOPSY Right 01/21/2022   Procedure: BREAST LUMPECTOMY WITH SENTINEL LYMPH NODE BX;  Surgeon: Dessa [name], MD;  Location: [facility] ORS;  Service: General;  Laterality: Right;   BREAST SURGERY Right February 2016   Vacuum assisted biopsy for recurrent cyst, fibrocystic changes without evidence of malignancy.   CATARACT EXTRACTION W/PHACO Left 01/13/2015   Procedure: CATARACT EXTRACTION PHACO AND INTRAOCULAR LENS PLACEMENT (IOC);  Surgeon: Elsie Carmine, MD;  Location: [facility] ORS;  Service: Ophthalmology;  Laterality: Left;  US   1:02.9 AP   19.2 CDE  12.06 casette lot #  [id number]   CATARACT EXTRACTION W/PHACO Right 02/03/2015   Procedure: CATARACT EXTRACTION PHACO AND INTRAOCULAR LENS PLACEMENT (IOC);  Surgeon: Elsie Carmine, MD;  Location: [facility] ORS;  Service: Ophthalmology;  Laterality: Right;  [id number]:53 ap46.5 [id number]   COLECTOMY     COLONOSCOPY  2015   Dr Ora   COLONOSCOPY WITH PROPOFOL  N/A 09/13/2017   Procedure: COLONOSCOPY WITH PROPOFOL ;  Surgeon: Dessa [name], MD;  Location: [facility] ENDOSCOPY;  Service: Endoscopy;  Laterality: N/A;   COLONOSCOPY WITH PROPOFOL  N/A 11/08/2017   Procedure: COLONOSCOPY WITH PROPOFOL ;  Surgeon: Dessa [name], MD;  Location: [facility] ENDOSCOPY;  Service: Endoscopy;  Laterality: N/A;   colostomy reversal     DILATION AND CURETTAGE OF UTERUS  2014   Dr. Cleotilde, benign per pt   EYE SURGERY     HERNIA REPAIR  07/13/12   ventral    SKIN CANCER EXCISION     TONSILLECTOMY      SOCIAL HISTORY: Social History   Socioeconomic History   Marital status: Married    Spouse name: Not on file   Number of children: Not on file   Years of education: Not on file   Highest education level: Not on file  Occupational History   Not on file  Tobacco Use   Smoking status: Former    Current packs/day: 0.00    Types: Cigarettes    Quit date: 10/12/2007    Years since  quitting: 16.0   Smokeless tobacco: Never  Vaping Use   Vaping status: Never Used  Substance and Sexual Activity  Alcohol use: Yes    Alcohol/week: 1.0 standard drink of alcohol    Types: 1 Standard drinks or equivalent per week    Comment: with dinner GLASS OF WINE EACH DAY   Drug use: No   Sexual activity: Not on file  Other Topics Concern   Not on file  Social History Narrative   Lives in [place] with husband. 2 children, son lives nearby.Diet - regularExercise - none. 30 mins/in [place]. Quit smoking in 2009. Wine before dinner. Pianist in church; real estate; Diplomatic Services operational officer.    Social Drivers of Corporate investment banker Strain: Low Risk  (11/21/2022)   Overall Financial Resource Strain (CARDIA)    Difficulty of Paying Living Expenses: Not hard at all  Food Insecurity: No Food Insecurity (11/21/2022)   Hunger Vital Sign    Worried About Running Out of Food in the Last Year: Never true    Ran Out of Food in the Last Year: Never true  Transportation Needs: No Transportation Needs (11/21/2022)   PRAPARE - Administrator, Civil Service (Medical): No    Lack of Transportation (Non-Medical): No  Physical Activity: Inactive (11/21/2022)   Exercise Vital Sign    Days of Exercise per Week: 0 days    Minutes of Exercise per Session: 0 min  Stress: No Stress Concern Present (11/21/2022)   [employer] of Occupational Health - Occupational Stress Questionnaire    Feeling of Stress : Not at all  Social Connections: Socially Integrated (11/21/2022)   Social Connection and Isolation Panel    Frequency of Communication with Friends and Family: More than three times a week    Frequency of Social Gatherings with Friends and Family: More than three times a week    Attends Religious Services: More than 4 times per year    Active Member of [employer] or Organizations: Yes    Attends Engineer, structural: More than 4 times per year    Marital Status: Married  Careers information officer Violence: Not At Risk (11/21/2022)   Humiliation, Afraid, Rape, and Kick questionnaire    Fear of Current or Ex-Partner: No    Emotionally Abused: No    Physically Abused: No    Sexually Abused: No    FAMILY HISTORY: Family History  Problem Relation Age of Onset   Stroke Mother    Diabetes Mother    Colon cancer Father        dx 56s   Stroke Sister    Colon cancer Brother        dx 46s-70s   Brain cancer Brother        dx 60s-70s   Breast cancer Neg Hx     ALLERGIES:  is allergic to sulfa antibiotics.  MEDICATIONS:  Current Outpatient Medications  Medication Sig Dispense Refill   abemaciclib  (VERZENIO ) 50 MG tablet Take 1 tablet (50 mg total) by mouth 2 (two) times daily. 56 tablet 3   anastrozole  (ARIMIDEX ) 1 MG tablet TAKE 1 TABLET BY MOUTH DAILY 90 tablet 0   atorvastatin  (LIPITOR) 10 MG tablet Take 1 tablet (10 mg total) by mouth daily. 90 tablet 3   budesonide-glycopyrrolate -formoterol (BREZTRI  AEROSPHERE) 160-9-4.8 MCG/ACT AERO inhaler Inhale 2 puffs into the lungs in the morning and at bedtime. 1 each 11   furosemide  (LASIX ) 20 MG tablet TAKE 1 TABLET BY MOUTH EVERY OTHER DAY 30 tablet 1   halobetasol  (ULTRAVATE ) 0.05 % cream Apply topically to aa's of rash at body twice daily on  weekends only as needed. Avoid applying to face, groin, and axilla. Use as directed. 50 g 1   KLOR-CON  M20 20 MEQ tablet TAKE 1 TABLET BY MOUTH EVERY OTHER DAY 30 tablet 0   loratadine (CLARITIN) 10 MG tablet Take 10 mg by mouth daily.     niacinamide 500 MG tablet Take 500 mg by mouth 2 (two) times daily.     Omega-3 Fatty Acids (FISH OIL PO) Take 1 capsule by mouth daily.     Spacer/Aero-Holding Chambers (AEROCHAMBER MV) inhaler Use as instructed 1 each 0   tacrolimus  (PROTOPIC ) 0.1 % ointment Apply topically to affected areas of rash twice daily on week days only prn 60 g 1   No current facility-administered medications for this visit.   PHYSICAL EXAMINATION:   Vitals:    10/31/23 1304 10/31/23 1328  BP: (!) 159/85 (!) 155/81  Pulse: 81   Resp: 16   Temp: 97.8 F (36.6 C)   SpO2: 100%     Filed Weights   10/31/23 1304  Weight: 175 lb 3.2 oz (79.5 kg)    Physical Exam Vitals and nursing note reviewed.  HENT:     Head: Normocephalic and atraumatic.     Mouth/Throat:     Pharynx: Oropharynx is clear.  Eyes:     Extraocular Movements: Extraocular movements intact.     Pupils: Pupils are equal, round, and reactive to light.  Cardiovascular:     Rate and Rhythm: Normal rate and regular rhythm.     Heart sounds: Murmur heard.  Pulmonary:     Comments: Decreased breath sounds bilaterally.  Abdominal:     Palpations: Abdomen is soft.  Musculoskeletal:        General: Normal range of motion.     Cervical back: Normal range of motion.  Skin:    General: Skin is warm.  Neurological:     General: No focal deficit present.     Mental Status: She is alert and oriented to person, place, and time.  Psychiatric:        Behavior: Behavior normal.        Judgment: Judgment normal.      LABORATORY DATA:  I have reviewed the data as listed Lab Results  Component Value Date   WBC 5.0 10/31/2023   HGB 11.0 (L) 10/31/2023   HCT 32.2 (L) 10/31/2023   MCV 105.9 (H) 10/31/2023   PLT 188 10/31/2023   Recent Labs    08/23/23 1318 09/26/23 1441 10/03/23 1544 10/31/23 1259  NA 136 137 140 137  K 4.2 3.9 4.6 4.7  CL 102 103 103 104  CO2 24 25 20 23   GLUCOSE 110* 111* 103* 107*  BUN 27* 27* 27 22  CREATININE 1.45* 1.24* 1.23* 1.45*  CALCIUM  9.3 9.9 9.8 9.9  GFRNONAA 36* 44*  --  36*  PROT 6.8 7.1  --  7.0  ALBUMIN 4.3 4.3  --  4.1  AST 22 22  --  23  ALT 15 14  --  16  ALKPHOS 39 44  --  46  BILITOT 0.6 0.6  --  0.7    RADIOGRAPHIC STUDIES: I have personally reviewed the radiological images as listed and agreed with the findings in the report. No results found.  ASSESSMENT & PLAN:   Carcinoma of overlapping sites of right breast in  female, estrogen receptor positive (HCC) # Right breast cancer -[facility]; ER/PR positive Her 2 Negative ;  G-3; LVI + T2N1.  Stage II [OCT  2023; Dr.Byrnett] s/p Lumpectomy; node positive-Oncotype RS- 35- s/p  adjuvant chemotherapy with Taxotere -Cytoxan  every 3 weeks x4 cycles.  S/p postlumpectomy radiation April 3rd 2024. Poor tolerance to adjuvant abemaciclib  100 mg BID [started second week of June].  # Currently on anastrozole  once a day [April 2024]; AND in JAN 2025- re-started reduced dose at 50 mg BID.  Labs-CBC/chemistries were reviewed with the patient. Stable.   # # Bone health-2023 osteopenic T-score of -1.1.Continue calcium  plus vitamin D .- ON [JAN 2025- #1 of planned- 6] adjuvant Zometa  every 6 months x 3 years. S/p dental evaluation - stable.    Continue adjuvant Zometa - reduce the dose to 3 mg [given GFR 36]   # pain right lower sharp back pain ? Radiation to groin-  worse in last 1-2 weeks. Worse with movement. Just tylenol  x1- improved; has ben using TENS unit. [No Hx of kidney stones]- continue prn tylenol ; if not improved recommend further work up. Reluctant with tramadol.   # COPD [Dr.Gonzalez]-on Breztri /albuterol  -stable.   # [2nd,FEB 2024] CTA-  acute pulmonary embolism/ LEFT LE- Acute DVT-- [provoked sec to chemo]. Stop eliquis  in End of  AUG 2024. Stable.    # CKD- stage III- monitor for now./ APRIL 2025- CA 10.4; ca+vitD on stable.   # Right arm decreased range of motion/swelling- ?  Lymphedema- s/p evaluation- Maureen/OT- stable.   # Vaccination: s/p COVID; Flu; OK with RSV.   # IV Access: PIV   # Zometa  - 7/11-2023 q 6 m [3mg ]  # DISPOSITION: # Zometa  today-  # follow in 5 weeks- MD;labs- cbc/cmp Dr.B      All questions were answered. The patient/family knows to call the clinic with any problems, questions or concerns.    Cindy JONELLE Joe, MD 10/31/2023 2:07 PM

## 2023-10-31 NOTE — Progress Notes (Signed)
 C/o having a different type of sharp back pain, when moving x2 weeks, 7/10.

## 2023-11-01 ENCOUNTER — Other Ambulatory Visit: Payer: Self-pay

## 2023-11-01 NOTE — Progress Notes (Signed)
 Specialty Pharmacy Ongoing Clinical Assessment Note  Sabrina Wilson is a 83 y.o. female who is being followed by the specialty pharmacy service for RxSp Oncology   Patient's specialty medication(s) reviewed today: Abemaciclib  (VERZENIO )   Missed doses in the last 4 weeks: 0   Patient/Caregiver did not have any additional questions or concerns.   Therapeutic benefit summary: Patient is achieving benefit   Adverse events/side effects summary: Experienced adverse events/side effects (tolerable, intermittent diarrhea. no anti-diarrheal needed)   Patient's therapy is appropriate to: Continue    Goals Addressed             This Visit's Progress    Achieve or maintain remission   On track    Patient is on track. Patient will maintain adherence.          Follow up: 6 months  Sabrina Wilson M Daxx [facility] Specialty Pharmacist

## 2023-11-10 ENCOUNTER — Other Ambulatory Visit: Payer: Self-pay | Admitting: Pharmacy Technician

## 2023-11-10 ENCOUNTER — Other Ambulatory Visit: Payer: Self-pay

## 2023-11-10 NOTE — Progress Notes (Signed)
 Specialty Pharmacy Refill Coordination Note  Sabrina Wilson is a 83 y.o. female contacted today regarding refills of specialty medication(s) Abemaciclib  (VERZENIO )   Patient requested Delivery   Delivery date: 11/14/23   Verified address: [address]  [place]   Medication will be filled on 11/13/23.

## 2023-11-13 ENCOUNTER — Other Ambulatory Visit: Payer: Self-pay

## 2023-11-27 ENCOUNTER — Ambulatory Visit (INDEPENDENT_AMBULATORY_CARE_PROVIDER_SITE_OTHER): Payer: Medicare Other | Admitting: *Deleted

## 2023-11-27 VITALS — Ht 66.0 in | Wt 175.0 lb

## 2023-11-27 DIAGNOSIS — Z0000 Encounter for general adult medical examination without abnormal findings: Secondary | ICD-10-CM | POA: Diagnosis not present

## 2023-11-27 NOTE — Progress Notes (Signed)
 Subjective:   Sabrina Wilson is a 83 y.o. who presents for a Medicare Wellness preventive visit.  As a reminder, Annual Wellness Visits don't include a physical exam, and some assessments may be limited, especially if this visit is performed virtually. We may recommend an in-person follow-up visit with your provider if needed.  Visit Complete: Virtual I connected with  Sabrina Wilson on 11/27/23 by a audio enabled telemedicine application and verified that I am speaking with the correct person using two identifiers.  Patient Location: Home  Provider Location: Home Office  I discussed the limitations of evaluation and management by telemedicine. The patient expressed understanding and agreed to proceed.  Vital Signs: Because this visit was a virtual/telehealth visit, some criteria may be missing or patient reported. Any vitals not documented were not able to be obtained and vitals that have been documented are patient reported.  VideoDeclined- This patient declined Librarian, academic. Therefore the visit was completed with audio only.  Persons Participating in Visit: Patient.  AWV Questionnaire: Yes: Patient Medicare AWV questionnaire was completed by the patient on 11/23/23; I have confirmed that all information answered by patient is correct and no changes since this date.  Cardiac Risk Factors include: advanced age (>63men, >69 women);dyslipidemia;hypertension     Objective:    Today's Vitals   11/27/23 1335  Weight: 175 lb (79.4 kg)  Height: 5' 6 (1.676 m)   Body mass index is 28.25 kg/m.     11/27/2023    1:53 PM 10/31/2023    1:04 PM 10/02/2023    2:07 PM 09/26/2023    3:04 PM 09/26/2023    2:25 PM 08/23/2023    1:07 PM 07/26/2023    2:31 PM  Advanced Directives  Does Patient Have a Medical Advance Directive? Yes Yes Yes Yes Yes Yes Yes  Type of Estate agent of [place];Living will Healthcare Power of [place];Living will  Healthcare Power of [place];Living will Healthcare Power of [place];Living will Healthcare Power of [place];Living will Healthcare Power of [place];Living will Healthcare Power of [place];Living will  Does patient want to make changes to medical advance directive?   No - Patient declined      Copy of Healthcare Power of Attorney in Chart? No - copy requested No - copy requested No - copy requested No - copy requested No - copy requested  Yes - validated most recent copy scanned in chart (See row information)  Would patient like information on creating a medical advance directive?   No - Patient declined        Current Medications (verified) Outpatient Encounter Medications as of 11/27/2023  Medication Sig   abemaciclib  (VERZENIO ) 50 MG tablet Take 1 tablet (50 mg total) by mouth 2 (two) times daily.   anastrozole  (ARIMIDEX ) 1 MG tablet TAKE 1 TABLET BY MOUTH DAILY   atorvastatin  (LIPITOR) 10 MG tablet Take 1 tablet (10 mg total) by mouth daily.   budesonide-glycopyrrolate -formoterol (BREZTRI  AEROSPHERE) 160-9-4.8 MCG/ACT AERO inhaler Inhale 2 puffs into the lungs in the morning and at bedtime.   furosemide  (LASIX ) 20 MG tablet TAKE 1 TABLET BY MOUTH EVERY OTHER DAY   halobetasol  (ULTRAVATE ) 0.05 % cream Apply topically to aa's of rash at body twice daily on weekends only as needed. Avoid applying to face, groin, and axilla. Use as directed.   KLOR-CON  M20 20 MEQ tablet TAKE 1 TABLET BY MOUTH EVERY OTHER DAY   loratadine (CLARITIN) 10 MG tablet Take 10 mg by mouth  daily.   niacinamide 500 MG tablet Take 500 mg by mouth 2 (two) times daily.   Omega-3 Fatty Acids (FISH OIL PO) Take 1 capsule by mouth daily.   Spacer/Aero-Holding Chambers (AEROCHAMBER MV) inhaler Use as instructed   tacrolimus  (PROTOPIC ) 0.1 % ointment Apply topically to affected areas of rash twice daily on week days only prn   No facility-administered encounter medications on file as of 11/27/2023.    Allergies  (verified) Sulfa antibiotics   History: Past Medical History:  Diagnosis Date   Actinic keratosis 02/12/2020   R lat calf (hypertrophic)   Arthritis    Cancer of sigmoid (HCC) 06/17/2016   Colon cancer (HCC) 2009   T3,N0; s/p resection  Dr. Primus and chemotherapy by Dr. Brooks   Diastolic dysfunction    a. 08/2020 Echo: EF 60-65%, no rwma, Gr1 DD, nl RV size/fxn. Mild MR.   DVT (deep venous thrombosis) (HCC) 05/29/2022   Hernia of flank    History of actinic keratoses 08/11/2020   Bx proven at left lateral ankle proximal, LN2 10/08/20   History of stress test    a. 09/2020 MV: EF >65%. No ischemia/infarct. Mild Ao Ca2+ and minimal Cor Ca2+.   Hypertension    Hypomagnesemia 05/29/2022   Hyponatremia 05/29/2022   Neuropathy    Personal history of radiation therapy 2023   right breast CA   Polyp at cervical os    s/p resection Dr. Cleotilde   Pulmonary embolism (HCC) 05/27/2022   Skin cancer 01/22/2020   surface of an atypical verrucous squamous proliferation    Squamous cell carcinoma of skin 07/16/2020   Left lateral ankle anterior, treated with [facility] 08-11-2020   Past Surgical History:  Procedure Laterality Date   BREAST BIOPSY Right 05/2014   Dr. Fredirick office-benign   BREAST LUMPECTOMY WITH SENTINEL LYMPH NODE BIOPSY Right 01/21/2022   Procedure: BREAST LUMPECTOMY WITH SENTINEL LYMPH NODE BX;  Surgeon: Dessa [name], MD;  Location: [facility] ORS;  Service: General;  Laterality: Right;   BREAST SURGERY Right February 2016   Vacuum assisted biopsy for recurrent cyst, fibrocystic changes without evidence of malignancy.   CATARACT EXTRACTION W/PHACO Left 01/13/2015   Procedure: CATARACT EXTRACTION PHACO AND INTRAOCULAR LENS PLACEMENT (IOC);  Surgeon: Elsie Carmine, MD;  Location: [facility] ORS;  Service: Ophthalmology;  Laterality: Left;  US   1:02.9 AP   19.2 CDE  12.06 casette lot #  [id number]   CATARACT EXTRACTION W/PHACO Right 02/03/2015   Procedure: CATARACT EXTRACTION PHACO AND  INTRAOCULAR LENS PLACEMENT (IOC);  Surgeon: Elsie Carmine, MD;  Location: [facility] ORS;  Service: Ophthalmology;  Laterality: Right;  [id number]:53 ap46.5 [id number]   COLECTOMY     COLONOSCOPY  2015   Dr Ora   COLONOSCOPY WITH PROPOFOL  N/A 09/13/2017   Procedure: COLONOSCOPY WITH PROPOFOL ;  Surgeon: Dessa [name], MD;  Location: [facility] ENDOSCOPY;  Service: Endoscopy;  Laterality: N/A;   COLONOSCOPY WITH PROPOFOL  N/A 11/08/2017   Procedure: COLONOSCOPY WITH PROPOFOL ;  Surgeon: Dessa [name], MD;  Location: [facility] ENDOSCOPY;  Service: Endoscopy;  Laterality: N/A;   colostomy reversal     DILATION AND CURETTAGE OF UTERUS  2014   Dr. Cleotilde, benign per pt   EYE SURGERY     HERNIA REPAIR  07/13/12   ventral    SKIN CANCER EXCISION     TONSILLECTOMY     Family History  Problem Relation Age of Onset   Stroke Mother    Diabetes Mother    Colon cancer Father  dx 11s   Stroke Sister    Colon cancer Brother        dx 65s-70s   Brain cancer Brother        dx 60s-70s   Breast cancer Neg Hx    Social History   Socioeconomic History   Marital status: Married    Spouse name: Not on file   Number of children: Not on file   Years of education: Not on file   Highest education level: 12th grade  Occupational History   Not on file  Tobacco Use   Smoking status: Former    Current packs/day: 0.00    Types: Cigarettes    Quit date: 10/12/2007    Years since quitting: 16.1   Smokeless tobacco: Never  Vaping Use   Vaping status: Never Used  Substance and Sexual Activity   Alcohol use: Yes    Alcohol/week: 1.0 standard drink of alcohol    Types: 1 Standard drinks or equivalent per week    Comment: with dinner GLASS OF WINE EACH DAY   Drug use: No   Sexual activity: Not on file  Other Topics Concern   Not on file  Social History Narrative   Lives in [place] with husband. 2 children, son lives nearby.Diet - regularExercise - none. 30 mins/in [place]. Quit smoking in 2009. Wine  before dinner. Pianist in church; real estate; Diplomatic Services operational officer.    Social Drivers of Corporate investment banker Strain: Low Risk  (11/23/2023)   Overall Financial Resource Strain (CARDIA)    Difficulty of Paying Living Expenses: Not hard at all  Food Insecurity: No Food Insecurity (11/23/2023)   Hunger Vital Sign    Worried About Running Out of Food in the Last Year: Never true    Ran Out of Food in the Last Year: Never true  Transportation Needs: No Transportation Needs (11/23/2023)   PRAPARE - Administrator, Civil Service (Medical): No    Lack of Transportation (Non-Medical): No  Physical Activity: Insufficiently Active (11/23/2023)   Exercise Vital Sign    Days of Exercise per Week: 1 day    Minutes of Exercise per Session: 30 min  Stress: No Stress Concern Present (11/23/2023)   [employer] of Occupational Health - Occupational Stress Questionnaire    Feeling of Stress: Not at all  Social Connections: Socially Integrated (11/23/2023)   Social Connection and Isolation Panel    Frequency of Communication with Friends and Family: More than three times a week    Frequency of Social Gatherings with Friends and Family: Three times a week    Attends Religious Services: More than 4 times per year    Active Member of Clubs or Organizations: Yes    Attends Engineer, structural: More than 4 times per year    Marital Status: Married    Tobacco Counseling Counseling given: Not Answered    Clinical Intake:  Pre-visit preparation completed: Yes  Pain : No/denies pain     BMI - recorded: 28.25 Nutritional Status: BMI 25 -29 Overweight Nutritional Risks: Nausea/ vomitting/ diarrhea (off and on as a side effect from medication) Diabetes: No  Lab Results  Component Value Date   HGBA1C 5.8 (H) 10/03/2023   HGBA1C 5.8 (A) 11/24/2021   HGBA1C 6.2 05/24/2021     How often do you need to have someone help you when you read instructions, pamphlets, or other  written materials from your doctor or pharmacy?: 1 - Never  Interpreter Needed?:  No  Information entered by :: R. Ashyah Quizon LPN   Activities of Daily Living     11/23/2023   11:17 AM  In your present state of health, do you have any difficulty performing the following activities:  Hearing? 0  Vision? 0  Difficulty concentrating or making decisions? 0  Walking or climbing stairs? 0  Dressing or bathing? 0  Doing errands, shopping? 0  Preparing Food and eating ? N  Using the Toilet? N  In the past six months, have you accidently leaked urine? N  Do you have problems with loss of bowel control? N  Managing your Medications? N  Managing your Finances? N  Housekeeping or managing your Housekeeping? N    Patient Care Team: Bair, Kalpana, MD as PCP - General (Family Medicine) [name], MD as PCP - Cardiology (Cardiology) Dessa [name], MD as Consulting Physician (General Surgery) [name], MD (Internal Medicine) [name], RN as Oncology Nurse Navigator [name], MD as Consulting Physician (Radiation Oncology) [name], MD as Consulting Physician (Internal Medicine) [name], DO as Consulting Physician (General Surgery)  I have updated your Care Teams any recent Medical Services you may have received from other providers in the past year.     Assessment:   This is a routine wellness examination for Sabrina Wilson.  Hearing/Vision screen Hearing Screening - Comments:: No issues Vision Screening - Comments:: glasses   Goals Addressed             This Visit's Progress    Patient Stated       Wants to get off of a medication that makes her feel bad       Depression Screen     11/27/2023    1:46 PM 10/03/2023    3:04 PM 11/21/2022    3:53 PM 06/03/2022   10:49 AM 02/11/2022    3:40 PM 11/24/2021   10:36 AM 05/24/2021    2:26 PM  PHQ 2/9 Scores  PHQ - 2 Score 0 0 0 0 0 0 0  PHQ- 9 Score 0 0 3        Fall Risk     11/23/2023    11:17 AM 10/03/2023    3:04 PM 11/21/2022    3:42 PM 06/03/2022   10:48 AM 11/24/2021   10:36 AM  Fall Risk   Falls in the past year? 1 1 1  0 0  Number falls in past yr: 0 0 0 0 0  Injury with Fall? 0 0 0 0 0  Risk for fall due to : History of fall(s);Impaired balance/gait History of fall(s) History of fall(s);Impaired balance/gait Impaired balance/gait No Fall Risks  Follow up Falls evaluation completed;Falls prevention discussed Falls evaluation completed Falls evaluation completed;Education provided Falls evaluation completed Falls evaluation completed      Data saved with a previous flowsheet row definition    MEDICARE RISK AT HOME:  Medicare Risk at Home Any stairs in or around the home?: (Patient-Rptd) Yes If so, are there any without handrails?: No Home free of loose throw rugs in walkways, pet beds, electrical cords, etc?: (Patient-Rptd) Yes Adequate lighting in your home to reduce risk of falls?: (Patient-Rptd) Yes Life alert?: (Patient-Rptd) No Use of a cane, walker or w/c?: (Patient-Rptd) No Grab bars in the bathroom?: (Patient-Rptd) Yes Shower chair or bench in shower?: (Patient-Rptd) Yes Elevated toilet seat or a handicapped toilet?: (Patient-Rptd) Yes  TIMED UP AND GO:  Was the test performed?  No  Cognitive  Function: 6CIT completed    11/14/2019   11:25 AM  MMSE - Mini Mental State Exam  Not completed: Unable to complete        11/27/2023    1:53 PM 11/21/2022    4:00 PM 11/13/2018   11:18 AM  6CIT Screen  What Year? 0 points 0 points 0 points  What month? 0 points 0 points 0 points  What time? 0 points 0 points 0 points  Count back from 20 0 points 0 points 0 points  Months in reverse 0 points 4 points 0 points  Repeat phrase 0 points 0 points 0 points  Total Score 0 points 4 points 0 points    Immunizations Immunization History  Administered Date(s) Administered   Fluad Quad(high Dose 65+) 12/19/2018, 12/21/2018, 01/14/2020, 01/12/2022   Hepb-cpg  12/11/2019, 01/14/2020   Influenza Split 01/19/2011, 03/26/2012   Influenza, High Dose Seasonal PF 02/03/2014, 02/07/2017, 01/31/2018, 01/18/2021   Influenza,inj,Quad PF,6+ Mos 02/07/2013, 01/22/2015, 02/01/2016   Influenza-Unspecified 02/06/2014, 12/22/2022   Moderna Covid-19 Vaccine Bivalent Booster 39yrs & up 01/18/2021, 09/01/2021, 01/23/2022   Moderna Sars-Covid-2 Vaccination 05/03/2019, 05/31/2019, 02/17/2020, 08/11/2020   Pneumococcal Conjugate-13 09/27/2013   Pneumococcal Polysaccharide-23 04/12/2009   Respiratory Syncytial Virus Vaccine ,Recomb Aduvanted(Arexvy ) 01/12/2022   Tdap 11/26/2019   Unspecified SARS-COV-2 Vaccination 12/22/2022   Zoster Recombinant(Shingrix) 06/11/2018, 08/30/2018   Zoster, Live 02/03/2011    Screening Tests Health Maintenance  Topic Date Due   COVID-19 Vaccine (9 - Moderna risk 2024-25 season) 06/23/2023   Medicare Annual Wellness (AWV)  11/21/2023   INFLUENZA VACCINE  11/24/2023   MAMMOGRAM  03/27/2024   DTaP/Tdap/Td (2 - Td or Tdap) 11/25/2029   Pneumococcal Vaccine: 50+ Years  Completed   Hepatitis B Vaccines  Completed   DEXA SCAN  Completed   Zoster Vaccines- Shingrix  Completed   HPV VACCINES  Aged Out   Meningococcal B Vaccine  Aged Out   Colonoscopy  Discontinued   Hepatitis C Screening  Discontinued    Health Maintenance  Health Maintenance Due  Topic Date Due   COVID-19 Vaccine (9 - Moderna risk 2024-25 season) 06/23/2023   Medicare Annual Wellness (AWV)  11/21/2023   INFLUENZA VACCINE  11/24/2023   Health Maintenance Items Addressed: Discussed the need to update flu and covid vaccines annually  Additional Screening:  Vision Screening: Recommended annual ophthalmology exams for early detection of glaucoma and other disorders of the eye. Overdue   [facility]  Has an appointment scheduled 12/22/23 Would you like a referral to an eye doctor? No    Dental Screening: Recommended annual dental exams for proper oral  hygiene  Community Resource Referral / Chronic Care Management: CRR required this visit?  No   CCM required this visit?  No   Plan:    I have personally reviewed and noted the following in the patient's chart:   Medical and social history Use of alcohol, tobacco or illicit drugs  Current medications and supplements including opioid prescriptions. Patient is not currently taking opioid prescriptions. Functional ability and status Nutritional status Physical activity Advanced directives List of other physicians Hospitalizations, surgeries, and ER visits in previous 12 months Vitals Screenings to include cognitive, depression, and falls Referrals and appointments  In addition, I have reviewed and discussed with patient certain preventive protocols, quality metrics, and best practice recommendations. A written personalized care plan for preventive services as well as general preventive health recommendations were provided to patient.   Angeline Fredericks, LPN   04/29/7972   After  Visit Summary: (MyChart) Due to this being a telephonic visit, the after visit summary with patients personalized plan was offered to patient via MyChart   Notes: Nothing significant to report at this time.

## 2023-11-27 NOTE — Patient Instructions (Signed)
 Sabrina Wilson , Thank you for taking time out of your busy schedule to complete your Annual Wellness Visit with me. I enjoyed our conversation and look forward to speaking with you again next year. I, as well as your care team,  appreciate your ongoing commitment to your health goals. Please review the following plan we discussed and let me know if I can assist you in the future. Your Game plan/ To Do List    Referrals: If you haven't heard from the office you've been referred to, please reach out to them at the phone provided.  Remember to update your flu and covid vaccines annually  Follow up Visits: We will see or speak with you next year for your Next Medicare AWV with our clinical staff 11/27/24 @ 1:40 Have you seen your provider in the last 6 months (3 months if uncontrolled diabetes)? Yes  Clinician Recommendations:  Aim for 30 minutes of exercise or brisk walking, 6-8 glasses of water, and 5 servings of fruits and vegetables each day.       This is a list of the screenings recommended for you:  Health Maintenance  Topic Date Due   COVID-19 Vaccine (10 - 2024-25 season) 09/10/2023   Flu Shot  11/24/2023   Mammogram  03/27/2024   Medicare Annual Wellness Visit  11/26/2024   DTaP/Tdap/Td vaccine (2 - Td or Tdap) 11/25/2029   Pneumococcal Vaccine for age over 20  Completed   Hepatitis B Vaccine  Completed   DEXA scan (bone density measurement)  Completed   Zoster (Shingles) Vaccine  Completed   HPV Vaccine  Aged Out   Meningitis B Vaccine  Aged Out   Colon Cancer Screening  Discontinued   Hepatitis C Screening  Discontinued    Advanced directives: (Copy Requested) Please bring a copy of your health care power of attorney and living will to the office to be added to your chart at your convenience. You can mail to [facility] [address] [place], [place] [address] or email to [email or web address] Advance Care Planning is important because it:  [x]  Makes sure  you receive the medical care that is consistent with your values, goals, and preferences  [x]  It provides guidance to your family and loved ones and reduces their decisional burden about whether or not they are making the right decisions based on your wishes.

## 2023-11-28 ENCOUNTER — Other Ambulatory Visit: Payer: Self-pay

## 2023-12-05 ENCOUNTER — Encounter: Payer: Self-pay | Admitting: Internal Medicine

## 2023-12-05 ENCOUNTER — Inpatient Hospital Stay: Attending: Internal Medicine

## 2023-12-05 ENCOUNTER — Inpatient Hospital Stay (HOSPITAL_BASED_OUTPATIENT_CLINIC_OR_DEPARTMENT_OTHER): Admitting: Internal Medicine

## 2023-12-05 VITALS — BP 156/90 | HR 92 | Temp 98.7°F | Resp 18 | Ht 66.0 in | Wt 172.7 lb

## 2023-12-05 DIAGNOSIS — Z170 Estrogen receptor positive status [ER+]: Secondary | ICD-10-CM | POA: Insufficient documentation

## 2023-12-05 DIAGNOSIS — Z86711 Personal history of pulmonary embolism: Secondary | ICD-10-CM | POA: Diagnosis not present

## 2023-12-05 DIAGNOSIS — M8580 Other specified disorders of bone density and structure, unspecified site: Secondary | ICD-10-CM | POA: Insufficient documentation

## 2023-12-05 DIAGNOSIS — Z86718 Personal history of other venous thrombosis and embolism: Secondary | ICD-10-CM | POA: Insufficient documentation

## 2023-12-05 DIAGNOSIS — Z1732 Human epidermal growth factor receptor 2 negative status: Secondary | ICD-10-CM | POA: Insufficient documentation

## 2023-12-05 DIAGNOSIS — I129 Hypertensive chronic kidney disease with stage 1 through stage 4 chronic kidney disease, or unspecified chronic kidney disease: Secondary | ICD-10-CM | POA: Insufficient documentation

## 2023-12-05 DIAGNOSIS — C50811 Malignant neoplasm of overlapping sites of right female breast: Secondary | ICD-10-CM | POA: Diagnosis not present

## 2023-12-05 DIAGNOSIS — N183 Chronic kidney disease, stage 3 unspecified: Secondary | ICD-10-CM | POA: Insufficient documentation

## 2023-12-05 DIAGNOSIS — Z87891 Personal history of nicotine dependence: Secondary | ICD-10-CM | POA: Insufficient documentation

## 2023-12-05 DIAGNOSIS — Z79811 Long term (current) use of aromatase inhibitors: Secondary | ICD-10-CM | POA: Insufficient documentation

## 2023-12-05 DIAGNOSIS — Z1721 Progesterone receptor positive status: Secondary | ICD-10-CM | POA: Insufficient documentation

## 2023-12-05 LAB — CBC WITH DIFFERENTIAL (CANCER CENTER ONLY)
Abs Immature Granulocytes: 0.02 K/uL (ref 0.00–0.07)
Basophils Absolute: 0.1 K/uL (ref 0.0–0.1)
Basophils Relative: 2 %
Eosinophils Absolute: 0.4 K/uL (ref 0.0–0.5)
Eosinophils Relative: 7 %
HCT: 33.1 % — ABNORMAL LOW (ref 36.0–46.0)
Hemoglobin: 11.1 g/dL — ABNORMAL LOW (ref 12.0–15.0)
Immature Granulocytes: 0 %
Lymphocytes Relative: 18 %
Lymphs Abs: 1 K/uL (ref 0.7–4.0)
MCH: 35.6 pg — ABNORMAL HIGH (ref 26.0–34.0)
MCHC: 33.5 g/dL (ref 30.0–36.0)
MCV: 106.1 fL — ABNORMAL HIGH (ref 80.0–100.0)
Monocytes Absolute: 0.6 K/uL (ref 0.1–1.0)
Monocytes Relative: 12 %
Neutro Abs: 3.3 K/uL (ref 1.7–7.7)
Neutrophils Relative %: 61 %
Platelet Count: 204 K/uL (ref 150–400)
RBC: 3.12 MIL/uL — ABNORMAL LOW (ref 3.87–5.11)
RDW: 13.4 % (ref 11.5–15.5)
WBC Count: 5.4 K/uL (ref 4.0–10.5)
nRBC: 0 % (ref 0.0–0.2)

## 2023-12-05 LAB — CMP (CANCER CENTER ONLY)
ALT: 14 U/L (ref 0–44)
AST: 22 U/L (ref 15–41)
Albumin: 3.9 g/dL (ref 3.5–5.0)
Alkaline Phosphatase: 51 U/L (ref 38–126)
Anion gap: 11 (ref 5–15)
BUN: 22 mg/dL (ref 8–23)
CO2: 23 mmol/L (ref 22–32)
Calcium: 10.5 mg/dL — ABNORMAL HIGH (ref 8.9–10.3)
Chloride: 101 mmol/L (ref 98–111)
Creatinine: 1.4 mg/dL — ABNORMAL HIGH (ref 0.44–1.00)
GFR, Estimated: 38 mL/min — ABNORMAL LOW (ref >60)
Glucose, Bld: 103 mg/dL — ABNORMAL HIGH (ref 70–99)
Potassium: 4.2 mmol/L (ref 3.5–5.1)
Sodium: 135 mmol/L (ref 135–145)
Total Bilirubin: 0.7 mg/dL (ref 0.0–1.2)
Total Protein: 7.1 g/dL (ref 6.5–8.1)

## 2023-12-05 NOTE — Patient Instructions (Signed)
 HOLD verzinio- until further directions.

## 2023-12-05 NOTE — Assessment & Plan Note (Addendum)
#   Right breast cancer -[facility]; ER/PR positive Her 2 Negative ;  G-3; LVI + T2N1.  Stage II [OCT 2023; Dr.Byrnett] s/p Lumpectomy; node positive-Oncotype RS- 35- s/p  adjuvant chemotherapy with Taxotere -Cytoxan  every 3 weeks x4 cycles.  S/p postlumpectomy radiation April 3rd 2024. Poor tolerance to adjuvant abemaciclib  100 mg BID [started second week of June].  # Currently on anastrozole  once a day [April 2024]- continue the same.  JAN 2025- re-started Verzinio at reduced dose at 50 mg BID; BUT GIVEN WORSENING FATIGUE/MYOPATHY HOLD Verzinio.   Labs-CBC/chemistries were reviewed with the patient.  # # BIL LE weakness- ? Myopathy- vs overall fatigue- will refer to [facility]. ALSO HOLD verzinio- until further directions.  Also discussed regarding care program.  # Bone health-2023 osteopenic T-score of -1.1.Continue calcium  plus vitamin D .- ON [JAN 2025- #1 of planned- 6] adjuvant Zometa  every 6 months x 3 years. S/p dental evaluation - stable. Continue adjuvant Zometa - reduce the dose to 3 mg [given GFR 36]  # COPD [Dr.Dgayli]-on Breztri /albuterol  -worse- would recommend evaluation with pulmonary.    # [2nd,FEB 2024] CTA-  acute pulmonary embolism/ LEFT LE- Acute DVT-- [provoked sec to chemo]. Stop eliquis  in End of  AUG 2024. Stable.    # CKD- stage III- monitor for now./ APRIL 2025- CA 10.4; ca+vitD on stable.    # Vaccination: s/p COVID; Flu; OK with RSV.   # IV Access: PIV   # Zometa  - 7/11-2023 q 6 m [3mg ]  # DISPOSITION: # Myopathy-- will refer to [facility] # follow in 4 weeks- MD;labs- cbc/cmp; Mag-  Dr.B

## 2023-12-05 NOTE — Progress Notes (Signed)
 [facility] CONSULT NOTE  Patient Care Team: Bair, Kalpana, MD as PCP - General (Family Medicine) [name], MD as PCP - Cardiology (Cardiology) Dessa, [name], MD as Consulting Physician (General Surgery) [name], MD (Internal Medicine) [name], RN as Oncology Nurse Navigator [name], MD as Consulting Physician (Radiation Oncology) [name], MD as Consulting Physician (Oncology) [name], DO as Consulting Physician (General Surgery)  CHIEF COMPLAINTS/PURPOSE OF CONSULTATION: Breast cancer  #  Oncology History Overview Note  # 2009- Sigmoid colon cancer stage II (T3 N0 4 lymph nodes sampled M0 stage II high risk there was obstruction and perforation abscess; Dr.Hern), workup included a low CEA preoperatively and a chest x-ray and CT scan abdomen pelvis negative for metastatic disease, S/P colostomy; s/p FOLFOX x 6 months. [Dr.Gittin ]  # LATER REVERSED , K RAS  wild type B RAF not evaluated   Also initial iron deficiency with anemia thrombocytosis, high platelets considered reactive, workup includes a normal PCR for CML and a negative [id number] mutation  DIAGNOSIS:  A. BREAST, RIGHT; LUMPECTOMY:  - INVASIVE MAMMARY CARCINOMA.  - DUCTAL CARCINOMA IN SITU (DCIS).  - SEE CANCER SUMMARY BELOW.  - BIOPSY SITE CHANGE WITH CLIP (2).  - FIBROUS TISSUE WITH CYST FORMATION AND FLORID DUCTAL HYPERPLASIA.  - SKIN AND NIPPLE NEGATIVE FOR MALIGNANCY.  - VASCULAR CALCIFICATION.   B. SENTINEL LYMPH NODES 1 AND 2, RIGHT AXILLA; EXCISION:  - METASTATIC CARCINOMA INVOLVES ONE OF TWO LYMPH NODES (1/2).   C. NON-SENTINEL LYMPH NODES, RIGHT AXILLA; EXCISION:  - ONE LYMPH NODE NEGATIVE FOR MALIGNANCY (0/1).   CANCER CASE SUMMARY: INVASIVE CARCINOMA OF THE BREAST  Standard(s): AJCC-UICC 8   SPECIMEN  Procedure: Lumpectomy  Specimen Laterality: Right   TUMOR  Histologic Type: Invasive carcinoma of no special type (ductal)   Histologic Grade (Nottingham Histologic Score)       Glandular (Acinar)/Tubular Differentiation: 3       Nuclear Pleomorphism: 3       Mitotic Rate: 3       Overall Grade: 3  Tumor Size: 24 mm  Tumor Focality: Single focus of invasive carcinoma  Ductal Carcinoma In Situ (DCIS): Present, nuclear grade 2-3  Tumor Extent: Not applicable  Lymphatic and/or Vascular Invasion: Present  Treatment Effect in the Breast: No known presurgical therapy   MARGINS  Margin Status for Invasive Carcinoma: All margins negative for invasive  carcinoma       Distance from closest margin: 10 mm       Specify closest margin: Superior   Margin Status for DCIS: All margins negative for DCIS       Distance from DCIS to closest margin: 10 mm       Specify closest margin: Superior   REGIONAL LYMPH NODES  Regional Lymph Node Status: Tumor present in regional lymph node(s)       Number of Lymph Nodes with Macrometastases (greater than 2 mm): 1       Number of Lymph Nodes with Micrometastases (greater than 0.2 mm to  2 mm and/or greater than 200 cells): 0       Number of Lymph Nodes with Isolated Tumor Cells (0.2 mm or less OR  200 cells or less): 0       Size of Largest Metastatic Deposit: 3 mm      Extranodal Extension: Not identified       Total Number of Lymph Nodes Examined (sentinel and non-sentinel): 3  Number of Sentinel Nodes Examined: 2   DISTANT METASTASIS  Distant Site(s) Involved, if applicable: Not applicable   PATHOLOGIC STAGE CLASSIFICATION (pTNM, AJCC 8th Edition):  Modified Classification: Not applicable  pT Category: pT2  T Suffix: Not applicable  pN Category: pN1a  N Suffix: (sn)  pM Category: Not applicable   SPECIAL STUDIES  Breast Biomarker Testing Performed on Previous Outside Biopsy:  23-250-T11  Per outside report:  Estrogen Receptor (ER) Status: POSITIVE          Percentage of cells with nuclear positivity: Greater than 90%          Average intensity of staining:  Strong   Progesterone Receptor (PgR) Status: POSITIVE          Percentage of cells with nuclear positivity: 60%          Average intensity of staining: Strong   HER2 (by immunohistochemistry): EQUIVOCAL (Score 2+)  HER2 FISH: NEGATIVE   Ki-67: Not performed   # Cytoxan  Taxotere  x 4 cycles; finished JAN, 2024. [Neutropenic fevers s/p cycle #4;   -FEB 2nd, 2024-right lung PE; left lower LE  DVT- on eliquis .   # FEB 19th-start RT; mpectomy radiation April 3rd 2024.  # anastrozole  once a day [April 2024]-t #  Poor tolerance to adjuvant abemaciclib  100 mg BID [started second week of June] stopped x 1 month.    # JAN 8th 2024- start abemaciclib  50 mg BID       Cancer of sigmoid (HCC) (Resolved)  Carcinoma of overlapping sites of right breast in female, estrogen receptor positive (HCC)  02/14/2022 Initial Diagnosis   Carcinoma of overlapping sites of right breast in female, estrogen receptor positive (HCC)   02/14/2022 Cancer Staging   Staging form: Breast, AJCC 8th Edition - Pathologic: Stage IIA (pT2, pN1, cM0, G3, ER+, PR+, HER2-) - Signed by [name], MD on 02/14/2022 Histologic grading system: 3 grade system   02/23/2022 -  Chemotherapy   Patient is on Treatment Plan : BREAST TC q21d      HISTORY OF PRESENTING ILLNESS:   Alone.  Ambulating independently.   Sabrina Wilson 83 y.o.  female patient with ER/PR positive Her 2 Negative  breast cancer stage II T2 N1-currently  adjuvant anastrozole -and verzinio  [re-started in JAN 2025] is here for a follow up.   Patient notes worsening generalized weakness.  Also noted to have patient continues to have weakness of BIL LE.  She also notes her breathing is worse in the mornings.  She continues to be on inhalers.  Patient continues to have chronic Diarrhea 3-4 times a month. No anti-diarrheal. Neuropathy in fingertips. Still able to play the piano. Denies pain. Appetite is normal.  Denies any nausea vomiting. Notes to  have   Review of Systems  Constitutional:  Positive for malaise/fatigue. Negative for chills, diaphoresis and fever.  HENT:  Negative for nosebleeds and sore throat.   Eyes:  Negative for double vision.  Respiratory:  Negative for hemoptysis, sputum production and wheezing.   Cardiovascular:  Negative for chest pain, palpitations, orthopnea and leg swelling.  Gastrointestinal:  Negative for blood in stool, constipation, diarrhea, heartburn, melena and vomiting.  Genitourinary:  Negative for dysuria, frequency and urgency.  Musculoskeletal:  Positive for back pain and joint pain.  Skin: Negative.  Negative for itching and rash.  Neurological:  Negative for dizziness, tingling, focal weakness, weakness and headaches.  Endo/Heme/Allergies:  Does not bruise/bleed easily.  Psychiatric/Behavioral:  Negative for depression. The patient is  not nervous/anxious and does not have insomnia.      MEDICAL HISTORY:  Past Medical History:  Diagnosis Date   Actinic keratosis 02/12/2020   R lat calf (hypertrophic)   Arthritis    Cancer of sigmoid (HCC) 06/17/2016   Colon cancer (HCC) 2009   T3,N0; s/p resection  Dr. Primus and chemotherapy by Dr. Brooks   Diastolic dysfunction    a. 08/2020 Echo: EF 60-65%, no rwma, Gr1 DD, nl RV size/fxn. Mild MR.   DVT (deep venous thrombosis) (HCC) 05/29/2022   Hernia of flank    History of actinic keratoses 08/11/2020   Bx proven at left lateral ankle proximal, LN2 10/08/20   History of stress test    a. 09/2020 MV: EF >65%. No ischemia/infarct. Mild Ao Ca2+ and minimal Cor Ca2+.   Hypertension    Hypomagnesemia 05/29/2022   Hyponatremia 05/29/2022   Neuropathy    Personal history of radiation therapy 2023   right breast CA   Polyp at cervical os    s/p resection Dr. Cleotilde   Pulmonary embolism (HCC) 05/27/2022   Skin cancer 01/22/2020   surface of an atypical verrucous squamous proliferation    Squamous cell carcinoma of skin 07/16/2020   Left lateral  ankle anterior, [facility] with [facility] 08-11-2020    SURGICAL HISTORY: Past Surgical History:  Procedure Laterality Date   BREAST BIOPSY Right 05/2014   Dr. Fredirick office-benign   BREAST LUMPECTOMY WITH SENTINEL LYMPH NODE BIOPSY Right 01/21/2022   Procedure: BREAST LUMPECTOMY WITH SENTINEL LYMPH NODE BX;  Surgeon: Dessa [name], MD;  Location: [facility] ORS;  Service: General;  Laterality: Right;   BREAST SURGERY Right February 2016   Vacuum assisted biopsy for recurrent cyst, fibrocystic changes without evidence of malignancy.   CATARACT EXTRACTION W/PHACO Left 01/13/2015   Procedure: CATARACT EXTRACTION PHACO AND INTRAOCULAR LENS PLACEMENT (IOC);  Surgeon: Elsie Carmine, MD;  Location: [facility] ORS;  Service: Ophthalmology;  Laterality: Left;  US   1:02.9 AP   19.2 CDE  12.06 casette lot #  [id number]   CATARACT EXTRACTION W/PHACO Right 02/03/2015   Procedure: CATARACT EXTRACTION PHACO AND INTRAOCULAR LENS PLACEMENT (IOC);  Surgeon: Elsie Carmine, MD;  Location: [facility] ORS;  Service: Ophthalmology;  Laterality: Right;  [id number]:53 ap46.5 [id number]   COLECTOMY     COLONOSCOPY  2015   Dr Ora   COLONOSCOPY WITH PROPOFOL  N/A 09/13/2017   Procedure: COLONOSCOPY WITH PROPOFOL ;  Surgeon: Dessa [name], MD;  Location: [facility] ENDOSCOPY;  Service: Endoscopy;  Laterality: N/A;   COLONOSCOPY WITH PROPOFOL  N/A 11/08/2017   Procedure: COLONOSCOPY WITH PROPOFOL ;  Surgeon: Dessa [name], MD;  Location: [facility] ENDOSCOPY;  Service: Endoscopy;  Laterality: N/A;   colostomy reversal     DILATION AND CURETTAGE OF UTERUS  2014   Dr. Cleotilde, benign per pt   EYE SURGERY     HERNIA REPAIR  07/13/12   ventral    SKIN CANCER EXCISION     TONSILLECTOMY      SOCIAL HISTORY: Social History   Socioeconomic History   Marital status: Married    Spouse name: Not on file   Number of children: Not on file   Years of education: Not on file   Highest education level: 12th grade  Occupational History   Not on file   Tobacco Use   Smoking status: Former    Current packs/day: 0.00    Types: Cigarettes    Quit date: 10/12/2007    Years since quitting: 16.1   Smokeless  tobacco: Never  Vaping Use   Vaping status: Never Used  Substance and Sexual Activity   Alcohol use: Yes    Alcohol/week: 1.0 standard drink of alcohol    Types: 1 Standard drinks or equivalent per week    Comment: with dinner GLASS OF WINE EACH DAY   Drug use: No   Sexual activity: Not on file  Other Topics Concern   Not on file  Social History Narrative   Lives in [place] with husband. 2 children, son lives nearby.Diet - regularExercise - none. 30 mins/in [place]. Quit smoking in 2009. Wine before dinner. Pianist in church; real estate; Diplomatic Services operational officer.    Social Drivers of Corporate investment banker Strain: Low Risk  (11/23/2023)   Overall Financial Resource Strain (CARDIA)    Difficulty of Paying Living Expenses: Not hard at all  Food Insecurity: No Food Insecurity (11/23/2023)   Hunger Vital Sign    Worried About Running Out of Food in the Last Year: Never true    Ran Out of Food in the Last Year: Never true  Transportation Needs: No Transportation Needs (11/23/2023)   PRAPARE - Administrator, Civil Service (Medical): No    Lack of Transportation (Non-Medical): No  Physical Activity: Insufficiently Active (11/23/2023)   Exercise Vital Sign    Days of Exercise per Week: 1 day    Minutes of Exercise per Session: 30 min  Stress: No Stress Concern Present (11/23/2023)   [employer] of Occupational Health - Occupational Stress Questionnaire    Feeling of Stress: Not at all  Social Connections: Socially Integrated (11/23/2023)   Social Connection and Isolation Panel    Frequency of Communication with Friends and Family: More than three times a week    Frequency of Social Gatherings with Friends and Family: Three times a week    Attends Religious Services: More than 4 times per year    Active Member of Clubs  or Organizations: Yes    Attends Banker Meetings: More than 4 times per year    Marital Status: Married  Catering manager Violence: Not At Risk (11/27/2023)   Humiliation, Afraid, Rape, and Kick questionnaire    Fear of Current or Ex-Partner: No    Emotionally Abused: No    Physically Abused: No    Sexually Abused: No    FAMILY HISTORY: Family History  Problem Relation Age of Onset   Stroke Mother    Diabetes Mother    Colon cancer Father        dx 69s   Stroke Sister    Colon cancer Brother        dx 72s-70s   Brain cancer Brother        dx 60s-70s   Breast cancer Neg Hx     ALLERGIES:  is allergic to sulfa antibiotics.  MEDICATIONS:  Current Outpatient Medications  Medication Sig Dispense Refill   abemaciclib  (VERZENIO ) 50 MG tablet Take 1 tablet (50 mg total) by mouth 2 (two) times daily. 56 tablet 3   anastrozole  (ARIMIDEX ) 1 MG tablet TAKE 1 TABLET BY MOUTH DAILY 90 tablet 0   atorvastatin  (LIPITOR) 10 MG tablet Take 1 tablet (10 mg total) by mouth daily. 90 tablet 3   budesonide-glycopyrrolate -formoterol (BREZTRI  AEROSPHERE) 160-9-4.8 MCG/ACT AERO inhaler Inhale 2 puffs into the lungs in the morning and at bedtime. 1 each 11   furosemide  (LASIX ) 20 MG tablet TAKE 1 TABLET BY MOUTH EVERY OTHER DAY 30 tablet 1  halobetasol  (ULTRAVATE ) 0.05 % cream Apply topically to aa's of rash at body twice daily on weekends only as needed. Avoid applying to face, groin, and axilla. Use as directed. 50 g 1   KLOR-CON  M20 20 MEQ tablet TAKE 1 TABLET BY MOUTH EVERY OTHER DAY 30 tablet 0   loratadine (CLARITIN) 10 MG tablet Take 10 mg by mouth daily.     niacinamide 500 MG tablet Take 500 mg by mouth 2 (two) times daily.     Omega-3 Fatty Acids (FISH OIL PO) Take 1 capsule by mouth daily.     Spacer/Aero-Holding Chambers (AEROCHAMBER MV) inhaler Use as instructed 1 each 0   tacrolimus  (PROTOPIC ) 0.1 % ointment Apply topically to affected areas of rash twice daily on week days  only prn 60 g 1   No current facility-administered medications for this visit.   PHYSICAL EXAMINATION:   Vitals:   12/05/23 1245  BP: (!) 156/90  Pulse: 92  Resp: 18  Temp: 98.7 F (37.1 C)  SpO2: 98%    Filed Weights   12/05/23 1245  Weight: 172 lb 11.2 oz (78.3 kg)    Physical Exam Vitals and nursing note reviewed.  HENT:     Head: Normocephalic and atraumatic.     Mouth/Throat:     Pharynx: Oropharynx is clear.  Eyes:     Extraocular Movements: Extraocular movements intact.     Pupils: Pupils are equal, round, and reactive to light.  Cardiovascular:     Rate and Rhythm: Normal rate and regular rhythm.     Heart sounds: Murmur heard.  Pulmonary:     Comments: Decreased breath sounds bilaterally.  Abdominal:     Palpations: Abdomen is soft.  Musculoskeletal:        General: Normal range of motion.     Cervical back: Normal range of motion.  Skin:    General: Skin is warm.  Neurological:     General: No focal deficit present.     Mental Status: She is alert and oriented to person, place, and time.  Psychiatric:        Behavior: Behavior normal.        Judgment: Judgment normal.      LABORATORY DATA:  I have reviewed the data as listed Lab Results  Component Value Date   WBC 5.4 12/05/2023   HGB 11.1 (L) 12/05/2023   HCT 33.1 (L) 12/05/2023   MCV 106.1 (H) 12/05/2023   PLT 204 12/05/2023   Recent Labs    09/26/23 1441 10/03/23 1544 10/31/23 1259 12/05/23 1306  NA 137 140 137 135  K 3.9 4.6 4.7 4.2  CL 103 103 104 101  CO2 25 20 23 23   GLUCOSE 111* 103* 107* 103*  BUN 27* 27 22 22   CREATININE 1.24* 1.23* 1.45* 1.40*  CALCIUM  9.9 9.8 9.9 10.5*  GFRNONAA 44*  --  36* 38*  PROT 7.1  --  7.0 7.1  ALBUMIN 4.3  --  4.1 3.9  AST 22  --  23 22  ALT 14  --  16 14  ALKPHOS 44  --  46 51  BILITOT 0.6  --  0.7 0.7    RADIOGRAPHIC STUDIES: I have personally reviewed the radiological images as listed and agreed with the findings in the  report. No results found.  ASSESSMENT & PLAN:   Carcinoma of overlapping sites of right breast in female, estrogen receptor positive (HCC) # Right breast cancer -[facility]; ER/PR positive Her 2 Negative ;  G-3; LVI + T2N1.  Stage II [OCT 2023; Dr.Byrnett] s/p Lumpectomy; node positive-Oncotype RS- 35- s/p  adjuvant chemotherapy with Taxotere -Cytoxan  every 3 weeks x4 cycles.  S/p postlumpectomy radiation April 3rd 2024. Poor tolerance to adjuvant abemaciclib  100 mg BID [started second week of June].  # Currently on anastrozole  once a day [April 2024]- continue the same.  JAN 2025- re-started Verzinio at reduced dose at 50 mg BID; BUT GIVEN WORSENING FATIGUE/MYOPATHY HOLD Verzinio.   Labs-CBC/chemistries were reviewed with the patient.  # # BIL LE weakness- ? Myopathy- vs overall fatigue- will refer to [facility]. ALSO HOLD verzinio- until further directions.  Also discussed regarding care program.  # Bone health-2023 osteopenic T-score of -1.1.Continue calcium  plus vitamin D .- ON [JAN 2025- #1 of planned- 6] adjuvant Zometa  every 6 months x 3 years. S/p dental evaluation - stable. Continue adjuvant Zometa - reduce the dose to 3 mg [given GFR 36]  # COPD [Dr.Dgayli]-on Breztri /albuterol  -worse- would recommend evaluation with pulmonary.    # [2nd,FEB 2024] CTA-  acute pulmonary embolism/ LEFT LE- Acute DVT-- [provoked sec to chemo]. Stop eliquis  in End of  AUG 2024. Stable.    # CKD- stage III- monitor for now./ APRIL 2025- CA 10.4; ca+vitD on stable.    # Vaccination: s/p COVID; Flu; OK with RSV.   # IV Access: PIV   # Zometa  - 7/11-2023 q 6 m [3mg ]  # DISPOSITION: # Myopathy-- will refer to [facility] # follow in 4 weeks- MD;labs- cbc/cmp; Mag-  Dr.B      All questions were answered. The patient/family knows to call the clinic with any problems, questions or concerns.    Cindy JONELLE Joe, MD 12/05/2023 2:16 PM

## 2023-12-05 NOTE — Progress Notes (Signed)
 Patient states she has been experiencing some Fatigued.

## 2023-12-06 ENCOUNTER — Other Ambulatory Visit: Payer: Self-pay | Admitting: Internal Medicine

## 2023-12-06 ENCOUNTER — Other Ambulatory Visit: Payer: Self-pay

## 2023-12-06 DIAGNOSIS — C50811 Malignant neoplasm of overlapping sites of right female breast: Secondary | ICD-10-CM

## 2023-12-07 ENCOUNTER — Other Ambulatory Visit: Payer: Self-pay | Admitting: *Deleted

## 2023-12-07 ENCOUNTER — Other Ambulatory Visit: Payer: Self-pay

## 2023-12-07 ENCOUNTER — Encounter: Payer: Self-pay | Admitting: Internal Medicine

## 2023-12-07 MED ORDER — ABEMACICLIB 50 MG PO TABS
50.0000 mg | ORAL_TABLET | Freq: Two times a day (BID) | ORAL | 3 refills | Status: DC
  Filled 2023-12-07: qty 56, 28d supply, fill #0

## 2023-12-08 ENCOUNTER — Other Ambulatory Visit: Payer: Self-pay

## 2023-12-22 DIAGNOSIS — H353131 Nonexudative age-related macular degeneration, bilateral, early dry stage: Secondary | ICD-10-CM | POA: Diagnosis not present

## 2023-12-22 DIAGNOSIS — H43813 Vitreous degeneration, bilateral: Secondary | ICD-10-CM | POA: Diagnosis not present

## 2023-12-22 DIAGNOSIS — M3501 Sicca syndrome with keratoconjunctivitis: Secondary | ICD-10-CM | POA: Diagnosis not present

## 2024-01-02 ENCOUNTER — Encounter: Payer: Self-pay | Admitting: Dermatology

## 2024-01-02 ENCOUNTER — Ambulatory Visit (INDEPENDENT_AMBULATORY_CARE_PROVIDER_SITE_OTHER): Payer: Medicare Other | Admitting: Dermatology

## 2024-01-02 DIAGNOSIS — W908XXA Exposure to other nonionizing radiation, initial encounter: Secondary | ICD-10-CM

## 2024-01-02 DIAGNOSIS — D1801 Hemangioma of skin and subcutaneous tissue: Secondary | ICD-10-CM

## 2024-01-02 DIAGNOSIS — Z85828 Personal history of other malignant neoplasm of skin: Secondary | ICD-10-CM

## 2024-01-02 DIAGNOSIS — L209 Atopic dermatitis, unspecified: Secondary | ICD-10-CM

## 2024-01-02 DIAGNOSIS — Z1283 Encounter for screening for malignant neoplasm of skin: Secondary | ICD-10-CM | POA: Diagnosis not present

## 2024-01-02 DIAGNOSIS — L814 Other melanin hyperpigmentation: Secondary | ICD-10-CM | POA: Diagnosis not present

## 2024-01-02 DIAGNOSIS — L578 Other skin changes due to chronic exposure to nonionizing radiation: Secondary | ICD-10-CM

## 2024-01-02 DIAGNOSIS — D229 Melanocytic nevi, unspecified: Secondary | ICD-10-CM

## 2024-01-02 DIAGNOSIS — L821 Other seborrheic keratosis: Secondary | ICD-10-CM | POA: Diagnosis not present

## 2024-01-02 NOTE — Patient Instructions (Signed)

## 2024-01-02 NOTE — Progress Notes (Signed)
   Follow-Up Visit   Subjective  Sabrina Wilson is a 83 y.o. female who presents for the following: Skin Cancer Screening and Full Body Skin Exam  The patient presents for Total-Body Skin Exam (TBSE) for skin cancer screening and mole check. The patient has spots, moles and lesions to be evaluated, some may be new or changing and the patient may have concern these could be cancer.  The following portions of the chart were reviewed this encounter and updated as appropriate: medications, allergies, medical history  Review of Systems:  No other skin or systemic complaints except as noted in HPI or Assessment and Plan.  Objective  Well appearing patient in no apparent distress; mood and affect are within normal limits.  A full examination was performed including scalp, head, eyes, ears, nose, lips, neck, chest, axillae, abdomen, back, buttocks, bilateral upper extremities, bilateral lower extremities, hands, feet, fingers, toes, fingernails, and toenails. All findings within normal limits unless otherwise noted below.   Relevant physical exam findings are noted in the Assessment and Plan.    Assessment & Plan   SKIN CANCER SCREENING PERFORMED TODAY.  ACTINIC DAMAGE - Chronic condition, secondary to cumulative UV/sun exposure - diffuse scaly erythematous macules with underlying dyspigmentation - Recommend daily broad spectrum sunscreen SPF 30+ to sun-exposed areas, reapply every 2 hours as needed.  - Staying in the shade or wearing long sleeves, sun glasses (UVA+UVB protection) and wide brim hats (4-inch brim around the entire circumference of the hat) are also recommended for sun protection.  - Call for new or changing lesions.  LENTIGINES, SEBORRHEIC KERATOSES, HEMANGIOMAS - Benign normal skin lesions - Benign-appearing - Call for any changes  MELANOCYTIC NEVI - Tan-brown and/or pink-flesh-colored symmetric macules and papules - Benign appearing on exam today - Observation -  Call clinic for new or changing moles - Recommend daily use of broad spectrum spf 30+ sunscreen to sun-exposed areas.   HISTORY OF SQUAMOUS CELL CARCINOMA OF THE SKIN - L lat ant ankle tx with ED&C 08/11/2020 - No evidence of recurrence today - No lymphadenopathy - Recommend regular full body skin exams - Recommend daily broad spectrum sunscreen SPF 30+ to sun-exposed areas, reapply every 2 hours as needed.  - Call if any new or changing lesions are noted between office visits  ATOPIC DERMATITIS, flaring Exam: Scaly pink papules coalescing to plaques surrounding R axillary vault on posterior shoulder/upper back <1% BSA  Chronic and persistent condition with duration or expected duration over one year. Condition is symptomatic/ bothersome to patient. Not currently at goal.   Atopic dermatitis (eczema) is a chronic, relapsing, pruritic condition that can significantly affect quality of life. It is often associated with allergic rhinitis and/or asthma and can require treatment with topical medications, phototherapy, or in severe cases biologic injectable medication (Dupixent; Adbry) or Oral JAK inhibitors.  Treatment Plan: Continue Tacrolimus  BID and Halobetasol  ointment on the weekend BID (patient has at home already).  Recommend gentle skin care.  LENTIGINES   MULTIPLE BENIGN NEVI   SEBORRHEIC KERATOSES   ACTINIC ELASTOSIS   CHERRY ANGIOMA   ATOPIC DERMATITIS, UNSPECIFIED TYPE   Return in about 1 year (around 01/01/2025) for TBSE - hx SCC, AK, ISK.  I, Rosina Mayans, CMA, am acting as scribe for Boneta Sharps, MD .   Documentation: I have reviewed the above documentation for accuracy and completeness, and I agree with the above.  Boneta Sharps, MD

## 2024-01-03 ENCOUNTER — Other Ambulatory Visit: Payer: Self-pay | Admitting: *Deleted

## 2024-01-03 ENCOUNTER — Other Ambulatory Visit: Payer: Self-pay

## 2024-01-03 ENCOUNTER — Inpatient Hospital Stay: Attending: Internal Medicine | Admitting: Occupational Therapy

## 2024-01-03 DIAGNOSIS — Z1732 Human epidermal growth factor receptor 2 negative status: Secondary | ICD-10-CM | POA: Insufficient documentation

## 2024-01-03 DIAGNOSIS — Z87891 Personal history of nicotine dependence: Secondary | ICD-10-CM | POA: Insufficient documentation

## 2024-01-03 DIAGNOSIS — Z923 Personal history of irradiation: Secondary | ICD-10-CM | POA: Insufficient documentation

## 2024-01-03 DIAGNOSIS — Z170 Estrogen receptor positive status [ER+]: Secondary | ICD-10-CM | POA: Insufficient documentation

## 2024-01-03 DIAGNOSIS — C50811 Malignant neoplasm of overlapping sites of right female breast: Secondary | ICD-10-CM | POA: Insufficient documentation

## 2024-01-03 DIAGNOSIS — Z1721 Progesterone receptor positive status: Secondary | ICD-10-CM | POA: Insufficient documentation

## 2024-01-03 DIAGNOSIS — Z86711 Personal history of pulmonary embolism: Secondary | ICD-10-CM | POA: Insufficient documentation

## 2024-01-03 DIAGNOSIS — Z86718 Personal history of other venous thrombosis and embolism: Secondary | ICD-10-CM | POA: Insufficient documentation

## 2024-01-03 DIAGNOSIS — M6281 Muscle weakness (generalized): Secondary | ICD-10-CM

## 2024-01-03 DIAGNOSIS — Z79811 Long term (current) use of aromatase inhibitors: Secondary | ICD-10-CM | POA: Insufficient documentation

## 2024-01-03 DIAGNOSIS — Z9221 Personal history of antineoplastic chemotherapy: Secondary | ICD-10-CM | POA: Insufficient documentation

## 2024-01-03 NOTE — Therapy (Signed)
 [facility] - A Dept Of [facility] [address] [place], [place], [address] Phone: [phone]   Fax:  [phone]  Occupational Therapy Screen  Patient Details  Name: Sabrina Wilson MRN: [id number] Date of Birth: 04-Sep-1940 No data recorded  Encounter Date: 01/03/2024   OT End of Session - 01/03/24 1450     Visit Number 0          Past Medical History:  Diagnosis Date   Actinic keratosis 02/12/2020   R lat calf (hypertrophic)   Arthritis    Cancer of sigmoid (HCC) 06/17/2016   Colon cancer (HCC) 2009   T3,N0; s/p resection  Dr. Primus and chemotherapy by Dr. Brooks   Diastolic dysfunction    a. 08/2020 Echo: EF 60-65%, no rwma, Gr1 DD, nl RV size/fxn. Mild MR.   DVT (deep venous thrombosis) (HCC) 05/29/2022   Hernia of flank    History of actinic keratoses 08/11/2020   Bx proven at left lateral ankle proximal, LN2 10/08/20   History of stress test    a. 09/2020 MV: EF >65%. No ischemia/infarct. Mild Ao Ca2+ and minimal Cor Ca2+.   Hypertension    Hypomagnesemia 05/29/2022   Hyponatremia 05/29/2022   Neuropathy    Personal history of radiation therapy 2023   right breast CA   Polyp at cervical os    s/p resection Dr. Cleotilde   Pulmonary embolism (HCC) 05/27/2022   Skin cancer 01/22/2020   surface of an atypical verrucous squamous proliferation    Squamous cell carcinoma of skin 07/16/2020   Left lateral ankle anterior, [facility] with [facility] 08-11-2020    Past Surgical History:  Procedure Laterality Date   BREAST BIOPSY Right 05/2014   Dr. Fredirick office-benign   BREAST LUMPECTOMY WITH SENTINEL LYMPH NODE BIOPSY Right 01/21/2022   Procedure: BREAST LUMPECTOMY WITH SENTINEL LYMPH NODE BX;  Surgeon: Dessa [name], MD;  Location: [facility] ORS;  Service: General;  Laterality: Right;   BREAST SURGERY Right February 2016   Vacuum assisted biopsy for recurrent cyst, fibrocystic changes without evidence of  malignancy.   CATARACT EXTRACTION W/PHACO Left 01/13/2015   Procedure: CATARACT EXTRACTION PHACO AND INTRAOCULAR LENS PLACEMENT (IOC);  Surgeon: Elsie Carmine, MD;  Location: [facility] ORS;  Service: Ophthalmology;  Laterality: Left;  US   1:02.9 AP   19.2 CDE  12.06 casette lot #  [id number]   CATARACT EXTRACTION W/PHACO Right 02/03/2015   Procedure: CATARACT EXTRACTION PHACO AND INTRAOCULAR LENS PLACEMENT (IOC);  Surgeon: Elsie Carmine, MD;  Location: [facility] ORS;  Service: Ophthalmology;  Laterality: Right;  us00:53 ap46.5 cde10.79   COLECTOMY     COLONOSCOPY  2015   Dr Ora   COLONOSCOPY WITH PROPOFOL  N/A 09/13/2017   Procedure: COLONOSCOPY WITH PROPOFOL ;  Surgeon: Dessa [name], MD;  Location: [facility] END[facility]COPY;  Service: Endoscopy;  Laterality: N/A;   COLONOSCOPY WITH PROPOFOL  N/A 11/08/2017   Procedure: COLONOSCOPY WITH PROPOFOL ;  Surgeon: Dessa [name], MD;  Location: [facility] ENDOSCOPY;  Service: Endoscopy;  Laterality: N/A;   colostomy reversal     DILATION AND CURETTAGE OF UTERUS  2014   Dr. Cleotilde, benign per pt   EYE SURGERY     HERNIA REPAIR  07/13/12   ventral    SKIN CANCER EXCISION     TONSILLECTOMY      There were no vitals filed for this visit.      12/05/23 Dr Dorethia: ASSESSMENT & PLAN:  Carcinoma of overlapping sites of right breast in female, estrogen receptor positive (HCC) # Right breast cancer -[facility]; ER/PR positive Her 2 Negative ;  G-3; LVI + T2N1.  Stage II [OCT 2023; Dr.Byrnett] s/p Lumpectomy; node positive-Oncotype RS- 35- s/p  adjuvant chemotherapy with Taxotere -Cytoxan  every 3 weeks x4 cycles.  S/p postlumpectomy radiation April 3rd 2024. Poor tolerance to adjuvant abemaciclib  100 mg BID [started second week of June].   # Currently on anastrozole  once a day [April 2024]- continue the same.  JAN 2025- re-started Verzinio at reduced dose at 50 mg BID; BUT GIVEN WORSENING FATIGUE/MYOPATHY HOLD Verzinio.   Labs-CBC/chemistries were reviewed with the  patient.   # # BIL LE weakness- ? Myopathy- vs overall fatigue- will refer to [facility]. ALSO HOLD verzinio- until further directions.  Also discussed regarding care program.   # Bone health-2023 osteopenic T-score of -1.1.Continue calcium  plus vitamin D .- ON [JAN 2025- #1 of planned- 6] adjuvant Zometa  every 6 months x 3 years. S/p dental evaluation - stable. Continue adjuvant Zometa - reduce the dose to 3 mg [given GFR 36]   # COPD [Dr.Dgayli]-on Breztri /albuterol  -worse- would recommend evaluation with pulmonary.     # [2nd,FEB 2024] CTA-  acute pulmonary embolism/ LEFT LE- Acute DVT-- [provoked sec to chemo]. Stop eliquis  in End of  AUG 2024. Stable.     # CKD- stage III- monitor for now./ APRIL 2025- CA 10.4; ca+vitD on stable.     # Vaccination: s/p COVID; Flu; OK with RSV.    # IV Access: PIV    # Zometa  - 7/11-2023 q 6 m [3mg ]   # DISPOSITION: # Myopathy-- will refer to [facility] # follow in 4 weeks- MD;labs- cbc/cmp; Mag-  Dr.B        OT SCREEN 01/03/24:  Patient referred for increased weakness versus myopathy. Patient arrived ambulating in with no assistive device.  Patient report she is doing much better since Dr. B stopped her Verzinio.  Patient report increased strength as well as increase endurance. Upon assessment patient with decreased hip flexion strength. Hip abduction adduction as well as knee extension flexion and dorsi plantarflexion within functional limits. Patient denies any falls in the last 6 months. Patient has railings at the top as well as a nonslip surface.  Patient reports she has a shower chair but not using it. Patient has 2 steps in her house going from the kitchen down to the den. And a ramp to get in and out of the house. Patient BERG balance test score 44/56 putting her at low risk for falling. Did review with patient that would like to have her with a little higher score.  Provided patient with home exercises at home to 3 times a day at least 150  minutes a week - Sit to stand stand to sit without hands 3 times a day 5-8 reps - Sidestepping holding onto the kitchen counter railing in the hallway 1 to 2 minutes - Heel raises holding onto the rail in the hallway 10 reps - And walking in her long hallway. Patient to see Dr. B next week. Discussed with patient CARE program -2 times a week.  Patient also with a diagnosis of COPD. Exercise program will be great for patient.  Patient agrees.  Will request referral from Dr. KATHEE. Until then patient can continue with home exercises.  Visit Diagnosis: Muscle weakness (generalized)    Problem List Patient Active Problem List   Diagnosis Date Noted   Stage 3b chronic kidney disease (HCC) 10/03/2023   Genetic testing 10/10/2022   Demand ischemia (HCC) 05/12/2022   Carcinoma of overlapping sites of right breast in female, estrogen receptor positive (HCC) 02/14/2022   Invasive lobular carcinoma of breast in female (HCC) 01/12/2022   Breast lesion 12/22/2021   Chronic bilateral low back pain without sciatica 12/14/2018   Varicose veins of bilateral lower extremities with other complications 01/31/2018   Hyperlipidemia 05/02/2016   Prediabetes 05/02/2016   Multiple nevi 04/06/2016   Allergic rhinitis 04/06/2016   Neuropathy 04/06/2016   Lipoma of neck 09/27/2013   Personal history of colon cancer 11/11/2011   Hypertension 12/18/2010    Ancel Peters, OTR/L,CLT 01/03/2024, 2:52 PM  [facility] - A Dept Of [place] [address], [address] [place], [place], [address] Phone: [phone]   Fax:  [phone]  Name: Sabrina Wilson MRN: [id number] Date of Birth: [birth year]

## 2024-01-08 ENCOUNTER — Inpatient Hospital Stay

## 2024-01-08 ENCOUNTER — Telehealth: Payer: Self-pay | Admitting: Pharmacy Technician

## 2024-01-08 ENCOUNTER — Other Ambulatory Visit

## 2024-01-08 ENCOUNTER — Inpatient Hospital Stay (HOSPITAL_BASED_OUTPATIENT_CLINIC_OR_DEPARTMENT_OTHER): Admitting: Internal Medicine

## 2024-01-08 ENCOUNTER — Other Ambulatory Visit (HOSPITAL_COMMUNITY): Payer: Self-pay

## 2024-01-08 ENCOUNTER — Encounter: Payer: Self-pay | Admitting: Internal Medicine

## 2024-01-08 ENCOUNTER — Telehealth: Payer: Self-pay | Admitting: Pharmacist

## 2024-01-08 VITALS — BP 173/102 | HR 73 | Temp 97.2°F | Resp 18 | Ht 66.0 in | Wt 174.8 lb

## 2024-01-08 DIAGNOSIS — Z1732 Human epidermal growth factor receptor 2 negative status: Secondary | ICD-10-CM | POA: Diagnosis not present

## 2024-01-08 DIAGNOSIS — Z1721 Progesterone receptor positive status: Secondary | ICD-10-CM | POA: Diagnosis not present

## 2024-01-08 DIAGNOSIS — Z87891 Personal history of nicotine dependence: Secondary | ICD-10-CM | POA: Diagnosis not present

## 2024-01-08 DIAGNOSIS — Z923 Personal history of irradiation: Secondary | ICD-10-CM | POA: Diagnosis not present

## 2024-01-08 DIAGNOSIS — C50811 Malignant neoplasm of overlapping sites of right female breast: Secondary | ICD-10-CM

## 2024-01-08 DIAGNOSIS — Z86711 Personal history of pulmonary embolism: Secondary | ICD-10-CM | POA: Diagnosis not present

## 2024-01-08 DIAGNOSIS — Z9221 Personal history of antineoplastic chemotherapy: Secondary | ICD-10-CM | POA: Diagnosis not present

## 2024-01-08 DIAGNOSIS — Z79811 Long term (current) use of aromatase inhibitors: Secondary | ICD-10-CM | POA: Diagnosis not present

## 2024-01-08 DIAGNOSIS — Z170 Estrogen receptor positive status [ER+]: Secondary | ICD-10-CM

## 2024-01-08 DIAGNOSIS — Z86718 Personal history of other venous thrombosis and embolism: Secondary | ICD-10-CM | POA: Diagnosis not present

## 2024-01-08 LAB — CBC WITH DIFFERENTIAL (CANCER CENTER ONLY)
Abs Immature Granulocytes: 0.05 K/uL (ref 0.00–0.07)
Basophils Absolute: 0.1 K/uL (ref 0.0–0.1)
Basophils Relative: 1 %
Eosinophils Absolute: 0.4 K/uL (ref 0.0–0.5)
Eosinophils Relative: 6 %
HCT: 33.8 % — ABNORMAL LOW (ref 36.0–46.0)
Hemoglobin: 11.2 g/dL — ABNORMAL LOW (ref 12.0–15.0)
Immature Granulocytes: 1 %
Lymphocytes Relative: 18 %
Lymphs Abs: 1.2 K/uL (ref 0.7–4.0)
MCH: 35.2 pg — ABNORMAL HIGH (ref 26.0–34.0)
MCHC: 33.1 g/dL (ref 30.0–36.0)
MCV: 106.3 fL — ABNORMAL HIGH (ref 80.0–100.0)
Monocytes Absolute: 0.7 K/uL (ref 0.1–1.0)
Monocytes Relative: 11 %
Neutro Abs: 4.3 K/uL (ref 1.7–7.7)
Neutrophils Relative %: 63 %
Platelet Count: 212 K/uL (ref 150–400)
RBC: 3.18 MIL/uL — ABNORMAL LOW (ref 3.87–5.11)
RDW: 13.7 % (ref 11.5–15.5)
WBC Count: 6.8 K/uL (ref 4.0–10.5)
nRBC: 0 % (ref 0.0–0.2)

## 2024-01-08 LAB — MAGNESIUM: Magnesium: 2 mg/dL (ref 1.7–2.4)

## 2024-01-08 LAB — CMP (CANCER CENTER ONLY)
ALT: 20 U/L (ref 0–44)
AST: 29 U/L (ref 15–41)
Albumin: 4.2 g/dL (ref 3.5–5.0)
Alkaline Phosphatase: 49 U/L (ref 38–126)
Anion gap: 10 (ref 5–15)
BUN: 23 mg/dL (ref 8–23)
CO2: 24 mmol/L (ref 22–32)
Calcium: 9.6 mg/dL (ref 8.9–10.3)
Chloride: 101 mmol/L (ref 98–111)
Creatinine: 0.94 mg/dL (ref 0.44–1.00)
GFR, Estimated: >60 mL/min (ref >60)
Glucose, Bld: 111 mg/dL — ABNORMAL HIGH (ref 70–99)
Potassium: 4 mmol/L (ref 3.5–5.1)
Sodium: 135 mmol/L (ref 135–145)
Total Bilirubin: 0.8 mg/dL (ref 0.0–1.2)
Total Protein: 7 g/dL (ref 6.5–8.1)

## 2024-01-08 NOTE — Telephone Encounter (Signed)
 Pharmacy Student Encounter   Received new prescription for Kisqali  (ribociclib ) for the adjuvant treatment of HR+/HER2- breast cancer.  Patient switching from Verzenio  (abemaciclib ), started 09/2022, due to intolerance. Verzenio  is usually dosed for 2 years as adjuvant treatment. Kisqali  is usually dosed for 3 years as adjuvant treatment.   CMP and CBC from 01/08/24 assessed, no relevant abnormalities identified. Prescription dose and frequency assessed.  Dosing: 400 mg by mouth once daily x 21 days, then 7 days off. 28 day cycle.  Current medication list in Epic reviewed, 1 DDI with ribociclib  identified: Atorvastatin :ribociclib ; ribociclib  may increase serum concentrations. No baseline dose adjustment needed.   Evaluated chart and no patient barriers to medication adherence identified.   Prescription has been e-scribed to the [facility] for benefits analysis and approval.  Oral Oncology Clinic will continue to follow for insurance authorization, copayment issues, initial counseling and start date.   Signe JINNY Platt, PharmD Candidate 2026  [facility] Oral Chemotherapy [facility] [phone]  01/08/2024 3:28 PM

## 2024-01-08 NOTE — Progress Notes (Signed)
 [facility] CONSULT NOTE  Patient Care Team: Bair, Kalpana, MD as PCP - General (Family Medicine) [name], MD as PCP - Cardiology (Cardiology) Dessa, [name], MD as Consulting Physician (General Surgery) [name], MD (Internal Medicine) [name], RN as Oncology Nurse Navigator [name], MD as Consulting Physician (Radiation Oncology) [name], MD as Consulting Physician (Oncology) [name], DO as Consulting Physician (General Surgery)  CHIEF COMPLAINTS/PURPOSE OF CONSULTATION: Breast cancer  #  Oncology History Overview Note  # 2009- Sigmoid colon cancer stage II (T3 N0 4 lymph nodes sampled M0 stage II high risk there was obstruction and perforation abscess; Dr.Hern), workup included a low CEA preoperatively and a chest x-ray and CT scan abdomen pelvis negative for metastatic disease, S/P colostomy; s/p FOLFOX x 6 months. [Dr.Gittin ]  # LATER REVERSED , K RAS  wild type B RAF not evaluated   Also initial iron deficiency with anemia thrombocytosis, high platelets considered reactive, workup includes a normal PCR for CML and a negative [id number] mutation  DIAGNOSIS:  A. BREAST, RIGHT; LUMPECTOMY:  - INVASIVE MAMMARY CARCINOMA.  - DUCTAL CARCINOMA IN SITU (DCIS).  - SEE CANCER SUMMARY BELOW.  - BIOPSY SITE CHANGE WITH CLIP (2).  - FIBROUS TISSUE WITH CYST FORMATION AND FLORID DUCTAL HYPERPLASIA.  - SKIN AND NIPPLE NEGATIVE FOR MALIGNANCY.  - VASCULAR CALCIFICATION.   B. SENTINEL LYMPH NODES 1 AND 2, RIGHT AXILLA; EXCISION:  - METASTATIC CARCINOMA INVOLVES ONE OF TWO LYMPH NODES (1/2).   C. NON-SENTINEL LYMPH NODES, RIGHT AXILLA; EXCISION:  - ONE LYMPH NODE NEGATIVE FOR MALIGNANCY (0/1).   CANCER CASE SUMMARY: INVASIVE CARCINOMA OF THE BREAST  Standard(s): AJCC-UICC 8   SPECIMEN  Procedure: Lumpectomy  Specimen Laterality: Right   TUMOR  Histologic Type: Invasive carcinoma of no special type (ductal)   Histologic Grade (Nottingham Histologic Score)       Glandular (Acinar)/Tubular Differentiation: 3       Nuclear Pleomorphism: 3       Mitotic Rate: 3       Overall Grade: 3  Tumor Size: 24 mm  Tumor Focality: Single focus of invasive carcinoma  Ductal Carcinoma In Situ (DCIS): Present, nuclear grade 2-3  Tumor Extent: Not applicable  Lymphatic and/or Vascular Invasion: Present  Treatment Effect in the Breast: No known presurgical therapy   MARGINS  Margin Status for Invasive Carcinoma: All margins negative for invasive  carcinoma       Distance from closest margin: 10 mm       Specify closest margin: Superior   Margin Status for DCIS: All margins negative for DCIS       Distance from DCIS to closest margin: 10 mm       Specify closest margin: Superior   REGIONAL LYMPH NODES  Regional Lymph Node Status: Tumor present in regional lymph node(s)       Number of Lymph Nodes with Macrometastases (greater than 2 mm): 1       Number of Lymph Nodes with Micrometastases (greater than 0.2 mm to  2 mm and/or greater than 200 cells): 0       Number of Lymph Nodes with Isolated Tumor Cells (0.2 mm or less OR  200 cells or less): 0       Size of Largest Metastatic Deposit: 3 mm      Extranodal Extension: Not identified       Total Number of Lymph Nodes Examined (sentinel and non-sentinel): 3  Number of Sentinel Nodes Examined: 2   DISTANT METASTASIS  Distant Site(s) Involved, if applicable: Not applicable   PATHOLOGIC STAGE CLASSIFICATION (pTNM, AJCC 8th Edition):  Modified Classification: Not applicable  pT Category: pT2  T Suffix: Not applicable  pN Category: pN1a  N Suffix: (sn)  pM Category: Not applicable   SPECIAL STUDIES  Breast Biomarker Testing Performed on Previous Outside Biopsy:  23-250-T11  Per outside report:  Estrogen Receptor (ER) Status: POSITIVE          Percentage of cells with nuclear positivity: Greater than 90%          Average intensity of staining:  Strong   Progesterone Receptor (PgR) Status: POSITIVE          Percentage of cells with nuclear positivity: 60%          Average intensity of staining: Strong   HER2 (by immunohistochemistry): EQUIVOCAL (Score 2+)  HER2 FISH: NEGATIVE   Ki-67: Not performed   # Cytoxan  Taxotere  x 4 cycles; finished JAN, 2024. [Neutropenic fevers s/p cycle #4;   -FEB 2nd, 2024-right lung PE; left lower LE  DVT- on eliquis .   # FEB 19th-start RT; mpectomy radiation April 3rd 2024.  # anastrozole  once a day [April 2024]-t # Poor tolerance to adjuvant abemaciclib  100 mg BID [started second week of June] stopped x 1 month.    # JAN 8th 2024- start abemaciclib  50 mg BID- STOPPED in AUG 2025- sec to intolerance.   # LATE SEP 2025- start RIBOCICLIB  400 mg 3 w- on and 1 week OFF      Cancer of sigmoid (HCC) (Resolved)  Carcinoma of overlapping sites of right breast in female, estrogen receptor positive (HCC)  02/14/2022 Initial Diagnosis   Carcinoma of overlapping sites of right breast in female, estrogen receptor positive (HCC)   02/14/2022 Cancer Staging   Staging form: Breast, AJCC 8th Edition - Pathologic: Stage IIA (pT2, pN1, cM0, G3, ER+, PR+, HER2-) - Signed by [name], MD on 02/14/2022 Histologic grading system: 3 grade system   02/23/2022 -  Chemotherapy   Patient is on Treatment Plan : BREAST TC q21d      HISTORY OF PRESENTING ILLNESS:   Alone.  Ambulating independently.   Sabrina Wilson 83 y.o.  female patient with ER/PR positive Her 2 Negative  breast cancer stage II T2 N1-currently  adjuvant anastrozole -and verzinio  [re-started in JAN 2025- stopped in AUG  2025] is here for a follow up.   Patient come off abema- noted to have significnat of her generalized weakness/  weakness of BIL LE/  the mornings.  She continues to be on inhalers. Diarrhea also imporved.   Neuropathy in fingertips. Still able to play the piano. Denies pain. Appetite is normal.  Denies any nausea  vomiting.    Review of Systems  Constitutional:  Positive for malaise/fatigue. Negative for chills, diaphoresis and fever.  HENT:  Negative for nosebleeds and sore throat.   Eyes:  Negative for double vision.  Respiratory:  Negative for hemoptysis, sputum production and wheezing.   Cardiovascular:  Negative for chest pain, palpitations, orthopnea and leg swelling.  Gastrointestinal:  Negative for blood in stool, constipation, diarrhea, heartburn, melena and vomiting.  Genitourinary:  Negative for dysuria, frequency and urgency.  Musculoskeletal:  Positive for back pain and joint pain.  Skin: Negative.  Negative for itching and rash.  Neurological:  Negative for dizziness, tingling, focal weakness, weakness and headaches.  Endo/Heme/Allergies:  Does not bruise/bleed easily.  Psychiatric/Behavioral:  Negative for depression. The patient is not nervous/anxious and does not have insomnia.      MEDICAL HISTORY:  Past Medical History:  Diagnosis Date   Actinic keratosis 02/12/2020   R lat calf (hypertrophic)   Arthritis    Cancer of sigmoid (HCC) 06/17/2016   Colon cancer (HCC) 2009   T3,N0; s/p resection  Dr. Primus and chemotherapy by Dr. Brooks   Diastolic dysfunction    a. 08/2020 Echo: EF 60-65%, no rwma, Gr1 DD, nl RV size/fxn. Mild MR.   DVT (deep venous thrombosis) (HCC) 05/29/2022   Hernia of flank    History of actinic keratoses 08/11/2020   Bx proven at left lateral ankle proximal, LN2 10/08/20   History of stress test    a. 09/2020 MV: EF >65%. No ischemia/infarct. Mild Ao Ca2+ and minimal Cor Ca2+.   Hypertension    Hypomagnesemia 05/29/2022   Hyponatremia 05/29/2022   Neuropathy    Personal history of radiation therapy 2023   right breast CA   Polyp at cervical os    s/p resection Dr. Cleotilde   Pulmonary embolism (HCC) 05/27/2022   Skin cancer 01/22/2020   surface of an atypical verrucous squamous proliferation    Squamous cell carcinoma of skin 07/16/2020   Left  lateral ankle anterior, treated with [facility] 08-11-2020    SURGICAL HISTORY: Past Surgical History:  Procedure Laterality Date   BREAST BIOPSY Right 05/2014   Dr. Fredirick office-benign   BREAST LUMPECTOMY WITH SENTINEL LYMPH NODE BIOPSY Right 01/21/2022   Procedure: BREAST LUMPECTOMY WITH SENTINEL LYMPH NODE BX;  Surgeon: Dessa [name], MD;  Location: [facility] ORS;  Service: General;  Laterality: Right;   BREAST SURGERY Right February 2016   Vacuum assisted biopsy for recurrent cyst, fibrocystic changes without evidence of malignancy.   CATARACT EXTRACTION W/PHACO Left 01/13/2015   Procedure: CATARACT EXTRACTION PHACO AND INTRAOCULAR LENS PLACEMENT (IOC);  Surgeon: Elsie Carmine, MD;  Location: [facility] ORS;  Service: Ophthalmology;  Laterality: Left;  US   1:02.9 AP   19.2 CDE  12.06 casette lot #  [id number]   CATARACT EXTRACTION W/PHACO Right 02/03/2015   Procedure: CATARACT EXTRACTION PHACO AND INTRAOCULAR LENS PLACEMENT (IOC);  Surgeon: Elsie Carmine, MD;  Location: [facility] ORS;  Service: Ophthalmology;  Laterality: Right;  us00:53 ap46.5 cde10.79   COLECTOMY     COLONOSCOPY  2015   Dr Ora   COLONOSCOPY WITH PROPOFOL  N/A 09/13/2017   Procedure: COLONOSCOPY WITH PROPOFOL ;  Surgeon: Dessa [name], MD;  Location: [facility];  Service: Endoscopy;  Laterality: N/A;   COLONOSCOPY WITH PROPOFOL  N/A 11/08/2017   Procedure: COLONOSCOPY WITH PROPOFOL ;  Surgeon: Dessa [name], MD;  Location: [facility] ENDOSCOPY;  Service: Endoscopy;  Laterality: N/A;   colostomy reversal     DILATION AND CURETTAGE OF UTERUS  2014   Dr. Cleotilde, benign per pt   EYE SURGERY     HERNIA REPAIR  07/13/12   ventral    SKIN CANCER EXCISION     TONSILLECTOMY      SOCIAL HISTORY: Social History   Socioeconomic History   Marital status: Married    Spouse name: Not on file   Number of children: Not on file   Years of education: Not on file   Highest education level: 12th grade  Occupational History   Not on  file  Tobacco Use   Smoking status: Former    Current packs/day: 0.00    Types: Cigarettes    Quit date: 10/12/2007  Years since quitting: 16.2   Smokeless tobacco: Never  Vaping Use   Vaping status: Never Used  Substance and Sexual Activity   Alcohol use: Yes    Alcohol/week: 1.0 standard drink of alcohol    Types: 1 Standard drinks or equivalent per week    Comment: with dinner GLASS OF WINE EACH DAY   Drug use: No   Sexual activity: Not on file  Other Topics Concern   Not on file  Social History Narrative   Lives in [place] with husband. 2 children, son lives nearby.Diet - regularExercise - none. 30 mins/in [place]. Quit smoking in 2009. Wine before dinner. Pianist in church; real estate; Diplomatic Services operational officer.    Social Drivers of Corporate investment banker Strain: Low Risk  (11/23/2023)   Overall Financial Resource Strain (CARDIA)    Difficulty of Paying Living Expenses: Not hard at all  Food Insecurity: No Food Insecurity (11/23/2023)   Hunger Vital Sign    Worried About Running Out of Food in the Last Year: Never true    Ran Out of Food in the Last Year: Never true  Transportation Needs: No Transportation Needs (11/23/2023)   PRAPARE - Administrator, Civil Service (Medical): No    Lack of Transportation (Non-Medical): No  Physical Activity: Insufficiently Active (11/23/2023)   Exercise Vital Sign    Days of Exercise per Week: 1 day    Minutes of Exercise per Session: 30 min  Stress: No Stress Concern Present (11/23/2023)   [employer] of Occupational Health - Occupational Stress Questionnaire    Feeling of Stress: Not at all  Social Connections: Socially Integrated (11/23/2023)   Social Connection and Isolation Panel    Frequency of Communication with Friends and Family: More than three times a week    Frequency of Social Gatherings with Friends and Family: Three times a week    Attends Religious Services: More than 4 times per year    Active Member of  Clubs or Organizations: Yes    Attends Banker Meetings: More than 4 times per year    Marital Status: Married  Catering manager Violence: Not At Risk (11/27/2023)   Humiliation, Afraid, Rape, and Kick questionnaire    Fear of Current or Ex-Partner: No    Emotionally Abused: No    Physically Abused: No    Sexually Abused: No    FAMILY HISTORY: Family History  Problem Relation Age of Onset   Stroke Mother    Diabetes Mother    Colon cancer Father        dx 60s   Stroke Sister    Colon cancer Brother        dx 52s-70s   Brain cancer Brother        dx 60s-70s   Breast cancer Neg Hx     ALLERGIES:  is allergic to sulfa antibiotics.  MEDICATIONS:  Current Outpatient Medications  Medication Sig Dispense Refill   anastrozole  (ARIMIDEX ) 1 MG tablet TAKE 1 TABLET BY MOUTH DAILY 90 tablet 0   atorvastatin  (LIPITOR) 10 MG tablet Take 1 tablet (10 mg total) by mouth daily. 90 tablet 3   budesonide-glycopyrrolate -formoterol (BREZTRI  AEROSPHERE) 160-9-4.8 MCG/ACT AERO inhaler Inhale 2 puffs into the lungs in the morning and at bedtime. 1 each 11   furosemide  (LASIX ) 20 MG tablet TAKE 1 TABLET BY MOUTH EVERY OTHER DAY 30 tablet 1   halobetasol  (ULTRAVATE ) 0.05 % cream Apply topically to aa's of rash at body twice daily  on weekends only as needed. Avoid applying to face, groin, and axilla. Use as directed. 50 g 1   KLOR-CON  M20 20 MEQ tablet TAKE 1 TABLET BY MOUTH EVERY OTHER DAY 30 tablet 0   loratadine (CLARITIN) 10 MG tablet Take 10 mg by mouth daily.     niacinamide 500 MG tablet Take 500 mg by mouth 2 (two) times daily.     Omega-3 Fatty Acids (FISH OIL PO) Take 1 capsule by mouth daily.     Spacer/Aero-Holding Chambers (AEROCHAMBER MV) inhaler Use as instructed 1 each 0   tacrolimus  (PROTOPIC ) 0.1 % ointment Apply topically to affected areas of rash twice daily on week days only prn 60 g 1   abemaciclib  (VERZENIO ) 50 MG tablet Take 1 tablet (50 mg total) by mouth 2 (two)  times daily. (Patient not taking: Reported on 01/08/2024) 56 tablet 3   No current facility-administered medications for this visit.   PHYSICAL EXAMINATION:   Vitals:   01/08/24 1431 01/08/24 1454  BP: (!) 166/85 (!) 173/102  Pulse: 73   Resp: 18   Temp: (!) 97.2 F (36.2 C)   SpO2: 100%     Filed Weights   01/08/24 1431  Weight: 174 lb 12.8 oz (79.3 kg)    Physical Exam Vitals and nursing note reviewed.  HENT:     Head: Normocephalic and atraumatic.     Mouth/Throat:     Pharynx: Oropharynx is clear.  Eyes:     Extraocular Movements: Extraocular movements intact.     Pupils: Pupils are equal, round, and reactive to light.  Cardiovascular:     Rate and Rhythm: Normal rate and regular rhythm.     Heart sounds: Murmur heard.  Pulmonary:     Comments: Decreased breath sounds bilaterally.  Abdominal:     Palpations: Abdomen is soft.  Musculoskeletal:        General: Normal range of motion.     Cervical back: Normal range of motion.  Skin:    General: Skin is warm.  Neurological:     General: No focal deficit present.     Mental Status: She is alert and oriented to person, place, and time.  Psychiatric:        Behavior: Behavior normal.        Judgment: Judgment normal.      LABORATORY DATA:  I have reviewed the data as listed Lab Results  Component Value Date   WBC 6.8 01/08/2024   HGB 11.2 (L) 01/08/2024   HCT 33.8 (L) 01/08/2024   MCV 106.3 (H) 01/08/2024   PLT 212 01/08/2024   Recent Labs    10/31/23 1259 12/05/23 1306 01/08/24 1443  NA 137 135 135  K 4.7 4.2 4.0  CL 104 101 101  CO2 23 23 24   GLUCOSE 107* 103* 111*  BUN 22 22 23   CREATININE 1.45* 1.40* 0.94  CALCIUM  9.9 10.5* 9.6  GFRNONAA 36* 38* >60  PROT 7.0 7.1 7.0  ALBUMIN 4.1 3.9 4.2  AST 23 22 29   ALT 16 14 20   ALKPHOS 46 51 49  BILITOT 0.7 0.7 0.8    RADIOGRAPHIC STUDIES: I have personally reviewed the radiological images as listed and agreed with the findings in the  report. No results found.  ASSESSMENT & PLAN:   Carcinoma of overlapping sites of right breast in female, estrogen receptor positive (HCC) # Right breast cancer -[facility]; ER/PR positive Her 2 Negative ;  G-3; LVI + T2N1.  Stage II [OCT 2023;  Dr.Byrnett] s/p Lumpectomy; node positive-Oncotype RS- 35- s/p  adjuvant chemotherapy with Taxotere -Cytoxan  every 3 weeks x4 cycles.  S/p postlumpectomy radiation April 3rd 2024. Poor tolerance to adjuvant abemaciclib  even at 50 mg BID [started second week of June- stopped in AUG 2025].  # Adjuvant abemaciclib  discontinued because of poor tolerance.  Given the high risk features-recommend adjuvant CDK inhibitor- Kisquali x3 years.  Continue Anastrazole x10 years.   I reviewed the potential side effects of CDK-inhibitors including not limited to- GI side effects-nausea vomiting diarrhea, skin rash liver abnormalities-also low blood counts.  But overall well-tolerated.  Patient will need EKG at baseline and also about 2 weeks post starting therapy.  Ds cussed with pharmacy.   # # BIL LE weakness-sec to  verzinio-improved since stopping Verzenio .  Status post evaluation with [facility].  Also will make a referral to care program.  # Bone health-2023 osteopenic T-score of -1.1.Continue calcium  plus vitamin D .- ON [JAN 2025- #1 of planned- 6] adjuvant Zometa  every 6 months x 3 years. S/p dental evaluation - stable. Continue adjuvant Zometa - reduce the dose to 3 mg [given GFR 36]  # COPD [Dr.Dgayli]-on Breztri /albuterol  -worse- would recommend evaluation with pulmonary.    # [2nd,FEB 2024] CTA-  acute pulmonary embolism/ LEFT LE- Acute DVT-- [provoked sec to chemo]. Stopped eliquis  in End of  AUG 2024. Stable.    # CKD- stage III- monitor for now./ APRIL 2025- CA 10.4; ca+vitD on stable.    # Vaccination: s/p COVID; Flu; OK with RSV.   # Fatigue: Patient has no other identifiable causes of fatigue at this time.  I had a long discussion with the patient regarding the  ability of structured exercise program help treat cancer related fatigue.  Introduced the care program available to cancer patients at [facility]. Patient is interested.    # IV Access: PIV   # Zometa  - 7/11-2023 q 6 m [3mg ]  # DISPOSITION: # Refer to care program: re: Fatigue  # follow in 6  weeks- MD;labs- cbc/cmp; Mag-EKG-  Dr.B    All questions were answered. The patient/family knows to call the clinic with any problems, questions or concerns.    Cindy JONELLE Joe, MD 01/08/2024 4:06 PM

## 2024-01-08 NOTE — Telephone Encounter (Signed)
 Oral Oncology Patient Advocate Encounter   New authorization   Received notification that prior authorization for Kisqali  is required.   PA submitted on CMM via Latent Key [id number]  Status is pending     [name], CPhT  [facility], [place], [facility] - Oral Chemotherapy Patient Advocate Specialist III Phone: [phone]  Fax: [phone]

## 2024-01-08 NOTE — Progress Notes (Signed)
 Flu vaccine given last Thursday, [employer]. Made note in her immunization tab.

## 2024-01-08 NOTE — Assessment & Plan Note (Addendum)
#   Right breast cancer -[facility]; ER/PR positive Her 2 Negative ;  G-3; LVI + T2N1.  Stage II [OCT 2023; Dr.Byrnett] s/p Lumpectomy; node positive-Oncotype RS- 35- s/p  adjuvant chemotherapy with Taxotere -Cytoxan  every 3 weeks x4 cycles.  S/p postlumpectomy radiation April 3rd 2024. Poor tolerance to adjuvant abemaciclib  even at 50 mg BID [started second week of June- stopped in AUG 2025].  # Adjuvant abemaciclib  discontinued because of poor tolerance.  Given the high risk features-recommend adjuvant CDK inhibitor- Kisquali x3 years.  Continue Anastrazole x10 years.   I reviewed the potential side effects of CDK-inhibitors including not limited to- GI side effects-nausea vomiting diarrhea, skin rash liver abnormalities-also low blood counts.  But overall well-tolerated.  Patient will need EKG at baseline and also about 2 weeks post starting therapy.  Ds cussed with pharmacy.   # # BIL LE weakness-sec to  verzinio-improved since stopping Verzenio .  Status post evaluation with [facility].  Also will make a referral to care program.  # Bone health-2023 osteopenic T-score of -1.1.Continue calcium  plus vitamin D .- ON [JAN 2025- #1 of planned- 6] adjuvant Zometa  every 6 months x 3 years. S/p dental evaluation - stable. Continue adjuvant Zometa - reduce the dose to 3 mg [given GFR 36]  # COPD [Dr.Dgayli]-on Breztri /albuterol  -worse- would recommend evaluation with pulmonary.    # [2nd,FEB 2024] CTA-  acute pulmonary embolism/ LEFT LE- Acute DVT-- [provoked sec to chemo]. Stopped eliquis  in End of  AUG 2024. Stable.    # CKD- stage III- monitor for now./ APRIL 2025- CA 10.4; ca+vitD on stable.    # Vaccination: s/p COVID; Flu; OK with RSV.   # Fatigue: Patient has no other identifiable causes of fatigue at this time.  I had a long discussion with the patient regarding the ability of structured exercise program help treat cancer related fatigue.  Introduced the care program available to cancer patients at [facility].  Patient is interested.    # IV Access: PIV   # Zometa  - 7/11-2023 q 6 m [3mg ]  # DISPOSITION: # Refer to care program: re: Fatigue  # follow in 6  weeks- MD;labs- cbc/cmp; Mag-EKG-  Dr.B

## 2024-01-09 ENCOUNTER — Other Ambulatory Visit: Payer: Self-pay | Admitting: Pharmacy Technician

## 2024-01-09 ENCOUNTER — Telehealth: Payer: Self-pay | Admitting: Pharmacy Technician

## 2024-01-09 ENCOUNTER — Other Ambulatory Visit (HOSPITAL_COMMUNITY): Payer: Self-pay

## 2024-01-09 ENCOUNTER — Other Ambulatory Visit: Payer: Self-pay

## 2024-01-09 ENCOUNTER — Encounter: Payer: Self-pay | Admitting: Internal Medicine

## 2024-01-09 MED ORDER — RIBOCICLIB SUCC (400 MG DOSE) 200 MG PO TBPK
400.0000 mg | ORAL_TABLET | Freq: Every day | ORAL | 1 refills | Status: DC
  Filled 2024-01-09: qty 42, 28d supply, fill #0
  Filled 2024-02-02: qty 42, 28d supply, fill #1

## 2024-01-09 NOTE — Progress Notes (Signed)
 Specialty Pharmacy Initiation Note   Sabrina Wilson is a 83 y.o. female who will be followed by the specialty pharmacy service for RxSp Oncology    Review of administration, indication, effectiveness, safety, potential side effects, storage/disposable, and missed dose instructions occurred today for patient's specialty medication(s) No data recorded    Patient/Caregiver did not have any additional questions or concerns.   Patient's therapy is appropriate to: Initiate    Goals Addressed             This Visit's Progress    Achieve a cure       Patient Stated          [name] Specialty Pharmacist

## 2024-01-09 NOTE — Telephone Encounter (Signed)
 Patient successfully OnBoarded and drug education provided by pharmacist. Medication scheduled to be shipped on 01/10/2024 for delivery on 01/11/2024 from [facility] to patient's address. Patient also knows to call me at [phone] with any questions or concerns regarding receiving medication or if there is any unexpected change in co-pay.   Sabrina Wilson (Patty) [name], CPhT  [facility], [place], [facility] - Oral Chemotherapy Patient Advocate Specialist III Phone: [phone]  Fax: [phone]

## 2024-01-09 NOTE — Telephone Encounter (Signed)
 Oral Oncology Patient Advocate Encounter  Prior Authorization for Kisqali  has been approved.    PA# [id number]  Effective dates: 01/09/2024 through 01/08/2025  Patients co-pay is $0.    [place] (Patty) [name], CPhT  [facility], Zelda Salmon, [facility] - Oral Chemotherapy Patient Advocate Specialist III Phone: [phone]  Fax: [phone]

## 2024-01-09 NOTE — Progress Notes (Signed)
 Specialty Pharmacy Initial Fill Coordination Note  Sabrina Wilson is a 83 y.o. female contacted today regarding refills of specialty medication(s) Ribociclib  Succinate (KISQALI  400mg  Daily Dose) .  Patient requested Delivery  on 01/11/24  to verified address [address] [place] [address]   Medication will be filled on 01/10/2024.   Patient is aware of $0 copayment.    Nicollette Wilhelmi (Patty) [name], CPhT  [facility], [place], [facility] - Oral Chemotherapy Patient Advocate Specialist III Phone: [phone]  Fax: [phone]

## 2024-01-15 ENCOUNTER — Inpatient Hospital Stay: Admitting: Pharmacist

## 2024-01-15 ENCOUNTER — Other Ambulatory Visit: Payer: Self-pay

## 2024-01-15 ENCOUNTER — Encounter: Payer: Self-pay | Admitting: Pharmacist

## 2024-01-15 DIAGNOSIS — Z1732 Human epidermal growth factor receptor 2 negative status: Secondary | ICD-10-CM | POA: Diagnosis not present

## 2024-01-15 DIAGNOSIS — Z86711 Personal history of pulmonary embolism: Secondary | ICD-10-CM | POA: Diagnosis not present

## 2024-01-15 DIAGNOSIS — I10 Essential (primary) hypertension: Secondary | ICD-10-CM | POA: Diagnosis not present

## 2024-01-15 DIAGNOSIS — Z9221 Personal history of antineoplastic chemotherapy: Secondary | ICD-10-CM | POA: Diagnosis not present

## 2024-01-15 DIAGNOSIS — C50811 Malignant neoplasm of overlapping sites of right female breast: Secondary | ICD-10-CM | POA: Diagnosis not present

## 2024-01-15 DIAGNOSIS — Z170 Estrogen receptor positive status [ER+]: Secondary | ICD-10-CM

## 2024-01-15 DIAGNOSIS — Z923 Personal history of irradiation: Secondary | ICD-10-CM | POA: Diagnosis not present

## 2024-01-15 DIAGNOSIS — Z1721 Progesterone receptor positive status: Secondary | ICD-10-CM | POA: Diagnosis not present

## 2024-01-15 DIAGNOSIS — Z86718 Personal history of other venous thrombosis and embolism: Secondary | ICD-10-CM | POA: Diagnosis not present

## 2024-01-15 DIAGNOSIS — Z79811 Long term (current) use of aromatase inhibitors: Secondary | ICD-10-CM | POA: Diagnosis not present

## 2024-01-15 DIAGNOSIS — Z87891 Personal history of nicotine dependence: Secondary | ICD-10-CM | POA: Diagnosis not present

## 2024-01-15 NOTE — Progress Notes (Signed)
 Clinical Pharmacist Practitioner Clinic [facility]  [phone] [phone]  Patient Care Team: Bair, Kalpana, MD as PCP - General (Family Medicine) [name], MD as PCP - Cardiology (Cardiology) Dessa, [name], MD as Consulting Physician (General Surgery) [name], MD (Internal Medicine) [name], RN as Oncology Nurse Navigator [name], MD as Consulting Physician (Radiation Oncology) [name], MD as Consulting Physician (Oncology) [name], DO as Consulting Physician (General Surgery)   Name of the patient: Sabrina Wilson  [id number]  1941-04-09   Date of visit: 01/15/24   HPI: Patient is a 83 y.o. female with early stage HR+/HER2- breast cancer. Patient switching from abemaciclib  (started 09/2022) due to intolerance to ribociclib . She will start her ribociclib  today 01/15/24.  Reason for Consult: Ribociclib  oral chemotherapy education.   PAST MEDICAL HISTORY: Past Medical History:  Diagnosis Date   Actinic keratosis 02/12/2020   R lat calf (hypertrophic)   Arthritis    Cancer of sigmoid (HCC) 06/17/2016   Colon cancer (HCC) 2009   T3,N0; s/p resection  Dr. Primus and chemotherapy by Dr. Brooks   Diastolic dysfunction    a. 08/2020 Echo: EF 60-65%, no rwma, Gr1 DD, nl RV size/fxn. Mild MR.   DVT (deep venous thrombosis) (HCC) 05/29/2022   Hernia of flank    History of actinic keratoses 08/11/2020   Bx proven at left lateral ankle proximal, LN2 10/08/20   History of stress test    a. 09/2020 MV: EF >65%. No ischemia/infarct. Mild Ao Ca2+ and minimal Cor Ca2+.   Hypertension    Hypomagnesemia 05/29/2022   Hyponatremia 05/29/2022   Neuropathy    Personal history of radiation therapy 2023   right breast CA   Polyp at cervical os    s/p resection Dr. Cleotilde   Pulmonary embolism (HCC) 05/27/2022   Skin cancer 01/22/2020   surface of an atypical verrucous squamous proliferation    Squamous  cell carcinoma of skin 07/16/2020   Left lateral ankle anterior, treated with [facility] 08-11-2020    HEMATOLOGY/ONCOLOGY HISTORY:  Oncology History Overview Note  # 2009- Sigmoid colon cancer stage II (T3 N0 4 lymph nodes sampled M0 stage II high risk there was obstruction and perforation abscess; Dr.Hern), workup included a low CEA preoperatively and a chest x-ray and CT scan abdomen pelvis negative for metastatic disease, S/P colostomy; s/p FOLFOX x 6 months. [Dr.Gittin ]  # LATER REVERSED , K RAS  wild type B RAF not evaluated   Also initial iron deficiency with anemia thrombocytosis, high platelets considered reactive, workup includes a normal PCR for CML and a negative [id number] mutation  DIAGNOSIS:  A. BREAST, RIGHT; LUMPECTOMY:  - INVASIVE MAMMARY CARCINOMA.  - DUCTAL CARCINOMA IN SITU (DCIS).  - SEE CANCER SUMMARY BELOW.  - BIOPSY SITE CHANGE WITH CLIP (2).  - FIBROUS TISSUE WITH CYST FORMATION AND FLORID DUCTAL HYPERPLASIA.  - SKIN AND NIPPLE NEGATIVE FOR MALIGNANCY.  - VASCULAR CALCIFICATION.   B. SENTINEL LYMPH NODES 1 AND 2, RIGHT AXILLA; EXCISION:  - METASTATIC CARCINOMA INVOLVES ONE OF TWO LYMPH NODES (1/2).   C. NON-SENTINEL LYMPH NODES, RIGHT AXILLA; EXCISION:  - ONE LYMPH NODE NEGATIVE FOR MALIGNANCY (0/1).   CANCER CASE SUMMARY: INVASIVE CARCINOMA OF THE BREAST  Standard(s): AJCC-UICC 8   SPECIMEN  Procedure: Lumpectomy  Specimen Laterality: Right   TUMOR  Histologic Type: Invasive carcinoma of no special type (ductal)  Histologic Grade (Nottingham Histologic Score)       Glandular (  Acinar)/Tubular Differentiation: 3       Nuclear Pleomorphism: 3       Mitotic Rate: 3       Overall Grade: 3  Tumor Size: 24 mm  Tumor Focality: Single focus of invasive carcinoma  Ductal Carcinoma In Situ (DCIS): Present, nuclear grade 2-3  Tumor Extent: Not applicable  Lymphatic and/or Vascular Invasion: Present  Treatment Effect in the Breast: No known presurgical therapy    MARGINS  Margin Status for Invasive Carcinoma: All margins negative for invasive  carcinoma       Distance from closest margin: 10 mm       Specify closest margin: Superior   Margin Status for DCIS: All margins negative for DCIS       Distance from DCIS to closest margin: 10 mm       Specify closest margin: Superior   REGIONAL LYMPH NODES  Regional Lymph Node Status: Tumor present in regional lymph node(s)       Number of Lymph Nodes with Macrometastases (greater than 2 mm): 1       Number of Lymph Nodes with Micrometastases (greater than 0.2 mm to  2 mm and/or greater than 200 cells): 0       Number of Lymph Nodes with Isolated Tumor Cells (0.2 mm or less OR  200 cells or less): 0       Size of Largest Metastatic Deposit: 3 mm      Extranodal Extension: Not identified       Total Number of Lymph Nodes Examined (sentinel and non-sentinel): 3       Number of Sentinel Nodes Examined: 2   DISTANT METASTASIS  Distant Site(s) Involved, if applicable: Not applicable   PATHOLOGIC STAGE CLASSIFICATION (pTNM, AJCC 8th Edition):  Modified Classification: Not applicable  pT Category: pT2  T Suffix: Not applicable  pN Category: pN1a  N Suffix: (sn)  pM Category: Not applicable   SPECIAL STUDIES  Breast Biomarker Testing Performed on Previous Outside Biopsy:  [id number]  Per outside report:  Estrogen Receptor (ER) Status: POSITIVE          Percentage of cells with nuclear positivity: Greater than 90%          Average intensity of staining: Strong   Progesterone Receptor (PgR) Status: POSITIVE          Percentage of cells with nuclear positivity: 60%          Average intensity of staining: Strong   HER2 (by immunohistochemistry): EQUIVOCAL (Score 2+)  HER2 FISH: NEGATIVE   Ki-67: Not performed   # Cytoxan  Taxotere  x 4 cycles; finished JAN, 2024. [Neutropenic fevers s/p cycle #4;   -FEB 2nd, 2024-right lung PE; left lower LE  DVT- on eliquis .   # FEB 19th-start RT; mpectomy  radiation April 3rd 2024.  # anastrozole  once a day [April 2024]-t # Poor tolerance to adjuvant abemaciclib  100 mg BID [started second week of June] stopped x 1 month.    # JAN 8th 2024- start abemaciclib  50 mg BID- STOPPED in AUG 2025- sec to intolerance.   # LATE SEP 2025- start RIBOCICLIB  400 mg 3 w- on and 1 week OFF      Cancer of sigmoid (HCC) (Resolved)  Carcinoma of overlapping sites of right breast in female, estrogen receptor positive (HCC)  02/14/2022 Initial Diagnosis   Carcinoma of overlapping sites of right breast in female, estrogen receptor positive (HCC)   02/14/2022 Cancer Staging   Staging form: Breast, AJCC 8th  Edition - Pathologic: Stage IIA (pT2, pN1, cM0, G3, ER+, PR+, HER2-) - Signed by [name], MD on 02/14/2022 Histologic grading system: 3 grade system   02/23/2022 -  Chemotherapy   Patient is on Treatment Plan : BREAST TC q21d       ALLERGIES:  is allergic to sulfa antibiotics.  MEDICATIONS:  Current Outpatient Medications  Medication Sig Dispense Refill   anastrozole  (ARIMIDEX ) 1 MG tablet TAKE 1 TABLET BY MOUTH DAILY 90 tablet 0   atorvastatin  (LIPITOR) 10 MG tablet Take 1 tablet (10 mg total) by mouth daily. 90 tablet 3   budesonide-glycopyrrolate -formoterol (BREZTRI  AEROSPHERE) 160-9-4.8 MCG/ACT AERO inhaler Inhale 2 puffs into the lungs in the morning and at bedtime. 1 each 11   furosemide  (LASIX ) 20 MG tablet TAKE 1 TABLET BY MOUTH EVERY OTHER DAY 30 tablet 1   halobetasol  (ULTRAVATE ) 0.05 % cream Apply topically to aa's of rash at body twice daily on weekends only as needed. Avoid applying to face, groin, and axilla. Use as directed. 50 g 1   KLOR-CON  M20 20 MEQ tablet TAKE 1 TABLET BY MOUTH EVERY OTHER DAY 30 tablet 0   loratadine (CLARITIN) 10 MG tablet Take 10 mg by mouth daily.     niacinamide 500 MG tablet Take 500 mg by mouth 2 (two) times daily.     Omega-3 Fatty Acids (FISH OIL PO) Take 1 capsule by mouth daily.      ribociclib  succ (KISQALI  400MG  DAILY DOSE) 200 MG Therapy Pack Take 2 tablets (400 mg total) by mouth daily. Take for 21 days on, 7 days off, repeat every 28 days. 42 tablet 1   Spacer/Aero-Holding Chambers (AEROCHAMBER MV) inhaler Use as instructed 1 each 0   tacrolimus  (PROTOPIC ) 0.1 % ointment Apply topically to affected areas of rash twice daily on week days only prn 60 g 1   No current facility-administered medications for this visit.    VITAL SIGNS: BP (!) 173/89 (BP Location: Left Arm, Patient Position: Sitting)   Pulse 76   Temp (!) 96.2 F (35.7 C) (Tympanic)   Resp 16   Ht 5' 6 (1.676 m)   Wt 78.9 kg (174 lb) Comment: Pt BP elevated, will recheck after provider visit.  Pt reports she takes BP at home.  SpO2 96%   BMI 28.08 kg/m  Filed Weights   01/15/24 1509  Weight: 78.9 kg (174 lb)    Estimated body mass index is 28.08 kg/m as calculated from the following:   Height as of this encounter: 5' 6 (1.676 m).   Weight as of this encounter: 78.9 kg (174 lb).  LABS: CBC:    Component Value Date/Time   WBC 6.8 01/08/2024 1443   WBC 5.8 07/14/2022 1505   HGB 11.2 (L) 01/08/2024 1443   HGB 12.4 05/15/2013 1445   HCT 33.8 (L) 01/08/2024 1443   HCT 38.2 05/15/2013 1445   PLT 212 01/08/2024 1443   PLT 255 05/15/2013 1445   MCV 106.3 (H) 01/08/2024 1443   MCV 96 05/15/2013 1445   NEUTROABS 4.3 01/08/2024 1443   NEUTROABS 3.9 05/15/2013 1445   LYMPHSABS 1.2 01/08/2024 1443   LYMPHSABS 2.0 05/15/2013 1445   MONOABS 0.7 01/08/2024 1443   MONOABS 0.6 05/15/2013 1445   EOSABS 0.4 01/08/2024 1443   EOSABS 0.4 05/15/2013 1445   BASOSABS 0.1 01/08/2024 1443   BASOSABS 0.1 05/15/2013 1445   Comprehensive Metabolic Panel:    Component Value Date/Time   NA 135 01/08/2024 1443  NA 140 10/03/2023 1544   NA 133 (L) 07/15/2012 2105   K 4.0 01/08/2024 1443   K 3.8 07/18/2012 0503   CL 101 01/08/2024 1443   CL 100 07/15/2012 2105   CO2 24 01/08/2024 1443   CO2 28  07/15/2012 2105   BUN 23 01/08/2024 1443   BUN 27 10/03/2023 1544   BUN 10 07/15/2012 2105   CREATININE 0.94 01/08/2024 1443   CREATININE 0.68 07/15/2012 2105   GLUCOSE 111 (H) 01/08/2024 1443   GLUCOSE 136 (H) 07/15/2012 2105   CALCIUM  9.6 01/08/2024 1443   CALCIUM  8.4 (L) 07/15/2012 2105   AST 29 01/08/2024 1443   ALT 20 01/08/2024 1443   ALT 24 06/22/2011 1323   ALKPHOS 49 01/08/2024 1443   ALKPHOS 72 06/22/2011 1323   BILITOT 0.8 01/08/2024 1443   PROT 7.0 01/08/2024 1443   PROT 7.5 06/22/2011 1323   ALBUMIN 4.2 01/08/2024 1443   ALBUMIN 3.8 06/22/2011 1323     Present during today's visit: patient only  Start plan: Patient will start ribociclib  today 01/15/24   ECG from today reviewed, patient is good to begin treatment today  Patient Education I spoke with patient for overview of new oral chemotherapy medication: ribociclib    Treatment goal: Curative  Administration: Counseled patient on administration, dosing, side effects, monitoring, drug-food interactions, safe handling, storage, and disposal. Patient will take 2 tablets (400 mg total) by mouth daily. Take for 21 days on, 7 days off, repeat every 28 days.   Side Effects: Side effects include but not limited to: nausea, fatigue, diarrhea, decreased wbc/hgb/plt. Diarrhea: patient knows to use loperamide as needed and call the office if they are having four or more loose stool per day     Adherence: After discussion with patient no patient barriers to medication adherence identified.  Reviewed with patient importance of keeping a medication schedule and plan for any missed doses.  Distress evaluation: Distress thermometer not completed during telephone call as patient has been on previous lines of therapy.   Communication and Learning Assessment Primary learner: patient  Barriers to learning: No barriers Preferred language: English Learning preferences: Listening Reading  Ms. Monaco voiced understanding and  appreciation. All questions answered. Medication handout provided.  Provided patient with Oral Chemotherapy Navigation Clinic phone number. Patient knows to call the office with questions or concerns. Oral Chemotherapy Navigation Clinic will continue to follow.  Patient expressed understanding and was in agreement with this plan. She also understands that She can call clinic at any time with any questions, concerns, or complaints.   Medication Access Issues: No issue, patient fills at [facility] (Specialty)  Follow-up plan: RTC in 2 weeks  Thank you for allowing me to participate in the care of this patient.   Time Total: 15 mins  Visit consisted of counseling and education on dealing with issues of symptom management in the setting of serious and potentially life-threatening illness.Greater than 50%  of this time was spent counseling and coordinating care related to the above assessment and plan.  Signed by: Gee Habig N. Evonte Prestage, PharmD, NEILA, CPP Hematology/Oncology Clinical Pharmacist Practitioner [place] [phone]  01/15/2024 3:47 PM

## 2024-01-15 NOTE — Progress Notes (Signed)
 EKG done for oral chemo pharmacy provider. Pt BP elevated, will recheck after provider visit.  Pt reports she takes BP at home.

## 2024-01-16 ENCOUNTER — Encounter: Attending: Radiation Oncology

## 2024-01-16 VITALS — Ht 66.9 in | Wt 175.6 lb

## 2024-01-16 DIAGNOSIS — Z170 Estrogen receptor positive status [ER+]: Secondary | ICD-10-CM

## 2024-01-16 NOTE — Progress Notes (Signed)
 Daily Session Note  Patient Details  Name: Sabrina Wilson MRN: [id number] Date of Birth: 1941-02-07 Referring Provider:   Conrad Ports [facility] Rehabilitation & Exercise from 01/16/2024 in [facility] Cardiac and Pulmonary Rehab  Referring Provider Rennie Lighter, MD    Encounter Date: 01/16/2024  Check In:  Session Check In - 01/16/24 1418       Check-In   Supervising physician immediately available to respond to emergencies See telemetry face sheet for immediately available ER MD    Location [facility]-Cardiac & Pulmonary Rehab    Staff Present Rollene Paterson, MS, Exercise Physiologist;Noah Tickle, BS, Exercise Physiologist    Virtual Visit No    Medication changes reported     No    Fall or balance concerns reported    No    Warm-up and Cool-down Performed on first and last piece of equipment   & and gym orientation   Resistance Training Performed Yes    VAD Patient? No    PAD/SET Patient? No      Pain Assessment   Currently in Pain? No/denies    Multiple Pain Sites No            Exercise Prescription Changes - 01/16/24 1400       Response to Exercise   Blood Pressure (Admit) 150/80    Blood Pressure (Exercise) 160/70    Blood Pressure (Exit) 120/78    Heart Rate (Admit) 68 bpm    Heart Rate (Exercise) 79 bpm    Heart Rate (Exit) 80 bpm    Oxygen Saturation (Admit) 95 %    Oxygen Saturation (Exercise) 98 %    Oxygen Saturation (Exit) 98 %    Rating of Perceived Exertion (Exercise) 12    Perceived Dyspnea (Exercise) 1    Symptoms Bilateral hip fatigue    Comments & 1st day results      Progression   Average METs 1.82          Social History   Tobacco Use  Smoking Status Former   Current packs/day: 0.00   Types: Cigarettes   Quit date: 10/12/2007   Years since quitting: 16.2  Smokeless Tobacco Never    Goals Met:  Independence with exercise equipment Exercise tolerated well No report of concerns or symptoms today  Goals Unmet:   Not Applicable  Comments: First full day of exercise!  Patient was oriented to gym and equipment including functions, settings, policies, and procedures.  Patient's individual exercise prescription and treatment plan were reviewed.  All starting workloads were established based on the results of the 6 minute walk test done today. The results are listed below. The plan for exercise progression was also introduced and progression will be customized based on patient's performance and goals.   6 Minute Walk     Row Name 01/16/24 1419         6 Minute Walk   Phase Initial     Distance 1020 feet     Walk Time 6 minutes     # of Rest Breaks 0     MPH 1.93     METS 1.82     RPE 12     Perceived Dyspnea  1     VO2 Peak 6.38     Symptoms Yes (comment)     Comments Bilateral hip fatigue     Resting HR 68 bpm     Resting BP 150/80     Resting Oxygen Saturation  95 %  Exercise Oxygen Saturation  during 6 min walk 98 %     Max Ex. HR 79 bpm     Max Ex. BP 160/70     2 Minute Post BP 160/78         Dr. Oneil Pinal is Medical Director for [facility] Cardiac Rehabilitation.  Dr. Fuad Aleskerov is Medical Director for [facility] Pulmonary Rehabilitation.

## 2024-01-16 NOTE — Patient Instructions (Signed)
 Patient Instructions  Patient Details  Name: Sabrina Wilson MRN: [id number] Date of Birth: 1941/03/02 Referring Provider:  Abbey Bruckner, MD  Below are your personal goals for exercise, nutrition, and risk factors. Our goal is to help you stay on track towards obtaining and maintaining these goals. We will be discussing your progress on these goals with you throughout the program.  Initial Exercise Prescription:  Initial Exercise Prescription - 01/16/24 1400       Date of Initial Exercise RX and Referring Provider   Date 01/16/24    Referring Provider Brahmanday, Govinda, MD      Oxygen   Maintain Oxygen Saturation 88% or higher      Recumbant Bike   Level 1    RPM 50    Watts 15    Minutes 15    METs 1.93      NuStep   Level 1    SPM 80    Minutes 15    METs 1.93      Biostep-RELP   Level 1    SPM 50    Minutes 15    METs 1.93      Track   Laps 24    Minutes 15    METs 2.31      Prescription Details   Frequency (times per week) 2    Duration Progress to 30 minutes of continuous aerobic without signs/symptoms of physical distress      Intensity   THRR 40-80% of Max Heartrate 96-125    Ratings of Perceived Exertion 11-13    Perceived Dyspnea 0-4      Progression   Progression Continue to progress workloads to maintain intensity without signs/symptoms of physical distress.      Resistance Training   Training Prescription Yes    Weight 2 lb    Reps 10-15          Exercise Goals: Frequency: Be able to perform aerobic exercise two to three times per week in program working toward 2-5 days per week of home exercise.  Intensity: Work with a perceived exertion of 11 (fairly light) - 15 (hard) while following your exercise prescription.  We will make changes to your prescription with you as you progress through the program.   Duration: Be able to do 30 to 45 minutes of continuous aerobic exercise in addition to a 5 minute warm-up and a 5 minute cool-down  routine.   Nutrition Goals: Your personal nutrition goals will be established when you do your nutrition analysis with the dietician.  The following are general nutrition guidelines to follow: Cholesterol < 200mg /day Sodium < 1500mg /day Fiber: Women over 50 yrs - 21 grams per day   Tobacco Use Initial Evaluation: Social History   Tobacco Use  Smoking Status Former   Current packs/day: 0.00   Types: Cigarettes   Quit date: 10/12/2007   Years since quitting: 16.2  Smokeless Tobacco Never    Exercise Goals and Review:  Exercise Goals     Row Name 01/16/24 1425             Exercise Goals   Increase Physical Activity Yes       Intervention Provide advice, education, support and counseling about physical activity/exercise needs.;Develop an individualized exercise prescription for aerobic and resistive training based on initial evaluation findings, risk stratification, comorbidities and participant's personal goals.       Expected Outcomes Short Term: Attend rehab on a regular basis to increase amount of physical activity.;Long Term:  Add in home exercise to make exercise part of routine and to increase amount of physical activity.;Long Term: Exercising regularly at least 3-5 days a week.       Increase Strength and Stamina Yes       Intervention Provide advice, education, support and counseling about physical activity/exercise needs.;Develop an individualized exercise prescription for aerobic and resistive training based on initial evaluation findings, risk stratification, comorbidities and participant's personal goals.       Expected Outcomes Short Term: Increase workloads from initial exercise prescription for resistance, speed, and METs.;Short Term: Perform resistance training exercises routinely during rehab and add in resistance training at home;Long Term: Improve cardiorespiratory fitness, muscular endurance and strength as measured by increased METs and functional capacity ( )        Able to understand and use rate of perceived exertion (RPE) scale Yes       Intervention Provide education and explanation on how to use RPE scale       Expected Outcomes Short Term: Able to use RPE daily in rehab to express subjective intensity level;Long Term:  Able to use RPE to guide intensity level when exercising independently       Able to understand and use Dyspnea scale Yes       Intervention Provide education and explanation on how to use Dyspnea scale       Expected Outcomes Short Term: Able to use Dyspnea scale daily in rehab to express subjective sense of shortness of breath during exertion;Long Term: Able to use Dyspnea scale to guide intensity level when exercising independently       Knowledge and understanding of Target Heart Rate Range (THRR) Yes       Intervention Provide education and explanation of THRR including how the numbers were predicted and where they are located for reference       Expected Outcomes Short Term: Able to state/look up THRR;Short Term: Able to use daily as guideline for intensity in rehab;Long Term: Able to use THRR to govern intensity when exercising independently       Able to check pulse independently Yes       Intervention Provide education and demonstration on how to check pulse in carotid and radial arteries.;Review the importance of being able to check your own pulse for safety during independent exercise       Expected Outcomes Short Term: Able to explain why pulse checking is important during independent exercise;Long Term: Able to check pulse independently and accurately       Understanding of Exercise Prescription Yes       Intervention Provide education, explanation, and written materials on patient's individual exercise prescription       Expected Outcomes Short Term: Able to explain program exercise prescription;Long Term: Able to explain home exercise prescription to exercise independently

## 2024-01-18 ENCOUNTER — Ambulatory Visit

## 2024-01-18 DIAGNOSIS — C50811 Malignant neoplasm of overlapping sites of right female breast: Secondary | ICD-10-CM

## 2024-01-18 NOTE — Progress Notes (Signed)
 Daily Session Note  Patient Details  Name: Sabrina Wilson MRN: [id number] Date of Birth: 05-21-1940 Referring Provider:   Conrad Ports Cancer Associated Rehabilitation & Exercise from 01/16/2024 in [facility] Cardiac and Pulmonary Rehab  Referring Provider Rennie Lighter, MD    Encounter Date: 01/18/2024  Check In:  Session Check In - 01/18/24 1329       Check-In   Supervising physician immediately available to respond to emergencies See telemetry face sheet for immediately available ER MD    Location [facility]-Cardiac & Pulmonary Rehab    Staff Present Rollene Paterson, MS, Exercise Physiologist;Maxon Conetta BS, Exercise Physiologist    Virtual Visit No    Medication changes reported     No    Fall or balance concerns reported    No    Warm-up and Cool-down Performed on first and last piece of equipment    Resistance Training Performed Yes    VAD Patient? No    PAD/SET Patient? No      Pain Assessment   Currently in Pain? No/denies    Multiple Pain Sites No             Social History   Tobacco Use  Smoking Status Former   Current packs/day: 0.00   Types: Cigarettes   Quit date: 10/12/2007   Years since quitting: 16.2  Smokeless Tobacco Never    Goals Met:  Independence with exercise equipment Exercise tolerated well No report of concerns or symptoms today  Goals Unmet:  Not Applicable  Comments: Pt able to follow exercise prescription today without complaint.  Will continue to monitor for progression.    Dr. Oneil Pinal is Medical Director for [facility] Cardiac Rehabilitation.  Dr. Fuad Aleskerov is Medical Director for [facility] Pulmonary Rehabilitation.

## 2024-01-20 ENCOUNTER — Other Ambulatory Visit: Payer: Self-pay | Admitting: Internal Medicine

## 2024-01-22 ENCOUNTER — Encounter: Payer: Self-pay | Admitting: Internal Medicine

## 2024-01-23 ENCOUNTER — Encounter

## 2024-01-23 ENCOUNTER — Other Ambulatory Visit: Payer: Self-pay | Admitting: *Deleted

## 2024-01-23 DIAGNOSIS — C50811 Malignant neoplasm of overlapping sites of right female breast: Secondary | ICD-10-CM

## 2024-01-23 NOTE — Progress Notes (Signed)
 Daily Session Note  Patient Details  Name: Sabrina Wilson MRN: [id number] Date of Birth: July 22, 1940 Referring Provider:   Conrad Ports Cancer Associated Rehabilitation & Exercise from 01/16/2024 in [facility] Cardiac and Pulmonary Rehab  Referring Provider Rennie Lighter, MD    Encounter Date: 01/23/2024  Check In:  Session Check In - 01/23/24 1232       Check-In   Supervising physician immediately available to respond to emergencies See telemetry face sheet for immediately available ER MD    Location [facility]-Cardiac & Pulmonary Rehab    Staff Present Rollene Paterson, MS, Exercise Physiologist;Elisavet Buehrer, BS, Exercise Physiologist    Virtual Visit No    Medication changes reported     No    Fall or balance concerns reported    No    Warm-up and Cool-down Performed on first and last piece of equipment    Resistance Training Performed Yes    VAD Patient? No    PAD/SET Patient? No      Pain Assessment   Currently in Pain? No/denies    Multiple Pain Sites No             Social History   Tobacco Use  Smoking Status Former   Current packs/day: 0.00   Types: Cigarettes   Quit date: 10/12/2007   Years since quitting: 16.2  Smokeless Tobacco Never    Goals Met:  Independence with exercise equipment Exercise tolerated well No report of concerns or symptoms today  Goals Unmet:  Not Applicable  Comments: Pt able to follow exercise prescription today without complaint.  Will continue to monitor for progression.    Dr. Oneil Pinal is Medical Director for [facility] Cardiac Rehabilitation.  Dr. Fuad Aleskerov is Medical Director for [facility] Pulmonary Rehabilitation.

## 2024-01-25 ENCOUNTER — Encounter: Attending: Internal Medicine

## 2024-01-25 ENCOUNTER — Other Ambulatory Visit: Payer: Self-pay

## 2024-01-25 DIAGNOSIS — C50811 Malignant neoplasm of overlapping sites of right female breast: Secondary | ICD-10-CM | POA: Insufficient documentation

## 2024-01-25 DIAGNOSIS — Z170 Estrogen receptor positive status [ER+]: Secondary | ICD-10-CM | POA: Insufficient documentation

## 2024-01-25 NOTE — Progress Notes (Signed)
 Daily Session Note  Patient Details  Name: Cherylene Ferrufino MRN: [id number] Date of Birth: Jul 31, 1940 Referring Provider:   Conrad Ports Cancer Associated Rehabilitation & Exercise from 01/16/2024 in [facility] Cardiac and Pulmonary Rehab  Referring Provider Rennie Lighter, MD    Encounter Date: 01/25/2024  Check In:  Session Check In - 01/25/24 1332       Check-In   Supervising physician immediately available to respond to emergencies See telemetry face sheet for immediately available ER MD    Location [facility]-Cardiac & Pulmonary Rehab    Staff Present Rollene Paterson, MS, Exercise Physiologist;Maxon Conetta BS, Exercise Physiologist    Virtual Visit No    Medication changes reported     No    Fall or balance concerns reported    No    Warm-up and Cool-down Performed on first and last piece of equipment    Resistance Training Performed Yes    VAD Patient? No    PAD/SET Patient? No      Pain Assessment   Currently in Pain? No/denies    Multiple Pain Sites No             Social History   Tobacco Use  Smoking Status Former   Current packs/day: 0.00   Types: Cigarettes   Quit date: 10/12/2007   Years since quitting: 16.2  Smokeless Tobacco Never    Goals Met:  Independence with exercise equipment Exercise tolerated well No report of concerns or symptoms today  Goals Unmet:  Not Applicable  Comments: Pt able to follow exercise prescription today without complaint.  Will continue to monitor for progression.    Dr. Oneil Pinal is Medical Director for [facility] Cardiac Rehabilitation.  Dr. Fuad Aleskerov is Medical Director for [facility] Pulmonary Rehabilitation.

## 2024-01-29 ENCOUNTER — Inpatient Hospital Stay: Attending: Internal Medicine

## 2024-01-29 ENCOUNTER — Inpatient Hospital Stay: Admitting: Pharmacist

## 2024-01-29 VITALS — BP 157/75 | HR 64 | Wt 175.7 lb

## 2024-01-29 DIAGNOSIS — Z170 Estrogen receptor positive status [ER+]: Secondary | ICD-10-CM | POA: Insufficient documentation

## 2024-01-29 DIAGNOSIS — Z923 Personal history of irradiation: Secondary | ICD-10-CM | POA: Insufficient documentation

## 2024-01-29 DIAGNOSIS — Z85038 Personal history of other malignant neoplasm of large intestine: Secondary | ICD-10-CM | POA: Insufficient documentation

## 2024-01-29 DIAGNOSIS — C50811 Malignant neoplasm of overlapping sites of right female breast: Secondary | ICD-10-CM | POA: Insufficient documentation

## 2024-01-29 DIAGNOSIS — Z9221 Personal history of antineoplastic chemotherapy: Secondary | ICD-10-CM | POA: Insufficient documentation

## 2024-01-29 DIAGNOSIS — Z1732 Human epidermal growth factor receptor 2 negative status: Secondary | ICD-10-CM | POA: Insufficient documentation

## 2024-01-29 DIAGNOSIS — Z1721 Progesterone receptor positive status: Secondary | ICD-10-CM | POA: Diagnosis not present

## 2024-01-29 DIAGNOSIS — Z79811 Long term (current) use of aromatase inhibitors: Secondary | ICD-10-CM | POA: Insufficient documentation

## 2024-01-29 DIAGNOSIS — Z79899 Other long term (current) drug therapy: Secondary | ICD-10-CM | POA: Diagnosis not present

## 2024-01-29 LAB — CBC WITH DIFFERENTIAL (CANCER CENTER ONLY)
Abs Immature Granulocytes: 0.04 K/uL (ref 0.00–0.07)
Basophils Absolute: 0 K/uL (ref 0.0–0.1)
Basophils Relative: 1 %
Eosinophils Absolute: 0.2 K/uL (ref 0.0–0.5)
Eosinophils Relative: 6 %
HCT: 30.9 % — ABNORMAL LOW (ref 36.0–46.0)
Hemoglobin: 10.4 g/dL — ABNORMAL LOW (ref 12.0–15.0)
Immature Granulocytes: 1 %
Lymphocytes Relative: 18 %
Lymphs Abs: 0.7 K/uL (ref 0.7–4.0)
MCH: 35.5 pg — ABNORMAL HIGH (ref 26.0–34.0)
MCHC: 33.7 g/dL (ref 30.0–36.0)
MCV: 105.5 fL — ABNORMAL HIGH (ref 80.0–100.0)
Monocytes Absolute: 0.3 K/uL (ref 0.1–1.0)
Monocytes Relative: 7 %
Neutro Abs: 2.6 K/uL (ref 1.7–7.7)
Neutrophils Relative %: 67 %
Platelet Count: 194 K/uL (ref 150–400)
RBC: 2.93 MIL/uL — ABNORMAL LOW (ref 3.87–5.11)
RDW: 13.3 % (ref 11.5–15.5)
Smear Review: NORMAL
WBC Count: 3.9 K/uL — ABNORMAL LOW (ref 4.0–10.5)
nRBC: 0 % (ref 0.0–0.2)

## 2024-01-29 LAB — CMP (CANCER CENTER ONLY)
ALT: 16 U/L (ref 0–44)
AST: 23 U/L (ref 15–41)
Albumin: 4 g/dL (ref 3.5–5.0)
Alkaline Phosphatase: 48 U/L (ref 38–126)
Anion gap: 8 (ref 5–15)
BUN: 22 mg/dL (ref 8–23)
CO2: 24 mmol/L (ref 22–32)
Calcium: 9.2 mg/dL (ref 8.9–10.3)
Chloride: 103 mmol/L (ref 98–111)
Creatinine: 1.25 mg/dL — ABNORMAL HIGH (ref 0.44–1.00)
GFR, Estimated: 43 mL/min — ABNORMAL LOW (ref >60)
Glucose, Bld: 116 mg/dL — ABNORMAL HIGH (ref 70–99)
Potassium: 4.3 mmol/L (ref 3.5–5.1)
Sodium: 135 mmol/L (ref 135–145)
Total Bilirubin: 0.9 mg/dL (ref 0.0–1.2)
Total Protein: 6.8 g/dL (ref 6.5–8.1)

## 2024-01-29 LAB — MAGNESIUM: Magnesium: 2 mg/dL (ref 1.7–2.4)

## 2024-01-29 NOTE — Progress Notes (Signed)
 Clinical Pharmacist Practitioner Clinic [facility]  Telephone:([phone] [phone]  Patient Care Team: Bair, Kalpana, MD as PCP - General (Family Medicine) [name], MD as PCP - Cardiology (Cardiology) Dessa, [name], MD as Consulting Physician (General Surgery) [name], MD (Internal Medicine) [name], RN as Oncology Nurse Navigator [name], MD as Consulting Physician (Radiation Oncology) [name], MD as Consulting Physician (Oncology) [name], DO as Consulting Physician (General Surgery)   Name of the patient: Sabrina Wilson  [id number]  1940-12-06   Date of visit: 01/29/24  HPI: Patient is a 83 y.o. female with early stage HR+/HER2- breast cancer. Patient switching from abemaciclib  (started 09/2022) due to intolerance to ribociclib . She continues to take anastrozole . She started her ribociclib  on 01/15/24.   Reason for Consult: Oral chemotherapy follow-up for ribociclib  therapy.   PAST MEDICAL HISTORY: Past Medical History:  Diagnosis Date   Actinic keratosis 02/12/2020   R lat calf (hypertrophic)   Arthritis    Cancer of sigmoid (HCC) 06/17/2016   Colon cancer (HCC) 2009   T3,N0; s/p resection  Dr. Primus and chemotherapy by Dr. Brooks   Diastolic dysfunction    a. 08/2020 Echo: EF 60-65%, no rwma, Gr1 DD, nl RV size/fxn. Mild MR.   DVT (deep venous thrombosis) (HCC) 05/29/2022   Hernia of flank    History of actinic keratoses 08/11/2020   Bx proven at left lateral ankle proximal, LN2 10/08/20   History of stress test    a. 09/2020 MV: EF >65%. No ischemia/infarct. Mild Ao Ca2+ and minimal Cor Ca2+.   Hypertension    Hypomagnesemia 05/29/2022   Hyponatremia 05/29/2022   Neuropathy    Personal history of radiation therapy 2023   right breast CA   Polyp at cervical os    s/p resection Dr. Cleotilde   Pulmonary embolism (HCC) 05/27/2022   Skin cancer 01/22/2020   surface of an atypical  verrucous squamous proliferation    Squamous cell carcinoma of skin 07/16/2020   Left lateral ankle anterior, treated with [facility] 08-11-2020    HEMATOLOGY/ONCOLOGY HISTORY:  Oncology History Overview Note  # 2009- Sigmoid colon cancer stage II (T3 N0 4 lymph nodes sampled M0 stage II high risk there was obstruction and perforation abscess; Dr.Hern), workup included a low CEA preoperatively and a chest x-ray and CT scan abdomen pelvis negative for metastatic disease, S/P colostomy; s/p FOLFOX x 6 months. [Dr.Gittin ]  # LATER REVERSED , K RAS  wild type B RAF not evaluated   Also initial iron deficiency with anemia thrombocytosis, high platelets considered reactive, workup includes a normal PCR for CML and a negative [id number] mutation  DIAGNOSIS:  A. BREAST, RIGHT; LUMPECTOMY:  - INVASIVE MAMMARY CARCINOMA.  - DUCTAL CARCINOMA IN SITU (DCIS).  - SEE CANCER SUMMARY BELOW.  - BIOPSY SITE CHANGE WITH CLIP (2).  - FIBROUS TISSUE WITH CYST FORMATION AND FLORID DUCTAL HYPERPLASIA.  - SKIN AND NIPPLE NEGATIVE FOR MALIGNANCY.  - VASCULAR CALCIFICATION.   B. SENTINEL LYMPH NODES 1 AND 2, RIGHT AXILLA; EXCISION:  - METASTATIC CARCINOMA INVOLVES ONE OF TWO LYMPH NODES (1/2).   C. NON-SENTINEL LYMPH NODES, RIGHT AXILLA; EXCISION:  - ONE LYMPH NODE NEGATIVE FOR MALIGNANCY (0/1).   CANCER CASE SUMMARY: INVASIVE CARCINOMA OF THE BREAST  Standard(s): AJCC-UICC 8   SPECIMEN  Procedure: Lumpectomy  Specimen Laterality: Right   TUMOR  Histologic Type: Invasive carcinoma of no special type (ductal)  Histologic Grade (Nottingham Histologic Score)  Glandular (Acinar)/Tubular Differentiation: 3       Nuclear Pleomorphism: 3       Mitotic Rate: 3       Overall Grade: 3  Tumor Size: 24 mm  Tumor Focality: Single focus of invasive carcinoma  Ductal Carcinoma In Situ (DCIS): Present, nuclear grade 2-3  Tumor Extent: Not applicable  Lymphatic and/or Vascular Invasion: Present  Treatment Effect in  the Breast: No known presurgical therapy   MARGINS  Margin Status for Invasive Carcinoma: All margins negative for invasive  carcinoma       Distance from closest margin: 10 mm       Specify closest margin: Superior   Margin Status for DCIS: All margins negative for DCIS       Distance from DCIS to closest margin: 10 mm       Specify closest margin: Superior   REGIONAL LYMPH NODES  Regional Lymph Node Status: Tumor present in regional lymph node(s)       Number of Lymph Nodes with Macrometastases (greater than 2 mm): 1       Number of Lymph Nodes with Micrometastases (greater than 0.2 mm to  2 mm and/or greater than 200 cells): 0       Number of Lymph Nodes with Isolated Tumor Cells (0.2 mm or less OR  200 cells or less): 0       Size of Largest Metastatic Deposit: 3 mm      Extranodal Extension: Not identified       Total Number of Lymph Nodes Examined (sentinel and non-sentinel): 3       Number of Sentinel Nodes Examined: 2   DISTANT METASTASIS  Distant Site(s) Involved, if applicable: Not applicable   PATHOLOGIC STAGE CLASSIFICATION (pTNM, AJCC 8th Edition):  Modified Classification: Not applicable  pT Category: pT2  T Suffix: Not applicable  pN Category: pN1a  N Suffix: (sn)  pM Category: Not applicable   SPECIAL STUDIES  Breast Biomarker Testing Performed on Previous Outside Biopsy:  [id number]  Per outside report:  Estrogen Receptor (ER) Status: POSITIVE          Percentage of cells with nuclear positivity: Greater than 90%          Average intensity of staining: Strong   Progesterone Receptor (PgR) Status: POSITIVE          Percentage of cells with nuclear positivity: 60%          Average intensity of staining: Strong   HER2 (by immunohistochemistry): EQUIVOCAL (Score 2+)  HER2 FISH: NEGATIVE   Ki-67: Not performed   # Cytoxan  Taxotere  x 4 cycles; finished JAN, 2024. [Neutropenic fevers s/p cycle #4;   -FEB 2nd, 2024-right lung PE; left lower LE  DVT- on  eliquis .   # FEB 19th-start RT; mpectomy radiation April 3rd 2024.  # anastrozole  once a day [April 2024]-t # Poor tolerance to adjuvant abemaciclib  100 mg BID [started second week of June] stopped x 1 month.    # JAN 8th 2024- start abemaciclib  50 mg BID- STOPPED in AUG 2025- sec to intolerance.   # LATE SEP 2025- start RIBOCICLIB  400 mg 3 w- on and 1 week OFF      Cancer of sigmoid (HCC) (Resolved)  Carcinoma of overlapping sites of right breast in female, estrogen receptor positive (HCC)  02/14/2022 Initial Diagnosis   Carcinoma of overlapping sites of right breast in female, estrogen receptor positive (HCC)   02/14/2022 Cancer Staging   Staging form: Breast, AJCC  8th Edition - Pathologic: Stage IIA (pT2, pN1, cM0, G3, ER+, PR+, HER2-) - Signed by [name], MD on 02/14/2022 Histologic grading system: 3 grade system   02/23/2022 -  Chemotherapy   Patient is on Treatment Plan : BREAST TC q21d       ALLERGIES:  is allergic to sulfa antibiotics.  MEDICATIONS:  Current Outpatient Medications  Medication Sig Dispense Refill   anastrozole  (ARIMIDEX ) 1 MG tablet TAKE 1 TABLET BY MOUTH DAILY 90 tablet 0   atorvastatin  (LIPITOR) 10 MG tablet Take 1 tablet (10 mg total) by mouth daily. 90 tablet 3   budesonide-glycopyrrolate -formoterol (BREZTRI  AEROSPHERE) 160-9-4.8 MCG/ACT AERO inhaler Inhale 2 puffs into the lungs in the morning and at bedtime. 1 each 11   furosemide  (LASIX ) 20 MG tablet TAKE 1 TABLET BY MOUTH EVERY OTHER DAY 30 tablet 1   halobetasol  (ULTRAVATE ) 0.05 % cream Apply topically to aa's of rash at body twice daily on weekends only as needed. Avoid applying to face, groin, and axilla. Use as directed. 50 g 1   KLOR-CON  M20 20 MEQ tablet TAKE 1 TABLET BY MOUTH EVERY OTHER DAY 30 tablet 0   loratadine (CLARITIN) 10 MG tablet Take 10 mg by mouth daily.     niacinamide 500 MG tablet Take 500 mg by mouth 2 (two) times daily.     Omega-3 Fatty Acids (FISH OIL PO)  Take 1 capsule by mouth daily.     ribociclib  succ (KISQALI  400MG  DAILY DOSE) 200 MG Therapy Pack Take 2 tablets (400 mg total) by mouth daily. Take for 21 days on, 7 days off, repeat every 28 days. 42 tablet 1   Spacer/Aero-Holding Chambers (AEROCHAMBER MV) inhaler Use as instructed 1 each 0   tacrolimus  (PROTOPIC ) 0.1 % ointment Apply topically to affected areas of rash twice daily on week days only prn 60 g 1   No current facility-administered medications for this visit.    VITAL SIGNS: BP (!) 157/75   Pulse 64   Wt 79.7 kg (175 lb 11.2 oz)   BMI 27.60 kg/m  Filed Weights   01/29/24 1441  Weight: 79.7 kg (175 lb 11.2 oz)    Estimated body mass index is 27.6 kg/m as calculated from the following:   Height as of 01/16/24: 5' 6.9 (1.699 m).   Weight as of this encounter: 79.7 kg (175 lb 11.2 oz).  LABS: CBC:    Component Value Date/Time   WBC 3.9 (L) 01/29/2024 1428   WBC 5.8 07/14/2022 1505   HGB 10.4 (L) 01/29/2024 1428   HGB 12.4 05/15/2013 1445   HCT 30.9 (L) 01/29/2024 1428   HCT 38.2 05/15/2013 1445   PLT 194 01/29/2024 1428   PLT 255 05/15/2013 1445   MCV 105.5 (H) 01/29/2024 1428   MCV 96 05/15/2013 1445   NEUTROABS 2.6 01/29/2024 1428   NEUTROABS 3.9 05/15/2013 1445   LYMPHSABS 0.7 01/29/2024 1428   LYMPHSABS 2.0 05/15/2013 1445   MONOABS 0.3 01/29/2024 1428   MONOABS 0.6 05/15/2013 1445   EOSABS 0.2 01/29/2024 1428   EOSABS 0.4 05/15/2013 1445   BASOSABS 0.0 01/29/2024 1428   BASOSABS 0.1 05/15/2013 1445   Comprehensive Metabolic Panel:    Component Value Date/Time   NA 135 01/29/2024 1429   NA 140 10/03/2023 1544   NA 133 (L) 07/15/2012 2105   K 4.3 01/29/2024 1429   K 3.8 07/18/2012 0503   CL 103 01/29/2024 1429   CL 100 07/15/2012 2105   CO2 24  01/29/2024 1429   CO2 28 07/15/2012 2105   BUN 22 01/29/2024 1429   BUN 27 10/03/2023 1544   BUN 10 07/15/2012 2105   CREATININE 1.25 (H) 01/29/2024 1429   CREATININE 0.68 07/15/2012 2105   GLUCOSE  116 (H) 01/29/2024 1429   GLUCOSE 136 (H) 07/15/2012 2105   CALCIUM  9.2 01/29/2024 1429   CALCIUM  8.4 (L) 07/15/2012 2105   AST 23 01/29/2024 1429   ALT 16 01/29/2024 1429   ALT 24 06/22/2011 1323   ALKPHOS 48 01/29/2024 1429   ALKPHOS 72 06/22/2011 1323   BILITOT 0.9 01/29/2024 1429   PROT 6.8 01/29/2024 1429   PROT 7.5 06/22/2011 1323   ALBUMIN 4.0 01/29/2024 1429   ALBUMIN 3.8 06/22/2011 1323     Present during today's visit: patient only  Assessment and Plan: CMP/CBC/ECGs reviewed, continue ribociclib  400mg  21 on/7off ECG showed QTcF (Fridericia) of and Of note: [facility] ECGs report out QTcB and this has to be converted to QTcF which is what ribociclib  uses for dose modification considerations Last scheduled ECG will be on 10/20, if no issues at that time no further check is needed unless clinical indicated  Overall patient is tolerating the ribociclib  well (especially compared her her previous abemaciclib )   Oral Chemotherapy Side Effect/Intolerance:  No reported fatigue, rash, or nausea Diarrhea: little diarrhea that resolved without antidiarrheal   Oral Chemotherapy Adherence: No missed doses reported Ms. Moeckel patient barriers to medication adherence identified.   New medications: None reported  Medication Access Issues: no isses, patient fills at [facility] (Specialty)  Patient expressed understanding and was in agreement with this plan. She also understands that She can call clinic at any time with any questions, concerns, or complaints.   Follow-up plan: RTC in 2 weeks (already scheduled) labs/ECG/NP  Thank you for allowing me to participate in the care of this very pleasant patient.   Time Total: 15 mins  Visit consisted of counseling and education on dealing with issues of symptom management in the setting of serious and potentially life-threatening illness.Greater than 50%  of this time was spent counseling and coordinating care related to  the above assessment and plan.  Signed by: Kinzleigh Kandler N. Carli Lefevers, PharmD, NEILA, CPP Hematology/Oncology Clinical Pharmacist Practitioner [place] [phone]  01/29/2024 4:11 PM

## 2024-01-30 ENCOUNTER — Encounter

## 2024-01-30 DIAGNOSIS — C50811 Malignant neoplasm of overlapping sites of right female breast: Secondary | ICD-10-CM

## 2024-01-30 NOTE — Progress Notes (Signed)
 Daily Session Note  Patient Details  Name: Sabrina Wilson MRN: [id number] Date of Birth: Sep 19, 1940 Referring Provider:   Conrad Ports Cancer Associated Rehabilitation & Exercise from 01/16/2024 in [facility] Cardiac and Pulmonary Rehab  Referring Provider Rennie Lighter, MD    Encounter Date: 01/30/2024  Check In:  Session Check In - 01/30/24 1240       Check-In   Supervising physician immediately available to respond to emergencies See telemetry face sheet for immediately available ER MD    Location [facility]-Cardiac & Pulmonary Rehab    Staff Present Rollene Paterson, MS, Exercise Physiologist;Corene Resnick, BS, Exercise Physiologist    Virtual Visit No    Medication changes reported     No    Fall or balance concerns reported    No    Warm-up and Cool-down Performed on first and last piece of equipment    Resistance Training Performed Yes    VAD Patient? No    PAD/SET Patient? No      Pain Assessment   Currently in Pain? No/denies    Multiple Pain Sites No             Social History   Tobacco Use  Smoking Status Former   Current packs/day: 0.00   Types: Cigarettes   Quit date: 10/12/2007   Years since quitting: 16.3  Smokeless Tobacco Never    Goals Met:  Independence with exercise equipment Exercise tolerated well No report of concerns or symptoms today  Goals Unmet:  Not Applicable  Comments: Pt able to follow exercise prescription today without complaint.  Will continue to monitor for progression.    Dr. Oneil Pinal is Medical Director for [facility] Cardiac Rehabilitation.  Dr. Fuad Aleskerov is Medical Director for [facility] Pulmonary Rehabilitation.

## 2024-01-31 ENCOUNTER — Other Ambulatory Visit: Payer: Self-pay

## 2024-02-01 ENCOUNTER — Encounter

## 2024-02-01 ENCOUNTER — Other Ambulatory Visit: Payer: Self-pay | Admitting: Internal Medicine

## 2024-02-01 DIAGNOSIS — C50811 Malignant neoplasm of overlapping sites of right female breast: Secondary | ICD-10-CM

## 2024-02-01 NOTE — Progress Notes (Signed)
 Daily Session Note  Patient Details  Name: Sabrina Wilson MRN: [id number] Date of Birth: Nov 04, 1940 Referring Provider:   Conrad Ports Cancer Associated Rehabilitation & Exercise from 01/16/2024 in [facility] Cardiac and Pulmonary Rehab  Referring Provider Rennie Lighter, MD    Encounter Date: 02/01/2024  Check In:  Session Check In - 02/01/24 1238       Check-In   Supervising physician immediately available to respond to emergencies See telemetry face sheet for immediately available ER MD    Location [facility]-Cardiac & Pulmonary Rehab    Staff Present Rollene Paterson, MS, Exercise Physiologist;Maxon Conetta BS, Exercise Physiologist    Virtual Visit No    Medication changes reported     No    Fall or balance concerns reported    No    Warm-up and Cool-down Performed on first and last piece of equipment    Resistance Training Performed Yes    VAD Patient? No    PAD/SET Patient? No      Pain Assessment   Currently in Pain? No/denies    Multiple Pain Sites No             Social History   Tobacco Use  Smoking Status Former   Current packs/day: 0.00   Types: Cigarettes   Quit date: 10/12/2007   Years since quitting: 16.3  Smokeless Tobacco Never    Goals Met:  Independence with exercise equipment Exercise tolerated well No report of concerns or symptoms today  Goals Unmet:  Not Applicable  Comments: Pt able to follow exercise prescription today without complaint.  Will continue to monitor for progression.    Dr. Oneil Pinal is Medical Director for [facility] Cardiac Rehabilitation.  Dr. Fuad Aleskerov is Medical Director for [facility] Pulmonary Rehabilitation.

## 2024-02-02 ENCOUNTER — Other Ambulatory Visit: Payer: Self-pay | Admitting: Pharmacy Technician

## 2024-02-02 ENCOUNTER — Other Ambulatory Visit: Payer: Self-pay

## 2024-02-02 NOTE — Progress Notes (Signed)
 Specialty Pharmacy Refill Coordination Note  Melisia Leming is a 83 y.o. female contacted today regarding refills of specialty medication(s) Ribociclib  Succinate (KISQALI  400mg  Daily Dose)   Patient requested Delivery   Delivery date: 02/07/24   Verified address: [address]  [place]   Medication will be filled on 02/06/24.

## 2024-02-05 ENCOUNTER — Other Ambulatory Visit: Payer: Self-pay

## 2024-02-05 NOTE — Progress Notes (Signed)
 Specialty Pharmacy Ongoing Clinical Assessment Note  Sabrina Wilson is a 83 y.o. female who is being followed by the specialty pharmacy service for RxSp Oncology   Patient's specialty medication(s) reviewed today: Abemaciclib  (VERZENIO )   Missed doses in the last 4 weeks: 0   Patient/Caregiver did not have any additional questions or concerns.   Therapeutic benefit summary: Unable to assess   Adverse events/side effects summary: No adverse events/side effects   Patient's therapy is appropriate to: Continue    Goals Addressed             This Visit's Progress    Achieve a cure       Patient is unable to be assessed as therapy was recently initiated. Patient will maintain adherence         Follow up: 3 months  [facility] Specialty Pharmacist

## 2024-02-06 ENCOUNTER — Encounter

## 2024-02-06 ENCOUNTER — Other Ambulatory Visit (HOSPITAL_COMMUNITY): Payer: Self-pay

## 2024-02-06 DIAGNOSIS — C50811 Malignant neoplasm of overlapping sites of right female breast: Secondary | ICD-10-CM

## 2024-02-06 NOTE — Progress Notes (Signed)
 Daily Session Note  Patient Details  Name: Sabrina Wilson MRN: [id number] Date of Birth: April 07, 1941 Referring Provider:   Conrad Ports Cancer Associated Rehabilitation & Exercise from 01/16/2024 in [facility] Cardiac and Pulmonary Rehab  Referring Provider Rennie Lighter, MD    Encounter Date: 02/06/2024  Check In:  Session Check In - 02/06/24 1243       Check-In   Supervising physician immediately available to respond to emergencies See telemetry face sheet for immediately available ER MD    Location [facility]-Cardiac & Pulmonary Rehab    Staff Present Rollene Paterson, MS, Exercise Physiologist;Paxtyn Boyar, BS, Exercise Physiologist    Virtual Visit No    Medication changes reported     No    Fall or balance concerns reported    No    Warm-up and Cool-down Performed on first and last piece of equipment    Resistance Training Performed Yes    VAD Patient? No    PAD/SET Patient? No      Pain Assessment   Currently in Pain? No/denies    Multiple Pain Sites No             Social History   Tobacco Use  Smoking Status Former   Current packs/day: 0.00   Types: Cigarettes   Quit date: 10/12/2007   Years since quitting: 16.3  Smokeless Tobacco Never    Goals Met:  Independence with exercise equipment Exercise tolerated well No report of concerns or symptoms today  Goals Unmet:  Not Applicable  Comments: Pt able to follow exercise prescription today without complaint.  Will continue to monitor for progression.    Dr. Oneil Pinal is Medical Director for [facility] Cardiac Rehabilitation.  Dr. Fuad Aleskerov is Medical Director for [facility] Pulmonary Rehabilitation.

## 2024-02-08 ENCOUNTER — Encounter

## 2024-02-08 DIAGNOSIS — Z170 Estrogen receptor positive status [ER+]: Secondary | ICD-10-CM

## 2024-02-08 NOTE — Progress Notes (Signed)
 Daily Session Note  Patient Details  Name: Sabrina Wilson MRN: [id number] Date of Birth: 03/24/1941 Referring Provider:   Conrad Ports Cancer Associated Rehabilitation & Exercise from 01/16/2024 in [facility] Cardiac and Pulmonary Rehab  Referring Provider Rennie Lighter, MD    Encounter Date: 02/08/2024  Check In:  Session Check In - 02/08/24 1251       Check-In   Supervising physician immediately available to respond to emergencies See telemetry face sheet for immediately available ER MD    Location [facility]-Cardiac & Pulmonary Rehab    Staff Present Rollene Paterson, MS, Exercise Physiologist;Maxon Conetta BS, Exercise Physiologist    Virtual Visit No    Medication changes reported     No    Fall or balance concerns reported    No    Warm-up and Cool-down Performed on first and last piece of equipment    Resistance Training Performed Yes    VAD Patient? No    PAD/SET Patient? No      Pain Assessment   Currently in Pain? No/denies    Multiple Pain Sites No             Social History   Tobacco Use  Smoking Status Former   Current packs/day: 0.00   Types: Cigarettes   Quit date: 10/12/2007   Years since quitting: 16.3  Smokeless Tobacco Never    Goals Met:  Independence with exercise equipment Exercise tolerated well No report of concerns or symptoms today  Goals Unmet:  Not Applicable  Comments: Pt able to follow exercise prescription today without complaint.  Will continue to monitor for progression.    Dr. Oneil Pinal is Medical Director for [facility] Cardiac Rehabilitation.  Dr. Fuad Aleskerov is Medical Director for [facility] Pulmonary Rehabilitation.

## 2024-02-09 ENCOUNTER — Other Ambulatory Visit: Payer: Self-pay | Admitting: *Deleted

## 2024-02-09 DIAGNOSIS — C50811 Malignant neoplasm of overlapping sites of right female breast: Secondary | ICD-10-CM

## 2024-02-12 ENCOUNTER — Encounter: Payer: Self-pay | Admitting: Nurse Practitioner

## 2024-02-12 ENCOUNTER — Inpatient Hospital Stay (HOSPITAL_BASED_OUTPATIENT_CLINIC_OR_DEPARTMENT_OTHER): Admitting: Nurse Practitioner

## 2024-02-12 ENCOUNTER — Inpatient Hospital Stay

## 2024-02-12 VITALS — BP 151/72 | HR 77 | Temp 98.6°F | Resp 20 | Wt 175.6 lb

## 2024-02-12 DIAGNOSIS — Z5181 Encounter for therapeutic drug level monitoring: Secondary | ICD-10-CM | POA: Diagnosis not present

## 2024-02-12 DIAGNOSIS — Z79899 Other long term (current) drug therapy: Secondary | ICD-10-CM

## 2024-02-12 DIAGNOSIS — Z170 Estrogen receptor positive status [ER+]: Secondary | ICD-10-CM | POA: Diagnosis not present

## 2024-02-12 DIAGNOSIS — C50811 Malignant neoplasm of overlapping sites of right female breast: Secondary | ICD-10-CM

## 2024-02-12 LAB — CBC WITH DIFFERENTIAL/PLATELET
Abs Immature Granulocytes: 0.03 K/uL (ref 0.00–0.07)
Basophils Absolute: 0.1 K/uL (ref 0.0–0.1)
Basophils Relative: 1 %
Eosinophils Absolute: 0.2 K/uL (ref 0.0–0.5)
Eosinophils Relative: 6 %
HCT: 31.9 % — ABNORMAL LOW (ref 36.0–46.0)
Hemoglobin: 10.7 g/dL — ABNORMAL LOW (ref 12.0–15.0)
Immature Granulocytes: 1 %
Lymphocytes Relative: 30 %
Lymphs Abs: 1.1 K/uL (ref 0.7–4.0)
MCH: 36.1 pg — ABNORMAL HIGH (ref 26.0–34.0)
MCHC: 33.5 g/dL (ref 30.0–36.0)
MCV: 107.8 fL — ABNORMAL HIGH (ref 80.0–100.0)
Monocytes Absolute: 0.5 K/uL (ref 0.1–1.0)
Monocytes Relative: 14 %
Neutro Abs: 1.7 K/uL (ref 1.7–7.7)
Neutrophils Relative %: 48 %
Platelets: 149 K/uL — ABNORMAL LOW (ref 150–400)
RBC: 2.96 MIL/uL — ABNORMAL LOW (ref 3.87–5.11)
RDW: 14.9 % (ref 11.5–15.5)
WBC: 3.6 K/uL — ABNORMAL LOW (ref 4.0–10.5)
nRBC: 0 % (ref 0.0–0.2)

## 2024-02-12 LAB — CMP (CANCER CENTER ONLY)
ALT: 16 U/L (ref 0–44)
AST: 27 U/L (ref 15–41)
Albumin: 4 g/dL (ref 3.5–5.0)
Alkaline Phosphatase: 40 U/L (ref 38–126)
Anion gap: 11 (ref 5–15)
BUN: 23 mg/dL (ref 8–23)
CO2: 22 mmol/L (ref 22–32)
Calcium: 9.5 mg/dL (ref 8.9–10.3)
Chloride: 102 mmol/L (ref 98–111)
Creatinine: 1.19 mg/dL — ABNORMAL HIGH (ref 0.44–1.00)
GFR, Estimated: 46 mL/min — ABNORMAL LOW (ref >60)
Glucose, Bld: 100 mg/dL — ABNORMAL HIGH (ref 70–99)
Potassium: 4.1 mmol/L (ref 3.5–5.1)
Sodium: 135 mmol/L (ref 135–145)
Total Bilirubin: 0.8 mg/dL (ref 0.0–1.2)
Total Protein: 6.8 g/dL (ref 6.5–8.1)

## 2024-02-12 LAB — MAGNESIUM: Magnesium: 2 mg/dL (ref 1.7–2.4)

## 2024-02-12 NOTE — Progress Notes (Signed)
 Obtained EKG in clinic today.

## 2024-02-12 NOTE — Progress Notes (Signed)
 [facility] CONSULT NOTE  Patient Care Team: Bair, Kalpana, MD as PCP - General (Family Medicine) [name], MD as PCP - Cardiology (Cardiology) Dessa, [name], MD as Consulting Physician (General Surgery) [name], MD (Internal Medicine) [name], RN as Oncology Nurse Navigator [name], MD as Consulting Physician (Radiation Oncology) [name], MD as Consulting Physician (Oncology) [name], DO as Consulting Physician (General Surgery)  CHIEF COMPLAINTS/PURPOSE OF CONSULTATION: Breast cancer  #  Oncology History Overview Note  # 2009- Sigmoid colon cancer stage II (T3 N0 4 lymph nodes sampled M0 stage II high risk there was obstruction and perforation abscess; Dr.Hern), workup included a low CEA preoperatively and a chest x-ray and CT scan abdomen pelvis negative for metastatic disease, S/P colostomy; s/p FOLFOX x 6 months. [Dr.Gittin ]  # LATER REVERSED , K RAS  wild type B RAF not evaluated   Also initial iron deficiency with anemia thrombocytosis, high platelets considered reactive, workup includes a normal PCR for CML and a negative [id number] mutation  DIAGNOSIS:  A. BREAST, RIGHT; LUMPECTOMY:  - INVASIVE MAMMARY CARCINOMA.  - DUCTAL CARCINOMA IN SITU (DCIS).  - SEE CANCER SUMMARY BELOW.  - BIOPSY SITE CHANGE WITH CLIP (2).  - FIBROUS TISSUE WITH CYST FORMATION AND FLORID DUCTAL HYPERPLASIA.  - SKIN AND NIPPLE NEGATIVE FOR MALIGNANCY.  - VASCULAR CALCIFICATION.   B. SENTINEL LYMPH NODES 1 AND 2, RIGHT AXILLA; EXCISION:  - METASTATIC CARCINOMA INVOLVES ONE OF TWO LYMPH NODES (1/2).   C. NON-SENTINEL LYMPH NODES, RIGHT AXILLA; EXCISION:  - ONE LYMPH NODE NEGATIVE FOR MALIGNANCY (0/1).   CANCER CASE SUMMARY: INVASIVE CARCINOMA OF THE BREAST  Standard(s): AJCC-UICC 8   SPECIMEN  Procedure: Lumpectomy  Specimen Laterality: Right   TUMOR  Histologic Type: Invasive carcinoma of no special type (ductal)   Histologic Grade (Nottingham Histologic Score)       Glandular (Acinar)/Tubular Differentiation: 3       Nuclear Pleomorphism: 3       Mitotic Rate: 3       Overall Grade: 3  Tumor Size: 24 mm  Tumor Focality: Single focus of invasive carcinoma  Ductal Carcinoma In Situ (DCIS): Present, nuclear grade 2-3  Tumor Extent: Not applicable  Lymphatic and/or Vascular Invasion: Present  Treatment Effect in the Breast: No known presurgical therapy   MARGINS  Margin Status for Invasive Carcinoma: All margins negative for invasive  carcinoma       Distance from closest margin: 10 mm       Specify closest margin: Superior   Margin Status for DCIS: All margins negative for DCIS       Distance from DCIS to closest margin: 10 mm       Specify closest margin: Superior   REGIONAL LYMPH NODES  Regional Lymph Node Status: Tumor present in regional lymph node(s)       Number of Lymph Nodes with Macrometastases (greater than 2 mm): 1       Number of Lymph Nodes with Micrometastases (greater than 0.2 mm to  2 mm and/or greater than 200 cells): 0       Number of Lymph Nodes with Isolated Tumor Cells (0.2 mm or less OR  200 cells or less): 0       Size of Largest Metastatic Deposit: 3 mm      Extranodal Extension: Not identified       Total Number of Lymph Nodes Examined (sentinel and non-sentinel): 3  Number of Sentinel Nodes Examined: 2   DISTANT METASTASIS  Distant Site(s) Involved, if applicable: Not applicable   PATHOLOGIC STAGE CLASSIFICATION (pTNM, AJCC 8th Edition):  Modified Classification: Not applicable  pT Category: pT2  T Suffix: Not applicable  pN Category: pN1a  N Suffix: (sn)  pM Category: Not applicable   SPECIAL STUDIES  Breast Biomarker Testing Performed on Previous Outside Biopsy:  23-250-T11  Per outside report:  Estrogen Receptor (ER) Status: POSITIVE          Percentage of cells with nuclear positivity: Greater than 90%          Average intensity of staining:  Strong   Progesterone Receptor (PgR) Status: POSITIVE          Percentage of cells with nuclear positivity: 60%          Average intensity of staining: Strong   HER2 (by immunohistochemistry): EQUIVOCAL (Score 2+)  HER2 FISH: NEGATIVE   Ki-67: Not performed   # Cytoxan  Taxotere  x 4 cycles; finished JAN, 2024. [Neutropenic fevers s/p cycle #4;   -FEB 2nd, 2024-right lung PE; left lower LE  DVT- on eliquis .   # FEB 19th-start RT; mpectomy radiation April 3rd 2024.  # anastrozole  once a day [April 2024]-t # Poor tolerance to adjuvant abemaciclib  100 mg BID [started second week of June] stopped x 1 month.    # JAN 8th 2024- start abemaciclib  50 mg BID- STOPPED in AUG 2025- sec to intolerance.   # LATE SEP 2025- start RIBOCICLIB  400 mg 3 w- on and 1 week OFF      Cancer of sigmoid (HCC) (Resolved)  Carcinoma of overlapping sites of right breast in female, estrogen receptor positive (HCC)  02/14/2022 Initial Diagnosis   Carcinoma of overlapping sites of right breast in female, estrogen receptor positive (HCC)   02/14/2022 Cancer Staging   Staging form: Breast, AJCC 8th Edition - Pathologic: Stage IIA (pT2, pN1, cM0, G3, ER+, PR+, HER2-) - Signed by [name], MD on 02/14/2022 Histologic grading system: 3 grade system   02/23/2022 -  Chemotherapy   Patient is on Treatment Plan : BREAST TC q21d      HISTORY OF PRESENTING ILLNESS:   Alone.  Ambulating independently.   Sabrina Wilson 83 y.o. female patient with ER/PR positive HER2/neu negative breast cancer, stage II, T2 N1, currently receiving adjuvant anastrozole  and Verzenio  who returns to clinic follow-up.  She restarted Verzenio  in January 2025.  Stopped in August 2025 due to weakness and BLE. Now on ribociclib -anastrozole . Fatigue has improved. Ongoing neuropathy but not impairing activity. She has mild diarrhea but intermittent and does not require medication.   Review of Systems  Constitutional:  Negative for  chills, diaphoresis, fever and malaise/fatigue.  HENT:  Negative for nosebleeds and sore throat.   Eyes:  Negative for double vision.  Respiratory:  Negative for hemoptysis, sputum production and wheezing.   Cardiovascular:  Negative for chest pain, palpitations, orthopnea and leg swelling.  Gastrointestinal:  Positive for diarrhea. Negative for abdominal pain, blood in stool, constipation, heartburn, melena, nausea and vomiting.  Genitourinary:  Negative for dysuria, frequency and urgency.  Musculoskeletal:  Positive for back pain and joint pain.  Skin:  Negative for itching and rash.  Neurological:  Positive for tingling and sensory change. Negative for dizziness, focal weakness, weakness and headaches.  Endo/Heme/Allergies:  Does not bruise/bleed easily.  Psychiatric/Behavioral:  Negative for depression. The patient is not nervous/anxious and does not have insomnia.  MEDICAL HISTORY:  Past Medical History:  Diagnosis Date   Actinic keratosis 02/12/2020   R lat calf (hypertrophic)   Arthritis    Cancer of sigmoid (HCC) 06/17/2016   Colon cancer (HCC) 2009   T3,N0; s/p resection  Dr. Primus and chemotherapy by Dr. Brooks   Diastolic dysfunction    a. 08/2020 Echo: EF 60-65%, no rwma, Gr1 DD, nl RV size/fxn. Mild MR.   DVT (deep venous thrombosis) (HCC) 05/29/2022   Hernia of flank    History of actinic keratoses 08/11/2020   Bx proven at left lateral ankle proximal, LN2 10/08/20   History of stress test    a. 09/2020 MV: EF >65%. No ischemia/infarct. Mild Ao Ca2+ and minimal Cor Ca2+.   Hypertension    Hypomagnesemia 05/29/2022   Hyponatremia 05/29/2022   Neuropathy    Personal history of radiation therapy 2023   right breast CA   Polyp at cervical os    s/p resection Dr. Cleotilde   Pulmonary embolism (HCC) 05/27/2022   Skin cancer 01/22/2020   surface of an atypical verrucous squamous proliferation    Squamous cell carcinoma of skin 07/16/2020   Left lateral ankle anterior,  treated with [facility] 08-11-2020    SURGICAL HISTORY: Past Surgical History:  Procedure Laterality Date   BREAST BIOPSY Right 05/2014   Dr. Fredirick office-benign   BREAST LUMPECTOMY WITH SENTINEL LYMPH NODE BIOPSY Right 01/21/2022   Procedure: BREAST LUMPECTOMY WITH SENTINEL LYMPH NODE BX;  Surgeon: Dessa [name], MD;  Location: [facility] ORS;  Service: General;  Laterality: Right;   BREAST SURGERY Right February 2016   Vacuum assisted biopsy for recurrent cyst, fibrocystic changes without evidence of malignancy.   CATARACT EXTRACTION W/PHACO Left 01/13/2015   Procedure: CATARACT EXTRACTION PHACO AND INTRAOCULAR LENS PLACEMENT (IOC);  Surgeon: Elsie Carmine, MD;  Location: [facility] ORS;  Service: Ophthalmology;  Laterality: Left;  US   1:02.9 AP   19.2 CDE  12.06 casette lot #  [id number]   CATARACT EXTRACTION W/PHACO Right 02/03/2015   Procedure: CATARACT EXTRACTION PHACO AND INTRAOCULAR LENS PLACEMENT (IOC);  Surgeon: Elsie Carmine, MD;  Location: [facility] ORS;  Service: Ophthalmology;  Laterality: Right;  [id number]:53 ap46.5 [id number]   COLECTOMY     COLONOSCOPY  2015   Dr Ora   COLONOSCOPY WITH PROPOFOL  N/A 09/13/2017   Procedure: COLONOSCOPY WITH PROPOFOL ;  Surgeon: Dessa [name], MD;  Location: [facility] ENDOSCOPY;  Service: Endoscopy;  Laterality: N/A;   COLONOSCOPY WITH PROPOFOL  N/A 11/08/2017   Procedure: COLONOSCOPY WITH PROPOFOL ;  Surgeon: Dessa [name], MD;  Location: [facility] ENDOSCOPY;  Service: Endoscopy;  Laterality: N/A;   colostomy reversal     DILATION AND CURETTAGE OF UTERUS  2014   Dr. Cleotilde, benign per pt   EYE SURGERY     HERNIA REPAIR  07/13/12   ventral    SKIN CANCER EXCISION     TONSILLECTOMY      SOCIAL HISTORY: Social History   Socioeconomic History   Marital status: Married    Spouse name: Not on file   Number of children: Not on file   Years of education: Not on file   Highest education level: 12th grade  Occupational History   Not on file  Tobacco Use    Smoking status: Former    Current packs/day: 0.00    Types: Cigarettes    Quit date: 10/12/2007    Years since quitting: 16.3   Smokeless tobacco: Never  Vaping Use   Vaping status: Never Used  Substance and Sexual Activity   Alcohol use: Yes    Alcohol/week: 1.0 standard drink of alcohol    Types: 1 Standard drinks or equivalent per week    Comment: with dinner GLASS OF WINE EACH DAY   Drug use: No   Sexual activity: Not on file  Other Topics Concern   Not on file  Social History Narrative   Lives in [place] with husband. 2 children, son lives nearby.Diet - regularExercise - none. 30 mins/in [place]. Quit smoking in 2009. Wine before dinner. Pianist in church; real estate; diplomatic services operational officer.    Social Drivers of Corporate Investment Banker Strain: Low Risk  (11/23/2023)   Overall Financial Resource Strain (CARDIA)    Difficulty of Paying Living Expenses: Not hard at all  Food Insecurity: No Food Insecurity (11/23/2023)   Hunger Vital Sign    Worried About Running Out of Food in the Last Year: Never true    Ran Out of Food in the Last Year: Never true  Transportation Needs: No Transportation Needs (11/23/2023)   PRAPARE - Administrator, Civil Service (Medical): No    Lack of Transportation (Non-Medical): No  Physical Activity: Insufficiently Active (11/23/2023)   Exercise Vital Sign    Days of Exercise per Week: 1 day    Minutes of Exercise per Session: 30 min  Stress: No Stress Concern Present (11/23/2023)   [employer] of Occupational Health - Occupational Stress Questionnaire    Feeling of Stress: Not at all  Social Connections: Socially Integrated (11/23/2023)   Social Connection and Isolation Panel    Frequency of Communication with Friends and Family: More than three times a week    Frequency of Social Gatherings with Friends and Family: Three times a week    Attends Religious Services: More than 4 times per year    Active Member of Clubs or  Organizations: Yes    Attends Banker Meetings: More than 4 times per year    Marital Status: Married  Catering Manager Violence: Not At Risk (11/27/2023)   Humiliation, Afraid, Rape, and Kick questionnaire    Fear of Current or Ex-Partner: No    Emotionally Abused: No    Physically Abused: No    Sexually Abused: No    FAMILY HISTORY: Family History  Problem Relation Age of Onset   Stroke Mother    Diabetes Mother    Colon cancer Father        dx 63s   Stroke Sister    Colon cancer Brother        dx 72s-70s   Brain cancer Brother        dx 60s-70s   Breast cancer Neg Hx     ALLERGIES:  is allergic to sulfa antibiotics.  MEDICATIONS:  Current Outpatient Medications  Medication Sig Dispense Refill   anastrozole  (ARIMIDEX ) 1 MG tablet TAKE 1 TABLET BY MOUTH DAILY 90 tablet 0   atorvastatin  (LIPITOR) 10 MG tablet Take 1 tablet (10 mg total) by mouth daily. 90 tablet 3   budesonide-glycopyrrolate -formoterol (BREZTRI  AEROSPHERE) 160-9-4.8 MCG/ACT AERO inhaler Inhale 2 puffs into the lungs in the morning and at bedtime. 1 each 11   furosemide  (LASIX ) 20 MG tablet TAKE 1 TABLET BY MOUTH EVERY OTHER DAY 30 tablet 1   halobetasol  (ULTRAVATE ) 0.05 % cream Apply topically to aa's of rash at body twice daily on weekends only as needed. Avoid applying to face, groin, and axilla. Use as directed. 50 g 1  loratadine (CLARITIN) 10 MG tablet Take 10 mg by mouth daily.     niacinamide 500 MG tablet Take 500 mg by mouth 2 (two) times daily.     Omega-3 Fatty Acids (FISH OIL PO) Take 1 capsule by mouth daily.     potassium chloride  SA (KLOR-CON  M) 20 MEQ tablet TAKE 1 TABLET BY MOUTH EVERY OTHER DAY 30 tablet 0   ribociclib  succ (KISQALI  400MG  DAILY DOSE) 200 MG Therapy Pack Take 2 tablets (400 mg total) by mouth daily. Take for 21 days on, 7 days off, repeat every 28 days. 42 tablet 1   Spacer/Aero-Holding Chambers (AEROCHAMBER MV) inhaler Use as instructed 1 each 0   tacrolimus   (PROTOPIC ) 0.1 % ointment Apply topically to affected areas of rash twice daily on week days only prn 60 g 1   No current facility-administered medications for this visit.   PHYSICAL EXAMINATION: Vitals:   02/12/24 1353  BP: (!) 151/72  Pulse: 77  Resp: 20  Temp: 98.6 F (37 C)  SpO2: 100%   Filed Weights   02/12/24 1353  Weight: 175 lb 9.6 oz (79.7 kg)   Physical Exam Vitals and nursing note reviewed.  HENT:     Head: Normocephalic and atraumatic.     Mouth/Throat:     Pharynx: Oropharynx is clear.  Eyes:     Extraocular Movements: Extraocular movements intact.     Pupils: Pupils are equal, round, and reactive to light.  Cardiovascular:     Rate and Rhythm: Normal rate and regular rhythm.     Heart sounds: Murmur heard.  Pulmonary:     Comments: Decreased breath sounds bilaterally.  Abdominal:     Palpations: Abdomen is soft.  Musculoskeletal:        General: Normal range of motion.     Cervical back: Normal range of motion.  Skin:    General: Skin is warm.  Neurological:     General: No focal deficit present.     Mental Status: She is alert and oriented to person, place, and time.  Psychiatric:        Behavior: Behavior normal.        Judgment: Judgment normal.     LABORATORY DATA:  I have reviewed the data as listed Lab Results  Component Value Date   WBC 3.6 (L) 02/12/2024   HGB 10.7 (L) 02/12/2024   HCT 31.9 (L) 02/12/2024   MCV 107.8 (H) 02/12/2024   PLT 149 (L) 02/12/2024   Recent Labs    12/05/23 1306 01/08/24 1443 01/29/24 1429  NA 135 135 135  K 4.2 4.0 4.3  CL 101 101 103  CO2 23 24 24   GLUCOSE 103* 111* 116*  BUN 22 23 22   CREATININE 1.40* 0.94 1.25*  CALCIUM  10.5* 9.6 9.2  GFRNONAA 38* >60 43*  PROT 7.1 7.0 6.8  ALBUMIN 3.9 4.2 4.0  AST 22 29 23   ALT 14 20 16   ALKPHOS 51 49 48  BILITOT 0.7 0.8 0.9    RADIOGRAPHIC STUDIES: I have personally reviewed the radiological images as listed and agreed with the findings in the  report. No results found.  ASSESSMENT & PLAN:   Carcinoma of overlapping sites of right breast in female, estrogen receptor positive  # Right breast cancer -[facility]; ER/PR positive Her 2 Negative ;  G-3; LVI + T2N1.  Stage II [OCT 2023; Dr.Byrnett] s/p Lumpectomy; node positive-Oncotype RS- 35- s/p  adjuvant chemotherapy with Taxotere -Cytoxan  every 3 weeks x4 cycles.  S/p postlumpectomy  radiation April 3rd 2024. Poor tolerance to adjuvant abemaciclib  even at 50 mg BID [started second week of June- stopped in AUG 2025].   # Adjuvant abemaciclib  discontinued because of poor tolerance.  Given the high risk features-recommend adjuvant CDK inhibitor- Kisquali x3 years.  Continue Anastrazole x10 years.    # Tolerating because Quale better.  Mild diarrhea.  Neuropathy is unchanged.  Weakness has improved.  ECG today shows QTcF of 438 msec. Ok to proceed with ribociclib . Appreciate pharmacy input.    # BIL LE weakness-sec to  verzinio-improved since stopping Verzenio .  Status post evaluation with [facility].  Improving.    # Bone health-2023 osteopenic T-score of -1.1.Continue calcium  plus vitamin D .- ON [JAN 2025- #1 of planned- 6] adjuvant Zometa  every 6 months x 3 years. S/p dental evaluation - stable. Continue adjuvant Zometa - reduce the dose to 3 mg [given GFR 36]   # COPD [Dr.Dgayli]-on Breztri /albuterol  -worse- would recommend evaluation with pulmonary.     # [2nd,FEB 2024] CTA-  acute pulmonary embolism/ LEFT LE- Acute DVT-- [provoked sec to chemo]. Stopped eliquis  in End of  AUG 2024. Stable.     # CKD- stage III- monitor for now./ APRIL 2025- CA 10.4; ca+vitD on stable.     # Vaccination: s/p COVID; Flu; OK with RSV.    # Fatigue: thought to be related to deconditioning. Ok to proceed with exercise/care program.    # IV Access: PIV    # Zometa  - 7/11-2023 q 6 m Dixie.dillon ]   # DISPOSITION: Follow up as scheduled- la  No problem-specific Assessment & Plan notes found for this encounter.  All  questions were answered. The patient/family knows to call the clinic with any problems, questions or concerns.   Sabrina KANDICE Dawn, NP 02/12/2024

## 2024-02-13 ENCOUNTER — Encounter: Admitting: *Deleted

## 2024-02-13 DIAGNOSIS — C50811 Malignant neoplasm of overlapping sites of right female breast: Secondary | ICD-10-CM

## 2024-02-13 NOTE — Progress Notes (Signed)
 Daily Session Note  Patient Details  Name: Roshni Burbano MRN: [id number] Date of Birth: 10/01/1940 Referring Provider:   [facility] & Exercise from 01/16/2024 in [facility] Cardiac and Pulmonary Rehab  Referring Provider Rennie Lighter, MD    Encounter Date: 02/13/2024  Check In:  Session Check In - 02/13/24 1236       Check-In   Supervising physician immediately available to respond to emergencies See telemetry face sheet for immediately available ER MD    Location [facility]-Cardiac & Pulmonary Rehab    Staff Present Hoy Rodney RN,BSN;Noah Tickle, BS, Exercise Physiologist    Virtual Visit No    Medication changes reported     No    Fall or balance concerns reported    No    Warm-up and Cool-down Performed on first and last piece of equipment    Resistance Training Performed Yes    VAD Patient? No    PAD/SET Patient? No      Pain Assessment   Currently in Pain? No/denies             Social History   Tobacco Use  Smoking Status Former   Current packs/day: 0.00   Types: Cigarettes   Quit date: 10/12/2007   Years since quitting: 16.3  Smokeless Tobacco Never    Goals Met:  Independence with exercise equipment Exercise tolerated well No report of concerns or symptoms today Strength training completed today  Goals Unmet:  Not Applicable  Comments: Pt able to follow exercise prescription today without complaint.  Will continue to monitor for progression.    Dr. Oneil Pinal is Medical Director for [facility] Cardiac Rehabilitation.  Dr. Fuad Aleskerov is Medical Director for [facility] Pulmonary Rehabilitation.

## 2024-02-15 ENCOUNTER — Encounter: Admitting: *Deleted

## 2024-02-15 DIAGNOSIS — C50811 Malignant neoplasm of overlapping sites of right female breast: Secondary | ICD-10-CM

## 2024-02-15 NOTE — Progress Notes (Signed)
 Daily Session Note  Patient Details  Name: Sabrina Wilson MRN: [id number] Date of Birth: 06-01-1940 Referring Provider:   [facility] Associated Rehabilitation & Exercise from 01/16/2024 in [facility] Cardiac and Pulmonary Rehab  Referring Provider Rennie Lighter, MD    Encounter Date: 02/15/2024  Check In:  Session Check In - 02/15/24 1225       Check-In   Supervising physician immediately available to respond to emergencies See telemetry face sheet for immediately available ER MD    Location [facility]-Cardiac & Pulmonary Rehab    Staff Present Rollene Paterson, MS, Exercise Physiologist;Bronsen Serano Tressa RN,BSN    Virtual Visit No    Medication changes reported     No    Fall or balance concerns reported    No    Warm-up and Cool-down Performed on first and last piece of equipment    Resistance Training Performed Yes    VAD Patient? No    PAD/SET Patient? No      Pain Assessment   Currently in Pain? No/denies             Social History   Tobacco Use  Smoking Status Former   Current packs/day: 0.00   Types: Cigarettes   Quit date: 10/12/2007   Years since quitting: 16.3  Smokeless Tobacco Never    Goals Met:  Independence with exercise equipment Exercise tolerated well No report of concerns or symptoms today Strength training completed today  Goals Unmet:  Not Applicable  Comments: Pt able to follow exercise prescription today without complaint.  Will continue to monitor for progression.    Dr. Oneil Pinal is Medical Director for [facility] Cardiac Rehabilitation.  Dr. Fuad Aleskerov is Medical Director for [facility] Pulmonary Rehabilitation.

## 2024-02-20 ENCOUNTER — Encounter

## 2024-02-20 DIAGNOSIS — C50811 Malignant neoplasm of overlapping sites of right female breast: Secondary | ICD-10-CM

## 2024-02-20 NOTE — Progress Notes (Signed)
 Daily Session Note  Patient Details  Name: Sabrina Wilson MRN: [id number] Date of Birth: 22-Nov-1940 Referring Provider:   [facility] Associated Rehabilitation & Exercise from 01/16/2024 in [facility] Cardiac and Pulmonary Rehab  Referring Provider Rennie Lighter, MD    Encounter Date: 02/20/2024  Check In:  Session Check In - 02/20/24 1223       Check-In   Supervising physician immediately available to respond to emergencies See telemetry face sheet for immediately available ER MD    Location [facility]-Cardiac & Pulmonary Rehab    Staff Present Rollene Paterson, MS, Exercise Physiologist    Virtual Visit No    Medication changes reported     No    Fall or balance concerns reported    No    Warm-up and Cool-down Performed on first and last piece of equipment    Resistance Training Performed Yes    VAD Patient? No    PAD/SET Patient? No      Pain Assessment   Multiple Pain Sites No             Social History   Tobacco Use  Smoking Status Former   Current packs/day: 0.00   Types: Cigarettes   Quit date: 10/12/2007   Years since quitting: 16.3  Smokeless Tobacco Never    Goals Met:  Independence with exercise equipment Exercise tolerated well No report of concerns or symptoms today  Goals Unmet:  Not Applicable  Comments: Pt able to follow exercise prescription today without complaint.  Will continue to monitor for progression.    Dr. Oneil Pinal is Medical Director for [facility] Cardiac Rehabilitation.  Dr. Fuad Aleskerov is Medical Director for [facility] Pulmonary Rehabilitation.

## 2024-02-22 ENCOUNTER — Encounter

## 2024-02-26 ENCOUNTER — Other Ambulatory Visit: Payer: Self-pay | Admitting: *Deleted

## 2024-02-26 DIAGNOSIS — C50811 Malignant neoplasm of overlapping sites of right female breast: Secondary | ICD-10-CM

## 2024-02-27 ENCOUNTER — Other Ambulatory Visit: Payer: Self-pay

## 2024-02-27 ENCOUNTER — Other Ambulatory Visit: Payer: Self-pay | Admitting: Internal Medicine

## 2024-02-27 ENCOUNTER — Inpatient Hospital Stay: Admitting: Internal Medicine

## 2024-02-27 ENCOUNTER — Encounter: Attending: Internal Medicine

## 2024-02-27 ENCOUNTER — Inpatient Hospital Stay: Attending: Internal Medicine

## 2024-02-27 ENCOUNTER — Encounter: Payer: Self-pay | Admitting: Internal Medicine

## 2024-02-27 VITALS — BP 143/71 | HR 76 | Temp 98.5°F | Resp 24 | Ht 66.9 in | Wt 177.0 lb

## 2024-02-27 DIAGNOSIS — C50811 Malignant neoplasm of overlapping sites of right female breast: Secondary | ICD-10-CM | POA: Insufficient documentation

## 2024-02-27 DIAGNOSIS — Z800 Family history of malignant neoplasm of digestive organs: Secondary | ICD-10-CM | POA: Diagnosis not present

## 2024-02-27 DIAGNOSIS — Z79899 Other long term (current) drug therapy: Secondary | ICD-10-CM | POA: Diagnosis not present

## 2024-02-27 DIAGNOSIS — Z86718 Personal history of other venous thrombosis and embolism: Secondary | ICD-10-CM | POA: Diagnosis not present

## 2024-02-27 DIAGNOSIS — Z923 Personal history of irradiation: Secondary | ICD-10-CM | POA: Diagnosis not present

## 2024-02-27 DIAGNOSIS — Z87891 Personal history of nicotine dependence: Secondary | ICD-10-CM | POA: Diagnosis not present

## 2024-02-27 DIAGNOSIS — Z170 Estrogen receptor positive status [ER+]: Secondary | ICD-10-CM

## 2024-02-27 DIAGNOSIS — Z933 Colostomy status: Secondary | ICD-10-CM | POA: Diagnosis not present

## 2024-02-27 DIAGNOSIS — Z86711 Personal history of pulmonary embolism: Secondary | ICD-10-CM | POA: Insufficient documentation

## 2024-02-27 DIAGNOSIS — Z9221 Personal history of antineoplastic chemotherapy: Secondary | ICD-10-CM | POA: Diagnosis not present

## 2024-02-27 DIAGNOSIS — Z1721 Progesterone receptor positive status: Secondary | ICD-10-CM | POA: Diagnosis not present

## 2024-02-27 DIAGNOSIS — Z1732 Human epidermal growth factor receptor 2 negative status: Secondary | ICD-10-CM | POA: Insufficient documentation

## 2024-02-27 DIAGNOSIS — Z79811 Long term (current) use of aromatase inhibitors: Secondary | ICD-10-CM | POA: Diagnosis not present

## 2024-02-27 DIAGNOSIS — Z808 Family history of malignant neoplasm of other organs or systems: Secondary | ICD-10-CM | POA: Insufficient documentation

## 2024-02-27 LAB — CBC WITH DIFFERENTIAL (CANCER CENTER ONLY)
Abs Immature Granulocytes: 0.02 K/uL (ref 0.00–0.07)
Basophils Absolute: 0.1 K/uL (ref 0.0–0.1)
Basophils Relative: 3 %
Eosinophils Absolute: 0.1 K/uL (ref 0.0–0.5)
Eosinophils Relative: 4 %
HCT: 32.2 % — ABNORMAL LOW (ref 36.0–46.0)
Hemoglobin: 10.7 g/dL — ABNORMAL LOW (ref 12.0–15.0)
Immature Granulocytes: 1 %
Lymphocytes Relative: 19 %
Lymphs Abs: 0.6 K/uL — ABNORMAL LOW (ref 0.7–4.0)
MCH: 36.3 pg — ABNORMAL HIGH (ref 26.0–34.0)
MCHC: 33.2 g/dL (ref 30.0–36.0)
MCV: 109.2 fL — ABNORMAL HIGH (ref 80.0–100.0)
Monocytes Absolute: 0.3 K/uL (ref 0.1–1.0)
Monocytes Relative: 9 %
Neutro Abs: 2.1 K/uL (ref 1.7–7.7)
Neutrophils Relative %: 64 %
Platelet Count: 188 K/uL (ref 150–400)
RBC: 2.95 MIL/uL — ABNORMAL LOW (ref 3.87–5.11)
RDW: 15.8 % — ABNORMAL HIGH (ref 11.5–15.5)
Smear Review: NORMAL
WBC Count: 3.3 K/uL — ABNORMAL LOW (ref 4.0–10.5)
nRBC: 0 % (ref 0.0–0.2)

## 2024-02-27 LAB — CMP (CANCER CENTER ONLY)
ALT: 16 U/L (ref 0–44)
AST: 24 U/L (ref 15–41)
Albumin: 3.8 g/dL (ref 3.5–5.0)
Alkaline Phosphatase: 47 U/L (ref 38–126)
Anion gap: 11 (ref 5–15)
BUN: 29 mg/dL — ABNORMAL HIGH (ref 8–23)
CO2: 25 mmol/L (ref 22–32)
Calcium: 10.1 mg/dL (ref 8.9–10.3)
Chloride: 101 mmol/L (ref 98–111)
Creatinine: 1.27 mg/dL — ABNORMAL HIGH (ref 0.44–1.00)
GFR, Estimated: 42 mL/min — ABNORMAL LOW (ref >60)
Glucose, Bld: 167 mg/dL — ABNORMAL HIGH (ref 70–99)
Potassium: 4.3 mmol/L (ref 3.5–5.1)
Sodium: 137 mmol/L (ref 135–145)
Total Bilirubin: 0.7 mg/dL (ref 0.0–1.2)
Total Protein: 6.7 g/dL (ref 6.5–8.1)

## 2024-02-27 LAB — MAGNESIUM: Magnesium: 1.8 mg/dL (ref 1.7–2.4)

## 2024-02-27 MED ORDER — RIBOCICLIB SUCC (400 MG DOSE) 200 MG PO TBPK
400.0000 mg | ORAL_TABLET | Freq: Every day | ORAL | 1 refills | Status: DC
  Filled 2024-02-28: qty 42, 21d supply, fill #0
  Filled 2024-02-29: qty 42, 28d supply, fill #0
  Filled 2024-03-26: qty 42, 28d supply, fill #1

## 2024-02-27 NOTE — Progress Notes (Signed)
  Cancer Center CONSULT NOTE  Patient Care Team: Bair, Kalpana, MD as PCP - General (Family Medicine) [name], MD as PCP - Cardiology (Cardiology) Dessa, [name], MD as Consulting Physician (General Surgery) [name], MD (Internal Medicine) [name], RN as Oncology Nurse Navigator [name], MD as Consulting Physician (Radiation Oncology) [name], MD as Consulting Physician (Oncology) [name], DO as Consulting Physician (General Surgery)  CHIEF COMPLAINTS/PURPOSE OF CONSULTATION: Breast cancer  #  Oncology History Overview Note  # 2009- Sigmoid colon cancer stage II (T3 N0 4 lymph nodes sampled M0 stage II high risk there was obstruction and perforation abscess; Dr.Hern), workup included a low CEA preoperatively and a chest x-ray and CT scan abdomen pelvis negative for metastatic disease, S/P colostomy; s/p FOLFOX x 6 months. [Dr.Gittin ]  # LATER REVERSED , K RAS  wild type B RAF not evaluated   Also initial iron deficiency with anemia thrombocytosis, high platelets considered reactive, workup includes a normal PCR for CML and a negative [id number] mutation  DIAGNOSIS:  A. BREAST, RIGHT; LUMPECTOMY:  - INVASIVE MAMMARY CARCINOMA.  - DUCTAL CARCINOMA IN SITU (DCIS).  - SEE CANCER SUMMARY BELOW.  - BIOPSY SITE CHANGE WITH CLIP (2).  - FIBROUS TISSUE WITH CYST FORMATION AND FLORID DUCTAL HYPERPLASIA.  - SKIN AND NIPPLE NEGATIVE FOR MALIGNANCY.  - VASCULAR CALCIFICATION.   B. SENTINEL LYMPH NODES 1 AND 2, RIGHT AXILLA; EXCISION:  - METASTATIC CARCINOMA INVOLVES ONE OF TWO LYMPH NODES (1/2).   C. NON-SENTINEL LYMPH NODES, RIGHT AXILLA; EXCISION:  - ONE LYMPH NODE NEGATIVE FOR MALIGNANCY (0/1).   CANCER CASE SUMMARY: INVASIVE CARCINOMA OF THE BREAST  Standard(s): AJCC-UICC 8   SPECIMEN  Procedure: Lumpectomy  Specimen Laterality: Right   TUMOR  Histologic Type: Invasive carcinoma of no special type (ductal)   Histologic Grade (Nottingham Histologic Score)       Glandular (Acinar)/Tubular Differentiation: 3       Nuclear Pleomorphism: 3       Mitotic Rate: 3       Overall Grade: 3  Tumor Size: 24 mm  Tumor Focality: Single focus of invasive carcinoma  Ductal Carcinoma In Situ (DCIS): Present, nuclear grade 2-3  Tumor Extent: Not applicable  Lymphatic and/or Vascular Invasion: Present  Treatment Effect in the Breast: No known presurgical therapy   MARGINS  Margin Status for Invasive Carcinoma: All margins negative for invasive  carcinoma       Distance from closest margin: 10 mm       Specify closest margin: Superior   Margin Status for DCIS: All margins negative for DCIS       Distance from DCIS to closest margin: 10 mm       Specify closest margin: Superior   REGIONAL LYMPH NODES  Regional Lymph Node Status: Tumor present in regional lymph node(s)       Number of Lymph Nodes with Macrometastases (greater than 2 mm): 1       Number of Lymph Nodes with Micrometastases (greater than 0.2 mm to  2 mm and/or greater than 200 cells): 0       Number of Lymph Nodes with Isolated Tumor Cells (0.2 mm or less OR  200 cells or less): 0       Size of Largest Metastatic Deposit: 3 mm      Extranodal Extension: Not identified       Total Number of Lymph Nodes Examined (sentinel and non-sentinel): 3  Number of Sentinel Nodes Examined: 2   DISTANT METASTASIS  Distant Site(s) Involved, if applicable: Not applicable   PATHOLOGIC STAGE CLASSIFICATION (pTNM, AJCC 8th Edition):  Modified Classification: Not applicable  pT Category: pT2  T Suffix: Not applicable  pN Category: pN1a  N Suffix: (sn)  pM Category: Not applicable   SPECIAL STUDIES  Breast Biomarker Testing Performed on Previous Outside Biopsy:  23-250-T11  Per outside report:  Estrogen Receptor (ER) Status: POSITIVE          Percentage of cells with nuclear positivity: Greater than 90%          Average intensity of staining:  Strong   Progesterone Receptor (PgR) Status: POSITIVE          Percentage of cells with nuclear positivity: 60%          Average intensity of staining: Strong   HER2 (by immunohistochemistry): EQUIVOCAL (Score 2+)  HER2 FISH: NEGATIVE   Ki-67: Not performed   # Cytoxan  Taxotere  x 4 cycles; finished JAN, 2024. [Neutropenic fevers s/p cycle #4;   -FEB 2nd, 2024-right lung PE; left lower LE  DVT- on eliquis .   # FEB 19th-start RT; mpectomy radiation April 3rd 2024.  # anastrozole  once a day [April 2024]-t # Poor tolerance to adjuvant abemaciclib  100 mg BID [started second week of June] stopped x 1 month.    # JAN 8th 2024- start abemaciclib  50 mg BID- STOPPED in AUG 2025- sec to intolerance.   # LATE SEP 2025- start RIBOCICLIB  400 mg 3 w- on and 1 week OFF      Cancer of sigmoid (HCC) (Resolved)  Carcinoma of overlapping sites of right breast in female, estrogen receptor positive (HCC)  02/14/2022 Initial Diagnosis   Carcinoma of overlapping sites of right breast in female, estrogen receptor positive (HCC)   02/14/2022 Cancer Staging   Staging form: Breast, AJCC 8th Edition - Pathologic: Stage IIA (pT2, pN1, cM0, G3, ER+, PR+, HER2-) - Signed by [name], MD on 02/14/2022 Histologic grading system: 3 grade system   02/23/2022 -  Chemotherapy   Patient is on Treatment Plan : BREAST TC q21d      HISTORY OF PRESENTING ILLNESS:   Alone.  Ambulating independently.   Sabrina Wilson 83 y.o.  female patient with ER/PR positive Her 2 Negative  breast cancer stage II T2 N1-currently  adjuvant anastrozole -and kisqali    is here for a follow up.   Patient fatigue is improved.  Mild neuropathy not any worse.  Mild intermittent diarrhea not any worse.  Review of Systems  Constitutional:  Positive for malaise/fatigue. Negative for chills, diaphoresis and fever.  HENT:  Negative for nosebleeds and sore throat.   Eyes:  Negative for double vision.  Respiratory:  Negative  for hemoptysis, sputum production and wheezing.   Cardiovascular:  Negative for chest pain, palpitations, orthopnea and leg swelling.  Gastrointestinal:  Negative for blood in stool, constipation, diarrhea, heartburn, melena and vomiting.  Genitourinary:  Negative for dysuria, frequency and urgency.  Musculoskeletal:  Positive for back pain and joint pain.  Skin: Negative.  Negative for itching and rash.  Neurological:  Negative for dizziness, tingling, focal weakness, weakness and headaches.  Endo/Heme/Allergies:  Does not bruise/bleed easily.  Psychiatric/Behavioral:  Negative for depression. The patient is not nervous/anxious and does not have insomnia.      MEDICAL HISTORY:  Past Medical History:  Diagnosis Date   Actinic keratosis 02/12/2020   R lat calf (hypertrophic)   Arthritis  Cancer of sigmoid (HCC) 06/17/2016   Colon cancer (HCC) 2009   T3,N0; s/p resection  Dr. Primus and chemotherapy by Dr. Brooks   Diastolic dysfunction    a. 08/2020 Echo: EF 60-65%, no rwma, Gr1 DD, nl RV size/fxn. Mild MR.   DVT (deep venous thrombosis) (HCC) 05/29/2022   Hernia of flank    History of actinic keratoses 08/11/2020   Bx proven at left lateral ankle proximal, LN2 10/08/20   History of stress test    a. 09/2020 MV: EF >65%. No ischemia/infarct. Mild Ao Ca2+ and minimal Cor Ca2+.   Hypertension    Hypomagnesemia 05/29/2022   Hyponatremia 05/29/2022   Neuropathy    Personal history of radiation therapy 2023   right breast CA   Polyp at cervical os    s/p resection Dr. Cleotilde   Pulmonary embolism (HCC) 05/27/2022   Skin cancer 01/22/2020   surface of an atypical verrucous squamous proliferation    Squamous cell carcinoma of skin 07/16/2020   Left lateral ankle anterior, treated with [facility] 08-11-2020    SURGICAL HISTORY: Past Surgical History:  Procedure Laterality Date   BREAST BIOPSY Right 05/2014   Dr. Fredirick office-benign   BREAST LUMPECTOMY WITH SENTINEL LYMPH NODE BIOPSY  Right 01/21/2022   Procedure: BREAST LUMPECTOMY WITH SENTINEL LYMPH NODE BX;  Surgeon: Dessa [name], MD;  Location: [facility] ORS;  Service: General;  Laterality: Right;   BREAST SURGERY Right February 2016   Vacuum assisted biopsy for recurrent cyst, fibrocystic changes without evidence of malignancy.   CATARACT EXTRACTION W/PHACO Left 01/13/2015   Procedure: CATARACT EXTRACTION PHACO AND INTRAOCULAR LENS PLACEMENT (IOC);  Surgeon: Elsie Carmine, MD;  Location: [facility] ORS;  Service: Ophthalmology;  Laterality: Left;  US   1:02.9 AP   19.2 CDE  12.06 casette lot #  [id number]   CATARACT EXTRACTION W/PHACO Right 02/03/2015   Procedure: CATARACT EXTRACTION PHACO AND INTRAOCULAR LENS PLACEMENT (IOC);  Surgeon: Elsie Carmine, MD;  Location: [facility] ORS;  Service: Ophthalmology;  Laterality: Right;  us00:53 ap46.5 cde10.79   COLECTOMY     COLONOSCOPY  2015   Dr Ora   COLONOSCOPY WITH PROPOFOL  N/A 09/13/2017   Procedure: COLONOSCOPY WITH PROPOFOL ;  Surgeon: Dessa [name], MD;  Location: [facility] ENDOSCOPY;  Service: Endoscopy;  Laterality: N/A;   COLONOSCOPY WITH PROPOFOL  N/A 11/08/2017   Procedure: COLONOSCOPY WITH PROPOFOL ;  Surgeon: Dessa [name], MD;  Location: [facility] ENDOSCOPY;  Service: Endoscopy;  Laterality: N/A;   colostomy reversal     DILATION AND CURETTAGE OF UTERUS  2014   Dr. Cleotilde, benign per pt   EYE SURGERY     HERNIA REPAIR  07/13/12   ventral    SKIN CANCER EXCISION     TONSILLECTOMY      SOCIAL HISTORY: Social History   Socioeconomic History   Marital status: Married    Spouse name: Not on file   Number of children: Not on file   Years of education: Not on file   Highest education level: 12th grade  Occupational History   Not on file  Tobacco Use   Smoking status: Former    Current packs/day: 0.00    Types: Cigarettes    Quit date: 10/12/2007    Years since quitting: 16.3   Smokeless tobacco: Never  Vaping Use   Vaping status: Never Used  Substance and  Sexual Activity   Alcohol use: Yes    Alcohol/week: 1.0 standard drink of alcohol    Types: 1 Standard drinks or  equivalent per week    Comment: with dinner GLASS OF WINE EACH DAY   Drug use: No   Sexual activity: Not on file  Other Topics Concern   Not on file  Social History Narrative   Lives in [place] with husband. 2 children, son lives nearby.Diet - regularExercise - none. 30 mins/in [place]. Quit smoking in 2009. Wine before dinner. Pianist in church; real estate; diplomatic services operational officer.    Social Drivers of Corporate Investment Banker Strain: Low Risk  (11/23/2023)   Overall Financial Resource Strain (CARDIA)    Difficulty of Paying Living Expenses: Not hard at all  Food Insecurity: No Food Insecurity (11/23/2023)   Hunger Vital Sign    Worried About Running Out of Food in the Last Year: Never true    Ran Out of Food in the Last Year: Never true  Transportation Needs: No Transportation Needs (11/23/2023)   PRAPARE - Administrator, Civil Service (Medical): No    Lack of Transportation (Non-Medical): No  Physical Activity: Insufficiently Active (11/23/2023)   Exercise Vital Sign    Days of Exercise per Week: 1 day    Minutes of Exercise per Session: 30 min  Stress: No Stress Concern Present (11/23/2023)   [employer] of Occupational Health - Occupational Stress Questionnaire    Feeling of Stress: Not at all  Social Connections: Socially Integrated (11/23/2023)   Social Connection and Isolation Panel    Frequency of Communication with Friends and Family: More than three times a week    Frequency of Social Gatherings with Friends and Family: Three times a week    Attends Religious Services: More than 4 times per year    Active Member of Clubs or Organizations: Yes    Attends Banker Meetings: More than 4 times per year    Marital Status: Married  Catering Manager Violence: Not At Risk (11/27/2023)   Humiliation, Afraid, Rape, and Kick questionnaire     Fear of Current or Ex-Partner: No    Emotionally Abused: No    Physically Abused: No    Sexually Abused: No    FAMILY HISTORY: Family History  Problem Relation Age of Onset   Stroke Mother    Diabetes Mother    Colon cancer Father        dx 56s   Stroke Sister    Colon cancer Brother        dx 9s-70s   Brain cancer Brother        dx 60s-70s   Breast cancer Neg Hx     ALLERGIES:  is allergic to sulfa antibiotics.  MEDICATIONS:  Current Outpatient Medications  Medication Sig Dispense Refill   anastrozole  (ARIMIDEX ) 1 MG tablet TAKE 1 TABLET BY MOUTH DAILY 90 tablet 0   atorvastatin  (LIPITOR) 10 MG tablet Take 1 tablet (10 mg total) by mouth daily. 90 tablet 3   budesonide-glycopyrrolate -formoterol (BREZTRI  AEROSPHERE) 160-9-4.8 MCG/ACT AERO inhaler Inhale 2 puffs into the lungs in the morning and at bedtime. 1 each 11   furosemide  (LASIX ) 20 MG tablet TAKE 1 TABLET BY MOUTH EVERY OTHER DAY 30 tablet 1   halobetasol  (ULTRAVATE ) 0.05 % cream Apply topically to aa's of rash at body twice daily on weekends only as needed. Avoid applying to face, groin, and axilla. Use as directed. 50 g 1   loratadine (CLARITIN) 10 MG tablet Take 10 mg by mouth daily.     niacinamide 500 MG tablet Take 500 mg by mouth 2 (  two) times daily.     Omega-3 Fatty Acids (FISH OIL PO) Take 1 capsule by mouth daily.     potassium chloride  SA (KLOR-CON  M) 20 MEQ tablet TAKE 1 TABLET BY MOUTH EVERY OTHER DAY 30 tablet 0   ribociclib  succ (KISQALI  400MG  DAILY DOSE) 200 MG Therapy Pack Take 2 tablets (400 mg total) by mouth daily. Take for 21 days on, 7 days off, repeat every 28 days. 42 tablet 1   Spacer/Aero-Holding Chambers (AEROCHAMBER MV) inhaler Use as instructed 1 each 0   tacrolimus  (PROTOPIC ) 0.1 % ointment Apply topically to affected areas of rash twice daily on week days only prn 60 g 1   No current facility-administered medications for this visit.   PHYSICAL EXAMINATION:   Vitals:   02/27/24 1423  02/27/24 1522  BP: (!) 152/72 (!) 143/71  Pulse: 76   Resp: (!) 24   Temp: 98.5 F (36.9 C)   SpO2: 100%     Filed Weights   02/27/24 1423  Weight: 177 lb (80.3 kg)    Physical Exam Vitals and nursing note reviewed.  HENT:     Head: Normocephalic and atraumatic.     Mouth/Throat:     Pharynx: Oropharynx is clear.  Eyes:     Extraocular Movements: Extraocular movements intact.     Pupils: Pupils are equal, round, and reactive to light.  Cardiovascular:     Rate and Rhythm: Normal rate and regular rhythm.     Heart sounds: Murmur heard.  Pulmonary:     Comments: Decreased breath sounds bilaterally.  Abdominal:     Palpations: Abdomen is soft.  Musculoskeletal:        General: Normal range of motion.     Cervical back: Normal range of motion.  Skin:    General: Skin is warm.  Neurological:     General: No focal deficit present.     Mental Status: She is alert and oriented to person, place, and time.  Psychiatric:        Behavior: Behavior normal.        Judgment: Judgment normal.      LABORATORY DATA:  I have reviewed the data as listed Lab Results  Component Value Date   WBC 3.3 (L) 02/27/2024   HGB 10.7 (L) 02/27/2024   HCT 32.2 (L) 02/27/2024   MCV 109.2 (H) 02/27/2024   PLT 188 02/27/2024   Recent Labs    01/29/24 1429 02/12/24 1336 02/27/24 1433  NA 135 135 137  K 4.3 4.1 4.3  CL 103 102 101  CO2 24 22 25   GLUCOSE 116* 100* 167*  BUN 22 23 29*  CREATININE 1.25* 1.19* 1.27*  CALCIUM  9.2 9.5 10.1  GFRNONAA 43* 46* 42*  PROT 6.8 6.8 6.7  ALBUMIN 4.0 4.0 3.8  AST 23 27 24   ALT 16 16 16   ALKPHOS 48 40 47  BILITOT 0.9 0.8 0.7    RADIOGRAPHIC STUDIES: I have personally reviewed the radiological images as listed and agreed with the findings in the report. No results found.  ASSESSMENT & PLAN:   Carcinoma of overlapping sites of right breast in female, estrogen receptor positive (HCC) # Right breast cancer -[facility]; ER/PR positive Her 2  Negative ;  G-3; LVI + T2N1.  Stage II [OCT 2023; Dr.Byrnett] s/p Lumpectomy; node positive-Oncotype RS- 35- s/p  adjuvant chemotherapy with Taxotere -Cytoxan  every 3 weeks x4 cycles.  S/p postlumpectomy radiation April 3rd 2024. Poor tolerance to adjuvant abemaciclib  even at 50 mg BID [  started second week of June- stopped in AUG 2025].SEP 2025- started Kisqali .   # currently on adjuvant CDK inhibitor- Kisquali x3  years.  Continue Anastrazole x10 years.  Awaiting mammogram this week  # Bone health-2023 osteopenic T-score of -1.1.Continue calcium  plus vitamin D .- ON [JAN 2025- #1 of planned- 6] adjuvant Zometa  every 6 months x 3 years. S/p dental evaluation - stable. Continue adjuvant Zometa - reduce the dose to 3 mg [given GFR 36]  # COPD [Dr.Dgayli]-on Breztri /albuterol   stable.   # [2nd,FEB 2024] CTA-  acute pulmonary embolism/ LEFT LE- Acute DVT-- [provoked sec to chemo]. Stopped eliquis  in End of  AUG 2024. Stable.    # CKD- stage III- monitor for now./ APRIL 2025- CA 10.4; ca+vitD on stable.    # Vaccination: s/p COVID; Flu; OK with RSV.   # Fatigue:care program:   # IV Access: PIV   # Zometa  - 7/11-2023 q 6 m [3mg ]  # DISPOSITION: # follow in 6 weeks- MD;labs- cbc/cmp; Mag-   Dr.B     All questions were answered. The patient/family knows to call the clinic with any problems, questions or concerns.    Cindy JONELLE Joe, MD 02/27/2024 4:14 PM

## 2024-02-27 NOTE — Progress Notes (Signed)
 No concerns today

## 2024-02-27 NOTE — Progress Notes (Signed)
 Daily Session Note  Patient Details  Name: Tomi Grandpre MRN: [id number] Date of Birth: 1940/06/21 Referring Provider:   [facility] Cancer Associated Rehabilitation & Exercise from 01/16/2024 in [facility] Cardiac and Pulmonary Rehab  Referring Provider Rennie Lighter, MD    Encounter Date: 02/27/2024  Check In:  Session Check In - 02/27/24 1252       Check-In   Supervising physician immediately available to respond to emergencies See telemetry face sheet for immediately available ER MD    Location [facility]-Cardiac & Pulmonary Rehab    Staff Present Rollene Paterson, MS, Exercise Physiologist    Virtual Visit No    Medication changes reported     No    Fall or balance concerns reported    No    Warm-up and Cool-down Performed on first and last piece of equipment    Resistance Training Performed Yes    VAD Patient? No    PAD/SET Patient? No      Pain Assessment   Currently in Pain? No/denies    Multiple Pain Sites No             Social History   Tobacco Use  Smoking Status Former   Current packs/day: 0.00   Types: Cigarettes   Quit date: 10/12/2007   Years since quitting: 16.3  Smokeless Tobacco Never    Goals Met:  Independence with exercise equipment Exercise tolerated well No report of concerns or symptoms today  Goals Unmet:  Not Applicable  Comments: Pt able to follow exercise prescription today without complaint.  Will continue to monitor for progression.    Dr. Oneil Pinal is Medical Director for [facility] Cardiac Rehabilitation.  Dr. Fuad Aleskerov is Medical Director for [facility] Pulmonary Rehabilitation.

## 2024-02-27 NOTE — Assessment & Plan Note (Addendum)
#   Right breast cancer -[facility]; ER/PR positive Her 2 Negative ;  G-3; LVI + T2N1.  Stage II [OCT 2023; Dr.Byrnett] s/p Lumpectomy; node positive-Oncotype RS- 35- s/p  adjuvant chemotherapy with Taxotere -Cytoxan  every 3 weeks x4 cycles.  S/p postlumpectomy radiation April 3rd 2024. Poor tolerance to adjuvant abemaciclib  even at 50 mg BID [started second week of June- stopped in AUG 2025].SEP 2025- started Kisqali .   # currently on adjuvant CDK inhibitor- Kisquali x3  years.  Continue Anastrazole x10 years.  Awaiting mammogram this week  # Bone health-2023 osteopenic T-score of -1.1.Continue calcium  plus vitamin D .- ON [JAN 2025- #1 of planned- 6] adjuvant Zometa  every 6 months x 3 years. S/p dental evaluation - stable. Continue adjuvant Zometa - reduce the dose to 3 mg [given GFR 36]  # COPD [Dr.Dgayli]-on Breztri /albuterol   stable.   # [2nd,FEB 2024] CTA-  acute pulmonary embolism/ LEFT LE- Acute DVT-- [provoked sec to chemo]. Stopped eliquis  in End of  AUG 2024. Stable.    # CKD- stage III- monitor for now./ APRIL 2025- CA 10.4; ca+vitD on stable.    # Vaccination: s/p COVID; Flu; OK with RSV.   # Fatigue:care program:   # IV Access: PIV   # Zometa  - 7/11-2023 q 6 m [3mg ]  # DISPOSITION: # follow in 6 weeks- MD;labs- cbc/cmp; Mag-   Dr.B

## 2024-02-28 ENCOUNTER — Other Ambulatory Visit (HOSPITAL_COMMUNITY): Payer: Self-pay

## 2024-02-28 ENCOUNTER — Other Ambulatory Visit: Payer: Self-pay | Admitting: Internal Medicine

## 2024-02-28 ENCOUNTER — Other Ambulatory Visit: Payer: Self-pay

## 2024-02-28 DIAGNOSIS — Z1231 Encounter for screening mammogram for malignant neoplasm of breast: Secondary | ICD-10-CM

## 2024-02-28 DIAGNOSIS — R928 Other abnormal and inconclusive findings on diagnostic imaging of breast: Secondary | ICD-10-CM

## 2024-02-28 DIAGNOSIS — Z853 Personal history of malignant neoplasm of breast: Secondary | ICD-10-CM

## 2024-02-29 ENCOUNTER — Other Ambulatory Visit: Payer: Self-pay

## 2024-02-29 ENCOUNTER — Other Ambulatory Visit (HOSPITAL_COMMUNITY): Payer: Self-pay

## 2024-02-29 ENCOUNTER — Telehealth: Payer: Self-pay | Admitting: Pharmacy Technician

## 2024-02-29 ENCOUNTER — Encounter: Payer: Self-pay | Admitting: Internal Medicine

## 2024-02-29 ENCOUNTER — Encounter

## 2024-02-29 DIAGNOSIS — C50811 Malignant neoplasm of overlapping sites of right female breast: Secondary | ICD-10-CM

## 2024-02-29 NOTE — Progress Notes (Signed)
 Daily Session Note  Patient Details  Name: Sabrina Wilson MRN: [id number] Date of Birth: 09-14-1940 Referring Provider:   [facility] Cancer Associated Rehabilitation & Exercise from 01/16/2024 in [facility] Cardiac and Pulmonary Rehab  Referring Provider Rennie Lighter, MD    Encounter Date: 02/29/2024  Check In:  Session Check In - 02/29/24 1220       Check-In   Supervising physician immediately available to respond to emergencies See telemetry face sheet for immediately available ER MD    Location [facility]-Cardiac & Pulmonary Rehab    Staff Present Rollene Paterson, MS, Exercise Physiologist    Virtual Visit No    Medication changes reported     No    Fall or balance concerns reported    No    Warm-up and Cool-down Performed on first and last piece of equipment    Resistance Training Performed Yes    VAD Patient? No    PAD/SET Patient? No      Pain Assessment   Currently in Pain? No/denies    Multiple Pain Sites No             Social History   Tobacco Use  Smoking Status Former   Current packs/day: 0.00   Types: Cigarettes   Quit date: 10/12/2007   Years since quitting: 16.3  Smokeless Tobacco Never    Goals Met:  Independence with exercise equipment Exercise tolerated well No report of concerns or symptoms today  Goals Unmet:  Not Applicable  Comments: Pt able to follow exercise prescription today without complaint.  Will continue to monitor for progression.    Dr. Oneil Pinal is Medical Director for [facility] Cardiac Rehabilitation.  Dr. Fuad Aleskerov is Medical Director for [facility] Pulmonary Rehabilitation.

## 2024-02-29 NOTE — Telephone Encounter (Signed)
 Oral Oncology Patient Advocate Encounter  Was successful in securing patient a $7500 grant from [facility] to provide copayment coverage for Kisqali .  This will keep the out of pocket expense at $0.     [employer] ID: [id number]   The billing information is as follows and has been shared with [facility].    RxBin: [id number] PCN: [id number] Member ID: [id number] Group ID: [id number] Dates of Eligibility: 04/01/2024 through 03/31/2025  Fund:  Breast Cancer - Medicare Access  [place] [name], CPhT  [facility], Zelda Salmon, [facility] - Oral Chemotherapy Patient Advocate Specialist III Phone: [phone]  Fax: [phone]

## 2024-02-29 NOTE — Progress Notes (Signed)
 Specialty Pharmacy Refill Coordination Note  Sabrina Wilson is a 83 y.o. female contacted today regarding refills of specialty medication(s) Ribociclib  Succinate (KISQALI  400mg  Daily Dose)   Patient requested Delivery   Delivery date: 03/06/24   Verified address: [address]  [place]   Medication will be filled on: 03/05/24

## 2024-03-01 ENCOUNTER — Other Ambulatory Visit (HOSPITAL_COMMUNITY): Payer: Self-pay

## 2024-03-05 ENCOUNTER — Encounter

## 2024-03-05 ENCOUNTER — Other Ambulatory Visit: Payer: Self-pay

## 2024-03-05 DIAGNOSIS — C50811 Malignant neoplasm of overlapping sites of right female breast: Secondary | ICD-10-CM

## 2024-03-05 NOTE — Progress Notes (Signed)
 Daily Session Note  Patient Details  Name: Sabrina Wilson MRN: [id number] Date of Birth: 09-11-1940 Referring Provider:   [facility] Rehabilitation & Exercise from 01/16/2024 in [facility] Cardiac and Pulmonary Rehab  Referring Provider Rennie Lighter, MD    Encounter Date: 03/05/2024  Check In:  Session Check In - 03/05/24 1225       Check-In   Supervising physician immediately available to respond to emergencies See telemetry face sheet for immediately available ER MD    Location [facility]-Cardiac & Pulmonary Rehab    Staff Present Rollene Paterson, MS, Exercise Physiologist    Virtual Visit No    Medication changes reported     No    Fall or balance concerns reported    No    Warm-up and Cool-down Performed on first and last piece of equipment    Resistance Training Performed Yes    VAD Patient? No    PAD/SET Patient? No      Pain Assessment   Currently in Pain? No/denies    Multiple Pain Sites No             Social History   Tobacco Use  Smoking Status Former   Current packs/day: 0.00   Types: Cigarettes   Quit date: 10/12/2007   Years since quitting: 16.4  Smokeless Tobacco Never    Goals Met:  Independence with exercise equipment Exercise tolerated well No report of concerns or symptoms today  Goals Unmet:  Not Applicable  Comments: Pt able to follow exercise prescription today without complaint.  Will continue to monitor for progression.    Dr. Oneil Pinal is Medical Director for [facility] Cardiac Rehabilitation.  Dr. Fuad Aleskerov is Medical Director for [facility] Pulmonary Rehabilitation.

## 2024-03-07 ENCOUNTER — Encounter

## 2024-03-07 DIAGNOSIS — C50811 Malignant neoplasm of overlapping sites of right female breast: Secondary | ICD-10-CM

## 2024-03-07 NOTE — Progress Notes (Signed)
 Daily Session Note  Patient Details  Name: Sabrina Wilson MRN: [id number] Date of Birth: June 22, 1940 Referring Provider:   [facility]rad [facility] Associated Rehabilitation & Exercise from 01/16/2024 in [facility] Cardiac and Pulmonary Rehab  Referring Provider Rennie Lighter, MD    Encounter Date: 03/07/2024  Check In:  Session Check In - 03/07/24 1225       Check-In   Supervising physician immediately available to respond to emergencies See telemetry face sheet for immediately available ER MD    Location [facility]-Cardiac & Pulmonary Rehab    Staff Present Rollene Paterson, MS, Exercise Physiologist    Virtual Visit No    Medication changes reported     No    Fall or balance concerns reported    No    Warm-up and Cool-down Performed on first and last piece of equipment    Resistance Training Performed Yes    VAD Patient? No    PAD/SET Patient? No      Pain Assessment   Currently in Pain? No/denies    Multiple Pain Sites No             Social History   Tobacco Use  Smoking Status Former   Current packs/day: 0.00   Types: Cigarettes   Quit date: 10/12/2007   Years since quitting: 16.4  Smokeless Tobacco Never    Goals Met:  Independence with exercise equipment Exercise tolerated well No report of concerns or symptoms today  Goals Unmet:  Not Applicable  Comments: Pt able to follow exercise prescription today without complaint.  Will continue to monitor for progression.    Dr. Oneil Pinal is Medical Director for [facility] Cardiac Rehabilitation.  Dr. Fuad Aleskerov is Medical Director for [facility] Pulmonary Rehabilitation.

## 2024-03-12 ENCOUNTER — Encounter

## 2024-03-12 DIAGNOSIS — Z170 Estrogen receptor positive status [ER+]: Secondary | ICD-10-CM

## 2024-03-12 NOTE — Progress Notes (Signed)
 Daily Session Note  Patient Details  Name: Sabrina Wilson MRN: [id number] Date of Birth: 04-May-1940 Referring Provider:   [facility]rad [facility]s [facility] Associated Rehabilitation & Exercise from 01/16/2024 in [facility] Cardiac and Pulmonary Rehab  Referring Provider Rennie Lighter, MD    Encounter Date: 03/12/2024  Check In:  Session Check In - 03/12/24 1225       Check-In   Supervising physician immediately available to respond to emergencies See telemetry face sheet for immediately available ER MD    Location [facility]-Cardiac & Pulmonary Rehab    Staff Present Rollene Paterson, MS, Exercise Physiologist;Roxan Yamamoto, BS, Exercise Physiologist    Virtual Visit No    Medication changes reported     No    Fall or balance concerns reported    No    Warm-up and Cool-down Performed on first and last piece of equipment    Resistance Training Performed Yes    VAD Patient? No    PAD/SET Patient? No      Pain Assessment   Currently in Pain? No/denies    Multiple Pain Sites No             Social History   Tobacco Use  Smoking Status Former   Current packs/day: 0.00   Types: Cigarettes   Quit date: 10/12/2007   Years since quitting: 16.4  Smokeless Tobacco Never    Goals Met:  Independence with exercise equipment Exercise tolerated well No report of concerns or symptoms today  Goals Unmet:  Not Applicable  Comments: Pt able to follow exercise prescription today without complaint.  Will continue to monitor for progression.    Dr. Oneil Pinal is Medical Director for [facility] Cardiac Rehabilitation.  Dr. Fuad Aleskerov is Medical Director for [facility] Pulmonary Rehabilitation.

## 2024-03-14 ENCOUNTER — Encounter

## 2024-03-14 DIAGNOSIS — Z170 Estrogen receptor positive status [ER+]: Secondary | ICD-10-CM

## 2024-03-14 NOTE — Progress Notes (Signed)
 Daily Session Note  Patient Details  Name: Sabrina Wilson MRN: [id number] Date of Birth: 1940-06-22 Referring Provider:   Conrad Ports [facility] Associated Rehabilitation & Exercise from 01/16/2024 in [facility] Cardiac and Pulmonary Rehab  Referring Provider Rennie Lighter, MD    Encounter Date: 03/14/2024  Check In:  Session Check In - 03/14/24 1341       Check-In   Supervising physician immediately available to respond to emergencies See telemetry face sheet for immediately available ER MD    Location [facility]-Cardiac & Pulmonary Rehab    Staff Present Rollene Paterson, MS, Exercise Physiologist    Virtual Visit No    Medication changes reported     No    Fall or balance concerns reported    No    Warm-up and Cool-down Performed on first and last piece of equipment    Resistance Training Performed Yes    VAD Patient? No    PAD/SET Patient? No      Pain Assessment   Currently in Pain? No/denies    Multiple Pain Sites No             Social History   Tobacco Use  Smoking Status Former   Current packs/day: 0.00   Types: Cigarettes   Quit date: 10/12/2007   Years since quitting: 16.4  Smokeless Tobacco Never    Goals Met:  Independence with exercise equipment Exercise tolerated well No report of concerns or symptoms today  Goals Unmet:  Not Applicable  Comments: Pt able to follow exercise prescription today without complaint.  Will continue to monitor for progression.    Dr. Oneil Pinal is Medical Director for [facility] Cardiac Rehabilitation.  Dr. Fuad Aleskerov is Medical Director for [facility] Pulmonary Rehabilitation.

## 2024-03-19 ENCOUNTER — Encounter

## 2024-03-19 ENCOUNTER — Encounter: Payer: Self-pay | Admitting: Internal Medicine

## 2024-03-26 ENCOUNTER — Other Ambulatory Visit: Payer: Self-pay

## 2024-03-26 ENCOUNTER — Encounter

## 2024-03-26 DIAGNOSIS — C50811 Malignant neoplasm of overlapping sites of right female breast: Secondary | ICD-10-CM | POA: Insufficient documentation

## 2024-03-26 DIAGNOSIS — Z170 Estrogen receptor positive status [ER+]: Secondary | ICD-10-CM | POA: Insufficient documentation

## 2024-03-26 NOTE — Progress Notes (Signed)
 Daily Session Note  Patient Details  Name: Sabrina Wilson MRN: [id number] Date of Birth: 1940/05/31 Referring Provider:   Conrad Ports Cancer Associated Rehabilitation & Exercise from 01/16/2024 in [facility] Cardiac and Pulmonary Rehab  Referring Provider Rennie Lighter, MD    Encounter Date: 03/26/2024  Check In:  Session Check In - 03/26/24 1230       Check-In   Supervising physician immediately available to respond to emergencies See telemetry face sheet for immediately available ER MD    Location [facility]-Cardiac & Pulmonary Rehab    Staff Present Rollene Paterson, MS, Exercise Physiologist    Virtual Visit No    Medication changes reported     No    Fall or balance concerns reported    No    Warm-up and Cool-down Performed on first and last piece of equipment    Resistance Training Performed Yes    VAD Patient? No    PAD/SET Patient? No      Pain Assessment   Currently in Pain? No/denies    Multiple Pain Sites No             Social History   Tobacco Use  Smoking Status Former   Current packs/day: 0.00   Types: Cigarettes   Quit date: 10/12/2007   Years since quitting: 16.4  Smokeless Tobacco Never    Goals Met:  Independence with exercise equipment Exercise tolerated well No report of concerns or symptoms today  Goals Unmet:  Not Applicable  Comments: Pt able to follow exercise prescription today without complaint.  Will continue to monitor for progression.    Dr. Oneil Pinal is Medical Director for [facility] Cardiac Rehabilitation.  Dr. Fuad Aleskerov is Medical Director for [facility] Pulmonary Rehabilitation.

## 2024-03-28 ENCOUNTER — Other Ambulatory Visit: Payer: Self-pay

## 2024-03-28 ENCOUNTER — Encounter

## 2024-03-28 DIAGNOSIS — C50811 Malignant neoplasm of overlapping sites of right female breast: Secondary | ICD-10-CM

## 2024-03-28 NOTE — Progress Notes (Signed)
 Daily Session Note  Patient Details  Name: Sabrina Wilson MRN: [id number] Date of Birth: 10-24-1940 Referring Provider:   Conrad Ports [facility] Associated Rehabilitation & Exercise from 01/16/2024 in [facility] Cardiac and Pulmonary Rehab  Referring Provider Rennie Lighter, MD    Encounter Date: 03/28/2024  Check In:  Session Check In - 03/28/24 1230       Check-In   Supervising physician immediately available to respond to emergencies See telemetry face sheet for immediately available ER MD    Location [facility]-Cardiac & Pulmonary Rehab    Staff Present Draven Natter BS, Exercise Physiologist    Virtual Visit No    Medication changes reported     No    Fall or balance concerns reported    No    Warm-up and Cool-down Performed on first and last piece of equipment    Resistance Training Performed Yes    VAD Patient? No             Social History   Tobacco Use  Smoking Status Former   Current packs/day: 0.00   Types: Cigarettes   Quit date: 10/12/2007   Years since quitting: 16.4  Smokeless Tobacco Never    Goals Met:  Independence with exercise equipment Exercise tolerated well No report of concerns or symptoms today  Goals Unmet:  Not Applicable  Comments: Pt able to follow exercise prescription today without complaint.  Will continue to monitor for progression.    Dr. Oneil Pinal is Medical Director for [facility] Cardiac Rehabilitation.  Dr. Fuad Aleskerov is Medical Director for [facility] Pulmonary Rehabilitation.

## 2024-03-28 NOTE — Progress Notes (Signed)
 Specialty Pharmacy Refill Coordination Note  Sabrina Wilson is a 83 y.o. female contacted today regarding refills of specialty medication(s) Ribociclib  Succinate (KISQALI  400mg  Daily Dose)   Patient requested Delivery   Delivery date: 04/09/24   Verified address: [address]  [place]   Medication will be filled on: 04/09/24

## 2024-03-29 ENCOUNTER — Other Ambulatory Visit: Payer: Self-pay

## 2024-03-29 ENCOUNTER — Other Ambulatory Visit (HOSPITAL_COMMUNITY): Payer: Self-pay

## 2024-03-29 NOTE — Progress Notes (Signed)
 Patient called back, she had miscounted her pills and is finishing this week then off next week. Updated delivery date to 12/10.

## 2024-03-30 ENCOUNTER — Other Ambulatory Visit: Payer: Self-pay | Admitting: Internal Medicine

## 2024-04-01 ENCOUNTER — Encounter: Payer: Self-pay | Admitting: Internal Medicine

## 2024-04-02 ENCOUNTER — Encounter

## 2024-04-02 ENCOUNTER — Other Ambulatory Visit: Payer: Self-pay

## 2024-04-02 DIAGNOSIS — C50811 Malignant neoplasm of overlapping sites of right female breast: Secondary | ICD-10-CM

## 2024-04-02 NOTE — Progress Notes (Signed)
 Daily Session Note  Patient Details  Name: Sabrina Wilson MRN: [id number] Date of Birth: [birth year] Referring Provider:   Conrad Ports Cancer Associated Rehabilitation & Exercise from 01/16/2024 in [facility] Cardiac and Pulmonary Rehab  Referring Provider Rennie Lighter, MD    Encounter Date: 04/02/2024  Check In:  Session Check In - 04/02/24 1225       Check-In   Supervising physician immediately available to respond to emergencies See telemetry face sheet for immediately available ER MD    Location [facility]-Cardiac & Pulmonary Rehab    Staff Present Rollene Paterson, MS, Exercise Physiologist    Virtual Visit No    Medication changes reported     No    Fall or balance concerns reported    No    Warm-up and Cool-down Performed on first and last piece of equipment    Resistance Training Performed Yes    VAD Patient? No    PAD/SET Patient? No      Pain Assessment   Currently in Pain? No/denies    Multiple Pain Sites No             Social History   Tobacco Use  Smoking Status Former   Current packs/day: 0.00   Types: Cigarettes   Quit date: 10/12/2007   Years since quitting: 16.4  Smokeless Tobacco Never    Goals Met:  Independence with exercise equipment Exercise tolerated well No report of concerns or symptoms today  Goals Unmet:  Not Applicable  Comments: Pt able to follow exercise prescription today without complaint.  Will continue to monitor for progression.    Dr. Oneil Pinal is Medical Director for [facility] Cardiac Rehabilitation.  Dr. Fuad Aleskerov is Medical Director for [facility] Pulmonary Rehabilitation.

## 2024-04-04 ENCOUNTER — Encounter

## 2024-04-08 ENCOUNTER — Inpatient Hospital Stay
Admission: RE | Admit: 2024-04-08 | Discharge: 2024-04-08 | Disposition: A | Source: Ambulatory Visit | Attending: Internal Medicine

## 2024-04-08 DIAGNOSIS — Z1231 Encounter for screening mammogram for malignant neoplasm of breast: Secondary | ICD-10-CM | POA: Diagnosis not present

## 2024-04-08 DIAGNOSIS — R92323 Mammographic fibroglandular density, bilateral breasts: Secondary | ICD-10-CM | POA: Diagnosis not present

## 2024-04-08 DIAGNOSIS — R921 Mammographic calcification found on diagnostic imaging of breast: Secondary | ICD-10-CM | POA: Diagnosis not present

## 2024-04-08 DIAGNOSIS — Z853 Personal history of malignant neoplasm of breast: Secondary | ICD-10-CM

## 2024-04-09 ENCOUNTER — Other Ambulatory Visit: Payer: Self-pay | Admitting: *Deleted

## 2024-04-09 ENCOUNTER — Encounter

## 2024-04-09 DIAGNOSIS — C50811 Malignant neoplasm of overlapping sites of right female breast: Secondary | ICD-10-CM

## 2024-04-09 DIAGNOSIS — Z7969 Long term (current) use of other immunomodulators and immunosuppressants: Secondary | ICD-10-CM

## 2024-04-09 NOTE — Progress Notes (Signed)
 Daily Session Note  Patient Details  Name: Sabrina Wilson MRN: [id number] Date of Birth: December 22, 1940 Referring Provider:   Conrad Ports Cancer Associated Rehabilitation & Exercise from 01/16/2024 in [facility] Cardiac and Pulmonary Rehab  Referring Provider Rennie Lighter, MD    Encounter Date: 04/09/2024  Check In:  Session Check In - 04/09/24 1228       Check-In   Supervising physician immediately available to respond to emergencies See telemetry face sheet for immediately available ER MD    Location [facility]-Cardiac & Pulmonary Rehab    Staff Present Rollene Paterson, MS, Exercise Physiologist    Virtual Visit No    Medication changes reported     No    Fall or balance concerns reported    No    Warm-up and Cool-down Performed on first and last piece of equipment    Resistance Training Performed Yes    VAD Patient? No    PAD/SET Patient? No      Pain Assessment   Currently in Pain? No/denies    Multiple Pain Sites No             Tobacco Use History[1]  Goals Met:  Independence with exercise equipment Exercise tolerated well No report of concerns or symptoms today  Goals Unmet:  Not Applicable  Comments: Pt able to follow exercise prescription today without complaint.  Will continue to monitor for progression.    Dr. Oneil Pinal is Medical Director for [facility] Cardiac Rehabilitation.  Dr. Fuad Aleskerov is Medical Director for [facility] Pulmonary Rehabilitation.    [1]  Social History Tobacco Use  Smoking Status Former   Current packs/day: 0.00   Types: Cigarettes   Quit date: 10/12/2007   Years since quitting: 16.5  Smokeless Tobacco Never

## 2024-04-10 ENCOUNTER — Inpatient Hospital Stay: Admitting: Internal Medicine

## 2024-04-10 ENCOUNTER — Encounter: Payer: Self-pay | Admitting: Internal Medicine

## 2024-04-10 ENCOUNTER — Inpatient Hospital Stay: Attending: Internal Medicine

## 2024-04-10 VITALS — BP 164/81 | HR 75 | Temp 96.7°F | Resp 18 | Ht 66.9 in | Wt 175.6 lb

## 2024-04-10 DIAGNOSIS — C50811 Malignant neoplasm of overlapping sites of right female breast: Secondary | ICD-10-CM | POA: Diagnosis not present

## 2024-04-10 DIAGNOSIS — Z170 Estrogen receptor positive status [ER+]: Secondary | ICD-10-CM | POA: Diagnosis not present

## 2024-04-10 DIAGNOSIS — M8580 Other specified disorders of bone density and structure, unspecified site: Secondary | ICD-10-CM | POA: Diagnosis not present

## 2024-04-10 DIAGNOSIS — Z79899 Other long term (current) drug therapy: Secondary | ICD-10-CM | POA: Diagnosis not present

## 2024-04-10 DIAGNOSIS — Z7969 Long term (current) use of other immunomodulators and immunosuppressants: Secondary | ICD-10-CM

## 2024-04-10 LAB — CBC WITH DIFFERENTIAL (CANCER CENTER ONLY)
Abs Immature Granulocytes: 0.02 K/uL (ref 0.00–0.07)
Basophils Absolute: 0.1 K/uL (ref 0.0–0.1)
Basophils Relative: 2 %
Eosinophils Absolute: 0.1 K/uL (ref 0.0–0.5)
Eosinophils Relative: 2 %
HCT: 31.4 % — ABNORMAL LOW (ref 36.0–46.0)
Hemoglobin: 10.5 g/dL — ABNORMAL LOW (ref 12.0–15.0)
Immature Granulocytes: 1 %
Lymphocytes Relative: 21 %
Lymphs Abs: 0.8 K/uL (ref 0.7–4.0)
MCH: 37.2 pg — ABNORMAL HIGH (ref 26.0–34.0)
MCHC: 33.4 g/dL (ref 30.0–36.0)
MCV: 111.3 fL — ABNORMAL HIGH (ref 80.0–100.0)
Monocytes Absolute: 0.6 K/uL (ref 0.1–1.0)
Monocytes Relative: 16 %
Neutro Abs: 2.4 K/uL (ref 1.7–7.7)
Neutrophils Relative %: 58 %
Platelet Count: 173 K/uL (ref 150–400)
RBC: 2.82 MIL/uL — ABNORMAL LOW (ref 3.87–5.11)
RDW: 17.8 % — ABNORMAL HIGH (ref 11.5–15.5)
WBC Count: 4 K/uL (ref 4.0–10.5)
nRBC: 0 % (ref 0.0–0.2)

## 2024-04-10 LAB — CMP (CANCER CENTER ONLY)
ALT: 16 U/L (ref 0–44)
AST: 23 U/L (ref 15–41)
Albumin: 4.2 g/dL (ref 3.5–5.0)
Alkaline Phosphatase: 61 U/L (ref 38–126)
Anion gap: 12 (ref 5–15)
BUN: 19 mg/dL (ref 8–23)
CO2: 24 mmol/L (ref 22–32)
Calcium: 9.5 mg/dL (ref 8.9–10.3)
Chloride: 102 mmol/L (ref 98–111)
Creatinine: 1.08 mg/dL — ABNORMAL HIGH (ref 0.44–1.00)
GFR, Estimated: 51 mL/min — ABNORMAL LOW (ref >60)
Glucose, Bld: 111 mg/dL — ABNORMAL HIGH (ref 70–99)
Potassium: 4.4 mmol/L (ref 3.5–5.1)
Sodium: 138 mmol/L (ref 135–145)
Total Bilirubin: 0.4 mg/dL (ref 0.0–1.2)
Total Protein: 6.7 g/dL (ref 6.5–8.1)

## 2024-04-10 LAB — MAGNESIUM: Magnesium: 1.9 mg/dL (ref 1.7–2.4)

## 2024-04-10 NOTE — Progress Notes (Signed)
 [facility] CONSULT NOTE  Patient Care Team: Bair, Kalpana, MD as PCP - General (Family Medicine) [name], MD as PCP - Cardiology (Cardiology) Dessa, [name], MD as Consulting Physician (General Surgery) [name], MD (Internal Medicine) [name], RN as Oncology Nurse Navigator [name], MD as Consulting Physician (Radiation Oncology) [name], MD as Consulting Physician (Oncology) [name], DO as Consulting Physician (General Surgery)  CHIEF COMPLAINTS/PURPOSE OF CONSULTATION: Breast cancer  #  Oncology History Overview Note  # 2009- Sigmoid colon cancer stage II (T3 N0 4 lymph nodes sampled M0 stage II high risk there was obstruction and perforation abscess; Dr.Hern), workup included a low CEA preoperatively and a chest x-ray and CT scan abdomen pelvis negative for metastatic disease, S/P colostomy; s/p FOLFOX x 6 months. [Dr.Gittin ]  # LATER REVERSED , K RAS  wild type B RAF not evaluated   Also initial iron deficiency with anemia thrombocytosis, high platelets considered reactive, workup includes a normal PCR for CML and a negative [id number] mutation  DIAGNOSIS:  A. BREAST, RIGHT; LUMPECTOMY:  - INVASIVE MAMMARY CARCINOMA.  - DUCTAL CARCINOMA IN SITU (DCIS).  - SEE CANCER SUMMARY BELOW.  - BIOPSY SITE CHANGE WITH CLIP (2).  - FIBROUS TISSUE WITH CYST FORMATION AND FLORID DUCTAL HYPERPLASIA.  - SKIN AND NIPPLE NEGATIVE FOR MALIGNANCY.  - VASCULAR CALCIFICATION.   B. SENTINEL LYMPH NODES 1 AND 2, RIGHT AXILLA; EXCISION:  - METASTATIC CARCINOMA INVOLVES ONE OF TWO LYMPH NODES (1/2).   C. NON-SENTINEL LYMPH NODES, RIGHT AXILLA; EXCISION:  - ONE LYMPH NODE NEGATIVE FOR MALIGNANCY (0/1).   CANCER CASE SUMMARY: INVASIVE CARCINOMA OF THE BREAST  Standard(s): AJCC-UICC 8   SPECIMEN  Procedure: Lumpectomy  Specimen Laterality: Right   TUMOR  Histologic Type: Invasive carcinoma of no special type (ductal)   Histologic Grade (Nottingham Histologic Score)       Glandular (Acinar)/Tubular Differentiation: 3       Nuclear Pleomorphism: 3       Mitotic Rate: 3       Overall Grade: 3  Tumor Size: 24 mm  Tumor Focality: Single focus of invasive carcinoma  Ductal Carcinoma In Situ (DCIS): Present, nuclear grade 2-3  Tumor Extent: Not applicable  Lymphatic and/or Vascular Invasion: Present  Treatment Effect in the Breast: No known presurgical therapy   MARGINS  Margin Status for Invasive Carcinoma: All margins negative for invasive  carcinoma       Distance from closest margin: 10 mm       Specify closest margin: Superior   Margin Status for DCIS: All margins negative for DCIS       Distance from DCIS to closest margin: 10 mm       Specify closest margin: Superior   REGIONAL LYMPH NODES  Regional Lymph Node Status: Tumor present in regional lymph node(s)       Number of Lymph Nodes with Macrometastases (greater than 2 mm): 1       Number of Lymph Nodes with Micrometastases (greater than 0.2 mm to  2 mm and/or greater than 200 cells): 0       Number of Lymph Nodes with Isolated Tumor Cells (0.2 mm or less OR  200 cells or less): 0       Size of Largest Metastatic Deposit: 3 mm      Extranodal Extension: Not identified       Total Number of Lymph Nodes Examined (sentinel and non-sentinel): 3  Number of Sentinel Nodes Examined: 2   DISTANT METASTASIS  Distant Site(s) Involved, if applicable: Not applicable   PATHOLOGIC STAGE CLASSIFICATION (pTNM, AJCC 8th Edition):  Modified Classification: Not applicable  pT Category: pT2  T Suffix: Not applicable  pN Category: pN1a  N Suffix: (sn)  pM Category: Not applicable   SPECIAL STUDIES  Breast Biomarker Testing Performed on Previous Outside Biopsy:  23-250-T11  Per outside report:  Estrogen Receptor (ER) Status: POSITIVE          Percentage of cells with nuclear positivity: Greater than 90%          Average intensity of staining:  Strong   Progesterone Receptor (PgR) Status: POSITIVE          Percentage of cells with nuclear positivity: 60%          Average intensity of staining: Strong   HER2 (by immunohistochemistry): EQUIVOCAL (Score 2+)  HER2 FISH: NEGATIVE   Ki-67: Not performed   # Cytoxan  Taxotere  x 4 cycles; finished JAN, 2024. [Neutropenic fevers s/p cycle #4;   -FEB 2nd, 2024-right lung PE; left lower LE  DVT- on eliquis .   # FEB 19th-start RT; mpectomy radiation April 3rd 2024.  # anastrozole  once a day [April 2024]-t # Poor tolerance to adjuvant abemaciclib  100 mg BID [started second week of June] stopped x 1 month.    # JAN 8th 2024- start abemaciclib  50 mg BID- STOPPED in AUG 2025- sec to intolerance.   # LATE SEP 2025- start RIBOCICLIB  400 mg 3 w- on and 1 week OFF      Cancer of sigmoid (HCC) (Resolved)  Carcinoma of overlapping sites of right breast in female, estrogen receptor positive (HCC)  02/14/2022 Initial Diagnosis   Carcinoma of overlapping sites of right breast in female, estrogen receptor positive (HCC)   02/14/2022 Cancer Staging   Staging form: Breast, AJCC 8th Edition - Pathologic: Stage IIA (pT2, pN1, cM0, G3, ER+, PR+, HER2-) - Signed by [name], MD on 02/14/2022 Histologic grading system: 3 grade system   02/23/2022 -  Chemotherapy   Patient is on Treatment Plan : BREAST TC q21d      HISTORY OF PRESENTING ILLNESS:   Alone.  Ambulating independently.   Erminio Shawnee Maffucci 83 y.o.  female patient with ER/PR positive Her 2 Negative  breast cancer stage II T2 N1-currently  adjuvant anastrozole -and kisqali    is here for a follow up.   Discussed the use of AI scribe software for clinical note transcription with the patient, who gave verbal consent to proceed.  History of Present Illness   Leroy Trim is an 83 year old female with ER-positive right breast carcinoma on CDK inhibitor and anastrozole  who presents for routine oncology follow-up.  She is  receiving ongoing treatment with Kisqali  and anastrozole  for ER-positive right breast carcinoma, having started her most recent cycle of Kisqali  earlier this week. She has been on this regimen for several months. She receives infusions every six months, with the last in July and the next scheduled for January. A recent mammogram was unremarkable. She has no new symptoms or concerns related to her malignancy or its treatment.  Her hemoglobin is 10.5 g/dL, which has been in this range for some time. She is aware of her stage 3 chronic kidney disease, with prior creatinine values in the 40 range, and is awaiting current laboratory results.  She participates in a twice-weekly exercise program, which she feels has improved her leg strength. She plans to  resume after a brief holiday break, with three sessions remaining. She reports improvement in overall well-being after discontinuing a previous medication. She reported that the blood pressure reading in clinic was higher than usual and attributed this to discomfort from the blood pressure cuff.      Review of Systems  Constitutional:  Positive for malaise/fatigue. Negative for chills, diaphoresis and fever.  HENT:  Negative for nosebleeds and sore throat.   Eyes:  Negative for double vision.  Respiratory:  Negative for hemoptysis, sputum production and wheezing.   Cardiovascular:  Negative for chest pain, palpitations, orthopnea and leg swelling.  Gastrointestinal:  Negative for blood in stool, constipation, diarrhea, heartburn, melena and vomiting.  Genitourinary:  Negative for dysuria, frequency and urgency.  Musculoskeletal:  Positive for back pain and joint pain.  Skin: Negative.  Negative for itching and rash.  Neurological:  Negative for dizziness, tingling, focal weakness, weakness and headaches.  Endo/Heme/Allergies:  Does not bruise/bleed easily.  Psychiatric/Behavioral:  Negative for depression. The patient is not nervous/anxious and does not  have insomnia.      MEDICAL HISTORY:  Past Medical History:  Diagnosis Date   Actinic keratosis 02/12/2020   R lat calf (hypertrophic)   Arthritis    Cancer of sigmoid (HCC) 06/17/2016   Colon cancer (HCC) 2009   T3,N0; s/p resection  Dr. Primus and chemotherapy by Dr. Brooks   Diastolic dysfunction    a. 08/2020 Echo: EF 60-65%, no rwma, Gr1 DD, nl RV size/fxn. Mild MR.   DVT (deep venous thrombosis) (HCC) 05/29/2022   Hernia of flank    History of actinic keratoses 08/11/2020   Bx proven at left lateral ankle proximal, LN2 10/08/20   History of stress test    a. 09/2020 MV: EF >65%. No ischemia/infarct. Mild Ao Ca2+ and minimal Cor Ca2+.   Hypertension    Hypomagnesemia 05/29/2022   Hyponatremia 05/29/2022   Neuropathy    Personal history of radiation therapy 2023   right breast CA   Polyp at cervical os    s/p resection Dr. Cleotilde   Pulmonary embolism (HCC) 05/27/2022   Skin cancer 01/22/2020   surface of an atypical verrucous squamous proliferation    Squamous cell carcinoma of skin 07/16/2020   Left lateral ankle anterior, [facility] with [facility] 08-11-2020    SURGICAL HISTORY: Past Surgical History:  Procedure Laterality Date   BREAST BIOPSY Right 05/2014   Dr. Fredirick office-benign   BREAST LUMPECTOMY WITH SENTINEL LYMPH NODE BIOPSY Right 01/21/2022   Procedure: BREAST LUMPECTOMY WITH SENTINEL LYMPH NODE BX;  Surgeon: Dessa [name], MD;  Location: [facility] ORS;  Service: General;  Laterality: Right;   BREAST SURGERY Right February 2016   Vacuum assisted biopsy for recurrent cyst, fibrocystic changes without evidence of malignancy.   CATARACT EXTRACTION W/PHACO Left 01/13/2015   Procedure: CATARACT EXTRACTION PHACO AND INTRAOCULAR LENS PLACEMENT (IOC);  Surgeon: Elsie Carmine, MD;  Location: [facility] ORS;  Service: Ophthalmology;  Laterality: Left;  US   1:02.9 AP   19.2 CDE  12.06 casette lot #  [id number]   CATARACT EXTRACTION W/PHACO Right 02/03/2015   Procedure: CATARACT  EXTRACTION PHACO AND INTRAOCULAR LENS PLACEMENT (IOC);  Surgeon: Elsie Carmine, MD;  Location: [facility] ORS;  Service: Ophthalmology;  Laterality: Right;  [id number]:53 ap46.5 [id number]   COLECTOMY     COLONOSCOPY  2015   Dr Ora   COLONOSCOPY WITH PROPOFOL  N/A 09/13/2017   Procedure: COLONOSCOPY WITH PROPOFOL ;  Surgeon: Dessa [name], MD;  Location: [facility] ENDOSCOPY;  Service: Endoscopy;  Laterality: N/A;   COLONOSCOPY WITH PROPOFOL  N/A 11/08/2017   Procedure: COLONOSCOPY WITH PROPOFOL ;  Surgeon: Dessa [name], MD;  Location: [facility] ENDOSCOPY;  Service: Endoscopy;  Laterality: N/A;   colostomy reversal     DILATION AND CURETTAGE OF UTERUS  2014   Dr. Cleotilde, benign per pt   EYE SURGERY     HERNIA REPAIR  07/13/12   ventral    SKIN CANCER EXCISION     TONSILLECTOMY      SOCIAL HISTORY: Social History   Socioeconomic History   Marital status: Married    Spouse name: Not on file   Number of children: Not on file   Years of education: Not on file   Highest education level: 12th grade  Occupational History   Not on file  Tobacco Use   Smoking status: Former    Current packs/day: 0.00    Types: Cigarettes    Quit date: 10/12/2007    Years since quitting: 16.5   Smokeless tobacco: Never  Vaping Use   Vaping status: Never Used  Substance and Sexual Activity   Alcohol use: Yes    Alcohol/week: 1.0 standard drink of alcohol    Types: 1 Standard drinks or equivalent per week    Comment: with dinner GLASS OF WINE EACH DAY   Drug use: No   Sexual activity: Not on file  Other Topics Concern   Not on file  Social History Narrative   Lives in [place] with husband. 2 children, son lives nearby.Diet - regularExercise - none. 30 mins/in [place]. Quit smoking in 2009. Wine before dinner. Pianist in church; real estate; diplomatic services operational officer.    Social Drivers of Health   Tobacco Use: Medium Risk (04/10/2024)   Patient History    Smoking Tobacco Use: Former    Smokeless Tobacco Use: Never     Passive Exposure: Not on file  Financial Resource Strain: Low Risk (11/23/2023)   Overall Financial Resource Strain (CARDIA)    Difficulty of Paying Living Expenses: Not hard at all  Food Insecurity: No Food Insecurity (11/23/2023)   Epic    Worried About Programme Researcher, Broadcasting/film/video in the Last Year: Never true    Ran Out of Food in the Last Year: Never true  Transportation Needs: No Transportation Needs (11/23/2023)   Epic    Lack of Transportation (Medical): No    Lack of Transportation (Non-Medical): No  Physical Activity: Insufficiently Active (11/23/2023)   Exercise Vital Sign    Days of Exercise per Week: 1 day    Minutes of Exercise per Session: 30 min  Stress: No Stress Concern Present (11/23/2023)   [employer] of Occupational Health - Occupational Stress Questionnaire    Feeling of Stress: Not at all  Social Connections: Socially Integrated (11/23/2023)   Social Connection and Isolation Panel    Frequency of Communication with Friends and Family: More than three times a week    Frequency of Social Gatherings with Friends and Family: Three times a week    Attends Religious Services: More than 4 times per year    Active Member of Clubs or Organizations: Yes    Attends Banker Meetings: More than 4 times per year    Marital Status: Married  Catering Manager Violence: Not At Risk (11/27/2023)   Epic    Fear of Current or Ex-Partner: No    Emotionally Abused: No    Physically Abused: No    Sexually Abused: No  Depression (PHQ2-9):  Low Risk (04/10/2024)   Depression (PHQ2-9)    PHQ-2 Score: 0  Alcohol Screen: Low Risk (11/23/2023)   Alcohol Screen    Last Alcohol Screening Score (AUDIT): 3  Housing: Unknown (11/23/2023)   Epic    Unable to Pay for Housing in the Last Year: No    Number of Times Moved in the Last Year: Not on file    Homeless in the Last Year: No  Utilities: Not At Risk (11/27/2023)   Epic    Threatened with loss of utilities: No  Health Literacy:  Adequate Health Literacy (11/27/2023)   B1300 Health Literacy    Frequency of need for help with medical instructions: Never    FAMILY HISTORY: Family History  Problem Relation Age of Onset   Stroke Mother    Diabetes Mother    Colon cancer Father        dx 19s   Stroke Sister    Colon cancer Brother        dx 51s-70s   Brain cancer Brother        dx 60s-70s   Breast cancer Neg Hx     ALLERGIES:  is allergic to sulfa antibiotics.  MEDICATIONS:  Current Outpatient Medications  Medication Sig Dispense Refill   anastrozole  (ARIMIDEX ) 1 MG tablet TAKE 1 TABLET BY MOUTH DAILY 90 tablet 0   atorvastatin  (LIPITOR) 10 MG tablet Take 1 tablet (10 mg total) by mouth daily. 90 tablet 3   budesonide-glycopyrrolate -formoterol (BREZTRI  AEROSPHERE) 160-9-4.8 MCG/ACT AERO inhaler Inhale 2 puffs into the lungs in the morning and at bedtime. 1 each 11   furosemide  (LASIX ) 20 MG tablet TAKE 1 TABLET BY MOUTH EVERY OTHER DAY 30 tablet 1   halobetasol  (ULTRAVATE ) 0.05 % cream Apply topically to aa's of rash at body twice daily on weekends only as needed. Avoid applying to face, groin, and axilla. Use as directed. 50 g 1   loratadine (CLARITIN) 10 MG tablet Take 10 mg by mouth daily.     niacinamide 500 MG tablet Take 500 mg by mouth 2 (two) times daily.     Omega-3 Fatty Acids (FISH OIL PO) Take 1 capsule by mouth daily.     potassium chloride  SA (KLOR-CON  M) 20 MEQ tablet TAKE 1 TABLET BY MOUTH EVERY OTHER DAY 30 tablet 0   ribociclib  succ (KISQALI  400MG  DAILY DOSE) 200 MG Therapy Pack Take 2 tablets (400 mg total) by mouth daily. Take for 21 days on, 7 days off, repeat every 28 days. 42 tablet 1   Spacer/Aero-Holding Chambers (AEROCHAMBER MV) inhaler Use as instructed 1 each 0   No current facility-administered medications for this visit.   PHYSICAL EXAMINATION:   Vitals:   04/10/24 1450  BP: (!) 164/81  Pulse: 75  Resp: 18  Temp: (!) 96.7 F (35.9 C)  SpO2: 100%     Filed Weights    04/10/24 1450  Weight: 175 lb 9.6 oz (79.7 kg)     Physical Exam Vitals and nursing note reviewed.  HENT:     Head: Normocephalic and atraumatic.     Mouth/Throat:     Pharynx: Oropharynx is clear.  Eyes:     Extraocular Movements: Extraocular movements intact.     Pupils: Pupils are equal, round, and reactive to light.  Cardiovascular:     Rate and Rhythm: Normal rate and regular rhythm.     Heart sounds: Murmur heard.  Pulmonary:     Comments: Decreased breath sounds bilaterally.  Abdominal:  Palpations: Abdomen is soft.  Musculoskeletal:        General: Normal range of motion.     Cervical back: Normal range of motion.  Skin:    General: Skin is warm.  Neurological:     General: No focal deficit present.     Mental Status: She is alert and oriented to person, place, and time.  Psychiatric:        Behavior: Behavior normal.        Judgment: Judgment normal.      LABORATORY DATA:  I have reviewed the data as listed Lab Results  Component Value Date   WBC 4.0 04/10/2024   HGB 10.5 (L) 04/10/2024   HCT 31.4 (L) 04/10/2024   MCV 111.3 (H) 04/10/2024   PLT 173 04/10/2024   Recent Labs    02/12/24 1336 02/27/24 1433 04/10/24 1430  NA 135 137 138  K 4.1 4.3 4.4  CL 102 101 102  CO2 22 25 24   GLUCOSE 100* 167* 111*  BUN 23 29* 19  CREATININE 1.19* 1.27* 1.08*  CALCIUM  9.5 10.1 9.5  GFRNONAA 46* 42* 51*  PROT 6.8 6.7 6.7  ALBUMIN 4.0 3.8 4.2  AST 27 24 23   ALT 16 16 16   ALKPHOS 40 47 61  BILITOT 0.8 0.7 0.4    RADIOGRAPHIC STUDIES: I have personally reviewed the radiological images as listed and agreed with the findings in the report. MM 3D DIAGNOSTIC MAMMOGRAM BILATERAL BREAST Result Date: 04/08/2024 CLINICAL DATA:  Status post RIGHT lumpectomy with nipple excision for invasive mammary carcinoma in September of 2023 with adjuvant radiation. Negative margins, although 1 of 2 sentinel lymph nodes were positive for metastatic carcinoma. Patient is on  Arimidex . EXAM: DIGITAL DIAGNOSTIC BILATERAL MAMMOGRAM WITH TOMOSYNTHESIS AND CAD TECHNIQUE: Bilateral digital diagnostic mammography and breast tomosynthesis was performed. The images were evaluated with computer-aided detection. COMPARISON:  Previous exam(s). ACR Breast Density Category b: There are scattered areas of fibroglandular density. FINDINGS: There is density and architectural distortion within the RIGHT breast, corresponding to the site of prior lumpectomy. Spot compression magnification view(s) demonstrate no evidence of recurrent malignancy. Significant interval increase in dystrophic/rim calcifications involving much of the RIGHT breast, a benign finding. There are no suspicious masses, calcifications, or architectural distortion in EITHER breast. IMPRESSION: No mammographic evidence of malignancy in EITHER breast status post RIGHT breast lumpectomy. RECOMMENDATION: As the patient is now over 2 years out from her lumpectomy, she may return to annual screening mammography in 1 year. Given her history of breast cancer, she remains eligible for annual diagnostic mammography, if preferred. I have discussed the findings and recommendations with the patient. If applicable, a reminder letter will be sent to the patient regarding the next appointment. BI-RADS CATEGORY  2: Benign. Electronically Signed   By: Norleen Croak M.D.   On: 04/08/2024 11:26    ASSESSMENT & PLAN:   Carcinoma of overlapping sites of right breast in female, estrogen receptor positive (HCC) # Right breast cancer -[facility]; ER/PR positive Her 2 Negative ;  G-3; LVI + T2N1.  Stage II [OCT 2023; Dr.Byrnett] s/p Lumpectomy; node positive-Oncotype RS- 35- s/p  adjuvant chemotherapy with Taxotere -Cytoxan  every 3 weeks x4 cycles.  S/p postlumpectomy radiation April 3rd 2024. Poor tolerance to adjuvant abemaciclib  even at 50 mg BID [started second week of June- stopped in AUG 2025].SEP 2025- started Kisqali .   # currently on adjuvant CDK  inhibitor- Kisquali x3  years.  Continue Anastrazole x10 years. DEC  mammogram -2025- WNL. EKG-  no QT prolongation-   # Bone health-2023 osteopenic T-score of -1.1.Continue calcium  plus vitamin D .- ON [JAN 2025- #1 of planned- 6] adjuvant Zometa  every 6 months x 3 years. S/p dental evaluation - stable. Continue adjuvant Zometa - reduce the dose to 3 mg [given GFR 36]   # COPD [Dr.Dgayli]-on Breztri /albuterol   stable.   # [2nd,FEB 2024] CTA-  acute pulmonary embolism/ LEFT LE- Acute DVT-- [provoked sec to chemo]. Stopped eliquis  in End of  AUG 2024. Stable.    # CKD- stage III- monitor for now./ APRIL 2025- CA 10.4; ca+vitD on stable.    # Vaccination: s/p COVID; Flu; OK with RSV.   # Fatigue:care program: improved.   # IV Access: PIV   # Zometa  - 7/11-2023 q 6 m Deryl.decree ]  # DISPOSITION: # follow in 4 weeks- MD;labs- cbc/cmp; Mag-  Zometa  Dr.B       All questions were answered. The patient/family knows to call the clinic with any problems, questions or concerns.    Cindy JONELLE Joe, MD 04/10/2024 3:35 PM

## 2024-04-10 NOTE — Assessment & Plan Note (Addendum)
#   Right breast cancer -[facility]; ER/PR positive Her 2 Negative ;  G-3; LVI + T2N1.  Stage II [OCT 2023; Dr.Byrnett] s/p Lumpectomy; node positive-Oncotype RS- 35- s/p  adjuvant chemotherapy with Taxotere -Cytoxan  every 3 weeks x4 cycles.  S/p postlumpectomy radiation April 3rd 2024. Poor tolerance to adjuvant abemaciclib  even at 50 mg BID [started second week of June- stopped in AUG 2025].SEP 2025- started Kisqali .   # currently on adjuvant CDK inhibitor- Kisquali x3  years.  Continue Anastrazole x10 years. DEC  mammogram -2025- WNL. EKG- no QT prolongation-   # Bone health-2023 osteopenic T-score of -1.1.Continue calcium  plus vitamin D .- ON [JAN 2025- #1 of planned- 6] adjuvant Zometa  every 6 months x 3 years. S/p dental evaluation - stable. Continue adjuvant Zometa - reduce the dose to 3 mg [given GFR 36]   # COPD [Dr.Dgayli]-on Breztri /albuterol   stable.   # [2nd,FEB 2024] CTA-  acute pulmonary embolism/ LEFT LE- Acute DVT-- [provoked sec to chemo]. Stopped eliquis  in End of  AUG 2024. Stable.    # CKD- stage III- monitor for now./ APRIL 2025- CA 10.4; ca+vitD on stable.    # Vaccination: s/p COVID; Flu; OK with RSV.   # Fatigue:care program: improved.   # IV Access: PIV   # Zometa  - 7/11-2023 q 6 m [3mg ]  # DISPOSITION: # follow in 4 weeks- MD;labs- cbc/cmp; Mag-  Zometa  Dr.B

## 2024-04-10 NOTE — Progress Notes (Signed)
 PT  sch'd to have an EKG today.

## 2024-04-11 ENCOUNTER — Encounter

## 2024-04-16 ENCOUNTER — Encounter

## 2024-04-22 ENCOUNTER — Other Ambulatory Visit: Payer: Self-pay

## 2024-04-22 ENCOUNTER — Other Ambulatory Visit: Payer: Self-pay | Admitting: Internal Medicine

## 2024-04-22 DIAGNOSIS — C50811 Malignant neoplasm of overlapping sites of right female breast: Secondary | ICD-10-CM

## 2024-04-22 MED ORDER — RIBOCICLIB SUCC (400 MG DOSE) 200 MG PO TBPK
400.0000 mg | ORAL_TABLET | Freq: Every day | ORAL | 1 refills | Status: AC
  Filled 2024-04-22: qty 42, 28d supply, fill #0
  Filled 2024-05-20 – 2024-05-21 (×2): qty 42, 28d supply, fill #1

## 2024-04-22 NOTE — Progress Notes (Signed)
 Specialty Pharmacy Refill Coordination Note  Sabrina Wilson is a 83 y.o. female contacted today regarding refills of specialty medication(s) Ribociclib  Succinate (KISQALI  400mg  Daily Dose)   Patient requested Delivery   Delivery date: 05/01/24   Verified address: [address]  [place]   Medication will be filled on: 04/30/24   This fill date is pending response to refill request from provider. Patient is aware and if they have not received fill by intended date they must follow up with pharmacy.

## 2024-04-22 NOTE — Progress Notes (Signed)
 Specialty Pharmacy Ongoing Clinical Assessment Note  Sabrina Wilson is a 83 y.o. female who is being followed by the specialty pharmacy service for RxSp Oncology   Patient's specialty medication(s) reviewed today: Ribociclib  Succinate (KISQALI  400mg  Daily Dose)   Missed doses in the last 4 weeks: 0   Patient/Caregiver did not have any additional questions or concerns.   Therapeutic benefit summary: Patient is achieving benefit   Adverse events/side effects summary: Experienced adverse events/side effects (ongoing diarrhea , tolerable with Imodium OTC)   Patient's therapy is appropriate to: Continue    Goals Addressed             This Visit's Progress    Achieve a cure   On track    Patient is on track. Patient will maintain adherence.  Per office visit notes from 04/10/24, her recent mammogram was unremarkable.  She is on adjuvant therapy for 3 years.          Follow up: 3 months  Silvano LOISE Dolly Specialty Pharmacist

## 2024-04-23 ENCOUNTER — Encounter

## 2024-04-27 ENCOUNTER — Other Ambulatory Visit: Payer: Self-pay | Admitting: Internal Medicine

## 2024-04-29 ENCOUNTER — Encounter: Payer: Self-pay | Admitting: Internal Medicine

## 2024-04-30 ENCOUNTER — Other Ambulatory Visit: Payer: Self-pay

## 2024-04-30 ENCOUNTER — Encounter: Attending: Internal Medicine

## 2024-04-30 DIAGNOSIS — C50811 Malignant neoplasm of overlapping sites of right female breast: Secondary | ICD-10-CM | POA: Insufficient documentation

## 2024-04-30 DIAGNOSIS — Z170 Estrogen receptor positive status [ER+]: Secondary | ICD-10-CM | POA: Insufficient documentation

## 2024-04-30 NOTE — Progress Notes (Unsigned)
 Daily Session Note  Patient Details  Name: Sabrina Wilson MRN: [id number] Date of Birth: 1941/03/22 Referring Provider:   [facility] from 01/16/2024 in [facility] Cardiac and Pulmonary Rehab  Referring Provider Rennie Lighter, MD    Encounter Date: 04/30/2024  Check In:  Session Check In - 04/30/24 1238       Check-In   Supervising physician immediately available to respond to emergencies See telemetry face sheet for immediately available ER MD    Location [facility]-Cardiac & Pulmonary Rehab    Staff Present Rollene Paterson, MS, Exercise Physiologist    Virtual Visit No    Medication changes reported     No    Fall or balance concerns reported    No    Warm-up and Cool-down Performed on first and last piece of equipment    Resistance Training Performed Yes    VAD Patient? No    PAD/SET Patient? No      Pain Assessment   Multiple Pain Sites No             Tobacco Use History[1]  Goals Met:  Independence with exercise equipment Exercise tolerated well No report of concerns or symptoms today  Goals Unmet:  Not Applicable  Comments: Pt able to follow exercise prescription today without complaint.  Will continue to monitor for progression.    Dr. Oneil Pinal is Medical Director for [facility] Cardiac Rehabilitation.  Dr. Fuad Aleskerov is Medical Director for [facility] Pulmonary Rehabilitation.    [1]  Social History Tobacco Use  Smoking Status Former   Current packs/day: 0.00   Types: Cigarettes   Quit date: 10/12/2007   Years since quitting: 16.5  Smokeless Tobacco Never

## 2024-05-02 ENCOUNTER — Encounter

## 2024-05-02 VITALS — Ht 66.9 in | Wt 176.4 lb

## 2024-05-02 DIAGNOSIS — C50811 Malignant neoplasm of overlapping sites of right female breast: Secondary | ICD-10-CM

## 2024-05-02 NOTE — Patient Instructions (Signed)
 Discharge Patient Instructions  Patient Details  Name: Sabrina Wilson MRN: [id number] Date of Birth: 01/17/1941 Referring Provider:  Abbey Bruckner, MD   Number of Visits: 24  Reason for Discharge:  Patient reached a stable level of exercise. Patient independent in their exercise. Patient has met program and personal goals.  Smoking History:  Social History   Tobacco Use  Smoking Status Former   Current packs/day: 0.00   Types: Cigarettes   Quit date: 10/12/2007   Years since quitting: 16.5  Smokeless Tobacco Never    Diagnosis:  Carcinoma of overlapping sites of right breast in female, estrogen receptor positive (HCC)  Initial Exercise Prescription:  Initial Exercise Prescription - 01/16/24 1400       Date of Initial Exercise RX and Referring Provider   Date 01/16/24    Referring Provider Brahmanday, Govinda, MD      Oxygen   Maintain Oxygen Saturation 88% or higher      Recumbant Bike   Level 1    RPM 50    Watts 15    Minutes 15    METs 1.93      NuStep   Level 1    SPM 80    Minutes 15    METs 1.93      Biostep-RELP   Level 1    SPM 50    Minutes 15    METs 1.93      Track   Laps 24    Minutes 15    METs 2.31      Prescription Details   Frequency (times per week) 2    Duration Progress to 30 minutes of continuous aerobic without signs/symptoms of physical distress      Intensity   THRR 40-80% of Max Heartrate 96-125    Ratings of Perceived Exertion 11-13    Perceived Dyspnea 0-4      Progression   Progression Continue to progress workloads to maintain intensity without signs/symptoms of physical distress.      Resistance Training   Training Prescription Yes    Weight 2 lb    Reps 10-15          Discharge Exercise Prescription (Final Exercise Prescription Changes):  Exercise Prescription Changes - 02/29/24 1200       Home Exercise Plan   Plans to continue exercise at Home (comment)   Walking (hallway loop), weights,  exercises given, will also look at [facility] or [employer] for a gym option   Frequency Add 1 additional day to program exercise sessions.    Initial Home Exercises Provided 02/29/24          Functional Capacity:  6 Minute Walk     Row Name 01/16/24 1419 05/02/24 1243       6 Minute Walk   Phase Initial Discharge    Distance 1020 feet 1100 feet    Distance % Change -- 7.84 %    Distance Feet Change -- 80 ft    Walk Time 6 minutes 6 minutes    # of Rest Breaks 0 0    MPH 1.93 2.08    METS 1.82 2.1    RPE 12 11    Perceived Dyspnea  1 0    VO2 Peak 6.38 7.36    Symptoms Yes (comment) No    Comments Bilateral hip fatigue --    Resting HR 68 bpm 80 bpm    Resting BP 150/80 150/70    Resting Oxygen Saturation  95 % 96 %  Exercise Oxygen Saturation  during 6 min walk 98 % 97 %    Max Ex. HR 79 bpm 96 bpm    Max Ex. BP 160/70 160/82    2 Minute Post BP 160/78 150/70      Nutrition & Weight - Outcomes:  Pre Biometrics - 01/16/24 1425       Pre Biometrics   Height 5' 6.9 (1.699 m)    Weight 175 lb 9.6 oz (79.7 kg)    BMI (Calculated) 27.59    Single Leg Stand 5 seconds          Post Biometrics - 05/02/24 1244        Post  Biometrics   Height 5' 6.9 (1.699 m)    Weight 176 lb 6.4 oz (80 kg)    BMI (Calculated) 27.72    Single Leg Stand 6 seconds

## 2024-05-02 NOTE — Progress Notes (Signed)
 Daily Session Note  Patient Details  Name: Sabrina Wilson MRN: [id number] Date of Birth: 02-14-1941 Referring Provider:   [facility] & Exercise from 01/16/2024 in [facility] Cardiac and Pulmonary Rehab  Referring Provider Rennie Lighter, MD    Encounter Date: 05/02/2024  Check In:  Session Check In - 05/02/24 1241       Check-In   Supervising physician immediately available to respond to emergencies See telemetry face sheet for immediately available ER MD    Location [facility]-Cardiac & Pulmonary Rehab    Staff Present Rollene Paterson, MS, Exercise Physiologist    Virtual Visit No    Medication changes reported     No    Fall or balance concerns reported    No    Warm-up and Cool-down Performed on first and last piece of equipment    Resistance Training Performed Yes    VAD Patient? No    PAD/SET Patient? No      Pain Assessment   Currently in Pain? No/denies    Multiple Pain Sites No             Tobacco Use History[1]  Goals Met:  Independence with exercise equipment Exercise tolerated well No report of concerns or symptoms today  Goals Unmet:  Not Applicable  Comments: Pt able to follow exercise prescription today without complaint.  Will continue to monitor for progression.  Post 6 minute walk test was completed today.   6 Minute Walk     Row Name 01/16/24 1419 05/02/24 1243       6 Minute Walk   Phase Initial Discharge    Distance 1020 feet 1100 feet    Distance % Change -- 7.84 %    Distance Feet Change -- 80 ft    Walk Time 6 minutes 6 minutes    # of Rest Breaks 0 0    MPH 1.93 2.08    METS 1.82 2.1    RPE 12 11    Perceived Dyspnea  1 0    VO2 Peak 6.38 7.36    Symptoms Yes (comment) No    Comments Bilateral hip fatigue --    Resting HR 68 bpm 80 bpm    Resting BP 150/80 150/70    Resting Oxygen Saturation  95 % 96 %    Exercise Oxygen Saturation  during 6 min walk 98 % 97 %    Max Ex. HR 79 bpm 96 bpm    Max  Ex. BP 160/70 160/82    2 Minute Post BP 160/78 150/70         Dr. Oneil Pinal is Medical Director for [facility] Cardiac Rehabilitation.  Dr. Fuad Aleskerov is Medical Director for [facility] Pulmonary Rehabilitation.    [1]  Social History Tobacco Use  Smoking Status Former   Current packs/day: 0.00   Types: Cigarettes   Quit date: 10/12/2007   Years since quitting: 16.5  Smokeless Tobacco Never

## 2024-05-07 ENCOUNTER — Encounter

## 2024-05-07 DIAGNOSIS — C50811 Malignant neoplasm of overlapping sites of right female breast: Secondary | ICD-10-CM

## 2024-05-07 NOTE — Progress Notes (Signed)
 Daily Session Note  Patient Details  Name: Sabrina Wilson MRN: [id number] Date of Birth: [birth year] Referring Provider:   [facility] & Exercise from 01/16/2024 in [facility] Cardiac and Pulmonary Rehab  Referring Provider Rennie Lighter, MD    Encounter Date: 05/07/2024  Check In:  Session Check In - 05/07/24 1227       Check-In   Supervising physician immediately available to respond to emergencies See telemetry face sheet for immediately available ER MD    Location [facility]-Cardiac & Pulmonary Rehab    Staff Present Doyce Saling BS, Exercise Physiologist    Virtual Visit No    Medication changes reported     No    Fall or balance concerns reported    No    Warm-up and Cool-down Performed on first and last piece of equipment    Resistance Training Performed Yes    VAD Patient? No             Tobacco Use History[1]  Goals Met:  Independence with exercise equipment Exercise tolerated well No report of concerns or symptoms today Strength training completed today  Goals Unmet:  Not Applicable  Comments:  Sabrina Wilson graduated today from  rehab with 24 sessions completed.  Details of the patient's exercise prescription and what She needs to do in order to continue the prescription and progress were discussed with patient.  Patient was given a copy of prescription and goals.  Patient verbalized understanding. Sabrina Wilson plans to continue to exercise by joining the [facility].    Dr. Oneil Pinal is Medical Director for [facility] Cardiac Rehabilitation.  Dr. Fuad Aleskerov is Medical Director for [facility] Pulmonary Rehabilitation.    [1]  Social History Tobacco Use  Smoking Status Former   Current packs/day: 0.00   Types: Cigarettes   Quit date: 10/12/2007   Years since quitting: 16.5  Smokeless Tobacco Never

## 2024-05-08 ENCOUNTER — Inpatient Hospital Stay (HOSPITAL_BASED_OUTPATIENT_CLINIC_OR_DEPARTMENT_OTHER): Admitting: Internal Medicine

## 2024-05-08 ENCOUNTER — Encounter: Payer: Self-pay | Admitting: Internal Medicine

## 2024-05-08 ENCOUNTER — Inpatient Hospital Stay

## 2024-05-08 ENCOUNTER — Inpatient Hospital Stay: Attending: Internal Medicine

## 2024-05-08 VITALS — BP 158/70 | HR 71 | Temp 98.6°F | Resp 18 | Ht 66.9 in | Wt 176.0 lb

## 2024-05-08 DIAGNOSIS — C50811 Malignant neoplasm of overlapping sites of right female breast: Secondary | ICD-10-CM

## 2024-05-08 DIAGNOSIS — Z170 Estrogen receptor positive status [ER+]: Secondary | ICD-10-CM | POA: Diagnosis not present

## 2024-05-08 DIAGNOSIS — Z7969 Long term (current) use of other immunomodulators and immunosuppressants: Secondary | ICD-10-CM

## 2024-05-08 LAB — CMP (CANCER CENTER ONLY)
ALT: 13 U/L (ref 0–44)
AST: 21 U/L (ref 15–41)
Albumin: 4.2 g/dL (ref 3.5–5.0)
Alkaline Phosphatase: 55 U/L (ref 38–126)
Anion gap: 12 (ref 5–15)
BUN: 25 mg/dL — ABNORMAL HIGH (ref 8–23)
CO2: 22 mmol/L (ref 22–32)
Calcium: 10 mg/dL (ref 8.9–10.3)
Chloride: 105 mmol/L (ref 98–111)
Creatinine: 1.18 mg/dL — ABNORMAL HIGH (ref 0.44–1.00)
GFR, Estimated: 46 mL/min — ABNORMAL LOW
Glucose, Bld: 121 mg/dL — ABNORMAL HIGH (ref 70–99)
Potassium: 4.2 mmol/L (ref 3.5–5.1)
Sodium: 139 mmol/L (ref 135–145)
Total Bilirubin: 0.4 mg/dL (ref 0.0–1.2)
Total Protein: 6.5 g/dL (ref 6.5–8.1)

## 2024-05-08 LAB — CBC WITH DIFFERENTIAL (CANCER CENTER ONLY)
Abs Immature Granulocytes: 0.01 K/uL (ref 0.00–0.07)
Basophils Absolute: 0.1 K/uL (ref 0.0–0.1)
Basophils Relative: 2 %
Eosinophils Absolute: 0.1 K/uL (ref 0.0–0.5)
Eosinophils Relative: 3 %
HCT: 29.7 % — ABNORMAL LOW (ref 36.0–46.0)
Hemoglobin: 10.1 g/dL — ABNORMAL LOW (ref 12.0–15.0)
Immature Granulocytes: 0 %
Lymphocytes Relative: 27 %
Lymphs Abs: 1 K/uL (ref 0.7–4.0)
MCH: 39 pg — ABNORMAL HIGH (ref 26.0–34.0)
MCHC: 34 g/dL (ref 30.0–36.0)
MCV: 114.7 fL — ABNORMAL HIGH (ref 80.0–100.0)
Monocytes Absolute: 0.6 K/uL (ref 0.1–1.0)
Monocytes Relative: 15 %
Neutro Abs: 2 K/uL (ref 1.7–7.7)
Neutrophils Relative %: 53 %
Platelet Count: 162 K/uL (ref 150–400)
RBC: 2.59 MIL/uL — ABNORMAL LOW (ref 3.87–5.11)
RDW: 16.8 % — ABNORMAL HIGH (ref 11.5–15.5)
WBC Count: 3.7 K/uL — ABNORMAL LOW (ref 4.0–10.5)
nRBC: 0 % (ref 0.0–0.2)

## 2024-05-08 LAB — MAGNESIUM: Magnesium: 1.8 mg/dL (ref 1.7–2.4)

## 2024-05-08 MED ORDER — ALBUTEROL SULFATE HFA 108 (90 BASE) MCG/ACT IN AERS: 2.0000 | INHALATION_SPRAY | Freq: Four times a day (QID) | RESPIRATORY_TRACT | 1 refills | Status: AC | PRN

## 2024-05-08 MED ORDER — SODIUM CHLORIDE 0.9 % IV SOLN
Freq: Once | INTRAVENOUS | Status: AC
  Filled 2024-05-08: qty 250

## 2024-05-08 MED ORDER — ZOLEDRONIC ACID 4 MG/5ML IV CONC
3.0000 mg | Freq: Once | INTRAVENOUS | Status: AC
  Administered 2024-05-08: 3 mg via INTRAVENOUS
  Filled 2024-05-08: qty 3.75

## 2024-05-08 NOTE — Assessment & Plan Note (Addendum)
#   Right breast cancer -[facility]; ER/PR positive Her 2 Negative ;  G-3; LVI + T2N1.  Stage II [OCT 2023; Dr.Byrnett] s/p Lumpectomy; node positive-Oncotype RS- 35- s/p  adjuvant chemotherapy with Taxotere -Cytoxan  every 3 weeks x4 cycles.  S/p postlumpectomy radiation April 3rd 2024. Poor tolerance to adjuvant abemaciclib  even at 50 mg BID [started second week of June- stopped in AUG 2025].SEP 2025- started Kisqali .   # currently on adjuvant CDK inhibitor- Kisquali x3  years.  Continue Anastrazole x10 years. DEC  mammogram -2025- WNL. EKG- no QT prolongation- stable.    # Bone health-2023 osteopenic T-score of -1.1.SABRA- ON [JAN 2025- #1 of planned- 6] adjuvant Zometa  every 6 months x 3 years. S/p dental evaluation - stable. Continue adjuvant Zometa - reduce the dose to 3 mg [given prior GFR 36] -HOLD off calcium  plus vitamin D    # COPD [Dr.Dgayli]-on Breztri /albuterol -? Food allergy- ok to refill-.   # [2nd,FEB 2024] CTA-  acute pulmonary embolism/ LEFT LE- Acute DVT-- [provoked sec to chemo]. Stopped eliquis  in End of  AUG 2024. Stable.    # CKD- stage III- monitor for now./ APRIL 2025- CA 10.4;HOLD off ca+vitD    # Vaccination: s/p COVID; Flu; OK with RSV.   # Fatigue:care program: improved.   # IV Access: PIV   # Zometa  - 05/08/24-2025 q 6 m [3mg ]  # DISPOSITION: #  Zometa  today # follow in 4 weeks- MD;labs- cbc/cmp; Mag-  Dr.B

## 2024-05-08 NOTE — Progress Notes (Signed)
 Pt would like to know if she can get a refill on her albuterol  inhaler?(Not on her list) She used one at Christmas that was 3 months out of date. She received it when she had pneumonia. She has had the proventil  2.5 and ventolin  108 in the past.

## 2024-05-08 NOTE — Progress Notes (Signed)
 [facility] CONSULT NOTE  Patient Care Team: Bair, Kalpana, MD as PCP - General (Family Medicine) [name], MD as PCP - Cardiology (Cardiology) Dessa, [name], MD as Consulting Physician (General Surgery) [name], MD (Internal Medicine) [name], RN as Oncology Nurse Navigator [name], MD as Consulting Physician (Radiation Oncology) [name], MD as Consulting Physician (Oncology) [name], DO as Consulting Physician (General Surgery)  CHIEF COMPLAINTS/PURPOSE OF CONSULTATION: Breast cancer  #  Oncology History Overview Note  # 2009- Sigmoid colon cancer stage II (T3 N0 4 lymph nodes sampled M0 stage II high risk there was obstruction and perforation abscess; Dr.Hern), workup included a low CEA preoperatively and a chest x-ray and CT scan abdomen pelvis negative for metastatic disease, S/P colostomy; s/p FOLFOX x 6 months. [Dr.Gittin ]  # LATER REVERSED , K RAS  wild type B RAF not evaluated   Also initial iron deficiency with anemia thrombocytosis, high platelets considered reactive, workup includes a normal PCR for CML and a negative [id number] mutation  DIAGNOSIS:  A. BREAST, RIGHT; LUMPECTOMY:  - INVASIVE MAMMARY CARCINOMA.  - DUCTAL CARCINOMA IN SITU (DCIS).  - SEE CANCER SUMMARY BELOW.  - BIOPSY SITE CHANGE WITH CLIP (2).  - FIBROUS TISSUE WITH CYST FORMATION AND FLORID DUCTAL HYPERPLASIA.  - SKIN AND NIPPLE NEGATIVE FOR MALIGNANCY.  - VASCULAR CALCIFICATION.   B. SENTINEL LYMPH NODES 1 AND 2, RIGHT AXILLA; EXCISION:  - METASTATIC CARCINOMA INVOLVES ONE OF TWO LYMPH NODES (1/2).   C. NON-SENTINEL LYMPH NODES, RIGHT AXILLA; EXCISION:  - ONE LYMPH NODE NEGATIVE FOR MALIGNANCY (0/1).   CANCER CASE SUMMARY: INVASIVE CARCINOMA OF THE BREAST  Standard(s): AJCC-UICC 8   SPECIMEN  Procedure: Lumpectomy  Specimen Laterality: Right   TUMOR  Histologic Type: Invasive carcinoma of no special type (ductal)   Histologic Grade (Nottingham Histologic Score)       Glandular (Acinar)/Tubular Differentiation: 3       Nuclear Pleomorphism: 3       Mitotic Rate: 3       Overall Grade: 3  Tumor Size: 24 mm  Tumor Focality: Single focus of invasive carcinoma  Ductal Carcinoma In Situ (DCIS): Present, nuclear grade 2-3  Tumor Extent: Not applicable  Lymphatic and/or Vascular Invasion: Present  Treatment Effect in the Breast: No known presurgical therapy   MARGINS  Margin Status for Invasive Carcinoma: All margins negative for invasive  carcinoma       Distance from closest margin: 10 mm       Specify closest margin: Superior   Margin Status for DCIS: All margins negative for DCIS       Distance from DCIS to closest margin: 10 mm       Specify closest margin: Superior   REGIONAL LYMPH NODES  Regional Lymph Node Status: Tumor present in regional lymph node(s)       Number of Lymph Nodes with Macrometastases (greater than 2 mm): 1       Number of Lymph Nodes with Micrometastases (greater than 0.2 mm to  2 mm and/or greater than 200 cells): 0       Number of Lymph Nodes with Isolated Tumor Cells (0.2 mm or less OR  200 cells or less): 0       Size of Largest Metastatic Deposit: 3 mm      Extranodal Extension: Not identified       Total Number of Lymph Nodes Examined (sentinel and non-sentinel): 3  Number of Sentinel Nodes Examined: 2   DISTANT METASTASIS  Distant Site(s) Involved, if applicable: Not applicable   PATHOLOGIC STAGE CLASSIFICATION (pTNM, AJCC 8th Edition):  Modified Classification: Not applicable  pT Category: pT2  T Suffix: Not applicable  pN Category: pN1a  N Suffix: (sn)  pM Category: Not applicable   SPECIAL STUDIES  Breast Biomarker Testing Performed on Previous Outside Biopsy:  23-250-T11  Per outside report:  Estrogen Receptor (ER) Status: POSITIVE          Percentage of cells with nuclear positivity: Greater than 90%          Average intensity of staining:  Strong   Progesterone Receptor (PgR) Status: POSITIVE          Percentage of cells with nuclear positivity: 60%          Average intensity of staining: Strong   HER2 (by immunohistochemistry): EQUIVOCAL (Score 2+)  HER2 FISH: NEGATIVE   Ki-67: Not performed   # Cytoxan  Taxotere  x 4 cycles; finished JAN, 2024. [Neutropenic fevers s/p cycle #4;   -FEB 2nd, 2024-right lung PE; left lower LE  DVT- on eliquis .   # FEB 19th-start RT; mpectomy radiation April 3rd 2024.  # anastrozole  once a day [April 2024]-t # Poor tolerance to adjuvant abemaciclib  100 mg BID [started second week of June] stopped x 1 month.    # JAN 8th 2024- start abemaciclib  50 mg BID- STOPPED in AUG 2025- sec to intolerance.   # LATE SEP 2025- start RIBOCICLIB  400 mg 3 w- on and 1 week OFF      Cancer of sigmoid (HCC) (Resolved)  Carcinoma of overlapping sites of right breast in female, estrogen receptor positive (HCC)  02/14/2022 Initial Diagnosis   Carcinoma of overlapping sites of right breast in female, estrogen receptor positive (HCC)   02/14/2022 Cancer Staging   Staging form: Breast, AJCC 8th Edition - Pathologic: Stage IIA (pT2, pN1, cM0, G3, ER+, PR+, HER2-) - Signed by [name], MD on 02/14/2022 Histologic grading system: 3 grade system   02/23/2022 -  Chemotherapy   Patient is on Treatment Plan : BREAST TC q21d      HISTORY OF PRESENTING ILLNESS:   Alone.  Ambulating independently.   Sabrina Wilson 84 y.o.  female patient with ER/PR positive Her 2 Negative  breast cancer stage II T2 N1-currently  adjuvant anastrozole -and kisqali    is here for a follow up.    Discussed the use of AI scribe software for clinical note transcription with the patient, who gave verbal consent to proceed.  History of Present Illness   Sabrina Wilson is an 84 year old female with estrogen receptor-positive right breast cancer on adjuvant ribociclib  and letrozole who presents for oncology follow-up and  management of ongoing therapies.  She is currently receiving adjuvant ribociclib  and letrozole for estrogen receptor-positive right breast cancer. She reports occasional mild diarrhea associated with ribociclib , but otherwise feels improved on her current regimen. She denies new or worsening oncologic symptoms and reports no additional adverse effects from ribociclib  or letrozole.  She experienced an episode of acute dyspnea on the Saturday after Christmas, occurring shortly after eating dinner. The episode was unresponsive to Breztri  inhaler but resolved within approximately one hour after use of an expired albuterol  inhaler. Oxygen saturation remained normal during the episode. She has had similar episodes in the past, though not for over a year, and requests a refill of her albuterol  inhaler for rescue use.  She last received Zemaira  infusion for alpha-1 antitrypsin deficiency in July of the previous year. Her creatinine has been in the 40s and her calcium  normalized after discontinuing calcium  and vitamin D  supplementation. Her vitamin D  level was reported as normal.      Review of Systems  Constitutional:  Positive for malaise/fatigue. Negative for chills, diaphoresis and fever.  HENT:  Negative for nosebleeds and sore throat.   Eyes:  Negative for double vision.  Respiratory:  Negative for hemoptysis, sputum production and wheezing.   Cardiovascular:  Negative for chest pain, palpitations, orthopnea and leg swelling.  Gastrointestinal:  Negative for blood in stool, constipation, diarrhea, heartburn, melena and vomiting.  Genitourinary:  Negative for dysuria, frequency and urgency.  Musculoskeletal:  Positive for back pain and joint pain.  Skin: Negative.  Negative for itching and rash.  Neurological:  Negative for dizziness, tingling, focal weakness, weakness and headaches.  Endo/Heme/Allergies:  Does not bruise/bleed easily.  Psychiatric/Behavioral:  Negative for depression. The patient  is not nervous/anxious and does not have insomnia.      MEDICAL HISTORY:  Past Medical History:  Diagnosis Date   Actinic keratosis 02/12/2020   R lat calf (hypertrophic)   Arthritis    Cancer of sigmoid (HCC) 06/17/2016   Colon cancer (HCC) 2009   T3,N0; s/p resection  Dr. Primus and chemotherapy by Dr. Brooks   Diastolic dysfunction    a. 08/2020 Echo: EF 60-65%, no rwma, Gr1 DD, nl RV size/fxn. Mild MR.   DVT (deep venous thrombosis) (HCC) 05/29/2022   Hernia of flank    History of actinic keratoses 08/11/2020   Bx proven at left lateral ankle proximal, LN2 10/08/20   History of stress test    a. 09/2020 MV: EF >65%. No ischemia/infarct. Mild Ao Ca2+ and minimal Cor Ca2+.   Hypertension    Hypomagnesemia 05/29/2022   Hyponatremia 05/29/2022   Neuropathy    Personal history of radiation therapy 2023   right breast CA   Polyp at cervical os    s/p resection Dr. Cleotilde   Pulmonary embolism (HCC) 05/27/2022   Skin cancer 01/22/2020   surface of an atypical verrucous squamous proliferation    Squamous cell carcinoma of skin 07/16/2020   Left lateral ankle anterior, [facility] with [facility] 08-11-2020    SURGICAL HISTORY: Past Surgical History:  Procedure Laterality Date   BREAST BIOPSY Right 05/2014   Dr. Fredirick office-benign   BREAST LUMPECTOMY WITH SENTINEL LYMPH NODE BIOPSY Right 01/21/2022   Procedure: BREAST LUMPECTOMY WITH SENTINEL LYMPH NODE BX;  Surgeon: Dessa [name], MD;  Location: [facility] ORS;  Service: General;  Laterality: Right;   BREAST SURGERY Right February 2016   Vacuum assisted biopsy for recurrent cyst, fibrocystic changes without evidence of malignancy.   CATARACT EXTRACTION W/PHACO Left 01/13/2015   Procedure: CATARACT EXTRACTION PHACO AND INTRAOCULAR LENS PLACEMENT (IOC);  Surgeon: Elsie Carmine, MD;  Location: [facility] ORS;  Service: Ophthalmology;  Laterality: Left;  US   1:02.9 AP   19.2 CDE  12.06 casette lot #  [id number]   CATARACT EXTRACTION W/PHACO Right  02/03/2015   Procedure: CATARACT EXTRACTION PHACO AND INTRAOCULAR LENS PLACEMENT (IOC);  Surgeon: Elsie Carmine, MD;  Location: [facility] ORS;  Service: Ophthalmology;  Laterality: Right;  us00:53 ap46.5 [id number]   COLECTOMY     COLONOSCOPY  2015   Dr Ora   COLONOSCOPY WITH PROPOFOL  N/A 09/13/2017   Procedure: COLONOSCOPY WITH PROPOFOL ;  Surgeon: Dessa [name], MD;  Location: [facility] ENDOSCOPY;  Service: Endoscopy;  Laterality: N/A;  COLONOSCOPY WITH PROPOFOL  N/A 11/08/2017   Procedure: COLONOSCOPY WITH PROPOFOL ;  Surgeon: Dessa [name], MD;  Location: [facility] ENDOSCOPY;  Service: Endoscopy;  Laterality: N/A;   colostomy reversal     DILATION AND CURETTAGE OF UTERUS  2014   Dr. Cleotilde, benign per pt   EYE SURGERY     HERNIA REPAIR  07/13/12   ventral    SKIN CANCER EXCISION     TONSILLECTOMY      SOCIAL HISTORY: Social History   Socioeconomic History   Marital status: Married    Spouse name: Not on file   Number of children: Not on file   Years of education: Not on file   Highest education level: 12th grade  Occupational History   Not on file  Tobacco Use   Smoking status: Former    Current packs/day: 0.00    Types: Cigarettes    Quit date: 10/12/2007    Years since quitting: 16.5   Smokeless tobacco: Never  Vaping Use   Vaping status: Never Used  Substance and Sexual Activity   Alcohol use: Yes    Alcohol/week: 1.0 standard drink of alcohol    Types: 1 Standard drinks or equivalent per week    Comment: with dinner GLASS OF WINE EACH DAY   Drug use: No   Sexual activity: Not on file  Other Topics Concern   Not on file  Social History Narrative   Lives in [place] with husband. 2 children, son lives nearby.Diet - regularExercise - none. 30 mins/in [place]. Quit smoking in 2009. Wine before dinner. Pianist in church; real estate; diplomatic services operational officer.    Social Drivers of Health   Tobacco Use: Medium Risk (05/08/2024)   Patient History    Smoking Tobacco Use: Former     Smokeless Tobacco Use: Never    Passive Exposure: Not on file  Financial Resource Strain: Low Risk (11/23/2023)   Overall Financial Resource Strain (CARDIA)    Difficulty of Paying Living Expenses: Not hard at all  Food Insecurity: No Food Insecurity (11/23/2023)   Epic    Worried About Programme Researcher, Broadcasting/film/video in the Last Year: Never true    Ran Out of Food in the Last Year: Never true  Transportation Needs: No Transportation Needs (11/23/2023)   Epic    Lack of Transportation (Medical): No    Lack of Transportation (Non-Medical): No  Physical Activity: Insufficiently Active (11/23/2023)   Exercise Vital Sign    Days of Exercise per Week: 1 day    Minutes of Exercise per Session: 30 min  Stress: No Stress Concern Present (11/23/2023)   [employer] of Occupational Health - Occupational Stress Questionnaire    Feeling of Stress: Not at all  Social Connections: Socially Integrated (11/23/2023)   Social Connection and Isolation Panel    Frequency of Communication with Friends and Family: More than three times a week    Frequency of Social Gatherings with Friends and Family: Three times a week    Attends Religious Services: More than 4 times per year    Active Member of Clubs or Organizations: Yes    Attends Banker Meetings: More than 4 times per year    Marital Status: Married  Catering Manager Violence: Not At Risk (11/27/2023)   Epic    Fear of Current or Ex-Partner: No    Emotionally Abused: No    Physically Abused: No    Sexually Abused: No  Depression (PHQ2-9): Low Risk (05/08/2024)   Depression (PHQ2-9)  PHQ-2 Score: 0  Alcohol Screen: Low Risk (11/23/2023)   Alcohol Screen    Last Alcohol Screening Score (AUDIT): 3  Housing: Unknown (11/23/2023)   Epic    Unable to Pay for Housing in the Last Year: No    Number of Times Moved in the Last Year: Not on file    Homeless in the Last Year: No  Utilities: Not At Risk (11/27/2023)   Epic    Threatened with loss of  utilities: No  Health Literacy: Adequate Health Literacy (11/27/2023)   B1300 Health Literacy    Frequency of need for help with medical instructions: Never    FAMILY HISTORY: Family History  Problem Relation Age of Onset   Stroke Mother    Diabetes Mother    Colon cancer Father        dx 74s   Stroke Sister    Colon cancer Brother        dx 36s-70s   Brain cancer Brother        dx 60s-70s   Breast cancer Neg Hx     ALLERGIES:  is allergic to sulfa antibiotics.  MEDICATIONS:  Current Outpatient Medications  Medication Sig Dispense Refill   albuterol  (VENTOLIN  HFA) 108 (90 Base) MCG/ACT inhaler Inhale 2 puffs into the lungs every 6 (six) hours as needed for wheezing or shortness of breath. 1 each 1   anastrozole  (ARIMIDEX ) 1 MG tablet TAKE 1 TABLET BY MOUTH DAILY 90 tablet 0   atorvastatin  (LIPITOR) 10 MG tablet Take 1 tablet (10 mg total) by mouth daily. 90 tablet 3   budesonide-glycopyrrolate -formoterol (BREZTRI  AEROSPHERE) 160-9-4.8 MCG/ACT AERO inhaler Inhale 2 puffs into the lungs in the morning and at bedtime. 1 each 11   furosemide  (LASIX ) 20 MG tablet TAKE 1 TABLET BY MOUTH EVERY OTHER DAY 30 tablet 1   halobetasol  (ULTRAVATE ) 0.05 % cream Apply topically to aa's of rash at body twice daily on weekends only as needed. Avoid applying to face, groin, and axilla. Use as directed. 50 g 1   loratadine (CLARITIN) 10 MG tablet Take 10 mg by mouth daily.     niacinamide 500 MG tablet Take 500 mg by mouth 2 (two) times daily.     Omega-3 Fatty Acids (FISH OIL PO) Take 1 capsule by mouth daily.     potassium chloride  SA (KLOR-CON  M) 20 MEQ tablet TAKE 1 TABLET BY MOUTH EVERY OTHER DAY 30 tablet 0   ribociclib  succ (KISQALI  400MG  DAILY DOSE) 200 MG Therapy Pack Take 2 tablets (400 mg total) by mouth daily. Take for 21 days on, 7 days off, repeat every 28 days. 42 tablet 1   Spacer/Aero-Holding Chambers (AEROCHAMBER MV) inhaler Use as instructed 1 each 0   No current  facility-administered medications for this visit.   Facility-Administered Medications Ordered in Other Visits  Medication Dose Route Frequency Provider Last Rate Last Admin   0.9 %  sodium chloride  infusion   Intravenous Once Cobin Cadavid R, MD       zoledronic  acid (ZOMETA ) 3 mg in sodium chloride  0.9 % 100 mL IVPB  3 mg Intravenous Once Mccall Lomax R, MD       PHYSICAL EXAMINATION:   Vitals:   05/08/24 1257 05/08/24 1322  BP: (!) 158/75 (!) 158/70  Pulse: 71   Resp: 18   Temp: 98.6 F (37 C)   SpO2: 99%      Filed Weights   05/08/24 1257  Weight: 176 lb (79.8 kg)  Physical Exam Vitals and nursing note reviewed.  HENT:     Head: Normocephalic and atraumatic.     Mouth/Throat:     Pharynx: Oropharynx is clear.  Eyes:     Extraocular Movements: Extraocular movements intact.     Pupils: Pupils are equal, round, and reactive to light.  Cardiovascular:     Rate and Rhythm: Normal rate and regular rhythm.     Heart sounds: Murmur heard.  Pulmonary:     Comments: Decreased breath sounds bilaterally.  Abdominal:     Palpations: Abdomen is soft.  Musculoskeletal:        General: Normal range of motion.     Cervical back: Normal range of motion.  Skin:    General: Skin is warm.  Neurological:     General: No focal deficit present.     Mental Status: She is alert and oriented to person, place, and time.  Psychiatric:        Behavior: Behavior normal.        Judgment: Judgment normal.      LABORATORY DATA:  I have reviewed the data as listed Lab Results  Component Value Date   WBC 3.7 (L) 05/08/2024   HGB 10.1 (L) 05/08/2024   HCT 29.7 (L) 05/08/2024   MCV 114.7 (H) 05/08/2024   PLT 162 05/08/2024   Recent Labs    02/27/24 1433 04/10/24 1430 05/08/24 1303  NA 137 138 139  K 4.3 4.4 4.2  CL 101 102 105  CO2 25 24 22   GLUCOSE 167* 111* 121*  BUN 29* 19 25*  CREATININE 1.27* 1.08* 1.18*  CALCIUM  10.1 9.5 10.0  GFRNONAA 42* 51* 46*   PROT 6.7 6.7 6.5  ALBUMIN 3.8 4.2 4.2  AST 24 23 21   ALT 16 16 13   ALKPHOS 47 61 55  BILITOT 0.7 0.4 0.4    RADIOGRAPHIC STUDIES: I have personally reviewed the radiological images as listed and agreed with the findings in the report. No results found.   ASSESSMENT & PLAN:   Carcinoma of overlapping sites of right breast in female, estrogen receptor positive (HCC) # Right breast cancer -[facility]; ER/PR positive Her 2 Negative ;  G-3; LVI + T2N1.  Stage II [OCT 2023; Dr.Byrnett] s/p Lumpectomy; node positive-Oncotype RS- 35- s/p  adjuvant chemotherapy with Taxotere -Cytoxan  every 3 weeks x4 cycles.  S/p postlumpectomy radiation April 3rd 2024. Poor tolerance to adjuvant abemaciclib  even at 50 mg BID [started second week of June- stopped in AUG 2025].SEP 2025- started Kisqali .   # currently on adjuvant CDK inhibitor- Kisquali x3  years.  Continue Anastrazole x10 years. DEC  mammogram -2025- WNL. EKG- no QT prolongation- stable.    # Bone health-2023 osteopenic T-score of -1.1.SABRA- ON [JAN 2025- #1 of planned- 6] adjuvant Zometa  every 6 months x 3 years. S/p dental evaluation - stable. Continue adjuvant Zometa - reduce the dose to 3 mg [given prior GFR 36] -HOLD off calcium  plus vitamin D    # COPD [Dr.Dgayli]-on Breztri /albuterol -? Food allergy- ok to refill-.   # [2nd,FEB 2024] CTA-  acute pulmonary embolism/ LEFT LE- Acute DVT-- [provoked sec to chemo]. Stopped eliquis  in End of  AUG 2024. Stable.    # CKD- stage III- monitor for now./ APRIL 2025- CA 10.4;HOLD off ca+vitD    # Vaccination: s/p COVID; Flu; OK with RSV.   # Fatigue:care program: improved.   # IV Access: PIV   # Zometa  - 05/08/24-2025 q 6 m [3mg ]  # DISPOSITION: #  Zometa   today # follow in 4 weeks- MD;labs- cbc/cmp; Mag-  Dr.B   All questions were answered. The patient/family knows to call the clinic with any problems, questions or concerns.    Cindy JONELLE Joe, MD 05/08/2024 1:59 PM

## 2024-05-20 ENCOUNTER — Other Ambulatory Visit: Payer: Self-pay

## 2024-05-21 ENCOUNTER — Other Ambulatory Visit: Payer: Self-pay

## 2024-05-22 ENCOUNTER — Other Ambulatory Visit: Payer: Self-pay

## 2024-05-23 ENCOUNTER — Other Ambulatory Visit: Payer: Self-pay | Admitting: Pharmacy Technician

## 2024-05-23 ENCOUNTER — Other Ambulatory Visit: Payer: Self-pay

## 2024-05-23 NOTE — Progress Notes (Signed)
 Specialty Pharmacy Refill Coordination Note  Sabrina Wilson is a 84 y.o. female contacted today regarding refills of specialty medication(s) Ribociclib  Succinate (KISQALI  400mg  Daily Dose)   Patient requested Delivery   Delivery date: 05/24/24   Verified address: [address]  [place]    Medication will be filled on: 05/23/24

## 2024-05-24 ENCOUNTER — Other Ambulatory Visit: Payer: Self-pay

## 2024-05-31 ENCOUNTER — Other Ambulatory Visit: Payer: Self-pay | Admitting: Internal Medicine

## 2024-06-03 ENCOUNTER — Inpatient Hospital Stay: Admitting: Internal Medicine

## 2024-06-03 ENCOUNTER — Inpatient Hospital Stay: Attending: Internal Medicine

## 2024-10-03 ENCOUNTER — Ambulatory Visit: Admitting: Radiation Oncology

## 2024-10-07 ENCOUNTER — Encounter

## 2024-11-27 ENCOUNTER — Ambulatory Visit

## 2025-01-07 ENCOUNTER — Ambulatory Visit: Admitting: Dermatology
